# Patient Record
Sex: Male | Born: 1965
Health system: Southern US, Community
[De-identification: ages and names within clinical notes are randomized; demographics above are authoritative.]

## PROBLEM LIST (undated history)

## (undated) DIAGNOSIS — G8929 Other chronic pain: Secondary | ICD-10-CM

## (undated) DIAGNOSIS — F32A Depression, unspecified: Secondary | ICD-10-CM

## (undated) DIAGNOSIS — G709 Myoneural disorder, unspecified: Secondary | ICD-10-CM

## (undated) DIAGNOSIS — F419 Anxiety disorder, unspecified: Secondary | ICD-10-CM

## (undated) DIAGNOSIS — T7840XA Allergy, unspecified, initial encounter: Secondary | ICD-10-CM

## (undated) DIAGNOSIS — F200 Paranoid schizophrenia: Secondary | ICD-10-CM

## (undated) DIAGNOSIS — H409 Unspecified glaucoma: Secondary | ICD-10-CM

## (undated) DIAGNOSIS — F329 Major depressive disorder, single episode, unspecified: Secondary | ICD-10-CM

## (undated) HISTORY — DX: Anxiety disorder, unspecified: F41.9

## (undated) HISTORY — DX: Allergy, unspecified, initial encounter: T78.40XA

## (undated) HISTORY — PX: EPIDURAL BLOCK INJECTION: SHX1516

## (undated) HISTORY — DX: Myoneural disorder, unspecified: G70.9

## (undated) HISTORY — DX: Unspecified glaucoma: H40.9

## (undated) HISTORY — DX: Paranoid schizophrenia: F20.0

## (undated) HISTORY — PX: ANKLE ARTHROSCOPY: SUR85

---

## 2010-08-29 HISTORY — PX: OTHER SURGICAL HISTORY: SHX169

## 2010-09-02 ENCOUNTER — Ambulatory Visit
Admission: RE | Admit: 2010-09-02 | Discharge: 2010-09-02 | Payer: Self-pay | Source: Home / Self Care | Attending: Orthopedic Surgery | Admitting: Orthopedic Surgery

## 2010-09-02 LAB — GLUCOSE, CAPILLARY
Glucose-Capillary: 95 mg/dL (ref 70–99)
Glucose-Capillary: 83 mg/dL (ref 70–99)

## 2010-09-02 LAB — POCT HEMOGLOBIN-HEMACUE: Hemoglobin: 14.3 g/dL (ref 13.0–17.0)

## 2010-11-08 LAB — BASIC METABOLIC PANEL
BUN: 10 mg/dL (ref 6–23)
CO2: 25 mEq/L (ref 19–32)
Calcium: 9.4 mg/dL (ref 8.4–10.5)
Chloride: 109 mEq/L (ref 96–112)
Creatinine, Ser: 1.06 mg/dL (ref 0.4–1.5)
GFR calc Af Amer: >60 mL/min (ref >60)
GFR calc non Af Amer: >60 mL/min (ref >60)
Glucose, Bld: 62 mg/dL — ABNORMAL LOW (ref 70–99)
Potassium: 4 mEq/L (ref 3.5–5.1)
Sodium: 141 mEq/L (ref 135–145)

## 2011-01-27 ENCOUNTER — Ambulatory Visit: Payer: Medicare Other | Attending: Diagnostic Neuroimaging

## 2011-01-27 DIAGNOSIS — IMO0001 Reserved for inherently not codable concepts without codable children: Secondary | ICD-10-CM | POA: Insufficient documentation

## 2011-01-27 DIAGNOSIS — M545 Low back pain, unspecified: Secondary | ICD-10-CM | POA: Insufficient documentation

## 2011-01-27 DIAGNOSIS — M2569 Stiffness of other specified joint, not elsewhere classified: Secondary | ICD-10-CM | POA: Insufficient documentation

## 2011-02-01 ENCOUNTER — Ambulatory Visit: Payer: Medicare Other | Attending: Diagnostic Neuroimaging | Admitting: Physical Therapy

## 2011-02-01 DIAGNOSIS — M2569 Stiffness of other specified joint, not elsewhere classified: Secondary | ICD-10-CM | POA: Insufficient documentation

## 2011-02-01 DIAGNOSIS — M545 Low back pain, unspecified: Secondary | ICD-10-CM | POA: Insufficient documentation

## 2011-02-01 DIAGNOSIS — IMO0001 Reserved for inherently not codable concepts without codable children: Secondary | ICD-10-CM | POA: Insufficient documentation

## 2011-02-03 ENCOUNTER — Ambulatory Visit: Payer: Medicare Other

## 2011-02-07 ENCOUNTER — Ambulatory Visit: Payer: Medicare Other

## 2011-02-09 ENCOUNTER — Ambulatory Visit: Payer: Medicare Other | Admitting: Physical Therapy

## 2011-02-14 ENCOUNTER — Ambulatory Visit: Payer: Medicare Other | Admitting: Physical Therapy

## 2011-02-16 ENCOUNTER — Ambulatory Visit: Payer: Medicare Other | Admitting: Physical Therapy

## 2011-03-01 ENCOUNTER — Ambulatory Visit: Payer: Medicare Other | Attending: Diagnostic Neuroimaging

## 2011-03-01 DIAGNOSIS — M545 Low back pain, unspecified: Secondary | ICD-10-CM | POA: Insufficient documentation

## 2011-03-01 DIAGNOSIS — IMO0001 Reserved for inherently not codable concepts without codable children: Secondary | ICD-10-CM | POA: Insufficient documentation

## 2011-03-04 ENCOUNTER — Ambulatory Visit: Payer: Medicare Other

## 2011-03-08 ENCOUNTER — Ambulatory Visit: Payer: Medicare Other

## 2011-03-11 ENCOUNTER — Ambulatory Visit: Payer: Medicare Other

## 2011-03-21 ENCOUNTER — Encounter: Payer: Medicare Other | Admitting: Physical Therapy

## 2011-03-21 ENCOUNTER — Ambulatory Visit: Payer: Medicare Other

## 2011-03-22 ENCOUNTER — Encounter: Payer: Medicare Other | Admitting: Physical Therapy

## 2011-06-29 ENCOUNTER — Ambulatory Visit: Payer: Medicare Other | Attending: Orthopedic Surgery | Admitting: Physical Therapy

## 2011-06-29 DIAGNOSIS — M25669 Stiffness of unspecified knee, not elsewhere classified: Secondary | ICD-10-CM | POA: Insufficient documentation

## 2011-06-29 DIAGNOSIS — IMO0001 Reserved for inherently not codable concepts without codable children: Secondary | ICD-10-CM | POA: Insufficient documentation

## 2011-06-29 DIAGNOSIS — M25569 Pain in unspecified knee: Secondary | ICD-10-CM | POA: Insufficient documentation

## 2011-07-01 ENCOUNTER — Ambulatory Visit: Payer: Medicare Other | Attending: Orthopedic Surgery | Admitting: Physical Therapy

## 2011-07-01 DIAGNOSIS — M25669 Stiffness of unspecified knee, not elsewhere classified: Secondary | ICD-10-CM | POA: Insufficient documentation

## 2011-07-01 DIAGNOSIS — IMO0001 Reserved for inherently not codable concepts without codable children: Secondary | ICD-10-CM | POA: Insufficient documentation

## 2011-07-01 DIAGNOSIS — M25569 Pain in unspecified knee: Secondary | ICD-10-CM | POA: Insufficient documentation

## 2011-07-05 ENCOUNTER — Ambulatory Visit: Payer: Medicare Other | Admitting: Physical Therapy

## 2011-07-07 ENCOUNTER — Ambulatory Visit: Payer: Medicare Other | Admitting: Physical Therapy

## 2011-07-11 ENCOUNTER — Ambulatory Visit: Payer: Medicare Other | Admitting: Physical Therapy

## 2011-07-15 ENCOUNTER — Ambulatory Visit: Payer: Medicare Other | Admitting: Physical Therapy

## 2011-08-19 ENCOUNTER — Ambulatory Visit: Payer: Medicare Other | Admitting: Physical Medicine & Rehabilitation

## 2011-08-19 ENCOUNTER — Encounter: Payer: Medicare Other | Attending: Physical Medicine & Rehabilitation

## 2011-08-19 DIAGNOSIS — E119 Type 2 diabetes mellitus without complications: Secondary | ICD-10-CM | POA: Insufficient documentation

## 2011-08-19 DIAGNOSIS — R209 Unspecified disturbances of skin sensation: Secondary | ICD-10-CM | POA: Insufficient documentation

## 2011-08-19 NOTE — Consult Note (Signed)
REASON FOR VISIT:  Bilateral arm and hand numbness.  Consult requested by Dr. Teressa Senter.  HISTORY:  A 45 year old male who had problems initially with right upper extremity numbness in the hand and the forearm area as well as elbow starting the last week in February 2011.  He had EMG and CV showing ulnar nerve compression at the cubital tunnel.  Median nerves were reported as normal.  There is no evidence of polyneuropathy at that time.  This was done in 2011.  He eventually underwent a decompression of the right ulnar nerve without transposition on September 02, 2010.  He had no postoperative complications.  He had normal strength and sensation, started developing left-sided ulnar type symptoms, small finger numbness and now the patient states this has progressed over time to involve the whole hand.  The patient is a diabetic, but states his hemoglobin A1c are in the normal range now.  At 1 point, they were around 8 in 2010, but gradually regained a good control.  He has undergone neurological evaluation.  I found him B12 level slightly low at 177, start on B12 injections.  He states his pain is about 4/10 described as sharp, stabbing and tingling pain.  This is mainly episodic and has to do with position of his arms, certainly hyperflexion of his elbows provokes this.  He also states that using a laptop will do this, but if he has his ergonomic set up on his desktop, he can be on the computer a bit longer.  The patient is a self-admitted game addict, but no longer does video gaming.  He used to spend many hours a day on the computer.  MEDICATIONS:  Nabumetone, glipizide, metformin, Geodon, pravastatin, Lyrica, Wellbutrin, clonazepam and Singulair.  ALLERGIES:  AMOXICILLIN.  PSYCHIATRIC HISTORY:  Positive for depression, anxiety and paranoia.  He has a Therapist, sports.  In addition, he is on disability for this.  He has been on psychiatric disability since 2003.  He has not worked jobs  since June 18, 2011.  REVIEW OF SYSTEMS:  Also, positive for some low blood sugars.  He states that he has gotten "control of his diabetes."  SOCIAL HISTORY:  He is single, living with his mother.  OBJECTIVE:  Blood pressure 113/69, pulse 82, respiratory rate 16 and O2 sat 94% on room air.  Weight 210 pounds.  Height 5 feet 11 inches.  He states at 1 point, he was about 230 or maybe even 240 pounds. Examination, in general, no acute distress.  Mood and affect appropriate, but does appear anxious.  His neck range of motion is full without pain.  His upper extremity strength is normal.  His upper extremity range of motion normal in the upper extremity.  Deep tendon reflexes are normal.  He has no tenderness over the ulnar groove.  No tenderness over the wrist except over the distal radial aspect.  He has a mildly positive Finkelstein maneuver bilaterally.  He has hypertrophy of his thumb extensor muscles in the distal forearm of the right upper extremity.  His lower extremities have normal sensation, normal strength, normal deep tendon reflexes.  His gait is normal.  His back has no tenderness to palpation.  He has full lumbar spine range of motion.  IMPRESSION:  Upper extremity paresthesias.  Certainly, it sounds suspicious for ulnar neuropathy at the elbow.  This appears to be mainly positional in nature, but very distressing to the patient.  Given his history of diabetes, he is also at risk for  median nerve compression at the wrist and certainly he has not had a EMG and CV since July 19, 2011.  In addition, he has some lower extremity symptomatology such as numbness in the toes mainly in the sural distribution, but no objective findings.  Certainly, we could check nerve conductions in the lower extremity as well.  RECOMMENDATION: 1. He has been on Lyrica for a couple years.  We will wean him off of     it starting at 100 b.i.d. for a week and then 100 daily for a week.      In the meantime, we will start him on gabapentin 300 mg at bedtime     for a week, b.i.d. for a week, t.i.d. for a week, then q.i.d.  I     went over the side effects. 2. Overall, I do not think he is a good candidate for narcotic     analgesic medications.  First of all, his pain seems to be more     mechanical and positional.  Second of all, he is on multiple     psychiatric medications, which may interact. 3. Discussed my recommendations with the patient, he agrees with the     plan. 4. If EMG is not revealing, he may consider neuromuscular ultrasound     to see if he has got any subluxation of these ulnar nerve out of     the ulnar groove or for her to have some impingement of the triceps     tendon into the ulnar groove.     Erick Colace, M.D. Electronically Signed    [facility]K/Med[facility] D:08/19/2011 14:11:47  T:08/19/2011 21:36:36  Job #:  [id number]  cc:   Katy Fitch. Sypher, M.D. Fax: [phone]

## 2011-09-19 ENCOUNTER — Encounter: Payer: Medicare Other | Attending: Physical Medicine & Rehabilitation

## 2011-09-19 ENCOUNTER — Ambulatory Visit: Payer: Medicare Other | Admitting: Physical Medicine & Rehabilitation

## 2011-09-19 DIAGNOSIS — R209 Unspecified disturbances of skin sensation: Secondary | ICD-10-CM | POA: Insufficient documentation

## 2011-09-19 DIAGNOSIS — E119 Type 2 diabetes mellitus without complications: Secondary | ICD-10-CM | POA: Insufficient documentation

## 2011-09-28 ENCOUNTER — Other Ambulatory Visit: Payer: Self-pay | Admitting: Physical Medicine & Rehabilitation

## 2011-09-28 DIAGNOSIS — M5410 Radiculopathy, site unspecified: Secondary | ICD-10-CM

## 2011-10-04 ENCOUNTER — Ambulatory Visit (HOSPITAL_COMMUNITY)
Admission: RE | Admit: 2011-10-04 | Discharge: 2011-10-04 | Disposition: A | Discharge status: Home / Self Care | Payer: Medicare Other | Source: Ambulatory Visit | Attending: Physical Medicine & Rehabilitation | Admitting: Physical Medicine & Rehabilitation

## 2011-10-04 DIAGNOSIS — R209 Unspecified disturbances of skin sensation: Secondary | ICD-10-CM | POA: Insufficient documentation

## 2011-10-04 DIAGNOSIS — M5410 Radiculopathy, site unspecified: Secondary | ICD-10-CM

## 2011-10-18 ENCOUNTER — Telehealth: Payer: Self-pay | Admitting: *Deleted

## 2011-10-18 NOTE — Telephone Encounter (Signed)
Patient called 10/17/11 asking should he have surgery on his left elbow? MD answered yes since it helped his right elbow. Asked why is his left foot numb. MD answered not sure, would require some additional testing to determine. Left patient msg to [facility]. No specific info left on msg due to "generic" VM

## 2011-10-19 ENCOUNTER — Telehealth: Payer: Self-pay | Admitting: *Deleted

## 2011-10-19 ENCOUNTER — Telehealth: Payer: Self-pay | Admitting: Physical Medicine & Rehabilitation

## 2011-10-19 NOTE — Telephone Encounter (Signed)
Pt aware that he needs to call the doctor that did his previous surgery on his R elbow.

## 2011-10-19 NOTE — Telephone Encounter (Signed)
Also need to arrange surgery on left elbow.

## 2011-10-19 NOTE — Telephone Encounter (Signed)
Should he have surgery on L elbow? What causes numbness in L foot?  Dr. Wynn Banker was consulted on this and he says that surgery was be benifical because it helped his R elbow. He is not sure what would cause his numbness in his foot, additional testing would need to find out what's going on. Pt aware of this information.

## 2011-11-14 ENCOUNTER — Ambulatory Visit (HOSPITAL_BASED_OUTPATIENT_CLINIC_OR_DEPARTMENT_OTHER): Payer: Medicare Other | Admitting: Physical Medicine & Rehabilitation

## 2011-11-14 ENCOUNTER — Encounter: Payer: Medicare Other | Attending: Physical Medicine & Rehabilitation

## 2011-11-14 ENCOUNTER — Encounter: Payer: Self-pay | Admitting: Physical Medicine & Rehabilitation

## 2011-11-14 VITALS — BP 113/68 | HR 83 | Resp 16 | Ht 72.0 in | Wt 203.0 lb

## 2011-11-14 DIAGNOSIS — R209 Unspecified disturbances of skin sensation: Secondary | ICD-10-CM | POA: Insufficient documentation

## 2011-11-14 DIAGNOSIS — G5620 Lesion of ulnar nerve, unspecified upper limb: Secondary | ICD-10-CM

## 2011-11-14 DIAGNOSIS — E119 Type 2 diabetes mellitus without complications: Secondary | ICD-10-CM | POA: Insufficient documentation

## 2011-11-14 MED ORDER — GABAPENTIN 400 MG PO CAPS: 400.0000 mg | ORAL_CAPSULE | Freq: Four times a day (QID) | ORAL | Status: DC

## 2011-11-14 NOTE — Patient Instructions (Signed)
Gabapentin capsules or tablets What is this medicine? GABAPENTIN (GA ba pen tin) is used to control partial seizures in adults with epilepsy. It is also used to treat certain types of nerve pain. This medicine may be used for other purposes; ask your health care provider or pharmacist if you have questions. What should I tell my health care provider before I take this medicine? They need to know if you have any of these conditions: -kidney disease -suicidal thoughts, plans, or attempt; a previous suicide attempt by you or a family member -an unusual or allergic reaction to gabapentin, other medicines, foods, dyes, or preservatives -pregnant or trying to get pregnant -breast-feeding How should I use this medicine? Take this medicine by mouth. Swallow it with a drink of water. Follow the directions on the prescription label. If this medicine upsets your stomach, take it with food or milk. Take your medicine at regular intervals. Do not take it more often than directed. If you are directed to break the 600 or 800 mg tablets in half as part of your dose, the extra half tablet should be used for the next dose. If you have not used the extra half tablet within 3 days, it should be thrown away. A special MedGuide will be given to you by the pharmacist with each prescription and refill. Be sure to read this information carefully each time. Talk to your pediatrician regarding the use of this medicine in children. Special care may be needed. Overdosage: If you think you have taken too much of this medicine contact a poison control center or emergency room at once. NOTE: This medicine is only for you. Do not share this medicine with others. What if I miss a dose? If you miss a dose, take it as soon as you can. If it is almost time for your next dose, take only that dose. Do not take double or extra doses. What may interact with this medicine? -antacids -hydrocodone -morphine -naproxen -sevelamer This  list may not describe all possible interactions. Give your health care provider a list of all the medicines, herbs, non-prescription drugs, or dietary supplements you use. Also tell them if you smoke, drink alcohol, or use illegal drugs. Some items may interact with your medicine. What should I watch for while using this medicine? Visit your doctor or health care professional for regular checks on your progress. You may want to keep a record at home of how you feel your condition is responding to treatment. You may want to share this information with your doctor or health care professional at each visit. You should contact your doctor or health care professional if your seizures get worse or if you have any new types of seizures. Do not stop taking this medicine or any of your seizure medicines unless instructed by your doctor or health care professional. Stopping your medicine suddenly can increase your seizures or their severity. Wear a medical identification bracelet or chain if you are taking this medicine for seizures, and carry a card that lists all your medications. You may get drowsy, dizzy, or have blurred vision. Do not drive, use machinery, or do anything that needs mental alertness until you know how this medicine affects you. To reduce dizzy or fainting spells, do not sit or stand up quickly, especially if you are an older patient. Alcohol can increase drowsiness and dizziness. Avoid alcoholic drinks. Your mouth may get dry. Chewing sugarless gum or sucking hard candy, and drinking plenty of water will help.   The use of this medicine may increase the chance of suicidal thoughts or actions. Pay special attention to how you are responding while on this medicine. Any worsening of mood, or thoughts of suicide or dying should be reported to your health care professional right away. Women who become pregnant while using this medicine may enroll in the [place] American Antiepileptic Drug Pregnancy Registry  by calling 1-888-233-2334. This registry collects information about the safety of antiepileptic drug use during pregnancy. What side effects may I notice from receiving this medicine? Side effects that you should report to your doctor or health care professional as soon as possible: -allergic reactions like skin rash, itching or hives, swelling of the face, lips, or tongue -worsening of mood, thoughts or actions of suicide or dying Side effects that usually do not require medical attention (report to your doctor or health care professional if they continue or are bothersome): -constipation -difficulty walking or controlling muscle movements -nausea -slurred speech -tremors -weight gain This list may not describe all possible side effects. Call your doctor for medical advice about side effects. You may report side effects to FDA at 1-800-FDA-1088. Where should I keep my medicine? Keep out of reach of children. Store at room temperature between 15 and 30 degrees C (59 and 86 degrees F). Throw away any unused medicine after the expiration date. NOTE: This sheet is a summary. It may not cover all possible information. If you have questions about this medicine, talk to your doctor, pharmacist, or health care provider.  2012, Elsevier/Gold Standard. (04/13/2010 6:06:26 PM) 

## 2011-11-14 NOTE — Progress Notes (Signed)
  Subjective:    Patient ID: Glenn Robertson, male    DOB: 08/30/1965, 46 y.o.   MRN: [id number]  HPI Patient still has bilateral hand numbness and left lateral foot numbness. EMG/MCV was normal in the lower extremity but showed some residual ulnar neuropathy in the right upper extremity and moderate left ulnar neuropathy. The cervical MRI showed no evidence of stenosis. Patient reports he had a lumbar MRI last summer but I do not have these results. He states he is on disability from his psychiatric illness but still takes classes at [facility] [employer] [facility]. does not feel he is getting much relief from his Neurontin at 300 mg.  Pain Inventory Average Pain 6 Pain Right Now 2 My pain is intermittent, constant, sharp, dull, tingling and aching  In the last 24 hours, has pain interfered with the following? General activity 7 Relation with others 3 Enjoyment of life 6 What TIME of day is your pain at its worst? All Day Sleep (in general) Fair  Pain is worse with: bending and some activites Pain improves with: rest and pacing activities Relief from Meds: 0  Mobility ability to climb steps?  yes do you drive?  yes  Function disabled: date disabled 2003  Neuro/Psych numbness tingling depression anxiety  Prior Studies Any changes since last visit?  no  Physicians involved in your care Any changes since last visit?  no       Review of Systems  Constitutional: Negative.   HENT: Negative.   Eyes: Negative.   Respiratory: Negative.   Cardiovascular: Negative.   Gastrointestinal: Negative.   Genitourinary: Negative.   Musculoskeletal: Negative.   Skin: Negative.   Neurological: Negative.   Hematological: Negative.   Psychiatric/Behavioral: Negative.        Objective:   Physical Exam  Constitutional: He is oriented to person, place, and time. He appears well-developed and well-nourished.  Musculoskeletal:       Left ankle: Normal.  Neurological: He is alert and oriented to person,  place, and time. He has normal strength. No sensory deficit.  Psychiatric: He has a normal mood and affect. His speech is normal and behavior is normal. Judgment and thought content normal. He exhibits abnormal remote memory.          Assessment & Plan:  1. Ulnar neuropathy left side greater than right side. He has undergone release of the right ulnar nerve in January 2012. Recent EMG showed that there is just a borderline slowing on the right side but moderate slowing on the left side.  2. Left foot numbness mainly along the lateral border. Will ask patient to get the MRI ordered by neurology last summer so I can review this. We'll schedule an appointment to go over this together.  3. Diabetes mellitus maintain strict control last EMG showed no evidence of neuropathy. 4. B12 deficiency continue supplementation. No signs of neuropathy on EMG 5. Chronic pain syndrome appears to be neuropathic, will increase his Neurontin to 400 mg 4 times per day.

## 2011-11-16 ENCOUNTER — Telehealth: Payer: Self-pay | Admitting: *Deleted

## 2011-11-16 NOTE — Telephone Encounter (Signed)
MRI lumbar spine report of 01/14/11 reviewed by Dr Wynn Banker. May benefit from left sacral-1 trans LESI. Questions answered. Will mail pamphlet to Glenn Robertson and await his decision to schedule LESI.

## 2011-11-18 ENCOUNTER — Telehealth: Payer: Self-pay | Admitting: Physical Medicine & Rehabilitation

## 2011-11-18 NOTE — Telephone Encounter (Signed)
Would like to go ahead with the shot Dr proposed.  Should he move it up or what?

## 2011-12-13 ENCOUNTER — Telehealth: Payer: Self-pay | Admitting: Physical Medicine & Rehabilitation

## 2011-12-13 NOTE — Telephone Encounter (Signed)
Pt aware that it depends on each individual case if there are complications or not.

## 2011-12-13 NOTE — Telephone Encounter (Signed)
What are the potential complications of this shot?

## 2011-12-15 ENCOUNTER — Encounter: Payer: Medicare Other | Attending: Physical Medicine & Rehabilitation

## 2011-12-15 ENCOUNTER — Encounter: Payer: Self-pay | Admitting: Physical Medicine & Rehabilitation

## 2011-12-15 ENCOUNTER — Ambulatory Visit (HOSPITAL_BASED_OUTPATIENT_CLINIC_OR_DEPARTMENT_OTHER): Payer: Medicare Other | Admitting: Physical Medicine & Rehabilitation

## 2011-12-15 DIAGNOSIS — IMO0002 Reserved for concepts with insufficient information to code with codable children: Secondary | ICD-10-CM | POA: Insufficient documentation

## 2011-12-15 DIAGNOSIS — E119 Type 2 diabetes mellitus without complications: Secondary | ICD-10-CM | POA: Insufficient documentation

## 2011-12-15 DIAGNOSIS — R209 Unspecified disturbances of skin sensation: Secondary | ICD-10-CM | POA: Insufficient documentation

## 2011-12-15 NOTE — Patient Instructions (Signed)
Epidural Steroid Injection Care After  Refer to this sheet in the next few weeks. These instructions provide you with information on caring for yourself after your procedure. Your caregiver may also give you more specific instructions. Your treatment has been planned according to current medical practices, but problems sometimes occur. Call your caregiver if you have any problems or questions after your procedure. HOME CARE INSTRUCTIONS   Avoid the use of heat on the injection site for a day.   Do not have a tub bath or soak in water for the rest of the day.   Remove the bandage on the next day.   Resume your normal activities on the next day.   Use ice packs or mild pain relievers to reduce the soreness around the injection site.   Follow up with your caregiver 7 to 10 days after the procedure.  SEEK MEDICAL CARE IF:   You develop a fever of more than 100.5 F (38.1 C).   You continue to have pain and soreness over the injection site even after taking medicines.   You develop significant nausea or vomiting.  SEEK IMMEDIATE MEDICAL CARE IF:   You have severe back pain, which is not relieved by medicines.   You develop severe headache, stiff neck, or sensitivity to light.   You develop any new numbness or weakness of your legs.   You lose control over your bladder or bowel movements.   You develop a fever of more than 102 F (38.9 C).   You develop difficulty breathing.  Document Released: 11/30/2010 Document Revised: 08/04/2011 Document Reviewed: 11/30/2010 ExitCare Patient Information 2012 [employer]. 

## 2011-12-15 NOTE — Progress Notes (Signed)
Lumbar transforaminal epidural steroid injection under fluoroscopic guidance the left S1  Indication: Lumbosacral radiculitis is not relieved by medication management or other conservative care and interfering with self-care and mobility.   Informed consent was obtained after describing risk and benefits of the procedure with the patient, this includes bleeding, bruising, infection, paralysis and medication side effects.  The patient wishes to proceed and has given written consent.  Patient was placed in prone position.  The lumbar area was marked and prepped with Betadine.  It was entered with a 25-gauge 1-1/2 inch needle and one mL of 1% lidocaine was injected into the skin and subcutaneous tissue.  Then a 22-gauge 3.5 inch spinal needle was inserted into the left S1  sacral foramen under AP, lateral, and oblique view.  Then a solution containing one mL of 10 mg per mL dexamethasone and 2 mL of 1% lidocaine was injected.  The patient tolerated procedure well.  Post procedure instructions were given.  Please see post procedure form.

## 2011-12-15 NOTE — Progress Notes (Signed)
  PROCEDURE RECORD [facility]   Name: Glenn Robertson DOB:31-Dec-1965 MRN: [id number]  Date:12/15/2011  Physician: Claudette Laws, MD    Nurse/CMA: Kelli Churn, CMA (AAMA)  Allergies:  Allergies  Allergen Reactions  . Amoxicillin     Consent Signed: yes  Is patient diabetic? yes   Pregnant: no LMP: No LMP for male patient. (age 45-55)  Anticoagulants: no Anti-inflammatory: no Antibiotics: no  Procedure: LESI   Position: Prone Start Time: 2:21 pm  End Time: 2:25 pm  Fluoro Time: 9  RN/CMA Derward Marple Carroll,CMA    Time 1:42 pm 2:28 pm    BP 117/76 118/61    Pulse 96 92    Respirations 14 16    O2 Sat 96 99    S/S 6 6    Pain Level 8 8     D/C home with Glenn Robertson (mother), patient A & O X 3, D/C instructions reviewed, and sits independently.

## 2011-12-19 ENCOUNTER — Telehealth: Payer: Self-pay | Admitting: *Deleted

## 2011-12-19 NOTE — Telephone Encounter (Signed)
Pt was not in ER on the 21st. He states that he didn't have to run after his 22 month old nephew and he thinks this numbness may be coming from that.

## 2011-12-19 NOTE — Telephone Encounter (Signed)
Had an ESI on 12/15/11. Having numbness in L foot, more than usual. Is this normal?

## 2011-12-19 NOTE — Telephone Encounter (Signed)
I see where the patient was in the emergency department on 12/18/2011 with a seizure. This certainly could have aggravated his lumbar radiculopathy. The epidural injection does not typically cause increased numbness other than temporarily while the lidocaine is working. That would be in the first few hours after the injection. If the patient has any increased weakness I conservatively reevaluate him this week.

## 2011-12-26 ENCOUNTER — Telehealth: Payer: Self-pay

## 2011-12-26 ENCOUNTER — Telehealth: Payer: Self-pay | Admitting: Physical Medicine & Rehabilitation

## 2011-12-26 NOTE — Telephone Encounter (Signed)
Spoke with pt advised him to not over do his PT and to maybe wait a little longer before he gets back on his bike.  He will call if the pain does not get better to be re-evaluated.

## 2011-12-26 NOTE — Telephone Encounter (Signed)
Patient sates he had set back on Friday, he was moving something he should not have moved.  Is he able to do PT prior to injection?  Can he ride his bicycle?

## 2012-01-03 ENCOUNTER — Telehealth: Payer: Self-pay | Admitting: Physical Medicine & Rehabilitation

## 2012-01-03 NOTE — Telephone Encounter (Signed)
I would recommend Physical therapy.  Please let me know if he wants to pursue

## 2012-01-03 NOTE — Telephone Encounter (Signed)
Having problem with back and numbness in foot.  It won't go away.  Is there any hope for him?  Please call.

## 2012-01-03 NOTE — Telephone Encounter (Signed)
Please advise 

## 2012-01-04 NOTE — Telephone Encounter (Signed)
Pt aware of Dr. Wynn Banker recommendations

## 2012-01-10 ENCOUNTER — Encounter: Payer: Medicare Other | Attending: Physical Medicine & Rehabilitation

## 2012-01-10 ENCOUNTER — Ambulatory Visit: Payer: Medicare Other | Admitting: Physical Medicine & Rehabilitation

## 2012-01-10 ENCOUNTER — Encounter: Payer: Self-pay | Admitting: Physical Medicine & Rehabilitation

## 2012-01-10 ENCOUNTER — Ambulatory Visit (HOSPITAL_BASED_OUTPATIENT_CLINIC_OR_DEPARTMENT_OTHER): Payer: Medicare Other | Admitting: Physical Medicine & Rehabilitation

## 2012-01-10 VITALS — BP 114/52 | HR 104 | Resp 16 | Ht 69.25 in | Wt 206.0 lb

## 2012-01-10 DIAGNOSIS — R209 Unspecified disturbances of skin sensation: Secondary | ICD-10-CM | POA: Insufficient documentation

## 2012-01-10 DIAGNOSIS — E119 Type 2 diabetes mellitus without complications: Secondary | ICD-10-CM | POA: Insufficient documentation

## 2012-01-10 DIAGNOSIS — M5430 Sciatica, unspecified side: Secondary | ICD-10-CM

## 2012-01-10 NOTE — Progress Notes (Signed)
  Subjective:    Patient ID: Glenn Robertson, male    DOB: 1965/12/14, 46 y.o.   MRN: [id number]  HPI Left lateral foot pain improved for about one week after S1 injection. He feels like he has a similar problem in the right foot. He's had no new injuries. He is scheduled for an ulnar nerve release with Dr. Cleone Slim Cypher on 01/19/2012. He like to exercise more. We discussed exercise plans including bicycling which is more low impact as well as a jog walk alternating regimen. He prefers running to biking. Pain Inventory Average Pain "hard to say-depends on what I am doing" Pain Right Now 2 My pain is intermittent, sharp, dull and tingling  In the last 24 hours, has pain interfered with the following? General activity 5 Relation with others 5 Enjoyment of life 8 What TIME of day is your pain at its worst? can be at any time Sleep (in general) Fair  Pain is worse with: some activites Pain improves with: rest and pacing activities Relief from Meds: some  Mobility walk without assistance  Function disabled: date disabled 2003  Neuro/Psych depression anxiety  Prior Studies Any changes since last visit?  no  Physicians involved in your care Any changes since last visit?  no      Review of Systems  Constitutional:       High blood sugar  All other systems reviewed and are negative.       Objective:   Physical Exam  Musculoskeletal:       Lumbar back: Normal. He exhibits no tenderness and no deformity.  Neurological: He has normal strength. He displays no atrophy. No sensory deficit. He exhibits normal muscle tone. Gait normal.  Psychiatric: His affect is blunt and inappropriate. He is slowed.      Assessment & Plan:  1. Chronic low back pain he has some symptoms suggestive of S1 nerve root compression. He was seen by neurology last year and an MRI showed a bulging disc although I do not have that actual report. 2. Ulnar nerve paresthesias has hadright ulnar nerve release  one year ago. He is scheduled for left ulnar nerve release in 1 week. He will followup with his surgeon postoperatively. I'll see the patient back in 2 months. We discussed exercise programs If his lower extremity discomfort progresses over the next couple months we will order an MRI. I would like him to recover from his ulnar nerve surgery he has had no bowel or bladder complaints no weakness

## 2012-01-10 NOTE — Patient Instructions (Addendum)
I will see you back in 2 months to assess your back and leg pain. If it has gotten worse we will order an MRI at that time. Good luck with your ulnar nerve surgery I think you can resume a jog walk regimen. You can start out with walking 5 minutes jogging for 1 minute and then adding 1 minutes 2 year jogging times each week. I would recommend not exceeding 5 days per week. Every other day may be better in your situation

## 2012-01-16 ENCOUNTER — Encounter (HOSPITAL_BASED_OUTPATIENT_CLINIC_OR_DEPARTMENT_OTHER)
Admission: RE | Admit: 2012-01-16 | Discharge: 2012-01-16 | Disposition: A | Discharge status: Home / Self Care | Payer: Medicare Other | Source: Ambulatory Visit | Attending: Orthopedic Surgery | Admitting: Orthopedic Surgery

## 2012-01-16 ENCOUNTER — Other Ambulatory Visit: Payer: Self-pay | Admitting: Orthopedic Surgery

## 2012-01-16 ENCOUNTER — Encounter (HOSPITAL_BASED_OUTPATIENT_CLINIC_OR_DEPARTMENT_OTHER): Payer: Self-pay | Admitting: *Deleted

## 2012-01-16 LAB — BASIC METABOLIC PANEL
BUN: 12 mg/dL (ref 6–23)
CO2: 25 mEq/L (ref 19–32)
Calcium: 9.2 mg/dL (ref 8.4–10.5)
Chloride: 104 mEq/L (ref 96–112)
Creatinine, Ser: 0.95 mg/dL (ref 0.50–1.35)
GFR calc Af Amer: >90 mL/min (ref >90)
GFR calc non Af Amer: >90 mL/min (ref >90)
Glucose, Bld: 156 mg/dL — ABNORMAL HIGH (ref 70–99)
Potassium: 3.6 mEq/L (ref 3.5–5.1)
Sodium: 139 mEq/L (ref 135–145)

## 2012-01-16 NOTE — Progress Notes (Signed)
To come in for [id number] Had rt ulnar nerve last yr

## 2012-01-18 NOTE — H&P (Signed)
  Glenn Robertson is an 46 y.o. male.   Chief Complaint: c/o chronic and progressive numbness and tingling left hand ulnar distribution HPI: .  Glenn Robertson is a 46 year-old disabled gentleman who has had type II diabetes for eleven years.  He reports that his last hemoglobin A1C was 6.1. He has neuropathy in his feet.  He has had numbness in both hands aggravated by use of his cell phone.  He has recently moved from [place], [place] to [place].  He has had some neck soreness and stiffness, but no radicular type symptoms.  He has full motion of his cervical spine. He underwent surgical decompression of his right ulnar nerve about one year ago and did well post-op. On initial evaluation he was found to have significant ulnar nerve compression at is elbow bilaterally. He now requests surgery to the left elbow due to continued symptoms.  Past Medical History  Diagnosis Date  . Diabetes mellitus   . Neuromuscular disorder   . Allergy   . Asthma   . Chronic pain   . Depression     Past Surgical History  Procedure Date  . Arm surgery 1/12    rt ulnar nerve decompression  . Epidural block injection     several    Family History  Problem Relation Age of Onset  . Diabetes Father   . Diabetes Paternal Uncle    Social History:  reports that he has never smoked. He has never used smokeless tobacco. He reports that he does not drink alcohol or use illicit drugs.  Allergies:  Allergies  Allergen Reactions  . Amoxicillin     No prescriptions prior to admission    No results found for this or any previous visit (from the past 48 hour(s)).  No results found.   Pertinent items are noted in HPI.  There were no vitals taken for this visit.  General appearance: alert Head: Normocephalic, without obvious abnormality Neck: supple, symmetrical, trachea midline Resp: clear to auscultation bilaterally Cardio: regular rate and rhythm GI: normal findings: bowel sounds normal Extremities:  Examination of the left elbow reveals a positive Tinel's and positive hyperflexion test. He has mildly weak intrinsic strength when compared with the right side. There is no atrophy noted. Review of his nerve conduction studies reveals delayed onset latencies and slowed conduction velocities across the cubital tunnel. Pulses: 2+ and symmetric Skin: normal Neurologic: Grossly normal    Assessment/Plan Impression: Left elbow ulnar nerve compression  Plan: To the OR for ulnar nerve decompression vs.transposition left elbow. The procedure, risks and post-op course were discussed with the patient at length and he was in agreement with the plan.  DASNOIT,Corinn Stoltzfus J 01/18/2012, 5:02 PM    H&P documentation: 01/19/2012  -History and Physical Reviewed  -Patient has been re-examined  -No change in the plan of care  Wyn Forster, MD

## 2012-01-19 ENCOUNTER — Encounter (HOSPITAL_BASED_OUTPATIENT_CLINIC_OR_DEPARTMENT_OTHER): Payer: Self-pay | Admitting: Orthopedic Surgery

## 2012-01-19 ENCOUNTER — Encounter (HOSPITAL_BASED_OUTPATIENT_CLINIC_OR_DEPARTMENT_OTHER)
Admission: RE | Disposition: A | Discharge status: Home / Self Care | Payer: Self-pay | Source: Ambulatory Visit | Attending: Orthopedic Surgery

## 2012-01-19 ENCOUNTER — Encounter (HOSPITAL_BASED_OUTPATIENT_CLINIC_OR_DEPARTMENT_OTHER): Payer: Self-pay | Admitting: Certified Registered"

## 2012-01-19 ENCOUNTER — Ambulatory Visit (HOSPITAL_BASED_OUTPATIENT_CLINIC_OR_DEPARTMENT_OTHER)
Admission: RE | Admit: 2012-01-19 | Discharge: 2012-01-19 | Disposition: A | Discharge status: Home / Self Care | Payer: Medicare Other | Source: Ambulatory Visit | Attending: Orthopedic Surgery | Admitting: Orthopedic Surgery

## 2012-01-19 ENCOUNTER — Encounter (HOSPITAL_BASED_OUTPATIENT_CLINIC_OR_DEPARTMENT_OTHER): Payer: Self-pay | Admitting: *Deleted

## 2012-01-19 ENCOUNTER — Ambulatory Visit (HOSPITAL_BASED_OUTPATIENT_CLINIC_OR_DEPARTMENT_OTHER): Payer: Medicare Other | Admitting: Certified Registered"

## 2012-01-19 DIAGNOSIS — Z01810 Encounter for preprocedural cardiovascular examination: Secondary | ICD-10-CM | POA: Insufficient documentation

## 2012-01-19 DIAGNOSIS — G5620 Lesion of ulnar nerve, unspecified upper limb: Secondary | ICD-10-CM | POA: Insufficient documentation

## 2012-01-19 DIAGNOSIS — Z01812 Encounter for preprocedural laboratory examination: Secondary | ICD-10-CM | POA: Insufficient documentation

## 2012-01-19 DIAGNOSIS — E119 Type 2 diabetes mellitus without complications: Secondary | ICD-10-CM | POA: Insufficient documentation

## 2012-01-19 DIAGNOSIS — J45909 Unspecified asthma, uncomplicated: Secondary | ICD-10-CM | POA: Insufficient documentation

## 2012-01-19 HISTORY — DX: Depression, unspecified: F32.A

## 2012-01-19 HISTORY — DX: Other chronic pain: G89.29

## 2012-01-19 HISTORY — DX: Major depressive disorder, single episode, unspecified: F32.9

## 2012-01-19 HISTORY — PX: ULNAR NERVE TRANSPOSITION: SHX2595

## 2012-01-19 LAB — GLUCOSE, CAPILLARY: Glucose-Capillary: 150 mg/dL — ABNORMAL HIGH (ref 70–99)

## 2012-01-19 LAB — POCT HEMOGLOBIN-HEMACUE: Hemoglobin: 13.6 g/dL (ref 13.0–17.0)

## 2012-01-19 SURGERY — ULNAR NERVE DECOMPRESSION/TRANSPOSITION
Anesthesia: General | Site: Elbow | Laterality: Left | Wound class: Clean

## 2012-01-19 MED ORDER — OXYCODONE-ACETAMINOPHEN 5-325 MG PO TABS: 1.0000 | ORAL_TABLET | ORAL | Status: AC | PRN

## 2012-01-19 MED ORDER — LIDOCAINE HCL 2 % IJ SOLN
INTRAMUSCULAR | Status: DC | PRN
  Administered 2012-01-19: 4 mL

## 2012-01-19 MED ORDER — CHLORHEXIDINE GLUCONATE 4 % EX LIQD: 60.0000 mL | Freq: Once | CUTANEOUS | Status: DC

## 2012-01-19 MED ORDER — PROPOFOL 10 MG/ML IV EMUL
INTRAVENOUS | Status: DC | PRN
  Administered 2012-01-19: 275 mg via INTRAVENOUS

## 2012-01-19 MED ORDER — MIDAZOLAM HCL 5 MG/5ML IJ SOLN
INTRAMUSCULAR | Status: DC | PRN
  Administered 2012-01-19: 2 mg via INTRAVENOUS

## 2012-01-19 MED ORDER — ACETAMINOPHEN 10 MG/ML IV SOLN
1000.0000 mg | Freq: Once | INTRAVENOUS | Status: AC
  Administered 2012-01-19: 1000 mg via INTRAVENOUS

## 2012-01-19 MED ORDER — OXYCODONE-ACETAMINOPHEN 5-325 MG PO TABS
1.0000 | ORAL_TABLET | Freq: Once | ORAL | Status: AC
  Administered 2012-01-19: 1 via ORAL

## 2012-01-19 MED ORDER — HYDROMORPHONE HCL PF 1 MG/ML IJ SOLN: 0.2500 mg | INTRAMUSCULAR | Status: DC | PRN

## 2012-01-19 MED ORDER — METOCLOPRAMIDE HCL 5 MG/ML IJ SOLN: 10.0000 mg | Freq: Once | INTRAMUSCULAR | Status: DC | PRN

## 2012-01-19 MED ORDER — FENTANYL CITRATE 0.05 MG/ML IJ SOLN
INTRAMUSCULAR | Status: DC | PRN
  Administered 2012-01-19: 100 ug via INTRAVENOUS

## 2012-01-19 MED ORDER — ONDANSETRON HCL 4 MG/2ML IJ SOLN
INTRAMUSCULAR | Status: DC | PRN
  Administered 2012-01-19: 4 mg via INTRAVENOUS

## 2012-01-19 MED ORDER — DEXAMETHASONE SODIUM PHOSPHATE 10 MG/ML IJ SOLN
INTRAMUSCULAR | Status: DC | PRN
  Administered 2012-01-19: 4 mg via INTRAVENOUS

## 2012-01-19 MED ORDER — LIDOCAINE HCL (CARDIAC) 20 MG/ML IV SOLN
INTRAVENOUS | Status: DC | PRN
  Administered 2012-01-19: 80 mg via INTRAVENOUS

## 2012-01-19 MED ORDER — METOCLOPRAMIDE HCL 5 MG/ML IJ SOLN
INTRAMUSCULAR | Status: DC | PRN
  Administered 2012-01-19: 10 mg via INTRAVENOUS

## 2012-01-19 MED ORDER — LACTATED RINGERS IV SOLN
INTRAVENOUS | Status: DC
Start: 2012-01-19 — End: 2012-01-19
  Administered 2012-01-19 (×2): via INTRAVENOUS

## 2012-01-19 MED ORDER — CEFAZOLIN SODIUM 1-5 GM-% IV SOLN
INTRAVENOUS | Status: DC | PRN
  Administered 2012-01-19: 2 g via INTRAVENOUS

## 2012-01-19 SURGICAL SUPPLY — 44 items
BANDAGE ADHESIVE 1X3 (GAUZE/BANDAGES/DRESSINGS) IMPLANT
BANDAGE ELASTIC 4 VELCRO ST LF (GAUZE/BANDAGES/DRESSINGS) ×2 IMPLANT
BLADE MINI RND TIP GREEN BEAV (BLADE) IMPLANT
BLADE SURG 15 STRL LF DISP TIS (BLADE) ×1 IMPLANT
BLADE SURG 15 STRL SS (BLADE) ×1
BNDG ESMARK 4X9 LF (GAUZE/BANDAGES/DRESSINGS) ×2 IMPLANT
BRUSH SCRUB EZ PLAIN DRY (MISCELLANEOUS) ×2 IMPLANT
CLOTH BEACON ORANGE TIMEOUT ST (SAFETY) ×2 IMPLANT
CORDS BIPOLAR (ELECTRODE) ×2 IMPLANT
COVER MAYO STAND STRL (DRAPES) ×2 IMPLANT
COVER TABLE BACK 60X90 (DRAPES) ×2 IMPLANT
CUFF TOURNIQUET SINGLE 18IN (TOURNIQUET CUFF) ×2 IMPLANT
DECANTER SPIKE VIAL GLASS SM (MISCELLANEOUS) IMPLANT
DRAPE EXTREMITY T 121X128X90 (DRAPE) ×2 IMPLANT
DRAPE SURG 17X23 STRL (DRAPES) ×2 IMPLANT
DRSG TEGADERM 4X4.75 (GAUZE/BANDAGES/DRESSINGS) ×2 IMPLANT
GAUZE SPONGE 4X4 12PLY STRL LF (GAUZE/BANDAGES/DRESSINGS) ×2 IMPLANT
GLOVE BIO SURGEON STRL SZ7 (GLOVE) ×2 IMPLANT
GLOVE BIO SURGEON STRL SZ7.5 (GLOVE) ×2 IMPLANT
GLOVE BIOGEL M STRL SZ7.5 (GLOVE) ×2 IMPLANT
GLOVE BIOGEL PI IND STRL 7.5 (GLOVE) ×2 IMPLANT
GLOVE BIOGEL PI INDICATOR 7.5 (GLOVE) ×2
GLOVE ORTHO TXT STRL SZ7.5 (GLOVE) ×2 IMPLANT
GOWN PREVENTION PLUS XLARGE (GOWN DISPOSABLE) ×2 IMPLANT
GOWN PREVENTION PLUS XXLARGE (GOWN DISPOSABLE) IMPLANT
LOOP VESSEL MAXI BLUE (MISCELLANEOUS) ×2 IMPLANT
NEEDLE 27GAX1X1/2 (NEEDLE) ×2 IMPLANT
PACK BASIN DAY SURGERY FS (CUSTOM PROCEDURE TRAY) ×2 IMPLANT
PADDING CAST ABS 4INX4YD NS (CAST SUPPLIES) ×1
PADDING CAST ABS COTTON 4X4 ST (CAST SUPPLIES) ×1 IMPLANT
SLEEVE SCD COMPRESS KNEE MED (MISCELLANEOUS) IMPLANT
SPONGE GAUZE 4X4 12PLY (GAUZE/BANDAGES/DRESSINGS) ×2 IMPLANT
STOCKINETTE 4X48 STRL (DRAPES) ×2 IMPLANT
STRIP CLOSURE SKIN 1/2X4 (GAUZE/BANDAGES/DRESSINGS) ×2 IMPLANT
SUT PROLENE 3 0 PS 2 (SUTURE) ×2 IMPLANT
SUT VIC AB 3-0 X1 27 (SUTURE) IMPLANT
SUT VIC AB 4-0 P-3 18XBRD (SUTURE) IMPLANT
SUT VIC AB 4-0 P3 18 (SUTURE)
SYR 3ML 23GX1 SAFETY (SYRINGE) IMPLANT
SYR BULB 3OZ (MISCELLANEOUS) IMPLANT
SYR CONTROL 10ML LL (SYRINGE) IMPLANT
TOWEL OR 17X24 6PK STRL BLUE (TOWEL DISPOSABLE) ×4 IMPLANT
UNDERPAD 30X30 INCONTINENT (UNDERPADS AND DIAPERS) ×2 IMPLANT
WATER STERILE IRR 1000ML POUR (IV SOLUTION) IMPLANT

## 2012-01-19 NOTE — Anesthesia Procedure Notes (Signed)
Procedure Name: LMA Insertion Date/Time: 01/19/2012 9:18 AM Performed by: Verlan Friends Pre-anesthesia Checklist: Patient identified, Emergency Drugs available, Suction available, Patient being monitored and Timeout performed Patient Re-evaluated:Patient Re-evaluated prior to inductionOxygen Delivery Method: Circle System Utilized Preoxygenation: Pre-oxygenation with 100% oxygen Intubation Type: IV induction Ventilation: Mask ventilation without difficulty LMA: LMA with gastric port inserted LMA Size: 5.0 Number of attempts: 1 Placement Confirmation: positive ETCO2 Tube secured with: Tape Dental Injury: Teeth and Oropharynx as per pre-operative assessment

## 2012-01-19 NOTE — Op Note (Signed)
DICTATED [id number]

## 2012-01-19 NOTE — Discharge Instructions (Signed)

## 2012-01-19 NOTE — Anesthesia Preprocedure Evaluation (Signed)
Anesthesia Evaluation  Patient identified by MRN, date of birth, ID band Patient awake    Reviewed: Allergy & Precautions, H&P , NPO status , Patient's Chart, lab work & pertinent test results, reviewed documented beta blocker date and time   Airway Mallampati: II TM Distance: >3 FB Neck ROM: full    Dental   Pulmonary asthma ,          Cardiovascular negative cardio ROS      Neuro/Psych PSYCHIATRIC DISORDERS  Neuromuscular disease    GI/Hepatic negative GI ROS, Neg liver ROS,   Endo/Other  Diabetes mellitus-, Oral Hypoglycemic Agents  Renal/GU negative Renal ROS  negative genitourinary   Musculoskeletal   Abdominal   Peds  Hematology negative hematology ROS (+)   Anesthesia Other Findings See surgeon's H&P   Reproductive/Obstetrics negative OB ROS                           Anesthesia Physical Anesthesia Plan  ASA: II  Anesthesia Plan: General   Post-op Pain Management:    Induction: Intravenous  Airway Management Planned: LMA  Additional Equipment:   Intra-op Plan:   Post-operative Plan: Extubation in OR  Informed Consent: I have reviewed the patients History and Physical, chart, labs and discussed the procedure including the risks, benefits and alternatives for the proposed anesthesia with the patient or authorized representative who has indicated his/her understanding and acceptance.   Dental Advisory Given  Plan Discussed with: CRNA and Surgeon  Anesthesia Plan Comments:         Anesthesia Quick Evaluation

## 2012-01-19 NOTE — Transfer of Care (Signed)
Immediate Anesthesia Transfer of Care Note  Patient: Glenn Robertson  Procedure(s) Performed: Procedure(s) (LRB): ULNAR NERVE DECOMPRESSION/TRANSPOSITION (Left)  Patient Location: PACU  Anesthesia Type: General  Level of Consciousness: sedated and unresponsive  Airway & Oxygen Therapy: Patient Spontanous Breathing and Patient connected to face mask oxygen  Post-op Assessment: Report given to PACU RN and Post -op Vital signs reviewed and stable  Post vital signs: Reviewed and stable  Complications: No apparent anesthesia complications

## 2012-01-19 NOTE — Brief Op Note (Signed)
01/19/2012  10:18 AM  PATIENT:  Glenn Robertson  46 y.o. male  PRE-OPERATIVE DIAGNOSIS:  left ulna nerve compression, instability and background diabetes  POST-OPERATIVE DIAGNOSIS:  left ulna nerve compression, instability and background diabetes  PROCEDURE:   ULNAR NERVE DECOMPRESSION/TRANSPOSITION LEFT ELBOW  SURGEON: Wyn Forster., MD   PHYSICIAN ASSISTANT:   ASSISTANTS: Mallory Shirk.A-C   ANESTHESIA:   general  EBL:  Total I/O In: 200 [I.V.:200] Out: -   BLOOD ADMINISTERED:none  DRAINS: none   LOCAL MEDICATIONS USED:  XYLOCAINE   SPECIMEN:  No Specimen  DISPOSITION OF SPECIMEN:  N/A  COUNTS:  YES  TOURNIQUET:   Total Tourniquet Time Documented: Upper Arm (Left) - 58 minutes  DICTATION: .Other Dictation: Dictation Number [id number]  PLAN OF CARE: Discharge to home after PACU  PATIENT DISPOSITION:  PACU - hemodynamically stable.
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
## 2012-01-24 ENCOUNTER — Encounter (HOSPITAL_BASED_OUTPATIENT_CLINIC_OR_DEPARTMENT_OTHER): Payer: Self-pay | Admitting: Orthopedic Surgery

## 2012-02-10 ENCOUNTER — Encounter (HOSPITAL_BASED_OUTPATIENT_CLINIC_OR_DEPARTMENT_OTHER): Payer: Self-pay

## 2012-02-10 ENCOUNTER — Other Ambulatory Visit: Payer: Self-pay | Admitting: Physical Medicine & Rehabilitation

## 2012-03-05 ENCOUNTER — Encounter: Payer: Self-pay | Admitting: Physical Medicine & Rehabilitation

## 2012-03-05 ENCOUNTER — Encounter: Payer: Medicare Other | Attending: Physical Medicine & Rehabilitation

## 2012-03-05 ENCOUNTER — Ambulatory Visit (HOSPITAL_BASED_OUTPATIENT_CLINIC_OR_DEPARTMENT_OTHER): Payer: Medicare Other | Admitting: Physical Medicine & Rehabilitation

## 2012-03-05 VITALS — BP 103/64 | HR 90 | Resp 14 | Ht 69.0 in | Wt 209.0 lb

## 2012-03-05 DIAGNOSIS — IMO0002 Reserved for concepts with insufficient information to code with codable children: Secondary | ICD-10-CM

## 2012-03-05 DIAGNOSIS — M722 Plantar fascial fibromatosis: Secondary | ICD-10-CM

## 2012-03-05 DIAGNOSIS — G5620 Lesion of ulnar nerve, unspecified upper limb: Secondary | ICD-10-CM

## 2012-03-05 DIAGNOSIS — R209 Unspecified disturbances of skin sensation: Secondary | ICD-10-CM | POA: Insufficient documentation

## 2012-03-05 DIAGNOSIS — E119 Type 2 diabetes mellitus without complications: Secondary | ICD-10-CM | POA: Insufficient documentation

## 2012-03-05 NOTE — Progress Notes (Signed)
Subjective:    Patient ID: Glenn Robertson, male    DOB: 10-06-1965, 46 y.o.   MRN: [id number]  HPI Left lateral foot pain improved for about one week after S1 injection. He feels like he has a similar problem in the right foot. He's had no new injuries. He is scheduled for an ulnar nerve release with Dr. Cleone Slim Cypher on 01/19/2012. He like to exercise more. We discussed exercise plans including bicycling which is more low impact as well as a jog walk alternating regimen. He prefers running to biking.  Pain Inventory Average Pain 6 Pain Right Now 2 My pain is tingling and aching  In the last 24 hours, has pain interfered with the following? General activity 8 Relation with others 4 Enjoyment of life 10 What TIME of day is your pain at its worst? n/a Sleep (in general) Poor  Pain is worse with: some activites Pain improves with: rest, pacing activities and medication Relief from Meds: 5  Mobility ability to climb steps?  yes do you drive?  yes Do you have any goals in this area?  no  Function disabled: date disabled 2003 Do you have any goals in this area?  yes  Neuro/Psych depression anxiety  Prior Studies Any changes since last visit?  no  Physicians involved in your care Any changes since last visit?  no   Family History  Problem Relation Age of Onset  . Diabetes Father   . Diabetes Paternal Uncle    History   Social History  . Marital Status: Single    Spouse Name: N/A    Number of Children: N/A  . Years of Education: N/A   Social History Main Topics  . Smoking status: Never Smoker   . Smokeless tobacco: Never Used  . Alcohol Use: No  . Drug Use: No  . Sexually Active: None   Other Topics Concern  . None   Social History Narrative  . None   Past Surgical History  Procedure Date  . Arm surgery 1/12    rt ulnar nerve decompression  . Epidural block injection     several  . Ulnar nerve transposition 01/19/2012    Procedure: ULNAR NERVE  DECOMPRESSION/TRANSPOSITION;  Surgeon: Wyn Forster., MD;  Location: [facility];  Service: Orthopedics;  Laterality: Left;  ulnar nerve decompression vs transposition left elbow   Past Medical History  Diagnosis Date  . Diabetes mellitus   . Neuromuscular disorder   . Allergy   . Asthma   . Chronic pain   . Depression    BP 103/64  Pulse 90  Resp 14  Ht 5\' 9"  (1.753 m)  Wt 209 lb (94.802 kg)  BMI 30.86 kg/m2  SpO2 94%     Review of Systems  Musculoskeletal: Positive for myalgias and arthralgias.  Psychiatric/Behavioral: Positive for dysphoric mood. The patient is nervous/anxious.   All other systems reviewed and are negative.       Objective:   Physical Exam  Constitutional: He is oriented to person, place, and time. He appears well-developed and well-nourished.  Musculoskeletal:       Right foot: He exhibits tenderness.       Left foot: He exhibits tenderness.       Tenderness over bilateral plantar surface of the heel. No pain with stretching of the plantar fascia. No tenderness over the Achilles tendon. Range of motion is normal Numbness on the lateral border of the left foot only Straight leg raising test  is negative  Neurological: He is alert and oriented to person, place, and time.  Psychiatric: He has a normal mood and affect.    Left elbow scar well-healed      Assessment & Plan:  1. Chronic left S1 radiculitis mild. I have encouraged him to exercise either doing exercise bike or walking. 2. Bilateral plantar fasciitis he like to resume running but given this diagnosis I don't think now is the time to do this. I think you do fine with walking once he gets on a stretching program we'll have instruction on that today. 3. Bilateral ulnar neuropathy status post recent left ulnar nerve surgery will instruct ulnar glide exercises. Over half the 25 minute visit was spent counseling coronation care instructing in exercise. Patient had  questions answered.

## 2012-03-05 NOTE — Patient Instructions (Signed)
Plantar Fasciitis (Heel Spur Syndrome) with Rehab The plantar fascia is a fibrous, ligament-like, soft-tissue structure that spans the bottom of the foot. Plantar fasciitis is a condition that causes pain in the foot due to inflammation of the tissue. SYMPTOMS   Pain and tenderness on the underneath side of the foot.   Pain that worsens with standing or walking.  CAUSES  Plantar fasciitis is caused by irritation and injury to the plantar fascia on the underneath side of the foot. Common mechanisms of injury include:  Direct trauma to bottom of the foot.   Damage to a small nerve that runs under the foot where the main fascia attaches to the heel bone.   Stress placed on the plantar fascia due to bone spurs.  RISK INCREASES WITH:   Activities that place stress on the plantar fascia (running, jumping, pivoting, or cutting).   Poor strength and flexibility.   Improperly fitted shoes.   Tight calf muscles.   Flat feet.   Failure to warm-up properly before activity.   Obesity.  PREVENTION  Warm up and stretch properly before activity.   Allow for adequate recovery between workouts.   Maintain physical fitness:   Strength, flexibility, and endurance.   Cardiovascular fitness.   Maintain a health body weight.   Avoid stress on the plantar fascia.   Wear properly fitted shoes, including arch supports for individuals who have flat feet.  PROGNOSIS  If treated properly, then the symptoms of plantar fasciitis usually resolve without surgery. However, occasionally surgery is necessary. RELATED COMPLICATIONS   Recurrent symptoms that may result in a chronic condition.   Problems of the lower back that are caused by compensating for the injury, such as limping.   Pain or weakness of the foot during push-off following surgery.   Chronic inflammation, scarring, and partial or complete fascia tear, occurring more often from repeated injections.  TREATMENT  Treatment  initially involves the use of ice and medication to help reduce pain and inflammation. The use of strengthening and stretching exercises may help reduce pain with activity, especially stretches of the Achilles tendon. These exercises may be performed at home or with a therapist. Your caregiver may recommend that you use heel cups of arch supports to help reduce stress on the plantar fascia. Occasionally, corticosteroid injections are given to reduce inflammation. If symptoms persist for greater than 6 months despite non-surgical (conservative), then surgery may be recommended.  MEDICATION   If pain medication is necessary, then nonsteroidal anti-inflammatory medications, such as aspirin and ibuprofen, or other minor pain relievers, such as acetaminophen, are often recommended.   Do not take pain medication within 7 days before surgery.   Prescription pain relievers may be given if deemed necessary by your caregiver. Use only as directed and only as much as you need.   Corticosteroid injections may be given by your caregiver. These injections should be reserved for the most serious cases, because they may only be given a certain number of times.  HEAT AND COLD  Cold treatment (icing) relieves pain and reduces inflammation. Cold treatment should be applied for 10 to 15 minutes every 2 to 3 hours for inflammation and pain and immediately after any activity that aggravates your symptoms. Use ice packs or massage the area with a piece of ice (ice massage).   Heat treatment may be used prior to performing the stretching and strengthening activities prescribed by your caregiver, physical therapist, or athletic trainer. Use a heat pack or soak the   injury in warm water.  SEEK IMMEDIATE MEDICAL CARE IF:  Treatment seems to offer no benefit, or the condition worsens.   Any medications produce adverse side effects.  EXERCISES RANGE OF MOTION (ROM) AND STRETCHING EXERCISES - Plantar Fasciitis (Heel Spur  Syndrome) These exercises may help you when beginning to rehabilitate your injury. Your symptoms may resolve with or without further involvement from your physician, physical therapist or athletic trainer. While completing these exercises, remember:   Restoring tissue flexibility helps normal motion to return to the joints. This allows healthier, less painful movement and activity.   An effective stretch should be held for at least 30 seconds.   A stretch should never be painful. You should only feel a gentle lengthening or release in the stretched tissue.  RANGE OF MOTION - Toe Extension, Flexion  Sit with your right / left leg crossed over your opposite knee.   Grasp your toes and gently pull them back toward the top of your foot. You should feel a stretch on the bottom of your toes and/or foot.   Hold this stretch for __________ seconds.   Now, gently pull your toes toward the bottom of your foot. You should feel a stretch on the top of your toes and or foot.   Hold this stretch for __________ seconds.  Repeat __________ times. Complete this stretch __________ times per day.  RANGE OF MOTION - Ankle Dorsiflexion, Active Assisted  Remove shoes and sit on a chair that is preferably not on a carpeted surface.   Place right / left foot under knee. Extend your opposite leg for support.   Keeping your heel down, slide your right / left foot back toward the chair until you feel a stretch at your ankle or calf. If you do not feel a stretch, slide your bottom forward to the edge of the chair, while still keeping your heel down.   Hold this stretch for __________ seconds.  Repeat __________ times. Complete this stretch __________ times per day.  STRETCH - Gastroc, Standing  Place hands on wall.   Extend right / left leg, keeping the front knee somewhat bent.   Slightly point your toes inward on your back foot.   Keeping your right / left heel on the floor and your knee straight, shift  your weight toward the wall, not allowing your back to arch.   You should feel a gentle stretch in the right / left calf. Hold this position for __________ seconds.  Repeat __________ times. Complete this stretch __________ times per day. STRETCH - Soleus, Standing  Place hands on wall.   Extend right / left leg, keeping the other knee somewhat bent.   Slightly point your toes inward on your back foot.   Keep your right / left heel on the floor, bend your back knee, and slightly shift your weight over the back leg so that you feel a gentle stretch deep in your back calf.   Hold this position for __________ seconds.  Repeat __________ times. Complete this stretch __________ times per day. STRETCH - Gastrocsoleus, Standing  Note: This exercise can place a lot of stress on your foot and ankle. Please complete this exercise only if specifically instructed by your caregiver.   Place the ball of your right / left foot on a step, keeping your other foot firmly on the same step.   Hold on to the wall or a rail for balance.   Slowly lift your other foot, allowing your   body weight to press your heel down over the edge of the step.   You should feel a stretch in your right / left calf.   Hold this position for __________ seconds.   Repeat this exercise with a slight bend in your right / left knee.  Repeat __________ times. Complete this stretch __________ times per day.  STRENGTHENING EXERCISES - Plantar Fasciitis (Heel Spur Syndrome)  These exercises may help you when beginning to rehabilitate your injury. They may resolve your symptoms with or without further involvement from your physician, physical therapist or athletic trainer. While completing these exercises, remember:   Muscles can gain both the endurance and the strength needed for everyday activities through controlled exercises.   Complete these exercises as instructed by your physician, physical therapist or athletic trainer.  Progress the resistance and repetitions only as guided.  STRENGTH - Towel Curls  Sit in a chair positioned on a non-carpeted surface.   Place your foot on a towel, keeping your heel on the floor.   Pull the towel toward your heel by only curling your toes. Keep your heel on the floor.   If instructed by your physician, physical therapist or athletic trainer, add ____________________ at the end of the towel.  Repeat __________ times. Complete this exercise __________ times per day. STRENGTH - Ankle Inversion  Secure one end of a rubber exercise band/tubing to a fixed object (table, pole). Loop the other end around your foot just before your toes.   Place your fists between your knees. This will focus your strengthening at your ankle.   Slowly, pull your big toe up and in, making sure the band/tubing is positioned to resist the entire motion.   Hold this position for __________ seconds.   Have your muscles resist the band/tubing as it slowly pulls your foot back to the starting position.  Repeat __________ times. Complete this exercises __________ times per day.  Document Released: 08/15/2005 Document Revised: 08/04/2011 Document Reviewed: 11/27/2008 [employer] Patient Information 2012 [employer]. 

## 2012-03-30 ENCOUNTER — Encounter: Payer: Medicare Other | Admitting: Physical Medicine and Rehabilitation

## 2012-04-10 ENCOUNTER — Encounter: Payer: Self-pay | Admitting: Physical Medicine and Rehabilitation

## 2012-04-10 ENCOUNTER — Encounter
Payer: Medicare Other | Attending: Physical Medicine and Rehabilitation | Admitting: Physical Medicine and Rehabilitation

## 2012-04-10 VITALS — BP 108/67 | HR 85 | Resp 14 | Ht 69.0 in | Wt 211.2 lb

## 2012-04-10 DIAGNOSIS — IMO0002 Reserved for concepts with insufficient information to code with codable children: Secondary | ICD-10-CM | POA: Insufficient documentation

## 2012-04-10 DIAGNOSIS — G8929 Other chronic pain: Secondary | ICD-10-CM | POA: Insufficient documentation

## 2012-04-10 DIAGNOSIS — G5620 Lesion of ulnar nerve, unspecified upper limb: Secondary | ICD-10-CM | POA: Insufficient documentation

## 2012-04-10 DIAGNOSIS — E119 Type 2 diabetes mellitus without complications: Secondary | ICD-10-CM | POA: Insufficient documentation

## 2012-04-10 DIAGNOSIS — M79671 Pain in right foot: Secondary | ICD-10-CM

## 2012-04-10 DIAGNOSIS — M79641 Pain in right hand: Secondary | ICD-10-CM

## 2012-04-10 DIAGNOSIS — M722 Plantar fascial fibromatosis: Secondary | ICD-10-CM | POA: Insufficient documentation

## 2012-04-10 DIAGNOSIS — M79609 Pain in unspecified limb: Secondary | ICD-10-CM | POA: Insufficient documentation

## 2012-04-10 MED ORDER — DICLOFENAC SODIUM 1 % TD GEL: 1.0000 "application " | Freq: Four times a day (QID) | TRANSDERMAL | Status: DC

## 2012-04-10 NOTE — Patient Instructions (Signed)
Continue with riding the bike, continue with walking/running.

## 2012-04-10 NOTE — Progress Notes (Signed)
Subjective:    Patient ID: Glenn Robertson, male    DOB: 08/07/1966, 46 y.o.   MRN: [id number]  HPI The patient complains about chronic feet pain, worse on the left. The patient denies any radiation. The patient also complains about some mild pain in the 4th and fifth finger on the right.   The problem has been stable.  Pain Inventory Average Pain 3 Pain Right Now 3 My pain is constant, sharp, dull and tingling  In the last 24 hours, has pain interfered with the following? General activity 3 Relation with others 0 Enjoyment of life 8 What TIME of day is your pain at its worst? daytime evening and night Sleep (in general) Good  Pain is worse with: some activites Pain improves with: rest and pacing activities Relief from Meds: 3  Mobility walk without assistance  Function disabled: date disabled 2003  Neuro/Psych No problems in this area  Prior Studies Any changes since last visit?  no  Physicians involved in your care Any changes since last visit?  no   Family History  Problem Relation Age of Onset  . Diabetes Father   . Diabetes Paternal Uncle    History   Social History  . Marital Status: Single    Spouse Name: N/A    Number of Children: N/A  . Years of Education: N/A   Social History Main Topics  . Smoking status: Never Smoker   . Smokeless tobacco: Never Used  . Alcohol Use: No  . Drug Use: No  . Sexually Active: None   Other Topics Concern  . None   Social History Narrative  . None   Past Surgical History  Procedure Date  . Arm surgery 1/12    rt ulnar nerve decompression  . Epidural block injection     several  . Ulnar nerve transposition 01/19/2012    Procedure: ULNAR NERVE DECOMPRESSION/TRANSPOSITION;  Surgeon: Wyn Forster., MD;  Location: [facility];  Service: Orthopedics;  Laterality: Left;  ulnar nerve decompression vs transposition left elbow   Past Medical History  Diagnosis Date  . Diabetes mellitus   .  Neuromuscular disorder   . Allergy   . Asthma   . Chronic pain   . Depression    BP 108/67  Pulse 85  Resp 14  Ht 5\' 9"  (1.753 m)  Wt 211 lb 3.2 oz (95.8 kg)  BMI 31.19 kg/m2  SpO2 94%    Review of Systems  Musculoskeletal: Positive for back pain.       Pinky and ring finger on right hand  Neurological: Positive for numbness.       Tingling  Psychiatric/Behavioral: Positive for dysphoric mood. The patient is nervous/anxious.   All other systems reviewed and are negative.       Objective:   Physical Exam  Constitutional: He is oriented to person, place, and time. He appears well-developed and well-nourished.  HENT:  Head: Normocephalic.  Neck: Neck supple.  Musculoskeletal: He exhibits tenderness.  Neurological: He is alert and oriented to person, place, and time.  Skin: Skin is warm and dry.  Psychiatric: He has a normal mood and affect.    Symmetric normal motor tone is noted throughout. Normal muscle bulk. Muscle testing reveals 5/5 muscle strength of the upper extremity, and 5/5 of the lower extremity. Full range of motion in upper and lower extremities. ROM of spine is not restricted. Fine motor movements are normal in both hands. Sensory is intact and symmetric  to light touch, pinprick and proprioception. DTR in the upper and lower extremity are present and symmetric 1+. No clonus is noted.  Patient arises from chair without difficulty. Narrow based gait with normal arm swing bilateral , able to walk on heels and toes . Tandem walk is stable. No pronator drift. Rhomberg negative. Tenderness at right latissimus dorsi, and increased tension       Assessment & Plan:  1. Chronic left S1 radiculitis mild. I have encouraged him to exercise either doing exercise bike or walking.  2. Bilateral plantar fasciitis he like to resume running but given this diagnosis I don't think now is the time to do this. I think you do fine with walking once he gets on a stretching  program we'll have instruction on that today. Advised patient to continue ice massage with a frozen water bottle.  3. Bilateral ulnar neuropathy status post recent left ulnar nerve surgery will instruct ulnar glide exercises. Prescribed Voltaren gel for his finger pain on the right,( 4th and 5th finger), and for his elbow pain. Also showed him stretching exercises for his back and exercises to strengthen his core. Follow up in 2 month

## 2012-04-11 ENCOUNTER — Telehealth: Payer: Self-pay | Admitting: Physical Medicine & Rehabilitation

## 2012-04-11 NOTE — Telephone Encounter (Signed)
Glenn Robertson, Voltaren gel for arthritis in fingers.

## 2012-04-11 NOTE — Telephone Encounter (Signed)
Glenn Robertson had a question about whether or not he has arthritis in his fingers. I told him that she was prescribing it for the pain in his fingers and there is that possibility, but she would have to discuss this at the next visit. I told him voltaren gel is an anti inflammatory that would help with the pain that he is having as it is absorbed through the skin rather than taking an oral NSAID.

## 2012-05-14 ENCOUNTER — Other Ambulatory Visit: Payer: Self-pay | Admitting: Physical Medicine & Rehabilitation

## 2012-06-05 ENCOUNTER — Encounter: Payer: Self-pay | Admitting: Physical Medicine and Rehabilitation

## 2012-06-05 ENCOUNTER — Encounter
Payer: Medicare Other | Attending: Physical Medicine and Rehabilitation | Admitting: Physical Medicine and Rehabilitation

## 2012-06-05 VITALS — BP 102/62 | HR 78 | Resp 16 | Ht 70.0 in | Wt 211.0 lb

## 2012-06-05 DIAGNOSIS — M79671 Pain in right foot: Secondary | ICD-10-CM

## 2012-06-05 DIAGNOSIS — M545 Low back pain, unspecified: Secondary | ICD-10-CM | POA: Insufficient documentation

## 2012-06-05 DIAGNOSIS — IMO0002 Reserved for concepts with insufficient information to code with codable children: Secondary | ICD-10-CM | POA: Insufficient documentation

## 2012-06-05 DIAGNOSIS — G8929 Other chronic pain: Secondary | ICD-10-CM | POA: Insufficient documentation

## 2012-06-05 DIAGNOSIS — G5620 Lesion of ulnar nerve, unspecified upper limb: Secondary | ICD-10-CM | POA: Insufficient documentation

## 2012-06-05 DIAGNOSIS — M79609 Pain in unspecified limb: Secondary | ICD-10-CM

## 2012-06-05 DIAGNOSIS — M722 Plantar fascial fibromatosis: Secondary | ICD-10-CM | POA: Insufficient documentation

## 2012-06-05 MED ORDER — DICLOFENAC SODIUM 1 % TD GEL: 1.0000 "application " | Freq: Four times a day (QID) | TRANSDERMAL | Status: DC

## 2012-06-05 NOTE — Patient Instructions (Signed)
Continue with your exercises, continue with massaging your feet with an ice bottle.

## 2012-06-05 NOTE — Progress Notes (Signed)
Subjective:    Patient ID: Glenn Robertson, male    DOB: [birth year], 46 y.o.   MRN: [id number]  HPI The patient complains about chronic feet pain, worse on the left. The patient denies any radiation. The patient also complains about some mild pain in the 4th and fifth finger on the right. The patient also complains about mild LBP, after sitting at his computer for too long. The problem has been stable.   Pain Inventory Average Pain 3 Pain Right Now 2 My pain is constant, sharp, tingling and aching  In the last 24 hours, has pain interfered with the following? General activity 4 Relation with others 1 Enjoyment of life 5 What TIME of day is your pain at its worst? All Day Sleep (in general) Fair  Pain is worse with: inactivity and standing Pain improves with: rest Relief from Meds: 4  Mobility walk without assistance  Function Do you have any goals in this area?  no  Neuro/Psych numbness tingling  Prior Studies Any changes since last visit?  no  Physicians involved in your care Any changes since last visit?  no   Family History  Problem Relation Age of Onset  . Diabetes Father   . Diabetes Paternal Uncle    History   Social History  . Marital Status: Single    Spouse Name: N/A    Number of Children: N/A  . Years of Education: N/A   Social History Main Topics  . Smoking status: Never Smoker   . Smokeless tobacco: Never Used  . Alcohol Use: No  . Drug Use: No  . Sexually Active: None   Other Topics Concern  . None   Social History Narrative  . None   Past Surgical History  Procedure Date  . Arm surgery 1/12    rt ulnar nerve decompression  . Epidural block injection     several  . Ulnar nerve transposition 01/19/2012    Procedure: ULNAR NERVE DECOMPRESSION/TRANSPOSITION;  Surgeon: Wyn Forster., MD;  Location: [facility];  Service: Orthopedics;  Laterality: Left;  ulnar nerve decompression vs transposition left elbow   Past  Medical History  Diagnosis Date  . Diabetes mellitus   . Neuromuscular disorder   . Allergy   . Asthma   . Chronic pain   . Depression    BP 102/62  Pulse 78  Resp 16  Ht 5\' 10"  (1.778 m)  Wt 211 lb (95.709 kg)  BMI 30.28 kg/m2      Review of Systems  Constitutional: Negative.   HENT: Negative.   Eyes: Negative.   Respiratory: Negative.   Cardiovascular: Negative.   Gastrointestinal: Negative.   Genitourinary: Negative.   Musculoskeletal: Positive for myalgias and arthralgias.  Skin: Negative.   Neurological: Negative.   Hematological: Negative.   Psychiatric/Behavioral: Negative.        Objective:   Physical Exam Constitutional: He is oriented to person, place, and time. He appears well-developed and well-nourished.  HENT:  Head: Normocephalic.  Neck: Neck supple.  Musculoskeletal: He exhibits tenderness.  Neurological: He is alert and oriented to person, place, and time.  Skin: Skin is warm and dry.  Psychiatric: He has a normal mood and affect.   Symmetric normal motor tone is noted throughout. Normal muscle bulk. Muscle testing reveals 5/5 muscle strength of the upper extremity, and 5/5 of the lower extremity. Full range of motion in upper and lower extremities. ROM of spine is not restricted. Fine motor movements are  normal in both hands.  Sensory is intact and symmetric to light touch, pinprick and proprioception.  DTR in the upper and lower extremity are present and symmetric 1+. No clonus is noted.  Patient arises from chair without difficulty. Narrow based gait with normal arm swing bilateral , able to walk on heels and toes . Tandem walk is stable. No pronator drift. Rhomberg negative.  Tenderness at right latissimus dorsi, and increased tension        Assessment & Plan:  1. Chronic left S1 radiculitis mild. I have encouraged him to exercise either doing exercise bike or walking.  2. Bilateral plantar fasciitis, continue walking/running program.  Advised patient to continue ice massage with a frozen water bottle.  3. Bilateral ulnar neuropathy status post recent left ulnar nerve surgery, doing much better.  Refilled Voltaren gel for his finger pain on the right,( 4th and 5th finger), and for his elbow pain, which has given him good relief. Also showed him stretching exercises for his back and exercises to strengthen his core.  Follow up in 2 month

## 2012-06-11 ENCOUNTER — Ambulatory Visit: Payer: Medicare Other

## 2012-06-11 ENCOUNTER — Ambulatory Visit: Payer: Medicare Other | Admitting: Radiation Oncology

## 2012-08-03 ENCOUNTER — Ambulatory Visit: Payer: Medicare Other | Admitting: Physical Medicine and Rehabilitation

## 2012-09-04 ENCOUNTER — Encounter: Payer: Medicare Other | Admitting: Physical Medicine and Rehabilitation

## 2012-09-06 ENCOUNTER — Encounter: Payer: Self-pay | Admitting: Physical Medicine and Rehabilitation

## 2012-09-06 ENCOUNTER — Encounter
Payer: Medicare Other | Attending: Physical Medicine and Rehabilitation | Admitting: Physical Medicine and Rehabilitation

## 2012-09-06 VITALS — BP 131/79 | HR 107 | Resp 14 | Wt 217.8 lb

## 2012-09-06 DIAGNOSIS — R200 Anesthesia of skin: Secondary | ICD-10-CM

## 2012-09-06 DIAGNOSIS — E119 Type 2 diabetes mellitus without complications: Secondary | ICD-10-CM | POA: Insufficient documentation

## 2012-09-06 DIAGNOSIS — IMO0002 Reserved for concepts with insufficient information to code with codable children: Secondary | ICD-10-CM | POA: Insufficient documentation

## 2012-09-06 DIAGNOSIS — M545 Low back pain, unspecified: Secondary | ICD-10-CM | POA: Insufficient documentation

## 2012-09-06 DIAGNOSIS — M5430 Sciatica, unspecified side: Secondary | ICD-10-CM

## 2012-09-06 DIAGNOSIS — R209 Unspecified disturbances of skin sensation: Secondary | ICD-10-CM

## 2012-09-06 DIAGNOSIS — G5620 Lesion of ulnar nerve, unspecified upper limb: Secondary | ICD-10-CM | POA: Insufficient documentation

## 2012-09-06 DIAGNOSIS — M79609 Pain in unspecified limb: Secondary | ICD-10-CM | POA: Insufficient documentation

## 2012-09-06 DIAGNOSIS — M722 Plantar fascial fibromatosis: Secondary | ICD-10-CM | POA: Insufficient documentation

## 2012-09-06 NOTE — Patient Instructions (Signed)
Continue with your exercise program 

## 2012-09-06 NOTE — Progress Notes (Signed)
Subjective:    Patient ID: Glenn Robertson, male    DOB: 12-04-1965, 47 y.o.   MRN: [id number]  HPI The patient complains about chronic numbness and tingling of his feet, worse on the left. The patient denies any radiation. The patient also complains about some mild pain in the 4th and fifth finger on the right. The patient also complains about very mild LBP, after sitting at his computer for too long.  The problem has been stable.    Pain Inventory Average Pain 1 Pain Right Now 0 My pain is constant, dull and tingling  In the last 24 hours, has pain interfered with the following? General activity 3 Relation with others 0 Enjoyment of life 8 What TIME of day is your pain at its worst? all Sleep (in general) Fair  Pain is worse with: some activites Pain improves with: nothing Relief from Meds: no pain med  Mobility walk without assistance ability to climb steps?  yes do you drive?  yes  Function disabled: date disabled   Neuro/Psych bowel control problems numbness  Prior Studies Any changes since last visit?  no  Physicians involved in your care Any changes since last visit?  no   Family History  Problem Relation Age of Onset  . Diabetes Father   . Diabetes Paternal Uncle    History   Social History  . Marital Status: Single    Spouse Name: N/A    Number of Children: N/A  . Years of Education: N/A   Social History Main Topics  . Smoking status: Never Smoker   . Smokeless tobacco: Never Used  . Alcohol Use: No  . Drug Use: No  . Sexually Active: None   Other Topics Concern  . None   Social History Narrative  . None   Past Surgical History  Procedure Date  . Arm surgery 1/12    rt ulnar nerve decompression  . Epidural block injection     several  . Ulnar nerve transposition 01/19/2012    Procedure: ULNAR NERVE DECOMPRESSION/TRANSPOSITION;  Surgeon: Wyn Forster., MD;  Location: [facility];  Service: Orthopedics;  Laterality:  Left;  ulnar nerve decompression vs transposition left elbow   Past Medical History  Diagnosis Date  . Diabetes mellitus   . Neuromuscular disorder   . Allergy   . Asthma   . Chronic pain   . Depression    BP 131/79  Pulse 107  Resp 14  Wt 217 lb 12.8 oz (98.793 kg)  SpO2 94%   Review of Systems  Genitourinary:       Bladder control  Neurological: Positive for numbness.  All other systems reviewed and are negative.       Objective:   Physical Exam Constitutional: He is oriented to person, place, and time. He appears well-developed and well-nourished.  HENT:  Head: Normocephalic.  Neck: Neck supple.  Musculoskeletal: He exhibits tenderness.  Neurological: He is alert and oriented to person, place, and time.  Skin: Skin is warm and dry.  Psychiatric: He has a normal mood and affect.  Symmetric normal motor tone is noted throughout. Normal muscle bulk. Muscle testing reveals 5/5 muscle strength of the upper extremity, and 5/5 of the lower extremity. Full range of motion in upper and lower extremities. ROM of spine is not restricted. Fine motor movements are normal in both hands.  Sensory is intact and symmetric to light touch, pinprick and proprioception.  DTR in the upper and lower extremity  are present and symmetric 1+. No clonus is noted.  Patient arises from chair without difficulty. Narrow based gait with normal arm swing bilateral , able to walk on heels and toes . Tandem walk is stable. No pronator drift. Rhomberg negative.         Assessment & Plan:  1. Chronic left S1 radiculitis mild. I have encouraged him to exercise, showed him some strengthening exercises, right sitting posture and right lifting technic.  2. Bilateral plantar fasciitis, continue walking/running program. Advised patient to continue ice massage with a frozen water bottle.  3. Bilateral ulnar neuropathy status post recent left ulnar nerve surgery, doing much better.  Refilled Voltaren gel for  his finger pain on the right,( 4th and 5th finger), and for his elbow pain, which has given him good relief. Also showed him stretching exercises for his back and exercises to strengthen his core.  Follow up in 2 month

## 2012-09-16 ENCOUNTER — Other Ambulatory Visit: Payer: Self-pay | Admitting: Physical Medicine & Rehabilitation

## 2012-10-03 ENCOUNTER — Telehealth: Payer: Self-pay

## 2012-10-03 NOTE — Telephone Encounter (Signed)
As a general rule no machine where he moves his back in any way, but I can not tell him which machines/movements he can do over the phone, we can talk in detail when comes back

## 2012-10-03 NOTE — Telephone Encounter (Signed)
Patient has questions regarding what machines he should use at the gym.  He is not doing free weights at this time.  Please advise.

## 2012-10-04 ENCOUNTER — Telehealth: Payer: Self-pay

## 2012-10-04 NOTE — Telephone Encounter (Signed)
Patient informed that he should not use any machine that is related to or uses the back.  He will schedule appointment to discuss further with Clydie Braun.

## 2012-10-09 ENCOUNTER — Encounter: Payer: Self-pay | Admitting: Physical Medicine and Rehabilitation

## 2012-10-09 ENCOUNTER — Encounter
Payer: Medicare Other | Attending: Physical Medicine and Rehabilitation | Admitting: Physical Medicine and Rehabilitation

## 2012-10-09 VITALS — BP 121/64 | HR 104 | Resp 14 | Ht 70.0 in | Wt 218.0 lb

## 2012-10-09 DIAGNOSIS — M545 Low back pain, unspecified: Secondary | ICD-10-CM

## 2012-10-09 DIAGNOSIS — G5620 Lesion of ulnar nerve, unspecified upper limb: Secondary | ICD-10-CM | POA: Insufficient documentation

## 2012-10-09 DIAGNOSIS — M722 Plantar fascial fibromatosis: Secondary | ICD-10-CM | POA: Insufficient documentation

## 2012-10-09 DIAGNOSIS — IMO0002 Reserved for concepts with insufficient information to code with codable children: Secondary | ICD-10-CM | POA: Insufficient documentation

## 2012-10-09 DIAGNOSIS — R209 Unspecified disturbances of skin sensation: Secondary | ICD-10-CM | POA: Insufficient documentation

## 2012-10-09 NOTE — Progress Notes (Signed)
Subjective:    Patient ID: Glenn Robertson, male    DOB: October 18, 1965, 47 y.o.   MRN: [id number]  HPI The patient complains about chronic numbness and tingling of his feet, worse on the left. The patient denies any radiation. The patient also complains about some mild pain in the 4th and fifth finger on the right. The patient also complains about very mild LBP, after sitting at his computer for too long.  The problem has been stable. The patient is mainly here today to go through a list of exercises he would like to do in the gym. He wants me to tell him which ones are safe for him to do.  Pain Inventory Average Pain 1 Pain Right Now 3 My pain is sharp and dull  In the last 24 hours, has pain interfered with the following? General activity 5 Relation with others 0 Enjoyment of life 4 What TIME of day is your pain at its worst? morning, day and evening Sleep (in general) Fair  Pain is worse with: standing and some activites Pain improves with: medication Relief from Meds: n/a  Mobility Do you have any goals in this area?  no  Function Do you have any goals in this area?  no  Neuro/Psych bladder control problems  Prior Studies Any changes since last visit?  no  Physicians involved in your care Any changes since last visit?  no   Family History  Problem Relation Age of Onset  . Diabetes Father   . Diabetes Paternal Uncle    History   Social History  . Marital Status: Single    Spouse Name: N/A    Number of Children: N/A  . Years of Education: N/A   Social History Main Topics  . Smoking status: Never Smoker   . Smokeless tobacco: Never Used  . Alcohol Use: No  . Drug Use: No  . Sexually Active: None   Other Topics Concern  . None   Social History Narrative  . None   Past Surgical History  Procedure Laterality Date  . Arm surgery  1/12    rt ulnar nerve decompression  . Epidural block injection      several  . Ulnar nerve transposition  01/19/2012   Procedure: ULNAR NERVE DECOMPRESSION/TRANSPOSITION;  Surgeon: Wyn Forster., MD;  Location: [facility];  Service: Orthopedics;  Laterality: Left;  ulnar nerve decompression vs transposition left elbow   Past Medical History  Diagnosis Date  . Diabetes mellitus   . Neuromuscular disorder   . Allergy   . Asthma   . Chronic pain   . Depression    BP 121/64  Pulse 104  Resp 14  Ht 5\' 10"  (1.778 m)  Wt 218 lb (98.884 kg)  BMI 31.28 kg/m2  SpO2 99%     Review of Systems  All other systems reviewed and are negative.       Objective:   Physical Exam Constitutional: He is oriented to person, place, and time. He appears well-developed and well-nourished.  HENT:  Head: Normocephalic.  Neck: Neck supple.  Musculoskeletal: He exhibits tenderness.  Neurological: He is alert and oriented to person, place, and time.  Skin: Skin is warm and dry.  Psychiatric: He has a normal mood and affect.  Symmetric normal motor tone is noted throughout. Normal muscle bulk. Muscle testing reveals 5/5 muscle strength of the upper extremity, and 5/5 of the lower extremity. Full range of motion in upper and lower extremities. ROM  of spine is not restricted. Fine motor movements are normal in both hands.  Sensory is intact and symmetric to light touch, pinprick and proprioception.  DTR in the upper and lower extremity are present and symmetric 1+. No clonus is noted.  Patient arises from chair without difficulty. Narrow based gait with normal arm swing bilateral , able to walk on heels and toes . Tandem walk is stable. No pronator drift. Rhomberg negative.         Assessment & Plan:  1. Chronic left S1 radiculitis mild. I have encouraged him to exercise, showed him some strengthening exercises, right sitting posture and right lifting technic.  2. Bilateral plantar fasciitis, continue walking/running program. Advised patient to continue ice massage with a frozen water bottle.  3.  Bilateral ulnar neuropathy status post recent left ulnar nerve surgery, doing much better.  Refilled Voltaren gel for his finger pain on the right,( 4th and 5th finger), and for his elbow pain, which has given him good relief.  Went through a list of exercises he would like to do in the gym. I explained to him which ones are safe for him to do, I also demonstrated some exercises in the way he should do them to not hurt his lower back.  Also showed him stretching exercises for his back and exercises to strengthen his core.  Follow up in 2 month

## 2012-10-09 NOTE — Patient Instructions (Signed)
Try to do the exercises I recommended , not the once which might hurt your back as I explained to you.

## 2012-11-05 ENCOUNTER — Ambulatory Visit: Payer: Medicare Other | Admitting: Physical Medicine and Rehabilitation

## 2012-11-20 ENCOUNTER — Telehealth: Payer: Self-pay

## 2012-11-20 NOTE — Telephone Encounter (Signed)
He should take it easy for 3-5 days, just doing exercises which do not hurt him, he should not lift over 15 lbs, and make sure his back is in a safe position, by contracting his core muscles. He can also apply ice or heat, whatever gives him more relief. If it does not get better or he is in too much pain, he should make an appointment for evaluation and further treatment.

## 2012-11-20 NOTE — Telephone Encounter (Signed)
Patient informed to take it easy.  Also advised him to try heat and ice.  He will try these and will follow up if any other problems.

## 2012-11-20 NOTE — Telephone Encounter (Signed)
Patient called to say he hurt his back doing some exercises.  He wants to know if he should stop all exercising?  Also should he wear a belt when lifing?  Please advise.

## 2012-12-06 ENCOUNTER — Encounter: Payer: Self-pay | Admitting: Physical Medicine and Rehabilitation

## 2012-12-06 ENCOUNTER — Encounter
Payer: Medicare Other | Attending: Physical Medicine and Rehabilitation | Admitting: Physical Medicine and Rehabilitation

## 2012-12-06 VITALS — BP 109/71 | HR 87 | Resp 14 | Ht 70.0 in | Wt 218.0 lb

## 2012-12-06 DIAGNOSIS — E119 Type 2 diabetes mellitus without complications: Secondary | ICD-10-CM | POA: Insufficient documentation

## 2012-12-06 DIAGNOSIS — R209 Unspecified disturbances of skin sensation: Secondary | ICD-10-CM | POA: Insufficient documentation

## 2012-12-06 DIAGNOSIS — M722 Plantar fascial fibromatosis: Secondary | ICD-10-CM | POA: Insufficient documentation

## 2012-12-06 DIAGNOSIS — IMO0002 Reserved for concepts with insufficient information to code with codable children: Secondary | ICD-10-CM | POA: Insufficient documentation

## 2012-12-06 DIAGNOSIS — G8929 Other chronic pain: Secondary | ICD-10-CM

## 2012-12-06 DIAGNOSIS — G5620 Lesion of ulnar nerve, unspecified upper limb: Secondary | ICD-10-CM | POA: Insufficient documentation

## 2012-12-06 DIAGNOSIS — M545 Low back pain, unspecified: Secondary | ICD-10-CM | POA: Insufficient documentation

## 2012-12-06 DIAGNOSIS — M79609 Pain in unspecified limb: Secondary | ICD-10-CM | POA: Insufficient documentation

## 2012-12-06 NOTE — Patient Instructions (Addendum)
Continue with your walking/running program as pain permits.

## 2012-12-06 NOTE — Progress Notes (Signed)
Subjective:    Patient ID: Glenn Robertson, male    DOB: 1965-10-12, 47 y.o.   MRN: [id number]  HPI The patient complains about chronic numbness and tingling of his feet, worse on the left. The patient denies any radiation. The patient also complains about some mild pain in the 4th and fifth finger on the right. The patient also complains about very mild LBP, after sitting at his computer for too long.  The problem has been stable.   Pain Inventory Average Pain 3 Pain Right Now 3 My pain is sharp, dull and tingling  In the last 24 hours, has pain interfered with the following? General activity 4 Relation with others 1 Enjoyment of life 6 What TIME of day is your pain at its worst? all the time Sleep (in general) Good  Pain is worse with: standing and some activites Pain improves with: rest Relief from Meds: 5  Mobility Do you have any goals in this area?  no  Function Do you have any goals in this area?  no  Neuro/Psych bladder control problems numbness tingling  Prior Studies Any changes since last visit?  no  Physicians involved in your care Any changes since last visit?  no   Family History  Problem Relation Age of Onset  . Diabetes Father   . Diabetes Paternal Uncle    History   Social History  . Marital Status: Single    Spouse Name: N/A    Number of Children: N/A  . Years of Education: N/A   Social History Main Topics  . Smoking status: Never Smoker   . Smokeless tobacco: Never Used  . Alcohol Use: No  . Drug Use: No  . Sexually Active: None   Other Topics Concern  . None   Social History Narrative  . None   Past Surgical History  Procedure Laterality Date  . Arm surgery  1/12    rt ulnar nerve decompression  . Epidural block injection      several  . Ulnar nerve transposition  01/19/2012    Procedure: ULNAR NERVE DECOMPRESSION/TRANSPOSITION;  Surgeon: Wyn Forster., MD;  Location: [facility];  Service: Orthopedics;   Laterality: Left;  ulnar nerve decompression vs transposition left elbow   Past Medical History  Diagnosis Date  . Diabetes mellitus   . Neuromuscular disorder   . Allergy   . Asthma   . Chronic pain   . Depression    BP 109/71  Pulse 87  Resp 14  Ht 5\' 10"  (1.778 m)  Wt 218 lb (98.884 kg)  BMI 31.28 kg/m2  SpO2 99%     Review of Systems  Musculoskeletal: Positive for back pain.  Neurological: Positive for numbness.  All other systems reviewed and are negative.       Objective:   Physical Exam Constitutional: He is oriented to person, place, and time. He appears well-developed and well-nourished.  HENT:  Head: Normocephalic.  Neck: Neck supple.  Musculoskeletal: He exhibits tenderness.  Neurological: He is alert and oriented to person, place, and time.  Skin: Skin is warm and dry.  Psychiatric: He has a normal mood and affect.  Symmetric normal motor tone is noted throughout. Normal muscle bulk. Muscle testing reveals 5/5 muscle strength of the upper extremity, and 5/5 of the lower extremity. Full range of motion in upper and lower extremities. ROM of spine is not restricted. Fine motor movements are normal in both hands.  Sensory is intact and symmetric  to light touch, pinprick and proprioception.  DTR in the upper and lower extremity are present and symmetric 1+. No clonus is noted.  Patient arises from chair without difficulty. Narrow based gait with normal arm swing bilateral , able to walk on heels and toes . Tandem walk is stable. No pronator drift. Rhomberg negative.         Assessment & Plan:  1. Chronic left S1 radiculitis mild. I have encouraged him to exercise, showed him some strengthening exercises, right sitting posture and right lifting technic.  2. Bilateral plantar fasciitis, continue walking/running program. Advised patient to continue ice massage with a frozen water bottle.  3. Bilateral ulnar neuropathy status post recent left ulnar nerve  surgery, doing much better.  Continue Voltaren gel for his finger pain on the right,( 4th and 5th finger), and for his elbow pain, which has given him good relief.  4. Per patient, Hx of schizophrenia, paranoia, depression and anxiety, sees psychiatrist monthly, Dr. Lodema Hong in [place]. Went through a list of exercises he would like to do in the gym. I explained to him which ones are safe for him to do, I also demonstrated some exercises in the way he should do them to not hurt his lower back, again.  Also showed him stretching exercises for his back and exercises to strengthen his core, again.  25 min spent with patient face to face. Follow up in 3 month

## 2013-01-18 ENCOUNTER — Telehealth: Payer: Self-pay

## 2013-01-18 MED ORDER — GABAPENTIN 400 MG PO CAPS: ORAL_CAPSULE | ORAL | Status: DC

## 2013-01-18 NOTE — Telephone Encounter (Signed)
Gabapentin refilled

## 2013-01-18 NOTE — Telephone Encounter (Signed)
Gabapentin refill request.  Please advise 

## 2013-01-18 NOTE — Telephone Encounter (Signed)
Ok to refill 

## 2013-03-07 ENCOUNTER — Encounter
Payer: Medicare Other | Attending: Physical Medicine and Rehabilitation | Admitting: Physical Medicine and Rehabilitation

## 2013-03-07 ENCOUNTER — Encounter: Payer: Self-pay | Admitting: Physical Medicine and Rehabilitation

## 2013-03-07 VITALS — BP 119/64 | HR 101 | Resp 14 | Ht 70.0 in | Wt 218.0 lb

## 2013-03-07 DIAGNOSIS — F209 Schizophrenia, unspecified: Secondary | ICD-10-CM | POA: Insufficient documentation

## 2013-03-07 DIAGNOSIS — IMO0002 Reserved for concepts with insufficient information to code with codable children: Secondary | ICD-10-CM | POA: Insufficient documentation

## 2013-03-07 DIAGNOSIS — M722 Plantar fascial fibromatosis: Secondary | ICD-10-CM | POA: Insufficient documentation

## 2013-03-07 DIAGNOSIS — M47817 Spondylosis without myelopathy or radiculopathy, lumbosacral region: Secondary | ICD-10-CM

## 2013-03-07 DIAGNOSIS — G5620 Lesion of ulnar nerve, unspecified upper limb: Secondary | ICD-10-CM | POA: Insufficient documentation

## 2013-03-07 MED ORDER — DICLOFENAC SODIUM 1 % TD GEL: 2.0000 g | Freq: Four times a day (QID) | TRANSDERMAL | Status: DC

## 2013-03-07 NOTE — Progress Notes (Signed)
Subjective:    Patient ID: Glenn Robertson, male    DOB: October 23, 1965, 47 y.o.   MRN: [id number]  HPI The patient complains about chronic numbness and tingling of his feet, worse on the left. The patient denies any radiation. The patient also reports that the mild pain in the 4th and fifth finger on the right,  has resolved almost completely. The patient also complains about very mild LBP, after sitting at his computer for too long.  The problem has been stable.   Pain Inventory Average Pain 3 Pain Right Now 3 My pain is dull  In the last 24 hours, has pain interfered with the following? General activity 3 Relation with others 0 Enjoyment of life 6 What TIME of day is your pain at its worst? constant Sleep (in general) Fair  Pain is worse with: standing and some activites Pain improves with: rest Relief from Meds: 0  Mobility Do you have any goals in this area?  no  Function Do you have any goals in this area?  no  Neuro/Psych depression anxiety  Prior Studies Any changes since last visit?  no  Physicians involved in your care podiatrist, Dr. Stacie Acres   Family History  Problem Relation Age of Onset  . Diabetes Father   . Diabetes Paternal Uncle    History   Social History  . Marital Status: Single    Spouse Name: N/A    Number of Children: N/A  . Years of Education: N/A   Social History Main Topics  . Smoking status: Never Smoker   . Smokeless tobacco: Never Used  . Alcohol Use: No  . Drug Use: No  . Sexually Active: None   Other Topics Concern  . None   Social History Narrative  . None   Past Surgical History  Procedure Laterality Date  . Arm surgery  1/12    rt ulnar nerve decompression  . Epidural block injection      several  . Ulnar nerve transposition  01/19/2012    Procedure: ULNAR NERVE DECOMPRESSION/TRANSPOSITION;  Surgeon: Wyn Forster., MD;  Location: [facility];  Service: Orthopedics;  Laterality: Left;  ulnar nerve  decompression vs transposition left elbow   Past Medical History  Diagnosis Date  . Diabetes mellitus   . Neuromuscular disorder   . Allergy   . Asthma   . Chronic pain   . Depression    BP 119/64  Pulse 101  Resp 14  Ht 5\' 10"  (1.778 m)  Wt 218 lb (98.884 kg)  BMI 31.28 kg/m2  SpO2 96%     Review of Systems  Musculoskeletal: Positive for back pain.  Psychiatric/Behavioral: Positive for dysphoric mood. The patient is nervous/anxious.   All other systems reviewed and are negative.       Objective:   Physical Exam Constitutional: He is oriented to person, place, and time. He appears well-developed and well-nourished.  HENT:  Head: Normocephalic.  Neck: Neck supple.  Musculoskeletal: He exhibits tenderness.  Neurological: He is alert and oriented to person, place, and time.  Skin: Skin is warm and dry.  Psychiatric: He has a normal mood and affect.  Symmetric normal motor tone is noted throughout. Normal muscle bulk. Muscle testing reveals 5/5 muscle strength of the upper extremity, and 5/5 of the lower extremity. Full range of motion in upper and lower extremities. ROM of spine is not restricted. Fine motor movements are normal in both hands.  Sensory is intact and symmetric to  light touch, pinprick and proprioception.  DTR in the upper and lower extremity are present and symmetric 1+. No clonus is noted.  Patient arises from chair without difficulty. Narrow based gait with normal arm swing bilateral , able to walk on heels and toes . Tandem walk is stable. No pronator drift. Rhomberg negative.         Assessment & Plan:  1. Chronic left S1 radiculitis mild. I have encouraged him to exercise, showed him some strengthening exercises, right sitting posture and right lifting technic.  2. Bilateral plantar fasciitis, continue walking/running program. Advised patient to continue ice massage with a frozen water bottle.  3. Bilateral ulnar neuropathy status post left ulnar  nerve surgery, doing much better.  Continue Voltaren gel for his finger pain on the right,( 4th and 5th finger), and for his elbow pain, which has given him good relief.  4. Per patient, Hx of schizophrenia, paranoia, depression and anxiety, sees psychiatrist monthly, Dr. Lodema Hong in [place].   I also demonstrated some exercises in the way he should do them to not hurt his lower back, again.  Also showed him stretching exercises for his back and exercises to strengthen his core, again.   Spent 25 min face to face with patient Follow up in 3 month

## 2013-03-07 NOTE — Patient Instructions (Signed)
Try to exercise as tolerated

## 2013-04-05 ENCOUNTER — Telehealth: Payer: Self-pay

## 2013-04-05 NOTE — Telephone Encounter (Signed)
It is called :  Ibutop-cream

## 2013-04-05 NOTE — Telephone Encounter (Signed)
Patient wants to know if you can call him back and let him know how to say Ibuprofen in [place]?

## 2013-04-08 NOTE — Telephone Encounter (Signed)
Patient informed how to say ibuprofen in [place].

## 2013-06-06 ENCOUNTER — Encounter
Payer: Medicare Other | Attending: Physical Medicine and Rehabilitation | Admitting: Physical Medicine and Rehabilitation

## 2013-06-07 ENCOUNTER — Encounter
Payer: Medicare Other | Attending: Physical Medicine and Rehabilitation | Admitting: Physical Medicine and Rehabilitation

## 2013-06-07 ENCOUNTER — Encounter: Payer: Self-pay | Admitting: Physical Medicine and Rehabilitation

## 2013-06-07 VITALS — BP 132/67 | HR 97 | Resp 14 | Ht 70.0 in | Wt 212.6 lb

## 2013-06-07 DIAGNOSIS — R209 Unspecified disturbances of skin sensation: Secondary | ICD-10-CM | POA: Insufficient documentation

## 2013-06-07 DIAGNOSIS — IMO0002 Reserved for concepts with insufficient information to code with codable children: Secondary | ICD-10-CM | POA: Insufficient documentation

## 2013-06-07 DIAGNOSIS — E119 Type 2 diabetes mellitus without complications: Secondary | ICD-10-CM | POA: Insufficient documentation

## 2013-06-07 DIAGNOSIS — F22 Delusional disorders: Secondary | ICD-10-CM | POA: Insufficient documentation

## 2013-06-07 DIAGNOSIS — M545 Low back pain, unspecified: Secondary | ICD-10-CM | POA: Insufficient documentation

## 2013-06-07 DIAGNOSIS — G5622 Lesion of ulnar nerve, left upper limb: Secondary | ICD-10-CM

## 2013-06-07 DIAGNOSIS — M25579 Pain in unspecified ankle and joints of unspecified foot: Secondary | ICD-10-CM

## 2013-06-07 DIAGNOSIS — F411 Generalized anxiety disorder: Secondary | ICD-10-CM | POA: Insufficient documentation

## 2013-06-07 DIAGNOSIS — G5620 Lesion of ulnar nerve, unspecified upper limb: Secondary | ICD-10-CM | POA: Insufficient documentation

## 2013-06-07 DIAGNOSIS — F3289 Other specified depressive episodes: Secondary | ICD-10-CM | POA: Insufficient documentation

## 2013-06-07 DIAGNOSIS — F329 Major depressive disorder, single episode, unspecified: Secondary | ICD-10-CM | POA: Insufficient documentation

## 2013-06-07 DIAGNOSIS — F209 Schizophrenia, unspecified: Secondary | ICD-10-CM | POA: Insufficient documentation

## 2013-06-07 MED ORDER — GABAPENTIN 400 MG PO CAPS: ORAL_CAPSULE | ORAL | Status: DC

## 2013-06-07 NOTE — Progress Notes (Signed)
Subjective:    Patient ID: Glenn Robertson, male    DOB: July 17, 1966, 47 y.o.   MRN: [id number]  HPI The patient complains about chronic numbness and tingling of his feet, worse on the left. The patient denies any radiation. The patient also reports that the mild pain in the 4th and fifth finger on the right, has resolved almost completely. The patient also complains about very mild LBP, after sitting at his computer for too long.  The problem has been stable.    Pain Inventory Average Pain 3 Pain Right Now 3 My pain is sharp and tingling  In the last 24 hours, has pain interfered with the following? General activity 6 Relation with others 0 Enjoyment of life 4 What TIME of day is your pain at its worst? all Sleep (in general) Good  Pain is worse with: standing and some activites Pain improves with: medication Relief from Meds: 3  Mobility Do you have any goals in this area?  no  Function Do you have any goals in this area?  no  Neuro/Psych numbness depression anxiety  Prior Studies Any changes since last visit?  no  Physicians involved in your care Any changes since last visit?  no   Family History  Problem Relation Age of Onset  . Diabetes Father   . Diabetes Paternal Uncle    History   Social History  . Marital Status: Single    Spouse Name: N/A    Number of Children: N/A  . Years of Education: N/A   Social History Main Topics  . Smoking status: Never Smoker   . Smokeless tobacco: Never Used  . Alcohol Use: No  . Drug Use: No  . Sexual Activity: None   Other Topics Concern  . None   Social History Narrative  . None   Past Surgical History  Procedure Laterality Date  . Arm surgery  1/12    rt ulnar nerve decompression  . Epidural block injection      several  . Ulnar nerve transposition  01/19/2012    Procedure: ULNAR NERVE DECOMPRESSION/TRANSPOSITION;  Surgeon: Wyn Forster., MD;  Location: [facility];  Service:  Orthopedics;  Laterality: Left;  ulnar nerve decompression vs transposition left elbow   Past Medical History  Diagnosis Date  . Diabetes mellitus   . Neuromuscular disorder   . Allergy   . Asthma   . Chronic pain   . Depression    BP 132/67  Pulse 97  Resp 14  Ht 5\' 10"  (1.778 m)  Wt 212 lb 9.6 oz (96.435 kg)  BMI 30.51 kg/m2  SpO2 97%   Review of Systems  Endocrine:       Low blood sugar  Neurological: Positive for numbness.  Psychiatric/Behavioral: Positive for dysphoric mood. The patient is nervous/anxious.   All other systems reviewed and are negative.       Objective:   Physical Exam Constitutional: He is oriented to person, place, and time. He appears well-developed and well-nourished.  HENT:  Head: Normocephalic.  Neck: Neck supple.  Musculoskeletal: He exhibits tenderness.  Neurological: He is alert and oriented to person, place, and time.  Skin: Skin is warm and dry.  Psychiatric: He has a normal mood and affect.  Symmetric normal motor tone is noted throughout. Normal muscle bulk. Muscle testing reveals 5/5 muscle strength of the upper extremity, and 5/5 of the lower extremity. Full range of motion in upper and lower extremities. ROM of spine is  not restricted. Fine motor movements are normal in both hands.  Sensory is intact and symmetric to light touch, pinprick and proprioception.  DTR in the upper and lower extremity are present and symmetric 1+. No clonus is noted.  Patient arises from chair without difficulty. Narrow based gait with normal arm swing bilateral , able to walk on heels and toes . Tandem walk is stable. No pronator drift. Rhomberg negative.         Assessment & Plan:  1. Chronic left S1 radiculitis mild. I have encouraged him to exercise, showed him some strengthening exercises, right sitting posture and right lifting technic.  2. Bilateral plantar fasciitis,has resolved, continue walking/running program. Advised patient to continue ice  massage with a frozen water bottle. He can also use the ibuprofen cream for his foot. 3. Bilateral ulnar neuropathy status post left ulnar nerve surgery, doing much better.  Continue Voltaren gel for his finger pain on the right,( 4th and 5th finger), and for his elbow pain, which has given him good relief.  4. Per patient, Hx of schizophrenia, paranoia, depression and anxiety, sees psychiatrist monthly, Dr. Lodema Hong in [place].  I also demonstrated some exercises in the way he should do them to not hurt his lower back, again.  Also showed him stretching exercises for his back and exercises to strengthen his core, again.   Follow up in 3 month

## 2013-06-07 NOTE — Patient Instructions (Signed)
Continue with your exercise program 

## 2013-06-10 ENCOUNTER — Telehealth: Payer: Self-pay

## 2013-06-10 NOTE — Telephone Encounter (Signed)
Patient called to find out what the best ibuprofen in [place] is and also, what directions should he follow when using his ibuprofen cream.

## 2013-06-10 NOTE — Telephone Encounter (Signed)
Ibutop is the only ibuprofen cream in [place], it is very effective, instructions are in the package, usually they are also in english if not I can translate

## 2013-06-11 NOTE — Telephone Encounter (Signed)
Called to inform patient of the name of Ibuprofen cream in [place].

## 2013-07-31 ENCOUNTER — Telehealth: Payer: Self-pay

## 2013-07-31 NOTE — Telephone Encounter (Signed)
Left patient a voicemail informing him the name of the cream.

## 2013-07-31 NOTE — Telephone Encounter (Signed)
Patient called to say his cousin cannot find the ibuprofen in [place] and wondered if there was another name for it.  Please advise.

## 2013-07-31 NOTE — Telephone Encounter (Signed)
It is Ibutop- cream

## 2013-09-02 ENCOUNTER — Ambulatory Visit: Payer: Medicare Other | Admitting: Physical Medicine & Rehabilitation

## 2013-09-05 ENCOUNTER — Ambulatory Visit
Admission: RE | Admit: 2013-09-05 | Discharge: 2013-09-05 | Disposition: A | Discharge status: Home / Self Care | Payer: Medicare Other | Source: Ambulatory Visit | Attending: Family Medicine | Admitting: Family Medicine

## 2013-09-05 ENCOUNTER — Other Ambulatory Visit: Payer: Self-pay | Admitting: Family Medicine

## 2013-09-05 ENCOUNTER — Ambulatory Visit: Payer: Medicare Other | Admitting: Physical Medicine and Rehabilitation

## 2013-09-05 DIAGNOSIS — R609 Edema, unspecified: Secondary | ICD-10-CM

## 2013-09-27 ENCOUNTER — Ambulatory Visit (HOSPITAL_BASED_OUTPATIENT_CLINIC_OR_DEPARTMENT_OTHER): Payer: Medicare Other | Admitting: Physical Medicine & Rehabilitation

## 2013-09-27 ENCOUNTER — Encounter: Payer: Self-pay | Admitting: Physical Medicine & Rehabilitation

## 2013-09-27 ENCOUNTER — Encounter: Payer: Medicare Other | Attending: Physical Medicine & Rehabilitation

## 2013-09-27 VITALS — BP 116/66 | HR 87 | Resp 14 | Ht 70.0 in | Wt 205.2 lb

## 2013-09-27 DIAGNOSIS — M545 Low back pain, unspecified: Secondary | ICD-10-CM

## 2013-09-27 DIAGNOSIS — X58XXXA Exposure to other specified factors, initial encounter: Secondary | ICD-10-CM | POA: Insufficient documentation

## 2013-09-27 DIAGNOSIS — G8929 Other chronic pain: Secondary | ICD-10-CM

## 2013-09-27 DIAGNOSIS — E119 Type 2 diabetes mellitus without complications: Secondary | ICD-10-CM | POA: Insufficient documentation

## 2013-09-27 DIAGNOSIS — Y9302 Activity, running: Secondary | ICD-10-CM | POA: Insufficient documentation

## 2013-09-27 DIAGNOSIS — IMO0002 Reserved for concepts with insufficient information to code with codable children: Secondary | ICD-10-CM | POA: Insufficient documentation

## 2013-09-27 MED ORDER — GABAPENTIN 300 MG PO CAPS: ORAL_CAPSULE | ORAL | Status: DC

## 2013-09-27 NOTE — Patient Instructions (Signed)
Back Exercises These exercises may help you when beginning to rehabilitate your injury. Your symptoms may resolve with or without further involvement from your physician, physical therapist or athletic trainer. While completing these exercises, remember:   Restoring tissue flexibility helps normal motion to return to the joints. This allows healthier, less painful movement and activity.  An effective stretch should be held for at least 30 seconds.  A stretch should never be painful. You should only feel a gentle lengthening or release in the stretched tissue. STRETCH  Extension, Prone on Elbows   Lie on your stomach on the floor, a bed will be too soft. Place your palms about shoulder width apart and at the height of your head.  Place your elbows under your shoulders. If this is too painful, stack pillows under your chest.  Allow your body to relax so that your hips drop lower and make contact more completely with the floor.  Hold this position for _____30_____ seconds.  Slowly return to lying flat on the floor. 1_____ times per day.  RANGE OF MOTION  Extension, Prone Press Ups   Lie on your stomach on the floor, a bed will be too soft. Place your palms about shoulder width apart and at the height of your head.  Keeping your back as relaxed as possible, slowly straighten your elbows while keeping your hips on the floor. You may adjust the placement of your hands to maximize your comfort. As you gain motion, your hands will come more underneath your shoulders.  Hold this position ____30______ seconds.  Slowly return to lying flat on the floor. Repeat ____2______ times. Complete this exercise _____1_____ times per day.  RANGE OF MOTION- Quadruped, Neutral Spine   Assume a hands and knees position on a firm surface. Keep your hands under your shoulders and your knees under your hips. You may place padding under your knees for comfort.  Drop your head and point your tail bone toward the  ground below you. This will round out your low back like an angry cat. Hold this position for ____10______ seconds.  Slowly lift your head and release your tail bone so that your back sags into a large arch, like an old horse.  Hold this position for ___10_______ seconds.  Repeat this until you feel limber in your low back.  Now, find your "sweet spot." This will be the most comfortable position somewhere between the two previous positions. This is your neutral spine. Once you have found this position, tense your stomach muscles to support your low back.  Hold this position for __10________ seconds. Repeat ____10______ times. Complete this exercise _____1_____ times per day.  STRETCH  Flexion, Single Knee to Chest   Lie on a firm bed or floor with both legs extended in front of you.  Keeping one leg in contact with the floor, bring your opposite knee to your chest. Hold your leg in place by either grabbing behind your thigh or at your knee.  Pull until you feel a gentle stretch in your low back. Hold ____30______ seconds.  Slowly release your grasp and repeat the exercise with the opposite side. Repeat _____2_____ times. Complete this exercise __________ times per day.  STRETCH - Hamstrings, Standing  Stand or sit and extend your right / left leg, placing your foot on a chair or foot stool  Keeping a slight arch in your low back and your hips straight forward.  Lead with your chest and lean forward at the waist until  you feel a gentle stretch in the back of your right / left knee or thigh. (When done correctly, this exercise requires leaning only a small distance.)  Hold this position for ____30______ seconds. Repeat ____2______ times. Complete this stretch __________ times per day. STRENGTHENING  Deep Abdominals, Pelvic Tilt   Lie on a firm bed or floor. Keeping your legs in front of you, bend your knees so they are both pointed toward the ceiling and your feet are flat on the  floor.  Tense your lower abdominal muscles to press your low back into the floor. This motion will rotate your pelvis so that your tail bone is scooping upwards rather than pointing at your feet or into the floor.  With a gentle tension and even breathing, hold this position for __________ seconds. Repeat __________ times. Complete this exercise __________ times per day.  STRENGTHENING  Abdominals, Crunches   Lie on a firm bed or floor. Keeping your legs in front of you, bend your knees so they are both pointed toward the ceiling and your feet are flat on the floor. Cross your arms over your chest.  Slightly tip your chin down without bending your neck.  Tense your abdominals and slowly lift your trunk high enough to just clear your shoulder blades. Lifting higher can put excessive stress on the low back and does not further strengthen your abdominal muscles.  Control your return to the starting position. Repeat __________ times. Complete this exercise __________ times per day.  STRENGTHENING  Quadruped, Opposite UE/LE Lift   Assume a hands and knees position on a firm surface. Keep your hands under your shoulders and your knees under your hips. You may place padding under your knees for comfort.  Find your neutral spine and gently tense your abdominal muscles so that you can maintain this position. Your shoulders and hips should form a rectangle that is parallel with the floor and is not twisted.  Keeping your trunk steady, lift your right hand no higher than your shoulder and then your left leg no higher than your hip. Make sure you are not holding your breath. Hold this position ____10______ seconds.  Continuing to keep your abdominal muscles tense and your back steady, slowly return to your starting position. Repeat with the opposite arm and leg. Repeat _____10_____ times. Complete this exercise ___1_______ times per day. Document Released: 09/02/2005 Document Revised: 11/07/2011  Document Reviewed: 11/27/2008 [facility] Patient Information 2014 [place], [place].

## 2013-09-27 NOTE — Progress Notes (Signed)
Subjective:    Patient ID: Glenn Robertson, male    DOB: [birth year], 48 y.o.   MRN: [id number]  HPI Left > R S1 numbness. Low back pain  Abdominal strain Ulnar numbness overall better since release, using computers without pain Sees psychiatrist on regular basis paranoid schizophrenia, depression anxiety Pain Inventory Average Pain 4 Pain Right Now 1 My pain is sharp, dull, stabbing and aching  In the last 24 hours, has pain interfered with the following? General activity 0 Relation with others 0 Enjoyment of life 0 What TIME of day is your pain at its worst? varies Sleep (in general) NA  Pain is worse with: standing Pain improves with: rest and nsaid cream Relief from Meds: no pain med  Mobility walk without assistance  Function Do you have any goals in this area?  no  Neuro/Psych numbness  Prior Studies Any changes since last visit?  no  Physicians involved in your care Any changes since last visit?  no   Family History  Problem Relation Age of Onset  . Diabetes Father   . Diabetes Paternal Uncle    History   Social History  . Marital Status: Single    Spouse Name: N/A    Number of Children: N/A  . Years of Education: N/A   Social History Main Topics  . Smoking status: Never Smoker   . Smokeless tobacco: Never Used  . Alcohol Use: No  . Drug Use: No  . Sexual Activity: None   Other Topics Concern  . None   Social History Narrative  . None   Past Surgical History  Procedure Laterality Date  . Arm surgery  1/12    rt ulnar nerve decompression  . Epidural block injection      several  . Ulnar nerve transposition  01/19/2012    Procedure: ULNAR NERVE DECOMPRESSION/TRANSPOSITION;  Surgeon: Cammie Sickle., MD;  Location: [facility];  Service: Orthopedics;  Laterality: Left;  ulnar nerve decompression vs transposition left elbow   Past Medical History  Diagnosis Date  . Diabetes mellitus   . Neuromuscular disorder   .  Allergy   . Asthma   . Chronic pain   . Depression    BP 116/66  Pulse 87  Resp 14  Ht 5\' 10"  (1.778 m)  Wt 205 lb 3.2 oz (93.078 kg)  BMI 29.44 kg/m2  SpO2 94%  Opioid Risk Score:   Fall Risk Score: Low Fall Risk (0-5 points) (fall prevention education given )  Review of Systems  Neurological: Positive for numbness.  All other systems reviewed and are negative.       Objective:   Physical Exam  Nursing note and vitals reviewed. Constitutional: He is oriented to person, place, and time. He appears well-developed and well-nourished.  HENT:  Head: Normocephalic and atraumatic.  Eyes: Conjunctivae and EOM are normal. Pupils are equal, round, and reactive to light.  Neck: Normal range of motion.  Musculoskeletal:  No tenderness to palpation and lumbar paraspinal muscles  Normal lumbar flexion but reduced lumbar extension  Neurological: He is alert and oriented to person, place, and time. He has normal strength. No sensory deficit.  Reflex Scores:      Patellar reflexes are 2+ on the right side and 2+ on the left side.      Achilles reflexes are 2+ on the right side and 2+ on the left side. negative negative straight leg raising  Psychiatric: His affect is blunt. His speech  is rapid and/or pressured.          Assessment & Plan:  1. Low back pain with chronic S1 radicular symptoms overall improved. Patient is more active now in running however developing increasing back pain as well as some abdominal strain. We discussed his exercise program which includes fairly intensive rectus abdominis exercises. We discussed stopping the Rollator as well as crunches have given him some other lumbar stabilization exercises and will send into physical therapy to review  Will reduce gabapentin 300 mg 3 times per day , will increase again if radicular symptoms increase. He also has diabetes but no neuropathy hemoglobin A1c is nearly  normal

## 2013-10-03 ENCOUNTER — Telehealth: Payer: Self-pay

## 2013-10-03 NOTE — Telephone Encounter (Signed)
Patient called and wanted to know if he could start using his ab roller again?

## 2013-10-03 NOTE — Telephone Encounter (Signed)
Patient advised to not use his ab roller for 4 weeks.

## 2013-10-03 NOTE — Telephone Encounter (Signed)
Wait 4 wks

## 2013-10-10 ENCOUNTER — Ambulatory Visit: Payer: Medicare Other | Attending: Physical Medicine & Rehabilitation | Admitting: Physical Therapy

## 2013-10-10 DIAGNOSIS — E119 Type 2 diabetes mellitus without complications: Secondary | ICD-10-CM | POA: Insufficient documentation

## 2013-10-10 DIAGNOSIS — IMO0001 Reserved for inherently not codable concepts without codable children: Secondary | ICD-10-CM | POA: Insufficient documentation

## 2013-10-10 DIAGNOSIS — I10 Essential (primary) hypertension: Secondary | ICD-10-CM | POA: Insufficient documentation

## 2013-10-10 DIAGNOSIS — M545 Low back pain, unspecified: Secondary | ICD-10-CM | POA: Insufficient documentation

## 2013-10-15 ENCOUNTER — Ambulatory Visit: Payer: Medicare Other | Admitting: Physical Therapy

## 2013-10-17 ENCOUNTER — Ambulatory Visit: Payer: Medicare Other | Admitting: Physical Therapy

## 2013-10-21 ENCOUNTER — Ambulatory Visit: Payer: Medicare Other | Admitting: Physical Therapy

## 2013-10-23 ENCOUNTER — Ambulatory Visit: Payer: Medicare Other | Admitting: Physical Therapy

## 2013-10-24 ENCOUNTER — Ambulatory Visit: Payer: Medicare Other | Admitting: Physical Therapy

## 2013-10-28 ENCOUNTER — Ambulatory Visit: Payer: Medicare Other | Attending: Physical Medicine & Rehabilitation | Admitting: Physical Therapy

## 2013-10-28 DIAGNOSIS — I10 Essential (primary) hypertension: Secondary | ICD-10-CM | POA: Insufficient documentation

## 2013-10-28 DIAGNOSIS — M545 Low back pain, unspecified: Secondary | ICD-10-CM | POA: Insufficient documentation

## 2013-10-28 DIAGNOSIS — E119 Type 2 diabetes mellitus without complications: Secondary | ICD-10-CM | POA: Insufficient documentation

## 2013-10-28 DIAGNOSIS — IMO0001 Reserved for inherently not codable concepts without codable children: Secondary | ICD-10-CM | POA: Insufficient documentation

## 2013-10-31 ENCOUNTER — Ambulatory Visit: Payer: Medicare Other | Admitting: Physical Therapy

## 2013-11-04 ENCOUNTER — Ambulatory Visit: Payer: Medicare Other | Admitting: Physical Therapy

## 2013-11-07 ENCOUNTER — Ambulatory Visit: Payer: Medicare Other | Admitting: Physical Therapy

## 2013-11-11 ENCOUNTER — Ambulatory Visit: Payer: Medicare Other | Admitting: Physical Therapy

## 2013-11-13 ENCOUNTER — Ambulatory Visit: Payer: Medicare Other | Admitting: Physical Therapy

## 2013-12-20 ENCOUNTER — Ambulatory Visit: Payer: Medicare Other | Admitting: Physical Medicine & Rehabilitation

## 2013-12-20 ENCOUNTER — Encounter: Payer: Medicare Other | Attending: Physical Medicine & Rehabilitation

## 2013-12-20 DIAGNOSIS — Y9302 Activity, running: Secondary | ICD-10-CM | POA: Insufficient documentation

## 2013-12-20 DIAGNOSIS — IMO0002 Reserved for concepts with insufficient information to code with codable children: Secondary | ICD-10-CM | POA: Insufficient documentation

## 2013-12-20 DIAGNOSIS — M545 Low back pain, unspecified: Secondary | ICD-10-CM | POA: Insufficient documentation

## 2013-12-20 DIAGNOSIS — E119 Type 2 diabetes mellitus without complications: Secondary | ICD-10-CM | POA: Insufficient documentation

## 2013-12-20 DIAGNOSIS — X58XXXA Exposure to other specified factors, initial encounter: Secondary | ICD-10-CM | POA: Insufficient documentation

## 2013-12-23 ENCOUNTER — Encounter: Payer: Self-pay | Admitting: Physical Medicine & Rehabilitation

## 2013-12-23 ENCOUNTER — Ambulatory Visit (HOSPITAL_BASED_OUTPATIENT_CLINIC_OR_DEPARTMENT_OTHER): Payer: Medicare Other | Admitting: Physical Medicine & Rehabilitation

## 2013-12-23 VITALS — BP 113/71 | HR 97 | Resp 14 | Ht 70.0 in | Wt 200.0 lb

## 2013-12-23 DIAGNOSIS — IMO0002 Reserved for concepts with insufficient information to code with codable children: Secondary | ICD-10-CM

## 2013-12-23 DIAGNOSIS — M5430 Sciatica, unspecified side: Secondary | ICD-10-CM

## 2013-12-23 MED ORDER — GABAPENTIN 300 MG PO CAPS
300.0000 mg | ORAL_CAPSULE | Freq: Every day | ORAL | Status: DC
Start: 2013-12-23 — End: 2014-03-03

## 2013-12-23 NOTE — Patient Instructions (Signed)
Start toe raises  Gabapentin at night

## 2013-12-23 NOTE — Progress Notes (Signed)
Subjective:    Patient ID: Glenn Robertson, male    DOB: December 04, 1965, 48 y.o.   MRN: [id number]  HPI Burning pain in the little toes. He was supposed to be taking gabapentin but did not get this filled.  No new medical issues. Continues to follow up with psychiatry  Patient has been walking rather than running recently because of ankle injury on the right side this has resolved but patient has not gotten back into the habit of running again Patient is doing some weight lifting Pain Inventory Average Pain 3 Pain Right Now 0 My pain is intermittent, sharp and tingling  In the last 24 hours, has pain interfered with the following? General activity 0 Relation with others 0 Enjoyment of life 0 What TIME of day is your pain at its worst? constant Sleep (in general) Fair  Pain is worse with: sitting and standing Pain improves with: heat/ice Relief from Meds: 0  Mobility walk without assistance ability to climb steps?  yes do you drive?  yes Do you have any goals in this area?  no  Function disabled: date disabled 2003 Do you have any goals in this area?  no  Neuro/Psych numbness depression anxiety  Prior Studies Any changes since last visit?  no  Physicians involved in your care Primary care . Neurologist . Psychiatrist . Psychologist .   Family History  Problem Relation Age of Onset  . Diabetes Father   . Diabetes Paternal Uncle    History   Social History  . Marital Status: Single    Spouse Name: N/A    Number of Children: N/A  . Years of Education: N/A   Social History Main Topics  . Smoking status: Never Smoker   . Smokeless tobacco: Never Used  . Alcohol Use: No  . Drug Use: No  . Sexual Activity: None   Other Topics Concern  . None   Social History Narrative  . None   Past Surgical History  Procedure Laterality Date  . Arm surgery  1/12    rt ulnar nerve decompression  . Epidural block injection      several  . Ulnar nerve transposition   01/19/2012    Procedure: ULNAR NERVE DECOMPRESSION/TRANSPOSITION;  Surgeon: Cammie Sickle., MD;  Location: [facility];  Service: Orthopedics;  Laterality: Left;  ulnar nerve decompression vs transposition left elbow   Past Medical History  Diagnosis Date  . Diabetes mellitus   . Neuromuscular disorder   . Allergy   . Asthma   . Chronic pain   . Depression    BP 113/71  Pulse 97  Resp 14  Ht 5\' 10"  (1.778 m)  Wt 200 lb (90.719 kg)  BMI 28.70 kg/m2  SpO2 98%  Opioid Risk Score:   Fall Risk Score: Low Fall Risk (0-5 points) (patient educated handout declined)   Review of Systems  Constitutional: Positive for unexpected weight change.  Musculoskeletal: Positive for back pain.  Neurological: Positive for numbness.  Psychiatric/Behavioral: Positive for dysphoric mood. The patient is nervous/anxious.   All other systems reviewed and are negative.      Objective:   Physical Exam  Nursing note and vitals reviewed. Constitutional: He is oriented to person, place, and time. He appears well-developed and well-nourished.  HENT:  Head: Normocephalic and atraumatic.  Eyes: Conjunctivae and EOM are normal. Pupils are equal, round, and reactive to light.  Neck: Normal range of motion.  Neurological: He is alert and oriented to  person, place, and time. He has normal strength. Gait normal.  Reflex Scores:      Patellar reflexes are 1+ on the right side and 1+ on the left side.      Achilles reflexes are 2+ on the right side and 1+ on the left side. Psychiatric: He has a normal mood and affect.   Right ankle no pain with range of motion      Assessment & Plan:  1.  Low Back pain chronic left S1 radiculopathy, gabapentin start 300mg  qhs 2.  R ulnar neuropathy improved after release 3.  Diabetes without neuropathy  RTC 8mo

## 2014-01-27 ENCOUNTER — Ambulatory Visit
Admission: RE | Admit: 2014-01-27 | Discharge: 2014-01-27 | Disposition: A | Discharge status: Home / Self Care | Payer: Medicare Other | Source: Ambulatory Visit | Attending: Sports Medicine | Admitting: Sports Medicine

## 2014-01-27 ENCOUNTER — Encounter: Payer: Self-pay | Admitting: Sports Medicine

## 2014-01-27 ENCOUNTER — Ambulatory Visit (INDEPENDENT_AMBULATORY_CARE_PROVIDER_SITE_OTHER): Payer: Medicare Other | Admitting: Sports Medicine

## 2014-01-27 VITALS — BP 111/74 | Ht 70.0 in | Wt 198.0 lb

## 2014-01-27 DIAGNOSIS — M25571 Pain in right ankle and joints of right foot: Secondary | ICD-10-CM | POA: Insufficient documentation

## 2014-01-27 DIAGNOSIS — M25579 Pain in unspecified ankle and joints of unspecified foot: Secondary | ICD-10-CM | POA: Insufficient documentation

## 2014-01-27 DIAGNOSIS — S93401A Sprain of unspecified ligament of right ankle, initial encounter: Secondary | ICD-10-CM

## 2014-01-27 DIAGNOSIS — M79676 Pain in unspecified toe(s): Secondary | ICD-10-CM

## 2014-01-27 DIAGNOSIS — M79609 Pain in unspecified limb: Secondary | ICD-10-CM

## 2014-01-27 DIAGNOSIS — S93409A Sprain of unspecified ligament of unspecified ankle, initial encounter: Secondary | ICD-10-CM

## 2014-01-27 NOTE — Assessment & Plan Note (Signed)
Likely right ankle pain s/p right ankle sprain. Possible CF ligament tear from injury ~4 months ago with current instability on exam and occasional discomfort.  Will obtain ankle films and call patient with result. Prescribed ankle strengthening exercises and demonstrated with patient. Also recommended soft ankle sleeve when running and patient's other brace when doing yard work. Return 3-4 weeks for follow-up; at that time, if still symptomatic, would consider further imaging.

## 2014-01-27 NOTE — Patient Instructions (Signed)
I think you have a ligament tear. We are getting an xray today to evaluate if any fractures. Do exercises discussed today. Follow up in 3-4 weeks. Wear soft ankle sleeve when running, and your other sleeve/brace when doing yard work.

## 2014-01-27 NOTE — Progress Notes (Addendum)
Subjective:   CC: New patient visit for right ankle injury and right great toe pain  HPI:   Right ankle injury 48 y.o. male with h/o sciatica sent from Leonard Downing, MD for right ankle pain. He reports early February inverting the ankle while walking. At that time, there was lots of swelling and lateral ankle pain. He showed to PCP the following week who thought he had an ankle sprain and recommended ice and rest. Pain and swelling resolved, but 2-3 weeks later while doing yard work he re-twisted it (unsure which way). At that time, he injured both ankles. He tries not to aggravate it now and was wearing an ankle brace which he stopped wearing a few weeks ago. By mid-march, pain was improved and he has since been running on it with no further swelling or pain, though he endorses the ankle "does not feel right" and he does not trust it. He takes naproxen for toe pain which does not help the ankle.  Great toe pain Patient reports great toe pain x 3 weeks with discoloration and pain when he touches medial DIP. If barefoot, toe hurts there and at base of toe. He has had similar pain at base of toe in the past. No h/o trauma. No h/o swelling or redness.    Review of Systems - Per HPI.   PMH: Diabetes Chronic back pain Depression  FH: Unknown, not that he knows  SH: Run 1-2 times weekly, limited by frequent urination. Never smoker. Never alcohol. Never drug use. Disability for depression and related issues. Lives in [place] with mom, previously lived in [place], [name].    Objective:  Physical Exam BP 111/74  Ht 5\' 10"  (1.778 m)  Wt 198 lb (89.812 kg)  BMI 28.41 kg/m2 GEN: NAD HEENT: Atraumatic, normocephalic, neck supple, EOMI, sclera clear  PULM: normal effort SKIN: No rash or cyanosis; warm and well-perfused EXTR:  Ankles: No deformity on inspection of either ankle. No effusion, erythema, or swelling. No tenderness to palpation of any portion of ankle or foot. Full ROM.  Mildly decreased stability at ATF and very unstable CF ligament on right ankle (mildly positive anterior drawer and 2+ talar tilt) Normal gait. Knee: Nontender and no effusion bilaterally. Right great toe: Mildly tender at medial aspect of DIP and at base of toe, reproduced pain with great toe flexion, hyperpigmentation medial DIP. No lower extremity edema or calf tenderness PSYCH: Mood and affect euthymic, normal rate and volume of speech NEURO: Awake, alert, no focal deficits grossly, normal speech    Assessment:     Glenn Robertson is a 48 y.o. male here to discuss ankle injury and great toe pain.    Plan:   Right ankle pain Likely right ankle pain s/p right ankle sprain. Possible CF ligament tear from injury ~4 months ago with current instability on exam and occasional discomfort.  Will obtain ankle films and call patient with result. Prescribed ankle strengthening exercises and demonstrated with patient. Also recommended soft ankle sleeve when running and patient's other brace when doing yard work. Return 3-4 weeks for follow-up; at that time, if still symptomatic, would consider further imaging.  Great toe pain By history and exam, most likely toe arthritis with discoloration related to inflammatory changes from possible prior injury. No signs of infection or gout. May take NSAID as needed for severe pain and follow up with PCP. If signs of infection, seek immediate care.   Glenn Sinclair, MD [place]  Addendum: X-rays of his ankle were reviewed. There is a small avulsion fracture off of the distal fibula. No other abnormality seen. No OCD. Patient is interested in trying some formal physical therapy. He has had good success with [facility] at [facility] previously. Therefore, I will refer him back to their office for rehabilitation of his ankle and he will keep his followup appointment with me in 3-4 weeks. I've also recommended that  he try some Aspercreme for his great toe pain.

## 2014-01-27 NOTE — Addendum Note (Signed)
Addended by: Cyd Silence on: 01/27/2014 01:35 PM   Modules accepted: Orders

## 2014-01-27 NOTE — Assessment & Plan Note (Signed)
By history and exam, most likely toe arthritis with discoloration related to inflammatory changes from possible prior injury. No signs of infection or gout. May take NSAID as needed for severe pain and follow up with PCP. If signs of infection, seek immediate care.

## 2014-02-06 ENCOUNTER — Ambulatory Visit: Payer: Medicare Other | Admitting: *Deleted

## 2014-02-07 ENCOUNTER — Encounter: Payer: Self-pay | Admitting: *Deleted

## 2014-02-07 ENCOUNTER — Encounter: Payer: Medicare Other | Attending: Family Medicine | Admitting: *Deleted

## 2014-02-07 VITALS — Ht 70.0 in | Wt 201.1 lb

## 2014-02-07 DIAGNOSIS — Z713 Dietary counseling and surveillance: Secondary | ICD-10-CM | POA: Diagnosis not present

## 2014-02-07 DIAGNOSIS — E119 Type 2 diabetes mellitus without complications: Secondary | ICD-10-CM | POA: Diagnosis present

## 2014-02-07 NOTE — Progress Notes (Signed)
Appt start time: 0800 end time:  0900.  Assessment:  Patient was seen on  02/07/14 for individual diabetes education. Patient missed his appointment yesterday but was able to come back this AM. He states he lives with his Mom for financial reasons, he shops for the food and prepares the meals, usually frozen dinners. He runs about 3 days a week, which has been limited by increased urination. SMBG 0-3 times a day more when exercising. Complains of hypoglycemia several times a week.  Current HbA1c: 6.3%  Preferred Learning Style:   No preference indicated   Learning Readiness:   Ready  Change in progress  MEDICATIONS: see list, diabetes medications are Glipizide and Metformin  DIETARY INTAKE:  24-hr recall:  B ( AM): ham and [place] sandwich and fresh fruit or applesauce, water  Snk ( AM): occasionally [employer] PNB crackers  L ( PM): Healthy Choice etc OR baked beans, vegetable, meat sandwich, water Snk ( PM): occasionally PNB crackers or fruit to prevent low BG D ( PM): smaller than lunch, but similar foods. Usually runs after dinner Snk ( PM): rarely, unless BG is low Beverages: water and infrequently regular Coke to stay awake if driving at night  Usual physical activity: runs 3 days a week as able 1-3 miles depending on how his ankle feels  Estimated energy needs: 1600 calories 180 g carbohydrates 130 g protein 50 g fat  Progress Towards Goal(s):  In progress.   Nutritional Diagnosis:  NB-1.1 Food and nutrition-related knowledge deficit As related to obesity.  As evidenced by BMI of 28.9    Intervention:  Nutrition counseling provided.  Discussed diabetes disease process and treatment options.  Discussed physiology of diabetes and role of obesity on insulin resistance.  Encouraged moderate weight reduction to improve glucose levels.  Discussed role of medications and diet in glucose control  Reviewed patient medications.  Discussed role of medication on blood glucose and  possible side effects  At next visit  Plan to discuss at next visit   Education on macronutrients on glucose levels.  Plan education on carb counting, importance of regularly scheduled meals/snacks, and meal planning  Effects of physical activity on glucose levels and long-term glucose control.    Blood glucose monitoring and interpretation.  Discussed recommended target ranges and individual ranges.    Short-term complications: hyper- and hypo-glycemia.  Discussed causes,symptoms, and treatment options.  Prevention, detection, and treatment of long-term complications.  Discussed the role of prolonged elevated glucose levels on body systems.  Discussed role of stress on blood glucose levels and discussed strategies to manage psychosocial issues.  Recommendations for long-term diabetes self-care.  Established checklist for medical, dental, and emotional self-care.  Teaching Method Utilized: Visual and Auditory  Handouts given during visit include: Living Well with Diabetes Diabetes medication handout  Barriers to learning/adherence to lifestyle change: none  Diabetes self-care support plan:   [facility] support group available  Demonstrated degree of understanding via:  Teach Back   Monitoring/Evaluation:  Dietary intake, exercise, SMBG, and body weight in 1 week(s).

## 2014-02-07 NOTE — Patient Instructions (Signed)
Plan:   Consider asking MD about effect of Glipizide on your BG and if they would consider another medication that doesn't cause hypoglycemia?

## 2014-02-10 ENCOUNTER — Ambulatory Visit: Payer: Medicare Other | Attending: Physical Medicine & Rehabilitation | Admitting: Physical Therapy

## 2014-02-10 DIAGNOSIS — M545 Low back pain, unspecified: Secondary | ICD-10-CM | POA: Insufficient documentation

## 2014-02-10 DIAGNOSIS — E119 Type 2 diabetes mellitus without complications: Secondary | ICD-10-CM | POA: Insufficient documentation

## 2014-02-10 DIAGNOSIS — I10 Essential (primary) hypertension: Secondary | ICD-10-CM | POA: Insufficient documentation

## 2014-02-10 DIAGNOSIS — IMO0001 Reserved for inherently not codable concepts without codable children: Secondary | ICD-10-CM | POA: Insufficient documentation

## 2014-02-11 ENCOUNTER — Ambulatory Visit: Payer: Medicare Other

## 2014-02-12 ENCOUNTER — Ambulatory Visit: Payer: Medicare Other | Admitting: Physical Therapy

## 2014-02-17 ENCOUNTER — Ambulatory Visit: Payer: Medicare Other | Admitting: Physical Therapy

## 2014-02-18 ENCOUNTER — Encounter: Payer: Medicare Other | Admitting: *Deleted

## 2014-02-18 ENCOUNTER — Ambulatory Visit: Payer: Medicare Other

## 2014-02-18 DIAGNOSIS — E119 Type 2 diabetes mellitus without complications: Secondary | ICD-10-CM

## 2014-02-18 NOTE — Progress Notes (Signed)
Appt start time: 1400 end time:  1430.  Assessment:  Patient was seen on  02/18/14 for individual diabetes education follow up visit. We did not complete the last visit due to extreme sleepiness. He states he stopped taking Zyrtec which he states was causing the sleepiness. He is not running due to ankle injury. He stated at his last visit that he was having frequent hypoglycemia and states today that he has decreased his dose of Glipizide on his own from 3 to 2 pills a day. He now states he no longer has hypoglycemia and his post meal BGs are below 130 mg/dl.  Current HbA1c: 6.3%  Preferred Learning Style:   No preference indicated   Learning Readiness:   Ready  Change in progress  MEDICATIONS: see list, diabetes medications are Glipizide and Metformin  DIETARY INTAKE:  24-hr recall:  B ( AM): ham and [place] sandwich and fresh fruit or applesauce, water  Snk ( AM): occasionally [place] PNB crackers  L ( PM): Healthy Choice etc OR baked beans, vegetable, meat sandwich, water Snk ( PM): occasionally PNB crackers or fruit to prevent low BG D ( PM): smaller than lunch, but similar foods. Usually runs after dinner Snk ( PM): rarely, unless BG is low Beverages: water and infrequently regular Coke to stay awake if driving at night  Usual physical activity: runs 3 days a week as able 1-3 miles depending on how his ankle feels  Estimated energy needs: 1600 calories 180 g carbohydrates 130 g protein 50 g fat  Progress Towards Goal(s):  In progress.   Nutritional Diagnosis:  NB-1.1 Food and nutrition-related knowledge deficit As related to obesity.  As evidenced by BMI of 28.9    Intervention:  Nutrition counseling provided.  Reveiwed role of medication on blood glucose and possible side effects  Education on macronutrients on glucose levels.  Discussed carb counting, importance of regularly scheduled meals/snacks, and meal planning  Effects of physical activity on glucose levels  and long-term glucose control.    Reviewed blood glucose monitoring and interpretation.  Discussed recommended target ranges and individual ranges.    Short-term complications: hyper- and hypo-glycemia.  Discussed causes,symptoms, and treatment options.  Prevention, detection, and treatment of long-term complications.  Discussed the role of prolonged elevated glucose levels on body systems.  Discussed role of stress on blood glucose levels and discussed strategies to manage psychosocial issues.  Recommendations for long-term diabetes self-care.  Provided checklist for medical, dental, and emotional self-care.  Plan:  Aim for 3 Carb Choices per meal (45 grams) +/- 1 either way  Aim for 0-2 Carbs per snack if hungry  Include protein in moderation with your meals and snacks Consider reading food labels for Total Carbohydrate of foods Continue with your activity level as tolerated Continue checking BG at alternate times per day as directed by MD   Teaching Method Utilized: Visual and Auditory  Handouts given during visit include: Carb Counting and Food Label handouts Meal Plan Card  Barriers to learning/adherence to lifestyle change: none  Diabetes self-care support plan:   [facility] support group available  Demonstrated degree of understanding via:  Teach Back   Monitoring/Evaluation:  Dietary intake, exercise, SMBG, and body weight PRN.

## 2014-02-18 NOTE — Patient Instructions (Signed)
Plan:  Aim for 3 Carb Choices per meal (45 grams) +/- 1 either way  Aim for 0-2 Carbs per snack if hungry  Include protein in moderation with your meals and snacks Consider reading food labels for Total Carbohydrate of foods Continue with your activity level as tolerated Continue checking BG at alternate times per day as directed by MD

## 2014-02-19 ENCOUNTER — Ambulatory Visit: Payer: Medicare Other | Admitting: Physical Therapy

## 2014-02-20 ENCOUNTER — Ambulatory Visit: Payer: Medicare Other | Admitting: Physical Therapy

## 2014-02-21 ENCOUNTER — Ambulatory Visit: Payer: Medicare Other | Admitting: Physical Therapy

## 2014-02-24 ENCOUNTER — Encounter: Payer: Self-pay | Admitting: Sports Medicine

## 2014-02-24 ENCOUNTER — Ambulatory Visit (INDEPENDENT_AMBULATORY_CARE_PROVIDER_SITE_OTHER): Payer: Medicare Other | Admitting: Sports Medicine

## 2014-02-24 VITALS — BP 120/84 | Ht 70.0 in | Wt 200.0 lb

## 2014-02-24 DIAGNOSIS — M25579 Pain in unspecified ankle and joints of unspecified foot: Secondary | ICD-10-CM

## 2014-02-24 DIAGNOSIS — M25571 Pain in right ankle and joints of right foot: Secondary | ICD-10-CM

## 2014-02-24 NOTE — Patient Instructions (Signed)
Do not run. Avoid prolonged walking Continue physical therapy. Get MRI to evaluate cartilage and then we will call to discuss next step.

## 2014-02-24 NOTE — Progress Notes (Signed)
   Subjective:   CC: Follow up right ankle pain/injury  HPI:   Right ankle injury  Prior history: Injury February and repeat injury 3 weeks later. Treated for ankle sprain by PCP after first injury. During repeat injury in yard hurt left ankle as well. Both likely inversion injuries. Trialed ankle brace. Started running in March after improvement of pain. Pain worsened and patient continued of feelings of instability. When he was seen on initial patient visit 01/27/14 patient was asked to hold off on running, referred to physical therapy, and sent for x-ray of what was thought to be ankle sprain of CFL and ATFL. Ankle films showed an old healed avulsion injury at distal fibula. Patient was also given a soft ankle sleeve.   Interval history: Patient compliant with exercises and not running. He has had 5 PT sessions at this point. Despite this, he continues to complain of intermittent lateral ankle pain as well as feelings of instability.   ROS- no swelling of ankle, no paresthesias. He does get intermittent mechanical symptoms.  PMH: Diabetes, Chronic back pain, Depression, Never smoker. Social history:Disability for depression and related issues., Lives in [place] with mom, previously lived in [place], [name].    Objective:  Physical Exam BP 120/84  Ht 5\' 10"  (1.778 m)  Wt 200 lb (90.719 kg)  BMI 28.70 kg/m2 GEN: NAD, resting comfortably  EXTR:  Ankles: No deformity on inspection of either ankle. No effusion, erythema, or swelling. Mild tenderness just inferior to lateral malleolus along the joint line. Full ROM. 2+ talar tilt on Right though left does display mild laxity. Negative anterior drawer.     Assessment:     Glenn Robertson is a 48 y.o. male here to discuss right ankle injury.     Plan:   Right ankle pain Concern is for an occult OCD. Injury was traumatic in nature and with persistent pain and mechanical symptoms despite conservative treatment I would like to get an MRI scan to  further rule out an osteochondral lesion which may need surgical intervention. In the meantime, he will continue with his physical therapy. No running. I will followup with him via telephone with MRI results once available at which point we will delineate further treatment.

## 2014-02-25 ENCOUNTER — Ambulatory Visit: Payer: Medicare Other | Admitting: Physical Therapy

## 2014-03-02 ENCOUNTER — Ambulatory Visit
Admission: RE | Admit: 2014-03-02 | Discharge: 2014-03-02 | Disposition: A | Discharge status: Home / Self Care | Payer: Medicare Other | Source: Ambulatory Visit | Attending: Sports Medicine | Admitting: Sports Medicine

## 2014-03-02 DIAGNOSIS — M25571 Pain in right ankle and joints of right foot: Secondary | ICD-10-CM

## 2014-03-03 ENCOUNTER — Other Ambulatory Visit: Payer: Self-pay | Admitting: Physical Medicine & Rehabilitation

## 2014-03-03 ENCOUNTER — Ambulatory Visit: Payer: Medicare Other | Admitting: Physical Therapy

## 2014-03-03 ENCOUNTER — Telehealth: Payer: Self-pay | Admitting: Sports Medicine

## 2014-03-03 NOTE — Telephone Encounter (Signed)
I spoke with the patient on the phone today after reviewing the MRI of his right ankle. The MRI does show what appears to be an OCD lesion the lateral corner of the talar dome. 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 This injury is several months old and he has not been able to return to his previous level of activity. Therefore, I've recommended referral to Dr. Sharol Given to discuss further treatment options. He may ultimately need an ankle arthroscopy to address his OCD lesion and possibly ankle reconstruction for his instability. I'll defer further workup and treatment to the discretion of Dr Sharol Given and the patient will followup with me when necessary.

## 2014-03-04 ENCOUNTER — Encounter: Payer: Self-pay | Admitting: *Deleted

## 2014-03-04 NOTE — Patient Instructions (Signed)
DR DUDA Lenard Simmer ORTHO MON 03/24/14 @ 745AM [address]  [phone]

## 2014-03-06 ENCOUNTER — Ambulatory Visit: Payer: Medicare Other | Admitting: Physical Therapy

## 2014-03-11 ENCOUNTER — Ambulatory Visit
Admission: RE | Admit: 2014-03-11 | Discharge: 2014-03-11 | Disposition: A | Discharge status: Home / Self Care | Payer: Medicare Other | Source: Ambulatory Visit | Attending: Family Medicine | Admitting: Family Medicine

## 2014-03-11 ENCOUNTER — Other Ambulatory Visit: Payer: Self-pay | Admitting: Family Medicine

## 2014-03-11 DIAGNOSIS — M2550 Pain in unspecified joint: Secondary | ICD-10-CM

## 2014-03-24 ENCOUNTER — Encounter: Payer: Medicare Other | Attending: Registered Nurse | Admitting: Registered Nurse

## 2014-03-24 ENCOUNTER — Encounter: Payer: Self-pay | Admitting: Registered Nurse

## 2014-03-24 VITALS — BP 121/69 | HR 108 | Resp 16 | Ht 70.0 in | Wt 203.0 lb

## 2014-03-24 DIAGNOSIS — M25539 Pain in unspecified wrist: Secondary | ICD-10-CM | POA: Diagnosis not present

## 2014-03-24 DIAGNOSIS — J45909 Unspecified asthma, uncomplicated: Secondary | ICD-10-CM | POA: Diagnosis not present

## 2014-03-24 DIAGNOSIS — G5620 Lesion of ulnar nerve, unspecified upper limb: Secondary | ICD-10-CM | POA: Diagnosis not present

## 2014-03-24 DIAGNOSIS — IMO0002 Reserved for concepts with insufficient information to code with codable children: Secondary | ICD-10-CM | POA: Diagnosis not present

## 2014-03-24 DIAGNOSIS — M545 Low back pain, unspecified: Secondary | ICD-10-CM | POA: Insufficient documentation

## 2014-03-24 DIAGNOSIS — F329 Major depressive disorder, single episode, unspecified: Secondary | ICD-10-CM | POA: Insufficient documentation

## 2014-03-24 DIAGNOSIS — G8929 Other chronic pain: Secondary | ICD-10-CM | POA: Diagnosis not present

## 2014-03-24 DIAGNOSIS — G709 Myoneural disorder, unspecified: Secondary | ICD-10-CM | POA: Insufficient documentation

## 2014-03-24 DIAGNOSIS — E119 Type 2 diabetes mellitus without complications: Secondary | ICD-10-CM | POA: Insufficient documentation

## 2014-03-24 DIAGNOSIS — Z79899 Other long term (current) drug therapy: Secondary | ICD-10-CM | POA: Diagnosis not present

## 2014-03-24 DIAGNOSIS — F3289 Other specified depressive episodes: Secondary | ICD-10-CM | POA: Diagnosis not present

## 2014-03-24 NOTE — Progress Notes (Signed)
Subjective:    Patient ID: Glenn Robertson, male    DOB: Oct 11, 1965, 48 y.o.   MRN: [id number]  HPI: Mr. Dereon Corkery is a 48 year old male who returns for follow up for chronic pain. He says his pain is located in his lower back and right ankle. He rates his pain 0. His current exercise regime is bicycling every other day he rides 7 miles, planks he will start lifting light weights today.  He says he works on [employer] and his back hurts at times. Spoke to him about performing core exercises he verbalizes understanding. Pain Inventory Average Pain 0 Pain Right Now 0 My pain is constant  In the last 24 hours, has pain interfered with the following? General activity 4 Relation with others 0 Enjoyment of life 0 What TIME of day is your pain at its worst? constant all day Sleep (in general) Good  Pain is worse with: sitting, some activites and other Pain improves with: rest and heat/ice Relief from Meds: no pain meds  Mobility walk without assistance transfers alone Do you have any goals in this area?  no  Function disabled: date disabled na Do you have any goals in this area?  no  Neuro/Psych No problems in this area  Prior Studies Any changes since last visit?  yes CT/MRI  Physicians involved in your care Any changes since last visit?  no   Family History  Problem Relation Age of Onset  . Diabetes Father   . Diabetes Paternal Uncle    History   Social History  . Marital Status: Single    Spouse Name: N/A    Number of Children: N/A  . Years of Education: N/A   Social History Main Topics  . Smoking status: Never Smoker   . Smokeless tobacco: Never Used  . Alcohol Use: No  . Drug Use: No  . Sexual Activity: None   Other Topics Concern  . None   Social History Narrative  . None   Past Surgical History  Procedure Laterality Date  . Arm surgery  1/12    rt ulnar nerve decompression  . Epidural block injection      several  . Ulnar nerve  transposition  01/19/2012    Procedure: ULNAR NERVE DECOMPRESSION/TRANSPOSITION;  Surgeon: Cammie Sickle., MD;  Location: [facility];  Service: Orthopedics;  Laterality: Left;  ulnar nerve decompression vs transposition left elbow   Past Medical History  Diagnosis Date  . Diabetes mellitus   . Neuromuscular disorder   . Allergy   . Asthma   . Chronic pain   . Depression    BP 121/69  Pulse 108  Resp 16  Ht 5\' 10"  (1.778 m)  Wt 203 lb (92.08 kg)  BMI 29.13 kg/m2  SpO2 98%  Opioid Risk Score:   Fall Risk Score: Low Fall Risk (0-5 points) (pt educated on fall risk, brochure given to pt previously)    Review of Systems  All other systems reviewed and are negative.      Objective:   Physical Exam  Nursing note and vitals reviewed. Constitutional: He is oriented to person, place, and time. He appears well-developed and well-nourished.  HENT:  Head: Normocephalic and atraumatic.  Neck: Normal range of motion. Neck supple.  Cardiovascular: Normal rate and normal heart sounds.   Pulmonary/Chest: Effort normal and breath sounds normal.  Musculoskeletal:  Normal Muscle Bulk and Muscle testing Reveals: Upper Extremities: Full ROM and Muscle Strength on  the Right 5/5 and Left 4/5 Spinal Forward Flexion 90 Degrees and Extension 30 Degrees Back without Spinal or Paraspinal Tenderness Lower Extremities: Full ROM and Muscle strength 5/5 Arises from chair with ease Narrow Based Gait   Neurological: He is alert and oriented to person, place, and time.  Skin: Skin is warm and dry.  Psychiatric: He has a normal mood and affect.          Assessment & Plan:  1 Low Back pain chronic left S1 radiculopathy: Continue gabapentin, Continue exercise regime. 2. R ulnar neuropathy: Continue to monitor. No Complaints today. 3. Diabetes without neuropathy : Continue current medication regime and exercise.   20 minutes of face to face patient care time was spent during  this visit. All question were encouraged and answered.  F/U in 3 Months

## 2014-06-24 ENCOUNTER — Encounter: Payer: Medicare Other | Attending: Physical Medicine & Rehabilitation

## 2014-06-24 ENCOUNTER — Ambulatory Visit (HOSPITAL_BASED_OUTPATIENT_CLINIC_OR_DEPARTMENT_OTHER): Payer: Medicare Other | Admitting: Physical Medicine & Rehabilitation

## 2014-06-24 ENCOUNTER — Encounter: Payer: Self-pay | Admitting: Physical Medicine & Rehabilitation

## 2014-06-24 VITALS — BP 124/69 | HR 80 | Resp 14 | Wt 207.0 lb

## 2014-06-24 DIAGNOSIS — M545 Low back pain, unspecified: Secondary | ICD-10-CM

## 2014-06-24 NOTE — Progress Notes (Signed)
Subjective:    Patient ID: Glenn Robertson, male    DOB: Aug 04, 1966, 48 y.o.   MRN: [id number]  HPI Chief complaint is poor standing tolerance Since 1987 patient complains that standing tolerances about 15 minutes. Also has chronic left lateral foot numbness. Previously diagnosed as S1 radiculopathy.  More recently has undergone MRI of 4 right ankle pain. Remote anterior talofibular ligament tear was seen on MRI July 2015.  Lumbar pain increases with prolonged standing. Symptoms are nonprogressive. Pain Inventory Average Pain 0 Pain Right Now 0 My pain is no pain  In the last 24 hours, has pain interfered with the following? General activity 0 Relation with others 0 Enjoyment of life 9 What TIME of day is your pain at its worst? n/a Sleep (in general) n/a  Pain is worse with: standing Pain improves with: rest Relief from Meds: n/a  Mobility walk without assistance  Function Do you have any goals in this area?  no  Neuro/Psych depression anxiety  Prior Studies Any changes since last visit?  no  Physicians involved in your care Any changes since last visit?  no   Family History  Problem Relation Age of Onset  . Diabetes Father   . Diabetes Paternal Uncle    History   Social History  . Marital Status: Single    Spouse Name: N/A    Number of Children: N/A  . Years of Education: N/A   Social History Main Topics  . Smoking status: Never Smoker   . Smokeless tobacco: Never Used  . Alcohol Use: No  . Drug Use: No  . Sexual Activity: None   Other Topics Concern  . None   Social History Narrative  . None   Past Surgical History  Procedure Laterality Date  . Arm surgery  1/12    rt ulnar nerve decompression  . Epidural block injection      several  . Ulnar nerve transposition  01/19/2012    Procedure: ULNAR NERVE DECOMPRESSION/TRANSPOSITION;  Surgeon: Cammie Sickle., MD;  Location: [facility];  Service: Orthopedics;  Laterality:  Left;  ulnar nerve decompression vs transposition left elbow   Past Medical History  Diagnosis Date  . Diabetes mellitus   . Neuromuscular disorder   . Allergy   . Asthma   . Chronic pain   . Depression    BP 124/69  Pulse 80  Resp 14  Wt 207 lb (93.895 kg)  SpO2 96%  Opioid Risk Score:   Fall Risk Score: Low Fall Risk (0-5 points) (previoulsy educated and given handout)  Review of Systems  Psychiatric/Behavioral: Positive for dysphoric mood. The patient is nervous/anxious.   All other systems reviewed and are negative.      Objective:   Physical Exam  Nursing note and vitals reviewed. Constitutional: He is oriented to person, place, and time.  Musculoskeletal:       Lumbar back: Normal.  FROM Lumbar spine  Neurological: He is alert and oriented to person, place, and time. He has normal strength. No sensory deficit. Gait normal.  Reflex Scores:      Patellar reflexes are 2+ on the right side and 2+ on the left side.      Achilles reflexes are 1+ on the right side and 1+ on the left side.         Assessment & Plan:  1.  Lumbar axial pain.Chronic No evidence of radiculopathy. With reduced standing tolerance. Examination is normal. We'll check x-rays.If negative  may need to ask PT to address posture. May have excessive lordosis. Follow-up in a month.

## 2014-06-30 ENCOUNTER — Other Ambulatory Visit: Payer: Self-pay | Admitting: Physical Medicine & Rehabilitation

## 2014-07-01 ENCOUNTER — Ambulatory Visit (HOSPITAL_COMMUNITY)
Admission: RE | Admit: 2014-07-01 | Discharge: 2014-07-01 | Disposition: A | Discharge status: Home / Self Care | Payer: Medicare Other | Source: Ambulatory Visit | Attending: Diagnostic Radiology | Admitting: Diagnostic Radiology

## 2014-07-01 DIAGNOSIS — M47816 Spondylosis without myelopathy or radiculopathy, lumbar region: Secondary | ICD-10-CM | POA: Diagnosis not present

## 2014-07-01 DIAGNOSIS — M545 Low back pain, unspecified: Secondary | ICD-10-CM

## 2014-07-18 ENCOUNTER — Encounter: Payer: Medicare Other | Attending: Physical Medicine & Rehabilitation

## 2014-07-18 ENCOUNTER — Encounter: Payer: Self-pay | Admitting: Physical Medicine & Rehabilitation

## 2014-07-18 ENCOUNTER — Ambulatory Visit (HOSPITAL_BASED_OUTPATIENT_CLINIC_OR_DEPARTMENT_OTHER): Payer: Medicare Other | Admitting: Physical Medicine & Rehabilitation

## 2014-07-18 VITALS — BP 127/68 | HR 75 | Resp 14 | Wt 208.6 lb

## 2014-07-18 DIAGNOSIS — M545 Low back pain, unspecified: Secondary | ICD-10-CM | POA: Insufficient documentation

## 2014-07-18 DIAGNOSIS — M25561 Pain in right knee: Secondary | ICD-10-CM | POA: Diagnosis not present

## 2014-07-18 DIAGNOSIS — G8929 Other chronic pain: Secondary | ICD-10-CM | POA: Diagnosis not present

## 2014-07-18 NOTE — Progress Notes (Signed)
Subjective:    Patient ID: Glenn Robertson, male    DOB: Jan 23, 1966, 48 y.o.   MRN: [id number] Chief complaint pain with prolonged standing and low back HPI Patient states that if he stands for more than an hour or so his back does hurt. He does not have any pain shooting down his legs. No discrete recent injury. This is been going on for years Reviewed x-ray which showed some disc space narrowing at L5-S1 and otherwise unremarkable no facet arthropathy, no SI joint arthropathy noted  Also complaining of right great toe pain no history of trauma. He has had an x-ray in July when this started. Also complains of right knee pain and this is around the kneecap mainly with walking up Stairs, Walking downstairs not as bad  Pain Inventory Average Pain 0 Pain Right Now 0 My pain is na  In the last 24 hours, has pain interfered with the following? General activity 4 Relation with others 4 Enjoyment of life 4 What TIME of day is your pain at its worst? varies Sleep (in general) Good  Pain is worse with: walking Pain improves with: rest and heat/ice Relief from Meds: 0  Mobility Do you have any goals in this area?  no  Function Do you have any goals in this area?  no  Neuro/Psych No problems in this area  Prior Studies Any changes since last visit?  no  Physicians involved in your care Any changes since last visit?  no   Family History  Problem Relation Age of Onset  . Diabetes Father   . Diabetes Paternal Uncle    History   Social History  . Marital Status: Single    Spouse Name: N/A    Number of Children: N/A  . Years of Education: N/A   Social History Main Topics  . Smoking status: Never Smoker   . Smokeless tobacco: Never Used  . Alcohol Use: No  . Drug Use: No  . Sexual Activity: None   Other Topics Concern  . None   Social History Narrative   Past Surgical History  Procedure Laterality Date  . Arm surgery  1/12    rt ulnar nerve decompression  .  Epidural block injection      several  . Ulnar nerve transposition  01/19/2012    Procedure: ULNAR NERVE DECOMPRESSION/TRANSPOSITION;  Surgeon: Cammie Sickle., MD;  Location: [facility];  Service: Orthopedics;  Laterality: Left;  ulnar nerve decompression vs transposition left elbow   Past Medical History  Diagnosis Date  . Diabetes mellitus   . Neuromuscular disorder   . Allergy   . Asthma   . Chronic pain   . Depression    BP 127/68 mmHg  Pulse 75  Resp 14  Wt 208 lb 9.6 oz (94.62 kg)  SpO2 96%  Opioid Risk Score:   Fall Risk Score: Low Fall Risk (0-5 points) (previoulsy educated and given handout) Review of Systems  All other systems reviewed and are negative.      Objective:   Physical Exam  Constitutional: He is oriented to person, place, and time. He appears well-developed and well-nourished.  HENT:  Head: Normocephalic and atraumatic.  Eyes: Conjunctivae and EOM are normal. Pupils are equal, round, and reactive to light.  Musculoskeletal:       Right knee: He exhibits MCL laxity. He exhibits normal range of motion, no swelling, no effusion, no deformity, no LCL laxity and normal patellar mobility. Tenderness found. Patellar  tendon tenderness noted.       Right foot: There is tenderness. There is normal range of motion.  Mild tenderness at the base of the right great toe plantar surface. No evidence of deformity no evidence of swelling no ecchymosis, no skin lesions no callus formation noted tenderness over the metatarsal heads  Neurological: He is alert and oriented to person, place, and time. He has normal strength.  Psychiatric: He has a normal mood and affect.  Nursing note and vitals reviewed.         Assessment & Plan:  1. Low back pain, chronic likely L5-S1 degenerative disc, reassuranceof the treatment needed at this point. No evidence of radiculopathy. 2. Right knee pain and patellar tendinitis plus minus chondromalacia. Have given him  exercise send, consider Voltaren gel if doesn't get much better 3. Right great toe pain plantar surface. No evidence of joint deformity. Likely soft tissue related, TURF  Toe, Discussed insole modification metatarsal felt pad recommended  RTC 3 months

## 2014-07-18 NOTE — Progress Notes (Deleted)
   Subjective:    Patient ID: Glenn Robertson, male    DOB: 1966/06/19, 48 y.o.   MRN: [id number]  HPI    Review of Systems     Objective:   Physical Exam        Assessment & Plan:

## 2014-07-18 NOTE — Patient Instructions (Signed)
Patellar Tendinitis, Jumper's Knee with Chondromalacia Patella with Rehab Tendinitis is inflammation of a tendon. Tendonitis of the tendon below the kneecap (patella) is known as patellar tendonitis. Patellar tendonitis is a common cause of pain below the kneecap (infrapatellar). Patellar tendonitis may involve a tear (strain) in the ligament. Strains are classified into three categories. Grade 1 strains cause pain, but the tendon is not lengthened. Grade 2 strains include a lengthened ligament, due to the ligament being stretched or partially ruptured. With grade 2 strains there is still function, although function may be decreased. Grade 3 strains involve a complete tear of the tendon or muscle, and function is usually impaired. Patellar tendon strains are usually grade 1 or 2.  SYMPTOMS   Pain, tenderness, swelling, warmth, or redness over the patellar tendon (just below the kneecap).  Pain and loss of strength (sometimes), with forcefully straightening the knee (especially when jumping or rising from a seated or squatting position), or bending the knee completely (squatting or kneeling).  Crackling sound (crepitation) when the tendon is moved or touched. CAUSES  Patellar tendonitis is caused by injury to the patellar tendon. The inflammation is the body's healing response. Common causes of injury include:  Stress from a sudden increase in intensity, frequency, or duration of training.  Overuse of the thigh muscles (quadriceps) and patellar tendon.  Direct hit (trauma) to the knee or patellar tendon. RISK INCREASES WITH:  Sports that require sudden, explosive quadriceps contraction, such as jumping, quick starts, or kicking.  Running sports, especially running down hills.  Poor strength and flexibility of the thigh and knee.  Flat feet. PREVENTION  Warm up and stretch properly before activity.  Allow for adequate recovery between workouts.  Maintain physical fitness:  Strength,  flexibility, and endurance.  Cardiovascular fitness.  Protect the knee joint with taping, protective strapping, bracing, or elastic compression bandage.  Wear arch supports (orthotics). PROGNOSIS  If treated properly, patellar tendonitis usually heals within 6 weeks.  RELATED COMPLICATIONS   Longer healing time if not properly treated or if not given enough time to heal.  Recurring symptoms if activity is resumed too soon, with overuse, with a direct blow, or when using poor technique.  If untreated, tendon rupture requiring surgery. TREATMENT Treatment first involves the use of ice and medicine to reduce pain and inflammation. The use of strengthening and stretching exercises may help reduce pain with activity. These exercises may be performed at home or with a therapist. Serious cases of tendonitis may require restraining the knee for 10 to 14 days to prevent stress on the tendon and to promote healing. Crutches may be used (uncommon) until you can walk without a limp. For cases in which nonsurgical treatment is unsuccessful, surgery may be advised to remove the inflamed tendon lining (sheath). Surgery is rare, and is only advised after at least 6 months of nonsurgical treatment. MEDICATION   If pain medicine is needed, nonsteroidal anti-inflammatory medicines (aspirin and ibuprofen), or other minor pain relievers (acetaminophen), are often advised.  Do not take pain medicine for 7 days before surgery.  Prescription pain relievers may be given if your caregiver thinks they are needed. Use only as directed and only as much as you need. HEAT AND COLD  Cold treatment (icing) should be applied for 10 to 15 minutes every 2 to 3 hours for inflammation and pain, and immediately after activity that aggravates your symptoms. Use ice packs or an ice massage.  Heat treatment may be used before performing stretching  and strengthening activities prescribed by your caregiver, physical therapist, or  athletic trainer. Use a heat pack or a warm water soak. SEEK MEDICAL CARE IF:  Symptoms get worse or do not improve in 2 weeks, despite treatment.  New, unexplained symptoms develop. (Drugs used in treatment may produce side effects.) EXERCISES RANGE OF MOTION (ROM) AND STRETCHING EXERCISES - Patellar Tendinitis (Jumper's Knee) These are some of the initial exercises with which you may start your rehabilitation program, until you see your caregiver again or until your symptoms are resolved. Remember:   Flexible tissue is more tolerant of the stresses placed on it during activities.  Each stretch should be held for 20 to 30 seconds.  A gentle stretching sensation should be felt. STRETCH - Hamstrings, Supine  Lie on your back. Loop a belt or towel over the ball of your right / left foot.  Straighten your right / left knee and slowly pull on the belt to raise your leg. Do not allow the right / left knee to bend. Keep your opposite leg flat on the floor.  Raise the leg until you feel a gentle stretch behind your right / left knee or thigh. Hold this position for __________ seconds. Repeat __________ times. Complete this stretch __________ times per day.  STRETCH - Hamstrings, Doorway  Lie on your back with your right / left leg extended and resting on the wall, and the opposite leg flat on the ground through the door. At first, position your bottom farther away from the wall.  Keep your right / left knee straight. If you feel a stretch behind your knee or thigh, hold this position for __________ seconds.  If you do not feel a stretch, scoot your bottom closer to the door, and hold __________ seconds. Repeat __________ times. Complete this stretch __________ times per day.  STRETCH - Hamstrings, Standing  Stand or sit and extend your right / left leg, placing your foot on a chair or foot stool.  Keep a slight arch in your low back and your hips straight forward.  Lead with your chest  and lean forward at the waist until you feel a gentle stretch in the back of your right / left knee or thigh. (When done correctly, this exercise requires leaning only a small distance.)  Hold this position for __________ seconds. Repeat __________ times. Complete this stretch __________ times per day. STRETCH - Adductors, Lunge  While standing, spread your legs, with your right / left leg behind you.  Lean away from your right / left leg by bending your opposite knee. You may rest your hands on your thigh for balance.  You should feel a stretch in your right / left inner thigh. Hold for __________ seconds. Repeat __________ times. Complete this exercise __________ times per day.  STRENGTHENING EXERCISES - Patellar Tendinitis (Jumper's Knee) These exercises may help you when beginning to rehabilitate your injury. They may resolve your symptoms with or without further involvement from your physician, physical therapist or athletic trainer. While completing these exercises, remember:   Muscles can gain both the endurance and the strength needed for everyday activities through controlled exercises.  Complete these exercises as instructed by your physician, physical therapist or athletic trainer. Increase the resistance and repetitions only as guided by your caregiver. STRENGTH - Quadriceps, Isometrics  Lie on your back with your right / left leg extended and your opposite knee bent.  Gradually tense the muscles in the front of your right / left thigh.  You should see either your kneecap slide up toward your hip or increased dimpling just above the knee. This motion will push the back of the knee down toward the floor, mat, or bed on which you are lying.  Hold the muscle as tight as you can, without increasing your pain, for __________ seconds.  Relax the muscles slowly and completely in between each repetition. Repeat __________ times. Complete this exercise __________ times per day.    STRENGTH - Quadriceps, Short Arcs  Lie on your back. Place a __________ inch towel roll under your right / left knee, so that the knee bends slightly.  Raise only your lower leg by tightening the muscles in the front of your thigh. Do not allow your thigh to rise.  Hold this position for __________ seconds. Repeat __________ times. Complete this exercise __________ times per day.  OPTIONAL ANKLE WEIGHTS: Begin with ____________________, but DO NOT exceed ____________________. Increase in 1 pound/ 0.5 kilogram increments. STRENGTH - Quadriceps, Straight Leg Raises  Quality counts! Watch for signs that the quadriceps muscle is working, to be sure you are strengthening the correct muscles and not "cheating" by substituting with healthier muscles.  Lay on your back with your right / left leg extended and your opposite knee bent.  Tense the muscles in the front of your right / left thigh. You should see either your kneecap slide up or increased dimpling just above the knee. Your thigh may even shake a bit.  Tighten these muscles even more and raise your leg 4 to 6 inches off the floor. Hold for __________ seconds.  Keeping these muscles tense, lower your leg.  Relax the muscles slowly and completely between each repetition. Repeat __________ times. Complete this exercise __________ times per day.  STRENGTH - Quadriceps, Squats  Stand in a door frame so that your feet and knees are in line with the frame.  Use your hands for balance, not support, on the frame.  Slowly lower your weight, bending at the hips and knees. Keep your lower legs upright so that they are parallel with the door frame. Squat only within the range that does not increase your knee pain. Never let your hips drop below your knees.  Slowly return upright, pushing with your legs, not pulling with your hands. Repeat __________ times. Complete this exercise __________ times per day.  STRENGTH - Quadriceps,  Step-Downs  Stand on the edge of a step stool or stair. Be prepared to use a countertop or wall for balance, if needed.  Keeping your right / left knee directly over the middle of your foot, slowly touch your opposite heel to the floor or lower step. Do not go all the way to the floor if your knee pain increases; just go as far as you can without increased discomfort. Use your right / left leg muscles, not gravity to lower your body weight.  Slowly push your body weight back up to the starting position. Repeat __________ times. Complete this exercise __________ times per day.  Document Released: 08/15/2005 Document Revised: 12/30/2013 Document Reviewed: 11/27/2008 [facility] Patient Information 2015 [place], [place]. This information is not intended to replace advice given to you by your health care provider. Make sure you discuss any questions you have with your health care provider.

## 2014-07-30 ENCOUNTER — Telehealth: Payer: Self-pay | Admitting: *Deleted

## 2014-07-30 NOTE — Telephone Encounter (Signed)
Glenn Robertson has some questions for Dr Letta Pate.  He says that 1) his knee has stopped aching when he goes up stairs. 2) Does he still want him to run? His ankles hurt when he runs on pavement but he is ok when running on dirt or soft surface.  Please advise

## 2014-07-31 NOTE — Telephone Encounter (Signed)
Called patient and advised run on soft track 1/2 mile 3 times per week and gradually work up.

## 2014-07-31 NOTE — Telephone Encounter (Signed)
Recommend running on soft track starting with 1/2 mile 3 times per week and gradually building up to 1 mile 3 times per week over the next month

## 2014-08-18 ENCOUNTER — Telehealth: Payer: Self-pay | Admitting: *Deleted

## 2014-08-18 NOTE — Telephone Encounter (Signed)
Knee is no longer hurting going up and down stairs, occasional pain in ankles when jogging on pavement, not so much on dirt pathways

## 2014-08-18 NOTE — Telephone Encounter (Signed)
May make PT referral for right knee patellar tendinitis and chondromalacia as well as metatarsalgia, to include instruction on community exercise program and running

## 2014-08-18 NOTE — Telephone Encounter (Signed)
Pt called asking for the instructions Dr. Letta Pate gave him for running(?), exercises (?), message was brief, not much detail

## 2014-08-25 ENCOUNTER — Ambulatory Visit: Payer: Medicare Other | Attending: Physical Medicine & Rehabilitation | Admitting: Physical Therapy

## 2014-08-25 DIAGNOSIS — M25561 Pain in right knee: Secondary | ICD-10-CM | POA: Diagnosis present

## 2014-08-27 ENCOUNTER — Ambulatory Visit: Payer: Medicare Other | Admitting: Physical Therapy

## 2014-08-27 DIAGNOSIS — M25561 Pain in right knee: Secondary | ICD-10-CM | POA: Diagnosis not present

## 2014-09-03 ENCOUNTER — Ambulatory Visit: Payer: Medicare Other | Attending: Physical Medicine & Rehabilitation | Admitting: Physical Therapy

## 2014-09-03 DIAGNOSIS — M25561 Pain in right knee: Secondary | ICD-10-CM | POA: Diagnosis present

## 2014-09-04 ENCOUNTER — Ambulatory Visit: Payer: Medicare Other | Admitting: Physical Therapy

## 2014-09-04 DIAGNOSIS — M25561 Pain in right knee: Secondary | ICD-10-CM | POA: Diagnosis not present

## 2014-09-05 ENCOUNTER — Ambulatory Visit: Payer: Medicare Other | Admitting: Physical Therapy

## 2014-09-09 ENCOUNTER — Ambulatory Visit: Payer: Medicare Other | Admitting: Physical Therapy

## 2014-09-09 DIAGNOSIS — M25561 Pain in right knee: Secondary | ICD-10-CM | POA: Diagnosis not present

## 2014-09-11 ENCOUNTER — Ambulatory Visit: Payer: Medicare Other | Admitting: Physical Therapy

## 2014-09-11 DIAGNOSIS — M25561 Pain in right knee: Secondary | ICD-10-CM | POA: Diagnosis not present

## 2014-09-15 ENCOUNTER — Ambulatory Visit: Payer: Medicare Other | Admitting: Physical Therapy

## 2014-09-15 DIAGNOSIS — M25561 Pain in right knee: Secondary | ICD-10-CM | POA: Diagnosis not present

## 2014-09-17 ENCOUNTER — Ambulatory Visit: Payer: Medicare Other | Admitting: Physical Therapy

## 2014-09-17 DIAGNOSIS — M25561 Pain in right knee: Secondary | ICD-10-CM | POA: Diagnosis not present

## 2014-09-19 ENCOUNTER — Ambulatory Visit: Payer: Medicare Other | Admitting: Physical Medicine & Rehabilitation

## 2014-09-22 ENCOUNTER — Encounter: Payer: Medicare Other | Attending: Physical Medicine & Rehabilitation

## 2014-09-22 ENCOUNTER — Ambulatory Visit (HOSPITAL_BASED_OUTPATIENT_CLINIC_OR_DEPARTMENT_OTHER): Payer: Medicare Other | Admitting: Physical Medicine & Rehabilitation

## 2014-09-22 ENCOUNTER — Encounter: Payer: Self-pay | Admitting: Physical Medicine & Rehabilitation

## 2014-09-22 VITALS — BP 130/91 | HR 84 | Resp 14

## 2014-09-22 DIAGNOSIS — M545 Low back pain: Secondary | ICD-10-CM | POA: Diagnosis present

## 2014-09-22 DIAGNOSIS — M25561 Pain in right knee: Secondary | ICD-10-CM | POA: Insufficient documentation

## 2014-09-22 DIAGNOSIS — G8929 Other chronic pain: Secondary | ICD-10-CM | POA: Insufficient documentation

## 2014-09-22 DIAGNOSIS — M654 Radial styloid tenosynovitis [de Quervain]: Secondary | ICD-10-CM

## 2014-09-22 MED ORDER — MELOXICAM 7.5 MG PO TABS: 7.5000 mg | ORAL_TABLET | Freq: Every day | ORAL | Status: DC

## 2014-09-22 NOTE — Progress Notes (Signed)
Subjective:    Patient ID: Glenn Robertson, male    DOB: 1965-09-03, 49 y.o.   MRN: [id number]  HPI CC:  Left elbow pain 49 year old male with prior history of left ulnar transposition in 2012 who has a one-week history of left elbow pain. No recent trauma to that area. Pain is mainly when he is holding his cell phone. Patient also notes problems when he holds his elbow at 89. No numbness or tingling in the fingers. Pain in the left dorsal webspace of the hand No significant neck pain.   Pain Inventory Average Pain 2 Pain Right Now 2 My pain is constant and dull  In the last 24 hours, has pain interfered with the following? General activity 7 Relation with others 1 Enjoyment of life 7 What TIME of day is your pain at its worst? all Sleep (in general) NA  Pain is worse with: some activites Pain improves with: na Relief from Meds: na  Mobility walk without assistance ability to climb steps?  yes  Function Do you have any goals in this area?  no  Neuro/Psych numbness depression anxiety  Prior Studies Any changes since last visit?  no  Physicians involved in your care Any changes since last visit?  no   Family History  Problem Relation Age of Onset  . Diabetes Father   . Diabetes Paternal Uncle    History   Social History  . Marital Status: Single    Spouse Name: N/A    Number of Children: N/A  . Years of Education: N/A   Social History Main Topics  . Smoking status: Never Smoker   . Smokeless tobacco: Never Used  . Alcohol Use: No  . Drug Use: No  . Sexual Activity: None   Other Topics Concern  . None   Social History Narrative   Past Surgical History  Procedure Laterality Date  . Arm surgery  1/12    rt ulnar nerve decompression  . Epidural block injection      several  . Ulnar nerve transposition  01/19/2012    Procedure: ULNAR NERVE DECOMPRESSION/TRANSPOSITION;  Surgeon: Cammie Sickle., MD;  Location: [facility];   Service: Orthopedics;  Laterality: Left;  ulnar nerve decompression vs transposition left elbow   Past Medical History  Diagnosis Date  . Diabetes mellitus   . Neuromuscular disorder   . Allergy   . Asthma   . Chronic pain   . Depression    BP 130/91 mmHg  Pulse 84  Resp 14  SpO2 99%  Opioid Risk Score:   Fall Risk Score: Low Fall Risk (0-5 points)  Review of Systems  Neurological: Positive for numbness.  Psychiatric/Behavioral: Positive for dysphoric mood. The patient is nervous/anxious.   All other systems reviewed and are negative.      Objective:   Physical Exam  Nursing note and vitals reviewed.  Positive Finkelstein's test Negative Tinel's over wrist and elbow of the left hand  No tenderness over the ulnar groove, no tenderness at the lateral or medial epicondyles of the elbow  Normal sensation in the left hand No atrophy in the hand intrinsic muscles Full range of motion at the wrist elbow fingers. No pain with cervical range of motion, full cervical range Gen. No acute distress       Assessment & Plan:  1. Left wrist and dorsal webspace pain, de Quervain's syndrome. We'll start meloxicam 7.5 mg per day and immobilized in a splint. If not  much better in 1 month would refer to PT and consider ultrasound-guided injection  Discussed with patient agrees with plan  2. Left elbow pain exam is unremarkable. Do not think any further imaging studies is necessary at this time. Advised not leaning on the left elbow as is his habit

## 2014-09-22 NOTE — Patient Instructions (Signed)
De Quervain's Disease Harriet Pho disease is a condition often seen in racquet sports where there is a soreness (inflammation) in the cord like structures (tendons) which attach muscle to bone on the thumb side of the wrist. There may be a tightening of the tissuesaround the tendons. This condition is often helped by giving up or modifying the activity which caused it. When conservative treatment does not help, surgery may be required. Conservative treatment could include changes in the activity which brought about the problem or made it worse. Anti-inflammatory medications and injections may be used to help decrease the inflammation and help with pain control. Your caregiver will help you determine which is best for you. DIAGNOSIS  Often the diagnosis (learning what is wrong) can be made by examination. Sometimes x-rays are required. HOME CARE INSTRUCTIONS   Apply ice to the sore area for 15-20 minutes, 03-04 times per day while awake. Put the ice in a plastic bag and place a towel between the bag of ice and your skin. This is especially helpful if it can be done after all activities involving the sore wrist.  Temporary splinting may help.  Only take over-the-counter or prescription medicines for pain, discomfort or fever as directed by your caregiver. SEEK MEDICAL CARE IF:   Pain relief is not obtained with medications, or if you have increasing pain and seem to be getting worse rather than better. MAKE SURE YOU:   Understand these instructions.  Will watch your condition.  Will get help right away if you are not doing well or get worse. Document Released: 05/10/2001 Document Revised: 11/07/2011 Document Reviewed: 12/18/2013 [facility] Patient Information 2015 [place], [place]. This information is not intended to replace advice given to you by your health care provider. Make sure you discuss any questions you have with your health care provider.  Thumb spica splint May obtain at pharmacy,  would wear 2-4 weeks

## 2014-09-27 ENCOUNTER — Other Ambulatory Visit: Payer: Self-pay | Admitting: Physical Medicine & Rehabilitation

## 2014-10-17 ENCOUNTER — Ambulatory Visit: Payer: Medicare Other | Admitting: Physical Medicine & Rehabilitation

## 2014-10-20 ENCOUNTER — Ambulatory Visit: Payer: Medicare Other | Admitting: Physical Medicine & Rehabilitation

## 2014-10-28 ENCOUNTER — Encounter: Payer: Medicare Other | Attending: Physical Medicine & Rehabilitation

## 2014-10-28 ENCOUNTER — Encounter: Payer: Self-pay | Admitting: Physical Medicine & Rehabilitation

## 2014-10-28 ENCOUNTER — Ambulatory Visit (HOSPITAL_BASED_OUTPATIENT_CLINIC_OR_DEPARTMENT_OTHER): Payer: Medicare Other | Admitting: Physical Medicine & Rehabilitation

## 2014-10-28 VITALS — BP 111/70 | HR 85 | Resp 14

## 2014-10-28 DIAGNOSIS — M545 Low back pain: Secondary | ICD-10-CM | POA: Insufficient documentation

## 2014-10-28 DIAGNOSIS — M659 Synovitis and tenosynovitis, unspecified: Secondary | ICD-10-CM

## 2014-10-28 DIAGNOSIS — G8929 Other chronic pain: Secondary | ICD-10-CM | POA: Insufficient documentation

## 2014-10-28 DIAGNOSIS — M7651 Patellar tendinitis, right knee: Secondary | ICD-10-CM

## 2014-10-28 DIAGNOSIS — M25561 Pain in right knee: Secondary | ICD-10-CM | POA: Diagnosis not present

## 2014-10-28 DIAGNOSIS — M6588 Other synovitis and tenosynovitis, other site: Secondary | ICD-10-CM

## 2014-10-28 NOTE — Progress Notes (Signed)
Subjective:    Patient ID: Glenn Robertson, male    DOB: 28-Apr-1966, 49 y.o.   MRN: [id number]  HPI  49 year old male with history of multiple musculoskeletal pain complaints. He is had right iliotibial band syndrome, sciatica,As well as ulnar neuropathy. He is trying to remain physically active but feels like he gets injured very easily. Most recently he has been battling knee pain over the last several months. No falls or injuries. In the past he was seen by physical therapy for iliotibial band syndrome however he states that this pain is more right over the kneecap and inf to the kneecap. He is alternating running with walking 3-5 minutes each for 3-4 cycles. After doing this however he had pain with going up and down steps the following day. Pain with running   In addition he is complained of left thumb pain. He has been in a thumb spica splint which has been helpful. Using his phone has been aggravating this particularly using a touch screen. He is trying to use his computer more then his phone  No numbness or tingling in the left hand, no history of trauma in the left hand  Pain Inventory Average Pain 0 Pain Right Now 0 My pain is intermittent  In the last 24 hours, has pain interfered with the following? General activity 6 Relation with others 0 Enjoyment of life 5 What TIME of day is your pain at its worst? varies Sleep (in general) Good  Pain is worse with: some activites Pain improves with: rest Relief from Meds: 0  Mobility walk without assistance Do you have any goals in this area?  no  Function Do you have any goals in this area?  no  Neuro/Psych No problems in this area  Prior Studies Any changes since last visit?  no  Physicians involved in your care Any changes since last visit?  no   Family History  Problem Relation Age of Onset  . Diabetes Father   . Diabetes Paternal Uncle    History   Social History  . Marital Status: Single    Spouse  Name: N/A  . Number of Children: N/A  . Years of Education: N/A   Social History Main Topics  . Smoking status: Never Smoker   . Smokeless tobacco: Never Used  . Alcohol Use: No  . Drug Use: No  . Sexual Activity: Not on file   Other Topics Concern  . None   Social History Narrative   Past Surgical History  Procedure Laterality Date  . Arm surgery  1/12    rt ulnar nerve decompression  . Epidural block injection      several  . Ulnar nerve transposition  01/19/2012    Procedure: ULNAR NERVE DECOMPRESSION/TRANSPOSITION;  Surgeon: Cammie Sickle., MD;  Location: [facility];  Service: Orthopedics;  Laterality: Left;  ulnar nerve decompression vs transposition left elbow   Past Medical History  Diagnosis Date  . Diabetes mellitus   . Neuromuscular disorder   . Allergy   . Asthma   . Chronic pain   . Depression    There were no vitals taken for this visit.  Opioid Risk Score:   Fall Risk Score: Low Fall Risk (0-5 points)  Review of Systems     Objective:   Physical Exam  Constitutional: He is oriented to person, place, and time.  Musculoskeletal:       Left wrist: He exhibits decreased range of motion.  Right knee: He exhibits normal range of motion, no swelling, no effusion, no ecchymosis, no deformity, no erythema and normal alignment. No medial joint line, no lateral joint line and no patellar tendon tenderness noted.       Left hand: He exhibits tenderness. Normal strength noted.  Negative Apley grind test No apprehension sign  No pain with carpometacarpal stress Negative Finkelstein's left wrist and hand  Neurological: He is alert and oriented to person, place, and time.  Psychiatric: He has a normal mood and affect.          Assessment & Plan:  1. Right patellar tendinitis, likely related to overtraining, patient tries to get back into running too quickly, went over very gradual increases in running distance or duration or  speed. Before he starts running again I think he needs to go through physical therapy for stretching and strengthening of the muscle groups above and below the knee. He may benefit from from core strengthening as well In addition I recommended that he walks or rides an exercise bike but with the seat high up so there is minimal knee bending  2. Tenosynovitis left wrist improved, Gradually wean spica splint

## 2014-10-28 NOTE — Patient Instructions (Signed)
Patellar Tendinitis, Jumper's Knee with Rehab Tendinitis is inflammation of a tendon. Tendonitis of the tendon below the kneecap (patella) is known as patellar tendonitis. Patellar tendonitis is a common cause of pain below the kneecap (infrapatellar). Patellar tendonitis may involve a tear (strain) in the ligament. Strains are classified into three categories. Grade 1 strains cause pain, but the tendon is not lengthened. Grade 2 strains include a lengthened ligament, due to the ligament being stretched or partially ruptured. With grade 2 strains there is still function, although function may be decreased. Grade 3 strains involve a complete tear of the tendon or muscle, and function is usually impaired. Patellar tendon strains are usually grade 1 or 2.  SYMPTOMS   Pain, tenderness, swelling, warmth, or redness over the patellar tendon (just below the kneecap).  Pain and loss of strength (sometimes), with forcefully straightening the knee (especially when jumping or rising from a seated or squatting position), or bending the knee completely (squatting or kneeling).  Crackling sound (crepitation) when the tendon is moved or touched. CAUSES  Patellar tendonitis is caused by injury to the patellar tendon. The inflammation is the body's healing response. Common causes of injury include:  Stress from a sudden increase in intensity, frequency, or duration of training.  Overuse of the thigh muscles (quadriceps) and patellar tendon.  Direct hit (trauma) to the knee or patellar tendon. RISK INCREASES WITH:  Sports that require sudden, explosive quadriceps contraction, such as jumping, quick starts, or kicking.  Running sports, especially running down hills.  Poor strength and flexibility of the thigh and knee.  Flat feet. PREVENTION  Warm up and stretch properly before activity.  Allow for adequate recovery between workouts.  Maintain physical fitness:  Strength, flexibility, and  endurance.  Cardiovascular fitness.  Protect the knee joint with taping, protective strapping, bracing, or elastic compression bandage.  Wear arch supports (orthotics). PROGNOSIS  If treated properly, patellar tendonitis usually heals within 6 weeks.  RELATED COMPLICATIONS   Longer healing time if not properly treated or if not given enough time to heal.  Recurring symptoms if activity is resumed too soon, with overuse, with a direct blow, or when using poor technique.  If untreated, tendon rupture requiring surgery. TREATMENT Treatment first involves the use of ice and medicine to reduce pain and inflammation. The use of strengthening and stretching exercises may help reduce pain with activity. These exercises may be performed at home or with a therapist. Serious cases of tendonitis may require restraining the knee for 10 to 14 days to prevent stress on the tendon and to promote healing. Crutches may be used (uncommon) until you can walk without a limp. For cases in which nonsurgical treatment is unsuccessful, surgery may be advised to remove the inflamed tendon lining (sheath). Surgery is rare, and is only advised after at least 6 months of nonsurgical treatment. MEDICATION   If pain medicine is needed, nonsteroidal anti-inflammatory medicines (aspirin and ibuprofen), or other minor pain relievers (acetaminophen), are often advised.  Do not take pain medicine for 7 days before surgery.  Prescription pain relievers may be given if your caregiver thinks they are needed. Use only as directed and only as much as you need. HEAT AND COLD  Cold treatment (icing) should be applied for 10 to 15 minutes every 2 to 3 hours for inflammation and pain, and immediately after activity that aggravates your symptoms. Use ice packs or an ice massage.  Heat treatment may be used before performing stretching and strengthening activities  prescribed by your caregiver, physical therapist, or athletic trainer.  Use a heat pack or a warm water soak. SEEK MEDICAL CARE IF:  Symptoms get worse or do not improve in 2 weeks, despite treatment.  New, unexplained symptoms develop. (Drugs used in treatment may produce side effects.) EXERCISES RANGE OF MOTION (ROM) AND STRETCHING EXERCISES - Patellar Tendinitis (Jumper's Knee) These are some of the initial exercises with which you may start your rehabilitation program, until you see your caregiver again or until your symptoms are resolved. Remember:   Flexible tissue is more tolerant of the stresses placed on it during activities.  Each stretch should be held for 20 to 30 seconds.  A gentle stretching sensation should be felt. STRETCH - Hamstrings, Supine  Lie on your back. Loop a belt or towel over the ball of your right / left foot.  Straighten your right / left knee and slowly pull on the belt to raise your leg. Do not allow the right / left knee to bend. Keep your opposite leg flat on the floor.  Raise the leg until you feel a gentle stretch behind your right / left knee or thigh. Hold this position for __________ seconds. Repeat __________ times. Complete this stretch __________ times per day.  STRETCH - Hamstrings, Doorway  Lie on your back with your right / left leg extended and resting on the wall, and the opposite leg flat on the ground through the door. At first, position your bottom farther away from the wall.  Keep your right / left knee straight. If you feel a stretch behind your knee or thigh, hold this position for __________ seconds.  If you do not feel a stretch, scoot your bottom closer to the door, and hold __________ seconds. Repeat __________ times. Complete this stretch __________ times per day.  STRETCH - Hamstrings, Standing  Stand or sit and extend your right / left leg, placing your foot on a chair or foot stool.  Keep a slight arch in your low back and your hips straight forward.  Lead with your chest and lean forward  at the waist until you feel a gentle stretch in the back of your right / left knee or thigh. (When done correctly, this exercise requires leaning only a small distance.)  Hold this position for __________ seconds. Repeat __________ times. Complete this stretch __________ times per day. STRETCH - Adductors, Lunge  While standing, spread your legs, with your right / left leg behind you.  Lean away from your right / left leg by bending your opposite knee. You may rest your hands on your thigh for balance.  You should feel a stretch in your right / left inner thigh. Hold for __________ seconds. Repeat __________ times. Complete this exercise __________ times per day.  STRENGTHENING EXERCISES - Patellar Tendinitis (Jumper's Knee) These exercises may help you when beginning to rehabilitate your injury. They may resolve your symptoms with or without further involvement from your physician, physical therapist or athletic trainer. While completing these exercises, remember:   Muscles can gain both the endurance and the strength needed for everyday activities through controlled exercises.  Complete these exercises as instructed by your physician, physical therapist or athletic trainer. Increase the resistance and repetitions only as guided by your caregiver. STRENGTH - Quadriceps, Isometrics  Lie on your back with your right / left leg extended and your opposite knee bent.  Gradually tense the muscles in the front of your right / left thigh. You should see  either your kneecap slide up toward your hip or increased dimpling just above the knee. This motion will push the back of the knee down toward the floor, mat, or bed on which you are lying.  Hold the muscle as tight as you can, without increasing your pain, for __________ seconds.  Relax the muscles slowly and completely in between each repetition. Repeat __________ times. Complete this exercise __________ times per day.  STRENGTH - Quadriceps,  Short Arcs  Lie on your back. Place a __________ inch towel roll under your right / left knee, so that the knee bends slightly.  Raise only your lower leg by tightening the muscles in the front of your thigh. Do not allow your thigh to rise.  Hold this position for __________ seconds. Repeat __________ times. Complete this exercise __________ times per day.  OPTIONAL ANKLE WEIGHTS: Begin with ____________________, but DO NOT exceed ____________________. Increase in 1 pound/ 0.5 kilogram increments. STRENGTH - Quadriceps, Straight Leg Raises  Quality counts! Watch for signs that the quadriceps muscle is working, to be sure you are strengthening the correct muscles and not "cheating" by substituting with healthier muscles.  Lay on your back with your right / left leg extended and your opposite knee bent.  Tense the muscles in the front of your right / left thigh. You should see either your kneecap slide up or increased dimpling just above the knee. Your thigh may even shake a bit.  Tighten these muscles even more and raise your leg 4 to 6 inches off the floor. Hold for __________ seconds.  Keeping these muscles tense, lower your leg.  Relax the muscles slowly and completely between each repetition. Repeat __________ times. Complete this exercise __________ times per day.  STRENGTH - Quadriceps, Squats  Stand in a door frame so that your feet and knees are in line with the frame.  Use your hands for balance, not support, on the frame.  Slowly lower your weight, bending at the hips and knees. Keep your lower legs upright so that they are parallel with the door frame. Squat only within the range that does not increase your knee pain. Never let your hips drop below your knees.  Slowly return upright, pushing with your legs, not pulling with your hands. Repeat __________ times. Complete this exercise __________ times per day.  STRENGTH - Quadriceps, Step-Downs  Stand on the edge of a step  stool or stair. Be prepared to use a countertop or wall for balance, if needed.  Keeping your right / left knee directly over the middle of your foot, slowly touch your opposite heel to the floor or lower step. Do not go all the way to the floor if your knee pain increases; just go as far as you can without increased discomfort. Use your right / left leg muscles, not gravity to lower your body weight.  Slowly push your body weight back up to the starting position. Repeat __________ times. Complete this exercise __________ times per day.  Document Released: 08/15/2005 Document Revised: 12/30/2013 Document Reviewed: 11/27/2008 [facility] Patient Information 2015 [place], [place]. This information is not intended to replace advice given to you by your health care provider. Make sure you discuss any questions you have with your health care provider.

## 2014-10-30 ENCOUNTER — Telehealth: Payer: Self-pay | Admitting: *Deleted

## 2014-10-30 NOTE — Telephone Encounter (Signed)
Left message on Glenn Robertson's vm

## 2014-10-30 NOTE — Telephone Encounter (Signed)
Glenn Robertson has some questions.  [name]  What about his knee? [name], is it ok?

## 2014-10-30 NOTE — Telephone Encounter (Signed)
PT if for RIght Patellar tendinitis  May bike  Would hold off running until he completes PT

## 2014-10-31 ENCOUNTER — Telehealth: Payer: Self-pay | Admitting: *Deleted

## 2014-10-31 DIAGNOSIS — M7651 Patellar tendinitis, right knee: Secondary | ICD-10-CM

## 2014-10-31 NOTE — Telephone Encounter (Signed)
Glenn Robertson called back and said that his PT referral ([facility]) needs to be changed to for his knee because it is for his finger.  There is one in place 10/28/14 for tenosynovitis of his fingers.

## 2014-11-06 ENCOUNTER — Telehealth: Payer: Self-pay | Admitting: *Deleted

## 2014-11-06 ENCOUNTER — Ambulatory Visit: Payer: Medicare Other | Attending: Physical Medicine & Rehabilitation | Admitting: Physical Therapy

## 2014-11-06 ENCOUNTER — Encounter: Payer: Self-pay | Admitting: Physical Therapy

## 2014-11-06 DIAGNOSIS — M25561 Pain in right knee: Secondary | ICD-10-CM | POA: Diagnosis present

## 2014-11-06 DIAGNOSIS — M659 Synovitis and tenosynovitis, unspecified: Secondary | ICD-10-CM

## 2014-11-06 DIAGNOSIS — M7651 Patellar tendinitis, right knee: Secondary | ICD-10-CM

## 2014-11-06 NOTE — Therapy (Signed)
[address] [place], [name], [address] Phone: [phone]   Fax:  [phone]  Physical Therapy Evaluation  Patient Details  Name: Glenn Robertson MRN: [id number] Date of Birth: Jun 16, 1966 Referring Provider:  Charlett Blake, MD  Encounter Date: 11/06/2014      PT End of Session - 11/06/14 0925    Visit Number 1   Number of Visits 16   Date for PT Re-Evaluation 01/06/15   PT Start Time 0909   PT Stop Time 1000   PT Time Calculation (min) 51 min   Behavior During Therapy [facility] for tasks assessed/performed      Past Medical History  Diagnosis Date  . Diabetes mellitus   . Neuromuscular disorder   . Allergy   . Asthma   . Chronic pain   . Depression     Past Surgical History  Procedure Laterality Date  . Arm surgery  1/12    rt ulnar nerve decompression  . Epidural block injection      several  . Ulnar nerve transposition  01/19/2012    Procedure: ULNAR NERVE DECOMPRESSION/TRANSPOSITION;  Surgeon: Cammie Sickle., MD;  Location: [place];  Service: Orthopedics;  Laterality: Left;  ulnar nerve decompression vs transposition left elbow    There were no vitals taken for this visit.  Visit Diagnosis:  Patellar tendonitis of right knee - Plan: PT plan of care cert/re-cert  Tenosynovitis, wrist - Plan: PT plan of care cert/re-cert      Subjective Assessment - 11/06/14 0908    Symptoms Patient has been seen here a few times previously and usually has his symptoms resolve.  He reports that he has had some left thumb tenosynovitis for a long period of time and it is worse recently.  He reports after PT earlier in the year he got back to running and wa s building up and started haiving pain in the right knee   Limitations Walking;House hold activities;Lifting   How long can you sit comfortably? unlimited   How long can you stand comfortably? 30 minutes   How long can you walk  comfortably? 30 minutes, reports biggest issue is going up and down stairs   Diagnostic tests none   Patient Stated Goals go up and down stairs, run, grip and lift as well as swipe on the iphone   Currently in Pain? Yes   Pain Score 0-No pain   Pain Location Knee   Pain Orientation Right   Pain Descriptors / Indicators Sharp   Pain Type Chronic pain   Pain Onset More than a month ago   Pain Frequency Intermittent   Aggravating Factors  reports that if he goes up and down the stairs his pain can go up to 8/10   Pain Relieving Factors rest and not running or negotiating stairs   Effect of Pain on Daily Activities limits stairs and gripping with left hand   Multiple Pain Sites Yes   Pain Score 1   Pain Location Hand   Pain Descriptors / Indicators Sore   Pain Frequency Intermittent   Pain Onset On-going          [facility] PT Assessment - 11/06/14 0001    Assessment   Medical Diagnosis right knee patellar tendonitis, and left thum tenosynovitis   Onset Date 10/28/14   Prior Therapy yes   Precautions   Precautions None   Restrictions   Weight Bearing Restrictions No  Balance Screen   Has the patient fallen in the past 6 months No   Has the patient had a decrease in activity level because of a fear of falling?  No   Is the patient reluctant to leave their home because of a fear of falling?  No   Prior Function   Level of Independence Independent with basic ADLs;Independent with homemaking with ambulation   Leisure likes to run, some weight lifting   AROM   Right/Left Thumb --  left thumb ROM is WNL's pain with extension and opposition   Right Knee Extension 0   Right Knee Flexion 105   Strength   Overall Strength Comments right knee 4-/5, left grip 60#, right 85#   Flexibility   Soft Tissue Assessment /Muscle Length --  tight hamstrings   Palpation   Palpation some tenderness over the right patellar tendon, also tender at the MCP of the thumb   Ambulation/Gait   Gait  Comments mild antalgic pain on the right                          PT Education - 29-Nov-2014 0925    Education provided Yes   Education Details RICE   Person(s) Educated Patient   Methods Explanation;Demonstration   Comprehension Verbalized understanding          PT Short Term Goals - Nov 29, 2014 0939    PT SHORT TERM GOAL #1   Title independent with RICE   Time 2   Period Weeks   Status New           PT Long Term Goals - 2014-11-29 0940    PT LONG TERM GOAL #1   Title independent with HEP   Time 8   Period Weeks   Status New   PT LONG TERM GOAL #2   Title increase left grip strength to 75#   Time 8   Period Weeks   Status New   PT LONG TERM GOAL #3   Title increase right knee ROM to 0-125 degrees flexion   Time 8   Period Weeks   PT LONG TERM GOAL #4   Title ascend and descend stairs without pain    Time 8   Period Weeks   Status New   PT LONG TERM GOAL #5   Title grip book and or lift with left hand without pain   Time 8   Period Weeks   Status New               Plan - November 29, 2014 1610    Clinical Impression Statement Patient reports a left thumb pain from swiping on iphone,  Also right patellar pain from trying to do a couch to 5K training routine.  He has pain with stairs and with gripping items   Pt will benefit from skilled therapeutic intervention in order to improve on the following deficits Decreased mobility;Decreased range of motion;Difficulty walking;Impaired flexibility;Improper body mechanics;Pain;Impaired UE functional use;Decreased strength   Rehab Potential Good   PT Frequency 2x / week   PT Duration 8 weeks   PT Treatment/Interventions Electrical Stimulation;Ultrasound;Moist Heat;Therapeutic exercise;Therapeutic activities;Patient/family education;Passive range of motion;Manual techniques   PT Next Visit Plan add exercises   Consulted and Agree with Plan of Care Patient          G-Codes - 2014/11/29 0946    Functional  Assessment Tool Used FOTO   Functional Limitation Other PT primary   Other PT  Primary Current Status [phone]) At least 40 percent but less than 60 percent impaired, limited or restricted   Other PT Primary Goal Status ([id number]) At least 40 percent but less than 60 percent impaired, limited or restricted       Problem List Patient Active Problem List   Diagnosis Date Noted  . Chronic low back pain 07/18/2014  . Knee pain, acute 07/18/2014  . Right ankle pain 01/27/2014  . Great toe pain 01/27/2014  . Sciatica 01/10/2012  . Thoracic or lumbosacral neuritis or radiculitis, unspecified 12/15/2011  . Ulnar neuropathy at elbow 11/14/2011  . Disturbance of skin sensation 11/14/2011    Sumner Boast, PT 11/06/2014, 9:48 AM  [address] [place], [name], [address] Phone: [phone]   Fax:  [phone]

## 2014-11-06 NOTE — Telephone Encounter (Signed)
Glenn Robertson is now calling about whether a patellar strap would be beneficial for him to wear so he can go back to running. I spoke with him and told him Kirsteins was out of office until Monday but would forward message to him.  In the meantime I told him to speak with his PT about this and maybe they could advise him.

## 2014-11-06 NOTE — Telephone Encounter (Signed)
Needs to first complete PT and then we can consider the strap

## 2014-11-06 NOTE — Telephone Encounter (Signed)
Notified [name].

## 2014-11-10 ENCOUNTER — Ambulatory Visit: Payer: Medicare Other | Admitting: Physical Therapy

## 2014-11-10 ENCOUNTER — Encounter: Payer: Self-pay | Admitting: Physical Therapy

## 2014-11-10 DIAGNOSIS — M659 Synovitis and tenosynovitis, unspecified: Secondary | ICD-10-CM

## 2014-11-10 DIAGNOSIS — M7651 Patellar tendinitis, right knee: Secondary | ICD-10-CM

## 2014-11-10 DIAGNOSIS — M25561 Pain in right knee: Secondary | ICD-10-CM | POA: Diagnosis not present

## 2014-11-10 NOTE — Therapy (Signed)
[address] [place] [place], [name], [address] Phone: [phone]   Fax:  [phone]  Physical Therapy Treatment  Patient Details  Name: Glenn Robertson MRN: [id number] Date of Birth: 02-27-1966 Referring Provider:  Charlett Blake, MD  Encounter Date: 11/10/2014      PT End of Session - 11/10/14 1021    Visit Number 2   Number of Visits 16   Date for PT Re-Evaluation 01/06/15   PT Start Time 0903   PT Stop Time 1017   PT Time Calculation (min) 74 min   Behavior During Therapy [facility] for tasks assessed/performed      Past Medical History  Diagnosis Date  . Diabetes mellitus   . Neuromuscular disorder   . Allergy   . Asthma   . Chronic pain   . Depression     Past Surgical History  Procedure Laterality Date  . Arm surgery  1/12    rt ulnar nerve decompression  . Epidural block injection      several  . Ulnar nerve transposition  01/19/2012    Procedure: ULNAR NERVE DECOMPRESSION/TRANSPOSITION;  Surgeon: Cammie Sickle., MD;  Location: [place];  Service: Orthopedics;  Laterality: Left;  ulnar nerve decompression vs transposition left elbow    There were no vitals filed for this visit.  Visit Diagnosis:  Patellar tendonitis of right knee  Tenosynovitis, wrist      Subjective Assessment - 11/10/14 0927    Symptoms Reports he rode his bike over the weekend.  Not bad.  Stairs are the worst.  He reports chasing after nephew did cause some pain over the weekend.   Limitations Walking;House hold activities;Lifting   How long can you sit comfortably? unlimited   How long can you stand comfortably? 30 minutes   Diagnostic tests none   Patient Stated Goals go up and down stairs, run, grip and lift as well as swipe on the iphone   Currently in Pain? Yes   Pain Score 1    Pain Location Knee   Pain Orientation Right   Pain Descriptors / Indicators Dull   Pain Onset More than a  month ago   Pain Frequency Intermittent   Aggravating Factors  stairs for knee and swiping on phone for thumb   Pain Relieving Factors rest                       [facility] Adult PT Treatment/Exercise - 11/10/14 0001    Knee/Hip Exercises: Aerobic   Elliptical R= 7, I = 13 x 6 minutes   Isokinetic NuStep L6 x 6 minutes   Knee/Hip Exercises: Standing   Walking with Sports Cord 100 feet all directions   Hand Exercises   MCPJ Flexion AROM;PROM   MCPJ Extension AROM;PROM   Thumb Opposition 15 reps   Modalities   Modalities Paraffin;Iontophoresis   Iontophoresis   Type of Iontophoresis Dexamethasone   Location right patellar tendon   Dose 80 mA   Time 4 hour patch   LUE Paraffin   Number Minutes Paraffin 15 Minutes   LUE Paraffin Location Hand   Knee/Hip Exercises: Machines for Strengthening   Cybex Knee Extension 35#   Cybex Knee Flexion 10#   Cybex Leg Press 60#                  PT Short Term Goals - 11/06/14 0939    PT SHORT TERM  GOAL #1   Title independent with RICE   Time 2   Period Weeks   Status New           PT Long Term Goals - 11/06/14 0940    PT LONG TERM GOAL #1   Title independent with HEP   Time 8   Period Weeks   Status New   PT LONG TERM GOAL #2   Title increase left grip strength to 75#   Time 8   Period Weeks   Status New   PT LONG TERM GOAL #3   Title increase right knee ROM to 0-125 degrees flexion   Time 8   Period Weeks   PT LONG TERM GOAL #4   Title ascend and descend stairs without pain    Time 8   Period Weeks   Status New   PT LONG TERM GOAL #5   Title grip book and or lift with left hand without pain   Time 8   Period Weeks   Status New               Plan - 11/10/14 1022    Rehab Potential Good   PT Frequency 2x / week   PT Duration 8 weeks   PT Treatment/Interventions Electrical Stimulation;Ultrasound;Moist Heat;Therapeutic exercise;Therapeutic activities;Patient/family education;Passive  range of motion;Manual techniques   PT Next Visit Plan add exercises        Problem List Patient Active Problem List   Diagnosis Date Noted  . Chronic low back pain 07/18/2014  . Knee pain, acute 07/18/2014  . Right ankle pain 01/27/2014  . Great toe pain 01/27/2014  . Sciatica 01/10/2012  . Thoracic or lumbosacral neuritis or radiculitis, unspecified 12/15/2011  . Ulnar neuropathy at elbow 11/14/2011  . Disturbance of skin sensation 11/14/2011    Sumner Boast, PT 11/10/2014, 10:24 AM  [address] [place], [name], [address] Phone: [phone]   Fax:  [phone]

## 2014-11-12 ENCOUNTER — Encounter: Payer: Self-pay | Admitting: Physical Therapy

## 2014-11-12 ENCOUNTER — Ambulatory Visit: Payer: Medicare Other | Admitting: Physical Therapy

## 2014-11-12 DIAGNOSIS — M659 Synovitis and tenosynovitis, unspecified: Secondary | ICD-10-CM

## 2014-11-12 DIAGNOSIS — M25561 Pain in right knee: Secondary | ICD-10-CM | POA: Diagnosis not present

## 2014-11-12 DIAGNOSIS — M7651 Patellar tendinitis, right knee: Secondary | ICD-10-CM

## 2014-11-12 NOTE — Therapy (Signed)
[place], [name], [address] Phone: [phone]   Fax:  [phone]  Physical Therapy Treatment  Patient Details  Name: Glenn Robertson MRN: [id number] Date of Birth: July 25, 1966 Referring Provider:  Charlett Blake, MD  Encounter Date: 11/12/2014    Past Medical History  Diagnosis Date  . Diabetes mellitus   . Neuromuscular disorder   . Allergy   . Asthma   . Chronic pain   . Depression     Past Surgical History  Procedure Laterality Date  . Arm surgery  1/12    rt ulnar nerve decompression  . Epidural block injection      several  . Ulnar nerve transposition  01/19/2012    Procedure: ULNAR NERVE DECOMPRESSION/TRANSPOSITION;  Surgeon: Cammie Sickle., MD;  Location: [facility];  Service: Orthopedics;  Laterality: Left;  ulnar nerve decompression vs transposition left elbow    There were no vitals filed for this visit.  Visit Diagnosis:  Patellar tendonitis of right knee  Tenosynovitis, wrist      Subjective Assessment - 11/12/14 0802    Currently in Pain? Yes   Pain Score 3    Pain Location Hand   Multiple Pain Sites No                       [facility] Adult PT Treatment/Exercise - 11/12/14 0001    Exercises   Exercises Knee/Hip   Knee/Hip Exercises: Aerobic   Elliptical R= 7, I = 13 x 6 minutes   Isokinetic NuStep L6 x 6 minutes   Knee/Hip Exercises: Machines for Strengthening   Cybex Leg Press 70# VMO  2x15   Hand Exercises   Other Hand Exercises velcro board   Other Hand Exercises farmers carry 55# x 2 minutes   Modalities   Modalities Paraffin;Iontophoresis   Iontophoresis   Type of Iontophoresis Dexamethasone   Location right patellar tendon   Dose 80 mA   Time 4 hour patch   LUE Paraffin   Number Minutes Paraffin 10 Minutes   LUE Paraffin Location Hand                  PT Short Term Goals - 11/06/14 0939    PT  SHORT TERM GOAL #1   Title independent with RICE   Time 2   Period Weeks   Status New           PT Long Term Goals - 11/06/14 0940    PT LONG TERM GOAL #1   Title independent with HEP   Time 8   Period Weeks   Status New   PT LONG TERM GOAL #2   Title increase left grip strength to 75#   Time 8   Period Weeks   Status New   PT LONG TERM GOAL #3   Title increase right knee ROM to 0-125 degrees flexion   Time 8   Period Weeks   PT LONG TERM GOAL #4   Title ascend and descend stairs without pain    Time 8   Period Weeks   Status New   PT LONG TERM GOAL #5   Title grip book and or lift with left hand without pain   Time 8   Period Weeks   Status New               Problem List Patient Active Problem List  Diagnosis Date Noted  . Chronic low back pain 07/18/2014  . Knee pain, acute 07/18/2014  . Right ankle pain 01/27/2014  . Great toe pain 01/27/2014  . Sciatica 01/10/2012  . Thoracic or lumbosacral neuritis or radiculitis, unspecified 12/15/2011  . Ulnar neuropathy at elbow 11/14/2011  . Disturbance of skin sensation 11/14/2011    Samamtha Tiegs PTA 11/12/2014, 8:45 AM  [address] [place], [name], [address] Phone: [phone]   Fax:  [phone]

## 2014-11-13 ENCOUNTER — Telehealth: Payer: Self-pay | Admitting: *Deleted

## 2014-11-13 NOTE — Telephone Encounter (Signed)
Mr Edword is asking if he can have an x ray of his right knee

## 2014-11-17 ENCOUNTER — Encounter: Payer: Self-pay | Admitting: Physical Therapy

## 2014-11-17 ENCOUNTER — Ambulatory Visit: Payer: Medicare Other | Admitting: Physical Therapy

## 2014-11-17 DIAGNOSIS — M7651 Patellar tendinitis, right knee: Secondary | ICD-10-CM

## 2014-11-17 DIAGNOSIS — M25561 Pain in right knee: Secondary | ICD-10-CM | POA: Diagnosis not present

## 2014-11-17 DIAGNOSIS — M659 Synovitis and tenosynovitis, unspecified: Secondary | ICD-10-CM

## 2014-11-17 NOTE — Telephone Encounter (Signed)
You can offer him a referral to [facility] for evaluation

## 2014-11-17 NOTE — Therapy (Signed)
[place], [name], [address] Phone: [phone]   Fax:  [phone]  Physical Therapy Treatment  Patient Details  Name: Glenn Robertson MRN: [id number] Date of Birth: 09/12/1965 Referring Provider:  Charlett Blake, MD  Encounter Date: 11/17/2014      PT End of Session - 11/17/14 1059    Visit Number 4   Number of Visits 16   Date for PT Re-Evaluation 01/06/15   PT Start Time 1017   PT Stop Time 1100   PT Time Calculation (min) 43 min   Behavior During Therapy [facility] for tasks assessed/performed      Past Medical History  Diagnosis Date  . Diabetes mellitus   . Neuromuscular disorder   . Allergy   . Asthma   . Chronic pain   . Depression     Past Surgical History  Procedure Laterality Date  . Arm surgery  1/12    rt ulnar nerve decompression  . Epidural block injection      several  . Ulnar nerve transposition  01/19/2012    Procedure: ULNAR NERVE DECOMPRESSION/TRANSPOSITION;  Surgeon: Cammie Sickle., MD;  Location: [facility];  Service: Orthopedics;  Laterality: Left;  ulnar nerve decompression vs transposition left elbow    There were no vitals filed for this visit.  Visit Diagnosis:  Patellar tendonitis of right knee  Tenosynovitis, wrist      Subjective Assessment - 11/17/14 1019    Symptoms Doing alright.   Currently in Pain? Yes   Pain Score 1    Pain Location Knee   Multiple Pain Sites No                       [facility] Adult PT Treatment/Exercise - 11/17/14 0001    Ambulation/Gait   Ambulation/Gait Yes   Stairs Yes   Stair Management Technique One rail Right;One rail Left   Number of Stairs 60   Exercises   Exercises Knee/Hip   Knee/Hip Exercises: Aerobic   Elliptical 6 minutes  incline 10;resistance 6   Isokinetic NuStep L6 x 6 minutes   Knee/Hip Exercises: Machines for Strengthening   Cybex Knee Extension 20# 2x15   Cybex Knee Flexion 35# 2x15   Cybex Leg Press 80# VMO 2x15   Knee/Hip Exercises: Supine   Straight Leg Raises 2 sets;10 reps  5#                  PT Short Term Goals - 11/17/14 1146    PT SHORT TERM GOAL #1   Title independent with RICE   Time 2   Period Weeks   Status Achieved           PT Long Term Goals - 11/17/14 1146    PT LONG TERM GOAL #1   Title independent with HEP   Time 8   Period Weeks   Status On-going   PT LONG TERM GOAL #2   Title increase left grip strength to 75#   Time 8   Period Weeks   Status On-going   PT LONG TERM GOAL #3   Title increase right knee ROM to 0-125 degrees flexion   Time 8   Period Weeks   Status On-going   PT LONG TERM GOAL #4   Title ascend and descend stairs without pain    Time 8   Period Weeks   Status On-going   PT  LONG TERM GOAL #5   Title grip book and or lift with left hand without pain   Period Weeks   Status On-going               Problem List Patient Active Problem List   Diagnosis Date Noted  . Chronic low back pain 07/18/2014  . Knee pain, acute 07/18/2014  . Right ankle pain 01/27/2014  . Great toe pain 01/27/2014  . Sciatica 01/10/2012  . Thoracic or lumbosacral neuritis or radiculitis, unspecified 12/15/2011  . Ulnar neuropathy at elbow 11/14/2011  . Disturbance of skin sensation 11/14/2011    Jeston Junkins PTA 11/17/2014, 11:47 AM  [address] [place], [name], [address] Phone: [phone]   Fax:  [phone]

## 2014-11-17 NOTE — Telephone Encounter (Signed)
Glenn Robertson is not requesting an MRI of his right knee to "get to the bottom of his problem"

## 2014-11-18 NOTE — Telephone Encounter (Signed)
He will talk to PT tomorrow and see where they stand and let us know if he wants to pursue the ortho referral.

## 2014-11-19 ENCOUNTER — Encounter: Payer: Self-pay | Admitting: Physical Therapy

## 2014-11-19 ENCOUNTER — Ambulatory Visit: Payer: Medicare Other | Admitting: Physical Therapy

## 2014-11-19 DIAGNOSIS — M25561 Pain in right knee: Secondary | ICD-10-CM | POA: Diagnosis not present

## 2014-11-19 DIAGNOSIS — M659 Synovitis and tenosynovitis, unspecified: Secondary | ICD-10-CM

## 2014-11-19 DIAGNOSIS — M7651 Patellar tendinitis, right knee: Secondary | ICD-10-CM

## 2014-11-19 NOTE — Therapy (Signed)
[place], [name], [address] Phone: [phone]   Fax:  [phone]  Physical Therapy Treatment  Patient Details  Name: Glenn Robertson MRN: [id number] Date of Birth: 29-Jan-1966 Referring Provider:  Charlett Blake, MD  Encounter Date: 11/19/2014      PT End of Session - 11/19/14 1038    Visit Number 5   Number of Visits 16   Date for PT Re-Evaluation 01/06/15   PT Start Time 0938   PT Stop Time 1034   PT Time Calculation (min) 56 min   Activity Tolerance Patient tolerated treatment well      Past Medical History  Diagnosis Date  . Diabetes mellitus   . Neuromuscular disorder   . Allergy   . Asthma   . Chronic pain   . Depression     Past Surgical History  Procedure Laterality Date  . Arm surgery  1/12    rt ulnar nerve decompression  . Epidural block injection      several  . Ulnar nerve transposition  01/19/2012    Procedure: ULNAR NERVE DECOMPRESSION/TRANSPOSITION;  Surgeon: Cammie Sickle., MD;  Location: [facility];  Service: Orthopedics;  Laterality: Left;  ulnar nerve decompression vs transposition left elbow    There were no vitals filed for this visit.  Visit Diagnosis:  Patellar tendonitis of right knee  Tenosynovitis, wrist      Subjective Assessment - 11/19/14 0941    Symptoms Doing alright.   Currently in Pain? Yes   Pain Score 1    Pain Location Hand  thumb   Multiple Pain Sites No            [facility] PT Assessment - 11/19/14 0001    ROM / Strength   AROM / PROM / Strength Strength   Strength   Strength Assessment Site Hand   Right/Left hand Left   Grip (lbs) 82                   [facility] Adult PT Treatment/Exercise - 11/19/14 0001    Exercises   Exercises Knee/Hip;Hand;Shoulder   Knee/Hip Exercises: Plyometrics   Box Circuit 5 reps   Other Plyometric Exercises jump to BOSU   Shoulder Exercises: ROM/Strengthening    UBE (Upper Arm Bike) 6 minutes  86fwd/3bk   Hand Exercises   Theraputty Flatten   Theraputty - Flatten orange   Modalities   Modalities Iontophoresis;Electrical Stimulation;Paraffin   LUE Paraffin   Number Minutes Paraffin 10 Minutes   LUE Paraffin Location Hand                  PT Short Term Goals - 11/17/14 1146    PT SHORT TERM GOAL #1   Title independent with RICE   Time 2   Period Weeks   Status Achieved           PT Long Term Goals - 11/19/14 1042    PT LONG TERM GOAL #1   Title independent with HEP   Time 8   Period Weeks   Status On-going   PT LONG TERM GOAL #2   Title increase left grip strength to 75#   Time 8   Period Weeks   Status Achieved   PT LONG TERM GOAL #3   Title increase right knee ROM to 0-125 degrees flexion   Time 8   Period Weeks   Status On-going   PT LONG  TERM GOAL #4   Title ascend and descend stairs without pain    Time 8   Period Weeks   Status Achieved   PT LONG TERM GOAL #5   Title grip book and or lift with left hand without pain   Time 8   Period Weeks   Status On-going               Problem List Patient Active Problem List   Diagnosis Date Noted  . Chronic low back pain 07/18/2014  . Knee pain, acute 07/18/2014  . Right ankle pain 01/27/2014  . Great toe pain 01/27/2014  . Sciatica 01/10/2012  . Thoracic or lumbosacral neuritis or radiculitis, unspecified 12/15/2011  . Ulnar neuropathy at elbow 11/14/2011  . Disturbance of skin sensation 11/14/2011    Tamya Denardo PTA 11/19/2014, 10:48 AM  [place] [address] [place], [name], [address] Phone: [phone]   Fax:  [phone]

## 2014-11-24 ENCOUNTER — Telehealth: Payer: Self-pay | Admitting: *Deleted

## 2014-11-24 DIAGNOSIS — M7651 Patellar tendinitis, right knee: Secondary | ICD-10-CM

## 2014-11-24 DIAGNOSIS — M25561 Pain in right knee: Secondary | ICD-10-CM

## 2014-11-24 NOTE — Telephone Encounter (Signed)
Glenn Robertson has called back and asked that we go ahead with the ortho referral.  Order placed. Glenn Robertson notified

## 2014-11-25 ENCOUNTER — Encounter: Payer: Self-pay | Admitting: Physical Therapy

## 2014-11-25 ENCOUNTER — Ambulatory Visit: Payer: Medicare Other | Admitting: Physical Therapy

## 2014-11-25 DIAGNOSIS — M659 Synovitis and tenosynovitis, unspecified: Secondary | ICD-10-CM

## 2014-11-25 DIAGNOSIS — M25561 Pain in right knee: Secondary | ICD-10-CM | POA: Diagnosis not present

## 2014-11-25 DIAGNOSIS — M7651 Patellar tendinitis, right knee: Secondary | ICD-10-CM

## 2014-11-25 NOTE — Therapy (Signed)
[place], [name], [address] Phone: [phone]   Fax:  [phone]  Physical Therapy Treatment  Patient Details  Name: Glenn Robertson MRN: [id number] Date of Birth: 1966/01/29 Referring Provider:  Charlett Blake, MD  Encounter Date: 11/25/2014      PT End of Session - 11/25/14 0903    Visit Number 6   Number of Visits 16   Date for PT Re-Evaluation 01/06/15   PT Start Time 0803   PT Stop Time 0900   PT Time Calculation (min) 57 min   Activity Tolerance Patient tolerated treatment well   Behavior During Therapy [facility] for tasks assessed/performed      Past Medical History  Diagnosis Date  . Diabetes mellitus   . Neuromuscular disorder   . Allergy   . Asthma   . Chronic pain   . Depression     Past Surgical History  Procedure Laterality Date  . Arm surgery  1/12    rt ulnar nerve decompression  . Epidural block injection      several  . Ulnar nerve transposition  01/19/2012    Procedure: ULNAR NERVE DECOMPRESSION/TRANSPOSITION;  Surgeon: Cammie Sickle., MD;  Location: [facility];  Service: Orthopedics;  Laterality: Left;  ulnar nerve decompression vs transposition left elbow    There were no vitals filed for this visit.  Visit Diagnosis:  Patellar tendonitis of right knee  Tenosynovitis, wrist      Subjective Assessment - 11/25/14 0805    Symptoms Sore.   Pain Score 2    Pain Location Hand   Pain Orientation Right            [facility] PT Assessment - 11/25/14 0001    ROM / Strength   AROM / PROM / Strength Strength   Strength   Strength Assessment Site Hand   Right/Left hand Left   Grip (lbs) 70                   [facility] Adult PT Treatment/Exercise - 11/25/14 0001    Exercises   Exercises Knee/Hip;Hand;Shoulder   Knee/Hip Exercises: Aerobic   Elliptical 6 minutes   Isokinetic UBE 6 minutes  55fwd/3bk   Shoulder Exercises: Seated    Row 15 reps  2 sets   Row Weight (lbs) 25   Other Seated Exercises lat pull 25# 2x15   Modalities   Modalities Paraffin;Electrical Stimulation   Electrical Stimulation   Electrical Stimulation Location left thumb   Electrical Stimulation Parameters premod   Electrical Stimulation Goals Pain   LUE Paraffin   Number Minutes Paraffin 10 Minutes   LUE Paraffin Location Hand                  PT Short Term Goals - 11/17/14 1146    PT SHORT TERM GOAL #1   Title independent with RICE   Time 2   Period Weeks   Status Achieved           PT Long Term Goals - 11/19/14 1042    PT LONG TERM GOAL #1   Title independent with HEP   Time 8   Period Weeks   Status On-going   PT LONG TERM GOAL #2   Title increase left grip strength to 75#   Time 8   Period Weeks   Status Achieved   PT LONG TERM GOAL #3   Title increase right knee ROM  to 0-125 degrees flexion   Time 8   Period Weeks   Status On-going   PT LONG TERM GOAL #4   Title ascend and descend stairs without pain    Time 8   Period Weeks   Status Achieved   PT LONG TERM GOAL #5   Title grip book and or lift with left hand without pain   Time 8   Period Weeks   Status On-going               Plan - 11/25/14 3570    Clinical Impression Statement No pain reported with knee.  Reports 12 mile bike ride without increased symptoms.  Decreased grip strength today.   Pt will benefit from skilled therapeutic intervention in order to improve on the following deficits Decreased mobility;Decreased range of motion;Difficulty walking;Impaired flexibility;Improper body mechanics;Pain;Impaired UE functional use;Decreased strength   Rehab Potential Good   PT Frequency 2x / week   PT Duration 8 weeks   PT Treatment/Interventions Electrical Stimulation;Ultrasound;Moist Heat;Therapeutic exercise;Therapeutic activities;Patient/family education;Passive range of motion;Manual techniques;Parrafin   PT Next Visit Plan  Continue to work on pain management.   Consulted and Agree with Plan of Care Patient        Problem List Patient Active Problem List   Diagnosis Date Noted  . Chronic low back pain 07/18/2014  . Knee pain, acute 07/18/2014  . Right ankle pain 01/27/2014  . Great toe pain 01/27/2014  . Sciatica 01/10/2012  . Thoracic or lumbosacral neuritis or radiculitis, unspecified 12/15/2011  . Ulnar neuropathy at elbow 11/14/2011  . Disturbance of skin sensation 11/14/2011    Joncarlo Friberg PTA 11/25/2014, 9:04 AM  [address] [place], [name], [address] Phone: [phone]   Fax:  [phone]

## 2014-11-27 ENCOUNTER — Ambulatory Visit: Payer: Medicare Other | Admitting: Physical Therapy

## 2014-12-03 ENCOUNTER — Encounter: Payer: Self-pay | Admitting: Physical Therapy

## 2014-12-03 ENCOUNTER — Ambulatory Visit: Payer: Medicare Other | Attending: Physical Medicine & Rehabilitation | Admitting: Physical Therapy

## 2014-12-03 DIAGNOSIS — M25561 Pain in right knee: Secondary | ICD-10-CM | POA: Diagnosis present

## 2014-12-03 DIAGNOSIS — M7651 Patellar tendinitis, right knee: Secondary | ICD-10-CM

## 2014-12-03 DIAGNOSIS — M659 Synovitis and tenosynovitis, unspecified: Secondary | ICD-10-CM

## 2014-12-03 NOTE — Therapy (Signed)
[place], [name], [address] Phone: [phone]   Fax:  [phone]  Physical Therapy Treatment  Patient Details  Name: Glenn Robertson MRN: [id number] Date of Birth: 1966/05/08 Referring Provider:  Charlett Blake, MD  Encounter Date: 12/03/2014    Past Medical History  Diagnosis Date  . Diabetes mellitus   . Neuromuscular disorder   . Allergy   . Asthma   . Chronic pain   . Depression     Past Surgical History  Procedure Laterality Date  . Arm surgery  1/12    rt ulnar nerve decompression  . Epidural block injection      several  . Ulnar nerve transposition  01/19/2012    Procedure: ULNAR NERVE DECOMPRESSION/TRANSPOSITION;  Surgeon: Cammie Sickle., MD;  Location: [facility];  Service: Orthopedics;  Laterality: Left;  ulnar nerve decompression vs transposition left elbow    There were no vitals filed for this visit.  Visit Diagnosis:  No diagnosis found.      Subjective Assessment - 12/03/14 1529    Subjective I used my Iphone today and didn't feel anything.   Currently in Pain? No/denies            [facility] PT Assessment - 12/03/14 0001    ROM / Strength   AROM / PROM / Strength AROM   AROM   AROM Assessment Site Knee   Right/Left Knee Right                   [facility] Adult PT Treatment/Exercise - 12/03/14 0001    Ambulation/Gait   Ambulation Surface Level;Indoor   Stairs Yes   Gait Comments running in hallway   Exercises   Exercises Knee/Hip;Hand;Shoulder   Knee/Hip Exercises: Aerobic   Stationary Bike 6 minutes   Elliptical 6 minutes   Knee/Hip Exercises: Plyometrics   Other Plyometric Exercises various height box jumps   Hand Exercises   Other Hand Exercises 7, 8# farmers carry                  PT Short Term Goals - 11/17/14 1146    PT SHORT TERM GOAL #1   Title independent with RICE   Time 2   Period Weeks   Status  Achieved           PT Long Term Goals - 11/19/14 1042    PT LONG TERM GOAL #1   Title independent with HEP   Time 8   Period Weeks   Status On-going   PT LONG TERM GOAL #2   Title increase left grip strength to 75#   Time 8   Period Weeks   Status Achieved   PT LONG TERM GOAL #3   Title increase right knee ROM to 0-125 degrees flexion   Time 8   Period Weeks   Status On-going   PT LONG TERM GOAL #4   Title ascend and descend stairs without pain    Time 8   Period Weeks   Status Achieved   PT LONG TERM GOAL #5   Title grip book and or lift with left hand without pain   Time 8   Period Weeks   Status On-going               Problem List Patient Active Problem List   Diagnosis Date Noted  . Chronic low back pain 07/18/2014  . Knee pain, acute  07/18/2014  . Right ankle pain 01/27/2014  . Great toe pain 01/27/2014  . Sciatica 01/10/2012  . Thoracic or lumbosacral neuritis or radiculitis, unspecified 12/15/2011  . Ulnar neuropathy at elbow 11/14/2011  . Disturbance of skin sensation 11/14/2011    Manases Etchison PTA 12/03/2014, 5:11 PM  [place] [address] [place], [name], [address] Phone: [phone]   Fax:  [phone]

## 2014-12-05 ENCOUNTER — Encounter: Payer: Self-pay | Admitting: Physical Medicine & Rehabilitation

## 2014-12-05 ENCOUNTER — Encounter: Payer: Medicare Other | Attending: Physical Medicine & Rehabilitation

## 2014-12-05 ENCOUNTER — Ambulatory Visit (HOSPITAL_BASED_OUTPATIENT_CLINIC_OR_DEPARTMENT_OTHER): Payer: Medicare Other | Admitting: Physical Medicine & Rehabilitation

## 2014-12-05 VITALS — BP 120/72 | HR 88 | Resp 14

## 2014-12-05 DIAGNOSIS — G8929 Other chronic pain: Secondary | ICD-10-CM | POA: Insufficient documentation

## 2014-12-05 DIAGNOSIS — M25561 Pain in right knee: Secondary | ICD-10-CM | POA: Diagnosis not present

## 2014-12-05 DIAGNOSIS — M7651 Patellar tendinitis, right knee: Secondary | ICD-10-CM | POA: Diagnosis not present

## 2014-12-05 DIAGNOSIS — M545 Low back pain: Secondary | ICD-10-CM | POA: Insufficient documentation

## 2014-12-05 NOTE — Progress Notes (Signed)
Subjective:    Patient ID: Glenn Robertson, male    DOB: 1965/10/11, 49 y.o.   MRN: [id number]  HPI Quad strengthening, running techniques, ITB stretch with PT, D/Ced yesterday  RIght knee feels better Pain Inventory Average Pain 4 Pain Right Now 0 My pain is intermittent, dull, aching and hurts to the touch  In the last 24 hours, has pain interfered with the following? General activity 4 Relation with others 0 Enjoyment of life 4 What TIME of day is your pain at its worst? daytime Sleep (in general) Fair  Pain is worse with: walking, bending, standing and some activites Pain improves with: heat/ice Relief from Meds: 0  Mobility walk without assistance how many minutes can you walk? 20 ability to climb steps?  yes do you drive?  yes  Function disabled: date disabled .  Neuro/Psych No problems in this area  Prior Studies Any changes since last visit?  no  Physicians involved in your care Any changes since last visit?  no   Family History  Problem Relation Age of Onset  . Diabetes Father   . Diabetes Paternal Uncle    History   Social History  . Marital Status: Single    Spouse Name: N/A  . Number of Children: N/A  . Years of Education: N/A   Social History Main Topics  . Smoking status: Never Smoker   . Smokeless tobacco: Never Used  . Alcohol Use: No  . Drug Use: No  . Sexual Activity: Not on file   Other Topics Concern  . None   Social History Narrative   Past Surgical History  Procedure Laterality Date  . Arm surgery  1/12    rt ulnar nerve decompression  . Epidural block injection      several  . Ulnar nerve transposition  01/19/2012    Procedure: ULNAR NERVE DECOMPRESSION/TRANSPOSITION;  Surgeon: Cammie Sickle., MD;  Location: [facility];  Service: Orthopedics;  Laterality: Left;  ulnar nerve decompression vs transposition left elbow   Past Medical History  Diagnosis Date  . Diabetes mellitus   . Neuromuscular  disorder   . Allergy   . Asthma   . Chronic pain   . Depression    BP 120/72 mmHg  Pulse 88  Resp 14  SpO2 97%  Opioid Risk Score:   Fall Risk Score: Low Fall Risk (0-5 points)`1  Depression screen PHQ 2/9  Depression screen PHQ 2/9 12/05/2014  Decreased Interest 2  Down, Depressed, Hopeless 3  PHQ - 2 Score 5  Altered sleeping 0  Tired, decreased energy 0  Change in appetite 0  Feeling bad or failure about yourself  3  Trouble concentrating 3  Moving slowly or fidgety/restless 1  Suicidal thoughts 1  PHQ-9 Score 13     Review of Systems  Constitutional: Negative.   HENT: Negative.   Eyes: Negative.   Respiratory: Negative.   Cardiovascular: Negative.   Gastrointestinal: Negative.   Endocrine: Negative.   Genitourinary: Negative.   Musculoskeletal: Positive for myalgias and arthralgias.  Skin: Negative.   Allergic/Immunologic: Negative.   Neurological: Negative.   Hematological: Negative.   Psychiatric/Behavioral: Negative.        Objective:   Physical Exam  Musculoskeletal:       Right knee: He exhibits normal range of motion, no swelling, no ecchymosis, no deformity, no LCL laxity, normal patellar mobility and no MCL laxity. No medial joint line, no lateral joint line, no MCL, no LCL  and no patellar tendon tenderness noted.       Left knee: He exhibits normal range of motion, no swelling, no effusion, no deformity, no LCL laxity, normal patellar mobility and no MCL laxity. No medial joint line, no lateral joint line, no MCL, no LCL and no patellar tendon tenderness noted.  Negative grind test bilateral  No evidence of bilateral knee effusion No evidence of knee erythema   Motor strength is 5/5 bilateral hip flexor knee extension and ankle dorsi flexion plantar flexion       Assessment & Plan:   1. Right patellar tendinitis improved after physical therapy program which consisted of stretching strengthening. At this point he can return to a alternating  walk and jog schedule. He has an app which outlines a gradual return to running with a goal of 3 miles in 9 weeks time. I think this is an appropriate goal although the patient would be satisfied even if he got back to running about a mile and a half  Return to clinic 3 months

## 2014-12-05 NOTE — Patient Instructions (Signed)
Cont Home exercise program as outlined by PT,    Gradual return to running

## 2014-12-09 ENCOUNTER — Telehealth: Payer: Self-pay | Admitting: *Deleted

## 2014-12-09 NOTE — Telephone Encounter (Signed)
Glenn Robertson is calling again about his knee pain-- it is back after he ran in a ?5k.  He says that he only ran 9/10 mil and walked but he did this about 8 times.  The pain is back.  What does he need to do?  Does he need a patellar band/MRI or what?  (He did not mention if he saw the ortho after a referral was placed 11/24/14)

## 2014-12-09 NOTE — Telephone Encounter (Signed)
Notified Tracker.  He says it wasn't exactly a race.  He has not heard from the ortho and so I will send message to Corene Cornea to follow up on this referral.

## 2014-12-09 NOTE — Telephone Encounter (Signed)
Jamon needs to follow my instructions, I did not intend for him to start at 5K1 week after I saw him.  I think he will need to stop running for 2 weeks Then he'll need to slowly increase his running  Over the course of a month or so. He showed me an applicationhe had but he obviously did not follow

## 2014-12-19 ENCOUNTER — Other Ambulatory Visit: Payer: Self-pay | Admitting: Physical Medicine & Rehabilitation

## 2015-01-16 ENCOUNTER — Telehealth: Payer: Self-pay | Admitting: *Deleted

## 2015-01-16 NOTE — Telephone Encounter (Signed)
Glenn Robertson is calling to ask if it will be ok for him to ride his bike outside.  He cannot run and he needs some form of exercise.  This is in relation to his back.

## 2015-01-19 NOTE — Telephone Encounter (Signed)
I called Glenn Robertson and let him know it would be okay to ride his bike.

## 2015-01-19 NOTE — Telephone Encounter (Signed)
Should be ok

## 2015-02-24 ENCOUNTER — Encounter: Payer: Medicare Other | Attending: Physical Medicine & Rehabilitation

## 2015-02-24 ENCOUNTER — Encounter: Payer: Self-pay | Admitting: Physical Medicine & Rehabilitation

## 2015-02-24 ENCOUNTER — Ambulatory Visit (HOSPITAL_BASED_OUTPATIENT_CLINIC_OR_DEPARTMENT_OTHER): Payer: Medicare Other | Admitting: Physical Medicine & Rehabilitation

## 2015-02-24 VITALS — BP 105/66 | HR 81 | Resp 14

## 2015-02-24 DIAGNOSIS — M5417 Radiculopathy, lumbosacral region: Secondary | ICD-10-CM | POA: Diagnosis not present

## 2015-02-24 DIAGNOSIS — F200 Paranoid schizophrenia: Secondary | ICD-10-CM | POA: Diagnosis not present

## 2015-02-24 DIAGNOSIS — M2241 Chondromalacia patellae, right knee: Secondary | ICD-10-CM | POA: Diagnosis not present

## 2015-02-24 DIAGNOSIS — G8929 Other chronic pain: Secondary | ICD-10-CM | POA: Diagnosis not present

## 2015-02-24 DIAGNOSIS — M545 Low back pain: Secondary | ICD-10-CM | POA: Diagnosis present

## 2015-02-24 DIAGNOSIS — M25561 Pain in right knee: Secondary | ICD-10-CM | POA: Insufficient documentation

## 2015-02-24 DIAGNOSIS — M2242 Chondromalacia patellae, left knee: Secondary | ICD-10-CM | POA: Diagnosis not present

## 2015-02-24 MED ORDER — GABAPENTIN 300 MG PO CAPS: 300.0000 mg | ORAL_CAPSULE | Freq: Two times a day (BID) | ORAL | Status: DC

## 2015-02-24 NOTE — Progress Notes (Signed)
Subjective:    Patient ID: Glenn Robertson, male    DOB: Aug 24, 1966, 49 y.o.   MRN: [id number]  HPI Left foot numbness, lateral border  Sitting too long aggravates pain Driving too long  49 year old male with history of schizophrenia. History of complaint today is left foot numbness lateral border. he has some problems on the right side as well Patient has been running but this has aggravated some knee pain , diagnosed with chondromalacia patella. He states that his been beneficial for his mental illness where is walking seems to aggravate this. He also has tried bicycling which once again helps with his mental illness however he is afraid that at traveling at high speed if he loses focus he may end up injuring himself He is on the [place] rather than on [place].  Also discussed exercise program including weight lifting. He is doing some squats. I advised concentrating on leg press is instead Also discussed that Pilates or yoga would be helpful for his back as well   Other past medical history includes depression and anxiety. Pain Inventory Average Pain 0 Pain Right Now 0 My pain is intermittent, constant, sharp, dull and tingling  In the last 24 hours, has pain interfered with the following? General activity 0 Relation with others 0 Enjoyment of life 4 What TIME of day is your pain at its worst? varies Sleep (in general) Fair  Pain is worse with: sitting and standing Pain improves with: rest and heat/ice Relief from Meds: 0  Mobility Do you have any goals in this area?  no  Function Do you have any goals in this area?  no  Neuro/Psych numbness tingling  Prior Studies Any changes since last visit?  no  Physicians involved in your care Any changes since last visit?  no   Family History  Problem Relation Age of Onset  . Diabetes Father   . Diabetes Paternal Uncle    History   Social History  . Marital Status: Single    Spouse Name: N/A  . Number of  Children: N/A  . Years of Education: N/A   Social History Main Topics  . Smoking status: Never Smoker   . Smokeless tobacco: Never Used  . Alcohol Use: No  . Drug Use: No  . Sexual Activity: Not on file   Other Topics Concern  . None   Social History Narrative   Past Surgical History  Procedure Laterality Date  . Arm surgery  1/12    rt ulnar nerve decompression  . Epidural block injection      several  . Ulnar nerve transposition  01/19/2012    Procedure: ULNAR NERVE DECOMPRESSION/TRANSPOSITION;  Surgeon: Cammie Sickle., MD;  Location: [place];  Service: Orthopedics;  Laterality: Left;  ulnar nerve decompression vs transposition left elbow   Past Medical History  Diagnosis Date  . Diabetes mellitus   . Neuromuscular disorder   . Allergy   . Asthma   . Chronic pain   . Depression    BP 105/66 mmHg  Pulse 81  Resp 14  SpO2 98%  Opioid Risk Score:   Fall Risk Score: Moderate Fall Risk (6-13 points)`1  Depression screen PHQ 2/9  Depression screen PHQ 2/9 12/05/2014  Decreased Interest 2  Down, Depressed, Hopeless 3  PHQ - 2 Score 5  Altered sleeping 0  Tired, decreased energy 0  Change in appetite 0  Feeling bad or failure about yourself  3  Trouble concentrating 3  Moving slowly or fidgety/restless 1  Suicidal thoughts 1  PHQ-9 Score 13     Review of Systems  Neurological: Positive for numbness.       Tingling  All other systems reviewed and are negative.      Objective:   Physical Exam  Constitutional: He is oriented to person, place, and time. He appears well-developed and well-nourished.  Neurological: He is alert and oriented to person, place, and time.  Reflex Scores:      Patellar reflexes are 2+ on the right side and 2+ on the left side.      Achilles reflexes are 1+ on the right side and 1+ on the left side. Decreased sensation in the left S1 dermatomal distribution to pinprick    Psychiatric: He has a normal mood  and affect.  Nursing note and vitals reviewed.   He has 5/5 strength in bilateral hip flexors knee extensors ankle dorsiflexor and plantar flexor He has good musculature in the quadriceps as well as the gastrocsoleus complex. Sensation is intact to light touch and pinprick in both lower extremitiesException of left S1 distribution Deep tendon reflexes are normal in both lower extremities Lumbar spine range of motion is normal     Assessment & Plan:  1. Left S1 radiculopathy. He's had some intermittent symptoms in the past he is now having increasing symptoms. Given his neurologic findings with think MRI should yield some additional diagnostic information. If there is some evidence of S1 nerve root compression or irritation from L5-S1 disc would consider left S1 transforaminal epidural steroid injection. Return to clinic in one month to review imaging studies and further treatment options Recommended patient not perform squats he can perform leg presses instead. I do think he can do walking or bicycling.   2. Chondromalacia patella. I think bicycling on a stationary bike would be helpful for him he states that from a psychological standpoint he does much better if he is out on a path. He may continue Meloxicam  3. Schizophrenia paranoid type with depression and anxiety. Follow-up with psychology and psychiatry.

## 2015-03-05 ENCOUNTER — Ambulatory Visit: Payer: Medicare Other | Admitting: Physical Medicine & Rehabilitation

## 2015-03-09 ENCOUNTER — Telehealth: Payer: Self-pay | Admitting: *Deleted

## 2015-03-09 NOTE — Telephone Encounter (Signed)
Glenn Robertson is calling about when is he going to have his MRI.  It looks like it was ordered on 02/24/15.

## 2015-03-16 ENCOUNTER — Ambulatory Visit
Admission: RE | Admit: 2015-03-16 | Discharge: 2015-03-16 | Disposition: A | Discharge status: Home / Self Care | Payer: Medicare Other | Source: Ambulatory Visit | Attending: Physical Medicine & Rehabilitation | Admitting: Physical Medicine & Rehabilitation

## 2015-03-16 DIAGNOSIS — M5417 Radiculopathy, lumbosacral region: Secondary | ICD-10-CM

## 2015-03-24 ENCOUNTER — Telehealth: Payer: Self-pay | Admitting: *Deleted

## 2015-03-24 ENCOUNTER — Encounter: Payer: Medicare Other | Attending: Physical Medicine & Rehabilitation

## 2015-03-24 ENCOUNTER — Ambulatory Visit: Payer: Medicare Other | Admitting: Physical Medicine & Rehabilitation

## 2015-03-24 DIAGNOSIS — G8929 Other chronic pain: Secondary | ICD-10-CM | POA: Insufficient documentation

## 2015-03-24 DIAGNOSIS — M545 Low back pain: Secondary | ICD-10-CM | POA: Insufficient documentation

## 2015-03-24 DIAGNOSIS — M25561 Pain in right knee: Secondary | ICD-10-CM | POA: Insufficient documentation

## 2015-03-24 NOTE — Telephone Encounter (Signed)
Mr Dill called back.  He has been unable to get his messages.  He did not realize he missed his appt and is going to be out of the country until 04[place]06/15.  He will call and reschedule but he is asking about h s MRI results

## 2015-03-24 NOTE — Telephone Encounter (Signed)
Glenn Robertson called @10 :08 to ask what his MRI report said.  He apparently did not realize he had an appt today at 9:30 am. I have called and left him a message to please reschedule.

## 2015-03-26 NOTE — Telephone Encounter (Signed)
error 

## 2015-04-21 ENCOUNTER — Encounter: Payer: Self-pay | Admitting: Physical Medicine & Rehabilitation

## 2015-04-21 ENCOUNTER — Encounter: Payer: Medicare Other | Attending: Physical Medicine & Rehabilitation

## 2015-04-21 ENCOUNTER — Ambulatory Visit (HOSPITAL_BASED_OUTPATIENT_CLINIC_OR_DEPARTMENT_OTHER): Payer: Medicare Other | Admitting: Physical Medicine & Rehabilitation

## 2015-04-21 VITALS — BP 138/64 | HR 77

## 2015-04-21 DIAGNOSIS — M25561 Pain in right knee: Secondary | ICD-10-CM | POA: Insufficient documentation

## 2015-04-21 DIAGNOSIS — M5126 Other intervertebral disc displacement, lumbar region: Secondary | ICD-10-CM | POA: Diagnosis not present

## 2015-04-21 DIAGNOSIS — M545 Low back pain: Secondary | ICD-10-CM | POA: Diagnosis present

## 2015-04-21 DIAGNOSIS — G8929 Other chronic pain: Secondary | ICD-10-CM | POA: Insufficient documentation

## 2015-04-21 DIAGNOSIS — M5416 Radiculopathy, lumbar region: Secondary | ICD-10-CM | POA: Diagnosis not present

## 2015-04-21 MED ORDER — GABAPENTIN 400 MG PO CAPS: 400.0000 mg | ORAL_CAPSULE | Freq: Two times a day (BID) | ORAL | Status: DC

## 2015-04-21 NOTE — Progress Notes (Signed)
Subjective:    Patient ID: Glenn Robertson, male    DOB: 1966/04/17, 49 y.o.   MRN: [id number]  HPI Increased pain in the back as well as pain in the left buttock and thigh. Patient went on a long bus trip to [place] and was sitting a lot. He states that his left foot was going numb with prolonged standing while at museums. The patient has not noted any weakness in his left leg. He also notes some abnormal shoe wear on the left side at the toe. He has not fallen or tripped. He also notes some numbness at the right little toe. He's had this numbness for a number of years however.  We reviewed MRI report as well as the actual films. Explained the sagittal as well as the axial views. Patient continues to do some exercises, biking, weight lifting but nothing as strenuous as squats, military presses or dead lifts Pain Inventory Average Pain 2 Pain Right Now 2 My pain is sharp  In the last 24 hours, has pain interfered with the following? General activity 2 Relation with others 2 Enjoyment of life 7 What TIME of day is your pain at its worst? all Sleep (in general) NA  Pain is worse with: sitting and standing Pain improves with: other Relief from Meds: 0  Mobility walk without assistance  Function Do you have any goals in this area?  no  Neuro/Psych tremor  Prior Studies Any changes since last visit?  yes CT/MRI  Physicians involved in your care Any changes since last visit?  no   Family History  Problem Relation Age of Onset  . Diabetes Father   . Diabetes Paternal Uncle    Social History   Social History  . Marital Status: Single    Spouse Name: N/A  . Number of Children: N/A  . Years of Education: N/A   Social History Main Topics  . Smoking status: Never Smoker   . Smokeless tobacco: Never Used  . Alcohol Use: No  . Drug Use: No  . Sexual Activity: Not Asked   Other Topics Concern  . None   Social History Narrative   Past Surgical History  Procedure  Laterality Date  . Arm surgery  1/12    rt ulnar nerve decompression  . Epidural block injection      several  . Ulnar nerve transposition  01/19/2012    Procedure: ULNAR NERVE DECOMPRESSION/TRANSPOSITION;  Surgeon: Cammie Sickle., MD;  Location: [place];  Service: Orthopedics;  Laterality: Left;  ulnar nerve decompression vs transposition left elbow   Past Medical History  Diagnosis Date  . Diabetes mellitus   . Neuromuscular disorder   . Allergy   . Asthma   . Chronic pain   . Depression    BP 138/64 mmHg  Pulse 77  SpO2 97%  Opioid Risk Score:   Fall Risk Score:  `1  Depression screen PHQ 2/9  Depression screen [facility] 2/9 04/21/2015 12/05/2014  Decreased Interest 2 2  Down, Depressed, Hopeless 3 3  PHQ - 2 Score 5 5  Altered sleeping - 0  Tired, decreased energy - 0  Change in appetite - 0  Feeling bad or failure about yourself  - 3  Trouble concentrating - 3  Moving slowly or fidgety/restless - 1  Suicidal thoughts - 1  PHQ-9 Score - 13     Review of Systems  Neurological: Positive for tremors.  All other systems reviewed and are negative.  Objective:   Physical Exam  Constitutional: He is oriented to person, place, and time. He appears well-developed and well-nourished.  HENT:  Head: Normocephalic and atraumatic.  Eyes: Conjunctivae and EOM are normal. Pupils are equal, round, and reactive to light.  Neck: Normal range of motion.  Musculoskeletal:       Right hip: Normal.       Left hip: Normal.       Right knee: Tenderness found. Patellar tendon tenderness noted.       Left knee: Tenderness found. Patellar tendon tenderness noted.  Neurological: He is alert and oriented to person, place, and time.  Nursing note and vitals reviewed.  Patient is able to do 50 calf raises or more on each side. Sensation intact bilateral L3-L4 L5-S1 dermatomal distribution Motor strength is 5/5 bilateral hip flexors knee extensors ankle  dorsiflexors and plantar flexors No evidence of atrophy in bilateral quadriceps. There is mild atrophy of the right gastrocsoleus but not the left. Deep tendon reflexes are 1+ in bilateral patellar and Achilles Negative straight leg raising Lumbar spine range of motion is normal     Assessment & Plan:  1. L5-S1 disc with left S1 radiculitis. No motor deficits mainly pain, no sensory deficits at the current time to pinprick. Recommend left S1 epidural steroid injection. If this is not very helpful Would do L5 injection,Followed this with physical therapy if neither are helpful consider surgical referral Will also increase gabapentin to 400 mg twice a day Discussed with patient agrees with plan Over half of the 25 min visit was spent counseling and coordinating care. In regards to the right S1 symptoms there is no evidence of nerve root compression on the MRI after reviewing the films. He may have some nerve root irritation from inflammatory chemicals released by the disc however the patient states that he's had this numbness on the right little toe for years.

## 2015-04-21 NOTE — Patient Instructions (Signed)
You will need a driver for the next injection

## 2015-05-22 ENCOUNTER — Encounter: Payer: Medicare Other | Attending: Physical Medicine & Rehabilitation

## 2015-05-22 ENCOUNTER — Encounter: Payer: Self-pay | Admitting: Physical Medicine & Rehabilitation

## 2015-05-22 ENCOUNTER — Ambulatory Visit (HOSPITAL_BASED_OUTPATIENT_CLINIC_OR_DEPARTMENT_OTHER): Payer: Medicare Other | Admitting: Physical Medicine & Rehabilitation

## 2015-05-22 VITALS — BP 113/63 | HR 75 | Resp 16

## 2015-05-22 DIAGNOSIS — M25561 Pain in right knee: Secondary | ICD-10-CM | POA: Diagnosis not present

## 2015-05-22 DIAGNOSIS — M545 Low back pain: Secondary | ICD-10-CM | POA: Diagnosis not present

## 2015-05-22 DIAGNOSIS — M5416 Radiculopathy, lumbar region: Secondary | ICD-10-CM

## 2015-05-22 DIAGNOSIS — G8929 Other chronic pain: Secondary | ICD-10-CM | POA: Diagnosis not present

## 2015-05-22 MED ORDER — GABAPENTIN 400 MG PO CAPS: 400.0000 mg | ORAL_CAPSULE | Freq: Two times a day (BID) | ORAL | Status: DC

## 2015-05-22 NOTE — Progress Notes (Signed)
Left S1 transforaminal epidural steroid injection under fluoroscopic guidance  Indication: Lumbosacral radiculitis is not relieved by medication management or other conservative care and interfering with self-care and mobility.   Informed consent was obtained after describing risk and benefits of the procedure with the patient, this includes bleeding, bruising, infection, paralysis and medication side effects.  The patient wishes to proceed and has given written consent.  Patient was placed in prone position.  The lumbar area was marked and prepped with Betadine.  It was entered with a 25-gauge 1-1/2 inch needle and one mL of 1% lidocaine was injected into the skin and subcutaneous tissue.  Then a 22-gauge 3.5in spinal needle was inserted into the Left S1  sacral foramen under AP, lateral, and oblique view.  Then a solution containing one mL of 10 mg per mL dexamethasone and 2 mL of 1% lidocaine was injected.  The patient tolerated procedure well.  Post procedure instructions were given.  Please see post procedure form.

## 2015-05-22 NOTE — Progress Notes (Signed)
  PROCEDURE RECORD [facility]   Name: Rollie Hynek DOB:1966/04/04 MRN: [id number]  Date:05/22/2015  Physician: Alysia Penna, MD    Nurse/CMA: Mancel Parsons Allergies:  Allergies  Allergen Reactions  . Amoxicillin   . Food     Oranges or anything with citric acid    Consent Signed: Yes.    Is patient diabetic? Yes.    CBG today? 7.7  Pregnant: No. LMP: No LMP for male patient. (age 48-55)  Anticoagulants: no Anti-inflammatory: no Antibiotics: no  Procedure:s1 transforaminal ESI Position: Prone Start Time: 2:45 PM End Time: 2:50 pm  Fluoro Time: 14  RN/CMA Rolan Bucco [place]    Time 2:25 pm 2:55pm    BP 113/63 113/62    Pulse 75 73    Respirations 16 16    O2 Sat 99 99    S/S 6 6    Pain Level 7/10 7/10     D/C home with sister in law, patient A & O X 3, D/C instructions reviewed, and sits independently.

## 2015-05-25 ENCOUNTER — Telehealth: Payer: Self-pay | Admitting: Physical Medicine & Rehabilitation

## 2015-05-25 NOTE — Telephone Encounter (Signed)
Patient had low back pain after injection, but heat and ice helped it..  Also having some pain in his deltoid area did not know if this was normal after injection and the numbness in his legs and feet has subsided.  Would also like to know when he can start back exercising.  Please call patient at [phone].

## 2015-05-26 NOTE — Telephone Encounter (Signed)
May start walking for exercise this week. He can start with 15 minutes 3 times a day and work up to 20 minutes 3 times per day. Call us next week after he has done this for a week to let us know how he is doing and we can potentially advance from there

## 2015-05-26 NOTE — Telephone Encounter (Signed)
Please advise 

## 2015-05-27 NOTE — Telephone Encounter (Signed)
Left voicemail to return call ot office

## 2015-05-27 NOTE — Telephone Encounter (Signed)
Notified Glenn Robertson.  He was asking about bicycling but I told him to limit it to the walking for now and let us know next week.  He says that the numbness has come back in his foot. I will pass this along to  Dr Letta Pate,

## 2015-06-01 ENCOUNTER — Telehealth: Payer: Self-pay | Admitting: Physical Medicine & Rehabilitation

## 2015-06-01 NOTE — Telephone Encounter (Signed)
Patient called to let Dr. Letta Pate know that if he wants him to walk 15 min a day 3 times a day that is difficult.  He can do 2 times a day for 15 min.

## 2015-06-01 NOTE — Telephone Encounter (Signed)
Ok that is fine

## 2015-06-04 ENCOUNTER — Telehealth: Payer: Self-pay | Admitting: *Deleted

## 2015-06-05 ENCOUNTER — Ambulatory Visit: Payer: Medicare Other | Attending: Physical Medicine & Rehabilitation

## 2015-06-05 DIAGNOSIS — M5416 Radiculopathy, lumbar region: Secondary | ICD-10-CM | POA: Insufficient documentation

## 2015-06-05 DIAGNOSIS — M6588 Other synovitis and tenosynovitis, other site: Secondary | ICD-10-CM | POA: Insufficient documentation

## 2015-06-05 DIAGNOSIS — M6281 Muscle weakness (generalized): Secondary | ICD-10-CM | POA: Insufficient documentation

## 2015-06-05 DIAGNOSIS — M2560 Stiffness of unspecified joint, not elsewhere classified: Secondary | ICD-10-CM | POA: Insufficient documentation

## 2015-06-05 DIAGNOSIS — M7651 Patellar tendinitis, right knee: Secondary | ICD-10-CM | POA: Insufficient documentation

## 2015-06-10 NOTE — Telephone Encounter (Signed)
error 

## 2015-06-17 ENCOUNTER — Telehealth: Payer: Self-pay | Admitting: Physical Medicine & Rehabilitation

## 2015-06-17 NOTE — Telephone Encounter (Signed)
Glenn Robertson would like to know if it is okay now for him to ride his bicycle or not.  Please advise.

## 2015-06-22 ENCOUNTER — Ambulatory Visit: Payer: Medicare Other | Admitting: Physical Therapy

## 2015-06-22 NOTE — Telephone Encounter (Signed)
We will discuss that clinic visit tomorrow

## 2015-06-23 ENCOUNTER — Encounter: Payer: Medicare Other | Attending: Physical Medicine & Rehabilitation

## 2015-06-23 ENCOUNTER — Ambulatory Visit (HOSPITAL_BASED_OUTPATIENT_CLINIC_OR_DEPARTMENT_OTHER): Payer: Medicare Other | Admitting: Physical Medicine & Rehabilitation

## 2015-06-23 ENCOUNTER — Encounter: Payer: Self-pay | Admitting: Physical Medicine & Rehabilitation

## 2015-06-23 VITALS — BP 118/73 | HR 80 | Resp 16

## 2015-06-23 DIAGNOSIS — M545 Low back pain: Secondary | ICD-10-CM | POA: Diagnosis not present

## 2015-06-23 DIAGNOSIS — G8929 Other chronic pain: Secondary | ICD-10-CM | POA: Diagnosis not present

## 2015-06-23 DIAGNOSIS — M5416 Radiculopathy, lumbar region: Secondary | ICD-10-CM

## 2015-06-23 DIAGNOSIS — M25561 Pain in right knee: Secondary | ICD-10-CM | POA: Insufficient documentation

## 2015-06-23 MED ORDER — GABAPENTIN 300 MG PO CAPS: 600.0000 mg | ORAL_CAPSULE | Freq: Two times a day (BID) | ORAL | Status: DC

## 2015-06-23 NOTE — Progress Notes (Signed)
Subjective:    Patient ID: Glenn Robertson, male    DOB: 1965/12/05, 49 y.o.   MRN: [id number]  HPI 3 week relief with Left S1 injection, pain has returned Left buttocks, hamstring and foot Prior hx of Left S1 radic which required several Left S1 ESI Pain Inventory Average Pain 10 Pain Right Now 7 My pain is sharp and burning  In the last 24 hours, has pain interfered with the following? General activity 8 Relation with others 6 Enjoyment of life 10 What TIME of day is your pain at its worst? morning Sleep (in general) Good  Pain is worse with: na Pain improves with: na Relief from Meds: no relief  Mobility walk without assistance Do you have any goals in this area?  no  Function Do you have any goals in this area?  no  Neuro/Psych No problems in this area  Prior Studies Any changes since last visit?  no  Physicians involved in your care Any changes since last visit?  no   Family History  Problem Relation Age of Onset  . Diabetes Father   . Diabetes Paternal Uncle    Social History   Social History  . Marital Status: Single    Spouse Name: N/A  . Number of Children: N/A  . Years of Education: N/A   Social History Main Topics  . Smoking status: Never Smoker   . Smokeless tobacco: Never Used  . Alcohol Use: No  . Drug Use: No  . Sexual Activity: Not Asked   Other Topics Concern  . None   Social History Narrative   Past Surgical History  Procedure Laterality Date  . Arm surgery  1/12    rt ulnar nerve decompression  . Epidural block injection      several  . Ulnar nerve transposition  01/19/2012    Procedure: ULNAR NERVE DECOMPRESSION/TRANSPOSITION;  Surgeon: Cammie Sickle., MD;  Location: [facility] [facility];  Service: Orthopedics;  Laterality: Left;  ulnar nerve decompression vs transposition left elbow   Past Medical History  Diagnosis Date  . Diabetes mellitus   . Neuromuscular disorder ([place])   . Allergy   . Asthma   . Chronic  pain   . Depression    BP 118/73 mmHg  Pulse 80  Resp 16  SpO2 98%  Opioid Risk Score:   Fall Risk Score:  `1  Depression screen PHQ 2/9  Depression screen [facility] 2/9 04/21/2015 12/05/2014  Decreased Interest 2 2  Down, Depressed, Hopeless 3 3  PHQ - 2 Score 5 5  Altered sleeping - 0  Tired, decreased energy - 0  Change in appetite - 0  Feeling bad or failure about yourself  - 3  Trouble concentrating - 3  Moving slowly or fidgety/restless - 1  Suicidal thoughts - 1  PHQ-9 Score - 13     Review of Systems  All other systems reviewed and are negative.      Objective:   Physical Exam  Constitutional: He is oriented to person, place, and time. He appears well-developed and well-nourished.  HENT:  Head: Normocephalic and atraumatic.  Eyes: Conjunctivae and EOM are normal. Pupils are equal, round, and reactive to light.  Neurological: He is alert and oriented to person, place, and time. A sensory deficit is present.  Reflex Scores:      Patellar reflexes are 2+ on the right side and 2+ on the left side.      Achilles reflexes are 2+  on the right side and 1+ on the left side. Intact Bilateral L3,4,5 dermatomes  Psychiatric: He has a normal mood and affect.  Nursing note and vitals reviewed. mildly reduced Left S1 injection No plantar flexor weakness       Assessment & Plan:  1.  Left S1 radiculopathy has had three-week relief with left S1 transforaminal epidural steroid injection under fluoroscopic guidance. Recommend repeat injection. Was scheduled for today however did not have transportation. We'll reschedule as soon as possible. Will increase gabapentin to 600 mg twice a day  Okay to ride bike, Continue core stabilization exercises, extensor bias

## 2015-06-23 NOTE — Patient Instructions (Signed)

## 2015-06-25 ENCOUNTER — Encounter: Payer: Self-pay | Admitting: Physical Medicine & Rehabilitation

## 2015-06-25 ENCOUNTER — Ambulatory Visit (HOSPITAL_BASED_OUTPATIENT_CLINIC_OR_DEPARTMENT_OTHER): Payer: Medicare Other | Admitting: Physical Medicine & Rehabilitation

## 2015-06-25 VITALS — BP 119/64 | HR 83 | Resp 16

## 2015-06-25 DIAGNOSIS — M5416 Radiculopathy, lumbar region: Secondary | ICD-10-CM | POA: Diagnosis not present

## 2015-06-25 DIAGNOSIS — M25561 Pain in right knee: Secondary | ICD-10-CM | POA: Diagnosis not present

## 2015-06-25 DIAGNOSIS — M545 Low back pain: Secondary | ICD-10-CM | POA: Diagnosis present

## 2015-06-25 DIAGNOSIS — G8929 Other chronic pain: Secondary | ICD-10-CM | POA: Diagnosis not present

## 2015-06-25 NOTE — Progress Notes (Signed)
  PROCEDURE RECORD [facility]   Name: Glenn Robertson DOB:12-16-1965 MRN: [id number]  Date:06/25/2015  Physician: Alysia Penna, MD    Nurse/CMA: Mancel Parsons  Allergies:  Allergies  Allergen Reactions  . Amoxicillin   . Food     Oranges or anything with citric acid    Consent Signed: Yes.    Is patient diabetic? Yes.    CBG today? 110  Pregnant: No. LMP: No LMP for male patient. (age 48-55)  Anticoagulants: no Anti-inflammatory: no Antibiotics: no  Procedure: left S1 transforaminal epidural steroid injection  Position: Prone Start Time: 12:05 pm     End Time: 12:08pm Fluoro Time: 13  RN/CMA Rolan Bucco Bayan Kushnir    Time 11:48 am 12:10pm    BP 119/64 113/67    Pulse 82 87    Respirations 16 16    O2 Sat 96 99    S/S 6 6    Pain Level 7/10 5/10     D/C home with [name], patient A & O X 3, D/C instructions reviewed, and sits independently.

## 2015-06-25 NOTE — Patient Instructions (Signed)

## 2015-06-25 NOTE — Progress Notes (Signed)
Left S1 transforaminal epidural steroid injection under fluoroscopic guidance  Indication: Lumbosacral radiculitis is not relieved by medication management or other conservative care and interfering with self-care and mobility.   Informed consent was obtained after describing risk and benefits of the procedure with the patient, this includes bleeding, bruising, infection, paralysis and medication side effects.  The patient wishes to proceed and has given written consent.  Patient was placed in prone position.  The lumbar area was marked and prepped with Betadine.  It was entered with a 25-gauge 1-1/2 inch needle and one mL of 1% lidocaine was injected into the skin and subcutaneous tissue.  Then a 22-gauge 3.5in spinal needle was inserted into the Left S1  sacral foramen under AP, lateral, and oblique view.  Then a solution containing one mL of 10 mg per mL dexamethasone and 2 mL of 1% lidocaine was injected.  The patient tolerated procedure well.  Post procedure instructions were given.  Please see post procedure form.

## 2015-06-26 ENCOUNTER — Ambulatory Visit: Payer: Medicare Other | Admitting: Physical Medicine & Rehabilitation

## 2015-06-26 ENCOUNTER — Ambulatory Visit: Payer: Medicare Other | Admitting: Physical Therapy

## 2015-06-26 DIAGNOSIS — M6281 Muscle weakness (generalized): Secondary | ICD-10-CM

## 2015-06-26 DIAGNOSIS — M2560 Stiffness of unspecified joint, not elsewhere classified: Secondary | ICD-10-CM

## 2015-06-26 DIAGNOSIS — M7651 Patellar tendinitis, right knee: Secondary | ICD-10-CM | POA: Diagnosis present

## 2015-06-26 DIAGNOSIS — M5416 Radiculopathy, lumbar region: Secondary | ICD-10-CM

## 2015-06-26 DIAGNOSIS — M6588 Other synovitis and tenosynovitis, other site: Secondary | ICD-10-CM | POA: Diagnosis present

## 2015-06-26 NOTE — Therapy (Signed)
[place], [name], [address] Phone: [phone]   Fax:  [phone]  Physical Therapy Evaluation  Patient Details  Name: Glenn Robertson MRN: [id number] Date of Birth: Jan 12, 1966 Referring Provider: Dr. Alysia Penna  Encounter Date: 06/26/2015      PT End of Session - 06/26/15 1157    Visit Number 1   Number of Visits 16   Date for PT Re-Evaluation 08/21/15   Authorization Type Medicare G codes; 10th visit prog note; Kx visit 15   PT Start Time 0930   PT Stop Time 1015   PT Time Calculation (min) 45 min   Activity Tolerance Patient tolerated treatment well      Past Medical History  Diagnosis Date  . Diabetes mellitus   . Neuromuscular disorder ([place])   . Allergy   . Asthma   . Chronic pain   . Depression     Past Surgical History  Procedure Laterality Date  . Arm surgery  1/12    rt ulnar nerve decompression  . Epidural block injection      several  . Ulnar nerve transposition  01/19/2012    Procedure: ULNAR NERVE DECOMPRESSION/TRANSPOSITION;  Surgeon: Cammie Sickle., MD;  Location: [facility];  Service: Orthopedics;  Laterality: Left;  ulnar nerve decompression vs transposition left elbow    There were no vitals filed for this visit.  Visit Diagnosis:  Lumbar radiculitis - Plan: PT plan of care cert/re-cert  Joint stiffness of spine - Plan: PT plan of care cert/re-cert  Muscle weakness - Plan: PT plan of care cert/re-cert      Subjective Assessment - 06/26/15 0937    Subjective 2012 May woke up with pain in posterior thigh, calf to foot no apparent reason.  Saw the doctor and was referred to PT,  the pain dissipated.  Lateral left foot numbness constant and worse over the last 4 years.  No buckling or giveway.  Has been lifting weights, training for a 5 K and cycling.  Took a 10 day bus tour of [place] this which aggravated  symptoms.  Had ESI 4 weeks ago but then  1 week ago pain returned.  Had ESI yesterday, had previous one and cortisone last year.     Pertinent History right > left  knee OA, left ankle sensitivity; right toe numbness;  2 elbow surgeries and ankle surgery   Limitations Sitting;Walking;House hold activities   How long can you sit comfortably? 1 hour   How long can you walk comfortably? 20 min   Diagnostic tests MRI ; NCV   Patient Stated Goals "therapy is a weight station to surgery"   Currently in Pain? Yes   Pain Score 6    Pain Location Buttocks   Pain Orientation Left   Pain Type Chronic pain   Pain Frequency Constant   Aggravating Factors  AM;  driving too far, standing in one place   Pain Relieving Factors nothing helps;              [facility] PT Assessment - 06/26/15 0951    Assessment   Medical Diagnosis left lumbar radiculitis   Referring Provider Dr. Alysia Penna   Onset Date/Surgical Date 05/22/15  original 2013   Hand Dominance Right   Next MD Visit 07/27/15   Prior Therapy PT for back 4 years ago, knee PT last year   Precautions   Precautions None   Restrictions   Weight Bearing Restrictions  No   Balance Screen   Has the patient fallen in the past 6 months Yes   How many times? 1  slipped in some [place] residence   Prior Function   Level of Independence Independent   Vocation On disability   Leisure travel, read, run, bike, photography   Observation/Other Assessments   Focus on Therapeutic Outcomes (FOTO)  not captured by staff   Other Surveys  --  do Oswestry next visit   Posture/Postural Control   Posture Comments wears B orthotics   AROM   AROM Assessment Site Hip;Lumbar   Right/Left Hip Left   Left Hip ABduction -4   Right/Left Knee Left   Right Knee Extension 4   Right Knee Flexion 5   Left Knee Extension 4   Left Knee Flexion 5   Lumbar Flexion 60   Lumbar Extension 20   Lumbar - Right Side Bend 35   Lumbar - Left Side Bend 35    Strength   Strength Assessment Site Lumbar   Lumbar Flexion 4/5   Lumbar Extension 4/5   FABER test   findings Not tested   Slump test   Findings Positive   Side Left   Prone Knee Bend Test   Findings Negative   Side Left   Straight Leg Raise   Findings Positive   Side  Left   Ambulation/Gait   Gait Comments lateral pelvic drop on left                            PT Education - 06/26/15 1152    Education provided Yes   Education Details supine neural glide on/off 10x   Person(s) Educated Patient   Methods Explanation;Demonstration   Comprehension Verbalized understanding          PT Short Term Goals - 06/26/15 1218    PT SHORT TERM GOAL #1   Title Patient will be able to perform /resume low to moderate level neural mobliity and core strengthening without exacerbation of pain.     Time 4   Period Weeks   Status New   PT SHORT TERM GOAL #2   Title Patient will have a 25% improvement in pain and function.   Time 4   Period Weeks   Status New           PT Long Term Goals - 06/26/15 1221    PT LONG TERM GOAL #1   Title Independent with safe self progression of HEP for further ROM and strengthening   Time 8   Period Weeks   Status New   PT LONG TERM GOAL #2   Title Left hip abduction strength  improved to 4/5 needed for prolonged standing and walking   Time 8   Period Weeks   Status New   PT LONG TERM GOAL #3   Title Core muscle strength improved to 4+/5 needed for prolonged walking and standing tolerance   Time 8   Period Weeks   Status New   PT LONG TERM GOAL #4   Title Oswestry Index improved to minimal self perceived disabilty 20% or less   Time 8   Period Weeks   Status New   PT LONG TERM GOAL #5   Title Lumbar AROM flexion and extension improved to 70 degrees and 30 degrees needed for greater mobility with transitional movements   Time 8  Period Weeks   Status New               Plan - 06/29/15 1158    Clinical  Impression Statement The patient is a 49 year old who woke up with left posterior thigh pain, calf pain and foot numbness for no apparent reason in 2012.  He has had progressively worsening of foot numbness over time.  He only has LBP with sitting too long but LE symptoms are constant.  He has worsening pain with sitting too long and prolonged standing.  He has tried to remain active by [employer] and doing a few [employer] but nothing in the last few weeks.  He is hightly motivated to exercise stating, "work me out."  He did just have his second ESI yesterday so no repetitive or vigorous movement performed today.  First ESI only gave a few weeks relief.  Left LE strength grossly 5/5 except hip abd 4-/5.  Decreased core strenth 4/5.  Decreased Lumbar AROM:  flex 60, ext 20, right and left sidebending 33, 35 degrees.  Pelvic hip drop on left with fast pace ambulation.  Tenderness gluteals, mildly in HS and gastroc.  Postive slump test and SLR.  Patient would benefit from PT to address these deficits.   Pt will benefit from skilled therapeutic intervention in order to improve on the following deficits Pain;Impaired flexibility;Decreased strength;Difficulty walking;Decreased activity tolerance;Decreased range of motion   Rehab Potential Good   Clinical Impairments Affecting Rehab Potential B knee OA   PT Frequency 2x / week   PT Duration 8 weeks   PT Treatment/Interventions ADLs/Self Care Home Management;Electrical Stimulation;Cryotherapy;Moist Heat;Therapeutic exercise;Patient/family education;Manual techniques;Taping;Dry needling   PT Next Visit Plan Do Oswestry;  check supine neural glide HEP; manual small components of neural gliding;  hip/pelvic mobs;  ab brace series; lumbar multifidi series; left gluteal strengthening          G-Codes - 06/29/2015 1226    Functional Assessment Tool Used clinical judgement   Functional Limitation Mobility: Walking and moving around   Mobility:  Walking and Moving Around Current Status [phone]) At least 40 percent but less than 60 percent impaired, limited or restricted   Mobility: Walking and Moving Around Goal Status [phone]) At least 20 percent but less than 40 percent impaired, limited or restricted       Problem List Patient Active Problem List   Diagnosis Date Noted  . Left lumbar radiculitis 04/21/2015  . HNP (herniated nucleus pulposus), lumbar 04/21/2015  . Chondromalacia of both patellae 02/24/2015  . Paranoid schizophrenia, chronic condition ([place]) 02/24/2015  . Chronic low back pain 07/18/2014  . Knee pain, acute 07/18/2014  . Right ankle pain 01/27/2014  . Great toe pain 01/27/2014  . Sciatica 01/10/2012  . Thoracic or lumbosacral neuritis or radiculitis, unspecified 12/15/2011  . Ulnar neuropathy at elbow 11/14/2011  . Disturbance of skin sensation 11/14/2011    Alvera Singh 06/29/15, 12:28 PM  [facility] [address] [place], [name], [address] Phone: [phone]   Fax:  [phone]  Name: Maylon Sailors MRN: [id number] Date of Birth: 11/11/1965   Ruben Im, PT 29-Jun-2015 12:29 PM Phone: [phone] Fax: [phone]

## 2015-06-26 NOTE — Patient Instructions (Signed)
Supine left leg neural glide:  Knee flex/extension on/off 10x 1x/day

## 2015-06-29 ENCOUNTER — Ambulatory Visit: Payer: Medicare Other | Admitting: Physical Therapy

## 2015-06-29 DIAGNOSIS — M2560 Stiffness of unspecified joint, not elsewhere classified: Secondary | ICD-10-CM

## 2015-06-29 DIAGNOSIS — M5416 Radiculopathy, lumbar region: Secondary | ICD-10-CM | POA: Diagnosis not present

## 2015-06-29 DIAGNOSIS — M7651 Patellar tendinitis, right knee: Secondary | ICD-10-CM

## 2015-06-29 DIAGNOSIS — M6281 Muscle weakness (generalized): Secondary | ICD-10-CM

## 2015-06-29 DIAGNOSIS — M659 Synovitis and tenosynovitis, unspecified: Secondary | ICD-10-CM

## 2015-06-29 NOTE — Patient Instructions (Addendum)
Hip Abductor With Hip Flexed 90 Degrees    Wrap strap around left leg, inside calf, outside thigh, hold in opposite-side hand. Pull leg up until stretch felt in hamstring and then, pull leg over to other side. . Hold _30__ seconds. Relax leg. Repeat 3 x each side Repeat ___ times.  Copyright  VHI. All rights reserved.  Hamstring Stretch   CAN USE SHEET TO PULL LEG UP  With other leg bent, foot flat, grasp right leg and slowly try to straighten knee. Hold _30___ seconds. FLEX AND STRAIGHTEN FOOT X 10  Repeat _3___ times each leg. Do ___2_ sessions per day.  [email or web address].[email or web address]   Isometric Hold (Quadruped)   On hands and knees, slowly inhale, and then exhale. Pull navel toward spine and Hold for 5___ seconds. Continue to breathe in and out during hold.    Copyright  VHI. All rights reserved.  Bracing With Arm Raise (Quadruped)   On hands and knees find neutral spine. Tighten pelvic floor and abdominals and hold. Alternately lift arm to shoulder level. Repeat __10_ times. Do _2__ times a day.   Copyright  VHI. All rights reserved.  Bracing With Leg Raise (Quadruped)   On hands and knees find neutral spine. Tighten pelvic floor and abdominals and hold. Alternating legs, straighten and lift to hip level. Repeat _10__ times. Do _2__ times a day.   Copyright  VHI. All rights reserved.  Bracing With Arm / Leg Raise (Quadruped)   On hands and knees find neutral spine. Tighten pelvic floor and abdominals and hold. Alternating, lift arm to shoulder level and opposite leg to hip level. Repeat _10__ times. Do _2__ times a day.   Copyright  VHI. All rights reserved.  Prone Multifidus Exercises x 10 each

## 2015-06-29 NOTE — Therapy (Signed)
[place], [name], [address] Phone: [phone]   Fax:  [phone]  Physical Therapy Treatment  Patient Details  Name: Glenn Robertson MRN: [id number] Date of Birth: 1966/05/07 Referring Provider: Dr. Alysia Penna  Encounter Date: 06/29/2015      PT End of Session - 06/29/15 1527    Visit Number 2   Number of Visits 16   Date for PT Re-Evaluation 08/21/15   Authorization Type Medicare G codes; 10th visit prog note; Kx visit 15   PT Start Time 0128   PT Stop Time 0215   PT Time Calculation (min) 47 min      Past Medical History  Diagnosis Date  . Diabetes mellitus   . Neuromuscular disorder ([place])   . Allergy   . Asthma   . Chronic pain   . Depression     Past Surgical History  Procedure Laterality Date  . Arm surgery  1/12    rt ulnar nerve decompression  . Epidural block injection      several  . Ulnar nerve transposition  01/19/2012    Procedure: ULNAR NERVE DECOMPRESSION/TRANSPOSITION;  Surgeon: Cammie Sickle., MD;  Location: [facility];  Service: Orthopedics;  Laterality: Left;  ulnar nerve decompression vs transposition left elbow    There were no vitals filed for this visit.  Visit Diagnosis:  Lumbar radiculitis  Joint stiffness of spine  Muscle weakness  Patellar tendonitis of right knee  Tenosynovitis, wrist      Subjective Assessment - 06/29/15 1333    Subjective Overall the same, but a little better today.    Currently in Pain? Yes   Pain Score 3    Pain Location Back   Pain Orientation Left   Pain Radiating Towards left hip, thigh, calf                         [facility] Adult PT Treatment/Exercise - 06/29/15 1335    Lumbar Exercises: Stretches   Active Hamstring Stretch 3 reps;30 seconds   Active Hamstring Stretch Limitations bilateral with neural glide x10 x 3 each leg   Lumbar Exercises: Aerobic   Stationary Bike Nustep L4  x 5 min   Lumbar Exercises: Prone   Other Prone Lumbar Exercises plank on elbows x 30 sec - decreased motor control   Other Prone Lumbar Exercises Multifidus activation x 10 -good contraction, then with hamstring curls, extensions and donkey kicks x 10 each bilateral with tactile cues throughout.    Lumbar Exercises: Quadruped   Madcat/Old Horse 10 reps   Single Arm Raise 10 reps   Single Arm Raises Limitations with neutral ab brace and tactile cues for neutral spine    Straight Leg Raise 10 reps   Straight Leg Raises Limitations with neutral ab set   Opposite Arm/Leg Raise 10 reps                PT Education - 06/29/15 1421    Education provided Yes   Education Details Prone multifidus press series, Quadruped series, Hamstring and ITB stretch   Person(s) Educated Patient   Methods Explanation;Handout   Comprehension Verbalized understanding          PT Short Term Goals - 06/26/15 1218    PT SHORT TERM GOAL #1   Title Patient will be able to perform /resume low to moderate level neural mobliity and core strengthening without exacerbation of pain.  Time 4   Period Weeks   Status New   PT SHORT TERM GOAL #2   Title Patient will have a 25% improvement in pain and function.   Time 4   Period Weeks   Status New           PT Long Term Goals - 06/26/15 1221    PT LONG TERM GOAL #1   Title Independent with safe self progression of HEP for further ROM and strengthening   Time 8   Period Weeks   Status New   PT LONG TERM GOAL #2   Title Left hip abduction strength  improved to 4/5 needed for prolonged standing and walking   Time 8   Period Weeks   Status New   PT LONG TERM GOAL #3   Title Core muscle strength improved to 4+/5 needed for prolonged walking and standing tolerance   Time 8   Period Weeks   Status New   PT LONG TERM GOAL #4   Title Oswestry Index improved to minimal self perceived disabilty 20% or less   Time 8   Period Weeks   Status New    PT LONG TERM GOAL #5   Title Lumbar AROM flexion and extension improved to 70 degrees and 30 degrees needed for greater mobility with transitional movements   Time 8   Period Weeks   Status New               Plan - 06/29/15 1509    Clinical Impression Statement Instructed pt in prone multifidus press series with good contraction of multifidus. Pt very receptive to exercises and would like comprehensive HEP. Included Quadruped and prone series as well as hamstring and ITB stretching. No increased pain at end of session   PT Next Visit Plan Do oswestry, check HEP, piriformis stretch        Problem List Patient Active Problem List   Diagnosis Date Noted  . Left lumbar radiculitis 04/21/2015  . HNP (herniated nucleus pulposus), lumbar 04/21/2015  . Chondromalacia of both patellae 02/24/2015  . Paranoid schizophrenia, chronic condition ([place]) 02/24/2015  . Chronic low back pain 07/18/2014  . Knee pain, acute 07/18/2014  . Right ankle pain 01/27/2014  . Great toe pain 01/27/2014  . Sciatica 01/10/2012  . Thoracic or lumbosacral neuritis or radiculitis, unspecified 12/15/2011  . Ulnar neuropathy at elbow 11/14/2011  . Disturbance of skin sensation 11/14/2011    Dorene Ar, PTA 06/29/2015, 3:28 PM  [facility] [address] [place], [name], [address] Phone: [phone]   Fax:  [phone]  Name: Glenn Robertson MRN: [id number] Date of Birth: 07/16/1966

## 2015-07-02 ENCOUNTER — Telehealth: Payer: Self-pay | Admitting: Physical Medicine & Rehabilitation

## 2015-07-02 NOTE — Telephone Encounter (Signed)
I recommend one more Left S1 epidural injection  In 1 month and this may help pain  Go away for a longer period of time Please schedule this procedure and notify patient

## 2015-07-02 NOTE — Telephone Encounter (Signed)
Please advise 

## 2015-07-02 NOTE — Telephone Encounter (Signed)
Patient was in office last week for injection, still having pain, but not as bad would like to know when is the pain going to go away.  It is effecting his everyday activities.  Please call him at [phone].

## 2015-07-02 NOTE — Telephone Encounter (Signed)
Please schedule patient for injection.

## 2015-07-03 NOTE — Telephone Encounter (Signed)
SPOKE TO PATIENT - INFORMED HIM THAT THE DOCTOR FEELS THAT HAVING A SECONDARY PROCEDURE AT TEH END OF THE MONTH WILL HELP WITH HIS PAIN - PT WANTS TO KNOW IF HE WILL HAVE TO COME MONTHLY FOR THESE PROCEDURES ALONG WITH OTHER QUESTIONS - PLEASE CALL PT AND ADVISE

## 2015-07-03 NOTE — Telephone Encounter (Signed)
PATIENT ALREADY HAS AN UPCOMING APPT FOR THE END OF November FOR ANOTHER PROCEDURE - AM I SUPPOSED TO CALL THE PT OR LEAVE IT BE - THERE ARE NO SOONER APPTS CURRENTLY

## 2015-07-03 NOTE — Telephone Encounter (Signed)
Call the patient and let her know that Kirsteins feels her procedure scheduled for November will help the pin go away longer.

## 2015-07-08 ENCOUNTER — Ambulatory Visit: Payer: Medicare Other | Attending: Physical Medicine & Rehabilitation | Admitting: Physical Therapy

## 2015-07-08 DIAGNOSIS — M6281 Muscle weakness (generalized): Secondary | ICD-10-CM | POA: Diagnosis present

## 2015-07-08 DIAGNOSIS — M6588 Other synovitis and tenosynovitis, other site: Secondary | ICD-10-CM | POA: Insufficient documentation

## 2015-07-08 DIAGNOSIS — M5416 Radiculopathy, lumbar region: Secondary | ICD-10-CM | POA: Diagnosis present

## 2015-07-08 DIAGNOSIS — M7651 Patellar tendinitis, right knee: Secondary | ICD-10-CM

## 2015-07-08 DIAGNOSIS — M2560 Stiffness of unspecified joint, not elsewhere classified: Secondary | ICD-10-CM | POA: Diagnosis present

## 2015-07-08 DIAGNOSIS — M659 Synovitis and tenosynovitis, unspecified: Secondary | ICD-10-CM

## 2015-07-08 NOTE — Therapy (Signed)
[place], [name], [address] Phone: [phone]   Fax:  [phone]  Physical Therapy Treatment  Patient Details  Name: Glenn Robertson MRN: [id number] Date of Birth: November 22, 1965 Referring Provider: Dr. Alysia Penna  Encounter Date: 07/08/2015      PT End of Session - 07/08/15 1158    Visit Number 3   Number of Visits 16   Date for PT Re-Evaluation 08/21/15   Authorization Type Medicare G codes; 10th visit prog note; Kx visit 15   PT Start Time 1100   PT Stop Time 1200   PT Time Calculation (min) 60 min      Past Medical History  Diagnosis Date  . Diabetes mellitus   . Neuromuscular disorder ([place])   . Allergy   . Asthma   . Chronic pain   . Depression     Past Surgical History  Procedure Laterality Date  . Arm surgery  1/12    rt ulnar nerve decompression  . Epidural block injection      several  . Ulnar nerve transposition  01/19/2012    Procedure: ULNAR NERVE DECOMPRESSION/TRANSPOSITION;  Surgeon: Cammie Sickle., MD;  Location: [facility];  Service: Orthopedics;  Laterality: Left;  ulnar nerve decompression vs transposition left elbow    There were no vitals filed for this visit.  Visit Diagnosis:  Lumbar radiculitis  Joint stiffness of spine  Muscle weakness  Patellar tendonitis of right knee  Tenosynovitis, wrist      Subjective Assessment - 07/08/15 1107    Subjective I am sore in my low back.  I think the shot helped. I no longer have left leg pain.    Pain Score 3    Pain Location Back   Pain Orientation Mid   Pain Type Chronic pain            [facility] PT Assessment - 07/08/15 1212    Observation/Other Assessments   Other Surveys  Other Surveys   Oswestry Disability Index  6%   due to injection alleviating leg pain                     [facility] Adult PT Treatment/Exercise - 07/08/15 1141    Lumbar Exercises: Stretches   Active  Hamstring Stretch 3 reps;30 seconds   ITB Stretch 3 reps;30 seconds   ITB Stretch Limitations with sheet, cross over, then adductor stretch with strap 3 x 30   Lumbar Exercises: Aerobic   Stationary Bike Nustep L5 x 5 min LE only   Lumbar Exercises: Standing   Other Standing Lumbar Exercises Half kneeling with trunk rotations bilateral, then advanced to tandem half kneeling with trunk rotations. unable to perform with Left knee down due to poor motor control and LOB. He was able to do head turns and eyes closed while holding wooden dowel to stabliize.    Lumbar Exercises: Supine   Bridge 5 reps   Bridge Limitations poor motor control-shaky, tried knees over ball x 10, c/o right adductor pain.    Straight Leg Raise 10 reps   Straight Leg Raises Limitations with ab set   Other Supine Lumbar Exercises mini crunches with poor motor control    Lumbar Exercises: Quadruped   Madcat/Old Horse 10 reps   Opposite Arm/Leg Raise 10 reps   Opposite Arm/Leg Raise Limitations more cues needed for pelvic alignment during left LE weight bearing. poor motor control   Modalities   Modalities  Cryotherapy   Cryotherapy   Number Minutes Cryotherapy 15 Minutes   Cryotherapy Location Lumbar Spine   Type of Cryotherapy Ice pack                PT Education - 07/08/15 1200    Education provided Yes   Education Details half kneeling with trunk rotations and abdominal crunches   Person(s) Educated Patient   Methods Explanation   Comprehension Verbalized understanding;Returned demonstration          PT Short Term Goals - 07/08/15 1128    PT SHORT TERM GOAL #1   Title Patient will be able to perform /resume low to moderate level neural mobliity and core strengthening without exacerbation of pain.     Time 4   Period Weeks   Status On-going   PT SHORT TERM GOAL #2   Title Patient will have a 25% improvement in pain and function.   Baseline 90% decrease in hip and leg pain   Time 4   Period  Weeks   Status Achieved           PT Long Term Goals - 07/08/15 1221    PT LONG TERM GOAL #1   Title Independent with safe self progression of HEP for further ROM and strengthening   Time 8   Period Weeks   Status On-going   PT LONG TERM GOAL #2   Title Left hip abduction strength  improved to 4/5 needed for prolonged standing and walking   Time 8   Period Weeks   Status Unable to assess   PT LONG TERM GOAL #3   Title Core muscle strength improved to 4+/5 needed for prolonged walking and standing tolerance   Time 8   Period Weeks   Status On-going   PT LONG TERM GOAL #4   Title Oswestry Index improved to minimal self perceived disabilty 20% or less   Time 8   Period Weeks   Status Achieved   PT LONG TERM GOAL #5   Title Lumbar AROM flexion and extension improved to 70 degrees and 30 degrees needed for greater mobility with transitional movements   Time 8   Period Weeks   Status Unable to assess               Plan - 07/08/15 1137    Clinical Impression Statement Poor motor control with half kneeling exercises L>R. This also carried over into poor motor control during supine exercises and stretches. Pt reports 90% resolved leg pain since injection. He notes a mild increase in pain along sacrum since beginning PT exercises. He rates at 3/10. Reviewd pt's HEP and corrected as needed to ensure neutral spine. He domstrates decreased  left> right hip stability    PT Next Visit Plan Check half kneeling and abdominal crunches, add piridformis stretch        Problem List Patient Active Problem List   Diagnosis Date Noted  . Left lumbar radiculitis 04/21/2015  . HNP (herniated nucleus pulposus), lumbar 04/21/2015  . Chondromalacia of both patellae 02/24/2015  . Paranoid schizophrenia, chronic condition ([place]) 02/24/2015  . Chronic low back pain 07/18/2014  . Knee pain, acute 07/18/2014  . Right ankle pain 01/27/2014  . Great toe pain 01/27/2014  . Sciatica  01/10/2012  . Thoracic or lumbosacral neuritis or radiculitis, unspecified 12/15/2011  . Ulnar neuropathy at elbow 11/14/2011  . Disturbance of skin sensation 11/14/2011    Dorene Ar, PTA 07/08/2015, 12:22 PM  [facility] [address] [place], [name], [address] Phone: [phone]   Fax:  [phone]  Name: Glenn Robertson MRN: [id number] Date of Birth: [birth year]

## 2015-07-09 ENCOUNTER — Telehealth: Payer: Self-pay | Admitting: Physical Therapy

## 2015-07-09 NOTE — Telephone Encounter (Signed)
Returned [name] Call regarding pain this morning. He reports waking up with 6-7/10 left leg pain this morning and would like to know if he should discontinue his HEP. I advised him to discontinue his new HEP and to continue stretches and ice until next visit.

## 2015-07-13 ENCOUNTER — Ambulatory Visit (HOSPITAL_BASED_OUTPATIENT_CLINIC_OR_DEPARTMENT_OTHER): Payer: Medicare Other | Admitting: Physical Medicine & Rehabilitation

## 2015-07-13 ENCOUNTER — Encounter: Payer: Self-pay | Admitting: Physical Medicine & Rehabilitation

## 2015-07-13 ENCOUNTER — Encounter: Payer: Medicare Other | Attending: Physical Medicine & Rehabilitation

## 2015-07-13 VITALS — BP 108/64 | HR 85

## 2015-07-13 DIAGNOSIS — M5416 Radiculopathy, lumbar region: Secondary | ICD-10-CM

## 2015-07-13 DIAGNOSIS — M25561 Pain in right knee: Secondary | ICD-10-CM | POA: Insufficient documentation

## 2015-07-13 DIAGNOSIS — M5126 Other intervertebral disc displacement, lumbar region: Secondary | ICD-10-CM | POA: Diagnosis not present

## 2015-07-13 DIAGNOSIS — M545 Low back pain: Secondary | ICD-10-CM | POA: Insufficient documentation

## 2015-07-13 DIAGNOSIS — G8929 Other chronic pain: Secondary | ICD-10-CM | POA: Insufficient documentation

## 2015-07-13 NOTE — Patient Instructions (Signed)
May do home exercises rather than PT

## 2015-07-13 NOTE — Progress Notes (Signed)
Subjective:    Patient ID: Glenn Robertson, male    DOB: 1965-12-20, 49 y.o.   MRN: [id number]  HPI  Chief complaint is left lower extremity pain discussed from the buttocks to the left little toe S1 epidural injections performed 05/22/2015 which lasted about 2 weeks S1 epidural injection repeated 06/25/2015 started working approximately one week ago.  Physical therapy seems to be aggravating the pain. Twisting activity seemed to aggravate the pain the most.   Pain Inventory Average Pain 0 Pain Right Now 0 My pain is no pain  In the last 24 hours, has pain interfered with the following? General activity 0 Relation with others 0 Enjoyment of life 1 What TIME of day is your pain at its worst? morning Sleep (in general) Good  Pain is worse with: sitting and standing Pain improves with: rest, medication and injections Relief from Meds: 0  Mobility Do you have any goals in this area?  no  Function Do you have any goals in this area?  no  Neuro/Psych No problems in this area  Prior Studies Any changes since last visit?  no  Physicians involved in your care Any changes since last visit?  no   Family History  Problem Relation Age of Onset  . Diabetes Father   . Diabetes Paternal Uncle    Social History   Social History  . Marital Status: Single    Spouse Name: N/A  . Number of Children: N/A  . Years of Education: N/A   Social History Main Topics  . Smoking status: Never Smoker   . Smokeless tobacco: Never Used  . Alcohol Use: No  . Drug Use: No  . Sexual Activity: Not Asked   Other Topics Concern  . None   Social History Narrative   Past Surgical History  Procedure Laterality Date  . Arm surgery  1/12    rt ulnar nerve decompression  . Epidural block injection      several  . Ulnar nerve transposition  01/19/2012    Procedure: ULNAR NERVE DECOMPRESSION/TRANSPOSITION;  Surgeon: Cammie Sickle., MD;  Location: [place];  Service:  Orthopedics;  Laterality: Left;  ulnar nerve decompression vs transposition left elbow   Past Medical History  Diagnosis Date  . Diabetes mellitus   . Neuromuscular disorder ([place])   . Allergy   . Asthma   . Chronic pain   . Depression    BP 108/64 mmHg  Pulse 85  SpO2 97%  Opioid Risk Score:   Fall Risk Score:  `1  Depression screen PHQ 2/9  Depression screen [facility] 2/9 04/21/2015 12/05/2014  Decreased Interest 2 2  Down, Depressed, Hopeless 3 3  PHQ - 2 Score 5 5  Altered sleeping - 0  Tired, decreased energy - 0  Change in appetite - 0  Feeling bad or failure about yourself  - 3  Trouble concentrating - 3  Moving slowly or fidgety/restless - 1  Suicidal thoughts - 1  PHQ-9 Score - 13     Review of Systems  All other systems reviewed and are negative.      Objective:   Physical Exam  Constitutional: He is oriented to person, place, and time. He appears well-developed and well-nourished.  HENT:  Head: Normocephalic and atraumatic.  Eyes: Conjunctivae and EOM are normal. Pupils are equal, round, and reactive to light.  Neck: Normal range of motion.  Musculoskeletal:       Lumbar back: He exhibits decreased range  of motion.  Neurological: He is alert and oriented to person, place, and time.  Psychiatric: He has a normal mood and affect.  Nursing note and vitals reviewed.  Motor strength is 5/5 bilateral hip flexor and knee extensor and ankle dorsiflexor and plantar flexor Lumbar range of motion is most painful with twisting. He has good range with flexion and extension as well as with lateral bending. Remedies without edema        Assessment & Plan:  1.  Left S1 Radiculopathy- discussed natural hx of radiculopathy, There may be some spontaneous improvement over time. We discussed that surgery may hasten his improvement in regards to his left lower extremity discomfort however may not do a whole lot for his low back pain given his disc degeneration at that  level. Would like to check EMG/NCV of the left lower extremity to look if there is any objective evidence of nerve root damage. Patient is trying to avoid surgery if possible. We'll continue gabapentin in the meantime  Patient had a list of questions that we discussed including natural time course of recovery, benefits of surgery, however he will be 10 years down the road versus one year down the road. Over half of the 25 min visit was spent counseling and coordinating care.  May quit physical therapy given that it is aggravating his pain. Have given him some home exercises that he can do instead these are basically extension exercises both in the standing position as well as in the prone lying position.

## 2015-07-15 ENCOUNTER — Ambulatory Visit: Payer: Medicare Other | Admitting: Physical Therapy

## 2015-07-16 ENCOUNTER — Other Ambulatory Visit: Payer: Self-pay | Admitting: Orthopedic Surgery

## 2015-07-16 DIAGNOSIS — M25561 Pain in right knee: Secondary | ICD-10-CM

## 2015-07-17 ENCOUNTER — Ambulatory Visit: Payer: Medicare Other | Admitting: Physical Therapy

## 2015-07-17 DIAGNOSIS — M5416 Radiculopathy, lumbar region: Secondary | ICD-10-CM

## 2015-07-17 DIAGNOSIS — M2560 Stiffness of unspecified joint, not elsewhere classified: Secondary | ICD-10-CM

## 2015-07-17 DIAGNOSIS — M6281 Muscle weakness (generalized): Secondary | ICD-10-CM

## 2015-07-17 NOTE — Therapy (Signed)
[name], [name], [address] Phone: [phone]   Fax:  [phone]  Physical Therapy Treatment  Patient Details  Name: Glenn Robertson MRN: [id number] Date of Birth: 1966-06-29 Referring Provider: Dr. Alysia Penna  Encounter Date: 07/17/2015      PT End of Session - 07/17/15 0823    Visit Number 4   Number of Visits 16   Date for PT Re-Evaluation 08/21/15   Authorization Type Medicare G codes; 10th visit prog note; Kx visit 15   PT Start Time 0800   PT Stop Time 0845   PT Time Calculation (min) 45 min   Activity Tolerance Patient tolerated treatment well      Past Medical History  Diagnosis Date  . Diabetes mellitus   . Neuromuscular disorder ([place])   . Allergy   . Asthma   . Chronic pain   . Depression     Past Surgical History  Procedure Laterality Date  . Arm surgery  1/12    rt ulnar nerve decompression  . Epidural block injection      several  . Ulnar nerve transposition  01/19/2012    Procedure: ULNAR NERVE DECOMPRESSION/TRANSPOSITION;  Surgeon: Cammie Sickle., MD;  Location: [place];  Service: Orthopedics;  Laterality: Left;  ulnar nerve decompression vs transposition left elbow    There were no vitals filed for this visit.  Visit Diagnosis:  Lumbar radiculitis  Joint stiffness of spine  Muscle weakness      Subjective Assessment - 07/17/15 0802    Subjective Reports 1/2 kneel and rotating torso aggravated his pain.  Back pain with getting in/out of the car.  No back, hip or leg pain.  That shot is helping.  The doctor said I need to work on my abdominals.  Sees the surgeon next month.     Currently in Pain? No/denies   Pain Score 0-No pain   Pain Location Back   Aggravating Factors  getting in/out of the car                         [facility] Adult PT Treatment/Exercise - 07/17/15 0806    Lumbar Exercises: Aerobic   Stationary Bike  NU-Step L4 UE/LE 6 min   Lumbar Exercises: Supine   Ab Set 10 reps   Bent Knee Raise 10 reps   Isometric Hip Flexion 10 reps   Lumbar Exercises: Prone   Other Prone Lumbar Exercises plank on elbows and knees 5x 10 sec - decreased motor control   Other Prone Lumbar Exercises Multifidus activation x 10 -good contraction, then with hamstring curls, extensions and donkey kicks x 10 each bilateral with tactile cues throughout.   head lift with Is and Ts 5x each   Lumbar Exercises: Quadruped   Opposite Arm/Leg Raise 10 reps                PT Education - 07/17/15 0841    Education provided Yes   Education Details ab series, multifidi series, quadruped UE/LE    Person(s) Educated Patient   Methods Explanation;Demonstration;Handout   Comprehension Verbalized understanding;Returned demonstration          PT Short Term Goals - 07/17/15 0901    PT SHORT TERM GOAL #1   Title Patient will be able to perform /resume low to moderate level neural mobliity and core strengthening without exacerbation of pain.     Time 4  Period Weeks   Status On-going   PT SHORT TERM GOAL #2   Title Patient will have a 25% improvement in pain and function.   Time 4   Period Weeks   Status Achieved           PT Long Term Goals - 07/17/15 0901    PT LONG TERM GOAL #1   Title Independent with safe self progression of HEP for further ROM and strengthening   Time 8   Period Weeks   Status On-going   PT LONG TERM GOAL #2   Title Left hip abduction strength  improved to 4/5 needed for prolonged standing and walking   Time 8   Period Weeks   Status On-going   PT LONG TERM GOAL #3   Title Core muscle strength improved to 4+/5 needed for prolonged walking and standing tolerance   Time 8   Period Weeks   Status On-going   PT LONG TERM GOAL #4   Title Oswestry Index improved to minimal self perceived disabilty 20% or less   Status Achieved   PT LONG TERM GOAL #5   Title Lumbar AROM flexion and  extension improved to 70 degrees and 30 degrees needed for greater mobility with transitional movements   Time 8   Period Weeks               Plan - 07/17/15 [id number]    Clinical Impression Statement The patient states he wants to ride his bike on the [place] tomorrow.  Advised against this since his pain has been so easily aggravated with even low to moderate level exercise.  He lacks lower abdominal and lumbar multifidi activation to safely stabilize his lumbar spine to perform dynamic physical activity at this time.  Significant muscle trembling will all ther ex today.  Needs mod verbal cuing and tactile cues for proper muscle activation and to avoid compensatory substitution patterns.  Therapist closely monitoring pain throughout.  Patient denies pain at present stating he won't know until tomorrow.     PT Next Visit Plan Check carryover neutral spine stabilization in supine, quadruped and prone.  Progress slowly with ther ex.  ?Add standing band rows and extensions with ab brace; no rotation        Problem List Patient Active Problem List   Diagnosis Date Noted  . Left lumbar radiculitis 04/21/2015  . HNP (herniated nucleus pulposus), lumbar 04/21/2015  . Chondromalacia of both patellae 02/24/2015  . Paranoid schizophrenia, chronic condition ([place]) 02/24/2015  . Chronic low back pain 07/18/2014  . Knee pain, acute 07/18/2014  . Right ankle pain 01/27/2014  . Great toe pain 01/27/2014  . Sciatica 01/10/2012  . Thoracic or lumbosacral neuritis or radiculitis, unspecified 12/15/2011  . Ulnar neuropathy at elbow 11/14/2011  . Disturbance of skin sensation 11/14/2011    Alvera Singh 07/17/2015, 9:02 AM  [facility] [address] [place], [name], [address] Phone: [phone]   Fax:  [phone]  Name: Glenn Robertson MRN: [id number] Date of Birth: 1966/06/01   Ruben Im, PT 07/17/2015 9:03 AM Phone:  [phone] Fax: [phone]

## 2015-07-17 NOTE — Patient Instructions (Signed)
Isometric Hold (Quadruped)   On hands and knees, slowly inhale, and then exhale. Pull navel toward spine and Hold for ___ seconds. Continue to breathe in and out during hold. Rest for ___ seconds. Repeat __5_ times. Do _1__ times a day.   Copyright  VHI. All rights reserved.  Bracing With Arm Raise (Quadruped)   On hands and knees find neutral spine. Tighten pelvic floor and abdominals and hold. Alternately lift arm to shoulder level. Repeat __5_ times. Do ___ times a day.   Copyright  VHI. All rights reserved.  Bracing With Leg Raise (Quadruped)   On hands and knees find neutral spine. Tighten pelvic floor and abdominals and hold. Alternating legs, straighten and lift to hip level. Repeat _5__ times. Do _1__ times a day.   Copyright  VHI. All rights reserved.  Bracing With Arm / Leg Raise (Quadruped)   On hands and knees find neutral spine. Tighten pelvic floor and abdominals and hold. Alternating, lift arm to shoulder level and opposite leg to hip level. Repeat _5__ times. Do __1_ times a day.   Copyright  VHI. All rights reserved.

## 2015-07-20 ENCOUNTER — Ambulatory Visit: Payer: Medicare Other | Admitting: Physical Therapy

## 2015-07-20 DIAGNOSIS — M5416 Radiculopathy, lumbar region: Secondary | ICD-10-CM

## 2015-07-20 DIAGNOSIS — M659 Synovitis and tenosynovitis, unspecified: Secondary | ICD-10-CM

## 2015-07-20 DIAGNOSIS — M6281 Muscle weakness (generalized): Secondary | ICD-10-CM

## 2015-07-20 DIAGNOSIS — M2560 Stiffness of unspecified joint, not elsewhere classified: Secondary | ICD-10-CM

## 2015-07-20 DIAGNOSIS — M7651 Patellar tendinitis, right knee: Secondary | ICD-10-CM

## 2015-07-20 NOTE — Therapy (Signed)
[place], [name], [address] Phone: [phone]   Fax:  [phone]  Physical Therapy Treatment  Patient Details  Name: Glenn Robertson MRN: [id number] Date of Birth: [birth year] Referring Provider: Dr. Alysia Penna  Encounter Date: 07/20/2015      PT End of Session - 07/20/15 1152    Visit Number 5   Number of Visits 16   Date for PT Re-Evaluation 08/21/15   Authorization Type Medicare G codes; 10th visit prog note; Kx visit 15   PT Start Time 1145   PT Stop Time 1230   PT Time Calculation (min) 45 min      Past Medical History  Diagnosis Date  . Diabetes mellitus   . Neuromuscular disorder ([place])   . Allergy   . Asthma   . Chronic pain   . Depression     Past Surgical History  Procedure Laterality Date  . Arm surgery  1/12    rt ulnar nerve decompression  . Epidural block injection      several  . Ulnar nerve transposition  01/19/2012    Procedure: ULNAR NERVE DECOMPRESSION/TRANSPOSITION;  Surgeon: Cammie Sickle., MD;  Location: [place];  Service: Orthopedics;  Laterality: Left;  ulnar nerve decompression vs transposition left elbow    There were no vitals filed for this visit.  Visit Diagnosis:  Lumbar radiculitis  Joint stiffness of spine  Muscle weakness  Tenosynovitis, wrist  Patellar tendonitis of right knee                       [facility] Adult PT Treatment/Exercise - 07/20/15 1155    Lumbar Exercises: Aerobic   Stationary Bike NU-Step L4 UE/LE 5 min   Lumbar Exercises: Supine   Ab Set 10 reps   Bent Knee Raise 10 reps   Bent Knee Raise Limitations level 1, level 2    Isometric Hip Flexion 10 reps   Isometric Hip Flexion Limitations obliques x10   Lumbar Exercises: Sidelying   Other Sidelying Lumbar Exercises side plank from knees 10 sec 20 sec  bilateral   Lumbar Exercises: Prone   Other Prone Lumbar Exercises plank on elbows  and knees 1 minute- poor motor control   Other Prone Lumbar Exercises Multifidus activation x 10 -good contraction, then with  extensions and donkey kicks x 10 each bilateral with tactile cues throughout.   head lift with Is and Ts 5x each   Lumbar Exercises: Quadruped   Opposite Arm/Leg Raise 10 reps                  PT Short Term Goals - 07/17/15 0901    PT SHORT TERM GOAL #1   Title Patient will be able to perform /resume low to moderate level neural mobliity and core strengthening without exacerbation of pain.     Time 4   Period Weeks   Status On-going   PT SHORT TERM GOAL #2   Title Patient will have a 25% improvement in pain and function.   Time 4   Period Weeks   Status Achieved           PT Long Term Goals - 07/17/15 0901    PT LONG TERM GOAL #1   Title Independent with safe self progression of HEP for further ROM and strengthening   Time 8   Period Weeks   Status On-going   PT LONG TERM GOAL #2  Title Left hip abduction strength  improved to 4/5 needed for prolonged standing and walking   Time 8   Period Weeks   Status On-going   PT LONG TERM GOAL #3   Title Core muscle strength improved to 4+/5 needed for prolonged walking and standing tolerance   Time 8   Period Weeks   Status On-going   PT LONG TERM GOAL #4   Title Oswestry Index improved to minimal self perceived disabilty 20% or less   Status Achieved   PT LONG TERM GOAL #5   Title Lumbar AROM flexion and extension improved to 70 degrees and 30 degrees needed for greater mobility with transitional movements   Time 8   Period Weeks               Plan - 07/20/15 1504    Clinical Impression Statement Pt continues to deny lumbar or radicular symptoms and reported no pain after therex today. Continued to recommend consistent HEP at this level to prepare for more advanced exercises and activities. Pt still with questions about cycling which were previously  answered by PT. Pt continues to  demonstrate poor motor control with core exercises.   PT Next Visit Plan Check carryover neutral spine stabilization in supine, quadruped and prone.  Progress slowly with ther ex.  ?Add standing band rows and extensions with ab brace; no rotation        Problem List Patient Active Problem List   Diagnosis Date Noted  . Left lumbar radiculitis 04/21/2015  . HNP (herniated nucleus pulposus), lumbar 04/21/2015  . Chondromalacia of both patellae 02/24/2015  . Paranoid schizophrenia, chronic condition ([place]) 02/24/2015  . Chronic low back pain 07/18/2014  . Knee pain, acute 07/18/2014  . Right ankle pain 01/27/2014  . Great toe pain 01/27/2014  . Sciatica 01/10/2012  . Thoracic or lumbosacral neuritis or radiculitis, unspecified 12/15/2011  . Ulnar neuropathy at elbow 11/14/2011  . Disturbance of skin sensation 11/14/2011    Dorene Ar, PTA 07/20/2015, 3:10 PM  [facility] [address] [place], [name], [address] Phone: [phone]   Fax:  [phone]  Name: Glenn Robertson MRN: [id number] Date of Birth: 1965-11-15

## 2015-07-21 ENCOUNTER — Ambulatory Visit: Payer: Medicare Other | Admitting: Physical Therapy

## 2015-07-21 DIAGNOSIS — M2560 Stiffness of unspecified joint, not elsewhere classified: Secondary | ICD-10-CM

## 2015-07-21 DIAGNOSIS — M5416 Radiculopathy, lumbar region: Secondary | ICD-10-CM | POA: Diagnosis not present

## 2015-07-21 DIAGNOSIS — M6281 Muscle weakness (generalized): Secondary | ICD-10-CM

## 2015-07-21 NOTE — Therapy (Signed)
[place], [name], [address] Phone: [phone]   Fax:  [phone]  Physical Therapy Treatment  Patient Details  Name: Glenn Robertson MRN: [id number] Date of Birth: 1966-03-08 Referring Provider: Dr. Alysia Penna  Encounter Date: 07/21/2015      PT End of Session - 07/21/15 0919    Visit Number 6   Number of Visits 16   Date for PT Re-Evaluation 08/21/15   Authorization Type Medicare G codes; 10th visit prog note; Kx visit 15   PT Start Time 0845   PT Stop Time 0930   PT Time Calculation (min) 45 min   Activity Tolerance Patient tolerated treatment well      Past Medical History  Diagnosis Date  . Diabetes mellitus   . Neuromuscular disorder ([place])   . Allergy   . Asthma   . Chronic pain   . Depression     Past Surgical History  Procedure Laterality Date  . Arm surgery  1/12    rt ulnar nerve decompression  . Epidural block injection      several  . Ulnar nerve transposition  01/19/2012    Procedure: ULNAR NERVE DECOMPRESSION/TRANSPOSITION;  Surgeon: Cammie Sickle., MD;  Location: [place];  Service: Orthopedics;  Laterality: Left;  ulnar nerve decompression vs transposition left elbow    There were no vitals filed for this visit.  Visit Diagnosis:  Lumbar radiculitis  Joint stiffness of spine  Muscle weakness      Subjective Assessment - 07/21/15 0849    Subjective Patient states he usually feels pain the next day and today he is "in the clear."  I've been playing on the computer video games and doing [place] homework.  Driving to [facility] today.  "My back hurts after I've been sitting."  No leg pain.     Currently in Pain? Yes   Pain Score 1    Pain Location Back                         [facility] Adult PT Treatment/Exercise - 07/21/15 0852    Lumbar Exercises: Aerobic   Stationary Bike NU-Step L4 UE/LE 7 min   Lumbar Exercises: Supine   Bent Knee Raise 10 reps   Lumbar Exercises: Prone   Other Prone Lumbar Exercises prone press ups 10x   Lumbar Exercises: Quadruped   Opposite Arm/Leg Raise 10 reps   Knee/Hip Exercises: Machines for Strengthening   Cybex Leg Press 40# B; R/L single leg 20# 20x each   Hip Cybex 30# B hip ext and hip abd 20x each   Shoulder Exercises: Standing   Extension Strengthening;Both;20 reps;Theraband   Row Strengthening;Both;20 reps;Theraband   Theraband Level (Shoulder Row) --  red band                  PT Short Term Goals - 07/21/15 1259    PT SHORT TERM GOAL #1   Title Patient will be able to perform /resume low to moderate level neural mobliity and core strengthening without exacerbation of pain.     Time 4   Period Weeks   Status Achieved   PT SHORT TERM GOAL #2   Title Patient will have a 25% improvement in pain and function.   Status Achieved           PT Long Term Goals - 07/21/15 1259    PT LONG TERM GOAL #1  Title Independent with safe self progression of HEP for further ROM and strengthening  08/21/15   Time 8   Period Weeks   Status On-going   PT LONG TERM GOAL #2   Title Left hip abduction strength  improved to 4/5 needed for prolonged standing and walking   Time 8   Period Weeks   Status On-going   PT LONG TERM GOAL #3   Title Core muscle strength improved to 4+/5 needed for prolonged walking and standing tolerance   Time 8   Period Weeks   Status On-going   PT LONG TERM GOAL #4   Title Oswestry Index improved to minimal self perceived disabilty 20% or less   Time 8   Period Weeks   Status Achieved   PT LONG TERM GOAL #5   Title Lumbar AROM flexion and extension improved to 70 degrees and 30 degrees needed for greater mobility with transitional movements   Time 8   Period Weeks   Status On-going               Plan - 07/21/15 1254    Clinical Impression Statement The patient states he has not had an adverse response the day after  therapy in his last few sessions.  He feels he is making progress with PT.  He states this is the first time he has done the leg press since before his back flared up .  No leg pain in several days.  He is able to participate in a moderate intensity neutral spine stabilization without an increase of symptoms.  Left hip weakness and core weakness evident during ther ex.  Fewer verbal and tactile cues needed for proper technique.     PT Next Visit Plan Continue neutral spine stabilization in supine, quadruped, prone and standing;  Hip strengthening;  Progress slowly with ther ex to avoid pain exacerbation;    no rotation        Problem List Patient Active Problem List   Diagnosis Date Noted  . Left lumbar radiculitis 04/21/2015  . HNP (herniated nucleus pulposus), lumbar 04/21/2015  . Chondromalacia of both patellae 02/24/2015  . Paranoid schizophrenia, chronic condition ([place]) 02/24/2015  . Chronic low back pain 07/18/2014  . Knee pain, acute 07/18/2014  . Right ankle pain 01/27/2014  . Great toe pain 01/27/2014  . Sciatica 01/10/2012  . Thoracic or lumbosacral neuritis or radiculitis, unspecified 12/15/2011  . Ulnar neuropathy at elbow 11/14/2011  . Disturbance of skin sensation 11/14/2011    Alvera Singh 07/21/2015, 1:01 PM  [facility] [address] [place], [name], [address] Phone: [phone]   Fax:  [phone]  Name: Glenn Robertson MRN: [id number] Date of Birth: 09-17-1965  Ruben Im, PT 07/21/2015 1:01 PM Phone: [phone] Fax: [phone]

## 2015-07-26 ENCOUNTER — Ambulatory Visit
Admission: RE | Admit: 2015-07-26 | Discharge: 2015-07-26 | Disposition: A | Discharge status: Home / Self Care | Payer: Medicare Other | Source: Ambulatory Visit | Attending: Orthopedic Surgery | Admitting: Orthopedic Surgery

## 2015-07-26 ENCOUNTER — Other Ambulatory Visit: Payer: Medicare Other

## 2015-07-26 DIAGNOSIS — M25561 Pain in right knee: Secondary | ICD-10-CM

## 2015-07-27 ENCOUNTER — Ambulatory Visit (HOSPITAL_BASED_OUTPATIENT_CLINIC_OR_DEPARTMENT_OTHER): Payer: Medicare Other | Admitting: Physical Medicine & Rehabilitation

## 2015-07-27 ENCOUNTER — Encounter: Payer: Self-pay | Admitting: Physical Medicine & Rehabilitation

## 2015-07-27 VITALS — BP 112/70 | HR 88 | Resp 16

## 2015-07-27 DIAGNOSIS — M5417 Radiculopathy, lumbosacral region: Secondary | ICD-10-CM

## 2015-07-27 DIAGNOSIS — M545 Low back pain: Secondary | ICD-10-CM | POA: Diagnosis not present

## 2015-07-27 MED ORDER — GABAPENTIN 300 MG PO CAPS: 600.0000 mg | ORAL_CAPSULE | Freq: Two times a day (BID) | ORAL | Status: DC

## 2015-07-27 NOTE — Patient Instructions (Signed)
You received an epidural steroid injection under fluoroscopic guidance. This is the most accurate way to perform an epidural injection. This injection was performed to relieve thigh or leg or foot pain that may be related to a pinched nerve in the lumbar spine. The local anesthetic injected today may cause numbness in your leg for a couple hours. If it is severe we may need to observe you for 30-60 minutes after the injection. The cortisone medicine injected today may take several days to take full effect. This medicine can also cause facial flushing or feeling of being warm.  This injection may last for days weeks or months. It can be repeated if needed. If it is not effective, another spinal level may need to be injected. Other treatments include medication management as well as physical therapy. In some cases surgery may be an option.  Left S1

## 2015-07-27 NOTE — Progress Notes (Signed)
  PROCEDURE RECORD [facility]   Name: Alister Schwass DOB:1965/09/24 MRN: [id number]  Date:07/27/2015  Physician: Alysia Penna, MD    Nurse/CMA: Mancel Parsons  Allergies:  Allergies  Allergen Reactions  . Amoxicillin   . Food     Oranges or anything with citric acid    Consent Signed: Yes.    Is patient diabetic? Yes.    CBG today? ?  Pregnant: No. LMP: No LMP for male patient. (age 48-55)  Anticoagulants: no Anti-inflammatory: no Antibiotics: no  Procedure: left S1 transforaminal epidural steroid injection  Position: Prone Start Time:1:20 pm      End Time: 1:25pm  Fluoro Time: 25  RN/CMA Rolan Bucco Shaylon Aden    Time 1:00pm 1:28 pm    BP 112/70 125/68    Pulse 88 89    Respirations 16 16    O2 Sat 98 98    S/S 6 6    Pain Level 3/10 6/10     D/C home with Griffin Basil, patient A & O X 3, D/C instructions reviewed, and sits independently.

## 2015-07-27 NOTE — Progress Notes (Signed)
Left S1 transforaminal epidural steroid injection under fluoroscopic guidance  Indication: Lumbosacral radiculitis is not relieved by medication management or other conservative care and interfering with self-care and mobility.   Informed consent was obtained after describing risk and benefits of the procedure with the patient, this includes bleeding, bruising, infection, paralysis and medication side effects.  The patient wishes to proceed and has given written consent.  Patient was placed in prone position.  The lumbar area was marked and prepped with Betadine.  It was entered with a 25-gauge 1-1/2 inch needle and one mL of 1% lidocaine was injected into the skin and subcutaneous tissue.  Then a 22-gauge 3.5in spinal needle was inserted into the Left S1  sacral foramen under AP, lateral, and oblique view.  Then a solution containing one mL of 10 mg per mL dexamethasone and 2 mL of 1% lidocaine was injected.  The patient tolerated procedure well.  Post procedure instructions were given.  Please see post procedure form. 

## 2015-07-29 ENCOUNTER — Ambulatory Visit: Payer: Medicare Other | Admitting: Physical Therapy

## 2015-07-29 DIAGNOSIS — M659 Synovitis and tenosynovitis, unspecified: Secondary | ICD-10-CM

## 2015-07-29 DIAGNOSIS — M5416 Radiculopathy, lumbar region: Secondary | ICD-10-CM

## 2015-07-29 DIAGNOSIS — M7651 Patellar tendinitis, right knee: Secondary | ICD-10-CM

## 2015-07-29 DIAGNOSIS — M2560 Stiffness of unspecified joint, not elsewhere classified: Secondary | ICD-10-CM

## 2015-07-29 DIAGNOSIS — M6281 Muscle weakness (generalized): Secondary | ICD-10-CM

## 2015-07-29 NOTE — Therapy (Signed)
[place], [name], [address] Phone: [phone]   Fax:  [phone]  Physical Therapy Treatment  Patient Details  Name: Glenn Robertson MRN: [id number] Date of Birth: 11-Feb-1966 Referring Provider: Dr. Alysia Penna  Encounter Date: 07/29/2015      PT End of Session - 07/29/15 1025    Visit Number 7   Number of Visits 16   Date for PT Re-Evaluation 08/21/15   PT Start Time [id number]   PT Stop Time 1100   PT Time Calculation (min) 45 min      Past Medical History  Diagnosis Date  . Diabetes mellitus   . Neuromuscular disorder ([place])   . Allergy   . Asthma   . Chronic pain   . Depression     Past Surgical History  Procedure Laterality Date  . Arm surgery  1/12    rt ulnar nerve decompression  . Epidural block injection      several  . Ulnar nerve transposition  01/19/2012    Procedure: ULNAR NERVE DECOMPRESSION/TRANSPOSITION;  Surgeon: Cammie Sickle., MD;  Location: [facility];  Service: Orthopedics;  Laterality: Left;  ulnar nerve decompression vs transposition left elbow    There were no vitals filed for this visit.  Visit Diagnosis:  Lumbar radiculitis  Joint stiffness of spine  Muscle weakness  Tenosynovitis, wrist  Patellar tendonitis of right knee      Subjective Assessment - 07/29/15 1020    Subjective Patient is feeling good today, he said he wasn't sore after last treatment.  Had a cortisone shot it is the last one for awhile.   Currently in Pain? No/denies   Pain Score 0-No pain   Pain Location Back   Pain Orientation Mid   Pain Type Chronic pain                         [facility] Adult PT Treatment/Exercise - 07/29/15 1029    Lumbar Exercises: Aerobic   Stationary Bike NU-Step L4 UE/LE 7 min   Lumbar Exercises: Supine   Bent Knee Raise Heels slides with physioball 10 reps  10 reps with 10 second holds   Lumbar Exercises: Quadruped   Opposite Arm/Leg Raise Planks on knees Alt Arm raise Alt Leg raise 10 reps 2 reps x 1 minute holds 10 x 5 sec holds 10 x 5 sec holds   Knee/Hip Exercises: Machines for Strengthening   Cybex Leg Press 60# B; R/L single leg 20# 2 sets 20x each   Hip Cybex 30# B hip ext and hip abd 2 sets 20x each   Shoulder Exercises: Standing   Extension Strengthening;Both;20 reps;Theraband   Row Strengthening;Both;20 reps;Theraband   Theraband Level (Shoulder Row) --  red band                  PT Short Term Goals - 07/21/15 1259    PT SHORT TERM GOAL #1   Title Patient will be able to perform /resume low to moderate level neural mobliity and core strengthening without exacerbation of pain.     Time 4   Period Weeks   Status Achieved   PT SHORT TERM GOAL #2   Title Patient will have a 25% improvement in pain and function.   Status Achieved           PT Long Term Goals - 07/21/15 1259    PT LONG TERM GOAL #1  Title Independent with safe self progression of HEP for further ROM and strengthening  08/21/15   Time 8   Period Weeks   Status On-going   PT LONG TERM GOAL #2   Title Left hip abduction strength  improved to 4/5 needed for prolonged standing and walking   Time 8   Period Weeks   Status On-going   PT LONG TERM GOAL #3   Title Core muscle strength improved to 4+/5 needed for prolonged walking and standing tolerance   Time 8   Period Weeks   Status On-going   PT LONG TERM GOAL #4   Title Oswestry Index improved to minimal self perceived disabilty 20% or less   Time 8   Period Weeks   Status Achieved   PT LONG TERM GOAL #5   Title Lumbar AROM flexion and extension improved to 70 degrees and 30 degrees needed for greater mobility with transitional movements   Time 8   Period Weeks   Status On-going               Plan - 07/29/15 1033    Clinical Impression Statement Pt. has no increase in pain from the Tx. and is just starting to return to the gym again  and do "light lifting" Continued to work on core stability and to advance strengthening with more weight and reps on machines (see flowchart)   PT Next Visit Plan Continue neutral spine stabilization in supine, quadruped, prone and standing;  Hip strengthening;  Progress slowly with ther ex to avoid pain exacerbation; although he handled last session well.    no rotation        Problem List Patient Active Problem List   Diagnosis Date Noted  . Left lumbar radiculitis 04/21/2015  . HNP (herniated nucleus pulposus), lumbar 04/21/2015  . Chondromalacia of both patellae 02/24/2015  . Paranoid schizophrenia, chronic condition ([place]) 02/24/2015  . Chronic low back pain 07/18/2014  . Knee pain, acute 07/18/2014  . Right ankle pain 01/27/2014  . Great toe pain 01/27/2014  . Sciatica 01/10/2012  . Thoracic or lumbosacral neuritis or radiculitis, unspecified 12/15/2011  . Ulnar neuropathy at elbow 11/14/2011  . Disturbance of skin sensation 11/14/2011    Laury Axon, SPTA 07/29/2015 11:00 AM PHONE:[phone] [place], [name], [address] Phone: [phone]   Fax:  [phone]  Name: Glenn Robertson MRN: [id number] Date of Birth: 06/24/1966

## 2015-07-31 ENCOUNTER — Encounter: Payer: Self-pay | Admitting: Physical Medicine & Rehabilitation

## 2015-07-31 ENCOUNTER — Ambulatory Visit (HOSPITAL_BASED_OUTPATIENT_CLINIC_OR_DEPARTMENT_OTHER): Payer: Medicare Other | Admitting: Physical Medicine & Rehabilitation

## 2015-07-31 ENCOUNTER — Ambulatory Visit: Payer: Medicare Other | Attending: Physical Medicine & Rehabilitation | Admitting: Physical Therapy

## 2015-07-31 ENCOUNTER — Encounter: Payer: Medicare Other | Attending: Physical Medicine & Rehabilitation

## 2015-07-31 VITALS — BP 119/80 | HR 86 | Resp 14

## 2015-07-31 DIAGNOSIS — M2560 Stiffness of unspecified joint, not elsewhere classified: Secondary | ICD-10-CM | POA: Diagnosis present

## 2015-07-31 DIAGNOSIS — M6281 Muscle weakness (generalized): Secondary | ICD-10-CM | POA: Diagnosis present

## 2015-07-31 DIAGNOSIS — M25561 Pain in right knee: Secondary | ICD-10-CM | POA: Diagnosis not present

## 2015-07-31 DIAGNOSIS — G8929 Other chronic pain: Secondary | ICD-10-CM | POA: Insufficient documentation

## 2015-07-31 DIAGNOSIS — M545 Low back pain: Secondary | ICD-10-CM | POA: Insufficient documentation

## 2015-07-31 DIAGNOSIS — M7651 Patellar tendinitis, right knee: Secondary | ICD-10-CM | POA: Diagnosis present

## 2015-07-31 DIAGNOSIS — R209 Unspecified disturbances of skin sensation: Secondary | ICD-10-CM

## 2015-07-31 DIAGNOSIS — M6588 Other synovitis and tenosynovitis, other site: Secondary | ICD-10-CM | POA: Insufficient documentation

## 2015-07-31 DIAGNOSIS — M5416 Radiculopathy, lumbar region: Secondary | ICD-10-CM | POA: Insufficient documentation

## 2015-07-31 NOTE — Therapy (Signed)
[place], [name], [address] Phone: [phone]   Fax:  [phone]  Physical Therapy Treatment  Patient Details  Name: Glenn Robertson MRN: [id number] Date of Birth: 1966/01/05 Referring Provider: Dr. Alysia Penna  Encounter Date: 07/31/2015      PT End of Session - 07/31/15 0835    Visit Number 8   Number of Visits 16   Date for PT Re-Evaluation 08/21/15   Authorization Type Medicare G codes; 10th visit prog note; Kx visit 15   PT Start Time 0755   PT Stop Time 0840   PT Time Calculation (min) 45 min   Activity Tolerance Patient tolerated treatment well      Past Medical History  Diagnosis Date  . Diabetes mellitus   . Neuromuscular disorder ([place])   . Allergy   . Asthma   . Chronic pain   . Depression     Past Surgical History  Procedure Laterality Date  . Arm surgery  1/12    rt ulnar nerve decompression  . Epidural block injection      several  . Ulnar nerve transposition  01/19/2012    Procedure: ULNAR NERVE DECOMPRESSION/TRANSPOSITION;  Surgeon: Cammie Sickle., MD;  Location: [facility];  Service: Orthopedics;  Laterality: Left;  ulnar nerve decompression vs transposition left elbow    There were no vitals filed for this visit.  Visit Diagnosis:  Lumbar radiculitis  Joint stiffness of spine  Muscle weakness      Subjective Assessment - 07/31/15 0758    Subjective Tried to lift weights at the gym yesterday and "that didn't go well.  I forgot to hold in my abdominals."  Patient states he left his HEP papers in [place] and so he forgot some of the exercises.     Currently in Pain? Yes   Pain Score 3    Pain Location Back   Pain Orientation Right   Pain Type Chronic pain   Pain Onset More than a month ago   Pain Frequency Constant                         [facility] Adult PT Treatment/Exercise - 07/31/15 0802    Lumbar Exercises: Aerobic   Stationary Bike NU-Step L4 UE/LE 7 min   Lumbar Exercises: Standing   Other Standing Lumbar Exercises red band around thigh side step, cowboys and tight rope walk    Other Standing Lumbar Exercises ab brace with green band rows and shoulder extensions 20x each   Lumbar Exercises: Supine   Ab Set 5 reps   Bent Knee Raise 10 reps   Lumbar Exercises: Prone   Other Prone Lumbar Exercises Multifidus activation x 10 -good contraction, then with  extensions and donkey kicks x 10 each bilateral with tactile cues throughout.   head lift with Is and Ts 5x each   Lumbar Exercises: Quadruped   Opposite Arm/Leg Raise 10 reps   Knee/Hip Exercises: Machines for Strengthening   Hip Cybex 30# B hip ext and hip abd 20x each   Knee/Hip Exercises: Standing   Walking with Sports Cord BW and FW 5x Plate C                PT Education - 07/31/15 0833    Education provided Yes   Education Details reissued HEP ab series and multifidi series   Person(s) Educated Patient   Methods Handout   Comprehension Verbalized  understanding;Returned demonstration          PT Short Term Goals - 07/31/15 0844    PT SHORT TERM GOAL #1   Title Patient will be able to perform /resume low to moderate level neural mobliity and core strengthening without exacerbation of pain.     Status Achieved   PT SHORT TERM GOAL #2   Title Patient will have a 25% improvement in pain and function.   Status Achieved           PT Long Term Goals - 07/31/15 [id number]    PT LONG TERM GOAL #1   Title Independent with safe self progression of HEP for further ROM and strengthening  08/21/15   Time 8   Period Weeks   Status On-going   PT LONG TERM GOAL #2   Title Left hip abduction strength  improved to 4/5 needed for prolonged standing and walking   Time 8   Period Weeks   Status On-going   PT LONG TERM GOAL #3   Title Core muscle strength improved to 4+/5 needed for prolonged walking and standing tolerance   Time 8   Period  Weeks   Status On-going   PT LONG TERM GOAL #4   Title Oswestry Index improved to minimal self perceived disabilty 20% or less   Status Achieved   PT LONG TERM GOAL #5   Title Lumbar AROM flexion and extension improved to 70 degrees and 30 degrees needed for greater mobility with transitional movements   Time 8   Period Weeks   Status On-going               Plan - 07/31/15 0835    Clinical Impression Statement The patient reports straining his back at the gym pushing on the leg press.  Discussed lightening the intensity and working on increased reps, incorporating ab brace with all ex at the gym and exhaling on exertion as strategies to use.  Patient left his HEP in [place] so new handout provided and review.  He continues to need verbal and tactile cues to avoid lumbar rotation and hyperextension.  Patient denies an increase in pain after session.   PT Next Visit Plan Continue neutral spine stabilization in supine, quadruped, prone and standing;  Hip strengthening;    no rotation        Problem List Patient Active Problem List   Diagnosis Date Noted  . Left lumbar radiculitis 04/21/2015  . HNP (herniated nucleus pulposus), lumbar 04/21/2015  . Chondromalacia of both patellae 02/24/2015  . Paranoid schizophrenia, chronic condition ([place]) 02/24/2015  . Chronic low back pain 07/18/2014  . Knee pain, acute 07/18/2014  . Right ankle pain 01/27/2014  . Great toe pain 01/27/2014  . Sciatica 01/10/2012  . Thoracic or lumbosacral neuritis or radiculitis, unspecified 12/15/2011  . Ulnar neuropathy at elbow 11/14/2011  . Disturbance of skin sensation 11/14/2011    Alvera Singh 07/31/2015, 9:18 AM  [facility] [address] [place], [name], [address] Phone: [phone]   Fax:  [phone]  Name: Glenn Robertson MRN: [id number] Date of Birth: 02/09/1966    Ruben Im, PT 07/31/2015 9:19 AM Phone: [phone] Fax:  [phone]

## 2015-07-31 NOTE — Progress Notes (Signed)
EMG/NCV of the lower extremities performed, To evaluate left lower extremity pain. History of diabetes as well as L5-S1 disc protrusion. Please see media tab for full report   Normal EMG

## 2015-07-31 NOTE — Patient Instructions (Signed)
Reissued prone multifidi series handout and ab brace series 1-3

## 2015-07-31 NOTE — Patient Instructions (Signed)
Your EMG was normal Nerve damage from a pinched nerve and no sign of diabetic neuropathy

## 2015-08-03 ENCOUNTER — Ambulatory Visit: Payer: Medicare Other | Admitting: Physical Therapy

## 2015-08-03 DIAGNOSIS — M2560 Stiffness of unspecified joint, not elsewhere classified: Secondary | ICD-10-CM

## 2015-08-03 DIAGNOSIS — M659 Synovitis and tenosynovitis, unspecified: Secondary | ICD-10-CM

## 2015-08-03 DIAGNOSIS — M5416 Radiculopathy, lumbar region: Secondary | ICD-10-CM

## 2015-08-03 DIAGNOSIS — M7651 Patellar tendinitis, right knee: Secondary | ICD-10-CM

## 2015-08-03 DIAGNOSIS — M6281 Muscle weakness (generalized): Secondary | ICD-10-CM

## 2015-08-03 NOTE — Therapy (Signed)
[place], [name], [address] Phone: [phone]   Fax:  [phone]  Physical Therapy Treatment  Patient Details  Name: Glenn Robertson MRN: [id number] Date of Birth: May 05, 1966 Referring Provider: Dr. Alysia Penna  Encounter Date: 08/03/2015      PT End of Session - 08/03/15 1133    Visit Number 9   Number of Visits 16   Date for PT Re-Evaluation 08/21/15   PT Start Time 1100   PT Stop Time 1145   PT Time Calculation (min) 45 min      Past Medical History  Diagnosis Date  . Diabetes mellitus   . Neuromuscular disorder ([place])   . Allergy   . Asthma   . Chronic pain   . Depression     Past Surgical History  Procedure Laterality Date  . Arm surgery  1/12    rt ulnar nerve decompression  . Epidural block injection      several  . Ulnar nerve transposition  01/19/2012    Procedure: ULNAR NERVE DECOMPRESSION/TRANSPOSITION;  Surgeon: Cammie Sickle., MD;  Location: [facility];  Service: Orthopedics;  Laterality: Left;  ulnar nerve decompression vs transposition left elbow    There were no vitals filed for this visit.  Visit Diagnosis:  Lumbar radiculitis  Joint stiffness of spine  Muscle weakness  Tenosynovitis, wrist  Patellar tendonitis of right knee      Subjective Assessment - 08/03/15 1105    Subjective I hadn't gone back to the gym since it didn't go well last time. I'm going to go back on Monday and take it easy maybe start riding a bike.   Currently in Pain? No/denies                         [facility] Adult PT Treatment/Exercise - 08/03/15 1312    Lumbar Exercises: Aerobic   Stationary Bike NU-Step L4 UE/LE 7 min   Lumbar Exercises: Standing   Other Standing Lumbar Exercises red band around thigh side step, cowboys and tight rope walk    Other Standing Lumbar Exercises ab brace with green band rows and shoulder extensions 20x each   Lumbar  Exercises: Prone   Other Prone Lumbar Exercises plank on knees x 47minutes, full plank 1 minute   Knee/Hip Exercises: Machines for Strengthening   Cybex Knee Flexion 3 plates 2 sets of 20   Cybex Leg Press 60# B then 80# 2 sets of 20 reps; R/L single leg 20# 20x each    Hip Cybex 30# B hip ext and hip abd 20x each                  PT Short Term Goals - 07/31/15 0844    PT SHORT TERM GOAL #1   Title Patient will be able to perform /resume low to moderate level neural mobliity and core strengthening without exacerbation of pain.     Status Achieved   PT SHORT TERM GOAL #2   Title Patient will have a 25% improvement in pain and function.   Status Achieved           PT Long Term Goals - 08/03/15 1137    PT LONG TERM GOAL #1   Title Independent with safe self progression of HEP for further ROM and strengthening  08/21/15   Time 8   Period Weeks   Status On-going   PT LONG TERM GOAL #2  Title Left hip abduction strength  improved to 4/5 needed for prolonged standing and walking   Time 8   Period Weeks   Status Achieved   PT LONG TERM GOAL #3   Title Core muscle strength improved to 4+/5 needed for prolonged walking and standing tolerance   Time 8   Period Weeks   Status On-going   PT LONG TERM GOAL #4   Title Oswestry Index improved to minimal self perceived disabilty 20% or less   Time 8   Period Weeks   Status Achieved   PT LONG TERM GOAL #5   Title Lumbar AROM flexion and extension improved to 70 degrees and 30 degrees needed for greater mobility with transitional movements   Time 8   Period Weeks   Status On-going               Plan - 08/03/15 1122    Clinical Impression Statement Next visit is patients 10th visit. Printed new HEP with original exercises as only partial HEP was printed last time per patient report. Patient was progressed with weight and reps on machines and core stabilization continued to be emphasized. Pt would benefit from  continued PT to improve core strength deficits.   PT Next Visit Plan 10th visit;Continue neutral spine stabilization in supine, quadruped, prone and standing and machines;  Hip strengthening;    no rotation        Problem List Patient Active Problem List   Diagnosis Date Noted  . Left lumbar radiculitis 04/21/2015  . HNP (herniated nucleus pulposus), lumbar 04/21/2015  . Chondromalacia of both patellae 02/24/2015  . Paranoid schizophrenia, chronic condition ([place]) 02/24/2015  . Chronic low back pain 07/18/2014  . Knee pain, acute 07/18/2014  . Right ankle pain 01/27/2014  . Great toe pain 01/27/2014  . Sciatica 01/10/2012  . Thoracic or lumbosacral neuritis or radiculitis, unspecified 12/15/2011  . Ulnar neuropathy at elbow 11/14/2011  . Disturbance of skin sensation 11/14/2011   Laury Axon, [place] 08/03/2015 1:17 PM [phone] [facility] [place], [name], [address] Phone: [phone]   Fax:  [phone]  Name: Glenn Robertson MRN: [id number] Date of Birth: 1966-03-20

## 2015-08-05 ENCOUNTER — Ambulatory Visit: Payer: Medicare Other | Admitting: Physical Therapy

## 2015-08-05 DIAGNOSIS — M65939 Unspecified synovitis and tenosynovitis, unspecified forearm: Secondary | ICD-10-CM

## 2015-08-05 DIAGNOSIS — M7651 Patellar tendinitis, right knee: Secondary | ICD-10-CM

## 2015-08-05 DIAGNOSIS — M5416 Radiculopathy, lumbar region: Secondary | ICD-10-CM

## 2015-08-05 DIAGNOSIS — M2560 Stiffness of unspecified joint, not elsewhere classified: Secondary | ICD-10-CM

## 2015-08-05 DIAGNOSIS — M6281 Muscle weakness (generalized): Secondary | ICD-10-CM

## 2015-08-05 DIAGNOSIS — M659 Synovitis and tenosynovitis, unspecified: Secondary | ICD-10-CM

## 2015-08-05 NOTE — Therapy (Addendum)
[place], [name], [address] Phone: [phone]   Fax:  [phone]  Physical Therapy Treatment  Patient Details  Name: Glenn Robertson MRN: [id number] Date of Birth: 04-Jan-1966 Referring Provider: Dr. Alysia Penna  Encounter Date: 08/05/2015      PT End of Session - 08/05/15 0853    Visit Number 10   Number of Visits 16   Date for PT Re-Evaluation 08/21/15   PT Start Time 0830   PT Stop Time 0915   PT Time Calculation (min) 45 min      Past Medical History  Diagnosis Date  . Diabetes mellitus   . Neuromuscular disorder ([place])   . Allergy   . Asthma   . Chronic pain   . Depression     Past Surgical History  Procedure Laterality Date  . Arm surgery  1/12    rt ulnar nerve decompression  . Epidural block injection      several  . Ulnar nerve transposition  01/19/2012    Procedure: ULNAR NERVE DECOMPRESSION/TRANSPOSITION;  Surgeon: Cammie Sickle., MD;  Location: [facility];  Service: Orthopedics;  Laterality: Left;  ulnar nerve decompression vs transposition left elbow    There were no vitals filed for this visit.  Visit Diagnosis:  Lumbar radiculitis  Joint stiffness of spine  Muscle weakness  Tenosynovitis, wrist  Patellar tendonitis of right knee      Subjective Assessment - 08/05/15 0847    Subjective My foots numb but its always been numb.    Currently in Pain? No/denies            [facility] PT Assessment - 08/05/15 0934    Observation/Other Assessments   Oswestry Disability Index  12%                     [facility] Adult PT Treatment/Exercise - 08/05/15 0934    Lumbar Exercises: Aerobic   Stationary Bike NU-Step L4 UE/LE 7 min   Lumbar Exercises: Quadruped   Opposite Arm/Leg Raise 10 reps   Opposite Arm/Leg Raise Limitations 2 sets   Plank 2 reps 1 minute holds   Knee/Hip Exercises: Machines for Strengthening   Cybex Knee Flexion 3  plates 2 sets of 20   Cybex Leg Press 60# B then 80# 2 sets of 20 reps; R/L single leg 20# 20x each    Hip Cybex 30# B hip ext and hip abd 20x each                  PT Short Term Goals - 07/31/15 0844    PT SHORT TERM GOAL #1   Title Patient will be able to perform /resume low to moderate level neural mobliity and core strengthening without exacerbation of pain.     Status Achieved   PT SHORT TERM GOAL #2   Title Patient will have a 25% improvement in pain and function.   Status Achieved           PT Long Term Goals - 08/03/15 1137    PT LONG TERM GOAL #1   Title Independent with safe self progression of HEP for further ROM and strengthening  08/21/15   Time 8   Period Weeks   Status On-going   PT LONG TERM GOAL #2   Title Left hip abduction strength  improved to 4/5 needed for prolonged standing and walking   Time 8   Period Weeks   Status  Achieved   PT LONG TERM GOAL #3   Title Core muscle strength improved to 4+/5 needed for prolonged walking and standing tolerance   Time 8   Period Weeks   Status On-going   PT LONG TERM GOAL #4   Title Oswestry Index improved to minimal self perceived disabilty 20% or less   Time 8   Period Weeks   Status Achieved   PT LONG TERM GOAL #5   Title Lumbar AROM flexion and extension improved to 70 degrees and 30 degrees needed for greater mobility with transitional movements   Time 8   Period Weeks   Status On-going               Plan - 08/05/15 0855    Clinical Impression Statement It was patient's 10th visit so Oswestry survey was performed. Continued with machine strengthening and advanced core excercises. Pt. would benefit from continued PT to progress towards Core strength and lumbar flexibility goals.   PT Next Visit Plan ;Continue neutral spine stabilization in supine, quadruped, prone and standing and machines;  Hip strengthening;    no rotation       G code mobility walking/moving around; Current  Ck Goal CJ During this treatment session, the therapist was present, participating in and directing the treatment.  Physical Therapy Progress Note  Dates of Reporting Period: 06/26/15 to 08/05/15  Objective Reports of Subjective Statement: See above  Objective Measurements:  See above;  See Oswestry  Goal Update: Achieved STGs, progressing with LTGs  Plan: Continue with treatment plan  Reason Skilled Services are Required: Instruction in safe self progression of HEP   Problem List Patient Active Problem List   Diagnosis Date Noted  . Left lumbar radiculitis 04/21/2015  . HNP (herniated nucleus pulposus), lumbar 04/21/2015  . Chondromalacia of both patellae 02/24/2015  . Paranoid schizophrenia, chronic condition ([place]) 02/24/2015  . Chronic low back pain 07/18/2014  . Knee pain, acute 07/18/2014  . Right ankle pain 01/27/2014  . Great toe pain 01/27/2014  . Sciatica 01/10/2012  . Thoracic or lumbosacral neuritis or radiculitis, unspecified 12/15/2011  . Ulnar neuropathy at elbow 11/14/2011  . Disturbance of skin sensation 11/14/2011  Ruben Im, PT 08/11/2015 1:42 PM Phone: [phone] Fax: [place] 08/05/2015 9:43 AM [phone]  Hessie Diener, PTA 08/05/2015 1:39 PM Phone: [phone] Fax: [facility] [address] [place], [name], [address] Phone: [phone]   Fax:  [phone]  Name: Glenn Robertson MRN: [id number] Date of Birth: 1965-10-31

## 2015-08-12 ENCOUNTER — Ambulatory Visit: Payer: Medicare Other | Admitting: Physical Therapy

## 2015-08-12 DIAGNOSIS — M2560 Stiffness of unspecified joint, not elsewhere classified: Secondary | ICD-10-CM

## 2015-08-12 DIAGNOSIS — M7651 Patellar tendinitis, right knee: Secondary | ICD-10-CM

## 2015-08-12 DIAGNOSIS — M5416 Radiculopathy, lumbar region: Secondary | ICD-10-CM | POA: Diagnosis not present

## 2015-08-12 DIAGNOSIS — M6281 Muscle weakness (generalized): Secondary | ICD-10-CM

## 2015-08-12 NOTE — Therapy (Signed)
[place], [name], [address] Phone: [phone]   Fax:  [phone]  Physical Therapy Treatment  Patient Details  Name: Glenn Robertson MRN: [id number] Date of Birth: [birth year] Referring Provider: Dr. Alysia Penna  Encounter Date: 08/12/2015      PT End of Session - 08/12/15 0844    Visit Number 11   Number of Visits 16   Date for PT Re-Evaluation 08/21/15   Authorization Type Medicare G codes; 10th visit prog note; Kx visit 15   PT Start Time 0845   PT Stop Time 0925   PT Time Calculation (min) 40 min      Past Medical History  Diagnosis Date  . Diabetes mellitus   . Neuromuscular disorder ([place])   . Allergy   . Asthma   . Chronic pain   . Depression     Past Surgical History  Procedure Laterality Date  . Arm surgery  1/12    rt ulnar nerve decompression  . Epidural block injection      several  . Ulnar nerve transposition  01/19/2012    Procedure: ULNAR NERVE DECOMPRESSION/TRANSPOSITION;  Surgeon: Cammie Sickle., MD;  Location: [facility];  Service: Orthopedics;  Laterality: Left;  ulnar nerve decompression vs transposition left elbow    There were no vitals filed for this visit.  Visit Diagnosis:  Lumbar radiculitis  Joint stiffness of spine  Muscle weakness  Patellar tendonitis of right knee      Subjective Assessment - 08/12/15 0846    Subjective Im going to see a back surgeon next month. I am tired of this numbness and these flare ups. I had some sharp pains in lumbar while sitting in computer chair this morning.    Currently in Pain? No/denies            [facility] PT Assessment - 08/12/15 0859    AROM   Lumbar Flexion 70  can touch toes   Lumbar Extension 22   Strength   Lumbar Flexion 4/5   Lumbar Extension 4+/5                     [facility] Adult PT Treatment/Exercise - 08/12/15 0906    Lumbar Exercises: Aerobic   Stationary Bike  NU-Step L6 UE/LE 5 min   Lumbar Exercises: Supine   Bent Knee Raise 10 reps   Bent Knee Raise Limitations scissors level 2 and level 3 with cues for neutral spine   Bridge 20 reps   Bridge Limitations with feet on ball, h/s curls and bridge with feet on ball   Straight Leg Raise 10 reps  left leg raise caused discomfort in left low back   Straight Leg Raises Limitations with ab set   Lumbar Exercises: Prone   Straight Leg Raise 20 reps   Straight Leg Raises Limitations 3 # x 20 each, then donkey kicks x 20 AROM   Other Prone Lumbar Exercises plank on elbows 1 minute   Other Prone Lumbar Exercises prone lumbar extension I, Bent T x 10 each   Lumbar Exercises: Quadruped   Opposite Arm/Leg Raise 10 reps   Opposite Arm/Leg Raise Limitations 2 sets  over physioball                  PT Short Term Goals - 07/31/15 0844    PT SHORT TERM GOAL #1   Title Patient will be able to perform /resume low to moderate  level neural mobliity and core strengthening without exacerbation of pain.     Status Achieved   PT SHORT TERM GOAL #2   Title Patient will have a 25% improvement in pain and function.   Status Achieved           PT Long Term Goals - 08/12/15 1003    PT LONG TERM GOAL #1   Title Independent with safe self progression of HEP for further ROM and strengthening  08/21/15   Time 8   Period Weeks   Status On-going   PT LONG TERM GOAL #2   Title Left hip abduction strength  improved to 4/5 needed for prolonged standing and walking   Time 8   Period Weeks   Status Achieved   PT LONG TERM GOAL #3   Title Core muscle strength improved to 4+/5 needed for prolonged walking and standing tolerance   Time 8   Period Weeks   Status Partially Met   PT LONG TERM GOAL #4   Title Oswestry Index improved to minimal self perceived disabilty 20% or less   Time 8   Period Weeks   Status Achieved   PT LONG TERM GOAL #5   Title Lumbar AROM flexion and extension improved to 70  degrees and 30 degrees needed for greater mobility with transitional movements   Time 8   Period Weeks   Status Partially Met               Plan - 08/12/15 1009    Clinical Impression Statement Pt Lumbar flexion has improved to 70 degrees, extension 22. His Lumbar flexion strength remains at a 4/5 and lumbar extension strength has improved to 4+/5. Partially MET LTG#  3, #5. Trial of upper abdominal strength closely monitoring for pain as well as progression of lower abdominal strength to scissors level 2 and 3.    PT Next Visit Plan ;Continue neutral spine stabilization in supine, quadruped, prone and standing and machines;  Hip strengthening;    no rotation- pt with questions about abdominal strengthening - discuss with PT next visit.         Problem List Patient Active Problem List   Diagnosis Date Noted  . Left lumbar radiculitis 04/21/2015  . HNP (herniated nucleus pulposus), lumbar 04/21/2015  . Chondromalacia of both patellae 02/24/2015  . Paranoid schizophrenia, chronic condition ([place]) 02/24/2015  . Chronic low back pain 07/18/2014  . Knee pain, acute 07/18/2014  . Right ankle pain 01/27/2014  . Great toe pain 01/27/2014  . Sciatica 01/10/2012  . Thoracic or lumbosacral neuritis or radiculitis, unspecified 12/15/2011  . Ulnar neuropathy at elbow 11/14/2011  . Disturbance of skin sensation 11/14/2011    Dorene Ar, PTA 08/12/2015, 10:28 AM  [place], [name], [address] Phone: [phone]   Fax:  [phone]  Name: Keefe Zawistowski MRN: [id number] Date of Birth: 1965/11/02

## 2015-08-14 ENCOUNTER — Encounter: Payer: Medicare Other | Admitting: Physical Therapy

## 2015-08-18 ENCOUNTER — Encounter: Payer: Medicare Other | Admitting: Physical Therapy

## 2015-08-21 ENCOUNTER — Ambulatory Visit: Payer: Medicare Other | Admitting: Physical Therapy

## 2015-08-21 DIAGNOSIS — M6281 Muscle weakness (generalized): Secondary | ICD-10-CM

## 2015-08-21 DIAGNOSIS — M5416 Radiculopathy, lumbar region: Secondary | ICD-10-CM

## 2015-08-21 DIAGNOSIS — M2560 Stiffness of unspecified joint, not elsewhere classified: Secondary | ICD-10-CM

## 2015-08-21 NOTE — Therapy (Signed)
[place], [name], [address] Phone: [phone]   Fax:  [phone]  Physical Therapy Treatment/Recertification  Patient Details  Name: Glenn Robertson MRN: [id number] Date of Birth: 1966/05/09 Referring Provider: Dr. Alysia Penna  Encounter Date: 08/21/2015      PT End of Session - 08/21/15 0750    Visit Number 12   Number of Visits 16   Date for PT Re-Evaluation 09/11/15   Authorization Type Medicare G codes; 10th visit prog note; Kx visit 15   PT Start Time 0655   PT Stop Time 0740   PT Time Calculation (min) 45 min   Activity Tolerance Patient tolerated treatment well      Past Medical History  Diagnosis Date  . Diabetes mellitus   . Neuromuscular disorder ([place])   . Allergy   . Asthma   . Chronic pain   . Depression     Past Surgical History  Procedure Laterality Date  . Arm surgery  1/12    rt ulnar nerve decompression  . Epidural block injection      several  . Ulnar nerve transposition  01/19/2012    Procedure: ULNAR NERVE DECOMPRESSION/TRANSPOSITION;  Surgeon: Cammie Sickle., MD;  Location: [facility];  Service: Orthopedics;  Laterality: Left;  ulnar nerve decompression vs transposition left elbow    There were no vitals filed for this visit.  Visit Diagnosis:  Lumbar radiculitis - Plan: PT plan of care cert/re-cert  Joint stiffness of spine - Plan: PT plan of care cert/re-cert  Muscle weakness - Plan: PT plan of care cert/re-cert      Subjective Assessment - 08/21/15 0700    Subjective I've been on the road all week ([place]).  Didn't have time to exercise.  My back did surprisingly well.  Denies extra discomfort after last visit.  I don't know if I will need surgery or not.  Sees the surgeon in early January.  I don't remember the exercises.     Currently in Pain? No/denies   Pain Location Back   Pain Onset More than a month ago   Aggravating Factors  sometimes getting in/out of the car; lifting stuff to clean out my garage; lifting   Pain Relieving Factors avoiding lifting and aggravating factors            [facility] PT Assessment - 08/21/15 0712    AROM   Lumbar Flexion 70  can touch toes   Lumbar Extension 22   Strength   Right/Left Hip Right;Left   Right Hip ABduction 5/5   Left Hip ABduction 5/5   Lumbar Flexion 4/5   Lumbar Extension 4+/5                     [facility] Adult PT Treatment/Exercise - 08/21/15 0711    Lumbar Exercises: Supine   Bridge 20 reps   Bridge Limitations with feet on ball, h/s curls and bridge with feet on ball   Straight Leg Raise 10 reps   Straight Leg Raises Limitations with ab set small arc so no pain   Other Supine Lumbar Exercises small arc crunches minimal 20x   Lumbar Exercises: Sidelying   Other Sidelying Lumbar Exercises side planks each side 45 sec each   Lumbar Exercises: Prone   Other Prone Lumbar Exercises plank with leg stack 3x each 10 sec holds   Other Prone Lumbar Exercises prone over ball:  leg raise, hip abd,  UE/LE raise 10x each;  walk outs 10x   Knee/Hip Exercises: Aerobic   Stationary Bike Nu-Step L5 9 min   Knee/Hip Exercises: Machines for Strengthening   Cybex Leg Press 100# B 20x;R/L 60# 20x each                  PT Short Term Goals - 08/21/15 0755    PT SHORT TERM GOAL #1   Title Patient will be able to perform /resume low to moderate level neural mobliity and core strengthening without exacerbation of pain.     Status Achieved   PT SHORT TERM GOAL #2   Title Patient will have a 25% improvement in pain and function.   Status Achieved           PT Long Term Goals - 08/21/15 0706    PT LONG TERM GOAL #1   Title Independent with safe self progression of HEP for further ROM and strengthening  08/21/15   Time 8   Period Weeks   Status On-going   PT LONG TERM GOAL #2   Title Left hip abduction strength  improved to 4/5  needed for prolonged standing and walking   Status Achieved   PT LONG TERM GOAL #3   Title Core muscle strength improved to 4+/5 needed for prolonged walking and standing tolerance   Time 8   Period Weeks   Status Partially Met   PT LONG TERM GOAL #4   Title Oswestry Index improved to minimal self perceived disabilty 20% or less   Status Achieved   PT LONG TERM GOAL #5   Title Lumbar AROM flexion and extension improved to 70 degrees and 30 degrees needed for greater mobility with transitional movements   Time 8   Period Weeks   Status Partially Met   Additional Long Term Goals   Additional Long Term Goals Yes   PT LONG TERM GOAL #6   Title Patient will have core and LE strength for lifting light to medium weight object 10# for household chores   Time 3   Period Weeks   Status New               Plan - 08/21/15 0750    Clinical Impression Statement The patient has progressed well with PT with improved lumbar ROM, core strength and LE strength.  He is able to participate in moderate intensity ex without exacerbating or producing LBP.   He still needs verbal cues for abdominal brace for lumbar stabilization and to avoid holding his breath which increases disc pressure.  He would benefit from 2-3 more weeks of PT to promote full and safe independence with HEP and gym program as his symptoms have been easily exacerbated in the past.   He will see the surgeon on 09/02/15 for follow up.     Pt will benefit from skilled therapeutic intervention in order to improve on the following deficits Pain;Impaired flexibility;Decreased strength;Difficulty walking;Decreased activity tolerance;Decreased range of motion   PT Frequency 2x / week   PT Duration 3 weeks   PT Treatment/Interventions ADLs/Self Care Home Management;Electrical Stimulation;Cryotherapy;Moist Heat;Therapeutic exercise;Patient/family education;Manual techniques;Taping;Dry needling   PT Next Visit Plan ;Continue neutral spine  stabilization in supine, quadruped, prone and standing and machines;  Low level lifting 5-10# from various heights with body mechanics training for new rehab goal added today        Problem List Patient Active Problem List   Diagnosis Date Noted  . Left lumbar radiculitis 04/21/2015  .  HNP (herniated nucleus pulposus), lumbar 04/21/2015  . Chondromalacia of both patellae 02/24/2015  . Paranoid schizophrenia, chronic condition ([place]) 02/24/2015  . Chronic low back pain 07/18/2014  . Knee pain, acute 07/18/2014  . Right ankle pain 01/27/2014  . Great toe pain 01/27/2014  . Sciatica 01/10/2012  . Thoracic or lumbosacral neuritis or radiculitis, unspecified 12/15/2011  . Ulnar neuropathy at elbow 11/14/2011  . Disturbance of skin sensation 11/14/2011    Alvera Singh 08/21/2015, 7:57 AM  [facility] [address] [place], [name], [address] Phone: [phone]   Fax:  [phone]  Name: Kendrell Lottman MRN: [id number] Date of Birth: 1965/12/14   Ruben Im, PT 08/21/2015 7:58 AM Phone: [phone] Fax: [phone]

## 2015-08-25 ENCOUNTER — Ambulatory Visit: Payer: Medicare Other | Admitting: Physical Therapy

## 2015-08-25 DIAGNOSIS — M6281 Muscle weakness (generalized): Secondary | ICD-10-CM

## 2015-08-25 DIAGNOSIS — M5416 Radiculopathy, lumbar region: Secondary | ICD-10-CM | POA: Diagnosis not present

## 2015-08-25 DIAGNOSIS — M7651 Patellar tendinitis, right knee: Secondary | ICD-10-CM

## 2015-08-25 DIAGNOSIS — M659 Synovitis and tenosynovitis, unspecified: Secondary | ICD-10-CM

## 2015-08-25 DIAGNOSIS — M2560 Stiffness of unspecified joint, not elsewhere classified: Secondary | ICD-10-CM

## 2015-08-25 NOTE — Therapy (Signed)
[facility] [address] [place], [place], [address] Phone: [phone]   Fax:  [phone]  Physical Therapy Treatment  Patient Details  Name: Glenn Robertson MRN: [id number] Date of Birth: 1965/11/26 Referring Provider: Dr. Claudette Laws  Encounter Date: 08/25/2015      PT End of Session - 08/25/15 0908    Visit Number 13   Number of Visits 16   Date for PT Re-Evaluation 09/11/15   Authorization Type Medicare G codes; 10th visit prog note; Kx visit 15   PT Start Time 0845   PT Stop Time 0930   PT Time Calculation (min) 45 min      Past Medical History  Diagnosis Date  . Diabetes mellitus   . Neuromuscular disorder ([facility])   . Allergy   . Asthma   . Chronic pain   . Depression     Past Surgical History  Procedure Laterality Date  . Arm surgery  1/12    rt ulnar nerve decompression  . Epidural block injection      several  . Ulnar nerve transposition  01/19/2012    Procedure: ULNAR NERVE DECOMPRESSION/TRANSPOSITION;  Surgeon: Wyn Forster., MD;  Location: [facility];  Service: Orthopedics;  Laterality: Left;  ulnar nerve decompression vs transposition left elbow    There were no vitals filed for this visit.  Visit Diagnosis:  Lumbar radiculitis  Muscle weakness  Patellar tendonitis of right knee  Tenosynovitis, wrist  Joint stiffness of spine      Subjective Assessment - 08/25/15 0907    Subjective  No pain after last visit. I forgot the exercises   Currently in Pain? No/denies                         [facility] Adult PT Treatment/Exercise - 08/25/15 0910    Lumbar Exercises: Aerobic   Stationary Bike Nustep L5 x 6 min UE/LE    Lumbar Exercises: Standing   Functional Squats 20 reps   Functional Squats Limitations against physioball on wall   Row 20 reps;Theraband   Theraband Level (Row) Level 3 (Green)   Shoulder Extension Strengthening;20 reps;Theraband   Theraband Level (Shoulder Extension) Level 3 (Green)   Lumbar Exercises: Supine   Other Supine Lumbar Exercises small arc crunches minimal 20x   Lumbar Exercises: Sidelying   Other Sidelying Lumbar Exercises side planks each side 45 sec each   Lumbar Exercises: Prone   Other Prone Lumbar Exercises plank with leg stack 3x each 10 sec holds   Other Prone Lumbar Exercises prone over ball:  leg raise, hip abd, UE/LE raise 10x each;  walk outs 10x   Knee/Hip Exercises: Machines for Strengthening   Cybex Leg Press 40# x 10 60# x 20 80# x 20 100# x 20 , single leg 60# each 2 x 100                  PT Short Term Goals - 08/21/15 0755    PT SHORT TERM GOAL #1   Title Patient will be able to perform /resume low to moderate level neural mobliity and core strengthening without exacerbation of pain.     Status Achieved   PT SHORT TERM GOAL #2   Title Patient will have a 25% improvement in pain and function.   Status Achieved           PT Long Term Goals - 08/21/15 0706    PT LONG  TERM GOAL #1   Title Independent with safe self progression of HEP for further ROM and strengthening  08/21/15   Time 8   Period Weeks   Status On-going   PT LONG TERM GOAL #2   Title Left hip abduction strength  improved to 4/5 needed for prolonged standing and walking   Status Achieved   PT LONG TERM GOAL #3   Title Core muscle strength improved to 4+/5 needed for prolonged walking and standing tolerance   Time 8   Period Weeks   Status Partially Met   PT LONG TERM GOAL #4   Title Oswestry Index improved to minimal self perceived disabilty 20% or less   Status Achieved   PT LONG TERM GOAL #5   Title Lumbar AROM flexion and extension improved to 70 degrees and 30 degrees needed for greater mobility with transitional movements   Time 8   Period Weeks   Status Partially Met   Additional Long Term Goals   Additional Long Term Goals Yes   PT LONG TERM GOAL #6   Title Patient will have core and LE  strength for lifting light to medium weight object 10# for household chores   Time 3   Period Weeks   Status New               Plan - 08/25/15 0941    Clinical Impression Statement Continued Moderate to advanced core strengthening as performed last vist with no increased pain. Pt has dificulty remembering exercises from previous visits and admits to minimal HEP due to the holidays and he reports he forgets to do the hip machines at the gym. Improving motor control with exercises noted and no increased pain per patient.    PT Next Visit Plan  ;Continue neutral spine stabilization in supine, quadruped, prone and standing and machines;  Low level lifting 5-10# from various heights with body mechanics training for new rehab goal added today        Problem List Patient Active Problem List   Diagnosis Date Noted  . Left lumbar radiculitis 04/21/2015  . HNP (herniated nucleus pulposus), lumbar 04/21/2015  . Chondromalacia of both patellae 02/24/2015  . Paranoid schizophrenia, chronic condition ([place]) 02/24/2015  . Chronic low back pain 07/18/2014  . Knee pain, acute 07/18/2014  . Right ankle pain 01/27/2014  . Great toe pain 01/27/2014  . Sciatica 01/10/2012  . Thoracic or lumbosacral neuritis or radiculitis, unspecified 12/15/2011  . Ulnar neuropathy at elbow 11/14/2011  . Disturbance of skin sensation 11/14/2011    Dorene Ar, PTA 08/25/2015, 9:44 AM  [facility] [address] [place], [name], [address] Phone: [phone]   Fax:  [phone]  Name: Kenneth Cuaresma MRN: [id number] Date of Birth: 1966-08-02

## 2015-08-27 ENCOUNTER — Ambulatory Visit: Payer: Medicare Other | Admitting: Physical Therapy

## 2015-08-27 DIAGNOSIS — M5416 Radiculopathy, lumbar region: Secondary | ICD-10-CM

## 2015-08-27 DIAGNOSIS — M6281 Muscle weakness (generalized): Secondary | ICD-10-CM

## 2015-08-27 NOTE — Therapy (Signed)
[place], [name], [address] Phone: [phone]   Fax:  [phone]  Physical Therapy Treatment  Patient Details  Name: Glenn Robertson MRN: [id number] Date of Birth: 10-Aug-1966 Referring Provider: Dr. Alysia Penna  Encounter Date: 08/27/2015      PT End of Session - 08/27/15 0804    Visit Number 14   Number of Visits 16   Date for PT Re-Evaluation 09/11/15   Authorization Type Medicare G codes; 10th visit prog note; Kx visit 15   PT Start Time 0753   PT Stop Time 0838   PT Time Calculation (min) 45 min   Activity Tolerance Patient tolerated treatment well      Past Medical History  Diagnosis Date  . Diabetes mellitus   . Neuromuscular disorder ([place])   . Allergy   . Asthma   . Chronic pain   . Depression     Past Surgical History  Procedure Laterality Date  . Arm surgery  1/12    rt ulnar nerve decompression  . Epidural block injection      several  . Ulnar nerve transposition  01/19/2012    Procedure: ULNAR NERVE DECOMPRESSION/TRANSPOSITION;  Surgeon: Cammie Sickle., MD;  Location: [facility];  Service: Orthopedics;  Laterality: Left;  ulnar nerve decompression vs transposition left elbow    There were no vitals filed for this visit.  Visit Diagnosis:  Lumbar radiculitis  Muscle weakness      Subjective Assessment - 08/27/15 0754    Subjective I still have numbness in my left foot but otherwise doing "fine."  Sees surgeon 1/4.  Denies soreness or increased pain after last visit.  "I want to go for a bike ride this afternoon."   Currently in Pain? No/denies   Pain Location Back                         [facility] Adult PT Treatment/Exercise - 08/27/15 0757    Lumbar Exercises: Aerobic   Stationary Bike Nu-Step L5 7 min   Lumbar Exercises: Standing   Row Strengthening;Power tower;Both;20 reps   Theraband Level (Row) --  20# B 20x   Other  Standing Lumbar Exercises 7# box lift from waist to chest 10x; waist to knee level 10x   Other Standing Lumbar Exercises ab brace with blue band 15x pull downs straight and diagonals    Lumbar Exercises: Prone   Other Prone Lumbar Exercises plank with leg stack 3x each 10 sec holds   Other Prone Lumbar Exercises prone over ball:  leg raise, hip abd, UE/LE raise 10x each;  walk outs 10x   Knee/Hip Exercises: Machines for Strengthening   Hip Cybex 30# B hip ext and hip abd 25x each                  PT Short Term Goals - 08/27/15 5102    PT SHORT TERM GOAL #1   Title Patient will be able to perform /resume low to moderate level neural mobliity and core strengthening without exacerbation of pain.     Status Achieved   PT SHORT TERM GOAL #2   Title Patient will have a 25% improvement in pain and function.   Status Achieved           PT Long Term Goals - 08/27/15 0836    PT LONG TERM GOAL #1   Title Independent with safe self progression of HEP  for further ROM and strengthening  08/21/15   Time 8   Period Weeks   Status On-going   PT LONG TERM GOAL #2   Title Left hip abduction strength  improved to 4/5 needed for prolonged standing and walking   Status Achieved   PT LONG TERM GOAL #3   Title Core muscle strength improved to 4+/5 needed for prolonged walking and standing tolerance   Time 8   Period Weeks   Status Partially Met   PT LONG TERM GOAL #4   Title Oswestry Index improved to minimal self perceived disabilty 20% or less   Status Achieved   PT LONG TERM GOAL #5   Title Lumbar AROM flexion and extension improved to 70 degrees and 30 degrees needed for greater mobility with transitional movements   Time 8   Period Weeks   Status Partially Met   PT LONG TERM GOAL #6   Title Patient will have core and LE strength for lifting light to medium weight object 10# for household chores   Time 3   Period Weeks   Status On-going               Plan - 08/27/15  0806    Clinical Impression Statement Good technique with 7# lifting from waist to chest and knees to waist without pain.   He is able to do moderate intensity core and lower extremity stabilization/strengthening without pain exacerbation.  Therapist closely monitoring all.     PT Next Visit Plan Kx add next visit;  see how appt went with surgeon on 1/4.  continue LE and core strengthening;  10# box lifting; should meet remaining goals in 3-4 visits        Problem List Patient Active Problem List   Diagnosis Date Noted  . Left lumbar radiculitis 04/21/2015  . HNP (herniated nucleus pulposus), lumbar 04/21/2015  . Chondromalacia of both patellae 02/24/2015  . Paranoid schizophrenia, chronic condition ([place]) 02/24/2015  . Chronic low back pain 07/18/2014  . Knee pain, acute 07/18/2014  . Right ankle pain 01/27/2014  . Great toe pain 01/27/2014  . Sciatica 01/10/2012  . Thoracic or lumbosacral neuritis or radiculitis, unspecified 12/15/2011  . Ulnar neuropathy at elbow 11/14/2011  . Disturbance of skin sensation 11/14/2011    Alvera Singh 08/27/2015, 8:38 AM  [facility] [address] [place], [name], [address] Phone: [phone]   Fax:  [phone]  Name: Glenn Robertson MRN: [id number] Date of Birth: 08-09-1966    Ruben Im, PT 08/27/2015 8:38 AM Phone: [phone] Fax: [phone]

## 2015-09-02 ENCOUNTER — Ambulatory Visit: Payer: Medicare Other | Attending: Physical Medicine & Rehabilitation | Admitting: Physical Therapy

## 2015-09-02 DIAGNOSIS — M6281 Muscle weakness (generalized): Secondary | ICD-10-CM | POA: Diagnosis present

## 2015-09-02 DIAGNOSIS — M7651 Patellar tendinitis, right knee: Secondary | ICD-10-CM

## 2015-09-02 DIAGNOSIS — M5416 Radiculopathy, lumbar region: Secondary | ICD-10-CM | POA: Diagnosis present

## 2015-09-02 DIAGNOSIS — M6588 Other synovitis and tenosynovitis, other site: Secondary | ICD-10-CM | POA: Insufficient documentation

## 2015-09-02 DIAGNOSIS — M2560 Stiffness of unspecified joint, not elsewhere classified: Secondary | ICD-10-CM | POA: Diagnosis present

## 2015-09-02 DIAGNOSIS — M659 Synovitis and tenosynovitis, unspecified: Secondary | ICD-10-CM

## 2015-09-02 NOTE — Therapy (Signed)
[place], [name], [address] Phone: [phone]   Fax:  [phone]  Physical Therapy Treatment  Patient Details  Name: Glenn Robertson MRN: [id number] Date of Birth: 1966-07-30 Referring Provider: Dr. Alysia Penna  Encounter Date: 09/02/2015      PT End of Session - 09/02/15 1417    Visit Number 15   Number of Visits 16   Date for PT Re-Evaluation 09/11/15   Authorization Type Medicare G codes; 10th visit prog note; Kx visit 15   PT Start Time 0215   PT Stop Time 0300   PT Time Calculation (min) 45 min      Past Medical History  Diagnosis Date  . Diabetes mellitus   . Neuromuscular disorder ([place])   . Allergy   . Asthma   . Chronic pain   . Depression     Past Surgical History  Procedure Laterality Date  . Arm surgery  1/12    rt ulnar nerve decompression  . Epidural block injection      several  . Ulnar nerve transposition  01/19/2012    Procedure: ULNAR NERVE DECOMPRESSION/TRANSPOSITION;  Surgeon: Cammie Sickle., MD;  Location: [facility];  Service: Orthopedics;  Laterality: Left;  ulnar nerve decompression vs transposition left elbow    There were no vitals filed for this visit.  Visit Diagnosis:  Lumbar radiculitis  Muscle weakness  Patellar tendonitis of right knee  Tenosynovitis, wrist  Joint stiffness of spine      Subjective Assessment - 09/02/15 1501    Subjective Surgeon did not recommend surgery. I still have more questions for him. He said I did not have any leg weakness so surgery would not be covered. I did have a little pain after ging to the gym last time but the only thing new was heel raises.    Currently in Pain? No/denies            [facility] PT Assessment - 09/02/15 0001    AROM   Lumbar Flexion 70  can touch toes   Lumbar Extension 30   Strength   Lumbar Flexion 4/5   Lumbar Extension 4+/5                     [facility]  Adult PT Treatment/Exercise - 09/02/15 1422    Lumbar Exercises: Standing   Functional Squats 20 reps   Functional Squats Limitations lifting 10# wt from mat.    Lumbar Exercises: Supine   Bent Knee Raise Limitations dead bug 20 sec x 2   Other Supine Lumbar Exercises small arc crunches minimal 20x, push ups x 5 with good form   Other Supine Lumbar Exercises small arc crunches in table top 10 x 2   Lumbar Exercises: Prone   Other Prone Lumbar Exercises plank 90 seconds   Knee/Hip Exercises: Aerobic   Stationary Bike Rec Bike L3 x 7 min   Knee/Hip Exercises: Machines for Strengthening   Hip Cybex 30# B hip ext and hip abd 25x each, also flexion and adduction                  PT Short Term Goals - 08/27/15 0836    PT SHORT TERM GOAL #1   Title Patient will be able to perform /resume low to moderate level neural mobliity and core strengthening without exacerbation of pain.     Status Achieved   PT SHORT TERM GOAL #2  Title Patient will have a 25% improvement in pain and function.   Status Achieved           PT Long Term Goals - 09/02/15 1443    PT LONG TERM GOAL #1   Title Independent with safe self progression of HEP for further ROM and strengthening  08/21/15   Time 8   Period Weeks   Status On-going   PT LONG TERM GOAL #2   Title Left hip abduction strength  improved to 4/5 needed for prolonged standing and walking   Time 8   Period Weeks   Status Achieved   PT LONG TERM GOAL #3   Title Core muscle strength improved to 4+/5 needed for prolonged walking and standing tolerance   Time 8   Period Weeks   Status Partially Met   PT LONG TERM GOAL #4   Title Oswestry Index improved to minimal self perceived disabilty 20% or less   Time 8   Period Weeks   Status Achieved   PT LONG TERM GOAL #5   Title Lumbar AROM flexion and extension improved to 70 degrees and 30 degrees needed for greater mobility with transitional movements   Time 8   Period Weeks   Status  Achieved   PT LONG TERM GOAL #6   Title Patient will have core and LE strength for lifting light to medium weight object 10# for household chores   Time 3   Period Weeks   Status Achieved               Plan - 09/02/15 1459    Clinical Impression Statement Good technique with lifting 10# from knees to chest during functinal squats without pain. Pt able to plank for 90 seconds with good form and decreased motor control. His lumbar AROM has improved to 70 flexion and 30 extension. LTG# 5,#6 MET.    PT Next Visit Plan continue kx modifier; recheck lumbar flexion strength for completion of goals; possible DC next visit    continue LE and core strengthening;  10# box lifting; should meet remaining goals in 3-4 visits        Problem List Patient Active Problem List   Diagnosis Date Noted  . Left lumbar radiculitis 04/21/2015  . HNP (herniated nucleus pulposus), lumbar 04/21/2015  . Chondromalacia of both patellae 02/24/2015  . Paranoid schizophrenia, chronic condition ([place]) 02/24/2015  . Chronic low back pain 07/18/2014  . Knee pain, acute 07/18/2014  . Right ankle pain 01/27/2014  . Great toe pain 01/27/2014  . Sciatica 01/10/2012  . Thoracic or lumbosacral neuritis or radiculitis, unspecified 12/15/2011  . Ulnar neuropathy at elbow 11/14/2011  . Disturbance of skin sensation 11/14/2011    Dorene Ar, PTA 09/02/2015, 3:04 PM  [facility] [address] [place], [name], [address] Phone: [phone]   Fax:  [phone]  Name: Glenn Robertson MRN: [id number] Date of Birth: April 20, 1966

## 2015-09-04 ENCOUNTER — Ambulatory Visit: Payer: Medicare Other | Admitting: Physical Therapy

## 2015-09-04 DIAGNOSIS — M5416 Radiculopathy, lumbar region: Secondary | ICD-10-CM | POA: Diagnosis not present

## 2015-09-04 DIAGNOSIS — M6281 Muscle weakness (generalized): Secondary | ICD-10-CM

## 2015-09-04 NOTE — Therapy (Signed)
[place], [name], [address] Phone: [phone]   Fax:  [phone]  Physical Therapy Treatment/Discharge Summary  Patient Details  Name: Glenn Robertson MRN: [id number] Date of Birth: 1965-11-22 Referring Provider: Dr. Alysia Penna  Encounter Date: 09/04/2015      PT End of Session - 09/04/15 0912    Visit Number 16   Number of Visits 16   Date for PT Re-Evaluation 09/11/15   Authorization Type Medicare G codes; 10th visit prog note; Kx visit 15   PT Start Time 0846   PT Stop Time 0925   PT Time Calculation (min) 39 min   Activity Tolerance Patient tolerated treatment well      Past Medical History  Diagnosis Date  . Diabetes mellitus   . Neuromuscular disorder ([place])   . Allergy   . Asthma   . Chronic pain   . Depression     Past Surgical History  Procedure Laterality Date  . Arm surgery  1/12    rt ulnar nerve decompression  . Epidural block injection      several  . Ulnar nerve transposition  01/19/2012    Procedure: ULNAR NERVE DECOMPRESSION/TRANSPOSITION;  Surgeon: Cammie Sickle., MD;  Location: [facility];  Service: Orthopedics;  Laterality: Left;  ulnar nerve decompression vs transposition left elbow    There were no vitals filed for this visit.  Visit Diagnosis:  Lumbar radiculitis  Muscle weakness      Subjective Assessment - 09/04/15 0853    Subjective The patient states his doctor's appt "didn't go well."  "He said I don't need surgery because my leg is not weak.  I don't know what I'm going to do about this numbness."  "I tried to walk but it flares up my arthritis in my knee.  I don't know what the doctor thinks I'm supposed to do."  "I'm going to call him about riding my bike but I'm going to do it regardless of what he says."     Currently in Pain? Yes   Pain Score 2    Pain Location Thoracic   Pain Orientation Upper            [facility] PT  Assessment - 09/04/15 0001    AROM   Lumbar Flexion 70  can touch toes   Lumbar Extension 30   Strength   Right Hip ABduction 5/5   Left Hip ABduction 5/5   Lumbar Flexion 4+/5   Lumbar Extension 4+/5                     [facility] Adult PT Treatment/Exercise - 09/04/15 0858    Lumbar Exercises: Supine   Other Supine Lumbar Exercises ab brace with single leg lower 10x r/L   Lumbar Exercises: Prone   Other Prone Lumbar Exercises prone lumbar multifidi press with HS curls, hip ext with bent and straight knee 10x each   Other Prone Lumbar Exercises push ups (patient request)    Lumbar Exercises: Quadruped   Opposite Arm/Leg Raise 10 reps   Knee/Hip Exercises: Aerobic   Stationary Bike Rec Bike L3 x 7 min   Knee/Hip Exercises: Machines for Strengthening   Cybex Leg Press 120# 20x B                  PT Short Term Goals - 09/04/15 0901    PT SHORT TERM GOAL #1   Title Patient will be able  to perform /resume low to moderate level neural mobliity and core strengthening without exacerbation of pain.     Status Achieved   PT SHORT TERM GOAL #2   Title Patient will have a 25% improvement in pain and function.   Status Achieved           PT Long Term Goals - Sep 05, 2015 0901    PT LONG TERM GOAL #1   Title Independent with safe self progression of HEP for further ROM and strengthening  08/21/15   Status Achieved   PT LONG TERM GOAL #2   Title Left hip abduction strength  improved to 4/5 needed for prolonged standing and walking   Status Achieved   PT LONG TERM GOAL #3   Title Core muscle strength improved to 4+/5 needed for prolonged walking and standing tolerance   Status Achieved   PT LONG TERM GOAL #4   Title Oswestry Index improved to minimal self perceived disabilty 20% or less   Status Achieved   PT LONG TERM GOAL #5   Title Lumbar AROM flexion and extension improved to 70 degrees and 30 degrees needed for greater mobility with transitional movements    Status Achieved   PT LONG TERM GOAL #6   Title Patient will have core and LE strength for lifting light to medium weight object 10# for household chores   Status Achieved               Plan - September 05, 2015 1009    Clinical Impression Statement The patient is able to participate in intermediate to advanced exercises for core and LE strengthening without exacerbation of pain.  His pain level has been no pain to minimal pain for > 1 month.  The patient has met all rehab goals.  His lumbar AROM, strength is WNLs.  He is indepedent in a safe self progression of HEP.  Recommend discharge from PT at this time.            G-Codes - 09-05-2015 1148    Functional Assessment Tool Used clinical judgement   Functional Limitation Mobility: Walking and moving around   Mobility: Walking and Moving Around Goal Status [id number]) At least 20 percent but less than 40 percent impaired, limited or restricted   Mobility: Walking and Moving Around Discharge Status [id number]) At least 20 percent but less than 40 percent impaired, limited or restricted     PHYSICAL THERAPY DISCHARGE SUMMARY  Visits from Start of Care: 16  Current functional level related to goals / functional outcomes: See clinical impressions above.  All STGS, LTGS met.     Remaining deficits: As above   Education / Equipment: Comprehensive HEP Plan: Patient agrees to discharge.  Patient goals were met. Patient is being discharged due to meeting the stated rehab goals.  ?????       Problem List Patient Active Problem List   Diagnosis Date Noted  . Left lumbar radiculitis 04/21/2015  . HNP (herniated nucleus pulposus), lumbar 04/21/2015  . Chondromalacia of both patellae 02/24/2015  . Paranoid schizophrenia, chronic condition ([place]) 02/24/2015  . Chronic low back pain 07/18/2014  . Knee pain, acute 07/18/2014  . Right ankle pain 01/27/2014  . Great toe pain 01/27/2014  . Sciatica 01/10/2012  . Thoracic or lumbosacral neuritis or  radiculitis, unspecified 12/15/2011  . Ulnar neuropathy at elbow 11/14/2011  . Disturbance of skin sensation 11/14/2011   Ruben Im, PT 2015/09/05 11:52 AM Phone: [phone] Fax: [phone] Alvera Singh September 05, 2015, 11:49 AM  [place], [name], [address] Phone: [phone]   Fax:  [phone]  Name: Glenn Robertson MRN: [id number] Date of Birth: [birth year]

## 2015-09-07 ENCOUNTER — Encounter: Payer: Medicare Other | Admitting: Physical Therapy

## 2015-09-09 ENCOUNTER — Encounter: Payer: Medicare Other | Admitting: Physical Therapy

## 2015-10-01 ENCOUNTER — Other Ambulatory Visit: Payer: Self-pay | Admitting: Physical Medicine & Rehabilitation

## 2015-10-26 ENCOUNTER — Ambulatory Visit (HOSPITAL_BASED_OUTPATIENT_CLINIC_OR_DEPARTMENT_OTHER): Payer: Medicare Other | Admitting: Physical Medicine & Rehabilitation

## 2015-10-26 ENCOUNTER — Encounter: Payer: Self-pay | Admitting: Physical Medicine & Rehabilitation

## 2015-10-26 ENCOUNTER — Encounter: Payer: Medicare Other | Attending: Physical Medicine & Rehabilitation

## 2015-10-26 VITALS — BP 121/70 | HR 69 | Resp 14

## 2015-10-26 DIAGNOSIS — G8929 Other chronic pain: Secondary | ICD-10-CM | POA: Insufficient documentation

## 2015-10-26 DIAGNOSIS — M545 Low back pain: Secondary | ICD-10-CM | POA: Diagnosis present

## 2015-10-26 DIAGNOSIS — M7542 Impingement syndrome of left shoulder: Secondary | ICD-10-CM

## 2015-10-26 DIAGNOSIS — M25561 Pain in right knee: Secondary | ICD-10-CM | POA: Diagnosis not present

## 2015-10-26 DIAGNOSIS — Z8669 Personal history of other diseases of the nervous system and sense organs: Secondary | ICD-10-CM | POA: Diagnosis not present

## 2015-10-26 MED ORDER — MELOXICAM 7.5 MG PO TABS: 7.5000 mg | ORAL_TABLET | Freq: Every day | ORAL | Status: DC

## 2015-10-26 NOTE — Progress Notes (Signed)
Subjective:    Patient ID: Glenn Robertson, male    DOB: 1966-02-15, 50 y.o.   MRN: [id number]  HPI No sciatic pain Has Low back pain with prolonged bike rides needs a recovery day after rides Havin pain in left deltoid area problem with certain machines ,, Overhead and abduction exercises  cause left greater than right shoulder pain, Pain is relieved by avoiding these activities No significant neck pain. No numbness or tingling in the hand or arm area. No falls or other recent injuries.  No prior history of shoulder surgery.  Pain Inventory Average Pain 0 Pain Right Now 0 My pain is sharp  In the last 24 hours, has pain interfered with the following? General activity 3 Relation with others 1 Enjoyment of life 6 What TIME of day is your pain at its worst? all Sleep (in general) Good  Pain is worse with: some activites Pain improves with: rest Relief from Meds: no selection  Mobility Do you have any goals in this area?  no  Function Do you have any goals in this area?  no  Neuro/Psych No problems in this area  Prior Studies Any changes since last visit?  no  Physicians involved in your care Any changes since last visit?  no   Family History  Problem Relation Age of Onset  . Diabetes Father   . Diabetes Paternal Uncle    Social History   Social History  . Marital Status: Single    Spouse Name: N/A  . Number of Children: N/A  . Years of Education: N/A   Social History Main Topics  . Smoking status: Never Smoker   . Smokeless tobacco: Never Used  . Alcohol Use: No  . Drug Use: No  . Sexual Activity: Not Asked   Other Topics Concern  . None   Social History Narrative   Past Surgical History  Procedure Laterality Date  . Arm surgery  1/12    rt ulnar nerve decompression  . Epidural block injection      several  . Ulnar nerve transposition  01/19/2012    Procedure: ULNAR NERVE DECOMPRESSION/TRANSPOSITION;  Surgeon: Cammie Sickle., MD;   Location: [facility];  Service: Orthopedics;  Laterality: Left;  ulnar nerve decompression vs transposition left elbow   Past Medical History  Diagnosis Date  . Diabetes mellitus   . Neuromuscular disorder ([place])   . Allergy   . Asthma   . Chronic pain   . Depression    BP 121/70 mmHg  Pulse 69  Resp 14  SpO2 99%  Opioid Risk Score:   Fall Risk Score:  `1  Depression screen PHQ 2/9  Depression screen [facility] 2/9 04/21/2015 12/05/2014  Decreased Interest 2 2  Down, Depressed, Hopeless 3 3  PHQ - 2 Score 5 5  Altered sleeping - 0  Tired, decreased energy - 0  Change in appetite - 0  Feeling bad or failure about yourself  - 3  Trouble concentrating - 3  Moving slowly or fidgety/restless - 1  Suicidal thoughts - 1  PHQ-9 Score - 13      Review of Systems     Objective:   Physical Exam  Constitutional: He appears well-developed and well-nourished.  HENT:  Head: Normocephalic and atraumatic.  Eyes: Conjunctivae and EOM are normal. Pupils are equal, round, and reactive to light.  Neck: Normal range of motion.  Musculoskeletal:       Right shoulder: He exhibits normal range  of motion, no tenderness and no deformity.       Left shoulder: He exhibits pain. He exhibits normal range of motion, no tenderness and no deformity.       Cervical back: Normal.  Positive impingement sign at 80-85 at the left shoulder with the arm internally rotated. No pain over the acromioclavicular joint. No tenderness to palpation over the trapezius. No pain over the Bicipital groove    Nursing note and vitals reviewed.  Gait is normal Mood and affect are appropriate       Assessment & Plan:  1. Left subacromial impingement syndrome, he may have some underlying cuff issues however it seems like this is a newer problem and could be just a subacromial bursitis due to lack of strength in the scapular stabilizer muscles and attempting to do machines that work the deltoids in an  impingement range Have printed out and instructed patient in several scapular stabilizing exercises as well as stretching exercises. We discussed that she should not do any of his deltoid or pectoral machines in total his stabilizers are stronger, should wait at least 1 month .Will reorder Mobic If not much better in 1 month consider injection followed by physical therapy  Over half of the 25 min visit was spent counseling and coordinating care.We spent time going over a diagram as well as the mechanics of impingement. We discussed exercises in terms of reps as well as technique.

## 2015-10-26 NOTE — Patient Instructions (Signed)

## 2015-11-24 ENCOUNTER — Encounter: Payer: Self-pay | Admitting: Sports Medicine

## 2015-11-24 ENCOUNTER — Ambulatory Visit (INDEPENDENT_AMBULATORY_CARE_PROVIDER_SITE_OTHER): Payer: Medicare Other | Admitting: Sports Medicine

## 2015-11-24 VITALS — BP 110/76 | Ht 70.0 in | Wt 199.0 lb

## 2015-11-24 DIAGNOSIS — M2242 Chondromalacia patellae, left knee: Secondary | ICD-10-CM | POA: Diagnosis not present

## 2015-11-24 DIAGNOSIS — M2241 Chondromalacia patellae, right knee: Secondary | ICD-10-CM | POA: Diagnosis not present

## 2015-11-24 NOTE — Assessment & Plan Note (Signed)
He has done gym work with lots of quad strength Quad and HS strength looks good  Pain on RT may related to his lateral hip weakness  Gait is neutral  Start a HEP to emphasize hips and see if this resolves pain over next 2 mos

## 2015-11-24 NOTE — Progress Notes (Signed)
Patient ID: Glenn Robertson, male   DOB: 1966/03/03, 50 y.o.   MRN: [id number]  CC: RT knee pain  Patient wants to run 5 K Each time he has done a program he gets nearly to 5 K and usually has to stop More recnetly this has been anterior RT knee He saw Dr Sharol Given MRI showed mild chondromalacia on RT  Does exercises in gym including leg presses Better than before Still periodic sharp pain with running  Past HX Paranoid Schizophrenia in control (Referred by Windle Guard, MSW) HTN Significant Low Back pain and hx of herniated disk  ROS No locking No givning way No swelling slt pain in left knee  EXAM NAD/ sometimes repeats questions BP 110/76 mmHg  Ht 5\' 10"  (1.778 m)  Wt 199 lb (90.266 kg)  BMI 28.55 kg/m2  Knee/ RT: Normal to inspection with no erythema or effusion or obvious bony abnormalities. Palpation normal with no warmth or joint line tenderness or patellar tenderness or condyle tenderness. ROM normal in flexion and extension and lower leg rotation. Ligaments with solid consistent endpoints including ACL, PCL, LCL, MCL. Negative Mcmurray's and provocative meniscal tests. Non painful patellar compression but he does get some crepitation. Patellar and quadriceps tendons unremarkable. Hamstring and quadriceps strength is normal.  RT hip - glut medius weakness/ notes weakness with lateral step up

## 2015-11-26 ENCOUNTER — Ambulatory Visit: Payer: Medicare Other | Admitting: Physical Medicine & Rehabilitation

## 2015-12-03 ENCOUNTER — Other Ambulatory Visit: Payer: Self-pay | Admitting: Orthopaedic Surgery

## 2015-12-03 DIAGNOSIS — M545 Low back pain: Secondary | ICD-10-CM

## 2015-12-06 ENCOUNTER — Other Ambulatory Visit: Payer: Self-pay | Admitting: Physical Medicine & Rehabilitation

## 2015-12-06 ENCOUNTER — Ambulatory Visit
Admission: RE | Admit: 2015-12-06 | Discharge: 2015-12-06 | Disposition: A | Discharge status: Home / Self Care | Payer: Medicare Other | Source: Ambulatory Visit | Attending: Orthopaedic Surgery | Admitting: Orthopaedic Surgery

## 2015-12-06 DIAGNOSIS — M545 Low back pain: Secondary | ICD-10-CM

## 2015-12-06 NOTE — Telephone Encounter (Signed)
Please advise on refill? I do not see recent documentation on this.

## 2015-12-09 ENCOUNTER — Other Ambulatory Visit: Payer: Self-pay | Admitting: Physical Medicine & Rehabilitation

## 2015-12-18 ENCOUNTER — Encounter: Payer: Medicare Other | Attending: Physical Medicine & Rehabilitation

## 2015-12-18 ENCOUNTER — Encounter: Payer: Self-pay | Admitting: Physical Medicine & Rehabilitation

## 2015-12-18 ENCOUNTER — Ambulatory Visit (HOSPITAL_BASED_OUTPATIENT_CLINIC_OR_DEPARTMENT_OTHER): Payer: Medicare Other | Admitting: Physical Medicine & Rehabilitation

## 2015-12-18 VITALS — BP 100/73 | HR 82 | Resp 14

## 2015-12-18 DIAGNOSIS — G8929 Other chronic pain: Secondary | ICD-10-CM | POA: Diagnosis not present

## 2015-12-18 DIAGNOSIS — M25561 Pain in right knee: Secondary | ICD-10-CM | POA: Insufficient documentation

## 2015-12-18 DIAGNOSIS — M5136 Other intervertebral disc degeneration, lumbar region: Secondary | ICD-10-CM

## 2015-12-18 DIAGNOSIS — M7542 Impingement syndrome of left shoulder: Secondary | ICD-10-CM

## 2015-12-18 DIAGNOSIS — M5416 Radiculopathy, lumbar region: Secondary | ICD-10-CM | POA: Diagnosis not present

## 2015-12-18 DIAGNOSIS — M545 Low back pain: Secondary | ICD-10-CM | POA: Insufficient documentation

## 2015-12-18 NOTE — Patient Instructions (Signed)
Try jumping jacks laying on a mat on your stop 3 sets up to 25 reps

## 2015-12-18 NOTE — Progress Notes (Addendum)
Subjective:    Patient ID: Glenn Robertson, male    DOB: 07-04-1966, 50 y.o.   MRN: [id number]  HPI Patient is now seeing sports medicine. This is for his knee pain. . Patient continues to get left shoulder painWhen he lifts weights. Patient could not figure out how to do the theraband exercises. He denies doing any overhead lifting.  Patient also has back pain associated with riding his bike. Patient states that walking or running aggravates his knee pain. Patient is not a swimmer He is spoken to his orthopedic surgeon who recommended repeat spine injection. He is currently not having much radicular pain in the left lower extremity. He's had good response in the past to left S1 transforaminal injections, Last procedure in December 2016  Pain Inventory Average Pain 1 Pain Right Now 1 My pain is no selection  In the last 24 hours, has pain interfered with the following? General activity 3 Relation with others 0 Enjoyment of life 6 What TIME of day is your pain at its worst? all Sleep (in general) Good  Pain is worse with: sitting Pain improves with: rest Relief from Meds: 0  Mobility Do you have any goals in this area?  no  Function Do you have any goals in this area?  no  Neuro/Psych No problems in this area  Prior Studies Any changes since last visit?  no  Physicians involved in your care Any changes since last visit?  no   Family History  Problem Relation Age of Onset  . Diabetes Father   . Diabetes Paternal Uncle    Social History   Social History  . Marital Status: Single    Spouse Name: N/A  . Number of Children: N/A  . Years of Education: N/A   Social History Main Topics  . Smoking status: Never Smoker   . Smokeless tobacco: Never Used  . Alcohol Use: No  . Drug Use: No  . Sexual Activity: Not Asked   Other Topics Concern  . None   Social History Narrative   Past Surgical History  Procedure Laterality Date  . Arm surgery  1/12    rt ulnar  nerve decompression  . Epidural block injection      several  . Ulnar nerve transposition  01/19/2012    Procedure: ULNAR NERVE DECOMPRESSION/TRANSPOSITION;  Surgeon: Cammie Sickle., MD;  Location: [facility];  Service: Orthopedics;  Laterality: Left;  ulnar nerve decompression vs transposition left elbow   Past Medical History  Diagnosis Date  . Diabetes mellitus   . Neuromuscular disorder ([place])   . Allergy   . Asthma   . Chronic pain   . Depression    BP 100/73 mmHg  Pulse 82  Resp 14  SpO2 98%  Opioid Risk Score:   Fall Risk Score:  `1  Depression screen PHQ 2/9  Depression screen [facility] 2/9 11/24/2015 11/24/2015 04/21/2015 12/05/2014  Decreased Interest 0 0 2 2  Down, Depressed, Hopeless 0 0 3 3  PHQ - 2 Score 0 0 5 5  Altered sleeping - - - 0  Tired, decreased energy - - - 0  Change in appetite - - - 0  Feeling bad or failure about yourself  - - - 3  Trouble concentrating - - - 3  Moving slowly or fidgety/restless - - - 1  Suicidal thoughts - - - 1  PHQ-9 Score - - - 13     Review of Systems  All  other systems reviewed and are negative.      Objective:   Physical Exam  Constitutional: He is oriented to person, place, and time. He appears well-developed and well-nourished.  HENT:  Head: Normocephalic and atraumatic.  Eyes: Conjunctivae are normal. Pupils are equal, round, and reactive to light.  Neck: Normal range of motion.  Musculoskeletal:       Left shoulder: He exhibits normal range of motion.       Right hip: He exhibits normal range of motion.       Right knee: He exhibits normal range of motion.       Left knee: He exhibits normal range of motion.       Lumbar back: He exhibits pain. He exhibits normal range of motion, no tenderness and no spasm.  Negative impingement sign at the left shoulder There is pain with forward flexion of the lumbar spine he is able to touch his fingertips to the top of his patella. He has full lumbar  extension without pain  Neurological: He is alert and oriented to person, place, and time.  Negative straight leg raising test  No evidence of calf atrophy  Psychiatric: He has a normal mood and affect.  Nursing note and vitals reviewed.         Assessment & Plan:  1. L5-S1 disc protrusion. As expected he has more pain with sitting and bending forward. He does not experience much in terms of radicular discomfort at the current time. He's had good results with left S1 epidural injection and it has been greater than 4 months since last injection. We discussed that an S1 epidural may be partially effective for his complaint. He would like to schedule for this. He will need a driver. We have discussed different exercises that may be helpful for his disc protrusion, we discussed and demonstrated prone jumping jacks.  2. History of left subacromial impingement syndrome, currently not experiencing any symptoms since he has quit weight lifting. We have discussed that he needs to build up his scapular stabilizer muscles however he is now focusing on his right gluteus weakness. He expects to be going to therapy for this.

## 2015-12-21 ENCOUNTER — Telehealth: Payer: Self-pay | Admitting: *Deleted

## 2015-12-21 DIAGNOSIS — M25561 Pain in right knee: Secondary | ICD-10-CM

## 2015-12-21 DIAGNOSIS — M25559 Pain in unspecified hip: Secondary | ICD-10-CM

## 2015-12-21 NOTE — Telephone Encounter (Signed)
Order placed

## 2015-12-25 ENCOUNTER — Ambulatory Visit (HOSPITAL_BASED_OUTPATIENT_CLINIC_OR_DEPARTMENT_OTHER): Payer: Medicare Other | Admitting: Physical Medicine & Rehabilitation

## 2015-12-25 ENCOUNTER — Encounter: Payer: Self-pay | Admitting: Physical Medicine & Rehabilitation

## 2015-12-25 VITALS — BP 109/65 | HR 81 | Resp 14

## 2015-12-25 DIAGNOSIS — M5417 Radiculopathy, lumbosacral region: Secondary | ICD-10-CM

## 2015-12-25 NOTE — Progress Notes (Signed)
  PROCEDURE RECORD [facility]   Name: Philippe Poston DOB:11/03/1965 MRN: [id number]  Date:12/25/2015  Physician: Alysia Penna, MD    Nurse/CMA: Shumaker RN  Allergies:  Allergies  Allergen Reactions  . Amoxicillin   . Food     Oranges or anything with citric acid    Consent Signed: Yes.    Is patient diabetic? Yes.    CBG today? 86 Yyesterday) knows blood sugar will be higher for about 3 days after inj  Pregnant: No. LMP: No LMP for male patient. (age 48-55)  Anticoagulants: no Anti-inflammatory: no Antibiotics: no  Procedure: Left S1 epidural steroid injection  Position: Prone Start Time: 11:43 End Time: 11:47 Fluoro Time: 22 sec  RN/CMA Health and safety inspector RN    Time 11:11 11:50    BP 109/65 107/55    Pulse 81 74    Respirations 14 14    O2 Sat 99 98    SS 6 6    Pain Level 2/10 2/10     D/C home with sister in law , patient A & O X 3, D/C instructions reviewed, and sits independently.

## 2015-12-25 NOTE — Patient Instructions (Addendum)
You received an epidural steroid injection under fluoroscopic guidance. This is the most accurate way to perform an epidural injection. This injection was performed to relieve thigh or leg or foot pain that may be related to a pinched nerve in the lumbar spine. The local anesthetic injected today may cause numbness in your leg for a couple hours. If it is severe we may need to observe you for 30-60 minutes after the injection. The cortisone medicine injected today may take several days to take full effect. This medicine can also cause facial flushing or feeling of being warm.  This injection may last for days weeks or months. It can be repeated if needed. If it is not effective, another spinal level may need to be injected. Other treatments include medication management as well as physical therapy. In some cases surgery may be an option.   Please call if you'd like to schedule a shoulder injection

## 2015-12-25 NOTE — Progress Notes (Signed)
Left S1 transforaminal epidural steroid injection under fluoroscopic guidance  Indication: Lumbosacral radiculitis is not relieved by medication management or other conservative care and interfering with self-care and mobility.   Informed consent was obtained after describing risk and benefits of the procedure with the patient, this includes bleeding, bruising, infection, paralysis and medication side effects.  The patient wishes to proceed and has given written consent.  Patient was placed in prone position.  The lumbar area was marked and prepped with Betadine.  It was entered with a 25-gauge 1-1/2 inch needle and one mL of 1% lidocaine was injected into the skin and subcutaneous tissue.  Then a 22-gauge 3.5in spinal needle was inserted into the Left S1  sacral foramen under AP, lateral, and oblique view.  Then a solution containing one mL of 10 mg per mL dexamethasone and 2 mL of 1% lidocaine was injected.  The patient tolerated procedure well.  Post procedure instructions were given.  Please see post procedure form. 

## 2015-12-29 ENCOUNTER — Ambulatory Visit: Payer: Medicare Other | Attending: Sports Medicine | Admitting: Physical Therapy

## 2015-12-29 DIAGNOSIS — M5416 Radiculopathy, lumbar region: Secondary | ICD-10-CM | POA: Diagnosis present

## 2015-12-29 DIAGNOSIS — M25551 Pain in right hip: Secondary | ICD-10-CM | POA: Diagnosis present

## 2015-12-29 DIAGNOSIS — M6281 Muscle weakness (generalized): Secondary | ICD-10-CM | POA: Diagnosis present

## 2015-12-29 DIAGNOSIS — M25561 Pain in right knee: Secondary | ICD-10-CM | POA: Diagnosis present

## 2015-12-29 DIAGNOSIS — M545 Low back pain, unspecified: Secondary | ICD-10-CM

## 2015-12-30 ENCOUNTER — Ambulatory Visit: Payer: Medicare Other

## 2015-12-30 NOTE — Therapy (Signed)
[place], [name], [address] Phone: [phone]   Fax:  [phone]  Physical Therapy Evaluation  Patient Details  Name: Glenn Robertson MRN: [id number] Date of Birth: 11/23/1965 Referring Provider: Dr Leighton Ruff   Encounter Date: 12/29/2015      PT End of Session - 12/29/15 0752    Visit Number 1   Number of Visits 16   Date for PT Re-Evaluation 02/24/16   Authorization Type medicare/medicaid    Authorization Time Period KX modiefier at 15 visits    PT Start Time 1015   PT Stop Time 1100   PT Time Calculation (min) 45 min   Activity Tolerance Patient tolerated treatment well   Behavior During Therapy [facility] for tasks assessed/performed      Past Medical History  Diagnosis Date  . Diabetes mellitus   . Neuromuscular disorder ([place])   . Allergy   . Asthma   . Chronic pain   . Depression     Past Surgical History  Procedure Laterality Date  . Arm surgery  1/12    rt ulnar nerve decompression  . Epidural block injection      several  . Ulnar nerve transposition  01/19/2012    Procedure: ULNAR NERVE DECOMPRESSION/TRANSPOSITION;  Surgeon: Cammie Sickle., MD;  Location[facility];  Service: Orthopedics;  Laterality: Left;  ulnar nerve decompression vs transposition left elbow    There were no vitals filed for this visit.       Subjective Assessment - 12/29/15 1027    Subjective Pteint report no pain today. his pain is intermittent. He has pain when he trieds to do workout's 2-3x a week.    Pertinent History The patient has a history of lumbar spine, hip and knee pain. He had PT in the winter for lower back pain and radiating pain. The pain is no longer radiating down his left leg but he has not had radiating pain since last year.    Limitations Standing  biking    How long can you sit comfortably? variable but not recently    How long can you stand comfortably? 30 min of  standing    How long can you walk comfortably? no difficulty    Diagnostic tests Lumbar MRI: L4-L5 disc bulge L5 S1 mild disc bulge with some compression on L5 S1    Patient Stated Goals to return to walking and working out    Currently in Pain? No/denies            [facility] PT Assessment - 12/30/15 0001    Assessment   Medical Diagnosis Low back pain, right hip weakness/ right hip pain    Onset Date/Surgical Date --  > 3 months prior    Hand Dominance Right   Next MD Visit --  > 1 month    Prior Therapy Yes for similar issue with improved pain in his lower back    Precautions   Precautions None   Restrictions   Weight Bearing Restrictions No   Home Environment   Additional Comments nothing that effects treatment    Prior Function   Level of Independence Independent   Cognition   Overall Cognitive Status Within Functional Limits for tasks assessed   Observation/Other Assessments   Observations right toe out in standing    Sensation   Additional Comments Pt reports intermittent numbness in his foot.    Coordination   Gross Motor Movements are Fluid and  Coordinated Yes   ROM / Strength   AROM / PROM / Strength AROM;PROM;Strength   AROM   AROM Assessment Site Shoulder;Lumbar   Lumbar Flexion 50% limited    Lumbar Extension 25% limited    Lumbar - Right Rotation 25% limited    Lumbar - Left Rotation 25% limited    Strength   Strength Assessment Site Hip   Right/Left Hip Right;Left   Right Hip Flexion 4/5   Right Hip Extension 3+/5   Right Hip ABduction 3+/5   Right Hip ADduction 4/5   Left Hip Flexion 4+/5   Left Hip Extension 5/5   Left Hip ABduction 5/5   Left Hip ADduction 5/5   Flexibility   Soft Tissue Assessment /Muscle Length --  90/90 hamstring 35 degrees billateral    Palpation   Palpation comment No significant spasming noted   Ambulation/Gait   Gait Comments right toe out                            PT Education - 12/30/15 0751     Education provided Yes   Person(s) Educated Patient   Methods Explanation   Comprehension Verbalized understanding;Returned demonstration;Verbal cues required          PT Short Term Goals - 12/30/15 0809    PT SHORT TERM GOAL #1   Title Pt will increase right gross lower extremity strength by 1 grade    Time 4   Period Weeks   Status Achieved   PT SHORT TERM GOAL #2   Title Pt will report 2/10 pain at worst    Baseline pain with all activity    Period Weeks   Status New   PT SHORT TERM GOAL #3   Title Pt will be I w/ initial HEP    Time 4   Period Weeks   Status New           PT Long Term Goals - 12/30/15 [id number]    PT LONG TERM GOAL #1   Title Pt will return to the gym with a program for eccentric stregthening and general hip stregthening    Time 8   Period Weeks   Status New   PT LONG TERM GOAL #2   Title Pt will return to bike riding without anterior knee pain    Time 8   Period Weeks   Status New   PT LONG TERM GOAL #3   Title Pt will increase right hip strength to 4+/5 in order to improve right lower extremity stability.    Time 8   Period Weeks   Status New               Plan - 12/30/15 0755    Clinical Impression Statement Pt is a 50 year old male with right hip weakness and pain. He also has anterior knee pain when he does strenuos physical activity. He goes to the gym 3-4 times a week. He had been trying to stregthen his hip abductirs for several months now but has not seen much gain. He has decreased  right lower extremity stability in standing.    Rehab Potential Good   PT Frequency 2x / week   PT Duration 8 weeks   PT Treatment/Interventions ADLs/Self Care Home Management;Cryotherapy;Electrical Stimulation;Iontophoresis 4mg /ml Dexamethasone;Moist Heat;Ultrasound;Gait training;Stair training;Functional mobility training;Therapeutic activities;Therapeutic exercise;Patient/family education;Passive range of motion   PT Next Visit Plan begin  single leg stability training; consider rebounder, cone  drill, step onto air-ex; eccentric squats, mini squsts, add band to bridging;    PT Home Exercise Plan eccentric leg press, eccentric straight leg raise; lateral band walk; bridging, eccentric step down    Consulted and Agree with Plan of Care Patient      Patient will benefit from skilled therapeutic intervention in order to improve the following deficits and impairments:  Abnormal gait, Decreased activity tolerance, Decreased endurance, Hypermobility, Decreased strength, Postural dysfunction, Pain  Visit Diagnosis: Bilateral low back pain without sciatica - Plan: PT plan of care cert/re-cert  Muscle weakness (generalized) - Plan: PT plan of care cert/re-cert  Pain in right hip - Plan: PT plan of care cert/re-cert  Pain in right knee - Plan: PT plan of care cert/re-cert      G-Codes - [id number]    Functional Assessment Tool Used clinical decision making, objective measures    Functional Limitation Mobility: Walking and moving around   Mobility: Walking and Moving Around Current Status [id number]) At least 20 percent but less than 40 percent impaired, limited or restricted   Mobility: Walking and Moving Around Goal Status [phone]) At least 1 percent but less than 20 percent impaired, limited or restricted       Problem List Patient Active Problem List   Diagnosis Date Noted  . Lumbar degenerative disc disease 12/18/2015  . Subacromial impingement of left shoulder 10/26/2015  . Left lumbar radiculitis 04/21/2015  . HNP (herniated nucleus pulposus), lumbar 04/21/2015  . Chondromalacia of both patellae 02/24/2015  . Paranoid schizophrenia, chronic condition ([place]) 02/24/2015  . Chronic low back pain 07/18/2014  . Knee pain, acute 07/18/2014  . Right ankle pain 01/27/2014  . Great toe pain 01/27/2014  . Sciatica 01/10/2012  . Thoracic or lumbosacral neuritis or radiculitis, unspecified 12/15/2011  . Ulnar neuropathy at elbow  11/14/2011  . Disturbance of skin sensation 11/14/2011    Carney Living  PT DPT   12/30/2015, 8:29 AM  [facility] [address] [place], [name], [address] Phone: [phone]   Fax:  [phone]  Name: Rudi Volner MRN: [id number] Date of Birth: 1966-07-11

## 2016-01-04 ENCOUNTER — Ambulatory Visit: Payer: Medicare Other

## 2016-01-04 DIAGNOSIS — M6281 Muscle weakness (generalized): Secondary | ICD-10-CM

## 2016-01-04 DIAGNOSIS — M25551 Pain in right hip: Secondary | ICD-10-CM

## 2016-01-04 DIAGNOSIS — M545 Low back pain: Secondary | ICD-10-CM | POA: Diagnosis not present

## 2016-01-04 DIAGNOSIS — M25561 Pain in right knee: Secondary | ICD-10-CM

## 2016-01-04 NOTE — Patient Instructions (Signed)
Issued Pilates for clam and side lye straight leg raise with pelvic stabilization.  1-2 sets 5x/week  10-12 reps

## 2016-01-04 NOTE — Therapy (Signed)
[place], [name], [address] Phone: [phone]   Fax:  [phone]  Physical Therapy Treatment  Patient Details  Name: Sanjaya Philemon MRN: [id number] Date of Birth: 05-16-1966 Referring Provider: Dr Leighton Ruff   Encounter Date: 01/04/2016      PT End of Session - 01/04/16 0714    Visit Number 2   Number of Visits 16   Date for PT Re-Evaluation 02/24/16   PT Start Time 0708   PT Stop Time 0748   PT Time Calculation (min) 40 min   Activity Tolerance Patient tolerated treatment well   Behavior During Therapy [facility] for tasks assessed/performed      Past Medical History  Diagnosis Date  . Diabetes mellitus   . Neuromuscular disorder ([place])   . Allergy   . Asthma   . Chronic pain   . Depression     Past Surgical History  Procedure Laterality Date  . Arm surgery  1/12    rt ulnar nerve decompression  . Epidural block injection      several  . Ulnar nerve transposition  01/19/2012    Procedure: ULNAR NERVE DECOMPRESSION/TRANSPOSITION;  Surgeon: Cammie Sickle., MD;  Location: [facility];  Service: Orthopedics;  Laterality: Left;  ulnar nerve decompression vs transposition left elbow    There were no vitals filed for this visit.      Subjective Assessment - 01/04/16 0711    Subjective He reports LT shoulder pain today. Sore from exercise with band.     Currently in Pain? No/denies  in knee yes in LT shoulder chronic   Multiple Pain Sites --  He has multipole areas of chronic intermittant pain.                          [facility] Adult PT Treatment/Exercise - 01/04/16 0724    Knee/Hip Exercises: Standing   Other Standing Knee Exercises lateral band walks with red  band 20 feet x 2 RT and LT,  then 4 inch eccentric step downs and ups R and L leg   Knee/Hip Exercises: Supine   Bridges with Clamshell --  12 reps green band   Single Leg Bridge Right;Left;10 reps   then 5 reps RT and Lt    Straight Leg Raises Limitations poor control of keeping knee straight so slowed down and cued to keep knee straight throughout range.    Knee/Hip Exercises: Sidelying   Hip ABduction Right;Left;10 reps   Hip ABduction Limitations cues to stabilize pelvis   Clams x12 RT and LT with cues to stabillize pelvis RT and LT   Knee/Hip Exercises: Prone   Straight Leg Raises Right;Left;2 sets;10 reps                PT Education - 01/04/16 0756    Education provided Yes   Education Details sidelye clama nd SLR Pilates based and asked to add band for bridging   Person(s) Educated Patient   Methods Explanation;Demonstration;Tactile cues;Verbal cues;Handout   Comprehension Returned demonstration;Verbalized understanding          PT Short Term Goals - 01/04/16 0756    PT SHORT TERM GOAL #1   Title Pt will increase right gross lower extremity strength by 1 grade    PT SHORT TERM GOAL #3   Title Pt will be I w/ initial HEP    Status Achieved  PT Long Term Goals - 12/30/15 [id number]    PT LONG TERM GOAL #1   Title Pt will return to the gym with a program for eccentric stregthening and general hip stregthening    Time 8   Period Weeks   Status New   PT LONG TERM GOAL #2   Title Pt will return to bike riding without anterior knee pain    Time 8   Period Weeks   Status New   PT LONG TERM GOAL #3   Title Pt will increase right hip strength to 4+/5 in order to improve right lower extremity stability.    Time 8   Period Weeks   Status New               Plan - 01/04/16 0715    Clinical Impression Statement Sore from HEP but he was able to do the exercise today. Only second visit so no changes.    PT Next Visit Plan begin single leg stability training; consider rebounder, cone drill, step onto air-ex; eccentric squats, mini squsts, review sidelye exercises    PT Home Exercise Plan sidelye exercises   Consulted and Agree with Plan of Care  Patient      Patient will benefit from skilled therapeutic intervention in order to improve the following deficits and impairments:  Abnormal gait, Decreased activity tolerance, Decreased endurance, Hypermobility, Decreased strength, Postural dysfunction, Pain  Visit Diagnosis: Muscle weakness (generalized)  Pain in right hip  Pain in right knee     Problem List Patient Active Problem List   Diagnosis Date Noted  . Lumbar degenerative disc disease 12/18/2015  . Subacromial impingement of left shoulder 10/26/2015  . Left lumbar radiculitis 04/21/2015  . HNP (herniated nucleus pulposus), lumbar 04/21/2015  . Chondromalacia of both patellae 02/24/2015  . Paranoid schizophrenia, chronic condition ([place]) 02/24/2015  . Chronic low back pain 07/18/2014  . Knee pain, acute 07/18/2014  . Right ankle pain 01/27/2014  . Great toe pain 01/27/2014  . Sciatica 01/10/2012  . Thoracic or lumbosacral neuritis or radiculitis, unspecified 12/15/2011  . Ulnar neuropathy at elbow 11/14/2011  . Disturbance of skin sensation 11/14/2011    [name]  PT 01/04/2016, 7:58 AM  [facility] [address] [place], [name], [address] Phone: [phone]   Fax:  [phone]  Name: Breland Casillo MRN: [id number] Date of Birth: November 13, 1965

## 2016-01-06 ENCOUNTER — Ambulatory Visit: Payer: Medicare Other

## 2016-01-06 DIAGNOSIS — M545 Low back pain: Secondary | ICD-10-CM | POA: Diagnosis not present

## 2016-01-06 DIAGNOSIS — M6281 Muscle weakness (generalized): Secondary | ICD-10-CM

## 2016-01-06 NOTE — Therapy (Signed)
[place], [name], [address] Phone: [phone]   Fax:  [phone]  Physical Therapy Treatment  Patient Details  Name: Glenn Robertson MRN: [id number] Date of Birth: February 22, 1966 Referring Provider: Dr Leighton Ruff   Encounter Date: 01/06/2016      PT End of Session - 01/06/16 0735    Visit Number 3   Number of Visits 16   Date for PT Re-Evaluation 02/24/16   PT Start Time 0655   PT Stop Time 0733   PT Time Calculation (min) 38 min   Activity Tolerance Patient tolerated treatment well   Behavior During Therapy [facility] for tasks assessed/performed      Past Medical History  Diagnosis Date  . Diabetes mellitus   . Neuromuscular disorder ([place])   . Allergy   . Asthma   . Chronic pain   . Depression     Past Surgical History  Procedure Laterality Date  . Arm surgery  1/12    rt ulnar nerve decompression  . Epidural block injection      several  . Ulnar nerve transposition  01/19/2012    Procedure: ULNAR NERVE DECOMPRESSION/TRANSPOSITION;  Surgeon: Cammie Sickle., MD;  Location: [facility];  Service: Orthopedics;  Laterality: Left;  ulnar nerve decompression vs transposition left elbow    There were no vitals filed for this visit.      Subjective Assessment - 01/06/16 0703    Subjective No pain today . Haven't run in [place] . Plan on starting to run in August 2017 trying to build strength until them   Currently in Pain? No/denies                         [facility] Adult PT Treatment/Exercise - 01/06/16 0708    Neuro Re-ed    Neuro Re-ed Details   Stood single leg RT nd LT with back leg on toes ball toss 2 sets 10 reps then balanc ewith lowering each side on tilt board x 20 RT and LT  with cue for erect body position    Knee/Hip Exercises: Aerobic   Nustep L6 6 min LE only   Knee/Hip Exercises: Standing   Other Standing Knee Exercises opposite knee/hip flexion x 10  RT and Lt on 8 inch step then stand side to wall with wall sid leg flexed with abduction push 5 sec x 10 RT and Lt  then     Knee/Hip Exercises: Supine   Single Leg Bridge Right;Left  12 reps   Knee/Hip Exercises: Sidelying   Hip ABduction Right;Left  12 reps   Hip ABduction Limitations cues to stabilize pelvis   Clams x15  RT and LT with cues to stabillize pelvis RT and LT, needed less cuing today   Knee/Hip Exercises: Prone   Straight Leg Raises Right;Left;20 reps                  PT Short Term Goals - 01/06/16 0736    PT SHORT TERM GOAL #1   Title Pt will increase right gross lower extremity strength by 1 grade    Status Unable to assess   PT SHORT TERM GOAL #2   Title Pt will report 2/10 pain at worst    Baseline so far he reports no pain in legs or back    Status Achieved   PT SHORT TERM GOAL #3   Title Pt will be I w/ initial  HEP    Status On-going           PT Long Term Goals - 12/30/15 [id number]    PT LONG TERM GOAL #1   Title Pt will return to the gym with a program for eccentric stregthening and general hip stregthening    Time 8   Period Weeks   Status New   PT LONG TERM GOAL #2   Title Pt will return to bike riding without anterior knee pain    Time 8   Period Weeks   Status New   PT LONG TERM GOAL #3   Title Pt will increase right hip strength to 4+/5 in order to improve right lower extremity stability.    Time 8   Period Weeks   Status New               Plan - 01/06/16 0705    Clinical Impression Statement  Mcauley was able to do all exercise without pain and minor fatigue. He reported legs felt fine but hips slightly sore.    PT Next Visit Plan continue hip strengthening   Consulted and Agree with Plan of Care Patient      Patient will benefit from skilled therapeutic intervention in order to improve the following deficits and impairments:  Abnormal gait, Decreased activity tolerance, Decreased endurance, Hypermobility, Decreased  strength, Postural dysfunction, Pain  Visit Diagnosis: Muscle weakness (generalized)     Problem List Patient Active Problem List   Diagnosis Date Noted  . Lumbar degenerative disc disease 12/18/2015  . Subacromial impingement of left shoulder 10/26/2015  . Left lumbar radiculitis 04/21/2015  . HNP (herniated nucleus pulposus), lumbar 04/21/2015  . Chondromalacia of both patellae 02/24/2015  . Paranoid schizophrenia, chronic condition ([place]) 02/24/2015  . Chronic low back pain 07/18/2014  . Knee pain, acute 07/18/2014  . Right ankle pain 01/27/2014  . Great toe pain 01/27/2014  . Sciatica 01/10/2012  . Thoracic or lumbosacral neuritis or radiculitis, unspecified 12/15/2011  . Ulnar neuropathy at elbow 11/14/2011  . Disturbance of skin sensation 11/14/2011    [name]   PT 01/06/2016, 7:37 AM  [facility] [address] [place], [name], [address] Phone: [phone]   Fax:  [phone]  Name: Aikam Elbaz MRN: [id number] Date of Birth: 25-Oct-1965

## 2016-01-06 NOTE — Patient Instructions (Signed)
Instructed  Glenn Robertson to do the side lying exer tomorrow and not do them Friday then do them Sat and Sun.

## 2016-01-11 ENCOUNTER — Ambulatory Visit: Payer: Medicare Other | Admitting: Physical Therapy

## 2016-01-11 DIAGNOSIS — M5416 Radiculopathy, lumbar region: Secondary | ICD-10-CM

## 2016-01-11 DIAGNOSIS — M545 Low back pain, unspecified: Secondary | ICD-10-CM

## 2016-01-11 DIAGNOSIS — M25551 Pain in right hip: Secondary | ICD-10-CM

## 2016-01-11 DIAGNOSIS — M25561 Pain in right knee: Secondary | ICD-10-CM

## 2016-01-11 DIAGNOSIS — M6281 Muscle weakness (generalized): Secondary | ICD-10-CM

## 2016-01-11 NOTE — Therapy (Signed)
[place], [name], [address] Phone: [phone]   Fax:  [phone]  Physical Therapy Treatment  Patient Details  Name: Glenn Robertson MRN: [id number] Date of Birth: 01-12-1966 Referring Provider: Dr Leighton Ruff   Encounter Date: 01/11/2016      PT End of Session - 01/11/16 0816    Visit Number 4   Number of Visits 16   Date for PT Re-Evaluation 02/24/16   Authorization Type medicare/medicaid    Authorization Time Period KX [place] at 15 visits    PT Start Time 0801   PT Stop Time 0845   PT Time Calculation (min) 44 min      Past Medical History  Diagnosis Date  . Diabetes mellitus   . Neuromuscular disorder ([place])   . Allergy   . Asthma   . Chronic pain   . Depression     Past Surgical History  Procedure Laterality Date  . Arm surgery  1/12    rt ulnar nerve decompression  . Epidural block injection      several  . Ulnar nerve transposition  01/19/2012    Procedure: ULNAR NERVE DECOMPRESSION/TRANSPOSITION;  Surgeon: Cammie Sickle., MD;  Location: [place];  Service: Orthopedics;  Laterality: Left;  ulnar nerve decompression vs transposition left elbow    There were no vitals filed for this visit.      Subjective Assessment - 01/11/16 0846    Subjective No pain                         [facility] Adult PT Treatment/Exercise - 01/11/16 0001    Knee/Hip Exercises: Aerobic   Nustep L6 6 min LE only   Knee/Hip Exercises: Standing   Other Standing Knee Exercises lateral band walks green band walks at knees and at ankles 20 ft x 2 each way, standing SLS while tapping cone with hands 10 x rt/lt   x 2 each side   Knee/Hip Exercises: Supine   Bridges with Clamshell 10 reps;3 sets  green band   Single Leg Bridge Right;Left;5 sets;15 reps   Knee/Hip Exercises: Sidelying   Hip ABduction Right;Left;5 sets;15 reps  2 sets each side   Hip ABduction  Limitations cues to stabilize pelvis   Clams x15  RT and LT with cues to stabillize pelvis RT and LT, needed less cuing today   Knee/Hip Exercises: Prone   Straight Leg Raises 2 sets;15 reps;Right;Left                  PT Short Term Goals - 01/06/16 0736    PT SHORT TERM GOAL #1   Title Pt will increase right gross lower extremity strength by 1 grade    Status Unable to assess   PT SHORT TERM GOAL #2   Title Pt will report 2/10 pain at worst    Baseline so far he reports no pain in legs or back    Status Achieved   PT SHORT TERM GOAL #3   Title Pt will be I w/ initial HEP    Status On-going           PT Long Term Goals - 12/30/15 [id number]    PT LONG TERM GOAL #1   Title Pt will return to the gym with a program for eccentric stregthening and general hip stregthening    Time 8   Period Weeks   Status New   PT  LONG TERM GOAL #2   Title Pt will return to bike riding without anterior knee pain    Time 8   Period Weeks   Status New   PT LONG TERM GOAL #3   Title Pt will increase right hip strength to 4+/5 in order to improve right lower extremity stability.    Time 8   Period Weeks   Status New               Plan - 01/11/16 0845    Clinical Impression Statement Progressed pt's sets and reps without increased pain. Proressing toward strength goals    PT Next Visit Plan continue hip strengthening      Patient will benefit from skilled therapeutic intervention in order to improve the following deficits and impairments:  Abnormal gait, Decreased activity tolerance, Decreased endurance, Hypermobility, Decreased strength, Postural dysfunction, Pain  Visit Diagnosis: Muscle weakness (generalized)  Pain in right hip  Bilateral low back pain without sciatica  Lumbar radiculitis  Pain in right knee  Muscle weakness     Problem List Patient Active Problem List   Diagnosis Date Noted  . Lumbar degenerative disc disease 12/18/2015  . Subacromial  impingement of left shoulder 10/26/2015  . Left lumbar radiculitis 04/21/2015  . HNP (herniated nucleus pulposus), lumbar 04/21/2015  . Chondromalacia of both patellae 02/24/2015  . Paranoid schizophrenia, chronic condition ([place]) 02/24/2015  . Chronic low back pain 07/18/2014  . Knee pain, acute 07/18/2014  . Right ankle pain 01/27/2014  . Great toe pain 01/27/2014  . Sciatica 01/10/2012  . Thoracic or lumbosacral neuritis or radiculitis, unspecified 12/15/2011  . Ulnar neuropathy at elbow 11/14/2011  . Disturbance of skin sensation 11/14/2011    Dorene Ar, PTA 01/11/2016, 8:46 AM  [facility] [address] [place], [name], [address] Phone: [phone]   Fax:  [phone]  Name: Trennon Saucerman MRN: [id number] Date of Birth: 23-May-1966

## 2016-01-14 ENCOUNTER — Ambulatory Visit: Payer: Medicare Other | Admitting: Physical Therapy

## 2016-01-14 DIAGNOSIS — M5416 Radiculopathy, lumbar region: Secondary | ICD-10-CM

## 2016-01-14 DIAGNOSIS — M545 Low back pain, unspecified: Secondary | ICD-10-CM

## 2016-01-14 DIAGNOSIS — M25561 Pain in right knee: Secondary | ICD-10-CM

## 2016-01-14 DIAGNOSIS — M25551 Pain in right hip: Secondary | ICD-10-CM

## 2016-01-14 DIAGNOSIS — M6281 Muscle weakness (generalized): Secondary | ICD-10-CM

## 2016-01-14 NOTE — Therapy (Signed)
[place], [name], [address] Phone: [phone]   Fax:  [phone]  Physical Therapy Treatment  Patient Details  Name: Glenn Robertson MRN: [id number] Date of Birth: 01-17-1966 Referring Provider: Dr Leighton Ruff   Encounter Date: 01/14/2016      PT End of Session - 01/14/16 1111    Visit Number 5   Number of Visits 16   Date for PT Re-Evaluation 02/24/16   PT Start Time 1100   PT Stop Time 1155   PT Time Calculation (min) 55 min      Past Medical History  Diagnosis Date  . Diabetes mellitus   . Neuromuscular disorder ([place])   . Allergy   . Asthma   . Chronic pain   . Depression     Past Surgical History  Procedure Laterality Date  . Arm surgery  1/12    rt ulnar nerve decompression  . Epidural block injection      several  . Ulnar nerve transposition  01/19/2012    Procedure: ULNAR NERVE DECOMPRESSION/TRANSPOSITION;  Surgeon: Cammie Sickle., MD;  Location: [facility];  Service: Orthopedics;  Laterality: Left;  ulnar nerve decompression vs transposition left elbow    There were no vitals filed for this visit.      Subjective Assessment - 01/14/16 1105    Subjective Back is hurting from the 7 mile bike ride I did yesterday. I have not been on the bike in 1 month.    Currently in Pain? Yes   Pain Score 4    Pain Location Back   Pain Orientation Right;Left;Mid   Pain Descriptors / Indicators --  just hurts   Aggravating Factors  riding bike   Pain Relieving Factors not riding bike                         [facility] Adult PT Treatment/Exercise - 01/14/16 0001    Knee/Hip Exercises: Aerobic   Nustep L6 5 min LE only   Knee/Hip Exercises: Standing   Other Standing Knee Exercises standing SLS while tapping cone with hands 10 x rt/lt   x 2 each side   Knee/Hip Exercises: Supine   Bridges with Clamshell 3 sets;15 reps  green band   Single Leg Bridge 3  sets;15 reps   Straight Leg Raises Right;Left;2 sets;20 reps   Straight Leg Raises Limitations 2#   Knee/Hip Exercises: Sidelying   Other Sidelying Knee/Hip Exercises all 4 exercises green band clams and reverse clams, 2 sets of 15 all 4 exerciess   Knee/Hip Exercises: Prone   Straight Leg Raises Right;Left;2 sets;20 reps   Straight Leg Raises Limitations 2#   Modalities   Modalities Moist Heat   Moist Heat Therapy   Number Minutes Moist Heat 15 Minutes   Moist Heat Location Lumbar Spine                  PT Short Term Goals - 01/14/16 1132    PT SHORT TERM GOAL #1   Title Pt will increase right gross lower extremity strength by 1 grade    Time 4   Period Weeks   Status Unable to assess   PT SHORT TERM GOAL #2   Title Pt will report 2/10 pain at worst    Baseline unless he rides his bike   Time 4   Period Weeks   Status Partially Met   PT SHORT TERM GOAL #  3   Title Pt will be I w/ initial HEP    Time 4   Period Weeks   Status Achieved           PT Long Term Goals - 01/14/16 1133    PT LONG TERM GOAL #1   Title Pt will return to the gym with a program for eccentric stregthening and general hip stregthening    Time 8   Period Weeks   Status On-going   PT LONG TERM GOAL #2   Title Pt will return to bike riding without anterior knee pain    Time 8   Period Weeks   Status Achieved   PT LONG TERM GOAL #3   Title Pt will increase right hip strength to 4+/5 in order to improve right lower extremity stability.    Time 8   Period Weeks   Status Unable to assess   PT LONG TERM GOAL #4   Time --               Plan - 01/14/16 1135    Clinical Impression Statement Instucted pt in [place] clamshell exercises without increased pain, only fatigue. Continued to increase resistance and reps for hip strengthening on mat. Progressing  otward strength goals. Pt rode 7 mile bike ride without knee pain but he did have increased back pain. Worse this morning.  LTG# 2 Met. He is independent with his HEP. STG# 3 Met.    PT Next Visit Plan continue hip strengthening, recheck MMT, more closed chain as tolerated      Patient will benefit from skilled therapeutic intervention in order to improve the following deficits and impairments:  Abnormal gait, Decreased activity tolerance, Decreased endurance, Hypermobility, Decreased strength, Postural dysfunction, Pain  Visit Diagnosis: Muscle weakness (generalized)  Pain in right hip  Bilateral low back pain without sciatica  Lumbar radiculitis  Pain in right knee     Problem List Patient Active Problem List   Diagnosis Date Noted  . Lumbar degenerative disc disease 12/18/2015  . Subacromial impingement of left shoulder 10/26/2015  . Left lumbar radiculitis 04/21/2015  . HNP (herniated nucleus pulposus), lumbar 04/21/2015  . Chondromalacia of both patellae 02/24/2015  . Paranoid schizophrenia, chronic condition ([place]) 02/24/2015  . Chronic low back pain 07/18/2014  . Knee pain, acute 07/18/2014  . Right ankle pain 01/27/2014  . Great toe pain 01/27/2014  . Sciatica 01/10/2012  . Thoracic or lumbosacral neuritis or radiculitis, unspecified 12/15/2011  . Ulnar neuropathy at elbow 11/14/2011  . Disturbance of skin sensation 11/14/2011    Dorene Ar, PTA 01/14/2016, 11:54 AM  [facility] [address] [place], [name], [address] Phone: [phone]   Fax:  [phone]  Name: Glenn Robertson MRN: [id number] Date of Birth: September 16, 1965

## 2016-01-21 ENCOUNTER — Encounter: Payer: Medicare Other | Admitting: Physical Therapy

## 2016-01-22 ENCOUNTER — Ambulatory Visit: Payer: Medicare Other

## 2016-01-22 DIAGNOSIS — M6281 Muscle weakness (generalized): Secondary | ICD-10-CM

## 2016-01-22 DIAGNOSIS — M545 Low back pain, unspecified: Secondary | ICD-10-CM

## 2016-01-22 DIAGNOSIS — M25551 Pain in right hip: Secondary | ICD-10-CM

## 2016-01-22 NOTE — Therapy (Signed)
[place], [name], [address] Phone: [phone]   Fax:  [phone]  Physical Therapy Treatment  Patient Details  Name: Glenn Robertson MRN: [id number] Date of Birth: 01-16-1966 Referring Provider: Dr Leighton Ruff   Encounter Date: 01/22/2016      PT End of Session - 01/22/16 0906    Visit Number 6   Number of Visits 16   Date for PT Re-Evaluation 02/24/16   PT Start Time 0840   PT Stop Time 0930   PT Time Calculation (min) 50 min   Activity Tolerance Patient tolerated treatment well   Behavior During Therapy [facility] for tasks assessed/performed      Past Medical History  Diagnosis Date  . Diabetes mellitus   . Neuromuscular disorder ([place])   . Allergy   . Asthma   . Chronic pain   . Depression     Past Surgical History  Procedure Laterality Date  . Arm surgery  1/12    rt ulnar nerve decompression  . Epidural block injection      several  . Ulnar nerve transposition  01/19/2012    Procedure: ULNAR NERVE DECOMPRESSION/TRANSPOSITION;  Surgeon: Cammie Sickle., MD;  Location: [facility];  Service: Orthopedics;  Laterality: Left;  ulnar nerve decompression vs transposition left elbow    There were no vitals filed for this visit.      Subjective Assessment - 01/22/16 0847    Subjective Hurt my back from hamstring curls on different machine than normal. ( Encouraged him to decr weigh when trying new machines)   Currently in Pain? Yes   Pain Score 4    Pain Location Back   Pain Orientation Right;Left   Pain Type Chronic pain   Pain Onset More than a month ago   Aggravating Factors  Various activity that he appears to overdo.                          [facility] Adult PT Treatment/Exercise - 01/22/16 0001    Knee/Hip Exercises: Aerobic   Nustep L6     8 min LE only   Knee/Hip Exercises: Standing   Other Standing Knee Exercises side steps with green band on knees   12 feet RT and LT x5 and around ankles 3 reps RT and LT  12 feet.    Knee/Hip Exercises: Supine   Bridges with Clamshell Both;1 set;15 reps   Single Leg Bridge Right;Left;2 sets;15 reps   Other Supine Knee/Hip Exercises abdominal crunches with ball (large red ball ) reach x 15   Knee/Hip Exercises: Sidelying   Clams x15  RT and LT with minor cues to stabillize pelvis RT and LT, needed less cuing today   Knee/Hip Exercises: Prone   Hamstring Curl 20 reps  RT and LT   4 pounds   Moist Heat Therapy   Number Minutes Moist Heat 15 Minutes   Moist Heat Location Lumbar Spine   Manual Therapy   Manual Therapy Muscle Energy Technique                  PT Short Term Goals - 01/14/16 1132    PT SHORT TERM GOAL #1   Title Pt will increase right gross lower extremity strength by 1 grade    Time 4   Period Weeks   Status Unable to assess   PT SHORT TERM GOAL #2   Title Pt will report  2/10 pain at worst    Baseline unless he rides his bike   Time 4   Period Weeks   Status Partially Met   PT SHORT TERM GOAL #3   Title Pt will be I w/ initial HEP    Time 4   Period Weeks   Status Achieved           PT Long Term Goals - 01/14/16 1133    PT LONG TERM GOAL #1   Title Pt will return to the gym with a program for eccentric stregthening and general hip stregthening    Time 8   Period Weeks   Status On-going   PT LONG TERM GOAL #2   Title Pt will return to bike riding without anterior knee pain    Time 8   Period Weeks   Status Achieved   PT LONG TERM GOAL #3   Title Pt will increase right hip strength to 4+/5 in order to improve right lower extremity stability.    Time 8   Period Weeks   Status Unable to assess   PT LONG TERM GOAL #4   Time --               Plan - 01/22/16 0908    Clinical Impression Statement [name] declined heat today even though he reported back pain. We discussed need with his problems to modify activity and to be aware of when he is  able to do the activity withless pain( he rpeorted biking in AM did not exacerbate his back pain as biking later in day does) . He is certainly fit enough to do the more challenging exercises her. He may need to be more aware and manage his exercise and pain . I suggested he may not be painfree due to his chronic conditions.     PT Next Visit Plan continue hip strengthening, recheck MMT, more closed chain as tolerated   Consulted and Agree with Plan of Care Patient      Patient will benefit from skilled therapeutic intervention in order to improve the following deficits and impairments:  Abnormal gait, Decreased activity tolerance, Decreased endurance, Hypermobility, Decreased strength, Postural dysfunction, Pain  Visit Diagnosis: Muscle weakness (generalized)  Pain in right hip  Bilateral low back pain without sciatica     Problem List Patient Active Problem List   Diagnosis Date Noted  . Lumbar degenerative disc disease 12/18/2015  . Subacromial impingement of left shoulder 10/26/2015  . Left lumbar radiculitis 04/21/2015  . HNP (herniated nucleus pulposus), lumbar 04/21/2015  . Chondromalacia of both patellae 02/24/2015  . Paranoid schizophrenia, chronic condition ([place]) 02/24/2015  . Chronic low back pain 07/18/2014  . Knee pain, acute 07/18/2014  . Right ankle pain 01/27/2014  . Great toe pain 01/27/2014  . Sciatica 01/10/2012  . Thoracic or lumbosacral neuritis or radiculitis, unspecified 12/15/2011  . Ulnar neuropathy at elbow 11/14/2011  . Disturbance of skin sensation 11/14/2011    [name]  Pt 01/22/2016, 9:29 AM  [facility] [address] [place], [name], [address] Phone: [phone]   Fax:  [phone]  Name: Glenn Robertson MRN: [id number] Date of Birth: [birth year]

## 2016-01-26 ENCOUNTER — Ambulatory Visit: Payer: Medicare Other | Admitting: Physical Therapy

## 2016-01-26 DIAGNOSIS — M25551 Pain in right hip: Secondary | ICD-10-CM

## 2016-01-26 DIAGNOSIS — M545 Low back pain: Secondary | ICD-10-CM | POA: Diagnosis not present

## 2016-01-26 DIAGNOSIS — M25561 Pain in right knee: Secondary | ICD-10-CM

## 2016-01-26 DIAGNOSIS — M6281 Muscle weakness (generalized): Secondary | ICD-10-CM

## 2016-01-26 NOTE — Patient Instructions (Signed)
JOSPT hip strengthening from Exercise drawer.  5 to 15 X each.  3-4 X a week

## 2016-01-26 NOTE — Therapy (Signed)
[place], [name], [address] Phone: [phone]   Fax:  [phone]  Physical Therapy Treatment  Patient Details  Name: Glenn Robertson MRN: [id number] Date of Birth: [birth year] Referring Provider: Dr Leighton Ruff   Encounter Date: 01/26/2016      PT End of Session - 01/26/16 0812    Visit Number 7   Number of Visits 16   Date for PT Re-Evaluation 02/24/16   PT Start Time 0735   PT Stop Time 0801   PT Time Calculation (min) 26 min   Activity Tolerance Patient tolerated treatment well;No increased pain   Behavior During Therapy [facility] for tasks assessed/performed      Past Medical History  Diagnosis Date  . Diabetes mellitus   . Neuromuscular disorder ([place])   . Allergy   . Asthma   . Chronic pain   . Depression     Past Surgical History  Procedure Laterality Date  . Arm surgery  1/12    rt ulnar nerve decompression  . Epidural block injection      several  . Ulnar nerve transposition  01/19/2012    Procedure: ULNAR NERVE DECOMPRESSION/TRANSPOSITION;  Surgeon: Cammie Sickle., MD;  Location: [facility];  Service: Orthopedics;  Laterality: Left;  ulnar nerve decompression vs transposition left elbow    There were no vitals filed for this visit.      Subjective Assessment - 01/26/16 0738    Subjective No knee pain.  Has shoulder pain.  2-3/10     Currently in Pain? No/denies   Pain Location Knee   Pain Orientation Right;Left   Aggravating Factors  running   Pain Relieving Factors not running            [facility] PT Assessment - 01/26/16 0001    Strength   Right Hip ABduction 4/5   Left Hip ABduction 5/5                     [facility] Adult PT Treatment/Exercise - 01/26/16 0001    Lumbar Exercises: Quadruped   Straight Leg Raise 15 reps   Other Quadruped Lumbar Exercises BENT KNEE lift 15 X each, cued initially for neutral back   Knee/Hip Exercises: Stretches    Passive Hamstring Stretch 3 reps;30 seconds   Gastroc Stretch 1 rep;30 seconds   Knee/Hip Exercises: Standing   Other Standing Knee Exercises minisquat with side steps, green band 5 X 2-5 steps, HEP   Knee/Hip Exercises: Supine   Single Leg Bridge Right;Left;2 sets;15 reps   Knee/Hip Exercises: Sidelying   Clams 15  green bands                PT Education - 01/26/16 0812    Education provided Yes   Education Details Hip strengthening   Person(s) Educated Patient   Methods Explanation;Tactile cues;Verbal cues;Handout   Comprehension Verbalized understanding;Returned demonstration          PT Short Term Goals - 01/26/16 0816    PT SHORT TERM GOAL #1   Title Pt will increase right gross lower extremity strength by 1 grade    Baseline Abduction met, others not tested   Period Weeks   Status Partially Met   PT SHORT TERM GOAL #2   Title Pt will report 2/10 pain at worst    Baseline not running, so not tested.    Time 4   Period Weeks   Status Unable to assess  PT SHORT TERM GOAL #3   Title Pt will be I w/ initial HEP    Status Achieved           PT Long Term Goals - 01/26/16 0819    PT LONG TERM GOAL #1   Title Pt will return to the gym with a program for eccentric stregthening and general hip stregthening    Baseline He goes to the gym to work out sometimes   Time 8   Status On-going   PT LONG TERM GOAL #2   Title Pt will return to bike riding without anterior knee pain    Time 8   Period Weeks   Status Achieved   PT LONG TERM GOAL #3   Title Pt will increase right hip strength to 4+/5 in order to improve right lower extremity stability.    Baseline 4/5   Time 8   Period Weeks   Status On-going               Plan - 01/26/16 0813    Clinical Impression Statement No pain today.  Hip strengthening focus.  He said he was really here for his arms, but will get to them at anoither time.  Progress toward home exercises and strengthening hips.  gpoals.     PT Next Visit Plan review hip strengthening.  Closed chain, work toward goals.    PT Home Exercise Plan JOSPT hips   Consulted and Agree with Plan of Care Patient      Patient will benefit from skilled therapeutic intervention in order to improve the following deficits and impairments:  Abnormal gait, Decreased activity tolerance, Decreased endurance, Hypermobility, Decreased strength, Postural dysfunction, Pain  Visit Diagnosis: Muscle weakness (generalized)  Pain in right hip  Pain in right knee     Problem List Patient Active Problem List   Diagnosis Date Noted  . Lumbar degenerative disc disease 12/18/2015  . Subacromial impingement of left shoulder 10/26/2015  . Left lumbar radiculitis 04/21/2015  . HNP (herniated nucleus pulposus), lumbar 04/21/2015  . Chondromalacia of both patellae 02/24/2015  . Paranoid schizophrenia, chronic condition ([place]) 02/24/2015  . Chronic low back pain 07/18/2014  . Knee pain, acute 07/18/2014  . Right ankle pain 01/27/2014  . Great toe pain 01/27/2014  . Sciatica 01/10/2012  . Thoracic or lumbosacral neuritis or radiculitis, unspecified 12/15/2011  . Ulnar neuropathy at elbow 11/14/2011  . Disturbance of skin sensation 11/14/2011    Kaylah Chiasson 01/26/2016, 8:22 AM  [facility] [address] [place], [name], [address] Phone: [phone]   Fax:  [phone]  Name: Glenn Robertson MRN: [id number] Date of Birth: [birth year]    Melvenia Needles, PTA 01/26/2016 8:22 AM Phone: [phone] Fax: [phone]

## 2016-02-01 ENCOUNTER — Ambulatory Visit: Payer: Medicare Other | Attending: Family Medicine

## 2016-02-01 DIAGNOSIS — M25561 Pain in right knee: Secondary | ICD-10-CM | POA: Diagnosis present

## 2016-02-01 DIAGNOSIS — M6281 Muscle weakness (generalized): Secondary | ICD-10-CM | POA: Diagnosis present

## 2016-02-01 DIAGNOSIS — M545 Low back pain, unspecified: Secondary | ICD-10-CM

## 2016-02-01 DIAGNOSIS — M25551 Pain in right hip: Secondary | ICD-10-CM | POA: Diagnosis present

## 2016-02-01 NOTE — Therapy (Signed)
[place], [name], [address] Phone: [phone]   Fax:  [phone]  Physical Therapy Treatment  Patient Details  Name: Glenn Robertson MRN: [id number] Date of Birth: [birth year] Referring Provider: Dr Leighton Ruff   Encounter Date: 02/01/2016      PT End of Session - 02/01/16 0712    Visit Number 8   Number of Visits 16   Date for PT Re-Evaluation 02/24/16   Authorization Type medicare/medicaid    PT Start Time 0700   PT Stop Time 0745   PT Time Calculation (min) 45 min   Activity Tolerance Patient tolerated treatment well   Behavior During Therapy [facility] for tasks assessed/performed      Past Medical History  Diagnosis Date  . Diabetes mellitus   . Neuromuscular disorder ([place])   . Allergy   . Asthma   . Chronic pain   . Depression     Past Surgical History  Procedure Laterality Date  . Arm surgery  1/12    rt ulnar nerve decompression  . Epidural block injection      several  . Ulnar nerve transposition  01/19/2012    Procedure: ULNAR NERVE DECOMPRESSION/TRANSPOSITION;  Surgeon: Cammie Sickle., MD;  Location: [facility];  Service: Orthopedics;  Laterality: Left;  ulnar nerve decompression vs transposition left elbow    There were no vitals filed for this visit.      Subjective Assessment - 02/01/16 0709    Subjective No pain today. Rode 17 miles last week. Blood glucose  low so had to stop but was able to finish. Rode 30 miles last week   Currently in Pain? No/denies                         [facility] Adult PT Treatment/Exercise - 02/01/16 0719    Neuro Re-ed    Neuro Re-ed Details  single le stand alternating touch to orange plyo ball x 8 RT and LT 2 sets.    Lumbar Exercises: Quadruped   Opposite Arm/Leg Raise Right arm/Left leg;Left arm/Right leg;10 reps;3 seconds   Knee/Hip Exercises: Stretches   Active Hamstring Stretch Right;Left;2 reps;60 seconds   Piriformis Stretch Right;Left;60 seconds;1 rep   Knee/Hip Exercises: Aerobic   Nustep L7     8 min LE only   Knee/Hip Exercises: Standing   Other Standing Knee Exercises stand on 2 inch step with stance knee straight and pelvis raised on stance leg with swing other leg from heel to outer edge of boox. x 12 then pelvic tilts with cues to keep legs straight and touch leg to floor and raise. x 15 RT and LT each.    Knee/Hip Exercises: Supine   Single Leg Bridge Right;Left;15 reps   Knee/Hip Exercises: Sidelying   Clams green band at knees x 20 RT and LT. and reverse clams green band x 15                  PT Short Term Goals - 01/26/16 0816    PT SHORT TERM GOAL #1   Title Pt will increase right gross lower extremity strength by 1 grade    Baseline Abduction met, others not tested   Period Weeks   Status Partially Met   PT SHORT TERM GOAL #2   Title Pt will report 2/10 pain at worst    Baseline not running, so not tested.    Time 4  Period Weeks   Status Unable to assess   PT SHORT TERM GOAL #3   Title Pt will be I w/ initial HEP    Status Achieved           PT Long Term Goals - 01/26/16 0819    PT LONG TERM GOAL #1   Title Pt will return to the gym with a program for eccentric stregthening and general hip stregthening    Baseline He goes to the gym to work out sometimes   Time 8   Status On-going   PT LONG TERM GOAL #2   Title Pt will return to bike riding without anterior knee pain    Time 8   Period Weeks   Status Achieved   PT LONG TERM GOAL #3   Title Pt will increase right hip strength to 4+/5 in order to improve right lower extremity stability.    Baseline 4/5   Time 8   Period Weeks   Status On-going               Plan - 02/01/16 0714    Clinical Impression Statement No pain today nad he continues to work out with excellant tolearance with his chronic pain issues. It is unlikely he will be pain free and he has  a good HEP from his episodes  of care in PT and his general overall fitness and exercise tolerance is very good.    PT Next Visit Plan   Closed chain, work toward goals.    Consulted and Agree with Plan of Care Patient      Patient will benefit from skilled therapeutic intervention in order to improve the following deficits and impairments:  Abnormal gait, Decreased activity tolerance, Decreased endurance, Hypermobility, Decreased strength, Postural dysfunction, Pain  Visit Diagnosis: Muscle weakness (generalized)  Pain in right hip  Pain in right knee  Bilateral low back pain without sciatica     Problem List Patient Active Problem List   Diagnosis Date Noted  . Lumbar degenerative disc disease 12/18/2015  . Subacromial impingement of left shoulder 10/26/2015  . Left lumbar radiculitis 04/21/2015  . HNP (herniated nucleus pulposus), lumbar 04/21/2015  . Chondromalacia of both patellae 02/24/2015  . Paranoid schizophrenia, chronic condition ([place]) 02/24/2015  . Chronic low back pain 07/18/2014  . Knee pain, acute 07/18/2014  . Right ankle pain 01/27/2014  . Great toe pain 01/27/2014  . Sciatica 01/10/2012  . Thoracic or lumbosacral neuritis or radiculitis, unspecified 12/15/2011  . Ulnar neuropathy at elbow 11/14/2011  . Disturbance of skin sensation 11/14/2011    [name] PT 02/01/2016, 7:49 AM  [facility] [address] [place], [name], [address] Phone: [phone]   Fax:  [phone]  Name: Glenn Robertson MRN: [id number] Date of Birth: 03/13/1966

## 2016-02-08 ENCOUNTER — Ambulatory Visit: Payer: Medicare Other

## 2016-02-08 DIAGNOSIS — M545 Low back pain, unspecified: Secondary | ICD-10-CM

## 2016-02-08 DIAGNOSIS — M6281 Muscle weakness (generalized): Secondary | ICD-10-CM | POA: Diagnosis not present

## 2016-02-08 DIAGNOSIS — M25551 Pain in right hip: Secondary | ICD-10-CM

## 2016-02-08 DIAGNOSIS — M25561 Pain in right knee: Secondary | ICD-10-CM

## 2016-02-08 NOTE — Therapy (Signed)
[place], [name], [address] Phone: [phone]   Fax:  [phone]  Physical Therapy Treatment  Patient Details  Name: Glenn Robertson MRN: [id number] Date of Birth: May 22, 1966 Referring Provider: Dr Leighton Ruff   Encounter Date: 02/08/2016      PT End of Session - 02/08/16 0741    Visit Number 9   Number of Visits 16   Date for PT Re-Evaluation 02/24/16   PT Start Time 0702   PT Stop Time 0745   PT Time Calculation (min) 43 min   Activity Tolerance Patient tolerated treatment well   Behavior During Therapy [facility] for tasks assessed/performed      Past Medical History  Diagnosis Date  . Diabetes mellitus   . Neuromuscular disorder ([place])   . Allergy   . Asthma   . Chronic pain   . Depression     Past Surgical History  Procedure Laterality Date  . Arm surgery  1/12    rt ulnar nerve decompression  . Epidural block injection      several  . Ulnar nerve transposition  01/19/2012    Procedure: ULNAR NERVE DECOMPRESSION/TRANSPOSITION;  Surgeon: Cammie Sickle., MD;  Location: [place];  Service: Orthopedics;  Laterality: Left;  ulnar nerve decompression vs transposition left elbow    There were no vitals filed for this visit.      Subjective Assessment - 02/08/16 0752    Subjective LT shoulder a little sore but otherwise no pain. He was wondering when MD would let him return to running   Currently in Pain? No/denies            [facility] PT Assessment - 02/08/16 0736    Strength   Right Hip Flexion 5/5   Right Hip Extension 4/5   Right Hip External Rotation  5/5   Right Hip Internal Rotation 5/5   Right Hip ABduction --  5-/5   Right Hip ADduction 5/5   Left Hip Flexion 5/5   Left Hip Extension 4/5   Left Hip External Rotation 5/5   Left Hip Internal Rotation 5/5   Left Hip ABduction 5/5   Left Hip ADduction 5/5                     [facility] Adult PT  Treatment/Exercise - 02/08/16 0709    Knee/Hip Exercises: Aerobic   Recumbent Bike L5  6 min   Knee/Hip Exercises: Standing   Other Standing Knee Exercises Initiated jogging out side 75 feet x10 without knee pain.    Knee/Hip Exercises: Supine   Bridges with Diona Foley Squeeze Strengthening;Both;20 reps  ball squeeze   Bridges with Clamshell Strengthening;Both;20 reps  green band   Single Leg Bridge Right;Left;15 reps     Side steps  With blue band 30 feet x 4 RT and LT           PT Education - 02/08/16 0753    Education provided Yes   Education Details Jog [place]el [place] program and advancement.   I spent much time educating pt on that running may not be appropriate exercise and how toease into this with pain being guide as ti how far and if appropriate.    Person(s) Educated Patient   Methods Explanation;Verbal cues;Handout   Comprehension Returned demonstration;Verbalized understanding          PT Short Term Goals - 02/08/16 0707    PT SHORT TERM GOAL #1   Title  Pt will increase right gross lower extremity strength by 1 grade    Baseline Hip extension 4/5     Status Achieved   PT SHORT TERM GOAL #2   Title Pt will report 2/10 pain at worst    Baseline initiated jogging today short distance without knee or back pain   Status Partially Met   PT SHORT TERM GOAL #3   Title Pt will be I w/ initial HEP    Status Achieved           PT Long Term Goals - 02/08/16 0708    PT LONG TERM GOAL #1   Title Pt will return to the gym with a program for eccentric stregthening and general hip stregthening    Status On-going   PT LONG TERM GOAL #2   Title Pt will return to bike riding without anterior knee pain    Status Achieved   PT LONG TERM GOAL #3   Title Pt will increase right hip strength to 4+/5 in order to improve right lower extremity stability.    PT LONG TERM GOAL #4   Title Oswestry Index improved to minimal self perceived disabilty 20% or less   Status Achieved   PT LONG  TERM GOAL #5   Title Lumbar AROM flexion and extension improved to 70 degrees and 30 degrees needed for greater mobility with transitional movements   Status Achieved               Plan - 02/08/16 0745    Clinical Impression Statement Stength goal met though both hip extension 4/5 he tolerated short jogging bouts without pain. Will try to advance in next few sessions then discharge he met almost all goals. We talked about which exercises  may not be appropriate for him with his specific complaints and need to find what can be done without pain or decide what he wants to do an manage pain with these activities.   Clinical Impairments Affecting Rehab Potential pain complaints   PT Next Visit Plan Contniue to progress jogging if appropriate. and stab exercises and strength,       Patient will benefit from skilled therapeutic intervention in order to improve the following deficits and impairments:  Abnormal gait, Decreased activity tolerance, Decreased endurance, Hypermobility, Decreased strength, Postural dysfunction, Pain  Visit Diagnosis: Muscle weakness (generalized)  Pain in right hip  Pain in right knee  Bilateral low back pain without sciatica     Problem List Patient Active Problem List   Diagnosis Date Noted  . Lumbar degenerative disc disease 12/18/2015  . Subacromial impingement of left shoulder 10/26/2015  . Left lumbar radiculitis 04/21/2015  . HNP (herniated nucleus pulposus), lumbar 04/21/2015  . Chondromalacia of both patellae 02/24/2015  . Paranoid schizophrenia, chronic condition ([place]) 02/24/2015  . Chronic low back pain 07/18/2014  . Knee pain, acute 07/18/2014  . Right ankle pain 01/27/2014  . Great toe pain 01/27/2014  . Sciatica 01/10/2012  . Thoracic or lumbosacral neuritis or radiculitis, unspecified 12/15/2011  . Ulnar neuropathy at elbow 11/14/2011  . Disturbance of skin sensation 11/14/2011    [name] PT 02/08/2016, 8:01 AM  [facility] [address] [place], [name], [address] Phone: [phone]   Fax:  [phone]  Name: Glenn Robertson MRN: [id number] Date of Birth: 1966-05-16

## 2016-02-10 ENCOUNTER — Ambulatory Visit: Payer: Medicare Other

## 2016-02-10 DIAGNOSIS — M25561 Pain in right knee: Secondary | ICD-10-CM

## 2016-02-10 DIAGNOSIS — M6281 Muscle weakness (generalized): Secondary | ICD-10-CM

## 2016-02-10 NOTE — Therapy (Signed)
[place], [name], [address] Phone: [phone]   Fax:  [phone]  Physical Therapy Treatment  Patient Details  Name: Glenn Robertson MRN: [id number] Date of Birth: [birth year] Referring Provider: Dr Leighton Ruff   Encounter Date: 02/10/2016      PT End of Session - 02/10/16 0738    Visit Number 10   Number of Visits 16   Date for PT Re-Evaluation 02/24/16   PT Start Time 0700   PT Stop Time 0740   PT Time Calculation (min) 40 min   Activity Tolerance Patient tolerated treatment well   Behavior During Therapy [facility] for tasks assessed/performed      Past Medical History  Diagnosis Date  . Diabetes mellitus   . Neuromuscular disorder ([place])   . Allergy   . Asthma   . Chronic pain   . Depression     Past Surgical History  Procedure Laterality Date  . Arm surgery  1/12    rt ulnar nerve decompression  . Epidural block injection      several  . Ulnar nerve transposition  01/19/2012    Procedure: ULNAR NERVE DECOMPRESSION/TRANSPOSITION;  Surgeon: Cammie Sickle., MD;  Location: [place];  Service: Orthopedics;  Laterality: Left;  ulnar nerve decompression vs transposition left elbow    There were no vitals filed for this visit.      Subjective Assessment - 02/10/16 0737    Subjective No knee or back pain . Worked out at [employer] today doing [employer]  for LE and UE but avoided deltoid as LT still sore.  Will ride 15 miles today   Currently in Pain? No/denies                         [facility] Adult PT Treatment/Exercise - 02/10/16 0001    Knee/Hip Exercises: Stretches   Passive Hamstring Stretch Right;Left;1 rep;60 seconds   Passive Hamstring Stretch Limitations with strap   Quad Stretch Right;Left;60 seconds;1 rep   Sports administrator Limitations with strap   Piriformis Stretch Right;Left;60 seconds;1 rep   Knee/Hip Exercises: Aerobic   Recumbent Bike L5  6 min    Knee/Hip Exercises: Standing   Other Standing Knee Exercises 75 foot walk at intervals  after jog 75 feet x 2 and jog 150 feet x 10 without knee pain   Knee/Hip Exercises: Supine   Bridges with Ball Squeeze Strengthening;Both;20 reps   Bridges with Clamshell Strengthening;Both;20 reps  holding hips up all reps continuous   Single Leg Bridge Right;Left;20 reps                  PT Short Term Goals - 02/08/16 0707    PT SHORT TERM GOAL #1   Title Pt will increase right gross lower extremity strength by 1 grade    Baseline Hip extension 4/5     Status Achieved   PT SHORT TERM GOAL #2   Title Pt will report 2/10 pain at worst    Baseline initiated jogging today short distance without knee or back pain   Status Partially Met   PT SHORT TERM GOAL #3   Title Pt will be I w/ initial HEP    Status Achieved           PT Long Term Goals - 02/08/16 0708    PT LONG TERM GOAL #1   Title Pt will return to the gym with a program  for eccentric stregthening and general hip stregthening    Status On-going   PT LONG TERM GOAL #2   Title Pt will return to bike riding without anterior knee pain    Status Achieved   PT LONG TERM GOAL #3   Title Pt will increase right hip strength to 4+/5 in order to improve right lower extremity stability.    PT LONG TERM GOAL #4   Title Oswestry Index improved to minimal self perceived disabilty 20% or less   Status Achieved   PT LONG TERM GOAL #5   Title Lumbar AROM flexion and extension improved to 70 degrees and 30 degrees needed for greater mobility with transitional movements   Status Achieved               Plan - Feb 19, 2016 0730    Clinical Impression Statement Mr Cuartas took the [place] today and was rated 40% limited which was worse than initially  at 34% though he is able to jog with nopain short distances, lift weight for LE  and reportedly will do 15 mile bike ride later today. He appears to be very fit and tolerates much exercise  during the day.  He is close to discharge for knee issue and is ready to start walk jog program soon at [facility]   PT Frequency 2x / week   PT Duration 3 weeks   PT Next Visit Plan Contniue to progress jogging.  and strength,at hips    Consulted and Agree with Plan of Care Patient      Patient will benefit from skilled therapeutic intervention in order to improve the following deficits and impairments:  Abnormal gait, Decreased activity tolerance, Decreased endurance, Hypermobility, Decreased strength, Postural dysfunction, Pain  Visit Diagnosis: Muscle weakness (generalized)  Pain in right knee       G-Codes - 02/19/16 0717    Functional Assessment Tool Used FOTO 40%   Functional Limitation Mobility: Walking and moving around   Mobility: Walking and Moving Around Current Status [phone]) At least 40 percent but less than 60 percent impaired, limited or restricted   Mobility: Walking and Moving Around Goal Status [phone]) 0 percent impaired, limited or restricted      Problem List Patient Active Problem List   Diagnosis Date Noted  . Lumbar degenerative disc disease 12/18/2015  . Subacromial impingement of left shoulder 10/26/2015  . Left lumbar radiculitis 04/21/2015  . HNP (herniated nucleus pulposus), lumbar 04/21/2015  . Chondromalacia of both patellae 02/24/2015  . Paranoid schizophrenia, chronic condition ([place]) 02/24/2015  . Chronic low back pain 07/18/2014  . Knee pain, acute 07/18/2014  . Right ankle pain 01/27/2014  . Great toe pain 01/27/2014  . Sciatica 01/10/2012  . Thoracic or lumbosacral neuritis or radiculitis, unspecified 12/15/2011  . Ulnar neuropathy at elbow 11/14/2011  . Disturbance of skin sensation 11/14/2011    [name]  PT February 19, 2016, 7:49 AM  [facility] [address] [place], [name], [address] Phone: [phone]   Fax:  [phone]  Name: Fishel Wamble MRN: [id number] Date of Birth:  Dec 04, 1965

## 2016-02-12 ENCOUNTER — Ambulatory Visit: Payer: Medicare Other

## 2016-02-15 ENCOUNTER — Ambulatory Visit: Payer: Medicare Other

## 2016-02-15 DIAGNOSIS — M25561 Pain in right knee: Secondary | ICD-10-CM

## 2016-02-15 DIAGNOSIS — M6281 Muscle weakness (generalized): Secondary | ICD-10-CM | POA: Diagnosis not present

## 2016-02-15 DIAGNOSIS — M545 Low back pain, unspecified: Secondary | ICD-10-CM

## 2016-02-15 NOTE — Therapy (Signed)
[place], [name], [address] Phone: [phone]   Fax:  [phone]  Physical Therapy Treatment  Patient Details  Name: Glenn Robertson MRN: [id number] Date of Birth: 04-21-1966 Referring Provider: Dr Leighton Ruff   Encounter Date: 02/15/2016      PT End of Session - 02/15/16 0710    Visit Number 11   Number of Visits 16   Date for PT Re-Evaluation 02/24/16   PT Start Time 0710  He was late 10 min   PT Stop Time 0742   PT Time Calculation (min) 32 min   Activity Tolerance Patient tolerated treatment well   Behavior During Therapy [facility] for tasks assessed/performed      Past Medical History  Diagnosis Date  . Diabetes mellitus   . Neuromuscular disorder ([place])   . Allergy   . Asthma   . Chronic pain   . Depression     Past Surgical History  Procedure Laterality Date  . Arm surgery  1/12    rt ulnar nerve decompression  . Epidural block injection      several  . Ulnar nerve transposition  01/19/2012    Procedure: ULNAR NERVE DECOMPRESSION/TRANSPOSITION;  Surgeon: Cammie Sickle., MD;  Location: [place];  Service: Orthopedics;  Laterality: Left;  ulnar nerve decompression vs transposition left elbow    There were no vitals filed for this visit.      Subjective Assessment - 02/15/16 0711    Subjective Reports RT proximal tibia  pain . Not sure why. May have hit it on something. Jogged without pain  this week end. 50 yards max distance. . [facility]  Will try try to ride bike later today.   Currently in Pain? No/denies  except for shin pain                         [facility] Adult PT Treatment/Exercise - 02/15/16 0717    Knee/Hip Exercises: Stretches   Passive Hamstring Stretch Right;Left;1 rep;60 seconds   Quad Stretch Right;Left;60 seconds;1 rep   Other Knee/Hip Stretches Bilat knee to chest x 30 sec post single leg  bridge    Knee/Hip Exercises: Aerobic   Recumbent Bike L5  6 min   Knee/Hip Exercises: Standing   Other Standing Knee Exercises Today he was able to jog 200 yards x 3 with 75 yard walk between without pain.    Knee/Hip Exercises: Supine   Single Leg Bridge Right;Left;20 reps   Other Supine Knee/Hip Exercises Abdonminal crunches with reach to ankle x 20 5 sec hold   Knee/Hip Exercises: Sidelying   Hip ABduction Right;Left;15 reps   Hip ABduction Limitations ith IR/ER  to otuch  table front and behind down leg                  PT Short Term Goals - 02/15/16 0715    PT SHORT TERM GOAL #2   Title Pt will report 2/10 pain at worst    Status Achieved           PT Long Term Goals - 02/15/16 0716    PT LONG TERM GOAL #1   Title Pt will return to the gym with a program for eccentric stregthening and general hip stregthening    Status Achieved   PT LONG TERM GOAL #2   Title Pt will return to bike riding without anterior knee pain  Status Achieved   PT LONG TERM GOAL #4   Title Oswestry Index improved to minimal self perceived disabilty 20% or less   Status Achieved   PT LONG TERM GOAL #5   Title Lumbar AROM flexion and extension improved to 70 degrees and 30 degrees needed for greater mobility with transitional movements   Status Achieved               Plan - 02/15/16 0713    Clinical Impression Statement Mr Spiller was able to successfullly jog without pain so he has progressed toward goal of jogging for exercise but unclear if he will be able to tolerate longer distances without knee or back pain. He tolerated 3 bouts of 200 yards today with good gait pattern. Marland Kitchen He is ready to progres his running program at home I think but will hold that until he returns in 3 days and I will advance if he is able. . . Discharge by end of next week   PT Next Visit Plan Contniue to progress jogging.  and strength,at hips , core    Consulted and Agree with Plan of Care Patient      Patient  will benefit from skilled therapeutic intervention in order to improve the following deficits and impairments:  Abnormal gait, Decreased activity tolerance, Decreased endurance, Hypermobility, Decreased strength, Postural dysfunction, Pain  Visit Diagnosis: Muscle weakness (generalized)  Bilateral low back pain without sciatica  Pain in right knee     Problem List Patient Active Problem List   Diagnosis Date Noted  . Lumbar degenerative disc disease 12/18/2015  . Subacromial impingement of left shoulder 10/26/2015  . Left lumbar radiculitis 04/21/2015  . HNP (herniated nucleus pulposus), lumbar 04/21/2015  . Chondromalacia of both patellae 02/24/2015  . Paranoid schizophrenia, chronic condition ([place]) 02/24/2015  . Chronic low back pain 07/18/2014  . Knee pain, acute 07/18/2014  . Right ankle pain 01/27/2014  . Great toe pain 01/27/2014  . Sciatica 01/10/2012  . Thoracic or lumbosacral neuritis or radiculitis, unspecified 12/15/2011  . Ulnar neuropathy at elbow 11/14/2011  . Disturbance of skin sensation 11/14/2011    [name]  PT 02/15/2016, 7:47 AM  [facility] [address] [place], [name], [address] Phone: [phone]   Fax:  [phone]  Name: Rakin Corkran MRN: [id number] Date of Birth: 17-Apr-1966

## 2016-02-18 ENCOUNTER — Ambulatory Visit: Payer: Medicare Other

## 2016-02-18 DIAGNOSIS — M545 Low back pain, unspecified: Secondary | ICD-10-CM

## 2016-02-18 DIAGNOSIS — M6281 Muscle weakness (generalized): Secondary | ICD-10-CM

## 2016-02-18 DIAGNOSIS — M25561 Pain in right knee: Secondary | ICD-10-CM

## 2016-02-18 NOTE — Patient Instructions (Signed)
Issued from cabinet transvers abdominus , pelvic floor and abdominal prep form pilates book 1-2 xper day 10-15 reps hold 5-10 sec.

## 2016-02-18 NOTE — Therapy (Signed)
[place], [name], [address] Phone: [phone]   Fax:  [phone]  Physical Therapy Treatment  Patient Details  Name: Glenn Robertson MRN: [id number] Date of Birth: 1966-08-22 Referring Provider: Dr Glenn Robertson   Encounter Date: 02/18/2016      PT End of Session - 02/18/16 0712    Visit Number 12   Number of Visits 16   Date for PT Re-Evaluation 02/24/16   PT Start Time 0705   PT Stop Time 0750   PT Time Calculation (min) 45 min   Activity Tolerance Patient tolerated treatment well   Behavior During Therapy [name] for tasks assessed/performed      Past Medical History  Diagnosis Date  . Diabetes mellitus   . Neuromuscular disorder ([place])   . Allergy   . Asthma   . Chronic pain   . Depression     Past Surgical History  Procedure Laterality Date  . Arm surgery  1/12    rt ulnar nerve decompression  . Epidural block injection      several  . Ulnar nerve transposition  01/19/2012    Procedure: ULNAR NERVE DECOMPRESSION/TRANSPOSITION;  Surgeon: Glenn Robertson., MD;  Location: [place];  Service: Orthopedics;  Laterality: Left;  ulnar nerve decompression vs transposition left elbow    There were no vitals filed for this visit.                       [facility] Adult PT Treatment/Exercise - 02/18/16 0001    Knee/Hip Exercises: Aerobic   Nustep L7     8 min LE only   Knee/Hip Exercises: Standing   Other Standing Knee Exercises Today he was able to jog 250 yards x 2 with 75 yard walk between without pain.  Skipping attempt but no able to do correctly and high knees 50 ft x 4, then hopping 4 square x 5 RT and LT then RT to LT hopping single leg x 15 each 2 feet to RT and LT all with good stability   Knee/Hip Exercises: Supine   Other Supine Knee/Hip Exercises abdominal sets with cues to pull lower ribs toward hips hold 10 sec x 12. And pelvic floor lower abdominals.    Knee/Hip Exercises: Sidelying   Other Sidelying Knee/Hip Exercises prone on elbows pland with rotation to songle arm support x10 RT and LT. No complaint of LT shoulder pain.    Knee/Hip Exercises: Prone   Straight Leg Raises Right;Left;20 reps   Straight Leg Raises Limitations swimmwer opposit leg arm lift over 2 pillows cued  to keep leg straight                PT Education - 02/18/16 0754    Education provided Yes   Education Details Abdominal sett in lower core   Person(s) Educated Patient   Methods Explanation;Demonstration;Verbal cues;Handout   Comprehension Returned demonstration;Verbalized understanding          PT Short Term Goals - 02/15/16 0715    PT SHORT TERM GOAL #2   Title Pt will report 2/10 pain at worst    Status Achieved           PT Long Term Goals - 02/18/16 0713    Additional Long Term Goals   Additional Long Term Goals Yes               Plan - 02/18/16 [id number]    Clinical Impression  Statement [name] is doing well with jogging and tolerated hopping without pain. He does continue to have soem abdominal weakness though no pain with core exercises. He may benetfit fome reenforcement for abdominal exercise but is ready to jog at home smartly to not cause knee pain   PT Next Visit Plan  strength,at hips , core  discharge end of next week   Consulted and Agree with Plan of Care Patient      Patient will benefit from skilled therapeutic intervention in order to improve the following deficits and impairments:  Abnormal gait, Decreased activity tolerance, Decreased endurance, Hypermobility, Decreased strength, Postural dysfunction, Pain  Visit Diagnosis: Muscle weakness (generalized)  Bilateral low back pain without sciatica  Pain in right knee     Problem List Patient Active Problem List   Diagnosis Date Noted  . Lumbar degenerative disc disease 12/18/2015  . Subacromial impingement of left shoulder 10/26/2015  . Left lumbar  radiculitis 04/21/2015  . HNP (herniated nucleus pulposus), lumbar 04/21/2015  . Chondromalacia of both patellae 02/24/2015  . Paranoid schizophrenia, chronic condition ([place]) 02/24/2015  . Chronic low back pain 07/18/2014  . Knee pain, acute 07/18/2014  . Right ankle pain 01/27/2014  . Great toe pain 01/27/2014  . Sciatica 01/10/2012  . Thoracic or lumbosacral neuritis or radiculitis, unspecified 12/15/2011  . Ulnar neuropathy at elbow 11/14/2011  . Disturbance of skin sensation 11/14/2011    [name]  PT 02/18/2016, 7:57 AM  [facility] [address] [place], [name], [address] Phone: [phone]   Fax:  [phone]  Name: Glenn Robertson MRN: [id number] Date of Birth: [birth year]

## 2016-02-22 ENCOUNTER — Ambulatory Visit: Payer: Medicare Other

## 2016-02-22 DIAGNOSIS — M545 Low back pain, unspecified: Secondary | ICD-10-CM

## 2016-02-22 DIAGNOSIS — M6281 Muscle weakness (generalized): Secondary | ICD-10-CM | POA: Diagnosis not present

## 2016-02-22 DIAGNOSIS — M25561 Pain in right knee: Secondary | ICD-10-CM

## 2016-02-22 NOTE — Therapy (Signed)
[place], [name], [address] Phone: [phone]   Fax:  [phone]  Physical Therapy Treatment  Patient Details  Name: Glenn Robertson MRN: [id number] Date of Birth: 03/09/1966 Referring Provider: Dr Leighton Ruff   Encounter Date: 02/22/2016      PT End of Session - 02/22/16 0718    Visit Number 13   Number of Visits 16   Date for PT Re-Evaluation 02/24/16   PT Start Time 0703   PT Stop Time 0743   PT Time Calculation (min) 40 min   Activity Tolerance Patient tolerated treatment well   Behavior During Therapy [facility] for tasks assessed/performed      Past Medical History  Diagnosis Date  . Diabetes mellitus   . Neuromuscular disorder ([place])   . Allergy   . Asthma   . Chronic pain   . Depression     Past Surgical History  Procedure Laterality Date  . Arm surgery  1/12    rt ulnar nerve decompression  . Epidural block injection      several  . Ulnar nerve transposition  01/19/2012    Procedure: ULNAR NERVE DECOMPRESSION/TRANSPOSITION;  Surgeon: Cammie Sickle., MD;  Location: [facility];  Service: Orthopedics;  Laterality: Left;  ulnar nerve decompression vs transposition left elbow    There were no vitals filed for this visit.      Subjective Assessment - 02/22/16 0712    Subjective LT hip is sore . PT is not sure why but probably with 15 mile bike ride. May need some stretching   Currently in Pain? Yes   Pain Score 3    Pain Location Hip   Pain Orientation Left   Pain Descriptors / Indicators Sore   Pain Onset In the past 7 days   Pain Frequency Intermittent   Aggravating Factors  from riding bike   Pain Relieving Factors stretch ,rest   Multiple Pain Sites No                         [facility] Adult PT Treatment/Exercise - 02/22/16 0001    Knee/Hip Exercises: Stretches   Passive Hamstring Stretch Right;Left;1 rep;60 seconds   Passive Hamstring Stretch  Limitations with strap   Quad Stretch Right;Left;60 seconds;1 rep   Sports administrator Limitations with strap   Piriformis Stretch Left;30 seconds;2 reps   Knee/Hip Exercises: Aerobic   Recumbent Bike L5  6 min   Stepper L5 6 min   Knee/Hip Exercises: Supine   Single Leg Bridge Right;Left;20 reps   Other Supine Knee/Hip Exercises abdominal sets with cues for transverse abdominus and  to pull lower ribs toward hips hold 10 sec x 12. And pelvic floor lower abdominals.    Knee/Hip Exercises: Sidelying   Hip ABduction Right;Left;15 reps   Hip ABduction Limitations ith IR/ER  to touch  table front and behind down leg   Other Sidelying Knee/Hip Exercises prone on elbows plank with rotation to single arm support x10 RT and LT. No complaint of LT shoulder pain.    Knee/Hip Exercises: Prone   Straight Leg Raises Limitations on elbow and knees SLR x 12 RT and LT  followed by quad stretch 60 sec RT and LT.                   PT Short Term Goals - 02/15/16 0715    PT SHORT TERM GOAL #2   Title Pt  will report 2/10 pain at worst    Status Achieved           PT Long Term Goals - 02/18/16 0713    Additional Long Term Goals   Additional Long Term Goals Yes               Plan - 02/22/16 0721    Clinical Impression Statement Mr Hedding did well with all exercise today. continues with weak abdominals butr otherwise has nortmal strength and does high levels of strength and aerobic activity at home. Last visit next with HEP revies and discharge.    PT Next Visit Plan Review HEP and discharge   Consulted and Agree with Plan of Care Patient      Patient will benefit from skilled therapeutic intervention in order to improve the following deficits and impairments:  Abnormal gait, Decreased activity tolerance, Decreased endurance, Hypermobility, Decreased strength, Postural dysfunction, Pain  Visit Diagnosis: Muscle weakness (generalized)  Bilateral low back pain without sciatica  Pain  in right knee     Problem List Patient Active Problem List   Diagnosis Date Noted  . Lumbar degenerative disc disease 12/18/2015  . Subacromial impingement of left shoulder 10/26/2015  . Left lumbar radiculitis 04/21/2015  . HNP (herniated nucleus pulposus), lumbar 04/21/2015  . Chondromalacia of both patellae 02/24/2015  . Paranoid schizophrenia, chronic condition ([place]) 02/24/2015  . Chronic low back pain 07/18/2014  . Knee pain, acute 07/18/2014  . Right ankle pain 01/27/2014  . Great toe pain 01/27/2014  . Sciatica 01/10/2012  . Thoracic or lumbosacral neuritis or radiculitis, unspecified 12/15/2011  . Ulnar neuropathy at elbow 11/14/2011  . Disturbance of skin sensation 11/14/2011    [name]  PT 02/22/2016, 7:45 AM  [facility] [address] [place], [name], [address] Phone: [phone]   Fax:  [phone]  Name: Wheeler Santangelo MRN: [id number] Date of Birth: 12-Sep-1965

## 2016-02-24 ENCOUNTER — Ambulatory Visit: Payer: Medicare Other

## 2016-02-24 DIAGNOSIS — M545 Low back pain, unspecified: Secondary | ICD-10-CM

## 2016-02-24 DIAGNOSIS — M6281 Muscle weakness (generalized): Secondary | ICD-10-CM

## 2016-02-24 DIAGNOSIS — M25551 Pain in right hip: Secondary | ICD-10-CM

## 2016-02-24 DIAGNOSIS — M25561 Pain in right knee: Secondary | ICD-10-CM

## 2016-02-24 NOTE — Therapy (Addendum)
[place], [name], [address] Phone: [phone]   Fax:  [phone]  Physical Therapy Treatment/Discharge  Patient Details  Name: Glenn Robertson MRN: [id number] Date of Birth: 1965/12/07 Referring Provider: Dr Leighton Ruff   Encounter Date: 02/24/2016      PT End of Session - 02/24/16 0709    Visit Number 14   Date for PT Re-Evaluation 02/24/16   Authorization Type medicare/medicaid    PT Start Time 0703   PT Stop Time 0758   PT Time Calculation (min) 55 min   Activity Tolerance Patient tolerated treatment well   Behavior During Therapy [facility] for tasks assessed/performed      Past Medical History  Diagnosis Date  . Diabetes mellitus   . Neuromuscular disorder ([place])   . Allergy   . Asthma   . Chronic pain   . Depression     Past Surgical History  Procedure Laterality Date  . Arm surgery  1/12    rt ulnar nerve decompression  . Epidural block injection      several  . Ulnar nerve transposition  01/19/2012    Procedure: ULNAR NERVE DECOMPRESSION/TRANSPOSITION;  Surgeon: Cammie Sickle., MD;  Location: [facility];  Service: Orthopedics;  Laterality: Left;  ulnar nerve decompression vs transposition left elbow    There were no vitals filed for this visit.          [facility] PT Assessment - 02/24/16 0001    Palpation   SI assessment  No significant asymetry in sacrum or pelvis.                      [facility] Adult PT Treatment/Exercise - 02/24/16 0001    Knee/Hip Exercises: Standing   Other Standing Knee Exercises jogged 400 feet without increased symptoms   Knee/Hip Exercises: Supine   Bridges Limitations 10 reps   Other Supine Knee/Hip Exercises abdominal sets with cues for transverse abdominus and  to pull lower ribs toward hips hold 10 sec x 12. And pelvic floor lower abdominals.    Knee/Hip Exercises: Sidelying   Hip ABduction Right;Left;15 reps   Clams green  band at knees x 20 RT and LT.   Knee/Hip Exercises: Prone   Straight Leg Raises Limitations on elbow and knees hip ext with knee bent and straight x 12 each RT and LT Side steps  x 12 RT and LT    Manual Therapy   Manual therapy comments Did a MET for post LT ilia and rotation releases to Lt gluteals without change in pain though range increased  with Ir and knee flexion and ASIS level                    PT Short Term Goals - 02/15/16 0715    PT SHORT TERM GOAL #2   Title Pt will report 2/10 pain at worst    Status Achieved           PT Long Term Goals - 02/24/16 0758    PT LONG TERM GOAL #1   Title Pt will return to the gym with a program for eccentric stregthening and general hip stregthening    Status Achieved   PT LONG TERM GOAL #2   Title Pt will return to bike riding without anterior knee pain    Status Achieved   PT LONG TERM GOAL #4   Title Oswestry Index improved to minimal self perceived disabilty  20% or less   Status Achieved   PT LONG TERM GOAL #5   Title Lumbar AROM flexion and extension improved to 70 degrees and 30 degrees needed for greater mobility with transitional movements   Status Achieved   PT LONG TERM GOAL #6   Title Patient will have core and LE strength for lifting light to medium weight object 10# for household chores   Status Achieved               Plan - 02/24/16 0709    Clinical Impression Statement Glenn Robertson reports onset of scatica. Otherwise he is able to do HEP correctly including core exercise. He does exetensive exercise at gym and bike riding /and was able to jog without pain prior to onset of this pain. He has stopped exercise  but after doing program today it appears he should comntinue exercise as long as he does not have increased leg pain. I don't think he understands the need to limit activity when he feels symptoms coming on as he relates doing exercise until pain gets bad then stops. He  expects to be able to have a  high level of exercise without pain. With his ongoing / long term pain complaints I don't think this is realistic.  His is discharged today with HEP/  I asked him to contact MD about if injection appropriate in back and to ask what he feels is most appropriate exercise and what to avoid.       PT Next Visit Plan Discharge with HEP   Consulted and Agree with Plan of Care Patient      Patient will benefit from skilled therapeutic intervention in order to improve the following deficits and impairments:  Abnormal gait, Decreased activity tolerance, Decreased endurance, Hypermobility, Decreased strength, Postural dysfunction, Pain  Visit Diagnosis: Muscle weakness (generalized)  Bilateral low back pain without sciatica  Pain in right knee  Pain in right hip     Problem List Patient Active Problem List   Diagnosis Date Noted  . Lumbar degenerative disc disease 12/18/2015  . Subacromial impingement of left shoulder 10/26/2015  . Left lumbar radiculitis 04/21/2015  . HNP (herniated nucleus pulposus), lumbar 04/21/2015  . Chondromalacia of both patellae 02/24/2015  . Paranoid schizophrenia, chronic condition ([place]) 02/24/2015  . Chronic low back pain 07/18/2014  . Knee pain, acute 07/18/2014  . Right ankle pain 01/27/2014  . Great toe pain 01/27/2014  . Sciatica 01/10/2012  . Thoracic or lumbosacral neuritis or radiculitis, unspecified 12/15/2011  . Ulnar neuropathy at elbow 11/14/2011  . Disturbance of skin sensation 11/14/2011    [name]  PT 02/24/2016, 7:59 AM  [facility] [address] [place], [name], [address] Phone: [phone]   Fax:  [phone]  Name: Glenn Robertson MRN: [id number] Date of Birth: March 06, 1966  PHYSICAL THERAPY DISCHARGE SUMMARY  Visits from Start of Care: 14  Current functional level related to goals / functional outcomes: See above   Remaining deficits: See above. He reports LT hip and  post leg pain he relates to previous episodes of sciatica. Otherwise except with some core /abdominal weakness addressed with HEP he is very fit exercising on a regular basis with aerobic exercise lifting weight at gym and HEP   Education / Equipment: HEP Plan: Patient agrees to discharge.  Patient goals were met. Patient is being discharged due to meeting the stated rehab goals.  ?????

## 2016-02-24 NOTE — Patient Instructions (Signed)

## 2016-03-02 ENCOUNTER — Encounter: Payer: Self-pay | Admitting: Sports Medicine

## 2016-03-02 ENCOUNTER — Ambulatory Visit (INDEPENDENT_AMBULATORY_CARE_PROVIDER_SITE_OTHER): Payer: Medicare Other | Admitting: Sports Medicine

## 2016-03-02 VITALS — BP 110/70 | Ht 70.0 in | Wt 200.0 lb

## 2016-03-02 DIAGNOSIS — M2242 Chondromalacia patellae, left knee: Secondary | ICD-10-CM

## 2016-03-02 DIAGNOSIS — M5432 Sciatica, left side: Secondary | ICD-10-CM

## 2016-03-02 DIAGNOSIS — M2241 Chondromalacia patellae, right knee: Secondary | ICD-10-CM | POA: Diagnosis not present

## 2016-03-02 NOTE — Progress Notes (Signed)
Patient ID: Glenn Robertson, male   DOB: 06-14-1966, 50 y.o.   MRN: [id number]  Patient returns in followup of bilateral knee pain He has a history of paranoid schizophrenia and some anxiety as well as obsessions This has been relatively well controlled He likes to exercise as it is a stress reliever He would like to run a 5K but has had knee problems every time he tries to start a program  When evaluated at sports medicine we felt more of his problems were related to patellofemoral symptoms  He has completed a course of physical therapy With that he has done a fairly high level of exercise with no significant pain  Problem #2 is his left-sided sciatica This is occasionally bothered him over the last week Not severe at this time No feeling of weakness  Review of systems Denies nighttime pain Denies any swelling in his knees  Physical exam Muscular black male in no acute distress/ some persistent questioning BP 110/70 mmHg  Ht 5\' 10"  (1.778 m)  Wt 200 lb (90.719 kg)  BMI 28.70 kg/m2   Bilat Knee: Normal to inspection with no erythema or effusion or obvious bony abnormalities. Palpation normal with no warmth or joint line tenderness or patellar tenderness or condyle tenderness. ROM normal in flexion and extension and lower leg rotation. Ligaments with solid consistent endpoints including ACL, PCL, LCL, MCL. Negative Mcmurray's and provocative meniscal tests. Non painful patellar compression. He does get some crepitation have his kneecaps more on the left side Patellar and quadriceps tendons unremarkable. Hamstring and quadriceps strength is normal.  Knee to chest flexion Back bridges Back extension      are all non-painful  Straight leg raise is negative to 80 on the left with no sciatica

## 2016-03-02 NOTE — Assessment & Plan Note (Signed)
While he has bilateral patellofemoral symptoms I think he has very functional knees  He is very anxious because someone has told him that he has arthritis in the past  I reassured him that he can gradually increase his training program and get back into running

## 2016-03-02 NOTE — Patient Instructions (Addendum)
Let's start the walking to 5K schedule  Week 1 and 2:  Walk 5 minutes then run 30 secs and walk 1 min for a total of 20 mins./ 5 mins cool down  Weeks 3 and 4 : 5 min walk and then 1 min run/ 1 mins walk 20 mins/ 5 min cool down  Weeks 5 and 6:  5 min walk then 2 min run and 2 min walk for total of 24 mins/ 5 min cool down  Then gradually progress your running as long as no severe pain  Keep up knee exercises as suggested by PT  For the Sciatica Knee to chest Knee to shoulder Pelvic tilts Add some planks and bridges  Check with me in 3 months

## 2016-03-02 NOTE — Assessment & Plan Note (Signed)
He is having only mild symptoms  I discouraged him from going for an injection at this time  He has not been doing any of his back exercises and I would like to restart him on those  Recheck with me in 3 months

## 2016-03-15 ENCOUNTER — Encounter: Payer: Medicare Other | Attending: Physical Medicine & Rehabilitation

## 2016-03-15 ENCOUNTER — Encounter: Payer: Self-pay | Admitting: Physical Medicine & Rehabilitation

## 2016-03-15 ENCOUNTER — Ambulatory Visit (HOSPITAL_BASED_OUTPATIENT_CLINIC_OR_DEPARTMENT_OTHER): Payer: Medicare Other | Admitting: Physical Medicine & Rehabilitation

## 2016-03-15 VITALS — BP 112/77 | HR 78 | Resp 18

## 2016-03-15 DIAGNOSIS — M5417 Radiculopathy, lumbosacral region: Secondary | ICD-10-CM | POA: Diagnosis not present

## 2016-03-15 DIAGNOSIS — M25561 Pain in right knee: Secondary | ICD-10-CM | POA: Diagnosis not present

## 2016-03-15 DIAGNOSIS — M545 Low back pain: Secondary | ICD-10-CM | POA: Diagnosis not present

## 2016-03-15 DIAGNOSIS — G8929 Other chronic pain: Secondary | ICD-10-CM | POA: Insufficient documentation

## 2016-03-15 NOTE — Patient Instructions (Addendum)

## 2016-03-15 NOTE — Progress Notes (Signed)
Left S1 transforaminal epidural steroid injection under fluoroscopic guidance  Indication: Lumbosacral radiculitis is not relieved by medication management or other conservative care and interfering with self-care and mobility.   Informed consent was obtained after describing risk and benefits of the procedure with the patient, this includes bleeding, bruising, infection, paralysis and medication side effects.  The patient wishes to proceed and has given written consent.  Patient was placed in prone position.  The lumbar area was marked and prepped with Betadine.  It was entered with a 25-gauge 1-1/2 inch needle and one mL of 1% lidocaine was injected into the skin and subcutaneous tissue.  Then a 22-gauge 3.5in spinal needle was inserted into the Left S1  sacral foramen under AP, lateral, and oblique view.  Then a solution containing one mL of 10 mg per mL dexamethasone and 2 mL of 1% lidocaine was injected.  The patient tolerated procedure well.  Post procedure instructions were given.  Please see post procedure form.

## 2016-03-15 NOTE — Progress Notes (Signed)
  PROCEDURE RECORD [facility]   Name: Glenn Robertson DOB:[birth year] MRN: [id number]  Date:03/15/2016  Physician: Alysia Penna, MD    Nurse/CMA: Gara Kroner, CMA  Allergies:  Allergies  Allergen Reactions  . Food     Oranges or anything with citric acid    Consent Signed: Yes.    Is patient diabetic? Yes.    CBG today? 104  Pregnant: No. LMP: No LMP for male patient. (age 49-55)  Anticoagulants: no Anti-inflammatory: no Antibiotics: no  Procedure: S1 Epidural Steroid Injection Position: Prone Start Time: 1052  End Time: 1056 Fluoro Time: 16  RN/CMA [employer], CMA    Time 1036     BP 112/77 120/80    Pulse 79 81    Respirations 18 18    O2 Sat 96 96    S/S 6 6    Pain Level 1/10 1/10     D/C home with sister-in-law, patient A & O X 3, D/C instructions reviewed, and sits independently.

## 2016-04-04 ENCOUNTER — Telehealth: Payer: Self-pay | Admitting: Physical Medicine & Rehabilitation

## 2016-04-04 NOTE — Telephone Encounter (Signed)
Patient wanted exercises for his plantar fascitis.  I called patient and printed off a copy of his previous instructions for this from 03/05/12 visit.  He will be by office this afternoon to pick it up.

## 2016-04-18 ENCOUNTER — Encounter: Payer: Self-pay | Admitting: Sports Medicine

## 2016-04-18 ENCOUNTER — Ambulatory Visit (INDEPENDENT_AMBULATORY_CARE_PROVIDER_SITE_OTHER): Payer: Medicare Other | Admitting: Sports Medicine

## 2016-04-18 DIAGNOSIS — M2242 Chondromalacia patellae, left knee: Secondary | ICD-10-CM | POA: Diagnosis not present

## 2016-04-18 DIAGNOSIS — M25571 Pain in right ankle and joints of right foot: Secondary | ICD-10-CM | POA: Diagnosis present

## 2016-04-18 DIAGNOSIS — M2241 Chondromalacia patellae, right knee: Secondary | ICD-10-CM | POA: Diagnosis not present

## 2016-04-18 NOTE — Progress Notes (Signed)
CC: Rt lat foot pain/ Left sciatica  HPI Pateint trying to get back into walking RT lat foot pain He has OTC orthotics that are comfortable and motion control shoes  Would like to run Still has left sciatica Left HS and calf get tight with running or walking Biking OK  Past HX Schizophrenia - now under control DM2 on oral meds  ROS No numbness into either foot No injury to RT foot No swelling in either foot  PE NAD BP 117/73   Ht 5\' 10"  (1.778 m)   Wt 200 lb (90.7 kg)   BMI 28.70 kg/m   Feet -pes cavus with pronation R > L No TTP RT foot No swelling Gait - he pronates after striking in supination Rapid midfoot rotation  HS flexib is good MM strength is exc No sciatica on SLR

## 2016-04-26 ENCOUNTER — Ambulatory Visit: Payer: Medicare Other | Admitting: Sports Medicine

## 2016-05-03 ENCOUNTER — Encounter: Payer: Self-pay | Admitting: Sports Medicine

## 2016-05-03 ENCOUNTER — Ambulatory Visit (INDEPENDENT_AMBULATORY_CARE_PROVIDER_SITE_OTHER): Payer: Medicare Other | Admitting: Sports Medicine

## 2016-05-03 DIAGNOSIS — M25571 Pain in right ankle and joints of right foot: Secondary | ICD-10-CM | POA: Diagnosis present

## 2016-05-03 NOTE — Assessment & Plan Note (Signed)
This still seems to relate to his loss of arches both longitudinal and transverse I suspect his gait cycle place is too much pressure on his fifth MTP It may be that his over-the-counter orthotics that are now 49 years old have inadequate cushioned We placed him in sports insoles Right lateral column post Bilateral scaphoid pad  He felt more comfortable walking in these and had a neutral gait  We will try these for one month and continue if they're helping

## 2016-05-03 NOTE — Progress Notes (Signed)
CC: RT Foot pain  Right plantar fasciitis by history a few years ago Since then he has had more right foot pain He was put into a good over-the-counter orthotic this helped until the past 6 months  Now has right lateral forefoot pain No swelling No history of gout Positive history for lumbosacral radiculopathy  Past medical history Paranoid schizophrenia Numerous musculoskeletal complaints as documented  Review of systems No numbness in his feet No warmth No sciatic symptoms today  Physical examination Somewhat anxious black male in no acute distress BP 120/76   Ht 5\' 10"  (1.778 m)   Wt 200 lb (90.7 kg)   BMI 28.70 kg/m   Feet show loss of the longitudinal arch He localizes pain over the right fifth MTP joint No MTP joints are swollen Transverse arch is also flat No abnormal callusing Manipulation of right lateral foot causes no pain

## 2016-05-06 ENCOUNTER — Other Ambulatory Visit: Payer: Self-pay | Admitting: *Deleted

## 2016-05-06 MED ORDER — GABAPENTIN 300 MG PO CAPS: 600.0000 mg | ORAL_CAPSULE | Freq: Two times a day (BID) | ORAL | 1 refills | Status: DC

## 2016-05-26 ENCOUNTER — Ambulatory Visit: Payer: Medicare Other | Admitting: Sports Medicine

## 2016-06-16 ENCOUNTER — Encounter: Payer: Medicare Other | Attending: Physical Medicine & Rehabilitation

## 2016-06-16 ENCOUNTER — Ambulatory Visit (HOSPITAL_BASED_OUTPATIENT_CLINIC_OR_DEPARTMENT_OTHER): Payer: Medicare Other | Admitting: Physical Medicine & Rehabilitation

## 2016-06-16 ENCOUNTER — Encounter: Payer: Self-pay | Admitting: Physical Medicine & Rehabilitation

## 2016-06-16 VITALS — BP 123/79 | HR 107 | Resp 14

## 2016-06-16 DIAGNOSIS — M5417 Radiculopathy, lumbosacral region: Secondary | ICD-10-CM

## 2016-06-16 DIAGNOSIS — M545 Low back pain: Secondary | ICD-10-CM | POA: Diagnosis not present

## 2016-06-16 DIAGNOSIS — G8929 Other chronic pain: Secondary | ICD-10-CM | POA: Diagnosis not present

## 2016-06-16 DIAGNOSIS — M25561 Pain in right knee: Secondary | ICD-10-CM | POA: Insufficient documentation

## 2016-06-16 NOTE — Progress Notes (Signed)
  PROCEDURE RECORD [facility]   Name: Glenn Robertson DOB:December 18, 1965 MRN: [id number]  Date:06/16/2016  Physician: Alysia Penna, MD    Nurse/CMA: Kendi Defalco, CMA  Allergies:  Allergies  Allergen Reactions  . Food     Oranges or anything with citric acid    Consent Signed: Yes.    Is patient diabetic? Yes.    CBG today? ?  Pregnant: No. LMP: No LMP for male patient. (age 49-55)  Anticoagulants: no Anti-inflammatory: no Antibiotics: no  Procedure: left S1 transforaminal epidural steroid injection  Position: Prone Start Time:11:48am   End Time: 11:53am  Fluoro Time: 18  RN/CMA Renne Cornick, CMA Samaria Anes, CMA    Time 11:15 am 11:57am    BP 123/79 118/77    Pulse 107 107    Respirations 14 14    O2 Sat 97 96    S/S 6 6    Pain Level 4/10 4/10     D/C home with Claudia (sister-in-law), patient A & O X 3, D/C instructions reviewed, and sits independently.

## 2016-06-16 NOTE — Progress Notes (Signed)
Left S1 transforaminal epidural steroid injection under fluoroscopic guidance  Indication: Lumbosacral radiculitis is not relieved by medication management or other conservative care and interfering with self-care and mobility.   Informed consent was obtained after describing risk and benefits of the procedure with the patient, this includes bleeding, bruising, infection, paralysis and medication side effects.  The patient wishes to proceed and has given written consent.  Patient was placed in prone position.  The lumbar area was marked and prepped with Betadine.  It was entered with a 25-gauge 1-1/2 inch needle and one mL of 1% lidocaine was injected into the skin and subcutaneous tissue.  Then a 22-gauge 3.5in spinal needle was inserted into the Left S1  sacral foramen under AP, lateral, and oblique view.  Then a solution containing one mL of 10 mg per mL dexamethasone and 2 mL of 1% lidocaine was injected.  The patient tolerated procedure well.  Post procedure instructions were given.  Please see post procedure form.

## 2016-06-16 NOTE — Patient Instructions (Signed)

## 2016-07-11 ENCOUNTER — Other Ambulatory Visit: Payer: Self-pay | Admitting: Physical Medicine & Rehabilitation

## 2016-07-12 ENCOUNTER — Encounter: Payer: Medicare Other | Attending: Physical Medicine & Rehabilitation

## 2016-07-12 ENCOUNTER — Encounter: Payer: Self-pay | Admitting: Physical Medicine & Rehabilitation

## 2016-07-12 ENCOUNTER — Ambulatory Visit (HOSPITAL_BASED_OUTPATIENT_CLINIC_OR_DEPARTMENT_OTHER): Payer: Medicare Other | Admitting: Physical Medicine & Rehabilitation

## 2016-07-12 DIAGNOSIS — G8929 Other chronic pain: Secondary | ICD-10-CM | POA: Insufficient documentation

## 2016-07-12 DIAGNOSIS — M25561 Pain in right knee: Secondary | ICD-10-CM | POA: Diagnosis not present

## 2016-07-12 DIAGNOSIS — M25532 Pain in left wrist: Secondary | ICD-10-CM | POA: Diagnosis not present

## 2016-07-12 DIAGNOSIS — M545 Low back pain: Secondary | ICD-10-CM | POA: Diagnosis present

## 2016-07-12 NOTE — Patient Instructions (Signed)
You have a hypertrophied abductor, pollicis longus muscle  Left wrist pain after fall suspect either ulnar styloid process contusion or fracture versus scapholunate dissociation
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
## 2016-07-19 ENCOUNTER — Telehealth: Payer: Self-pay | Admitting: *Deleted

## 2016-07-19 ENCOUNTER — Ambulatory Visit
Admission: RE | Admit: 2016-07-19 | Discharge: 2016-07-19 | Disposition: A | Discharge status: Home / Self Care | Payer: Medicare Other | Source: Ambulatory Visit | Attending: Physical Medicine & Rehabilitation | Admitting: Physical Medicine & Rehabilitation

## 2016-07-19 DIAGNOSIS — M25532 Pain in left wrist: Secondary | ICD-10-CM

## 2016-07-19 NOTE — Telephone Encounter (Signed)
Glenn Robertson called to ask when they were going to call him about scheduling his wrist xray.  I left him a voicemail that they will not call, he just goes to [facility] [address] and the order is in the system.

## 2016-07-26 ENCOUNTER — Ambulatory Visit (HOSPITAL_BASED_OUTPATIENT_CLINIC_OR_DEPARTMENT_OTHER): Payer: Medicare Other | Admitting: Physical Medicine & Rehabilitation

## 2016-07-26 ENCOUNTER — Encounter: Payer: Self-pay | Admitting: Physical Medicine & Rehabilitation

## 2016-07-26 VITALS — BP 119/81 | HR 106 | Resp 14

## 2016-07-26 DIAGNOSIS — M25532 Pain in left wrist: Secondary | ICD-10-CM | POA: Diagnosis not present

## 2016-07-26 DIAGNOSIS — M545 Low back pain: Secondary | ICD-10-CM | POA: Diagnosis not present

## 2016-07-26 NOTE — Patient Instructions (Signed)
May Have cartilage tear in Left wrist, check MRI
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
## 2016-07-29 ENCOUNTER — Telehealth: Payer: Self-pay

## 2016-07-29 NOTE — Telephone Encounter (Signed)
Patient has been notified and understood

## 2016-07-29 NOTE — Telephone Encounter (Signed)
Notify pt that one is ordered and will need to be approved by insurance, [facility] imaging will be scheduling

## 2016-07-29 NOTE — Telephone Encounter (Signed)
Patient called Stating he "would like an  MRI of his hand/wrist if possible. He has been having increasing pain". However, he does have a recent order for an MRI.  Please advise.

## 2016-08-08 ENCOUNTER — Telehealth: Payer: Self-pay

## 2016-08-08 NOTE — Telephone Encounter (Signed)
Patient called this monring, states has still not been contacted by MRI scheduling yet.

## 2016-08-15 ENCOUNTER — Ambulatory Visit: Payer: Medicare Other | Admitting: Physical Medicine & Rehabilitation

## 2016-08-19 ENCOUNTER — Ambulatory Visit
Admission: RE | Admit: 2016-08-19 | Discharge: 2016-08-19 | Disposition: A | Discharge status: Home / Self Care | Payer: Medicare Other | Source: Ambulatory Visit | Attending: Physical Medicine & Rehabilitation | Admitting: Physical Medicine & Rehabilitation

## 2016-08-19 DIAGNOSIS — M25532 Pain in left wrist: Secondary | ICD-10-CM

## 2016-09-02 ENCOUNTER — Encounter: Payer: Self-pay | Admitting: Physical Medicine & Rehabilitation

## 2016-09-02 ENCOUNTER — Encounter: Payer: Medicare Other | Attending: Physical Medicine & Rehabilitation

## 2016-09-02 ENCOUNTER — Ambulatory Visit (HOSPITAL_BASED_OUTPATIENT_CLINIC_OR_DEPARTMENT_OTHER): Payer: Medicare Other | Admitting: Physical Medicine & Rehabilitation

## 2016-09-02 VITALS — BP 113/77 | HR 90 | Resp 14

## 2016-09-02 DIAGNOSIS — G8929 Other chronic pain: Secondary | ICD-10-CM | POA: Diagnosis not present

## 2016-09-02 DIAGNOSIS — M5417 Radiculopathy, lumbosacral region: Secondary | ICD-10-CM | POA: Diagnosis not present

## 2016-09-02 DIAGNOSIS — M25561 Pain in right knee: Secondary | ICD-10-CM | POA: Diagnosis not present

## 2016-09-02 DIAGNOSIS — M25532 Pain in left wrist: Secondary | ICD-10-CM | POA: Diagnosis not present

## 2016-09-02 DIAGNOSIS — M545 Low back pain: Secondary | ICD-10-CM | POA: Insufficient documentation

## 2016-09-02 MED ORDER — GABAPENTIN 300 MG PO CAPS: 600.0000 mg | ORAL_CAPSULE | Freq: Two times a day (BID) | ORAL | 2 refills | Status: DC

## 2016-09-02 NOTE — Patient Instructions (Signed)
Please call if your sciatic symptoms act up and you need to schedule epidural injection prior to March
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
## 2016-09-06 DIAGNOSIS — R682 Dry mouth, unspecified: Secondary | ICD-10-CM | POA: Diagnosis not present

## 2016-09-06 DIAGNOSIS — E119 Type 2 diabetes mellitus without complications: Secondary | ICD-10-CM | POA: Diagnosis not present

## 2016-09-06 DIAGNOSIS — E538 Deficiency of other specified B group vitamins: Secondary | ICD-10-CM | POA: Diagnosis not present

## 2016-09-06 DIAGNOSIS — M79643 Pain in unspecified hand: Secondary | ICD-10-CM | POA: Diagnosis not present

## 2016-09-07 DIAGNOSIS — M792 Neuralgia and neuritis, unspecified: Secondary | ICD-10-CM | POA: Diagnosis not present

## 2016-09-15 ENCOUNTER — Ambulatory Visit: Payer: Medicare Other | Admitting: Physical Medicine & Rehabilitation

## 2016-09-27 DIAGNOSIS — R202 Paresthesia of skin: Secondary | ICD-10-CM | POA: Diagnosis not present

## 2016-09-27 DIAGNOSIS — R201 Hypoesthesia of skin: Secondary | ICD-10-CM | POA: Diagnosis not present

## 2016-09-27 DIAGNOSIS — M79671 Pain in right foot: Secondary | ICD-10-CM | POA: Diagnosis not present

## 2016-09-27 DIAGNOSIS — M5417 Radiculopathy, lumbosacral region: Secondary | ICD-10-CM | POA: Diagnosis not present

## 2016-09-27 DIAGNOSIS — E538 Deficiency of other specified B group vitamins: Secondary | ICD-10-CM | POA: Diagnosis not present

## 2016-09-27 DIAGNOSIS — G3184 Mild cognitive impairment, so stated: Secondary | ICD-10-CM | POA: Diagnosis not present

## 2016-09-27 DIAGNOSIS — R413 Other amnesia: Secondary | ICD-10-CM | POA: Diagnosis not present

## 2016-09-27 DIAGNOSIS — R634 Abnormal weight loss: Secondary | ICD-10-CM | POA: Diagnosis not present

## 2016-09-27 DIAGNOSIS — G5601 Carpal tunnel syndrome, right upper limb: Secondary | ICD-10-CM | POA: Diagnosis not present

## 2016-09-27 DIAGNOSIS — E531 Pyridoxine deficiency: Secondary | ICD-10-CM | POA: Diagnosis not present

## 2016-09-27 DIAGNOSIS — G609 Hereditary and idiopathic neuropathy, unspecified: Secondary | ICD-10-CM | POA: Diagnosis not present

## 2016-09-27 DIAGNOSIS — G603 Idiopathic progressive neuropathy: Secondary | ICD-10-CM | POA: Diagnosis not present

## 2016-09-28 DIAGNOSIS — F200 Paranoid schizophrenia: Secondary | ICD-10-CM | POA: Diagnosis not present

## 2016-10-05 DIAGNOSIS — F329 Major depressive disorder, single episode, unspecified: Secondary | ICD-10-CM | POA: Diagnosis not present

## 2016-10-19 DIAGNOSIS — G3184 Mild cognitive impairment, so stated: Secondary | ICD-10-CM | POA: Diagnosis not present

## 2016-10-19 DIAGNOSIS — M5442 Lumbago with sciatica, left side: Secondary | ICD-10-CM | POA: Diagnosis not present

## 2016-10-19 DIAGNOSIS — G5601 Carpal tunnel syndrome, right upper limb: Secondary | ICD-10-CM | POA: Diagnosis not present

## 2016-10-19 DIAGNOSIS — M5441 Lumbago with sciatica, right side: Secondary | ICD-10-CM | POA: Diagnosis not present

## 2016-10-19 DIAGNOSIS — G603 Idiopathic progressive neuropathy: Secondary | ICD-10-CM | POA: Diagnosis not present

## 2016-10-28 ENCOUNTER — Encounter: Payer: Self-pay | Admitting: Family Medicine

## 2016-10-28 ENCOUNTER — Encounter (INDEPENDENT_AMBULATORY_CARE_PROVIDER_SITE_OTHER): Payer: Self-pay

## 2016-10-28 ENCOUNTER — Ambulatory Visit (INDEPENDENT_AMBULATORY_CARE_PROVIDER_SITE_OTHER): Payer: Medicare Other | Admitting: Family Medicine

## 2016-10-28 DIAGNOSIS — M79669 Pain in unspecified lower leg: Secondary | ICD-10-CM

## 2016-10-28 DIAGNOSIS — M79605 Pain in left leg: Secondary | ICD-10-CM | POA: Insufficient documentation

## 2016-10-28 NOTE — Progress Notes (Signed)
  Glenn Robertson - 51 y.o. male MRN [id number]  Date of birth: Aug 26, 1966  SUBJECTIVE:  Including CC & ROS.   Glenn Robertson is a 51 year old male that is presenting with bilateral anterior leg pain in his lower extremities. This is been acutely changed for past 3 weeks. He feels the pain when he is shortly into his run. He has the pain while he is running. The pain resolves shortly after he is done running. He denies any problems like this in the past. He has to wear ankle braces due to overpronation of his feet he reports. He denies any prior injury or inciting event. This started gradual nature. He has not tried any medications.  ROS: No unexpected weight loss, fever, chills, swelling, instability, numbness/tingling, redness, otherwise see HPI    HISTORY: Past Medical, Surgical, Social, and Family History Reviewed & Updated per EMR.   Pertinent Historical Findings include: PMSHx -  bilateral ulnar nerve transposition, paranoid schizophrenia PSHx -  Never smoker  FHx -  Dm2  DATA REVIEWED: None to review  PHYSICAL EXAM:  VS: BP:114/70  HR: bpm  TEMP: ( )  RESP:   HT:5\' 10"  (177.8 cm)   WT:200 lb (90.7 kg)  BMI:28.8 PHYSICAL EXAM: Gen: NAD, alert, cooperative with exam,  HEENT: clear conjunctiva, EOMI CV:  no edema, capillary refill brisk,  Resp: non-labored, normal speech Skin: no rashes, normal turgor  Neuro: no gross deficits.  Psych:  alert and oriented Lower extremities: No area of specific tenderness over the palpation of the anterior tibia. Normal knee flexion and extension. Normal ankle range of motion. Normal strength to resistance. Significant pes planus Neurovascularly intact distally Gait: Walking with braces allows him to not have pronation. Unable to reproduce symptoms with walking in the hallway today.   ASSESSMENT & PLAN:   Pain in shin Possible that he has shin splints occurring. Unclear as to why this is developing. Possible he has chronic exertional  compartment syndrome. Could be associated with his flat feet. He does have some orthotics but they're not cushioned. - Provided scaphoid pads in each of his orthotics - Provided home exercises with Theragran. - Advised to follow-up in 4 weeks. If no improvement may need to consider formal physical therapy versus compartment testing.

## 2016-10-28 NOTE — Progress Notes (Signed)
[facility]: Attending Note: I have reviewed the chart, discussed wit the Sports Medicine Fellow. I agree with assessment and treatment plan as detailed in the Fellow's note.  

## 2016-10-28 NOTE — Assessment & Plan Note (Signed)
Possible that he has shin splints occurring. Unclear as to why this is developing. Possible he has chronic exertional compartment syndrome. Could be associated with his flat feet. He does have some orthotics but they're not cushioned. - Provided scaphoid pads in each of his orthotics - Provided home exercises with Theragran. - Advised to follow-up in 4 weeks. If no improvement may need to consider formal physical therapy versus compartment testing.

## 2016-11-03 ENCOUNTER — Encounter: Payer: Self-pay | Admitting: Physical Medicine & Rehabilitation

## 2016-11-03 ENCOUNTER — Ambulatory Visit (HOSPITAL_BASED_OUTPATIENT_CLINIC_OR_DEPARTMENT_OTHER): Payer: Medicare Other | Admitting: Physical Medicine & Rehabilitation

## 2016-11-03 ENCOUNTER — Encounter: Payer: Medicare Other | Attending: Physical Medicine & Rehabilitation

## 2016-11-03 VITALS — BP 115/79 | HR 101

## 2016-11-03 DIAGNOSIS — M545 Low back pain: Secondary | ICD-10-CM | POA: Diagnosis not present

## 2016-11-03 DIAGNOSIS — M25561 Pain in right knee: Secondary | ICD-10-CM | POA: Diagnosis not present

## 2016-11-03 DIAGNOSIS — G8929 Other chronic pain: Secondary | ICD-10-CM | POA: Insufficient documentation

## 2016-11-03 DIAGNOSIS — S86891A Other injury of other muscle(s) and tendon(s) at lower leg level, right leg, initial encounter: Secondary | ICD-10-CM | POA: Diagnosis not present

## 2016-11-03 NOTE — Patient Instructions (Signed)
Shin Splints Rehab Ask your health care provider which exercises are safe for you. Do exercises exactly as told by your health care provider and adjust them as directed. It is normal to feel mild stretching, pulling, tightness, or discomfort as you do these exercises, but you should stop right away if you feel sudden pain or your pain gets worse.Do not begin these exercises until told by your health care provider. Stretching and range of motion exercise This exercise warms up your muscles and joints and improves the movement and flexibility of your lower leg. This exercise also helps to relieve pain, numbness, and tingling. Exercise A: Calf stretch, standing   1. Stand with the ball of your left / right foot on a step. The ball of your foot is on the walking surface, right under your toes. 2. Keep your other foot firmly on the same step. 3. Hold onto the wall, a railing, or a chair for balance. 4. Slowly lift your other foot, allowing your body weight to press your left / right heel down over the edge of the step. You should feel a stretch in your left / right calf. 5. Hold this position for __________ seconds. 6. Repeat this exercise with a slight bend in your left / right knee. Repeat __________ times with your left / right knee straight and __________ times with your left / right knee bent. Complete this exercise __________ times a day. Strengthening exercises These exercises build strength and endurance in your lower leg. Endurance is the ability to use your muscles for a long time, even after they get tired. Exercise B: Dorsiflexion   1. Secure a rubber exercise band or tubing to a fixed object, such as a table leg or a pole. 2. Secure the other end of the band around your left / right foot. 3. Sit on the floor, facing the fixed object. The band should be slightly tense when your foot is relaxed. 4. Slowly use your ankle muscles to pull your foot toward you. 5. Hold this position for  __________ seconds. 6. Slowly release the tension in the band and return your foot to the starting position. Repeat __________ times. Complete this exercise __________ times a day. Exercise C: Ankle eversion with band  1. Secure one end of a rubber exercise band or tubing to a fixed object, such as a table leg or a pole, that will stay in place when the band is pulled. 2. Loop the other end of the band around the middle of your left / right foot. 3. Sit on the floor, facing the fixed object. The band should be slightly tense when your foot is relaxed. 4. Make fists with your hands and put them between your knees. This will focus your strengthening at your ankle. 5. Leading with your little toe, slowly push your banded foot outward, away from your other leg. Make sure the band is positioned to resist the entire motion. 6. Hold this position for __________ seconds. 7. Control the tension in the band as you slowly return your foot to the starting position. Repeat __________ times. Complete this exercise __________ times a day. Exercise D: Ankle inversion with band  1. Secure one end of a rubber exercise band or tubing to a fixed object, such as a table leg or a pole, that will stay still when the band is pulled. 2. Loop the other end of the band around your left / right foot, just below your toes. 3. Sit on the  floor, facing the fixed object. The band should be slightly tense when your foot is relaxed. 4. Make fists with your hands and put them between your knees. This will focus your strengthening at your ankle. 5. Leading with your big toe, slowly pull your banded foot inward, toward your other leg. Make sure the band is positioned to resist the entire motion. 6. Hold this position for __________ seconds. 7. Control the tension in the band as you slowly return your foot to the starting position. Repeat __________ times. Complete this exercise __________ times a day. Exercise E: Lateral walking  with band  1. Stand in a long hallway. 2. Wrap a loop of exercise band around your legs, just above your knees. 3. Bend your knees gently and drop your hips down and back so your weight is over your heels. 4. Step to the side to move down the length of the hallway, keeping your toes pointed forward and keeping tension in the band. 5. Repeat, leading with your other leg. Repeat __________ times. Complete this exercise __________ times a day. Balance exercise This exercise will help improve your control of your foot and ankle when you are standing or walking. Exercise F: Single leg stand  1. Without wearing shoes, stand near a railing or in a doorway. You may hold onto the railing or door frame as needed. 2. Stand on your left / right foot. Keep your big toe down on the floor and try to keep your arch lifted. 3. If this exercise is too easy, you can try doing it with your eyes closed or while standing on a pillow. 4. Hold this position for __________ seconds. Repeat __________ times. Complete this exercise __________ times a day. This information is not intended to replace advice given to you by your health care provider. Make sure you discuss any questions you have with your health care provider. Document Released: 08/15/2005 Document Revised: 04/19/2016 Document Reviewed: 05/14/2015 Elsevier Interactive Patient Education  2017 [employer].

## 2016-11-03 NOTE — Progress Notes (Signed)
Subjective:    Patient ID: Glenn Robertson, male    DOB: 1966-04-02, 51 y.o.   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  Has bilateral SMOs to help with bilateral foot/ankle pronation  Pain Inventory Average Pain 0 Pain Right Now 0 My pain is burning  In the last 24 hours, has pain interfered with the following? General activity 9 Relation with others 0 Enjoyment of life 9 What TIME of day is your pain at its worst? . Sleep (in general) Good  Pain is worse with: . Pain improves with: rest Relief from Meds: .  Mobility walk without assistance  Function Do you have any goals in this area?  no  Neuro/Psych No problems in this area  Prior Studies Any changes since last visit?  no  Physicians involved in your care Any changes since last visit?  no   Family History  Problem Relation Age of Onset  . Diabetes Father   . Diabetes Paternal Uncle    Social History   Social History  . Marital status: Single    Spouse name: N/A  . Number of children: N/A  . Years of education: N/A   Social History Main Topics  . Smoking status: Never Smoker  . Smokeless tobacco: Never Used  . Alcohol use No  . Drug use: No  . Sexual activity: Not on file   Other Topics Concern  . Not on file   Social History Narrative  . No narrative on file   Past Surgical History:  Procedure Laterality Date  . Arm Surgery  1/12   rt ulnar nerve decompression  . EPIDURAL BLOCK INJECTION     several  . ULNAR NERVE TRANSPOSITION  01/19/2012   Procedure: ULNAR NERVE DECOMPRESSION/TRANSPOSITION;  Surgeon: Cammie Sickle., MD;  Location: [place];  Service: Orthopedics;  Laterality: Left;  ulnar nerve decompression vs transposition left elbow   Past Medical History:  Diagnosis Date  . Allergy   . Asthma   . Chronic pain   . Depression    . Diabetes mellitus   . Neuromuscular disorder ([place])    There were no vitals taken for this visit.  Opioid Risk Score:   Fall Risk Score:  `1  Depression screen PHQ 2/9  Depression screen [facility] 2/9 10/28/2016 07/26/2016 04/18/2016 03/02/2016 11/24/2015 11/24/2015 04/21/2015  Decreased Interest 0 0 0 0 0 0 2  Down, Depressed, Hopeless 0 0 0 0 0 0 3  PHQ - 2 Score 0 0 0 0 0 0 5  Altered sleeping - - - - - - -  Tired, decreased energy - - - - - - -  Change in appetite - - - - - - -  Feeling bad or failure about yourself  - - - - - - -  Trouble concentrating - - - - - - -  Moving slowly or fidgety/restless - - - - - - -  Suicidal thoughts - - - - - - -  PHQ-9 Score - - - - - - -    Review of Systems  Constitutional: Negative.   HENT: Negative.   Eyes: Negative.   Respiratory: Negative.   Cardiovascular: Negative.   Gastrointestinal: Negative.   Endocrine: Negative.   Genitourinary: Negative.   Musculoskeletal: Negative.   Skin: Negative.   Allergic/Immunologic: Negative.   Neurological: Negative.  Hematological: Negative.   Psychiatric/Behavioral: Negative.   All other systems reviewed and are negative.      Objective:   Physical Exam  Constitutional: He is oriented to person, place, and time. He appears well-developed and well-nourished.  HENT:  Head: Normocephalic and atraumatic.  Eyes: Conjunctivae are normal. Pupils are equal, round, and reactive to light.  Musculoskeletal:       Right knee: Normal.       Left knee: Normal.       Right ankle: Normal. He exhibits normal range of motion and no deformity. No tenderness.       Left ankle: He exhibits normal range of motion and no ecchymosis. No tenderness.       Right lower leg: He exhibits no tenderness, no swelling and no edema.       Left lower leg: He exhibits no tenderness, no swelling and no edema.       Right foot: There is deformity. There is normal range of motion.       Left foot: There is deformity. There is  normal range of motion.  Mild ant tib pain with passive ankle plantar flexion and inversion  Neurological: He is alert and oriented to person, place, and time.  Psychiatric: He has a normal mood and affect.  Nursing note and vitals reviewed.         Assessment & Plan:  1.  Anterior lateralpainPretibial, no evidence of swelling or DVT. This occurs mainly after walking at least 1 half mile. Most likely shinsplints. Does not have any numbness or tingling with this to indicate a compartment syndrome that could affect peripheral nerves. His training, he has tendency to increase his mileage too rapidly. We discussed that he should go back to walking 1 mile per day and only go up by 10% mileage per week. 2. His goal of 5 K before he starts jogging or running. Went over his shin splint exercises. Charlett Blake M.D. [facility] FAAPM&R (Sports Med, Neuromuscular Med) Diplomate Am Board of Electrodiagnostic Med

## 2016-11-16 DIAGNOSIS — E119 Type 2 diabetes mellitus without complications: Secondary | ICD-10-CM | POA: Diagnosis not present

## 2016-11-17 DIAGNOSIS — M5442 Lumbago with sciatica, left side: Secondary | ICD-10-CM | POA: Diagnosis not present

## 2016-11-17 DIAGNOSIS — G3184 Mild cognitive impairment, so stated: Secondary | ICD-10-CM | POA: Diagnosis not present

## 2016-11-17 DIAGNOSIS — G603 Idiopathic progressive neuropathy: Secondary | ICD-10-CM | POA: Diagnosis not present

## 2016-11-17 DIAGNOSIS — M5441 Lumbago with sciatica, right side: Secondary | ICD-10-CM | POA: Diagnosis not present

## 2016-11-17 DIAGNOSIS — G5601 Carpal tunnel syndrome, right upper limb: Secondary | ICD-10-CM | POA: Diagnosis not present

## 2016-12-12 ENCOUNTER — Ambulatory Visit (INDEPENDENT_AMBULATORY_CARE_PROVIDER_SITE_OTHER): Payer: Medicare Other | Admitting: Sports Medicine

## 2016-12-12 VITALS — BP 120/77 | Ht 70.0 in | Wt 200.0 lb

## 2016-12-12 DIAGNOSIS — M25512 Pain in left shoulder: Secondary | ICD-10-CM

## 2016-12-12 DIAGNOSIS — M533 Sacrococcygeal disorders, not elsewhere classified: Secondary | ICD-10-CM

## 2016-12-12 NOTE — Progress Notes (Signed)
   Subjective:    Patient ID: Glenn Robertson, male    DOB: 03-Jan-1966, 51 y.o.   MRN: [id number]  HPI chief complaint: Left shoulder and right hip pain  Patient comes in today complaining of 2 months of lateral left shoulder pain. Pain is present primarily with doing a lateral deltoid raise in the gym. He has minimal symptoms otherwise. He's had similar problems in the past. Physical therapy was recommended but he did not do it. He denies numbness and tingling. No trauma. He is also complaining of right-sided low back/hip pain that he localizes to the SI joint. Worse with running. He has a history of radiculopathy but his current pain is different in nature than what he has experienced in the past. No radiating pain down the leg. No numbness or tingling. He has custom orthotics in his workouts shoes already.    Review of Systems As above    Objective:   Physical Exam  Well-developed, well-nourished. No acute distress  Left shoulder: Full range of motion. No tenderness to palpation. Mildly positive empty can, mildly positive Hawkins. Rotator cuff strength is 5/5 bilaterally but reproducible of pain with resisted supraspinatus. No pain with resisted internal rotation or external rotation. Negative speed's, negative Yeargonsons. Neurovascular intact distally   right hip: Smooth painless hip range of motion with a negative logroll. He is tender to palpation directly over the right SI joint with a positive FABER. No focal neurological deficit of the right lower extremity.      Assessment & Plan:Left shoulder pain secondary to rotator cuff tendinitis/subacromial bursitis Right hip pain secondary to SI joint dysfunction   For both his left shoulder and his right hip I will refer him for formal physical therapy. He will avoid exercises in the gym the cause him discomfort. I discussed the possibility of a subacromial cortisone injection for the left shoulder if his symptoms persist. Follow-up for  ongoing or recalcitrant issues.

## 2016-12-14 ENCOUNTER — Encounter: Payer: Self-pay | Admitting: Physical Therapy

## 2016-12-14 ENCOUNTER — Ambulatory Visit: Payer: Medicare Other | Attending: Sports Medicine | Admitting: Physical Therapy

## 2016-12-14 DIAGNOSIS — G8929 Other chronic pain: Secondary | ICD-10-CM

## 2016-12-14 DIAGNOSIS — M545 Low back pain, unspecified: Secondary | ICD-10-CM

## 2016-12-14 DIAGNOSIS — M25512 Pain in left shoulder: Secondary | ICD-10-CM | POA: Insufficient documentation

## 2016-12-14 DIAGNOSIS — M6281 Muscle weakness (generalized): Secondary | ICD-10-CM

## 2016-12-14 NOTE — Therapy (Signed)
[place], [name], [address] Phone: [phone]   Fax:  [phone]  Physical Therapy Evaluation  Patient Details  Name: Chevez Sambrano MRN: [id number] Date of Birth: 1966/02/07 Referring Provider: Dr Lilia Argue   Encounter Date: 12/14/2016      PT End of Session - 12/14/16 1503    Visit Number 1   Number of Visits 16   Date for PT Re-Evaluation 02/08/17   Authorization Type Medicare    PT Start Time 0930   PT Stop Time 1013   PT Time Calculation (min) 43 min   Activity Tolerance Patient tolerated treatment well   Behavior During Therapy [facility] for tasks assessed/performed      Past Medical History:  Diagnosis Date  . Allergy   . Asthma   . Chronic pain   . Depression   . Diabetes mellitus   . Neuromuscular disorder [facility])     Past Surgical History:  Procedure Laterality Date  . Arm Surgery  1/12   rt ulnar nerve decompression  . EPIDURAL BLOCK INJECTION     several  . ULNAR NERVE TRANSPOSITION  01/19/2012   Procedure: ULNAR NERVE DECOMPRESSION/TRANSPOSITION;  Surgeon: Cammie Sickle., MD;  Location: [place];  Service: Orthopedics;  Laterality: Left;  ulnar nerve decompression vs transposition left elbow    There were no vitals filed for this visit.       Subjective Assessment - 12/14/16 1450    Subjective Patient reports that about a month ago he had an onet of right sided lower back pain. He hd sciatica last year but that had improved. The pain is worst when he is running. He also has left  anterior and lateral shoulder pain. The pain is worst when he uses certain machines in the gym. The machines he is using appears to be machines for his pecs.    Limitations Standing;Walking   How long can you sit comfortably? No limit   How long can you stand comfortably? He has pain at times but it is inconsitent    How long can you walk comfortably? No limit walking, pain with  running    Currently in Pain? Yes   Pain Score 6    Pain Location Back   Pain Orientation Right   Pain Descriptors / Indicators Aching   Pain Type Chronic pain   Pain Radiating Towards right buttock    Pain Onset More than a month ago   Pain Frequency Intermittent   Aggravating Factors  running    Pain Relieving Factors rest    Effect of Pain on Daily Activities difficulty perfroming ADL's    Multiple Pain Sites No            [facility] PT Assessment - 12/14/16 0001      Assessment   Medical Diagnosis Left shoulder pain/ Right SI pain    Referring Provider Dr Lilia Argue    Onset Date/Surgical Date --  shoulder chronic; SI over the last month    Next MD Visit Nothing schedueled    Prior Therapy Yes for Sciatica and right knee pain.      Precautions   Precautions None     Restrictions   Weight Bearing Restrictions No     Balance Screen   Has the patient fallen in the past 6 months Yes   How many times? --  fell of bike   Has the patient had a decrease in activity level  because of a fear of falling?  No   Is the patient reluctant to leave their home because of a fear of falling?  No     Home [employer] residence   Additional Comments No steps at his house      Prior Function   Level of Independence Independent   Vocation Unemployed   Leisure Goes to the gym, bikes, runs      Cognition   Overall Cognitive Status Within Functional Limits for tasks assessed   Attention Focused   Focused Attention Appears intact   Memory Appears intact   Awareness Appears intact   Problem Solving Appears intact     Observation/Other Assessments   Observations Patient has orthotics and 2 hinged ankle braces he wears; Bilateral pronation of his feet L > R.    Focus on Therapeutic Outcomes (FOTO)  40% limitation      Sensation   Additional Comments Denies numbness and parathesis into the leg      Coordination   Gross Motor Movements are Fluid and  Coordinated No   Fine Motor Movements are Fluid and Coordinated No     ROM / Strength   AROM / PROM / Strength PROM;AROM;Strength     AROM   Overall AROM Comments No pain with active motion of the hip ; Pain with end range active left shoulder flexion and abduction      PROM   Overall PROM Comments Pain with end range passive flexion and abduction      Strength   Strength Assessment Site Shoulder;Hip   Right/Left Shoulder Left   Left Shoulder Flexion 4+/5   Left Shoulder ABduction 4+/5   Left Shoulder Internal Rotation 5/5   Left Shoulder External Rotation 4+/5   Right/Left Hip Right;Left   Right Hip Flexion 4/5   Right Hip Extension 4/5   Right Hip ABduction 4+/5   Left Hip Flexion 4/5   Left Hip Extension 4+/5   Left Hip ABduction 5/5     Flexibility   Soft Tissue Assessment /Muscle Length yes   Hamstrings pain with active hamstring stretch; 90/90 stretch 30 degrees from straight on right 25 degrees on the left      Palpation   SI assessment  Decreased sacral movement bilateral    Palpation comment spasming arond right side L5 into the sacral area     Ambulation/Gait   Gait Comments bilateral toe out,                    [facility] Adult PT Treatment/Exercise - 12/14/16 0001      Exercises   Exercises --     Shoulder Exercises: Seated   Other Seated Exercises shoulder row 25# 2x10; lat pull down 2x10 25#     Shoulder Exercises: Standing   Other Standing Exercises pec stretch 2x20sec hold;      Manual Therapy   Manual therapy comments PA mobilization to the scarum; Gentle right leg distraction; soft tissue mobilization to right lumbar paraspinals.                 PT Education - 12/14/16 1501    Education provided Yes   Education Details significant education perfromed on the improtance of symptom mangement and not doing activity that exacerbate his problems   Person(s) Educated Patient   Methods Explanation;Demonstration;Verbal cues    Comprehension Verbalized understanding;Returned demonstration;Verbal cues required;Tactile cues required;Need further instruction  PT Short Term Goals - 12/14/16 1508      PT SHORT TERM GOAL #1   Title Patient will demsotrate full pain free left shoulder ROM    Time 4   Period Weeks   Status New     PT SHORT TERM GOAL #2   Title Patient will demsotrate 5/5 left shoulder and scapular strength    Time 4   Period Weeks   Status New     PT SHORT TERM GOAL #3   Title Patient will demsotrate 5/5 bilateral hip flexion    Time 4   Period Weeks   Status New     PT SHORT TERM GOAL #4   Title Patient will be independent with inital HEP for strengthening    Time 4   Period Weeks   Status New           PT Long Term Goals - 12/14/16 1510      PT LONG TERM GOAL #1   Title Patient will demsotrate a 31% deficit on FOTO for his left shoulder    Time 8   Period Weeks   Status On-going     PT LONG TERM GOAL #2   Title Patient will perform shoulder gym activity without increased pain    Time 8   Period Weeks   Status New     PT LONG TERM GOAL #3   Title Patient will run for 20 minutes without self reported increased SI and back pain    Time 8   Period Weeks   Status On-going               Plan - 12/14/16 1456    Clinical Impression Statement Patient is a 51 year old male with left shoulder pain and right sided back and SI pain. He presents with right hip flexor weakness, decreased SI mobility, and tight hamstrings. His signs and symptoms in his shoulder are consistent with impingement. He has a positive Hawkins test,, He had minor pain with empty can test. All other tests were negative for his shoulder. He was given gym activity to work on and activity's to avoid. He would benefit from futher therapy to adress the above deficits. He was seen for a low complexity evalaution.    Rehab Potential Good   PT Frequency 2x / week   PT Duration 8 weeks   PT  Treatment/Interventions ADLs/Self Care Home Management;Cryotherapy;Electrical Stimulation;Iontophoresis 4mg /ml Dexamethasone;Gait training;Moist Heat;Ultrasound;Patient/family education;Therapeutic exercise;Therapeutic activities;Manual techniques;Passive range of motion;Taping;Neuromuscular re-education   PT Next Visit Plan continue to work on scapualr stability for his shoulder, add in band exercises for the rotator cuff, consdier wall flexion with abduction. Continue with right hip strengthening; consider SI, pelvic manipulation; add in pirformis stretch,     PT Home Exercise Plan hamstring stretch, bride with abduction, spine extension; pec stretch, lat pull down; row machine    Consulted and Agree with Plan of Care Patient      Patient will benefit from skilled therapeutic intervention in order to improve the following deficits and impairments:  Decreased activity tolerance, Decreased strength, Difficulty walking, Increased muscle spasms, Postural dysfunction, Impaired UE functional use  Visit Diagnosis: Chronic left shoulder pain - Plan: PT plan of care cert/re-cert  Acute right-sided low back pain without sciatica - Plan: PT plan of care cert/re-cert  Muscle weakness - Plan: PT plan of care cert/re-cert      G-Codes - 04/12/47 1518    Functional Assessment Tool Used (Outpatient Only) FOTO  clinical decision making    Functional Limitation Mobility: Walking and moving around   Mobility: Walking and Moving Around Current Status [phone]) At least 20 percent but less than 40 percent impaired, limited or restricted   Mobility: Walking and Moving Around Goal Status [phone]) At least 1 percent but less than 20 percent impaired, limited or restricted       Problem List Patient Active Problem List   Diagnosis Date Noted  . Pain in shin 10/28/2016  . Left wrist pain 07/12/2016  . Lumbosacral radiculopathy at S1 03/15/2016  . Lumbar degenerative disc disease 12/18/2015  . Subacromial  impingement of left shoulder 10/26/2015  . Left lumbar radiculitis 04/21/2015  . HNP (herniated nucleus pulposus), lumbar 04/21/2015  . Chondromalacia of both patellae 02/24/2015  . Paranoid schizophrenia, chronic condition ([place]) 02/24/2015  . Chronic low back pain 07/18/2014  . Knee pain, acute 07/18/2014  . Right ankle pain 01/27/2014  . Pain in joint, ankle and foot 01/27/2014  . Sciatica 01/10/2012  . Thoracic or lumbosacral neuritis or radiculitis, unspecified 12/15/2011  . Ulnar neuropathy at elbow 11/14/2011  . Disturbance of skin sensation 11/14/2011    Carney Living PT DPT  12/14/2016, 3:51 PM  [facility] [address] [place], [name], [address] Phone: [phone]   Fax:  [phone]  Name: Anil Havard MRN: [id number] Date of Birth: [birth year]

## 2016-12-14 NOTE — Therapy (Deleted)
[place], [name], [address] Phone: [phone]   Fax:  [phone]  Physical Therapy Treatment  Patient Details  Name: Glenn Robertson MRN: [id number] Date of Birth: 05-Oct-1965 Referring Provider: Dr Lilia Argue   Encounter Date: 12/14/2016      PT End of Session - 12/14/16 1503    Visit Number 1   Number of Visits 16   Date for PT Re-Evaluation 02/08/17   Authorization Type Medicare    PT Start Time 0930   PT Stop Time 1013   PT Time Calculation (min) 43 min   Activity Tolerance Patient tolerated treatment well   Behavior During Therapy [facility] for tasks assessed/performed      Past Medical History:  Diagnosis Date  . Allergy   . Asthma   . Chronic pain   . Depression   . Diabetes mellitus   . Neuromuscular disorder [facility])     Past Surgical History:  Procedure Laterality Date  . Arm Surgery  1/12   rt ulnar nerve decompression  . EPIDURAL BLOCK INJECTION     several  . ULNAR NERVE TRANSPOSITION  01/19/2012   Procedure: ULNAR NERVE DECOMPRESSION/TRANSPOSITION;  Surgeon: Cammie Sickle., MD;  Location: [place];  Service: Orthopedics;  Laterality: Left;  ulnar nerve decompression vs transposition left elbow    There were no vitals filed for this visit.      Subjective Assessment - 12/14/16 1450    Subjective Patient reports that about a month ago he had an onet of right sided lower back pain. He hd sciatica last year but that had improved. The pain is worst when he is running. He also has left  anterior and lateral shoulder pain. The pain is worst when he uses certain machines in the gym. The machines he is using appears to be machines for his pecs.    Limitations Standing;Walking   How long can you sit comfortably? No limit   How long can you stand comfortably? He has pain at times but it is inconsitent    How long can you walk comfortably? No limit walking, pain with  running    Currently in Pain? Yes   Pain Score 6    Pain Location Back   Pain Orientation Right   Pain Descriptors / Indicators Aching   Pain Type Chronic pain   Pain Radiating Towards right buttock    Pain Onset More than a month ago   Pain Frequency Intermittent   Aggravating Factors  running    Pain Relieving Factors rest    Effect of Pain on Daily Activities difficulty perfroming ADL's    Multiple Pain Sites No            [facility] PT Assessment - 12/14/16 0001      Assessment   Referring Provider Dr Lilia Argue    Next MD Visit Nothing schedueled    Prior Therapy Yes for Sciatica     Precautions   Precautions None     Restrictions   Weight Bearing Restrictions No     Balance Screen   Has the patient fallen in the past 6 months Yes   How many times? --  fell of bike   Has the patient had a decrease in activity level because of a fear of falling?  No   Is the patient reluctant to leave their home because of a fear of falling?  No     Home Environment  Living Environment Private residence   Additional Comments No steps at his house      Prior Function   Level of Independence Independent   Vocation Unemployed   Leisure Goes to [employer], bikes, runs      Cognition   Overall Cognitive Status Within Functional Limits for tasks assessed   Attention Focused   Focused Attention Appears intact   Memory Appears intact   Awareness Appears intact   Problem Solving Appears intact     Sensation   Additional Comments Denies numbness and parathesis into the leg      Coordination   Gross Motor Movements are Fluid and Coordinated No   Fine Motor Movements are Fluid and Coordinated No     ROM / Strength   AROM / PROM / Strength PROM;AROM;Strength                             PT Education - 12/14/16 1501    Education provided Yes   Education Details significant education perfromed on the improtance of symptom mangement and not doing activity  that exacerbate his problems   Person(s) Educated Patient   Methods Explanation;Demonstration;Verbal cues   Comprehension Verbalized understanding;Returned demonstration;Verbal cues required;Tactile cues required;Need further instruction          PT Short Term Goals - 12/14/16 1508      PT SHORT TERM GOAL #1   Title Patient will demsotrate full pain free left shoulder ROM    Time 4   Period Weeks   Status New     PT SHORT TERM GOAL #2   Title Patient will demsotrate 5/5 left shoulder and scapular strength    Time 4   Period Weeks   Status New     PT SHORT TERM GOAL #3   Title Patient will demsotrate 5/5 bilateral hip flexion    Time 4   Period Weeks   Status New     PT SHORT TERM GOAL #4   Title Patient will be independent with inital HEP for strengthening    Time 4   Period Weeks   Status New           PT Long Term Goals - 12/14/16 1510      PT LONG TERM GOAL #1   Title Patient will demsotrate a 31% deficit on FOTO for his left shoulder    Time 8   Period Weeks   Status On-going     PT LONG TERM GOAL #2   Title Patient will perform shoulder gym activity without increased pain    Time 8   Period Weeks   Status New     PT LONG TERM GOAL #3   Title Patient will run for 20 minutes without self reported increased SI and back pain    Time 8   Period Weeks   Status On-going               Plan - 12/14/16 1456    Clinical Impression Statement Patient is a 51 year old male with left shoulder pain and right sided back and SI pain. He presents with right hip flexor weakness, decreased SI mobility, and tight hamstrings. His signs and symptoms in his shoulder are consistent with impingement. He has a positive Hawkins test,, He had minor pain with empty can test. All other tests were negative for his shoulder. He was given gym activity to work on and activity's to  avoid. He would benefit from futher therapy to adress the above deficits. He was seen for a low  complexity evalaution.    Rehab Potential Good   PT Frequency 2x / week   PT Duration 8 weeks   PT Treatment/Interventions ADLs/Self Care Home Management;Cryotherapy;Electrical Stimulation;Iontophoresis 4mg /ml Dexamethasone;Gait training;Moist Heat;Ultrasound;Patient/family education;Therapeutic exercise;Therapeutic activities;Manual techniques;Passive range of motion;Taping;Neuromuscular re-education   Consulted and Agree with Plan of Care Patient      Patient will benefit from skilled therapeutic intervention in order to improve the following deficits and impairments:  Decreased activity tolerance  Visit Diagnosis: Chronic left shoulder pain  Acute right-sided low back pain without sciatica  Muscle weakness       G-Codes - 12/25/16 1518    Functional Assessment Tool Used (Outpatient Only) FOTO clinical decision making    Functional Limitation Mobility: Walking and moving around   Mobility: Walking and Moving Around Current Status ([id number]) At least 20 percent but less than 40 percent impaired, limited or restricted   Mobility: Walking and Moving Around Goal Status [phone]) At least 1 percent but less than 20 percent impaired, limited or restricted      Problem List Patient Active Problem List   Diagnosis Date Noted  . Pain in shin 10/28/2016  . Left wrist pain 07/12/2016  . Lumbosacral radiculopathy at S1 03/15/2016  . Lumbar degenerative disc disease 12/18/2015  . Subacromial impingement of left shoulder 10/26/2015  . Left lumbar radiculitis 04/21/2015  . HNP (herniated nucleus pulposus), lumbar 04/21/2015  . Chondromalacia of both patellae 02/24/2015  . Paranoid schizophrenia, chronic condition ([place]) 02/24/2015  . Chronic low back pain 07/18/2014  . Knee pain, acute 07/18/2014  . Right ankle pain 01/27/2014  . Pain in joint, ankle and foot 01/27/2014  . Sciatica 01/10/2012  . Thoracic or lumbosacral neuritis or radiculitis, unspecified 12-26-11  . Ulnar neuropathy at  elbow 11/14/2011  . Disturbance of skin sensation 11/14/2011    Carney Living PT DPT  12-25-2016, 3:22 PM  [facility] [address] [place], [name], [address] Phone: [phone]   Fax:  [phone]  Name: Glenn Robertson MRN: [id number] Date of Birth: [birth year]

## 2016-12-14 NOTE — Addendum Note (Signed)
Addended by: Carney Living on: 12/14/2016 03:52 PM   Modules accepted: Orders

## 2016-12-16 ENCOUNTER — Ambulatory Visit: Payer: Medicare Other | Admitting: Physical Therapy

## 2016-12-16 ENCOUNTER — Encounter: Payer: Self-pay | Admitting: Physical Therapy

## 2016-12-16 DIAGNOSIS — M25512 Pain in left shoulder: Principal | ICD-10-CM

## 2016-12-16 DIAGNOSIS — G8929 Other chronic pain: Secondary | ICD-10-CM | POA: Diagnosis not present

## 2016-12-16 DIAGNOSIS — M6281 Muscle weakness (generalized): Secondary | ICD-10-CM

## 2016-12-16 DIAGNOSIS — M545 Low back pain, unspecified: Secondary | ICD-10-CM

## 2016-12-16 NOTE — Therapy (Signed)
[place], [name], [address] Phone: [phone]   Fax:  [phone]  Physical Therapy Treatment  Patient Details  Name: Glenn Robertson MRN: [id number] Date of Birth: 1966-05-26 Referring Provider: Dr Lilia Argue   Encounter Date: 12/16/2016      PT End of Session - 12/16/16 0934    Visit Number 2   Number of Visits 16   Date for PT Re-Evaluation 02/08/17   Authorization Type Medicare    PT Start Time 0931   PT Stop Time 1023   PT Time Calculation (min) 52 min   Activity Tolerance Patient tolerated treatment well   Behavior During Therapy [facility] for tasks assessed/performed      Past Medical History:  Diagnosis Date  . Allergy   . Asthma   . Chronic pain   . Depression   . Diabetes mellitus   . Neuromuscular disorder [facility])     Past Surgical History:  Procedure Laterality Date  . Arm Surgery  1/12   rt ulnar nerve decompression  . EPIDURAL BLOCK INJECTION     several  . ULNAR NERVE TRANSPOSITION  01/19/2012   Procedure: ULNAR NERVE DECOMPRESSION/TRANSPOSITION;  Surgeon: Cammie Sickle., MD;  Location: [place];  Service: Orthopedics;  Laterality: Left;  ulnar nerve decompression vs transposition left elbow    There were no vitals filed for this visit.      Subjective Assessment - 12/16/16 0933    Subjective Patient reports the pain in his hip as at about a 3/10 today. He had increased pain when he walked yesterday. He has noticed no change in his shoulder.    Limitations Standing;Walking   How long can you sit comfortably? No limit   How long can you stand comfortably? He has pain at times but it is inconsitent    How long can you walk comfortably? No limit walking, pain with running    Currently in Pain? Yes   Pain Score 3    Pain Location Back   Pain Orientation Right   Pain Descriptors / Indicators Aching   Pain Type Chronic pain   Pain Radiating Towards right  buttock    Pain Onset More than a month ago   Pain Frequency Intermittent   Aggravating Factors  running    Pain Relieving Factors rest    Effect of Pain on Daily Activities difficulty perfroming ADL's                          [facility] Adult PT Treatment/Exercise - 12/16/16 0001      Knee/Hip Exercises: Standing   Other Standing Knee Exercises cone drill 2x10; eccentric step down 2x10; lateral band walk 2x10 with green;      Shoulder Exercises: Standing   Other Standing Exercises standing flexion and scaption in mirror x104 lbs. Cuing for technique      Manual Therapy   Manual therapy comments PA mobilization to the scarum; Gentle right leg distraction; soft tissue mobilization to right lumbar paraspinals. Grade 5 Long axis thrust manuever 3x without cavitation; ER/IR MET without caviation 3x; LAD x 3 minutes; No increase in pain noted. PA and AP mobilization of the hshoulder to decrease pain Grade 2 and 3 mobilizations                  PT Education - 12/16/16 1228    Education provided Yes   Education Details education peorfmed  on the importance of stability in standing    Person(s) Educated Patient   Methods Explanation;Demonstration;Verbal cues   Comprehension Returned demonstration;Verbalized understanding          PT Short Term Goals - 12/14/16 1508      PT SHORT TERM GOAL #1   Title Patient will demsotrate full pain free left shoulder ROM    Time 4   Period Weeks   Status New     PT SHORT TERM GOAL #2   Title Patient will demsotrate 5/5 left shoulder and scapular strength    Time 4   Period Weeks   Status New     PT SHORT TERM GOAL #3   Title Patient will demsotrate 5/5 bilateral hip flexion    Time 4   Period Weeks   Status New     PT SHORT TERM GOAL #4   Title Patient will be independent with inital HEP for strengthening    Time 4   Period Weeks   Status New           PT Long Term Goals - 12/14/16 1510      PT LONG TERM  GOAL #1   Title Patient will demsotrate a 31% deficit on FOTO for his left shoulder    Time 8   Period Weeks   Status On-going     PT LONG TERM GOAL #2   Title Patient will perform shoulder gym activity without increased pain    Time 8   Period Weeks   Status New     PT LONG TERM GOAL #3   Title Patient will run for 20 minutes without self reported increased SI and back pain    Time 8   Period Weeks   Status On-going               Plan - 12/16/16 1229    Clinical Impression Statement Patient was given single leg stability exercises to improve his ability to walk distances and run. Therapy aslo reviewed light dumbbell exercises to work on at [employer] instead of machines.    Rehab Potential Good   PT Frequency 2x / week   PT Duration 8 weeks   PT Treatment/Interventions ADLs/Self Care Home Management;Cryotherapy;Electrical Stimulation;Iontophoresis 49m/ml Dexamethasone;Gait training;Moist Heat;Ultrasound;Patient/family education;Therapeutic exercise;Therapeutic activities;Manual techniques;Passive range of motion;Taping;Neuromuscular re-education   PT Next Visit Plan continue to work on scapualr stability for his shoulder, add in band exercises for the rotator cuff, consdier wall flexion with abduction. Continue with right hip strengthening; consider SI, pelvic manipulation; add in pirformis stretch,     PT Home Exercise Plan hamstring stretch, bride with abduction, spine extension; pec stretch, lat pull down; row machine    Consulted and Agree with Plan of Care Patient      Patient will benefit from skilled therapeutic intervention in order to improve the following deficits and impairments:  Decreased activity tolerance, Decreased strength, Difficulty walking, Increased muscle spasms, Postural dysfunction, Impaired UE functional use  Visit Diagnosis: Chronic left shoulder pain  Acute right-sided low back pain without sciatica  Muscle weakness     Problem List Patient  Active Problem List   Diagnosis Date Noted  . Pain in shin 10/28/2016  . Left wrist pain 07/12/2016  . Lumbosacral radiculopathy at S1 03/15/2016  . Lumbar degenerative disc disease 12/18/2015  . Subacromial impingement of left shoulder 10/26/2015  . Left lumbar radiculitis 04/21/2015  . HNP (herniated nucleus pulposus), lumbar 04/21/2015  . Chondromalacia of both patellae 02/24/2015  .  Paranoid schizophrenia, chronic condition ([place]) 02/24/2015  . Chronic low back pain 07/18/2014  . Knee pain, acute 07/18/2014  . Right ankle pain 01/27/2014  . Pain in joint, ankle and foot 01/27/2014  . Sciatica 01/10/2012  . Thoracic or lumbosacral neuritis or radiculitis, unspecified 12/15/2011  . Ulnar neuropathy at elbow 11/14/2011  . Disturbance of skin sensation 11/14/2011    Carney Living PT DPT  12/16/2016, 12:38 PM  [facility] [address] [place], [name], [address] Phone: [phone]   Fax:  [phone]  Name: Glenn Robertson MRN: [id number] Date of Birth: 1966/03/13

## 2016-12-20 ENCOUNTER — Ambulatory Visit: Payer: Medicare Other | Admitting: Physical Therapy

## 2017-01-02 ENCOUNTER — Ambulatory Visit: Payer: Medicare Other | Attending: Sports Medicine | Admitting: Physical Therapy

## 2017-01-02 DIAGNOSIS — G8929 Other chronic pain: Secondary | ICD-10-CM

## 2017-01-02 DIAGNOSIS — M545 Low back pain, unspecified: Secondary | ICD-10-CM

## 2017-01-02 DIAGNOSIS — M6281 Muscle weakness (generalized): Secondary | ICD-10-CM | POA: Insufficient documentation

## 2017-01-02 DIAGNOSIS — M25512 Pain in left shoulder: Secondary | ICD-10-CM | POA: Insufficient documentation

## 2017-01-02 NOTE — Therapy (Signed)
[place], [name], [address] Phone: [phone]   Fax:  [phone]  Physical Therapy Treatment  Patient Details  Name: Glenn Robertson MRN: [id number] Date of Birth: [birth year] Referring Provider: Dr Glenn Robertson   Encounter Date: 01/02/2017      PT End of Session - 01/02/17 0936    Visit Number 3   Number of Visits 16   Date for PT Re-Evaluation 02/08/17   Authorization Type Medicare    PT Start Time 0932   PT Stop Time 1016   PT Time Calculation (min) 44 min   Activity Tolerance Patient tolerated treatment well   Behavior During Therapy [facility] for tasks assessed/performed      Past Medical History:  Diagnosis Date  . Allergy   . Asthma   . Chronic pain   . Depression   . Diabetes mellitus   . Neuromuscular disorder [facility])     Past Surgical History:  Procedure Laterality Date  . Arm Surgery  1/12   rt ulnar nerve decompression  . EPIDURAL BLOCK INJECTION     several  . ULNAR NERVE TRANSPOSITION  01/19/2012   Procedure: ULNAR NERVE DECOMPRESSION/TRANSPOSITION;  Surgeon: Cammie Sickle., MD;  Location: [place];  Service: Orthopedics;  Laterality: Left;  ulnar nerve decompression vs transposition left elbow    There were no vitals filed for this visit.      Subjective Assessment - 01/02/17 0934    Subjective [name] reports his legs are sore from all the walking on his trip. He ran through the parking lot and felt it in his back. He has not had time to do his exercises. He was on a 10 day bus tour of [place]    Limitations Standing;Walking   How long can you stand comfortably? He has pain at times but it is inconsitent    How long can you walk comfortably? No limit walking, pain with running    Currently in Pain? No/denies                         [facility] Adult PT Treatment/Exercise - 01/02/17 0001      Lumbar Exercises: Supine   Other Supine Lumbar  Exercises bridge x10; bridge on ball 2x10; bridge with abduction 21x10; double knee to chest 2x10;      Lumbar Exercises: Prone   Other Prone Lumbar Exercises quadruped birdog x10 minor pain noted in his wrsit    Other Prone Lumbar Exercises plamk 1 min;      Knee/Hip Exercises: Standing   Other Standing Knee Exercises cone drill 2x10; eccentric step down 2x10; lateral band walk 2x10 with green; Standing d2 flexion 2x10      Shoulder Exercises: Seated   Other Seated Exercises shoulder row 25# 2x10; lat pull down 2x10 25#     Shoulder Exercises: Standing   Other Standing Exercises standing flexion and scaption in mirror x10 4 lbs. Cuing for technique                 PT Education - 01/02/17 0936    Education provided Yes   Education Details continue with strengthening exercises.    Person(s) Educated Patient   Methods Explanation;Demonstration;Verbal cues   Comprehension Verbalized understanding;Returned demonstration          PT Short Term Goals - 01/02/17 1053      PT SHORT TERM GOAL #1   Title Patient will demsotrate  full pain free left shoulder ROM    Baseline continues to have pain at end range shoulder motion    Time 4   Period Weeks   Status On-going     PT SHORT TERM GOAL #2   Title Patient will demsotrate 5/5 left shoulder and scapular strength    Baseline continue to work on strengthening    Time 4   Period Weeks   Status On-going     PT SHORT TERM GOAL #3   Title Patient will demsotrate 5/5 bilateral hip flexion    Time 4   Period Weeks   Status On-going     PT SHORT TERM GOAL #4   Title Patient will be independent with inital HEP for strengthening    Time 4   Period Weeks   Status On-going           PT Long Term Goals - 12/14/16 1510      PT LONG TERM GOAL #1   Title Patient will demsotrate a 31% deficit on FOTO for his left shoulder    Time 8   Period Weeks   Status On-going     PT LONG TERM GOAL #2   Title Patient will perform  shoulder gym activity without increased pain    Time 8   Period Weeks   Status New     PT LONG TERM GOAL #3   Title Patient will run for 20 minutes without self reported increased SI and back pain    Time 8   Period Weeks   Status On-going               Plan - 01/02/17 1050    Clinical Impression Statement Therapy reviewed running exercises. The patient reports he plans on walking 4-5 miles today. He was advised this might not be the best idea 2nd to working weak stabilizaer muscles. Therapy did not perfrom SI maniapulation this visit but he may benefit from it next visit.    Rehab Potential Good   PT Frequency 2x / week   PT Duration 8 weeks   PT Treatment/Interventions ADLs/Self Care Home Management;Cryotherapy;Electrical Stimulation;Iontophoresis 4mg /ml Dexamethasone;Gait training;Moist Heat;Ultrasound;Patient/family education;Therapeutic exercise;Therapeutic activities;Manual techniques;Passive range of motion;Taping;Neuromuscular re-education   PT Next Visit Plan continue to work on scapualr stability for his shoulder, add in band exercises for the rotator cuff, consdier wall flexion with abduction. Continue with right hip strengthening; consider SI, pelvic manipulation; add in pirformis stretch, review eccentric leg press.      PT Home Exercise Plan hamstring stretch, bride with abduction, spine extension; pec stretch, lat pull down; row machine; standing flexion and scaption    Consulted and Agree with Plan of Care Patient      Patient will benefit from skilled therapeutic intervention in order to improve the following deficits and impairments:  Decreased activity tolerance, Decreased strength, Difficulty walking, Increased muscle spasms, Postural dysfunction, Impaired UE functional use  Visit Diagnosis: Chronic left shoulder pain  Acute right-sided low back pain without sciatica  Muscle weakness (generalized)     Problem List Patient Active Problem List    Diagnosis Date Noted  . Pain in shin 10/28/2016  . Left wrist pain 07/12/2016  . Lumbosacral radiculopathy at S1 03/15/2016  . Lumbar degenerative disc disease 12/18/2015  . Subacromial impingement of left shoulder 10/26/2015  . Left lumbar radiculitis 04/21/2015  . HNP (herniated nucleus pulposus), lumbar 04/21/2015  . Chondromalacia of both patellae 02/24/2015  . Paranoid schizophrenia, chronic condition ([place]) 02/24/2015  .  Chronic low back pain 07/18/2014  . Knee pain, acute 07/18/2014  . Right ankle pain 01/27/2014  . Pain in joint, ankle and foot 01/27/2014  . Sciatica 01/10/2012  . Thoracic or lumbosacral neuritis or radiculitis, unspecified 12/15/2011  . Ulnar neuropathy at elbow 11/14/2011  . Disturbance of skin sensation 11/14/2011    Carney Living  Pt dpt 01/02/2017, 12:59 PM  [facility] [address] [place], [name], [address] Phone: [phone]   Fax:  [phone]  Name: Glenn Robertson MRN: [id number] Date of Birth: Jan 27, 1966

## 2017-01-04 ENCOUNTER — Ambulatory Visit: Payer: Medicare Other | Admitting: Physical Therapy

## 2017-01-04 ENCOUNTER — Encounter: Payer: Self-pay | Admitting: Physical Therapy

## 2017-01-04 DIAGNOSIS — M25512 Pain in left shoulder: Secondary | ICD-10-CM | POA: Diagnosis not present

## 2017-01-04 DIAGNOSIS — M545 Low back pain, unspecified: Secondary | ICD-10-CM

## 2017-01-04 DIAGNOSIS — G8929 Other chronic pain: Secondary | ICD-10-CM | POA: Diagnosis not present

## 2017-01-04 DIAGNOSIS — M6281 Muscle weakness (generalized): Secondary | ICD-10-CM | POA: Diagnosis not present

## 2017-01-04 NOTE — Therapy (Signed)
[place], [name], [address] Phone: [phone]   Fax:  [phone]  Physical Therapy Treatment  Patient Details  Name: Glenn Robertson MRN: [id number] Date of Birth: 1966/08/09 Referring Provider: Dr Lilia Argue   Encounter Date: 01/04/2017      PT End of Session - 01/04/17 1608    Visit Number 4   Number of Visits 16   Date for PT Re-Evaluation 02/08/17   Authorization Type Medicare    PT Start Time 0930   PT Stop Time 0160   PT Time Calculation (min) 45 min   Activity Tolerance Patient tolerated treatment well   Behavior During Therapy [facility] for tasks assessed/performed      Past Medical History:  Diagnosis Date  . Allergy   . Asthma   . Chronic pain   . Depression   . Diabetes mellitus   . Neuromuscular disorder [facility])     Past Surgical History:  Procedure Laterality Date  . Arm Surgery  1/12   rt ulnar nerve decompression  . EPIDURAL BLOCK INJECTION     several  . ULNAR NERVE TRANSPOSITION  01/19/2012   Procedure: ULNAR NERVE DECOMPRESSION/TRANSPOSITION;  Surgeon: Cammie Sickle., MD;  Location: [place];  Service: Orthopedics;  Laterality: Left;  ulnar nerve decompression vs transposition left elbow    There were no vitals filed for this visit.      Subjective Assessment - 01/04/17 1604    Subjective Patient reports he was not that sore after the last visit. He has been to the gym this moring. He is working on the [employer] activity that therapy reccomended.    Limitations Standing;Walking   How long can you sit comfortably? No limit   How long can you stand comfortably? He has pain at times but it is inconsitent    How long can you walk comfortably? No limit walking, pain with running    Currently in Pain? No/denies                         [facility] Adult PT Treatment/Exercise - 01/04/17 0001      Ambulation/Gait   Gait Comments reviewed running  in slow motion. Pateint has lateral strike on the tright with running and fast walking.      Knee/Hip Exercises: Supine   Other Supine Knee/Hip Exercises step onto air-ex with emphasis on heel strike 2x10; low range lunge with cuing to keep knee straight    Other Supine Knee/Hip Exercises supine ankle green ev/iv 2x10;      Shoulder Exercises: Seated   Other Seated Exercises shoulder press 2x10 6 lb; triceps extension 2x10 3 plates; bicpes curl 2x10 5lb      Shoulder Exercises: Standing   Other Standing Exercises standing flexion and scaption in mirror x10 4 lbs. Cuing for technique                 PT Education - 01/04/17 1607    Education provided Yes   Education Details reviewed runjing technique. Therapy talked to the patient about proper walking and running technique.    Person(s) Educated Patient   Methods Explanation;Demonstration;Verbal cues;Tactile cues   Comprehension Verbalized understanding;Returned demonstration          PT Short Term Goals - 01/04/17 1644      PT SHORT TERM GOAL #1   Title Patient will demsotrate full pain free left shoulder ROM    Baseline  continues to have pain at end range shoulder motion    Time 4   Period Weeks   Status On-going     PT SHORT TERM GOAL #2   Title Patient will demsotrate 5/5 left shoulder and scapular strength    Baseline continue to work on strengthening    Time 4   Period Weeks   Status On-going     PT SHORT TERM GOAL #3   Title Patient will demonstrate 5/5 bilateral hip flexion    Time 4   Period Weeks   Status On-going     PT SHORT TERM GOAL #4   Title Patient will be independent with inital HEP for strengthening    Time 4   Period Weeks   Status On-going           PT Long Term Goals - 12/14/16 1510      PT LONG TERM GOAL #1   Title Patient will demsotrate a 31% deficit on FOTO for his left shoulder    Time 8   Period Weeks   Status On-going     PT LONG TERM GOAL #2   Title Patient will  perform shoulder gym activity without increased pain    Time 8   Period Weeks   Status New     PT LONG TERM GOAL #3   Title Patient will run for 20 minutes without self reported increased SI and back pain    Time 8   Period Weeks   Status On-going               Plan - 01/04/17 1625    Clinical Impression Statement Patient tolerated treatment well today. Therapy did a slow motion running assessment on the patient. He tends to strike with his lateral foot with gait. Therapy looked at fast walking and running. He had a lateral strike with both. He continues to have decreased single leg stance on the left. He was given focused ankle strengthening on the right . Therapy also reviewed more shoulder exercises.  He was advised to keep his weights low nand focus n form.     Rehab Potential Good   PT Frequency 2x / week   PT Duration 8 weeks   PT Treatment/Interventions ADLs/Self Care Home Management;Cryotherapy;Electrical Stimulation;Iontophoresis 4mg /ml Dexamethasone;Gait training;Moist Heat;Ultrasound;Patient/family education;Therapeutic exercise;Therapeutic activities;Manual techniques;Passive range of motion;Taping;Neuromuscular re-education   PT Next Visit Plan continue to work on scapualr stability for his shoulder, add in band exercises for the rotator cuff, consdier wall flexion with abduction. Continue with right hip strengthening; consider SI, pelvic manipulation; add in pirformis stretch, review eccentric leg press.  Continue    PT Home Exercise Plan hamstring stretch, bride with abduction, spine extension; pec stretch, lat pull down; row machine; standing flexion and scaption    Consulted and Agree with Plan of Care Patient      Patient will benefit from skilled therapeutic intervention in order to improve the following deficits and impairments:  Decreased activity tolerance, Decreased strength, Difficulty walking, Increased muscle spasms, Postural dysfunction, Impaired UE  functional use  Visit Diagnosis: Chronic left shoulder pain  Acute right-sided low back pain without sciatica  Muscle weakness (generalized)     Problem List Patient Active Problem List   Diagnosis Date Noted  . Pain in shin 10/28/2016  . Left wrist pain 07/12/2016  . Lumbosacral radiculopathy at S1 03/15/2016  . Lumbar degenerative disc disease 12/18/2015  . Subacromial impingement of left shoulder 10/26/2015  . Left lumbar radiculitis 04/21/2015  .  HNP (herniated nucleus pulposus), lumbar 04/21/2015  . Chondromalacia of both patellae 02/24/2015  . Paranoid schizophrenia, chronic condition ([place]) 02/24/2015  . Chronic low back pain 07/18/2014  . Knee pain, acute 07/18/2014  . Right ankle pain 01/27/2014  . Pain in joint, ankle and foot 01/27/2014  . Sciatica 01/10/2012  . Thoracic or lumbosacral neuritis or radiculitis, unspecified 12/15/2011  . Ulnar neuropathy at elbow 11/14/2011  . Disturbance of skin sensation 11/14/2011    Carney Living PT DPT  01/04/2017, 4:51 PM  [facility] [address] [place], [name], [address] Phone: [phone]   Fax:  [phone]  Name: Gal Feldhaus MRN: [id number] Date of Birth: [birth year]

## 2017-01-10 DIAGNOSIS — M5442 Lumbago with sciatica, left side: Secondary | ICD-10-CM | POA: Diagnosis not present

## 2017-01-10 DIAGNOSIS — G603 Idiopathic progressive neuropathy: Secondary | ICD-10-CM | POA: Diagnosis not present

## 2017-01-10 DIAGNOSIS — M5441 Lumbago with sciatica, right side: Secondary | ICD-10-CM | POA: Diagnosis not present

## 2017-01-10 DIAGNOSIS — G3184 Mild cognitive impairment, so stated: Secondary | ICD-10-CM | POA: Diagnosis not present

## 2017-01-10 DIAGNOSIS — M2550 Pain in unspecified joint: Secondary | ICD-10-CM | POA: Diagnosis not present

## 2017-01-11 ENCOUNTER — Encounter: Payer: Self-pay | Admitting: Physical Therapy

## 2017-01-11 ENCOUNTER — Ambulatory Visit: Payer: Medicare Other | Admitting: Physical Therapy

## 2017-01-11 DIAGNOSIS — M25512 Pain in left shoulder: Secondary | ICD-10-CM | POA: Diagnosis not present

## 2017-01-11 DIAGNOSIS — G8929 Other chronic pain: Secondary | ICD-10-CM

## 2017-01-11 DIAGNOSIS — M6281 Muscle weakness (generalized): Secondary | ICD-10-CM | POA: Diagnosis not present

## 2017-01-11 DIAGNOSIS — M545 Low back pain, unspecified: Secondary | ICD-10-CM

## 2017-01-11 NOTE — Therapy (Signed)
[place], [name], [address] Phone: [phone]   Fax:  [phone]  Physical Therapy Treatment  Patient Details  Name: Glenn Robertson MRN: [id number] Date of Birth: 1966/03/25 Referring Provider: Dr Lilia Argue   Encounter Date: 01/11/2017      PT End of Session - 01/11/17 1317    Visit Number 5   Number of Visits 16   Date for PT Re-Evaluation 02/08/17   Authorization Type Medicare    PT Start Time 0848   PT Stop Time 0930   PT Time Calculation (min) 42 min   Activity Tolerance Patient tolerated treatment well   Behavior During Therapy [facility] for tasks assessed/performed      Past Medical History:  Diagnosis Date  . Allergy   . Asthma   . Chronic pain   . Depression   . Diabetes mellitus   . Neuromuscular disorder [facility])     Past Surgical History:  Procedure Laterality Date  . Arm Surgery  1/12   rt ulnar nerve decompression  . EPIDURAL BLOCK INJECTION     several  . ULNAR NERVE TRANSPOSITION  01/19/2012   Procedure: ULNAR NERVE DECOMPRESSION/TRANSPOSITION;  Surgeon: Cammie Sickle., MD;  Location: [facility];  Service: Orthopedics;  Laterality: Left;  ulnar nerve decompression vs transposition left elbow    There were no vitals filed for this visit.      Subjective Assessment - 01/11/17 0915    Subjective Patient reports increased pain in his back and increased pain in his left shoulder, He has been doing pullups and some type of weighted shoulder abduction which he said was on a sheet that was given to hi, He was advised not to do those . He has not been able to do his ankle exercises. He is doing at least 2/6 exercises given to him for his return to walking and running program.  He has been to his neurologist    Limitations Standing;Walking   How long can you sit comfortably? No limit   How long can you stand comfortably? He has pain at times but it is inconsitent     Currently in Pain? Yes   Pain Score 4    Pain Location Back   Pain Orientation Right   Pain Descriptors / Indicators Aching                         [facility] Adult PT Treatment/Exercise - 01/11/17 0001      Lumbar Exercises: Supine   Other Supine Lumbar Exercises ankle 3 way green 2x10. Mod cuing for technique      Knee/Hip Exercises: Standing   Other Standing Knee Exercises heel raise 2x10      Shoulder Exercises: Seated   Other Seated Exercises shoulder press 2x10 6 lb; Shoulder row. Initially put 65 lbs on. The patient hasd poor technique and poor ability to perfrom exercise and reported left shoulder pain. He was advised he should do a manageable weight for himself.    Other Seated Exercises Shoulder pull down 2x10 30 lbs;      Manual Therapy   Manual therapy comments PA mobilization to the scarum; Gentle right leg distraction; soft tissue mobilization to right lumbar paraspinals. Grade 5 Long axis thrust manuever 3x without cavitation; ER/IR MET without caviation 4x; LAD x 3 minutes; No increase in pain noted. PA and AP mobilization of the hshoulder to decrease pain Grade 2 and  3 mobilizations                  PT Education - 01/11/17 1316    Education provided Yes   Education Details extensive education again on the improtance of bulding his right ankle, knee , and hip stabllity   Person(s) Educated Patient   Methods Explanation;Demonstration;Tactile cues;Verbal cues   Comprehension Verbalized understanding;Returned demonstration          PT Short Term Goals - 01/04/17 1644      PT SHORT TERM GOAL #1   Title Patient will demsotrate full pain free left shoulder ROM    Baseline continues to have pain at end range shoulder motion    Time 4   Period Weeks   Status On-going     PT SHORT TERM GOAL #2   Title Patient will demsotrate 5/5 left shoulder and scapular strength    Baseline continue to work on strengthening    Time 4   Period Weeks    Status On-going     PT SHORT TERM GOAL #3   Title Patient will demonstrate 5/5 bilateral hip flexion    Time 4   Period Weeks   Status On-going     PT SHORT TERM GOAL #4   Title Patient will be independent with inital HEP for strengthening    Time 4   Period Weeks   Status On-going           PT Long Term Goals - 01/11/17 1322      PT LONG TERM GOAL #1   Title Patient will demsotrate a 31% deficit on FOTO for his left shoulder    Time 8   Status On-going     PT LONG TERM GOAL #2   Title Patient will perform shoulder gym activity without increased pain    Baseline perfroming the wrong activity    Time 8   Period Weeks   Status On-going     PT LONG TERM GOAL #3   Title Patient will run for 20 minutes without self reported increased SI and back pain    Baseline 4/5   Time 8   Period Weeks   Status On-going               Plan - 01/11/17 1317    Clinical Impression Statement Therapy again advised the patient to not do things with his shoulders that cuases pain. He does too much weight and does activity's that will cause damage to his shoulders. Therapy reviewed technique with the exercises the patient has been going over for a few weeks at this time. He does not have much carryover as far as tehcnique goes between visits. Therapy added thomas stretch today but the patient will need dseveral more visits of practice before he does it at home.    Rehab Potential Good   PT Frequency 2x / week   PT Duration 8 weeks   PT Treatment/Interventions ADLs/Self Care Home Management;Cryotherapy;Electrical Stimulation;Iontophoresis '4mg'$ /ml Dexamethasone;Gait training;Moist Heat;Ultrasound;Patient/family education;Therapeutic exercise;Therapeutic activities;Manual techniques;Passive range of motion;Taping;Neuromuscular re-education   PT Next Visit Plan continue to review exercises for technique. He has pictures and eductation on technique but he does not demsotrate much carryover.  continue with single leg stability exercises as tolerated. Review ankle exercises again next visit. Consdioer revieweing eccentric step ddown and lateral band walk.  Continue manual therapy.    PT Home Exercise Plan hamstring stretch, bride with abduction, spine extension; pec stretch, lat pull down; row machine; standing  flexion and scaption    Consulted and Agree with Plan of Care Patient      Patient will benefit from skilled therapeutic intervention in order to improve the following deficits and impairments:  Decreased activity tolerance, Decreased strength, Difficulty walking, Increased muscle spasms, Postural dysfunction, Impaired UE functional use  Visit Diagnosis: Chronic left shoulder pain  Acute right-sided low back pain without sciatica  Muscle weakness (generalized)  Muscle weakness     Problem List Patient Active Problem List   Diagnosis Date Noted  . Pain in shin 10/28/2016  . Left wrist pain 07/12/2016  . Lumbosacral radiculopathy at S1 03/15/2016  . Lumbar degenerative disc disease 12/18/2015  . Subacromial impingement of left shoulder 10/26/2015  . Left lumbar radiculitis 04/21/2015  . HNP (herniated nucleus pulposus), lumbar 04/21/2015  . Chondromalacia of both patellae 02/24/2015  . Paranoid schizophrenia, chronic condition ([place]) 02/24/2015  . Chronic low back pain 07/18/2014  . Knee pain, acute 07/18/2014  . Right ankle pain 01/27/2014  . Pain in joint, ankle and foot 01/27/2014  . Sciatica 01/10/2012  . Thoracic or lumbosacral neuritis or radiculitis, unspecified 12/15/2011  . Ulnar neuropathy at elbow 11/14/2011  . Disturbance of skin sensation 11/14/2011    Carney Living PT DPT  01/11/2017, 1:24 PM  [facility] [address] [place], [name], [address] Phone: [phone]   Fax:  [phone]  Name: Glenn Robertson MRN: [id number] Date of Birth: July 06, 1966

## 2017-01-12 DIAGNOSIS — Z79899 Other long term (current) drug therapy: Secondary | ICD-10-CM | POA: Diagnosis not present

## 2017-01-12 DIAGNOSIS — F200 Paranoid schizophrenia: Secondary | ICD-10-CM | POA: Diagnosis not present

## 2017-01-13 ENCOUNTER — Ambulatory Visit: Payer: Medicare Other | Admitting: Physical Therapy

## 2017-01-13 ENCOUNTER — Encounter: Payer: Self-pay | Admitting: Physical Therapy

## 2017-01-13 DIAGNOSIS — G8929 Other chronic pain: Secondary | ICD-10-CM | POA: Diagnosis not present

## 2017-01-13 DIAGNOSIS — M6281 Muscle weakness (generalized): Secondary | ICD-10-CM | POA: Diagnosis not present

## 2017-01-13 DIAGNOSIS — M545 Low back pain, unspecified: Secondary | ICD-10-CM

## 2017-01-13 DIAGNOSIS — M25512 Pain in left shoulder: Secondary | ICD-10-CM | POA: Diagnosis not present

## 2017-01-13 NOTE — Therapy (Signed)
[place], [name], [address] Phone: [phone]   Fax:  [phone]  Physical Therapy Treatment  Patient Details  Name: Glenn Robertson MRN: [id number] Date of Birth: 09/11/1965 Referring Provider: Dr Lilia Argue   Encounter Date: 01/13/2017      PT End of Session - 01/13/17 1148    Visit Number 6   Number of Visits 16   Date for PT Re-Evaluation 02/08/17   Authorization Type Medicare    PT Start Time 0845   PT Stop Time 0930   PT Time Calculation (min) 45 min   Activity Tolerance Patient tolerated treatment well   Behavior During Therapy [facility] for tasks assessed/performed      Past Medical History:  Diagnosis Date  . Allergy   . Asthma   . Chronic pain   . Depression   . Diabetes mellitus   . Neuromuscular disorder [facility])     Past Surgical History:  Procedure Laterality Date  . Arm Surgery  1/12   rt ulnar nerve decompression  . EPIDURAL BLOCK INJECTION     several  . ULNAR NERVE TRANSPOSITION  01/19/2012   Procedure: ULNAR NERVE DECOMPRESSION/TRANSPOSITION;  Surgeon: Cammie Sickle., MD;  Location: [facility];  Service: Orthopedics;  Laterality: Left;  ulnar nerve decompression vs transposition left elbow    There were no vitals filed for this visit.      Subjective Assessment - 01/13/17 0853    Subjective Patient feels like his back is about the same. His shoulder has not hurt as much as long as he avoids the machines that make his shoulder hurt.    Limitations Standing;Walking   How long can you sit comfortably? No limit   How long can you stand comfortably? He has pain at times but it is inconsitent    How long can you walk comfortably? No limit walking, pain with running    Currently in Pain? Yes   Pain Score 4    Pain Location Back   Pain Orientation Right   Pain Descriptors / Indicators Aching   Pain Type Chronic pain   Pain Onset More than a month ago   Pain Frequency Intermittent   Aggravating Factors  running    Pain Relieving Factors rest   Effect of Pain on Daily Activities difficulty perfroming ADL's                                  PT Education - 01/13/17 0855    Education provided Yes   Education Details continue education on symptom mangement and the importance of activity pogression    Person(s) Educated Patient   Methods Explanation;Demonstration;Tactile cues;Verbal cues   Comprehension Verbalized understanding;Returned demonstration          PT Short Term Goals - 01/04/17 1644      PT SHORT TERM GOAL #1   Title Patient will demsotrate full pain free left shoulder ROM    Baseline continues to have pain at end range shoulder motion    Time 4   Period Weeks   Status On-going     PT SHORT TERM GOAL #2   Title Patient will demsotrate 5/5 left shoulder and scapular strength    Baseline continue to work on strengthening    Time 4   Period Weeks   Status On-going     PT SHORT TERM GOAL #3  Title Patient will demonstrate 5/5 bilateral hip flexion    Time 4   Period Weeks   Status On-going     PT SHORT TERM GOAL #4   Title Patient will be independent with inital HEP for strengthening    Time 4   Period Weeks   Status On-going           PT Long Term Goals - 01/11/17 1322      PT LONG TERM GOAL #1   Title Patient will demsotrate a 31% deficit on FOTO for his left shoulder    Time 8   Status On-going     PT LONG TERM GOAL #2   Title Patient will perform shoulder gym activity without increased pain    Baseline perfroming the wrong activity    Time 8   Period Weeks   Status On-going     PT LONG TERM GOAL #3   Title Patient will run for 20 minutes without self reported increased SI and back pain    Baseline 4/5   Time 8   Period Weeks   Status On-going               Plan - 01/13/17 0944    Clinical Impression Statement Therapy reviewed his running exercises. He  tolerated them well. He was given his stretches for home because he is not sure were they are. Therapy continues to focus on manual therapy. He has a large tirgger point around the sacral area.     Rehab Potential Good   PT Frequency 2x / week   PT Duration 8 weeks   PT Treatment/Interventions ADLs/Self Care Home Management;Cryotherapy;Electrical Stimulation;Iontophoresis 4mg /ml Dexamethasone;Gait training;Moist Heat;Ultrasound;Patient/family education;Therapeutic exercise;Therapeutic activities;Manual techniques;Passive range of motion;Taping;Neuromuscular re-education   PT Next Visit Plan Consider review of Gym exercises for the shoulder. Continue manual therapy    PT Home Exercise Plan hamstring stretch, bride with abduction, spine extension; pec stretch, lat pull down; row machine; standing flexion and scaption    Consulted and Agree with Plan of Care Patient      Patient will benefit from skilled therapeutic intervention in order to improve the following deficits and impairments:  Decreased activity tolerance, Decreased strength, Difficulty walking, Increased muscle spasms, Postural dysfunction, Impaired UE functional use  Visit Diagnosis: Chronic left shoulder pain  Acute right-sided low back pain without sciatica  Muscle weakness (generalized)  Muscle weakness     Problem List Patient Active Problem List   Diagnosis Date Noted  . Pain in shin 10/28/2016  . Left wrist pain 07/12/2016  . Lumbosacral radiculopathy at S1 03/15/2016  . Lumbar degenerative disc disease 12/18/2015  . Subacromial impingement of left shoulder 10/26/2015  . Left lumbar radiculitis 04/21/2015  . HNP (herniated nucleus pulposus), lumbar 04/21/2015  . Chondromalacia of both patellae 02/24/2015  . Paranoid schizophrenia, chronic condition ([place]) 02/24/2015  . Chronic low back pain 07/18/2014  . Knee pain, acute 07/18/2014  . Right ankle pain 01/27/2014  . Pain in joint, ankle and foot 01/27/2014  .  Sciatica 01/10/2012  . Thoracic or lumbosacral neuritis or radiculitis, unspecified 12/15/2011  . Ulnar neuropathy at elbow 11/14/2011  . Disturbance of skin sensation 11/14/2011    Carney Living PT DPT  01/13/2017, 11:52 AM  [facility] [address] [place], [name], [address] Phone: [phone]   Fax:  [phone]  Name: Aaiden Depoy MRN: [id number] Date of Birth: December 24, 1965

## 2017-01-16 ENCOUNTER — Ambulatory Visit: Payer: Medicare Other | Admitting: Physical Therapy

## 2017-01-16 DIAGNOSIS — M545 Low back pain, unspecified: Secondary | ICD-10-CM

## 2017-01-16 DIAGNOSIS — G8929 Other chronic pain: Secondary | ICD-10-CM

## 2017-01-16 DIAGNOSIS — M25512 Pain in left shoulder: Principal | ICD-10-CM

## 2017-01-16 DIAGNOSIS — M6281 Muscle weakness (generalized): Secondary | ICD-10-CM | POA: Diagnosis not present

## 2017-01-16 NOTE — Therapy (Signed)
[place], [name], [address] Phone: [phone]   Fax:  [phone]  Physical Therapy Treatment  Patient Details  Name: Glenn Robertson MRN: [id number] Date of Birth: 1966/02/05 Referring Provider: Dr Lilia Argue   Encounter Date: 01/16/2017      PT End of Session - 01/16/17 0825    Visit Number 7   Number of Visits 16   Date for PT Re-Evaluation 02/08/17   Authorization Type Medicare    PT Start Time 0800   PT Stop Time 0845   PT Time Calculation (min) 45 min      Past Medical History:  Diagnosis Date  . Allergy   . Asthma   . Chronic pain   . Depression   . Diabetes mellitus   . Neuromuscular disorder [facility])     Past Surgical History:  Procedure Laterality Date  . Arm Surgery  1/12   rt ulnar nerve decompression  . EPIDURAL BLOCK INJECTION     several  . ULNAR NERVE TRANSPOSITION  01/19/2012   Procedure: ULNAR NERVE DECOMPRESSION/TRANSPOSITION;  Surgeon: Cammie Sickle., MD;  Location: [facility];  Service: Orthopedics;  Laterality: Left;  ulnar nerve decompression vs transposition left elbow    There were no vitals filed for this visit.      Subjective Assessment - 01/16/17 0805    Currently in Pain? Yes   Pain Score 4    Pain Location Back   Pain Orientation Right   Pain Descriptors / Indicators Aching   Aggravating Factors  jogging    Pain Relieving Factors rest                          [facility] Adult PT Treatment/Exercise - 01/16/17 0001      Lumbar Exercises: Supine   Ab Set 15 reps   AB Set Limitations with ball squeeze    Clam 20 reps   Clam Limitations green with ab set    Bent Knee Raise Limitations scissors Level 2    Bridge Limitations with with green band clams,    Other Supine Lumbar Exercises ankle 3 way green 2x10. Mod cuing for technique   seated option      Lumbar Exercises: Prone   Other Prone Lumbar Exercises plamk 1  min;      Modalities   Modalities Iontophoresis;Ultrasound     Ultrasound   Ultrasound Location Right SI joint   Ultrasound Parameters 50% 1.0 w/cm2 x 8 minutes    Ultrasound Goals Pain     Iontophoresis   Type of Iontophoresis Dexamethasone   Location Right SI   Dose 1.0 ml   Time 6 hours      Manual Therapy   Manual therapy comments MET to correct left posteriorly rotated inominate, 2 bouts , sidelying , long axis distraction                  PT Short Term Goals - 01/04/17 1644      PT SHORT TERM GOAL #1   Title Patient will demsotrate full pain free left shoulder ROM    Baseline continues to have pain at end range shoulder motion    Time 4   Period Weeks   Status On-going     PT SHORT TERM GOAL #2   Title Patient will demsotrate 5/5 left shoulder and scapular strength    Baseline continue to work on Hotel manager  Time 4   Period Weeks   Status On-going     PT SHORT TERM GOAL #3   Title Patient will demonstrate 5/5 bilateral hip flexion    Time 4   Period Weeks   Status On-going     PT SHORT TERM GOAL #4   Title Patient will be independent with inital HEP for strengthening    Time 4   Period Weeks   Status On-going           PT Long Term Goals - 01/11/17 1322      PT LONG TERM GOAL #1   Title Patient will demsotrate a 31% deficit on FOTO for his left shoulder    Time 8   Status On-going     PT LONG TERM GOAL #2   Title Patient will perform shoulder gym activity without increased pain    Baseline perfroming the wrong activity    Time 8   Period Weeks   Status On-going     PT LONG TERM GOAL #3   Title Patient will run for 20 minutes without self reported increased SI and back pain    Baseline 4/5   Time 8   Period Weeks   Status On-going               Plan - 01/16/17 1052    Clinical Impression Statement Pt reports he cannot grasp ankle exercises at home. Instructed pt in seated while attaching band to table leg. Pt  able to perform 2 sets of each exercises with min cues. Trial of US/ [place] to Right SI.    PT Next Visit Plan CHECK goals; Assess response to US/ IONTO  Consider review of Gym exercises for the shoulder. Continue manual therapy    PT Home Exercise Plan hamstring stretch, bride with abduction, spine extension; pec stretch, lat pull down; row machine; standing flexion and scaption    Consulted and Agree with Plan of Care Patient      Patient will benefit from skilled therapeutic intervention in order to improve the following deficits and impairments:  Decreased activity tolerance, Decreased strength, Difficulty walking, Increased muscle spasms, Postural dysfunction, Impaired UE functional use  Visit Diagnosis: Chronic left shoulder pain  Acute right-sided low back pain without sciatica  Muscle weakness (generalized)     Problem List Patient Active Problem List   Diagnosis Date Noted  . Pain in shin 10/28/2016  . Left wrist pain 07/12/2016  . Lumbosacral radiculopathy at S1 03/15/2016  . Lumbar degenerative disc disease 12/18/2015  . Subacromial impingement of left shoulder 10/26/2015  . Left lumbar radiculitis 04/21/2015  . HNP (herniated nucleus pulposus), lumbar 04/21/2015  . Chondromalacia of both patellae 02/24/2015  . Paranoid schizophrenia, chronic condition ([place]) 02/24/2015  . Chronic low back pain 07/18/2014  . Knee pain, acute 07/18/2014  . Right ankle pain 01/27/2014  . Pain in joint, ankle and foot 01/27/2014  . Sciatica 01/10/2012  . Thoracic or lumbosacral neuritis or radiculitis, unspecified 12/15/2011  . Ulnar neuropathy at elbow 11/14/2011  . Disturbance of skin sensation 11/14/2011    Dorene Ar, PTA 01/16/2017, 10:59 AM  [facility] [address] [place], [name], [address] Phone: [phone]   Fax:  [phone]  Name: Glenn Robertson MRN: [id number] Date of Birth: 1966-06-20

## 2017-01-18 ENCOUNTER — Ambulatory Visit: Payer: Medicare Other | Admitting: Physical Therapy

## 2017-01-18 DIAGNOSIS — G8929 Other chronic pain: Secondary | ICD-10-CM | POA: Diagnosis not present

## 2017-01-18 DIAGNOSIS — M25512 Pain in left shoulder: Secondary | ICD-10-CM | POA: Diagnosis not present

## 2017-01-18 DIAGNOSIS — M545 Low back pain, unspecified: Secondary | ICD-10-CM

## 2017-01-18 DIAGNOSIS — M6281 Muscle weakness (generalized): Secondary | ICD-10-CM | POA: Diagnosis not present

## 2017-01-18 NOTE — Therapy (Signed)
[place], [name], [address] Phone: [phone]   Fax:  [phone]  Physical Therapy Treatment  Patient Details  Name: Glenn Robertson MRN: [id number] Date of Birth: 02-18-1966 Referring Provider: Dr Lilia Argue   Encounter Date: 01/18/2017      PT End of Session - 01/18/17 0805    Visit Number 8   Number of Visits 16   Date for PT Re-Evaluation 02/08/17   Authorization Type Medicare    PT Start Time 0800   PT Stop Time 0846   PT Time Calculation (min) 46 min      Past Medical History:  Diagnosis Date  . Allergy   . Asthma   . Chronic pain   . Depression   . Diabetes mellitus   . Neuromuscular disorder [facility])     Past Surgical History:  Procedure Laterality Date  . Arm Surgery  1/12   rt ulnar nerve decompression  . EPIDURAL BLOCK INJECTION     several  . ULNAR NERVE TRANSPOSITION  01/19/2012   Procedure: ULNAR NERVE DECOMPRESSION/TRANSPOSITION;  Surgeon: Cammie Sickle., MD;  Location: [facility];  Service: Orthopedics;  Laterality: Left;  ulnar nerve decompression vs transposition left elbow    There were no vitals filed for this visit.      Subjective Assessment - 01/18/17 0804    Subjective I still felt the same pain with jogging 2 days ago. I went walking yesterday for 2 miles.    Currently in Pain? No/denies            [facility] PT Assessment - 01/18/17 0001      Observation/Other Assessments   Focus on Therapeutic Outcomes (FOTO)  31% limitation     AROM   Overall AROM Comments no pain with active ROm of left shoulder, full     Strength   Left Shoulder Flexion 5/5  pain   Left Shoulder ABduction 5/5  pain   Left Shoulder External Rotation 5/5  pain   Right Hip Flexion 4+/5   Left Hip Flexion 4+/5                     [facility] Adult PT Treatment/Exercise - 01/18/17 0001      Lumbar Exercises: Stretches   Single Knee to Chest Stretch  30 seconds   Double Knee to Chest Stretch 2 reps;30 seconds   Prone on Elbows Stretch 1 rep   Press Ups 5 reps;10 seconds   Piriformis Stretch 2 reps;30 seconds   Piriformis Stretch Limitations figure 4 stretch   supine and seated      Lumbar Exercises: Supine   Ab Set 15 reps   AB Set Limitations with ball squeeze    Clam 20 reps   Clam Limitations green with ab set    Bent Knee Raise Limitations scissors Level 2    Bridge 10 reps;20 reps   Bridge Limitations with with green band clams, with ball squeeze      Lumbar Exercises: Prone   Other Prone Lumbar Exercises plamk 1 min;      Knee/Hip Exercises: Aerobic   Elliptical Ramp 10 , resistance 3 x 5 minutes, c/o shoulder pain using arms      Iontophoresis   Type of Iontophoresis Dexamethasone   Location Right SI   Dose 1.0 Ml    Time  6 hours      Manual Therapy   Manual therapy comments Soft tissue worl  tight piriformis with PROM  hip IR/ER prone                   PT Short Term Goals - 01/18/17 0814      PT SHORT TERM GOAL #1   Title Patient will demsotrate full pain free left shoulder ROM    Time 4   Period Weeks   Status Achieved     PT SHORT TERM GOAL #2   Title Patient will demsotrate 5/5 left shoulder and scapular strength    Baseline 5/5 flex, abdct, ER    Time 4   Period Weeks   Status Partially Met     PT SHORT TERM GOAL #3   Title Patient will demonstrate 5/5 bilateral hip flexion    Baseline 4+/5 bilateral hip flexion    Time 4   Period Weeks   Status On-going     PT SHORT TERM GOAL #4   Title Patient will be independent with inital HEP for strengthening    Baseline tries to remember to do them    Time 4   Period Weeks   Status On-going           PT Long Term Goals - 01/11/17 1322      PT LONG TERM GOAL #1   Title Patient will demsotrate a 31% deficit on FOTO for his left shoulder    Time 8   Status On-going     PT LONG TERM GOAL #2   Title Patient will perform shoulder  gym activity without increased pain    Baseline perfroming the wrong activity    Time 8   Period Weeks   Status On-going     PT LONG TERM GOAL #3   Title Patient will run for 20 minutes without self reported increased SI and back pain    Baseline 4/5   Time 8   Period Weeks   Status On-going               Plan - 01/18/17 9470    Clinical Impression Statement Shoulder strength improved however painful with MMT. Shoulder ROM is full and pain free. Hip flexion strength has improved to 4+/5. FOTO score improved to 31% limited for shoulder. STG#1 met, #2 partially met.  Pt did not try theraband ankle exercises, he could not find anywhere to tie the band. We focused lumbar and hip stabilization. Also performed soft tissue work to right piriformis. Added figure 4 stretch for flexibility. Repeated Ionto patch. No increased pain.     PT Next Visit Plan  Assess response to US/ IONTO  Consider review of Gym exercises for the shoulder. Continue manual therapy right piriformis ?tennis ball    PT Home Exercise Plan hamstring stretch, bride with abduction, spine extension; pec stretch, lat pull down; row machine; standing flexion and scaption    Consulted and Agree with Plan of Care Patient      Patient will benefit from skilled therapeutic intervention in order to improve the following deficits and impairments:  Decreased activity tolerance, Decreased strength, Difficulty walking, Increased muscle spasms, Postural dysfunction, Impaired UE functional use  Visit Diagnosis: Chronic left shoulder pain  Acute right-sided low back pain without sciatica  Muscle weakness (generalized)     Problem List Patient Active Problem List   Diagnosis Date Noted  . Pain in shin 10/28/2016  . Left wrist pain 07/12/2016  . Lumbosacral radiculopathy at S1 03/15/2016  . Lumbar degenerative disc disease 12/18/2015  . Subacromial  impingement of left shoulder 10/26/2015  . Left lumbar radiculitis  04/21/2015  . HNP (herniated nucleus pulposus), lumbar 04/21/2015  . Chondromalacia of both patellae 02/24/2015  . Paranoid schizophrenia, chronic condition ([place]) 02/24/2015  . Chronic low back pain 07/18/2014  . Knee pain, acute 07/18/2014  . Right ankle pain 01/27/2014  . Pain in joint, ankle and foot 01/27/2014  . Sciatica 01/10/2012  . Thoracic or lumbosacral neuritis or radiculitis, unspecified 12/15/2011  . Ulnar neuropathy at elbow 11/14/2011  . Disturbance of skin sensation 11/14/2011    Dorene Ar, PTA 01/18/2017, 9:06 AM  [facility] [address] [place], [name], [address] Phone: [phone]   Fax:  [phone]  Name: Glenn Robertson MRN: [id number] Date of Birth: 05/17/1966

## 2017-01-19 ENCOUNTER — Ambulatory Visit (INDEPENDENT_AMBULATORY_CARE_PROVIDER_SITE_OTHER): Payer: Medicare Other | Admitting: Sports Medicine

## 2017-01-19 ENCOUNTER — Encounter: Payer: Self-pay | Admitting: Sports Medicine

## 2017-01-19 VITALS — BP 120/70 | Ht 70.0 in | Wt 202.0 lb

## 2017-01-19 DIAGNOSIS — M25551 Pain in right hip: Secondary | ICD-10-CM

## 2017-01-19 NOTE — Progress Notes (Signed)
   Subjective:    Patient ID: Glenn Robertson, male    DOB: 1966-05-28, 51 y.o.   MRN: [id number]  HPI   Patient comes in today with persistent posterior right hip pain. Pain persists despite physical therapy. Worse with activity such as walking. He denies any groin pain. He does get some radiating pain in the posterior aspect of his right leg but no associated numbness or tingling. He does have a history of sciatica and is currently awaiting MRI scans of his lumbar spine which have been ordered by neurology.    Review of Systems    as above Objective:   Physical Exam  Well-developed, well-nourished. No acute distress  Right hip: Smooth painless hip range of motion with a negative log roll. He is tender to palpation diffusely along the right side of the lumbosacral spine. Some tenderness over the SI joint with a negative FABER. Negative straight leg raise. Neurovascularly intact distally.      Assessment & Plan:   Persistent right hip pain likely radiating from the lumbar spine  Although he doesn't have true sciatica I think his posterior right hip pain is from a lumbar disc pathology. He is currently awaiting an MRI scan of his lumbar spine which has been ordered by his neurologist. I've asked him to call me with those results when available. Of note, his left shoulder pain is slowly improving. He is avoiding certain exercises that cause pain. I recommended that he try some capsaicin cream as needed.

## 2017-01-25 ENCOUNTER — Encounter: Payer: Self-pay | Admitting: Physical Therapy

## 2017-01-25 ENCOUNTER — Ambulatory Visit: Payer: Medicare Other | Admitting: Physical Therapy

## 2017-01-25 DIAGNOSIS — M545 Low back pain, unspecified: Secondary | ICD-10-CM

## 2017-01-25 DIAGNOSIS — G8929 Other chronic pain: Secondary | ICD-10-CM

## 2017-01-25 DIAGNOSIS — M6281 Muscle weakness (generalized): Secondary | ICD-10-CM | POA: Diagnosis not present

## 2017-01-25 DIAGNOSIS — M25512 Pain in left shoulder: Principal | ICD-10-CM

## 2017-01-25 NOTE — Therapy (Signed)
[place], [name], [address] Phone: [phone]   Fax:  [phone]  Physical Therapy Treatment  Patient Details  Name: Glenn Robertson MRN: [id number] Date of Birth: December 11, 1965 Referring Provider: Dr Lilia Argue   Encounter Date: 01/25/2017      PT End of Session - 01/25/17 0952    Visit Number 9   Number of Visits 16   Date for PT Re-Evaluation 02/08/17   Authorization Type Medicare    PT Start Time 0933   PT Stop Time 1015   PT Time Calculation (min) 42 min   Activity Tolerance Patient tolerated treatment well   Behavior During Therapy [facility] for tasks assessed/performed      Past Medical History:  Diagnosis Date  . Allergy   . Asthma   . Chronic pain   . Depression   . Diabetes mellitus   . Neuromuscular disorder [facility])     Past Surgical History:  Procedure Laterality Date  . Arm Surgery  1/12   rt ulnar nerve decompression  . EPIDURAL BLOCK INJECTION     several  . ULNAR NERVE TRANSPOSITION  01/19/2012   Procedure: ULNAR NERVE DECOMPRESSION/TRANSPOSITION;  Surgeon: Cammie Sickle., MD;  Location: [facility];  Service: Orthopedics;  Laterality: Left;  ulnar nerve decompression vs transposition left elbow    There were no vitals filed for this visit.      Subjective Assessment - 01/25/17 0943    Subjective Patient Glenn Robertson today. He reports he can not tel if it is any better. He went to the MD. He will be having MRI's soon    Limitations Standing;Walking   How long can you sit comfortably? No limit   How long can you stand comfortably? He has pain at times but it is inconsitent    How long can you walk comfortably? No limit walking, pain with running    Currently in Pain? No/denies                         [facility] Adult PT Treatment/Exercise - 01/25/17 0001      Lumbar Exercises: Stretches   Passive Hamstring Stretch Limitations 2x30sec each    Piriformis Stretch 2 reps;30 seconds     Lumbar Exercises: Supine   Bridge Limitations with with green band clams sustained bridge 2x10; Bridge with march 2x10;    Other Supine Lumbar Exercises thomas stretch 2x30 second      Shoulder Exercises: Standing   Other Standing Exercises standing flexion and scaption 2x10 3lb; Shoulder pess slightly anteriro with internal rotation at 90 degrees.      Manual Therapy   Manual therapy comments PA mobilization to the scarum; Gentle right leg distraction; soft tissue mobilization to right lumbar paraspinals. Grade 5 Long axis thrust manuever 3x without cavitation; ER/IR MET without caviation 4x; LAD x 3 minutes; No increase in pain noted. PA and AP mobilization of the hshoulder to decrease pain Grade 2 and 3 mobilizations                  PT Education - 01/25/17 0945    Education provided Yes   Education Details improtance of stability when running    Person(s) Educated Patient   Methods Explanation;Demonstration;Tactile cues   Comprehension Verbalized understanding;Returned demonstration          PT Short Term Goals - 01/25/17 1004      PT SHORT TERM GOAL #  1   Title Patient will demsotrate full pain free left shoulder ROM    Baseline no pain at end range today    Time 4   Period Weeks   Status Achieved     PT SHORT TERM GOAL #2   Title Patient will demsotrate 5/5 left shoulder and scapular strength    Baseline 5/5 flex, abdct, ER    Time 4   Period Weeks   Status Partially Met     PT SHORT TERM GOAL #3   Title Patient will demonstrate 5/5 bilateral hip flexion    Baseline 4+/5 bilateral hip flexion    Time 4   Period Weeks   Status On-going     PT SHORT TERM GOAL #4   Title Patient will be independent with inital HEP for strengthening    Baseline tries to remember to do them    Time 4   Period Weeks   Status On-going           PT Long Term Goals - 01/11/17 1322      PT LONG TERM GOAL #1   Title Patient will  demsotrate a 31% deficit on FOTO for his left shoulder    Time 8   Status On-going     PT LONG TERM GOAL #2   Title Patient will perform shoulder gym activity without increased pain    Baseline perfroming the wrong activity    Time 8   Period Weeks   Status On-going     PT LONG TERM GOAL #3   Title Patient will run for 20 minutes without self reported increased SI and back pain    Baseline 4/5   Time 8   Period Weeks   Status On-going               Plan - 01/25/17 1001    Clinical Impression Statement Patients area of tenderness and spasming has decreaseind in the right side of his lumbar spine. He hads improved movement in his SI joint. He tolerated exercises.    Clinical Presentation Stable   Clinical Decision Making Low   Rehab Potential Good   PT Frequency 2x / week   PT Duration 8 weeks   PT Treatment/Interventions ADLs/Self Care Home Management;Cryotherapy;Electrical Stimulation;Iontophoresis 26m/ml Dexamethasone;Gait training;Moist Heat;Ultrasound;Patient/family education;Therapeutic exercise;Therapeutic activities;Manual techniques;Passive range of motion;Taping;Neuromuscular re-education   PT Next Visit Plan  Assess response to US/ IONTO  continue to progress gym exercsies; continue manual therapy.    PT Home Exercise Plan hamstring stretch, bride with abduction, spine extension; pec stretch, lat pull down; row machine; standing flexion and scaption    Consulted and Agree with Plan of Care Patient      Patient will benefit from skilled therapeutic intervention in order to improve the following deficits and impairments:  Decreased activity tolerance, Decreased strength, Difficulty walking, Increased muscle spasms, Postural dysfunction, Impaired UE functional use  Visit Diagnosis: Chronic left shoulder pain  Acute right-sided low back pain without sciatica  Muscle weakness (generalized)  Muscle weakness     Problem List Patient Active Problem List    Diagnosis Date Noted  . Pain in shin 10/28/2016  . Left wrist pain 07/12/2016  . Lumbosacral radiculopathy at S1 03/15/2016  . Lumbar degenerative disc disease 12/18/2015  . Subacromial impingement of left shoulder 10/26/2015  . Left lumbar radiculitis 04/21/2015  . HNP (herniated nucleus pulposus), lumbar 04/21/2015  . Chondromalacia of both patellae 02/24/2015  . Paranoid schizophrenia, chronic condition ([place] 02/24/2015  .  Chronic low back pain 07/18/2014  . Knee pain, acute 07/18/2014  . Right ankle pain 01/27/2014  . Pain in joint, ankle and foot 01/27/2014  . Sciatica 01/10/2012  . Thoracic or lumbosacral neuritis or radiculitis, unspecified 12/15/2011  . Ulnar neuropathy at elbow 11/14/2011  . Disturbance of skin sensation 11/14/2011    Carney Living PT DPT  01/25/2017, 12:31 PM  [facility] [address] [place], [name], [address] Phone: [phone]   Fax:  [phone]  Name: Glenn Robertson MRN: [id number] Date of Birth: [birth year]

## 2017-01-26 ENCOUNTER — Ambulatory Visit: Payer: Medicare Other | Admitting: Physical Therapy

## 2017-01-26 DIAGNOSIS — M545 Low back pain, unspecified: Secondary | ICD-10-CM

## 2017-01-26 DIAGNOSIS — M25512 Pain in left shoulder: Secondary | ICD-10-CM | POA: Diagnosis not present

## 2017-01-26 DIAGNOSIS — G8929 Other chronic pain: Secondary | ICD-10-CM | POA: Diagnosis not present

## 2017-01-26 DIAGNOSIS — M6281 Muscle weakness (generalized): Secondary | ICD-10-CM

## 2017-01-26 NOTE — Therapy (Addendum)
[place], [name], [address] Phone: [phone]   Fax:  [phone]  Physical Therapy Treatment  Patient Details  Name: Glenn Robertson MRN: [id number] Date of Birth: 1966/08/23 Referring Provider: Dr Lilia Argue   Encounter Date: 01/26/2017      PT End of Session - 01/26/17 0807    Visit Number 10   Number of Visits 16   Date for PT Re-Evaluation 02/08/17   Authorization Type Medicare    PT Start Time 0800   PT Stop Time 0845   PT Time Calculation (min) 45 min      Past Medical History:  Diagnosis Date  . Allergy   . Asthma   . Chronic pain   . Depression   . Diabetes mellitus   . Neuromuscular disorder [facility])     Past Surgical History:  Procedure Laterality Date  . Arm Surgery  1/12   rt ulnar nerve decompression  . EPIDURAL BLOCK INJECTION     several  . ULNAR NERVE TRANSPOSITION  01/19/2012   Procedure: ULNAR NERVE DECOMPRESSION/TRANSPOSITION;  Surgeon: Cammie Sickle., MD;  Location: [facility];  Service: Orthopedics;  Laterality: Left;  ulnar nerve decompression vs transposition left elbow    There were no vitals filed for this visit.      Subjective Assessment - 01/26/17 0806    Subjective I ran 1/3 of a mile right before I got here. I had 3/10 back pain while running.    Currently in Pain? No/denies   Aggravating Factors  jogging   Pain Relieving Factors rest                          [facility] Adult PT Treatment/Exercise - 01/26/17 0001      Lumbar Exercises: Stretches   Single Knee to Chest Stretch 2 reps;30 seconds   Double Knee to Chest Stretch 2 reps;30 seconds   Press Ups 5 reps;10 seconds   Piriformis Stretch 2 reps;30 seconds   Piriformis Stretch Limitations figure 4 stretch   supine and seated     Lumbar Exercises: Supine   Bent Knee Raise Limitations scissors Level 2   2 bouts    Straight Leg Raise 10 reps   Straight Leg  Raises Limitations with abdominal brace    Other Supine Lumbar Exercises thomas stretch 2x30 second      Lumbar Exercises: Prone   Opposite Arm/Leg Raise 10 reps   Other Prone Lumbar Exercises plank 90 sec     Knee/Hip Exercises: Stretches   Other Knee/Hip Stretches Side lying QL stretch over pillow with strumming to right QL      Knee/Hip Exercises: Aerobic   Elliptical Ramp 10 resistance 5 x 5 minutes   Concurrent with HEP education     Shoulder Exercises: Prone   Other Prone Exercises Prone I T, bent T, Y x 10 each, fatigues quickly, no shoulder pain.      Manual Therapy   Manual therapy comments soft tissue work right lumbar paraspinals, right QL                 PT Education - 01/25/17 0945    Education provided Yes   Education Details improtance of stability when running    Person(s) Educated Patient   Methods Explanation;Demonstration;Tactile cues   Comprehension Verbalized understanding;Returned demonstration          PT Short Term Goals - 01/25/17 1004  PT SHORT TERM GOAL #1   Title Patient will demsotrate full pain free left shoulder ROM    Baseline no pain at end range today    Time 4   Period Weeks   Status Achieved     PT SHORT TERM GOAL #2   Title Patient will demsotrate 5/5 left shoulder and scapular strength    Baseline 5/5 flex, abdct, ER    Time 4   Period Weeks   Status Partially Met     PT SHORT TERM GOAL #3   Title Patient will demonstrate 5/5 bilateral hip flexion    Baseline 4+/5 bilateral hip flexion    Time 4   Period Weeks   Status On-going     PT SHORT TERM GOAL #4   Title Patient will be independent with inital HEP for strengthening    Baseline tries to remember to do them    Time 4   Period Weeks   Status On-going           PT Long Term Goals - 01/11/17 1322      PT LONG TERM GOAL #1   Title Patient will demsotrate a 31% deficit on FOTO for his left shoulder    Time 8   Status On-going     PT LONG TERM  GOAL #2   Title Patient will perform shoulder gym activity without increased pain    Baseline perfroming the wrong activity    Time 8   Period Weeks   Status On-going     PT LONG TERM GOAL #3   Title Patient will run for 20 minutes without self reported increased SI and back pain    Baseline 4/5   Time 8   Period Weeks   Status On-going      G Code: Mobility walking around  Current CJ 20-40%  Goal CL 1-20 %          Plan - 01/26/17 0855    Clinical Impression Statement Challenged upper back muscles with pt demonstrating decreased strength in prone. Challenged core with decreased motor control noted. Able to hold 90 second plank.    PT Next Visit Plan continue manual therapy , continue gym exercise   PT Home Exercise Plan hamstring stretch, bride with abduction, spine extension; pec stretch, lat pull down; row machine; standing flexion and scaption    Consulted and Agree with Plan of Care Patient      Patient will benefit from skilled therapeutic intervention in order to improve the following deficits and impairments:  Decreased activity tolerance, Decreased strength, Difficulty walking, Increased muscle spasms, Postural dysfunction, Impaired UE functional use  Visit Diagnosis: Chronic left shoulder pain  Acute right-sided low back pain without sciatica  Muscle weakness (generalized)     Problem List Patient Active Problem List   Diagnosis Date Noted  . Pain in shin 10/28/2016  . Left wrist pain 07/12/2016  . Lumbosacral radiculopathy at S1 03/15/2016  . Lumbar degenerative disc disease 12/18/2015  . Subacromial impingement of left shoulder 10/26/2015  . Left lumbar radiculitis 04/21/2015  . HNP (herniated nucleus pulposus), lumbar 04/21/2015  . Chondromalacia of both patellae 02/24/2015  . Paranoid schizophrenia, chronic condition ([place]) 02/24/2015  . Chronic low back pain 07/18/2014  . Knee pain, acute 07/18/2014  . Right ankle pain 01/27/2014  . Pain in  joint, ankle and foot 01/27/2014  . Sciatica 01/10/2012  . Thoracic or lumbosacral neuritis or radiculitis, unspecified 12/15/2011  . Ulnar neuropathy at elbow 11/14/2011  .  Disturbance of skin sensation 11/14/2011    Dorene Ar, PTA 01/26/2017, 9:05 AM  [facility] [address] [place], [name], [address] Phone: [phone]   Fax:  [phone]  Name: Glenn Robertson MRN: [id number] Date of Birth: [birth year]

## 2017-02-01 ENCOUNTER — Telehealth: Payer: Self-pay | Admitting: *Deleted

## 2017-02-01 NOTE — Telephone Encounter (Signed)
Left message telling patient he's been seeing neurology and he informed us last visit that they were ordering MRIs of his spine (That includes his S1 Vertebra). He doesn't need an MRI of his SI joint. Also stated that he had an MRI of his spine done last year so his insurance may not pay for another one.

## 2017-02-03 ENCOUNTER — Other Ambulatory Visit: Payer: Self-pay | Admitting: Specialist

## 2017-02-03 DIAGNOSIS — M5417 Radiculopathy, lumbosacral region: Secondary | ICD-10-CM

## 2017-02-06 ENCOUNTER — Ambulatory Visit: Payer: Medicare Other | Attending: Sports Medicine | Admitting: Physical Therapy

## 2017-02-06 DIAGNOSIS — M25512 Pain in left shoulder: Secondary | ICD-10-CM | POA: Diagnosis not present

## 2017-02-06 DIAGNOSIS — M545 Low back pain, unspecified: Secondary | ICD-10-CM

## 2017-02-06 DIAGNOSIS — G8929 Other chronic pain: Secondary | ICD-10-CM | POA: Insufficient documentation

## 2017-02-06 DIAGNOSIS — M6281 Muscle weakness (generalized): Secondary | ICD-10-CM | POA: Diagnosis not present

## 2017-02-07 NOTE — Therapy (Signed)
[place], [name], [address] Phone: [phone]   Fax:  [phone]  Physical Therapy Treatment  Patient Details  Name: Glenn Robertson MRN: [id number] Date of Birth: 05-Oct-1965 Referring Provider: Dr Lilia Argue   Encounter Date: 02/06/2017      PT End of Session - 02/07/17 0811    Visit Number 11   Number of Visits 16   Date for PT Re-Evaluation 02/08/17   Authorization Type Medicare    PT Start Time 0345   PT Stop Time 0428   PT Time Calculation (min) 43 min   Activity Tolerance Patient tolerated treatment well   Behavior During Therapy [facility] for tasks assessed/performed      Past Medical History:  Diagnosis Date  . Allergy   . Asthma   . Chronic pain   . Depression   . Diabetes mellitus   . Neuromuscular disorder [facility])     Past Surgical History:  Procedure Laterality Date  . Arm Surgery  1/12   rt ulnar nerve decompression  . EPIDURAL BLOCK INJECTION     several  . ULNAR NERVE TRANSPOSITION  01/19/2012   Procedure: ULNAR NERVE DECOMPRESSION/TRANSPOSITION;  Surgeon: Cammie Sickle., MD;  Location: [place];  Service: Orthopedics;  Laterality: Left;  ulnar nerve decompression vs transposition left elbow    There were no vitals filed for this visit.      Subjective Assessment - 02/07/17 0807    Subjective Patient rpeorts his shoulders have been better. He continues to have lower back pain when he does his walking.   Limitations Standing;Walking   How long can you sit comfortably? No limit   How long can you stand comfortably? He has pain at times but it is inconsitent    How long can you walk comfortably? No limit walking, pain with running    Currently in Pain? No/denies                         [facility] Adult PT Treatment/Exercise - 02/07/17 0001      Self-Care   Self-Care Other Self-Care Comments   Other Self-Care Comments  took all patients  exercises and stremlined them into three programs for him to focus on. Extensive education provided on      Lumbar Exercises: Stretches   Single Knee to Chest Stretch 2 reps;30 seconds   Quad Stretch Limitations thomas stretch 3x20 sec    Piriformis Stretch 2 reps;30 seconds     Manual Therapy   Manual therapy comments PA mobilization to the scarum; Gentle right leg distraction; soft tissue mobilization to right lumbar paraspinals. Grade 5 Long axis thrust manuever 3x without cavitation; ER/IR MET without caviation 4x; LAD x 3 minutes; No increase in pain noted. PA and AP mobilization of the shoulder to decrease pain Grade 2 and 3 mobilizations                  PT Education - 02/07/17 0808    Education provided Yes   Education Details reviewed    Person(s) Educated Patient   Methods Explanation;Demonstration;Tactile cues   Comprehension Verbalized understanding;Returned demonstration          PT Short Term Goals - 01/25/17 1004      PT SHORT TERM GOAL #1   Title Patient will demsotrate full pain free left shoulder ROM    Baseline no pain at end range today    Time  4   Period Weeks   Status Achieved     PT SHORT TERM GOAL #2   Title Patient will demsotrate 5/5 left shoulder and scapular strength    Baseline 5/5 flex, abdct, ER    Time 4   Period Weeks   Status Partially Met     PT SHORT TERM GOAL #3   Title Patient will demonstrate 5/5 bilateral hip flexion    Baseline 4+/5 bilateral hip flexion    Time 4   Period Weeks   Status On-going     PT SHORT TERM GOAL #4   Title Patient will be independent with inital HEP for strengthening    Baseline tries to remember to do them    Time 4   Period Weeks   Status On-going           PT Long Term Goals - 02/07/17 0819      PT LONG TERM GOAL #1   Title Patient will demsotrate a 31% deficit on FOTO for his left shoulder    Baseline Assess next visit    Time 8   Period Weeks   Status On-going     PT LONG  TERM GOAL #2   Title Patient will perform shoulder gym activity without increased pain    Baseline perfroming the wrong activity    Time 8   Period Weeks   Status On-going     PT LONG TERM GOAL #3   Title Patient will run for 20 minutes without self reported increased SI and back pain    Baseline has not been able to begin running    Time 8   Period Weeks   Status On-going               Plan - 02/07/17 6073    Clinical Impression Statement Therapy took all the patients exercises that he has been given and condensed them into three programs. One for his back, one for his shoulders, and one if he wants to return to running. He seems to have been doing bits and pieces of the programs given to him. He was told to focus on his back.    Clinical Presentation Stable   Clinical Decision Making Low   Rehab Potential Good   PT Frequency 2x / week   PT Duration 8 weeks   PT Treatment/Interventions ADLs/Self Care Home Management;Cryotherapy;Electrical Stimulation;Iontophoresis '4mg'$ /ml Dexamethasone;Gait training;Moist Heat;Ultrasound;Patient/family education;Therapeutic exercise;Therapeutic activities;Manual techniques;Passive range of motion;Taping;Neuromuscular re-education   PT Next Visit Plan continue manual therapy , continue gym exercise   PT Home Exercise Plan hamstring stretch, bride with abduction, spine extension; pec stretch, lat pull down; row machine; standing flexion and scaption    Consulted and Agree with Plan of Care Patient      Patient will benefit from skilled therapeutic intervention in order to improve the following deficits and impairments:  Decreased activity tolerance, Decreased strength, Difficulty walking, Increased muscle spasms, Postural dysfunction, Impaired UE functional use  Visit Diagnosis: Chronic left shoulder pain  Acute right-sided low back pain without sciatica  Muscle weakness (generalized)     Problem List Patient Active Problem List    Diagnosis Date Noted  . Pain in shin 10/28/2016  . Left wrist pain 07/12/2016  . Lumbosacral radiculopathy at S1 03/15/2016  . Lumbar degenerative disc disease 12/18/2015  . Subacromial impingement of left shoulder 10/26/2015  . Left lumbar radiculitis 04/21/2015  . HNP (herniated nucleus pulposus), lumbar 04/21/2015  . Chondromalacia of both patellae 02/24/2015  .  Paranoid schizophrenia, chronic condition ([place]) 02/24/2015  . Chronic low back pain 07/18/2014  . Knee pain, acute 07/18/2014  . Right ankle pain 01/27/2014  . Pain in joint, ankle and foot 01/27/2014  . Sciatica 01/10/2012  . Thoracic or lumbosacral neuritis or radiculitis, unspecified 12/15/2011  . Ulnar neuropathy at elbow 11/14/2011  . Disturbance of skin sensation 11/14/2011    Carney Living PT DPT  02/07/2017, 8:47 AM  [facility] [address] [place], [name], [address] Phone: [phone]   Fax:  [phone]  Name: Glenn Robertson MRN: [id number] Date of Birth: 03-27-1966

## 2017-02-08 ENCOUNTER — Ambulatory Visit: Payer: Medicare Other | Admitting: Physical Therapy

## 2017-02-08 DIAGNOSIS — M6281 Muscle weakness (generalized): Secondary | ICD-10-CM | POA: Diagnosis not present

## 2017-02-08 DIAGNOSIS — M545 Low back pain, unspecified: Secondary | ICD-10-CM

## 2017-02-08 DIAGNOSIS — M25512 Pain in left shoulder: Secondary | ICD-10-CM | POA: Diagnosis not present

## 2017-02-08 DIAGNOSIS — G8929 Other chronic pain: Secondary | ICD-10-CM | POA: Diagnosis not present

## 2017-02-08 NOTE — Therapy (Signed)
[place], [name], [address] Phone: [phone]   Fax:  [phone]  Physical Therapy Treatment  Patient Details  Name: Glenn Robertson MRN: [id number] Date of Birth: [birth year] Referring Provider: Dr Lilia Argue   Encounter Date: 02/08/2017      PT End of Session - 02/08/17 0806    Visit Number 12   Number of Visits 16   Date for PT Re-Evaluation 02/08/17   Authorization Type Medicare    PT Start Time 0800   PT Stop Time 0842   PT Time Calculation (min) 42 min   Activity Tolerance Patient tolerated treatment well   Behavior During Therapy [facility] for tasks assessed/performed      Past Medical History:  Diagnosis Date  . Allergy   . Asthma   . Chronic pain   . Depression   . Diabetes mellitus   . Neuromuscular disorder [facility])     Past Surgical History:  Procedure Laterality Date  . Arm Surgery  1/12   rt ulnar nerve decompression  . EPIDURAL BLOCK INJECTION     several  . ULNAR NERVE TRANSPOSITION  01/19/2012   Procedure: ULNAR NERVE DECOMPRESSION/TRANSPOSITION;  Surgeon: Cammie Sickle., MD;  Location: [place];  Service: Orthopedics;  Laterality: Left;  ulnar nerve decompression vs transposition left elbow    There were no vitals filed for this visit.      Subjective Assessment - 02/08/17 0802    Subjective Patient reports his pain is about the same. He feels like he is having pain when he is standing up now. He belives it is not new he just did not realize it before. He was able to do a short amount of running yesterday. Heis having no pain today.    Limitations Standing;Walking   How long can you sit comfortably? No limit   How long can you stand comfortably? He has pain at times but it is inconsitent    How long can you walk comfortably? No limit walking, pain with running    Currently in Pain? No/denies   Pain Type Chronic pain   Pain Radiating Towards right  buttock    Pain Onset More than a month ago   Pain Frequency Intermittent   Aggravating Factors  jogging    Pain Relieving Factors rest, difficulty perfroming ADL's    Effect of Pain on Daily Activities difficulty perfroming ADL's             [facility] PT Assessment - 02/08/17 0001      Observation/Other Assessments   Focus on Therapeutic Outcomes (FOTO)  FOTO down      AROM   Overall AROM Comments No pain with active motion of the spine      Strength   Right Hip Flexion 5/5   Right Hip ABduction 5/5   Right Hip ADduction 5/5   Left Hip Flexion 5/5   Left Hip Extension 5/5   Left Hip ABduction 5/5   Left Hip ADduction 5/5     Palpation   Palpation comment continued spasming of the lower back      Transfers   Comments reports pain when transfering from sit to stand over the past few days.                      [facility] Adult PT Treatment/Exercise - 02/08/17 0001      Lumbar Exercises: Supine   Other Supine Lumbar Exercises bridge  x10; bridge on ball 2x10; bridge with abduction 21x10; double knee to chest 2x10;      Knee/Hip Exercises: Machines for Strengthening   Cybex Leg Press leg press 2x10 2 leg up / eccentric down 100lbs      Knee/Hip Exercises: Standing   Other Standing Knee Exercises cone drill 2x10; eccentric step down 2x10; lateral band walk 2x10 with green;      Knee/Hip Exercises: Supine   Bridges Limitations Bride x10 Bridge with green band abduction 2x10      Manual Therapy   Manual therapy comments PA mobilization to the scarum; Gentle right leg distraction; soft tissue mobilization to right lumbar paraspinals. Grade 5 Long axis thrust manuever 3x without cavitation; ER/IR MET without caviation 4x; LAD x 3 minutes; No increase in pain noted. PA and AP mobilization of the shoulder to decrease pain Grade 2 and 3 mobilizations                  PT Education - 02/08/17 0805    Education provided Yes   Education Details reviewed technique  with return to running exercises.    Person(s) Educated Patient   Methods Explanation;Demonstration;Tactile cues   Comprehension Verbalized understanding;Returned demonstration          PT Short Term Goals - 02/08/17 0825      PT SHORT TERM GOAL #1   Title Patient will demsotrate full pain free left shoulder ROM    Baseline no pain at end range today    Time 4   Period Weeks   Status Achieved     PT SHORT TERM GOAL #2   Title Patient will demsotrate 5/5 left shoulder and scapular strength    Baseline 5/5 flex, abdct, ER    Time 4   Period Weeks   Status Achieved     PT SHORT TERM GOAL #3   Title Patient will demonstrate 5/5 bilateral hip flexion    Baseline 5/5    Time 4   Period Weeks   Status Achieved     PT SHORT TERM GOAL #4   Title Patient will be independent with inital HEP for strengthening    Baseline streamlined HEP. Patient has been doing all of his exercises    Time 4   Period Weeks   Status Achieved           PT Long Term Goals - 02/08/17 3716      PT LONG TERM GOAL #1   Title Patient will demsotrate a 31% deficit on FOTO for his left shoulder    Baseline FOTO down    Period Weeks   Status On-going     PT LONG TERM GOAL #2   Title Patient will perform shoulder gym activity without increased pain    Baseline perfroming the wrong activity    Time 8   Period Weeks   Status On-going     PT LONG TERM GOAL #3   Title Patient will run for 20 minutes without self reported increased SI and back pain    Baseline has not been able to begin running    Time 8   Period Weeks   Status On-going               Plan - 02/08/17 0816    Clinical Impression Statement Patients shoulders have improved. His back improvements are questioable. He continues to have tenderness to aplapation. He is still functioning at a high level working out several times a week  but he has pain when he works out [employer] His strength Has improved in his hips. He has made  some porgress with manual therapy over the past few visits. Therapy will focus on manual therapy over the next 3-4 visits to see if anymore progress can be made. At that time he will likley be discharged.    Clinical Presentation Stable   Clinical Decision Making Low   Rehab Potential Good   PT Frequency 2x / week   PT Duration 8 weeks   PT Treatment/Interventions ADLs/Self Care Home Management;Cryotherapy;Electrical Stimulation;Iontophoresis '4mg'$ /ml Dexamethasone;Gait training;Moist Heat;Ultrasound;Patient/family education;Therapeutic exercise;Therapeutic activities;Manual techniques;Passive range of motion;Taping;Neuromuscular re-education   PT Next Visit Plan continue manual therapy , continue gym exercise   PT Home Exercise Plan hamstring stretch, bride with abduction, spine extension; pec stretch, lat pull down; row machine; standing flexion and scaption    Consulted and Agree with Plan of Care Patient      Patient will benefit from skilled therapeutic intervention in order to improve the following deficits and impairments:  Decreased activity tolerance, Decreased strength, Difficulty walking, Increased muscle spasms, Postural dysfunction, Impaired UE functional use  Visit Diagnosis: Chronic left shoulder pain  Acute right-sided low back pain without sciatica  Muscle weakness (generalized)  Muscle weakness       G-Codes - 2017-03-08 1011    Functional Assessment Tool Used (Outpatient Only) FOTO clinical decision making    Functional Limitation Mobility: Walking and moving around   Mobility: Walking and Moving Around Current Status ([id number]) At least 20 percent but less than 40 percent impaired, limited or restricted   Mobility: Walking and Moving Around Goal Status [phone]) At least 1 percent but less than 20 percent impaired, limited or restricted      Problem List Patient Active Problem List   Diagnosis Date Noted  . Pain in shin 10/28/2016  . Left wrist pain 07/12/2016  .  Lumbosacral radiculopathy at S1 03/15/2016  . Lumbar degenerative disc disease 12/18/2015  . Subacromial impingement of left shoulder 10/26/2015  . Left lumbar radiculitis 04/21/2015  . HNP (herniated nucleus pulposus), lumbar 04/21/2015  . Chondromalacia of both patellae 02/24/2015  . Paranoid schizophrenia, chronic condition ([place]) 02/24/2015  . Chronic low back pain 07/18/2014  . Knee pain, acute 07/18/2014  . Right ankle pain 01/27/2014  . Pain in joint, ankle and foot 01/27/2014  . Sciatica 01/10/2012  . Thoracic or lumbosacral neuritis or radiculitis, unspecified 12/15/2011  . Ulnar neuropathy at elbow 11/14/2011  . Disturbance of skin sensation 11/14/2011    Carney Living PT DPT  02/08/2017, 1:15 PM  [facility] [address] [place], [name], [address] Phone: [phone]   Fax:  [phone]  Name: Glenn Robertson MRN: [id number] Date of Birth: 1966/01/06

## 2017-02-09 DIAGNOSIS — H5213 Myopia, bilateral: Secondary | ICD-10-CM | POA: Diagnosis not present

## 2017-02-09 DIAGNOSIS — E119 Type 2 diabetes mellitus without complications: Secondary | ICD-10-CM | POA: Diagnosis not present

## 2017-02-09 DIAGNOSIS — H52223 Regular astigmatism, bilateral: Secondary | ICD-10-CM | POA: Diagnosis not present

## 2017-02-09 DIAGNOSIS — H43813 Vitreous degeneration, bilateral: Secondary | ICD-10-CM | POA: Diagnosis not present

## 2017-02-09 DIAGNOSIS — H40023 Open angle with borderline findings, high risk, bilateral: Secondary | ICD-10-CM | POA: Diagnosis not present

## 2017-02-09 DIAGNOSIS — H524 Presbyopia: Secondary | ICD-10-CM | POA: Diagnosis not present

## 2017-02-13 ENCOUNTER — Ambulatory Visit: Payer: Medicare Other | Admitting: Physical Therapy

## 2017-02-13 DIAGNOSIS — M545 Low back pain, unspecified: Secondary | ICD-10-CM

## 2017-02-13 DIAGNOSIS — G8929 Other chronic pain: Secondary | ICD-10-CM

## 2017-02-13 DIAGNOSIS — M6281 Muscle weakness (generalized): Secondary | ICD-10-CM | POA: Diagnosis not present

## 2017-02-13 DIAGNOSIS — M25512 Pain in left shoulder: Secondary | ICD-10-CM | POA: Diagnosis not present

## 2017-02-13 NOTE — Therapy (Signed)
[place], [name], [address] Phone: [phone]   Fax:  [phone]  Physical Therapy Treatment  Patient Details  Name: Glenn Robertson MRN: [id number] Date of Birth: 02-03-1966 Referring Provider: Dr Lilia Argue   Encounter Date: 02/13/2017      PT End of Session - 02/13/17 0800    Visit Number 13   Number of Visits 16   Date for PT Re-Evaluation 03/08/17   Authorization Type Medicare    PT Start Time 8115   PT Stop Time 0839   PT Time Calculation (min) 44 min   Activity Tolerance Patient tolerated treatment well   Behavior During Therapy [facility] for tasks assessed/performed      Past Medical History:  Diagnosis Date  . Allergy   . Asthma   . Chronic pain   . Depression   . Diabetes mellitus   . Neuromuscular disorder [facility])     Past Surgical History:  Procedure Laterality Date  . Arm Surgery  1/12   rt ulnar nerve decompression  . EPIDURAL BLOCK INJECTION     several  . ULNAR NERVE TRANSPOSITION  01/19/2012   Procedure: ULNAR NERVE DECOMPRESSION/TRANSPOSITION;  Surgeon: Cammie Sickle., MD;  Location: [place];  Service: Orthopedics;  Laterality: Left;  ulnar nerve decompression vs transposition left elbow    There were no vitals filed for this visit.      Subjective Assessment - 02/13/17 0758    Subjective Patient ran this morning and report pain on the right side of his back that radiated into his back. He is not having pain now that he stopped running.    Limitations Standing;Walking   How long can you sit comfortably? No limit   How long can you stand comfortably? He has pain at times but it is inconsitent    How long can you walk comfortably? No limit walking, pain with running    Currently in Pain? No/denies                         [facility] Adult PT Treatment/Exercise - 02/13/17 0001      Knee/Hip Exercises: Standing   Other Standing Knee  Exercises cone drill 2x10; eccentric step down 2x10 4 inch ; lateral band walk 2x10 with green; Standing d2 flexion 2x10 5lb kettle bell    Other Standing Knee Exercises heel raise 2x10      Manual Therapy   Manual therapy comments PA mobilization to the scarum; Gentle right leg distraction; soft tissue mobilization to right lumbar paraspinals. Grade 5 Long axis thrust manuever 3x without cavitation; ER/IR MET without caviation 4x; LAD x 3 minutes; No increase in pain noted. PA and AP mobilization of the shoulder to decrease pain Grade 2 and 3 mobilizations                  PT Education - 02/13/17 0759    Education provided Yes   Education Details xontinue to work on technique; reviewed return to running exercises.    Person(s) Educated Patient   Methods Explanation;Demonstration;Tactile cues   Comprehension Verbalized understanding;Returned demonstration          PT Short Term Goals - 02/13/17 0803      PT SHORT TERM GOAL #1   Title Patient will demsotrate full pain free left shoulder ROM    Baseline no pain at end range today    Time 4   Period  Weeks   Status Achieved     PT SHORT TERM GOAL #2   Title Patient will demsotrate 5/5 left shoulder and scapular strength    Baseline 5/5 flex, abdct, ER    Time 4   Period Weeks   Status Achieved     PT SHORT TERM GOAL #3   Title Patient will demonstrate 5/5 bilateral hip flexion    Baseline 5/5    Time 4   Period Weeks   Status Achieved     PT SHORT TERM GOAL #4   Title Patient will be independent with inital HEP for strengthening    Baseline streamlined HEP. Patient has been doing all of his exercises    Time 4   Period Weeks   Status Achieved           PT Long Term Goals - 02/08/17 5916      PT LONG TERM GOAL #1   Title Patient will demsotrate a 31% deficit on FOTO for his left shoulder    Baseline FOTO down    Period Weeks   Status On-going     PT LONG TERM GOAL #2   Title Patient will perform  shoulder gym activity without increased pain    Baseline perfroming the wrong activity    Time 8   Period Weeks   Status On-going     PT LONG TERM GOAL #3   Title Patient will run for 20 minutes without self reported increased SI and back pain    Baseline has not been able to begin running    Time 8   Period Weeks   Status On-going               Plan - 02/13/17 0801    Clinical Impression Statement Patients right single leg stability has improved. Unfourtunelty his pain continues when he runs and stands too long.. Therapy reviewed stretching and return to running exercises. His technique is improving.    Clinical Presentation Stable   Clinical Decision Making Low   Rehab Potential Good   PT Frequency 2x / week   PT Duration 8 weeks   PT Treatment/Interventions ADLs/Self Care Home Management;Cryotherapy;Electrical Stimulation;Iontophoresis 57m/ml Dexamethasone;Gait training;Moist Heat;Ultrasound;Patient/family education;Therapeutic exercise;Therapeutic activities;Manual techniques;Passive range of motion;Taping;Neuromuscular re-education   PT Next Visit Plan continue manual therapy , continue gym exercise   PT Home Exercise Plan hamstring stretch, bride with abduction, spine extension; pec stretch, lat pull down; row machine; standing flexion and scaption    Consulted and Agree with Plan of Care Patient      Patient will benefit from skilled therapeutic intervention in order to improve the following deficits and impairments:  Decreased activity tolerance, Decreased strength, Difficulty walking, Increased muscle spasms, Postural dysfunction, Impaired UE functional use  Visit Diagnosis: Chronic left shoulder pain  Acute right-sided low back pain without sciatica  Muscle weakness (generalized)  Muscle weakness     Problem List Patient Active Problem List   Diagnosis Date Noted  . Pain in shin 10/28/2016  . Left wrist pain 07/12/2016  . Lumbosacral radiculopathy at  S1 03/15/2016  . Lumbar degenerative disc disease 12/18/2015  . Subacromial impingement of left shoulder 10/26/2015  . Left lumbar radiculitis 04/21/2015  . HNP (herniated nucleus pulposus), lumbar 04/21/2015  . Chondromalacia of both patellae 02/24/2015  . [place] 02/24/2015  . Chronic low back pain 07/18/2014  . Knee pain, acute 07/18/2014  . Right ankle pain 01/27/2014  . Pain in joint, ankle and  foot 01/27/2014  . Sciatica 01/10/2012  . Thoracic or lumbosacral neuritis or radiculitis, unspecified 12/15/2011  . Ulnar neuropathy at elbow 11/14/2011  . Disturbance of skin sensation 11/14/2011    Carney Living PT DPT  02/13/2017, 9:12 AM  [facility] [address] [place], [name], [address] Phone: [phone]   Fax:  [phone]  Name: Kha Hari MRN: [id number] Date of Birth: 1966-01-18

## 2017-02-15 ENCOUNTER — Encounter: Payer: Self-pay | Admitting: Physical Therapy

## 2017-02-15 ENCOUNTER — Ambulatory Visit: Payer: Medicare Other | Admitting: Physical Therapy

## 2017-02-15 DIAGNOSIS — M25512 Pain in left shoulder: Principal | ICD-10-CM

## 2017-02-15 DIAGNOSIS — M6281 Muscle weakness (generalized): Secondary | ICD-10-CM

## 2017-02-15 DIAGNOSIS — G8929 Other chronic pain: Secondary | ICD-10-CM

## 2017-02-15 DIAGNOSIS — M545 Low back pain, unspecified: Secondary | ICD-10-CM

## 2017-02-15 NOTE — Therapy (Signed)
[place], [name], [address] Phone: [phone]   Fax:  [phone]  Physical Therapy Treatment  Patient Details  Name: Glenn Robertson MRN: [id number] Date of Birth: Nov 03, 1965 Referring Provider: Dr Lilia Argue   Encounter Date: 02/15/2017      PT End of Session - 02/15/17 0805    Visit Number 14   Number of Visits 16   Date for PT Re-Evaluation 03/08/17   Authorization Type Medicare    PT Start Time 0800   PT Stop Time 0840   PT Time Calculation (min) 40 min   Activity Tolerance Patient tolerated treatment well   Behavior During Therapy [facility] for tasks assessed/performed      Past Medical History:  Diagnosis Date  . Allergy   . Asthma   . Chronic pain   . Depression   . Diabetes mellitus   . Neuromuscular disorder [facility])     Past Surgical History:  Procedure Laterality Date  . Arm Surgery  1/12   rt ulnar nerve decompression  . EPIDURAL BLOCK INJECTION     several  . ULNAR NERVE TRANSPOSITION  01/19/2012   Procedure: ULNAR NERVE DECOMPRESSION/TRANSPOSITION;  Surgeon: Cammie Sickle., MD;  Location: [employer] [facility];  Service: Orthopedics;  Laterality: Left;  ulnar nerve decompression vs transposition left elbow    There were no vitals filed for this visit.      Subjective Assessment - 02/15/17 0803    Subjective Patient reports some pain in his back when he was running from the car to the door this morning and had pain He an yesterday and had pain in the bottom of his foot butdid not report pain in his back.    Limitations Standing;Walking   How long can you sit comfortably? No limit   How long can you stand comfortably? He has pain at times but it is inconsitent    How long can you walk comfortably? No limit walking, pain with running    Currently in Pain? Yes   Pain Score 4   only when running    Pain Location Back   Pain Orientation Right   Pain Descriptors /  Indicators Aching   Pain Type Chronic pain   Pain Radiating Towards right buttcok    Pain Onset More than a month ago   Pain Frequency Intermittent   Aggravating Factors  jogging    Pain Relieving Factors rest, difficulty perfroming ADL's    Multiple Pain Sites No                         [facility] Adult PT Treatment/Exercise - 02/15/17 0001      Lumbar Exercises: Stretches   Active Hamstring Stretch Limitations with strap 2x30sec    Quad Stretch Limitations thomas stretch 3x20 sec    Piriformis Stretch 2 reps;30 seconds     Lumbar Exercises: Supine   Other Supine Lumbar Exercises bridge x10; bridge on ball 2x10; bridge with abduction 21x10; double knee to chest 2x10;      Lumbar Exercises: Prone   Opposite Arm/Leg Raise Limitations 2x10    Other Prone Lumbar Exercises plank x60 sec; Prayed on ball 3x15 sec      Shoulder Exercises: Seated   Other Seated Exercises row machine with cuing for stomach contraction x20 each handle; reviewed lat pulldowns for the gym.  PT Education - 02/15/17 0805    Education provided Yes   Education Details rationale behid manual therapy. Rationale behind core strengthening    Person(s) Educated Patient   Methods Explanation;Demonstration;Tactile cues   Comprehension Verbalized understanding;Returned demonstration          PT Short Term Goals - 02/13/17 0803      PT SHORT TERM GOAL #1   Title Patient will demsotrate full pain free left shoulder ROM    Baseline no pain at end range today    Time 4   Period Weeks   Status Achieved     PT SHORT TERM GOAL #2   Title Patient will demsotrate 5/5 left shoulder and scapular strength    Baseline 5/5 flex, abdct, ER    Time 4   Period Weeks   Status Achieved     PT SHORT TERM GOAL #3   Title Patient will demonstrate 5/5 bilateral hip flexion    Baseline 5/5    Time 4   Period Weeks   Status Achieved     PT SHORT TERM GOAL #4   Title Patient will be  independent with inital HEP for strengthening    Baseline streamlined HEP. Patient has been doing all of his exercises    Time 4   Period Weeks   Status Achieved           PT Long Term Goals - 02/08/17 1779      PT LONG TERM GOAL #1   Title Patient will demsotrate a 31% deficit on FOTO for his left shoulder    Baseline FOTO down    Period Weeks   Status On-going     PT LONG TERM GOAL #2   Title Patient will perform shoulder gym activity without increased pain    Baseline perfroming the wrong activity    Time 8   Period Weeks   Status On-going     PT LONG TERM GOAL #3   Title Patient will run for 20 minutes without self reported increased SI and back pain    Baseline has not been able to begin running    Time 8   Period Weeks   Status On-going               Plan - 02/15/17 3903    Clinical Impression Statement Patient reported just minor soreness with high level core exercises. Therapy continues to work on [employer] therapy. He reports he has been doing a weighted porne hip extension machineat the gym which may be causing some pain. He was again advised if it hurts ndont do it. He will likely discharge necxt week 2nd to lack of progress. Therapy will continue to work on high level core strengthening.    Clinical Presentation Stable   Clinical Decision Making Low   Rehab Potential Good   PT Frequency 2x / week   PT Duration 8 weeks   PT Treatment/Interventions ADLs/Self Care Home Management;Cryotherapy;Electrical Stimulation;Iontophoresis 4mg /ml Dexamethasone;Gait training;Moist Heat;Ultrasound;Patient/family education;Therapeutic exercise;Therapeutic activities;Manual techniques;Passive range of motion;Taping;Neuromuscular re-education   PT Next Visit Plan continue manual therapy , continue gym exercise   PT Home Exercise Plan hamstring stretch, bride with abduction, spine extension; pec stretch, lat pull down; row machine; standing flexion and scaption    Consulted  and Agree with Plan of Care Patient      Patient will benefit from skilled therapeutic intervention in order to improve the following deficits and impairments:  Decreased activity tolerance, Decreased strength, Difficulty walking, Increased  muscle spasms, Postural dysfunction, Impaired UE functional use  Visit Diagnosis: Chronic left shoulder pain  Acute right-sided low back pain without sciatica  Muscle weakness (generalized)  Muscle weakness     Problem List Patient Active Problem List   Diagnosis Date Noted  . Pain in shin 10/28/2016  . Left wrist pain 07/12/2016  . Lumbosacral radiculopathy at S1 03/15/2016  . Lumbar degenerative disc disease 12/18/2015  . Subacromial impingement of left shoulder 10/26/2015  . Left lumbar radiculitis 04/21/2015  . HNP (herniated nucleus pulposus), lumbar 04/21/2015  . Chondromalacia of both patellae 02/24/2015  . Paranoid schizophrenia, chronic condition ([place]) 02/24/2015  . Chronic low back pain 07/18/2014  . Knee pain, acute 07/18/2014  . Right ankle pain 01/27/2014  . Pain in joint, ankle and foot 01/27/2014  . Sciatica 01/10/2012  . Thoracic or lumbosacral neuritis or radiculitis, unspecified 12/15/2011  . Ulnar neuropathy at elbow 11/14/2011  . Disturbance of skin sensation 11/14/2011    Carney Living PT DPT  02/15/2017, 2:14 PM  [facility] [address] [place], [name], [address] Phone: [phone]   Fax:  [phone]  Name: Glenn Robertson MRN: [id number] Date of Birth: 10-19-1965

## 2017-02-16 ENCOUNTER — Ambulatory Visit
Admission: RE | Admit: 2017-02-16 | Discharge: 2017-02-16 | Disposition: A | Discharge status: Home / Self Care | Payer: Medicare Other | Source: Ambulatory Visit | Attending: Specialist | Admitting: Specialist

## 2017-02-16 DIAGNOSIS — M5126 Other intervertebral disc displacement, lumbar region: Secondary | ICD-10-CM | POA: Diagnosis not present

## 2017-02-16 DIAGNOSIS — M5417 Radiculopathy, lumbosacral region: Secondary | ICD-10-CM

## 2017-02-20 ENCOUNTER — Encounter: Payer: Self-pay | Admitting: Physical Therapy

## 2017-02-20 ENCOUNTER — Ambulatory Visit: Payer: Medicare Other | Admitting: Physical Therapy

## 2017-02-20 DIAGNOSIS — M545 Low back pain, unspecified: Secondary | ICD-10-CM

## 2017-02-20 DIAGNOSIS — M6281 Muscle weakness (generalized): Secondary | ICD-10-CM

## 2017-02-20 DIAGNOSIS — G8929 Other chronic pain: Secondary | ICD-10-CM

## 2017-02-20 DIAGNOSIS — M25512 Pain in left shoulder: Secondary | ICD-10-CM | POA: Diagnosis not present

## 2017-02-20 NOTE — Therapy (Signed)
[place], [name], [address] Phone: [phone]   Fax:  [phone]  Physical Therapy Treatment  Patient Details  Name: Glenn Robertson MRN: [id number] Date of Birth: 11/19/1965 Referring Provider: Dr Lilia Argue   Encounter Date: 02/20/2017      PT End of Session - 02/20/17 0809    Visit Number 15   Number of Visits 16   Date for PT Re-Evaluation 03/08/17   Authorization Type Medicare    PT Start Time 0800   PT Stop Time 0841   PT Time Calculation (min) 41 min   Activity Tolerance Patient tolerated treatment well   Behavior During Therapy [facility] for tasks assessed/performed      Past Medical History:  Diagnosis Date  . Allergy   . Asthma   . Chronic pain   . Depression   . Diabetes mellitus   . Neuromuscular disorder [facility])     Past Surgical History:  Procedure Laterality Date  . Arm Surgery  1/12   rt ulnar nerve decompression  . EPIDURAL BLOCK INJECTION     several  . ULNAR NERVE TRANSPOSITION  01/19/2012   Procedure: ULNAR NERVE DECOMPRESSION/TRANSPOSITION;  Surgeon: Cammie Sickle., MD;  Location: [facility];  Service: Orthopedics;  Laterality: Left;  ulnar nerve decompression vs transposition left elbow    There were no vitals filed for this visit.      Subjective Assessment - 02/20/17 0805    Subjective Patient reports hi sciatica has come back. Sicne Thurday he has had pain down into his right buttock. He has not been doing his walking. He has been lifting weights.    Limitations Standing;Walking   How long can you sit comfortably? No limit   How long can you stand comfortably? He has pain at times but it is inconsitent    How long can you walk comfortably? No limit walking, pain with running    Currently in Pain? Yes   Pain Score 7    Pain Location Back   Pain Orientation Right   Pain Descriptors / Indicators Aching   Pain Type Chronic pain   Pain Radiating  Towards right buttock    Pain Onset More than a month ago   Pain Frequency Intermittent   Aggravating Factors  jogging    Pain Relieving Factors standing and walking    Effect of Pain on Daily Activities performing ADL's                          [facility] Adult PT Treatment/Exercise - 02/20/17 0001      Lumbar Exercises: Stretches   Active Hamstring Stretch Limitations with strap 2x30sec    Quad Stretch Limitations thomas stretch 3x20 sec    Piriformis Stretch 2 reps;30 seconds     Lumbar Exercises: Supine   Other Supine Lumbar Exercises SUpine march 2x10;  supine march 2x10    Other Supine Lumbar Exercises bridge x10; bridge on ball 2x10; bridge with abduction 21x10; double knee to chest 2x10;      Shoulder Exercises: Seated   Other Seated Exercises row machine with cuing for stomach contraction x20 each handle; reviewed lat pulldowns for the gym.      Manual Therapy   Manual therapy comments PA mobilization to the scarum; Gentle right leg distraction; soft tissue mobilization to right lumbar paraspinals. Grade 5 Long axis thrust manuever 3x without cavitation; ER/IR MET without caviation 4x; LAD  x 3 minutes; No increase in pain noted. PA and AP mobilization of the shoulder to decrease pain Grade 2 and 3 mobilizations                  PT Education - 02/20/17 0808    Education provided Yes   Education Details continue stretching and strengthening    Person(s) Educated Patient   Methods Explanation;Demonstration;Tactile cues   Comprehension Returned demonstration;Verbalized understanding          PT Short Term Goals - 02/13/17 0803      PT SHORT TERM GOAL #1   Title Patient will demsotrate full pain free left shoulder ROM    Baseline no pain at end range today    Time 4   Period Weeks   Status Achieved     PT SHORT TERM GOAL #2   Title Patient will demsotrate 5/5 left shoulder and scapular strength    Baseline 5/5 flex, abdct, ER    Time 4    Period Weeks   Status Achieved     PT SHORT TERM GOAL #3   Title Patient will demonstrate 5/5 bilateral hip flexion    Baseline 5/5    Time 4   Period Weeks   Status Achieved     PT SHORT TERM GOAL #4   Title Patient will be independent with inital HEP for strengthening    Baseline streamlined HEP. Patient has been doing all of his exercises    Time 4   Period Weeks   Status Achieved           PT Long Term Goals - 02/08/17 5093      PT LONG TERM GOAL #1   Title Patient will demsotrate a 31% deficit on FOTO for his left shoulder    Baseline FOTO down    Period Weeks   Status On-going     PT LONG TERM GOAL #2   Title Patient will perform shoulder gym activity without increased pain    Baseline perfroming the wrong activity    Time 8   Period Weeks   Status On-going     PT LONG TERM GOAL #3   Title Patient will run for 20 minutes without self reported increased SI and back pain    Baseline has not been able to begin running    Time 8   Period Weeks   Status On-going               Plan - 02/20/17 0809    Clinical Impression Statement At this time the patient has not made much progress. Therapy will perfrom a review all exercises next visit and likley discharge to HEP. He has been encouraged to continue with exercises. He continues to require cuing for technique with ther-ex and stretching.    Clinical Presentation Stable   Clinical Decision Making Low   Rehab Potential Good   PT Frequency 2x / week   PT Duration 8 weeks   PT Treatment/Interventions ADLs/Self Care Home Management;Cryotherapy;Electrical Stimulation;Iontophoresis '4mg'$ /ml Dexamethasone;Gait training;Moist Heat;Ultrasound;Patient/family education;Therapeutic exercise;Therapeutic activities;Manual techniques;Passive range of motion;Taping;Neuromuscular re-education   PT Next Visit Plan continue manual therapy , continue gym exercise      Patient will benefit from skilled therapeutic intervention  in order to improve the following deficits and impairments:  Decreased activity tolerance, Decreased strength, Difficulty walking, Increased muscle spasms, Postural dysfunction, Impaired UE functional use  Visit Diagnosis: Chronic left shoulder pain  Acute right-sided low back pain without sciatica  Muscle weakness (generalized)  Muscle weakness     Problem List Patient Active Problem List   Diagnosis Date Noted  . Pain in shin 10/28/2016  . Left wrist pain 07/12/2016  . Lumbosacral radiculopathy at S1 03/15/2016  . Lumbar degenerative disc disease 12/18/2015  . Subacromial impingement of left shoulder 10/26/2015  . Left lumbar radiculitis 04/21/2015  . HNP (herniated nucleus pulposus), lumbar 04/21/2015  . Chondromalacia of both patellae 02/24/2015  . Paranoid schizophrenia, chronic condition ([place]) 02/24/2015  . Chronic low back pain 07/18/2014  . Knee pain, acute 07/18/2014  . Right ankle pain 01/27/2014  . Pain in joint, ankle and foot 01/27/2014  . Sciatica 01/10/2012  . Thoracic or lumbosacral neuritis or radiculitis, unspecified 12/15/2011  . Ulnar neuropathy at elbow 11/14/2011  . Disturbance of skin sensation 11/14/2011    Carney Living PT DPT  02/20/2017, 1:35 PM  [facility] [address] [place], [name], [address] Phone: [phone]   Fax:  [phone]  Name: Glenn Robertson MRN: [id number] Date of Birth: [birth year]

## 2017-02-22 ENCOUNTER — Ambulatory Visit: Payer: Medicare Other | Admitting: Physical Therapy

## 2017-02-22 DIAGNOSIS — M545 Low back pain, unspecified: Secondary | ICD-10-CM

## 2017-02-22 DIAGNOSIS — G8929 Other chronic pain: Secondary | ICD-10-CM

## 2017-02-22 DIAGNOSIS — M25512 Pain in left shoulder: Secondary | ICD-10-CM | POA: Diagnosis not present

## 2017-02-22 DIAGNOSIS — M6281 Muscle weakness (generalized): Secondary | ICD-10-CM | POA: Diagnosis not present

## 2017-02-22 NOTE — Therapy (Signed)
[place], [name], [address] Phone: [phone]   Fax:  [phone]  Physical Therapy Treatment/ Discharge  Patient Details  Name: Glenn Robertson MRN: [id number] Date of Birth: 1966/08/28 Referring Provider: Dr Lilia Argue   Encounter Date: 02/22/2017      PT End of Session - 02/22/17 0810    Visit Number 16   Number of Visits 16   Date for PT Re-Evaluation 03/08/17   Authorization Type Medicare    PT Start Time 0758   PT Stop Time 0842   PT Time Calculation (min) 44 min   Activity Tolerance Patient tolerated treatment well   Behavior During Therapy [facility] for tasks assessed/performed      Past Medical History:  Diagnosis Date  . Allergy   . Asthma   . Chronic pain   . Depression   . Diabetes mellitus   . Neuromuscular disorder [facility])     Past Surgical History:  Procedure Laterality Date  . Arm Surgery  1/12   rt ulnar nerve decompression  . EPIDURAL BLOCK INJECTION     several  . ULNAR NERVE TRANSPOSITION  01/19/2012   Procedure: ULNAR NERVE DECOMPRESSION/TRANSPOSITION;  Surgeon: Cammie Sickle., MD;  Location: [place];  Service: Orthopedics;  Laterality: Left;  ulnar nerve decompression vs transposition left elbow    There were no vitals filed for this visit.      Subjective Assessment - 02/22/17 0806    Subjective Patient reports pain into his right thigh. He feels like it has been hurting him more the past few days. He appears to have plateaued in progress. perfrom discharge today.    Limitations Standing;Walking   How long can you sit comfortably? No limit   How long can you stand comfortably? He has pain at times but it is inconsitent    How long can you walk comfortably? No limit walking, pain with running    Currently in Pain? Yes   Pain Score 7    Pain Location Back   Pain Orientation Right   Pain Descriptors / Indicators Aching   Pain Type Chronic pain    Pain Radiating Towards right buttock    Pain Onset More than a month ago   Pain Frequency Intermittent   Aggravating Factors  jogging    Pain Relieving Factors standing and walking    Effect of Pain on Daily Activities perfroming ADL's                          [facility] Adult PT Treatment/Exercise - 02/22/17 0001      Lumbar Exercises: Stretches   Active Hamstring Stretch Limitations with strap 2x30sec    Quad Stretch Limitations thomas stretch 3x20 sec    Piriformis Stretch 2 reps;30 seconds     Lumbar Exercises: Supine   Other Supine Lumbar Exercises SUpine march 2x10;  supine march 2x10    Other Supine Lumbar Exercises bridge x10; bridge on ball 2x10; bridge with abduction 21x10; double knee to chest 2x10;      Manual Therapy   Manual therapy comments PA mobilization to the scarum; Gentle right leg distraction; soft tissue mobilization to right lumbar paraspinals. Grade 5 Long axis thrust manuever 3x without cavitation; ER/IR MET without caviation 4x; LAD x 3 minutes; No increase in pain noted. PA and AP mobilization of the shoulder to decrease pain Grade 2 and 3 mobilizations  PT Education - 02/22/17 9449    Education provided Yes   Education Details reviewed HEP; Continue with stretching and strengthening    Person(s) Educated Patient   Methods Explanation;Demonstration;Tactile cues   Comprehension Verbalized understanding;Returned demonstration          PT Short Term Goals - 02/22/17 1620      PT SHORT TERM GOAL #1   Title Patient will demsotrate full pain free left shoulder ROM    Baseline no pain at end range    Time 4   Period Weeks   Status Achieved     PT SHORT TERM GOAL #2   Title Patient will demsotrate 5/5 left shoulder and scapular strength    Baseline 5/5 flex, abdct, ER    Time 4   Status Achieved     PT SHORT TERM GOAL #3   Title Patient will demonstrate 5/5 bilateral hip flexion    Baseline 5/5    Time 4    Period Weeks   Status Achieved     PT SHORT TERM GOAL #4   Title Patient will be independent with inital HEP for strengthening    Baseline streamlined HEP. Patient has been doing all of his exercises    Time 4   Period Weeks   Status Achieved           PT Long Term Goals - 02/22/17 1621      PT LONG TERM GOAL #1   Title Patient will demsotrate a 31% deficit on FOTO for his left shoulder    Baseline 29%    Time 8   Period Weeks   Status Achieved     PT LONG TERM GOAL #2   Title Patient will perform shoulder gym activity without increased pain    Baseline performing    Time 8   Period Weeks   Status Achieved     PT LONG TERM GOAL #3   Title Patient will run for 20 minutes without self reported increased SI and back pain    Baseline has not been able to run    Time 8   Period Weeks   Status Not Met               Plan - 02/22/17 6759    Clinical Impression Statement At this time the patient has plateud in progress. Since he began running he has had increased symptoms into his right leg. He has a comlete HEP. He was encouraged to continue with his stretches and exercises. Per MRI his nerve compression has improved. He was also ecnouraged to do activity that dosent hurt him too much. D/C to HEP.    Clinical Presentation Stable   Clinical Decision Making Low   Rehab Potential Good   PT Frequency 2x / week   PT Duration 8 weeks   PT Treatment/Interventions ADLs/Self Care Home Management;Cryotherapy;Electrical Stimulation;Iontophoresis '4mg'$ /ml Dexamethasone;Gait training;Moist Heat;Ultrasound;Patient/family education;Therapeutic exercise;Therapeutic activities;Manual techniques;Passive range of motion;Taping;Neuromuscular re-education   PT Next Visit Plan continue manual therapy , continue gym exercise   PT Home Exercise Plan hamstring stretch, bride with abduction, spine extension; pec stretch, lat pull down; row machine; standing flexion and scaption    Consulted  and Agree with Plan of Care Patient      Patient will benefit from skilled therapeutic intervention in order to improve the following deficits and impairments:  Decreased activity tolerance, Decreased strength, Difficulty walking, Increased muscle spasms, Postural dysfunction, Impaired UE functional use  Visit Diagnosis: Chronic left shoulder  pain  Acute right-sided low back pain without sciatica  Muscle weakness (generalized)  Muscle weakness       G-Codes - 2017/03/05 1622    Functional Assessment Tool Used (Outpatient Only) FOTO clinical decision making    Functional Limitation Mobility: Walking and moving around   Mobility: Walking and Moving Around Current Status [phone]) At least 20 percent but less than 40 percent impaired, limited or restricted   Mobility: Walking and Moving Around Goal Status [phone]) At least 20 percent but less than 40 percent impaired, limited or restricted   Mobility: Walking and Moving Around Discharge Status [phone]) At least 20 percent but less than 40 percent impaired, limited or restricted      Problem List Patient Active Problem List   Diagnosis Date Noted  . Pain in shin 10/28/2016  . Left wrist pain 07/12/2016  . Lumbosacral radiculopathy at S1 03/15/2016  . Lumbar degenerative disc disease 12/18/2015  . Subacromial impingement of left shoulder 10/26/2015  . Left lumbar radiculitis 04/21/2015  . HNP (herniated nucleus pulposus), lumbar 04/21/2015  . Chondromalacia of both patellae 02/24/2015  . Paranoid schizophrenia, chronic condition ([place]) 02/24/2015  . Chronic low back pain 07/18/2014  . Knee pain, acute 07/18/2014  . Right ankle pain 01/27/2014  . Pain in joint, ankle and foot 01/27/2014  . Sciatica 01/10/2012  . Thoracic or lumbosacral neuritis or radiculitis, unspecified 12/15/2011  . Ulnar neuropathy at elbow 11/14/2011  . Disturbance of skin sensation 11/14/2011   PHYSICAL THERAPY DISCHARGE SUMMARY  Visits from Start of Care:  16  Current functional level related to goals / functional outcomes: Continues to have low back pain. Has had radicular pain since he started running    Remaining deficits: See above    Education / Equipment: Has HEP to work on  Plan: Patient agrees to discharge.  Patient goals were not met. Patient is being discharged due to lack of progress.  ?????      Carney Living  PT DPT  [facility] [address] [place], [name], [address] Phone: [phone]   Fax:  [phone]  Name: Glenn Robertson MRN: [id number] Date of Birth: [birth year]

## 2017-02-28 ENCOUNTER — Ambulatory Visit: Payer: Medicare Other | Admitting: Sports Medicine

## 2017-03-07 ENCOUNTER — Ambulatory Visit (INDEPENDENT_AMBULATORY_CARE_PROVIDER_SITE_OTHER): Payer: Medicare Other | Admitting: Sports Medicine

## 2017-03-07 ENCOUNTER — Ambulatory Visit: Payer: Medicare Other | Admitting: Sports Medicine

## 2017-03-07 VITALS — Ht 70.0 in | Wt 202.0 lb

## 2017-03-07 DIAGNOSIS — M25571 Pain in right ankle and joints of right foot: Secondary | ICD-10-CM

## 2017-03-07 DIAGNOSIS — M25551 Pain in right hip: Secondary | ICD-10-CM | POA: Diagnosis not present

## 2017-03-07 DIAGNOSIS — M7741 Metatarsalgia, right foot: Secondary | ICD-10-CM

## 2017-03-07 NOTE — Progress Notes (Signed)
   Subjective:    Patient ID: Glenn Robertson, male    DOB: 1966-08-06, 51 y.o.   MRN: [id number]  HPI   Patient comes in today to discuss MRI findings of his lumbar spine. There is no evidence of right-sided radiculopathy. He still has a left-sided disc extrusion at L5-S1 but he denies symptoms down the left leg. Main pain centers around the paraspinal musculature of the L5-S1 area. Pain will occasionally radiate down the posterior aspect of his leg to his knee. Symptoms are worse with prolonged sitting or with running. He denies numbness or tingling in the leg. He has had 6 weeks of physical therapy and has been discharged to a home exercise program.    Review of Systems    as above Objective:   Physical Exam  Well-developed, sitting comfortable in exam room  Lumbar spine: Good lumbar range of motion. There is a palpable trigger point just to the right of the L5 vertebral body. No gross neurological deficits of either lower extremity.      Assessment & Plan:   Mechanical low back pain  Patient has MRI evidence of a left-sided L5-S1 disc extrusion but his left-sided sciatica has resolved. I think his right-sided low back pain is mechanical in nature. I've encouraged him to continue with his home exercises and I recommend that he avoid impact exercises such as running for the next 2-3 months. He is also complaining of some metatarsalgia in his right foot. I will give him a small metatarsal pad to wear in his shoe. Follow-up as needed.

## 2017-03-09 DIAGNOSIS — R5381 Other malaise: Secondary | ICD-10-CM | POA: Diagnosis not present

## 2017-03-09 DIAGNOSIS — Z0000 Encounter for general adult medical examination without abnormal findings: Secondary | ICD-10-CM | POA: Diagnosis not present

## 2017-03-09 DIAGNOSIS — E785 Hyperlipidemia, unspecified: Secondary | ICD-10-CM | POA: Diagnosis not present

## 2017-03-09 DIAGNOSIS — E119 Type 2 diabetes mellitus without complications: Secondary | ICD-10-CM | POA: Diagnosis not present

## 2017-03-09 DIAGNOSIS — Z125 Encounter for screening for malignant neoplasm of prostate: Secondary | ICD-10-CM | POA: Diagnosis not present

## 2017-03-15 DIAGNOSIS — M5417 Radiculopathy, lumbosacral region: Secondary | ICD-10-CM | POA: Diagnosis not present

## 2017-03-15 DIAGNOSIS — G3184 Mild cognitive impairment, so stated: Secondary | ICD-10-CM | POA: Diagnosis not present

## 2017-03-15 DIAGNOSIS — G603 Idiopathic progressive neuropathy: Secondary | ICD-10-CM | POA: Diagnosis not present

## 2017-03-15 DIAGNOSIS — M2550 Pain in unspecified joint: Secondary | ICD-10-CM | POA: Diagnosis not present

## 2017-03-20 DIAGNOSIS — F329 Major depressive disorder, single episode, unspecified: Secondary | ICD-10-CM | POA: Diagnosis not present

## 2017-03-23 DIAGNOSIS — Z23 Encounter for immunization: Secondary | ICD-10-CM | POA: Diagnosis not present

## 2017-04-04 DIAGNOSIS — M5126 Other intervertebral disc displacement, lumbar region: Secondary | ICD-10-CM | POA: Diagnosis not present

## 2017-04-14 DIAGNOSIS — Z79899 Other long term (current) drug therapy: Secondary | ICD-10-CM | POA: Diagnosis not present

## 2017-04-14 DIAGNOSIS — F251 Schizoaffective disorder, depressive type: Secondary | ICD-10-CM | POA: Diagnosis not present

## 2017-04-19 ENCOUNTER — Ambulatory Visit (INDEPENDENT_AMBULATORY_CARE_PROVIDER_SITE_OTHER): Payer: Medicare Other

## 2017-04-19 ENCOUNTER — Encounter: Payer: Self-pay | Admitting: Podiatry

## 2017-04-19 ENCOUNTER — Ambulatory Visit (INDEPENDENT_AMBULATORY_CARE_PROVIDER_SITE_OTHER): Payer: Medicare Other | Admitting: Podiatry

## 2017-04-19 VITALS — BP 100/64 | HR 89 | Temp 97.9°F | Resp 16

## 2017-04-19 DIAGNOSIS — M722 Plantar fascial fibromatosis: Secondary | ICD-10-CM

## 2017-04-19 DIAGNOSIS — M7741 Metatarsalgia, right foot: Secondary | ICD-10-CM

## 2017-04-19 DIAGNOSIS — M79672 Pain in left foot: Secondary | ICD-10-CM

## 2017-04-19 DIAGNOSIS — M79671 Pain in right foot: Secondary | ICD-10-CM | POA: Insufficient documentation

## 2017-04-19 NOTE — Progress Notes (Signed)
   Subjective:    Patient ID: Glenn Robertson, male    DOB: 22-Apr-1966, 51 y.o.   MRN: [id number]  HPI: He presents today with a chief complaint of pain to the right lateral aspect of the forefoot 1 year. He states that he is developed a burning pain on the plantar surface underneath the fourth and fifth metatarsal heads. He states that it's a dull ache sometimes a searing burning pain. He states that he has had injections in the past and change shoes with very little relief. He has also had over-the-counter orthotics with met pads. He states that the pain is intermittent and will come and go with his exercise regimen but does relate a history of back disease sciatica to the left side and a poor sacroiliac joint on the right side with hamstring involvement.  Review of Systems  Musculoskeletal: Positive for back pain.  Neurological: Positive for numbness.       Objective:   Physical Exam: Vital signs are stable alert and oriented 3. Pulses are palpable. Neurologic sensorium is intact. Deep tendon reflexes are intact. Muscle strength +5 over 5 dorsiflexion plantar flexors and inverters everters all intrinsic musculature is intact. Orthopedic evaluation of the [facility] all joints distal to the ankle for range of motion without crepitation. He does have some compensated forefoot valgus with the heel in neutral. Radius taken today do not demonstrate any type of osseus abnormalities.        Assessment & Plan:  Assessment: Compensated varus deformity. Metatarsalgia fourth and fifth metatarsals right foot cannot rule out sciatic involvement.  Plan: He was scanned for set of new orthotics today and we discussed appropriate shoe gear. Follow up with him as needed.

## 2017-04-28 DIAGNOSIS — M461 Sacroiliitis, not elsewhere classified: Secondary | ICD-10-CM | POA: Diagnosis not present

## 2017-04-28 DIAGNOSIS — M5126 Other intervertebral disc displacement, lumbar region: Secondary | ICD-10-CM | POA: Diagnosis not present

## 2017-04-28 DIAGNOSIS — M5416 Radiculopathy, lumbar region: Secondary | ICD-10-CM | POA: Diagnosis not present

## 2017-05-10 ENCOUNTER — Encounter: Payer: Medicare Other | Admitting: Orthotics

## 2017-05-10 DIAGNOSIS — M5416 Radiculopathy, lumbar region: Secondary | ICD-10-CM | POA: Diagnosis not present

## 2017-05-10 DIAGNOSIS — Z6829 Body mass index (BMI) 29.0-29.9, adult: Secondary | ICD-10-CM | POA: Diagnosis not present

## 2017-05-17 ENCOUNTER — Encounter: Payer: Medicare Other | Admitting: Orthotics

## 2017-05-19 DIAGNOSIS — F251 Schizoaffective disorder, depressive type: Secondary | ICD-10-CM | POA: Diagnosis not present

## 2017-05-19 DIAGNOSIS — Z23 Encounter for immunization: Secondary | ICD-10-CM | POA: Diagnosis not present

## 2017-05-19 DIAGNOSIS — Z79899 Other long term (current) drug therapy: Secondary | ICD-10-CM | POA: Diagnosis not present

## 2017-05-19 DIAGNOSIS — F250 Schizoaffective disorder, bipolar type: Secondary | ICD-10-CM | POA: Diagnosis not present

## 2017-05-24 ENCOUNTER — Encounter: Payer: Medicare Other | Admitting: Orthotics

## 2017-05-25 DIAGNOSIS — K130 Diseases of lips: Secondary | ICD-10-CM | POA: Diagnosis not present

## 2017-05-26 DIAGNOSIS — R51 Headache: Secondary | ICD-10-CM | POA: Diagnosis not present

## 2017-05-26 DIAGNOSIS — Z79899 Other long term (current) drug therapy: Secondary | ICD-10-CM | POA: Diagnosis not present

## 2017-05-26 DIAGNOSIS — K130 Diseases of lips: Secondary | ICD-10-CM | POA: Diagnosis not present

## 2017-05-26 DIAGNOSIS — E119 Type 2 diabetes mellitus without complications: Secondary | ICD-10-CM | POA: Diagnosis not present

## 2017-05-30 DIAGNOSIS — M722 Plantar fascial fibromatosis: Secondary | ICD-10-CM

## 2017-05-31 ENCOUNTER — Other Ambulatory Visit: Payer: Medicare Other | Admitting: Orthotics

## 2017-06-05 DIAGNOSIS — Z79899 Other long term (current) drug therapy: Secondary | ICD-10-CM | POA: Diagnosis not present

## 2017-06-05 DIAGNOSIS — E119 Type 2 diabetes mellitus without complications: Secondary | ICD-10-CM | POA: Diagnosis not present

## 2017-06-05 DIAGNOSIS — K130 Diseases of lips: Secondary | ICD-10-CM | POA: Diagnosis not present

## 2017-06-05 DIAGNOSIS — R51 Headache: Secondary | ICD-10-CM | POA: Diagnosis not present

## 2017-06-05 DIAGNOSIS — Z049 Encounter for examination and observation for unspecified reason: Secondary | ICD-10-CM | POA: Diagnosis not present

## 2017-06-13 DIAGNOSIS — S60562A Insect bite (nonvenomous) of left hand, initial encounter: Secondary | ICD-10-CM | POA: Diagnosis not present

## 2017-06-14 ENCOUNTER — Ambulatory Visit (INDEPENDENT_AMBULATORY_CARE_PROVIDER_SITE_OTHER): Payer: Medicare Other | Admitting: Orthotics

## 2017-06-14 DIAGNOSIS — M7741 Metatarsalgia, right foot: Secondary | ICD-10-CM

## 2017-06-14 DIAGNOSIS — M722 Plantar fascial fibromatosis: Secondary | ICD-10-CM

## 2017-06-14 NOTE — Progress Notes (Signed)
Patient came in today to pick up custom made foot orthotics.  The goals were accomplished and the patient reported no dissatisfaction with said orthotics.  Patient was advised of breakin period and how to report any issues. 

## 2017-06-15 ENCOUNTER — Ambulatory Visit (INDEPENDENT_AMBULATORY_CARE_PROVIDER_SITE_OTHER): Payer: Medicare Other | Admitting: Sports Medicine

## 2017-06-15 ENCOUNTER — Encounter: Payer: Self-pay | Admitting: Sports Medicine

## 2017-06-15 VITALS — BP 124/78 | Ht 70.0 in | Wt 208.0 lb

## 2017-06-15 DIAGNOSIS — M79669 Pain in unspecified lower leg: Secondary | ICD-10-CM | POA: Diagnosis not present

## 2017-06-15 NOTE — Patient Instructions (Signed)
Try doing the exercises that we showed you.  Try wearing the compression sleeve with exercise.  Usually new orthotics.  Use ice after exercise if available.

## 2017-06-15 NOTE — Progress Notes (Signed)
   Subjective:    Patient ID: Glenn Robertson, male    DOB: 04-Jun-1966, 51 y.o.   MRN: [id number]  HPI chief complaint: Right lower leg pain  Glenn Robertson comes in today complaining of 2 weeks of right lower leg pain. He has a history of chronic shin splints and his pain is identical in nature to what he's experienced previously. He describes pain both medial and lateral to the tibia. It is worse with activity such as running. He has been doing some foam rolling and this has resulted in resolution of the lateral lower leg pain but he still has medial lower leg pain. He has been trying some stretches. He denies numbness or tingling. Denies a feeling of tightness in his leg. Denies symptoms in the left leg. He does have a history of sciatica but his current pain is different than what he has expressed previously with this. He is also followed by podiatry and was placed into a new pair of custom orthotics yesterday.    Review of Systems As above     Objective:   Physical Exam  Well-developed, well-nourished. No acute distress. Awake alert and oriented 3. Vital signs reviewed  Right lower leg: There slight tenderness to palpation along the medial tibial border. No tenderness along the lateral tibial border. No tenderness to palpation or percussion along the tibia. Calf is supple. Pes planus with standing and pronation with walking. Patient is able to run without a limp.      Assessment & Plan:  Chronic right lower leg pain secondary to medial tibial stress syndrome  Clinical suspicion for tibial stress fracture is low. He will continue in his new orthotics and we will add some exercises including heel walks, toe walks, and stretching. I've also recommended icing after activity and compression with running. I think he can continue with activity as tolerated using pain as his guide and he will let me know if his symptoms persist or worsen. Follow-up as needed.

## 2017-06-20 DIAGNOSIS — G603 Idiopathic progressive neuropathy: Secondary | ICD-10-CM | POA: Diagnosis not present

## 2017-06-20 DIAGNOSIS — G3184 Mild cognitive impairment, so stated: Secondary | ICD-10-CM | POA: Diagnosis not present

## 2017-06-20 DIAGNOSIS — R270 Ataxia, unspecified: Secondary | ICD-10-CM | POA: Diagnosis not present

## 2017-06-20 DIAGNOSIS — G5603 Carpal tunnel syndrome, bilateral upper limbs: Secondary | ICD-10-CM | POA: Diagnosis not present

## 2017-06-20 DIAGNOSIS — M2550 Pain in unspecified joint: Secondary | ICD-10-CM | POA: Diagnosis not present

## 2017-06-20 DIAGNOSIS — M5417 Radiculopathy, lumbosacral region: Secondary | ICD-10-CM | POA: Diagnosis not present

## 2017-06-30 DIAGNOSIS — M5126 Other intervertebral disc displacement, lumbar region: Secondary | ICD-10-CM | POA: Diagnosis not present

## 2017-06-30 DIAGNOSIS — Z6829 Body mass index (BMI) 29.0-29.9, adult: Secondary | ICD-10-CM | POA: Diagnosis not present

## 2017-06-30 DIAGNOSIS — M5416 Radiculopathy, lumbar region: Secondary | ICD-10-CM | POA: Diagnosis not present

## 2017-07-14 DIAGNOSIS — F310 Bipolar disorder, current episode hypomanic: Secondary | ICD-10-CM | POA: Diagnosis not present

## 2017-07-14 DIAGNOSIS — Z79899 Other long term (current) drug therapy: Secondary | ICD-10-CM | POA: Diagnosis not present

## 2017-07-24 DIAGNOSIS — M12271 Villonodular synovitis (pigmented), right ankle and foot: Secondary | ICD-10-CM | POA: Diagnosis not present

## 2017-07-28 DIAGNOSIS — E119 Type 2 diabetes mellitus without complications: Secondary | ICD-10-CM | POA: Diagnosis not present

## 2017-08-02 DIAGNOSIS — M5416 Radiculopathy, lumbar region: Secondary | ICD-10-CM | POA: Diagnosis not present

## 2017-09-28 DIAGNOSIS — M5126 Other intervertebral disc displacement, lumbar region: Secondary | ICD-10-CM | POA: Diagnosis not present

## 2017-09-28 DIAGNOSIS — Z6829 Body mass index (BMI) 29.0-29.9, adult: Secondary | ICD-10-CM | POA: Diagnosis not present

## 2017-09-28 DIAGNOSIS — M5416 Radiculopathy, lumbar region: Secondary | ICD-10-CM | POA: Diagnosis not present

## 2017-09-28 DIAGNOSIS — M79671 Pain in right foot: Secondary | ICD-10-CM | POA: Diagnosis not present

## 2017-10-02 ENCOUNTER — Ambulatory Visit (INDEPENDENT_AMBULATORY_CARE_PROVIDER_SITE_OTHER): Payer: Medicare Other | Admitting: Orthopaedic Surgery

## 2017-10-02 ENCOUNTER — Encounter (INDEPENDENT_AMBULATORY_CARE_PROVIDER_SITE_OTHER): Payer: Self-pay | Admitting: Orthopaedic Surgery

## 2017-10-02 DIAGNOSIS — M79671 Pain in right foot: Secondary | ICD-10-CM | POA: Diagnosis not present

## 2017-10-02 DIAGNOSIS — M79672 Pain in left foot: Secondary | ICD-10-CM

## 2017-10-02 NOTE — Progress Notes (Signed)
Office Visit Note   Patient: Glenn Robertson           Date of Birth: [birth year]           MRN: [id number] Visit Date: 10/02/2017              Requested by: Leonard Downing, MD [address] [place], [place] [address] PCP: Leonard Downing, MD   Assessment & Plan: Visit Diagnoses:  1. Bilateral foot pain     Plan: Now that Glenn Robertson has bilateral full foot custom orthotics, I have instructed him to take 6-8 weeks of exercising to see if he notes any change.  He will let us know how he feels in the next 6-8 weeks.  Follow-Up Instructions: Return if symptoms worsen or fail to improve.   Orders:  No orders of the defined types were placed in this encounter.  No orders of the defined types were placed in this encounter.     Procedures: No procedures performed   Clinical Data: No additional findings.   Subjective: Chief Complaint  Patient presents with  . Right Knee - Pain  . Right Foot - Pain    HPI Glenn Robertson is a 52 year old gentleman new patient who presents to our clinic today with bilateral foot pain.  The right foot began hurting back in June 2017 with a left foot starting in December 2018.  No known injury or change in activity.  The bottom of both feet hurt over the fifth metatarsal heads.  This only occurs with activity to include leisurely walking and running.  He states he does walk 4 miles a day.  He also notes numbness to the lateral aspect of both feet and into the small toe.  He does have a known history of sciatica where he has had epidural steroid injections by Dr. Debria Garret in the past.  He is also seeing a podiatrist in [place] a few months ago where he was given custom full foot orthotics.  He is not sure whether these are helping.  Of note he did try taking 3 months off of activity back in the fall without relief of symptoms.  Review of Systems as detailed in HPI.  All others reviewed and are negative.   Objective: Vital Signs: There were no  vitals taken for this visit.  Physical Exam well-developed well-nourished gentleman in no acute distress.  Alert and oriented x3.  Ortho Exam examination of both feet reveals pes planus.  He does have minimal tenderness over the fifth metatarsal head.  No tenderness over the fifth metatarsal shafts.  No tenderness along the peroneal or posterior tibial tendon.  Full range of motion.  Specialty Comments:  No specialty comments available.  Imaging: No new images today   PMFS History: Patient Active Problem List   Diagnosis Date Noted  . Bilateral foot pain 04/19/2017  . Pain in shin 10/28/2016  . Left wrist pain 07/12/2016  . Lumbosacral radiculopathy at S1 03/15/2016  . Lumbar degenerative disc disease 12/18/2015  . Subacromial impingement of left shoulder 10/26/2015  . Left lumbar radiculitis 04/21/2015  . Disc displacement, lumbar 04/21/2015  . Chondromalacia of both patellae 02/24/2015  . Paranoid schizophrenia, chronic condition ([place]) 02/24/2015  . Chronic low back pain 07/18/2014  . Knee pain, acute 07/18/2014  . Right ankle pain 01/27/2014  . Pain in joint, ankle and foot 01/27/2014  . Sciatica 01/10/2012  . Thoracic or lumbosacral neuritis or radiculitis, unspecified 12/15/2011  . Ulnar neuropathy at elbow  11/14/2011  . Disturbance of skin sensation 11/14/2011   Past Medical History:  Diagnosis Date  . Allergy   . Asthma   . Chronic pain   . Depression   . Diabetes mellitus   . Neuromuscular disorder ([place])     Family History  Problem Relation Age of Onset  . Diabetes Father   . Diabetes Paternal Uncle     Past Surgical History:  Procedure Laterality Date  . Arm Surgery  1/12   rt ulnar nerve decompression  . EPIDURAL BLOCK INJECTION     several  . ULNAR NERVE TRANSPOSITION  01/19/2012   Procedure: ULNAR NERVE DECOMPRESSION/TRANSPOSITION;  Surgeon: Cammie Sickle., MD;  Location: [facility];  Service: Orthopedics;  Laterality: Left;   ulnar nerve decompression vs transposition left elbow   Social History   Occupational History  . Not on file  Tobacco Use  . Smoking status: Never Smoker  . Smokeless tobacco: Never Used  Substance and Sexual Activity  . Alcohol use: No  . Drug use: No  . Sexual activity: Not on file

## 2017-10-17 DIAGNOSIS — E119 Type 2 diabetes mellitus without complications: Secondary | ICD-10-CM | POA: Diagnosis not present

## 2017-10-17 DIAGNOSIS — H401231 Low-tension glaucoma, bilateral, mild stage: Secondary | ICD-10-CM | POA: Diagnosis not present

## 2017-10-17 DIAGNOSIS — H43813 Vitreous degeneration, bilateral: Secondary | ICD-10-CM | POA: Diagnosis not present

## 2017-10-17 DIAGNOSIS — Z79899 Other long term (current) drug therapy: Secondary | ICD-10-CM | POA: Diagnosis not present

## 2017-10-17 DIAGNOSIS — F410 Panic disorder [episodic paroxysmal anxiety] without agoraphobia: Secondary | ICD-10-CM | POA: Diagnosis not present

## 2017-10-24 ENCOUNTER — Ambulatory Visit (INDEPENDENT_AMBULATORY_CARE_PROVIDER_SITE_OTHER): Payer: Medicare Other | Admitting: Psychiatry

## 2017-10-24 ENCOUNTER — Encounter (HOSPITAL_COMMUNITY): Payer: Self-pay | Admitting: Psychiatry

## 2017-10-24 DIAGNOSIS — F4010 Social phobia, unspecified: Secondary | ICD-10-CM | POA: Diagnosis not present

## 2017-10-24 DIAGNOSIS — F200 Paranoid schizophrenia: Secondary | ICD-10-CM | POA: Diagnosis not present

## 2017-10-24 DIAGNOSIS — R450 Nervousness: Secondary | ICD-10-CM

## 2017-10-24 DIAGNOSIS — F251 Schizoaffective disorder, depressive type: Secondary | ICD-10-CM

## 2017-10-24 DIAGNOSIS — Z736 Limitation of activities due to disability: Secondary | ICD-10-CM

## 2017-10-24 DIAGNOSIS — F419 Anxiety disorder, unspecified: Secondary | ICD-10-CM | POA: Diagnosis not present

## 2017-10-24 NOTE — Progress Notes (Signed)
Psychiatric Initial Adult Assessment   Patient Identification: Glenn Robertson MRN:  [id number] Date of Evaluation:  10/24/2017 Referral Source: self Chief Complaint:  schizophrenia Visit Diagnosis:    ICD-10-CM   1. Schizoaffective disorder, depressive type ([place]) F25.1 Ambulatory referral to Psychology  2. Paranoid schizophrenia, chronic condition ([place]) F20.0    History of Present Illness:  Glenn Robertson is a pleasant 52 year old male with a psychiatric history of paranoid schizophrenia, and major depressive disorder.  He shares that he has struggled with mental illness since he was a teenager in high school, and he feels that growing up in a volatile household contributed to this.  He describes his father as being quite disorganized, pacing aggressively around the house naked when he was a child.  He reports that he was persecuted for having white friends when he was in school, and the teachers modified his grades to make him do more poorly in high school.  He reports that he began to struggle with more depression and paranoia in his college years, and feels that sometimes "shady" things were going on in school.  He has always felt that people follow him and that people have been out to get him.  He is particularly focused on issues of race and socially quality.  He reports that he tends to have a running monologue in his head with sorting through the stressors of the past.  He ruminates about past trauma related to bullying or his perception of bullying, and he has periodic fantasies about physically beating his previous alleged that bullies.  He also has periodic thoughts about ending his life.  He tends to occupy himself day-to-day by reading books, watching television, playing video games, goes to [employer].  He does not like being out in public and sometimes becomes very paranoid so he has to leave [employer].  He shares with some grief that he had been on track to get his masters and PhD.  It  is unclear if this is partly grandiosity and delusional thinking.  He reports that he struggles with delusions of grandeur, and he has spent some time learning about his psychiatric illness and diagnosis.  He has been on disability since 2002.  He has held in the upwards of 25-30 different jobs, before he eventually went on disability.  He lives with his mother and reports that they tend to get along okay except that she is very neat and tidy.  I asked patient who had sent him here and what he was looking for in terms of mental health care.  He reports that his psychiatrist Dr. Rosine Door in [place], sent him for individual therapy.  Educated the patient that I am also a psychiatrist and I am a medication management provider.  Discussed that I am happy to provide the patient with a referral for psychologist for individual therapy.  He was appreciative for the referral, and reports that he has a good rapport with his psychiatrist in the community and will continue to see them.   Notably, throughout her interaction the patient has a very flat affect, and a constricted demeanor.  He has difficulty with interpersonal social relatedness.  He is able to compensate for this with fairly intact level of intelligence.  He was able to note that this writer's [place] background, and the PA students [employer].  He shows a significant interest in love of learning different languages, and shares that he is currently trying to learn and teach himself [place].  Associated Signs/Symptoms: Depression Symptoms:  depressed mood, anxiety, (Hypo) Manic Symptoms:  Delusions, Anxiety Symptoms:  Social Anxiety, Psychotic Symptoms:  Delusions, Ideas of Reference, Paranoia, PTSD Symptoms: Negative  Past Psychiatric History: Outpatient psychiatric treatment  Previous Psychotropic Medications: Yes   Substance Abuse History in the last 12 months:  No.  Consequences of Substance Abuse: Negative  Past  Medical History:  Past Medical History:  Diagnosis Date  . Allergy   . Asthma   . Chronic pain   . Depression   . Diabetes mellitus   . Neuromuscular disorder [facility])     Past Surgical History:  Procedure Laterality Date  . Arm Surgery  1/12   rt ulnar nerve decompression  . EPIDURAL BLOCK INJECTION     several  . ULNAR NERVE TRANSPOSITION  01/19/2012   Procedure: ULNAR NERVE DECOMPRESSION/TRANSPOSITION;  Surgeon: Cammie Sickle., MD;  Location: [facility];  Service: Orthopedics;  Laterality: Left;  ulnar nerve decompression vs transposition left elbow    Family Psychiatric History: I suspect he has a family psychiatric history of thought disorder based on what he describes in terms of his father's behaviors, possible substance abuse disorders  Family History:  Family History  Problem Relation Age of Onset  . Diabetes Father   . Diabetes Paternal Uncle     Social History:   Social History   Socioeconomic History  . Marital status: Single    Spouse name: Not on file  . Number of children: Not on file  . Years of education: Not on file  . Highest education level: Not on file  Social Needs  . Financial resource strain: Not on file  . Food insecurity - worry: Not on file  . Food insecurity - inability: Not on file  . Transportation needs - medical: Not on file  . Transportation needs - non-medical: Not on file  Occupational History  . Not on file  Tobacco Use  . Smoking status: Never Smoker  . Smokeless tobacco: Never Used  Substance and Sexual Activity  . Alcohol use: No  . Drug use: No  . Sexual activity: Not on file  Other Topics Concern  . Not on file  Social History Narrative  . Not on file    Additional Social History: Patient lives with his mother and is on disability, never married and no children  Allergies:   Allergies  Allergen Reactions  . Citric Acid Other (See Comments)    Runny nose, eyes water  . Food     Oranges or  anything with citric acid    Metabolic Disorder Labs: No results found for: HGBA1C, MPG No results found for: PROLACTIN No results found for: CHOL, TRIG, HDL, CHOLHDL, VLDL, LDLCALC   Current Medications: Current Outpatient Medications  Medication Sig Dispense Refill  . ACCU-CHEK AVIVA PLUS test strip TEST as directed UP TO 3 TIMES DAILY  0  . albuterol (PROVENTIL HFA;VENTOLIN HFA) 108 (90 BASE) MCG/ACT inhaler Inhale 2 puffs into the lungs as needed.    Marland Kitchen buPROPion (WELLBUTRIN XL) 300 MG 24 hr tablet Take 300 mg by mouth daily.    . clonazePAM (KLONOPIN) 1 MG tablet Take 1 mg by mouth 2 (two) times daily as needed.    . fluticasone (FLONASE) 50 MCG/ACT nasal spray Place 2 sprays into the nose at bedtime as needed.    . gabapentin (NEURONTIN) 300 MG capsule Take 2 capsules (600 mg total) by mouth 2 (two) times daily. 120 capsule 2  .  glipiZIDE (GLUCOTROL) 10 MG tablet Take 15 mg by mouth 2 (two) times daily.     Marland Kitchen LATUDA 120 MG TABS Take 1 tablet by mouth daily.  0  . meloxicam (MOBIC) 15 MG tablet Take 15 mg by mouth daily.  0  . metFORMIN (GLUCOPHAGE) 1000 MG tablet Take 500 mg by mouth Twice daily.     . montelukast (SINGULAIR) 10 MG tablet Take 10 mg by mouth at bedtime.    . pioglitazone (ACTOS) 30 MG tablet      No current facility-administered medications for this visit.     Neurologic: Headache: Negative Seizure: Negative Paresthesias:Negative  Musculoskeletal: Strength & Muscle Tone: within normal limits Gait & Station: normal Patient leans: N/A  Psychiatric Specialty Exam: Review of Systems  Constitutional: Negative.   HENT: Negative.   Respiratory: Negative.   Cardiovascular: Negative.   Gastrointestinal: Negative.   Musculoskeletal: Negative.   Neurological: Negative.   Psychiatric/Behavioral: Positive for depression. Negative for hallucinations, memory loss, substance abuse and suicidal ideas. The patient is nervous/anxious. The patient does not have  insomnia.        Paranoid     There were no vitals taken for this visit.There is no height or weight on file to calculate BMI.  General Appearance: Casual, Fairly Groomed and [place], constricted  Eye Contact:  Fair  Speech:  Normal Rate  Volume:  Decreased  Mood:  Anxious  Affect:  Flat  Thought Process:  Goal Directed and Descriptions of Associations: Intact  Orientation:  Full (Time, Place, and Person)  Thought Content:  Delusions, Ideas of Reference:   Paranoia and Paranoid Ideation  Suicidal Thoughts:  No  Homicidal Thoughts:  No  Memory:  Immediate;   Fair  Judgement:  Fair  Insight:  Fair and Shallow  Psychomotor Activity:  Normal  Concentration:  Concentration: Fair  Recall:  [place] of Knowledge:Good  Language: Good  Akathisia:  Negative  Handed:  Right  AIMS (if indicated):  na  Assets:  Communication Skills Desire for Improvement Financial Resources/Insurance Housing Social Support Transportation  ADL's:  Intact  Cognition: WNL  Sleep:  fair    Treatment Plan Summary: Siddh Vandeventer is a 52 year old male with a psychiatric history consistent with chronic paranoid schizophrenia, schizoaffective disorder depressed type, who presents today for therapy assessment.  He has medication management in the community with Dr. Rosine Door and wishes to continue in care with that provider.  He understands that this writer does not provide individual therapy, and is agreeable to a referral to appropriate resources.  He does not present with any acute suicidality or homicidality, but does struggle with chronic fantasies of harming the past bullies in his life.  He does continue to struggle with paranoia which seems to affect his ability to enter into the community on a regular basis.  He does not present with any gross psychotic symptoms or agitation.  He may ultimately be an appropriate candidate for clozapine and I will defer to his primary psychiatrist on this.  I believe he would  benefit from participation in individual therapy -he reports that he had previously been engaged in individual therapy for nearly 14 years until his therapist had died in 12-03-2014.  This was an important relationship for him, and I believe he is now ready to reestablish new individual therapy.  He presents with fair insight into his illness, as much as can be expected with a thought disorder.  His intelligence unfortunately also is a risk factor given  that he is quite aware of his reduced level of function as compared to his younger years.  I am hopeful that therapy will be a useful support for him and he is agreeable to the referral.  1. Schizoaffective disorder, depressive type ([place])   2. Paranoid schizophrenia, chronic condition ([place])     Status of current problems: new to [employer] Ordered: Orders Placed This Encounter  Procedures  . Ambulatory referral to Psychology    Referral Priority:   Routine    Referral Type:   Psychiatric    Referral Reason:   Specialty Services Required    Requested Specialty:   Psychology    Number of Visits Requested:   1    Labs Reviewed:n/a  Collateral Obtained/Records Reviewed: n/a  Plan:  Referral for individual therapy, CBT Continue medication management with community psychiatrist No further follow-up with writer indicated  I spent 40 minutes with the patient in direct face-to-face clinical care.  Greater than 50% of this time was spent in counseling and coordination of care with the patient.    Aundra Dubin, MD 2/26/201910:05 AM

## 2017-10-26 ENCOUNTER — Ambulatory Visit: Payer: Medicare Other | Admitting: Podiatry

## 2017-10-30 ENCOUNTER — Telehealth (INDEPENDENT_AMBULATORY_CARE_PROVIDER_SITE_OTHER): Payer: Self-pay | Admitting: Orthopaedic Surgery

## 2017-10-30 NOTE — Telephone Encounter (Signed)
Patient called asked if he can be referred to Dr Sharol Given for bil foot pain. The number to contact patient is [phone]

## 2017-10-31 DIAGNOSIS — Z049 Encounter for examination and observation for unspecified reason: Secondary | ICD-10-CM | POA: Diagnosis not present

## 2017-10-31 DIAGNOSIS — Z79899 Other long term (current) drug therapy: Secondary | ICD-10-CM | POA: Diagnosis not present

## 2017-10-31 DIAGNOSIS — E119 Type 2 diabetes mellitus without complications: Secondary | ICD-10-CM | POA: Diagnosis not present

## 2017-10-31 DIAGNOSIS — Z1211 Encounter for screening for malignant neoplasm of colon: Secondary | ICD-10-CM | POA: Diagnosis not present

## 2017-10-31 NOTE — Telephone Encounter (Signed)
yes

## 2017-10-31 NOTE — Telephone Encounter (Signed)
Can you call to make him an appt with Dr Sharol Given please. Per patients request okay per Dr Erlinda Hong.

## 2017-10-31 NOTE — Telephone Encounter (Signed)
See message below °

## 2017-11-01 DIAGNOSIS — M461 Sacroiliitis, not elsewhere classified: Secondary | ICD-10-CM | POA: Diagnosis not present

## 2017-11-09 ENCOUNTER — Ambulatory Visit (INDEPENDENT_AMBULATORY_CARE_PROVIDER_SITE_OTHER): Payer: Medicare Other | Admitting: Orthopedic Surgery

## 2017-11-09 ENCOUNTER — Ambulatory Visit: Payer: Medicare Other | Admitting: Sports Medicine

## 2017-11-13 ENCOUNTER — Encounter: Payer: Self-pay | Admitting: Sports Medicine

## 2017-11-13 ENCOUNTER — Ambulatory Visit (INDEPENDENT_AMBULATORY_CARE_PROVIDER_SITE_OTHER): Payer: Medicare Other | Admitting: Sports Medicine

## 2017-11-13 VITALS — BP 112/80 | Ht 70.0 in | Wt 210.0 lb

## 2017-11-13 DIAGNOSIS — M25572 Pain in left ankle and joints of left foot: Secondary | ICD-10-CM | POA: Diagnosis not present

## 2017-11-13 NOTE — Progress Notes (Signed)
   Subjective:    Patient ID: Glenn Robertson, male    DOB: Nov 02, 1965, 52 y.o.   MRN: [id number]  HPI chief complaint: Left foot pain  Glenn Robertson comes in today complaining of left foot pain that he localizes to the plantar aspect of the foot. It is along the arch and distal forefoot.He has some new custom orthotics. He is an avid runner. No injury that he can recall. No swelling. No numbness or tingling.   Review of Systems As above    Objective:   Physical Exam  Well-developed, well-nourished. No acute distress. Awake alert and oriented 3. Vital signs reviewed  Left foot: No soft tissue swelling. Slight tenderness to palpation along the plantar aspect of the foot starting at the fifth metatarsal head and going proximal into the arch. No pain with metatarsal squeeze. Good pulses. Neurovascular intact distally.      Assessment & Plan:    left foot pain  I added some cushioning along the fifth ray of his custom orthotic. I think he can continue with activity including walking as tolerated. He has a follow-up appointment with podiatry tomorrow. I've encouraged him to keep that appointment. He is also asking for some ankle strengthening exercises. He was educated by our ATC and given [employer].  Follow-up with me as needed.

## 2017-11-14 ENCOUNTER — Ambulatory Visit (INDEPENDENT_AMBULATORY_CARE_PROVIDER_SITE_OTHER): Payer: Medicare Other | Admitting: Podiatry

## 2017-11-14 ENCOUNTER — Ambulatory Visit (INDEPENDENT_AMBULATORY_CARE_PROVIDER_SITE_OTHER): Payer: Medicare Other

## 2017-11-14 ENCOUNTER — Ambulatory Visit: Payer: Medicare Other

## 2017-11-14 ENCOUNTER — Encounter: Payer: Self-pay | Admitting: Podiatry

## 2017-11-14 DIAGNOSIS — M778 Other enthesopathies, not elsewhere classified: Secondary | ICD-10-CM

## 2017-11-14 DIAGNOSIS — M779 Enthesopathy, unspecified: Principal | ICD-10-CM

## 2017-11-14 DIAGNOSIS — M7752 Other enthesopathy of left foot: Secondary | ICD-10-CM

## 2017-11-14 DIAGNOSIS — M722 Plantar fascial fibromatosis: Secondary | ICD-10-CM

## 2017-11-14 NOTE — Progress Notes (Signed)
He presents today for follow-up of pain to the bilateral forefoot along the lateral sides.  He says orthotics are helping.  He states that after long distances him on the wall to start metatarsals bilateral.  Objective: Vital signs are stable he is alert oriented x3.  Pulses are palpable.  No reproducible pain on palpation today.  Evaluation of the shoes and orthotics demonstrates good may orthotics however he is wearing them in a valgus shoe at this point I feel that more likely is overcorrection.  Assessment: Metatarsalgia.  Plan: Discussed getting neutral shoe gear with him.  He understands that and is.

## 2017-11-21 ENCOUNTER — Ambulatory Visit (INDEPENDENT_AMBULATORY_CARE_PROVIDER_SITE_OTHER): Payer: Medicare Other | Admitting: Orthopaedic Surgery

## 2017-11-23 ENCOUNTER — Encounter (INDEPENDENT_AMBULATORY_CARE_PROVIDER_SITE_OTHER): Payer: Self-pay | Admitting: Orthopaedic Surgery

## 2017-11-23 ENCOUNTER — Ambulatory Visit (INDEPENDENT_AMBULATORY_CARE_PROVIDER_SITE_OTHER): Payer: Medicare Other

## 2017-11-23 ENCOUNTER — Ambulatory Visit (INDEPENDENT_AMBULATORY_CARE_PROVIDER_SITE_OTHER): Payer: Medicare Other | Admitting: Orthopaedic Surgery

## 2017-11-23 DIAGNOSIS — M25551 Pain in right hip: Secondary | ICD-10-CM

## 2017-11-23 DIAGNOSIS — M545 Low back pain: Secondary | ICD-10-CM

## 2017-11-23 MED ORDER — PREDNISONE 10 MG (21) PO TBPK: ORAL_TABLET | ORAL | 0 refills | Status: DC

## 2017-11-23 NOTE — Progress Notes (Signed)
Office Visit Note   Patient: Glenn Robertson           Date of Birth: [birth year]           MRN: [id number] Visit Date: 11/23/2017              Requested by: Leonard Downing, MD [place], [place] [address] PCP: Leonard Downing, MD   Assessment & Plan: Visit Diagnoses:  1. Pain in right hip   2. Low back pain, unspecified back pain laterality, unspecified chronicity, with sciatica presence unspecified     Plan: Today, we will call in a prescription for a Sterapred taper.  We will also obtain an MRI of his lumbar spine.  He will follow-up with [place] to discuss further treatment options once the MRI is completed.  He will call with concerns or questions in the meantime  Follow-Up Instructions: Return in about 10 days (around 12/03/2017).   Orders:  Orders Placed This Encounter  Procedures  . XR HIP UNILAT W OR W/O PELVIS 2-3 VIEWS RIGHT  . XR Lumbar Spine 2-3 Views  . MR Lumbar Spine w/o contrast   No orders of the defined types were placed in this encounter.     Procedures: No procedures performed   Clinical Data: No additional findings.   Subjective: Chief Complaint  Patient presents with  . Right Hip - Pain, Follow-up    HPI Glenn Robertson is a 52 year old gentleman who presents our clinic today with right-sided buttocks pain.  This began approximately a year ago without any known injury or change in activity.  The pain he has is to the right buttocks radiating down the posterior aspect of the right leg.  No groin or anterior thigh pain.  No lateral hip pain.  The pain he has is intermittent in nature.  This is aggravated with walking, running, and anytime he puts significant weight on his right leg.  He is tried over-the-counter medications with out relief of pain.  He does note numbness to the top of both feet.  He also states that he saw a sports medicine doctor in town who referred him for an SI joint injection which somewhat helped but only lasted for  4 days.  He was also sent to physical therapy which was of no relief.  He does note a previous history of lumbar pathology for which she has seen Dr. Brien Few.  He denies any epidural steroid injections.  No saddle paresthesias and no bowel or bladder incontinence.  Review of Systems as detailed in HPI.  All others reviewed and are negative.   Objective: Vital Signs: There were no vitals taken for this visit.  Physical Exam well-developed well-nourished gentleman in no acute distress.  Alert and oriented x3.  Ortho Exam examination of his right lower extremity reveals mild tenderness over the SI joint.  Minimally increased pain with lumbar flexion.  No pain with lumbar extension.  Positive straight leg raise on the right.  Negative logroll and negative Fader.  He has full strength throughout.  He is neurovascular intact distally.  Specialty Comments:  No specialty comments available.  Imaging: Xr Hip Unilat W Or W/o Pelvis 2-3 Views Right  Result Date: 11/23/2017 Negative for structural abnormalities  Xr Lumbar Spine 2-3 Views  Result Date: 11/23/2017 Decreased disc space at L5-S1.    PMFS History: Patient Active Problem List   Diagnosis Date Noted  . Left wrist pain 07/12/2016  . Lumbosacral radiculopathy at S1 03/15/2016  .  Lumbar degenerative disc disease 12/18/2015  . Subacromial impingement of left shoulder 10/26/2015  . Left lumbar radiculitis 04/21/2015  . Disc displacement, lumbar 04/21/2015  . Chondromalacia of both patellae 02/24/2015  . Paranoid schizophrenia, chronic condition ([place]) 02/24/2015  . Low back pain 07/18/2014  . Knee pain, acute 07/18/2014  . Right ankle pain 01/27/2014  . Pain in joint, ankle and foot 01/27/2014  . Sciatica 01/10/2012  . Thoracic or lumbosacral neuritis or radiculitis, unspecified 12/15/2011  . Disturbance of skin sensation 11/14/2011   Past Medical History:  Diagnosis Date  . Allergy   . Asthma   . Chronic pain   . Depression    . Diabetes mellitus   . Neuromuscular disorder ([place])     Family History  Problem Relation Age of Onset  . Diabetes Father   . Diabetes Paternal Uncle     Past Surgical History:  Procedure Laterality Date  . Arm Surgery  1/12   rt ulnar nerve decompression  . EPIDURAL BLOCK INJECTION     several  . ULNAR NERVE TRANSPOSITION  01/19/2012   Procedure: ULNAR NERVE DECOMPRESSION/TRANSPOSITION;  Surgeon: Cammie Sickle., MD;  Location: [facility];  Service: Orthopedics;  Laterality: Left;  ulnar nerve decompression vs transposition left elbow   Social History   Occupational History  . Not on file  Tobacco Use  . Smoking status: Never Smoker  . Smokeless tobacco: Never Used  Substance and Sexual Activity  . Alcohol use: No  . Drug use: No  . Sexual activity: Not on file

## 2017-11-25 ENCOUNTER — Ambulatory Visit
Admission: RE | Admit: 2017-11-25 | Discharge: 2017-11-25 | Disposition: A | Discharge status: Home / Self Care | Payer: Medicare Other | Source: Ambulatory Visit | Attending: Orthopaedic Surgery | Admitting: Orthopaedic Surgery

## 2017-11-25 DIAGNOSIS — M545 Low back pain: Secondary | ICD-10-CM

## 2017-11-28 ENCOUNTER — Ambulatory Visit (INDEPENDENT_AMBULATORY_CARE_PROVIDER_SITE_OTHER): Payer: Medicare Other | Admitting: Orthopaedic Surgery

## 2017-11-28 ENCOUNTER — Encounter (INDEPENDENT_AMBULATORY_CARE_PROVIDER_SITE_OTHER): Payer: Self-pay | Admitting: Orthopaedic Surgery

## 2017-11-28 ENCOUNTER — Ambulatory Visit (INDEPENDENT_AMBULATORY_CARE_PROVIDER_SITE_OTHER): Payer: Medicare Other

## 2017-11-28 DIAGNOSIS — M545 Low back pain: Secondary | ICD-10-CM

## 2017-11-28 NOTE — Progress Notes (Signed)
Office Visit Note   Patient: Glenn Robertson           Date of Birth: 07-20-1966           MRN: [id number] Visit Date: 11/28/2017              Requested by: Leonard Downing, MD [place], [place] [address] PCP: Leonard Downing, MD   Assessment & Plan: Visit Diagnoses:  1. Low back pain, unspecified back pain laterality, unspecified chronicity, with sciatica presence unspecified     Plan: MRI of the lumbar spine reveals an L5-S1 disc herniation with eccentric bulging towards the left side with impingement of the S1 nerve.  These findings were reviewed with the patient.  My recommendation is to avoid any weight lifting or running or any any impact activities  Until he feels better.  I do recommend physical therapy.  We discussed an option of epidural steroid injection which he would like to hold off on for now.  Follow-up with [place] as needed.  Questions encouraged and answered. Total face to face encounter time was greater than 25 minutes and over half of this time was spent in counseling and/or coordination of care.  Follow-Up Instructions: Return if symptoms worsen or fail to improve.   Orders:  No orders of the defined types were placed in this encounter.  No orders of the defined types were placed in this encounter.     Procedures: No procedures performed   Clinical Data: No additional findings.   Subjective: Chief Complaint  Patient presents with  . Lower Back - Pain, Follow-up    Patient is following up today for MRI review of his lumbar spine.  He has 2 more days left on his prednisone taper.  Symptoms are unchanged.   Review of Systems   Objective: Vital Signs: There were no vitals taken for this visit.  Physical Exam  Ortho Exam Exam is stable Specialty Comments:  No specialty comments available.  Imaging: No results found.   PMFS History: Patient Active Problem List   Diagnosis Date Noted  . Left wrist pain 07/12/2016    . Lumbosacral radiculopathy at S1 03/15/2016  . Lumbar degenerative disc disease 12/18/2015  . Subacromial impingement of left shoulder 10/26/2015  . Left lumbar radiculitis 04/21/2015  . Disc displacement, lumbar 04/21/2015  . Chondromalacia of both patellae 02/24/2015  . Paranoid schizophrenia, chronic condition ([place]) 02/24/2015  . Low back pain 07/18/2014  . Knee pain, acute 07/18/2014  . Right ankle pain 01/27/2014  . Pain in joint, ankle and foot 01/27/2014  . Sciatica 01/10/2012  . Thoracic or lumbosacral neuritis or radiculitis, unspecified 12/15/2011  . Disturbance of skin sensation 11/14/2011   Past Medical History:  Diagnosis Date  . Allergy   . Asthma   . Chronic pain   . Depression   . Diabetes mellitus   . Neuromuscular disorder ([place])     Family History  Problem Relation Age of Onset  . Diabetes Father   . Diabetes Paternal Uncle     Past Surgical History:  Procedure Laterality Date  . Arm Surgery  1/12   rt ulnar nerve decompression  . EPIDURAL BLOCK INJECTION     several  . ULNAR NERVE TRANSPOSITION  01/19/2012   Procedure: ULNAR NERVE DECOMPRESSION/TRANSPOSITION;  Surgeon: Cammie Sickle., MD;  Location: [facility];  Service: Orthopedics;  Laterality: Left;  ulnar nerve decompression vs transposition left elbow   Social History  Occupational History  . Not on file  Tobacco Use  . Smoking status: Never Smoker  . Smokeless tobacco: Never Used  Substance and Sexual Activity  . Alcohol use: No  . Drug use: No  . Sexual activity: Not on file

## 2017-11-29 ENCOUNTER — Telehealth (INDEPENDENT_AMBULATORY_CARE_PROVIDER_SITE_OTHER): Payer: Self-pay | Admitting: Orthopaedic Surgery

## 2017-11-29 ENCOUNTER — Ambulatory Visit (INDEPENDENT_AMBULATORY_CARE_PROVIDER_SITE_OTHER): Payer: Medicare Other | Admitting: Psychology

## 2017-11-29 DIAGNOSIS — F331 Major depressive disorder, recurrent, moderate: Secondary | ICD-10-CM

## 2017-11-29 DIAGNOSIS — F4310 Post-traumatic stress disorder, unspecified: Secondary | ICD-10-CM

## 2017-11-29 DIAGNOSIS — F411 Generalized anxiety disorder: Secondary | ICD-10-CM

## 2017-11-29 NOTE — Telephone Encounter (Signed)
See message below °

## 2017-11-29 NOTE — Telephone Encounter (Signed)
Patient wants a call back from Dr. Erlinda Hong pertaining to letting him know that he wants to proceed with the strongest epidural he can get. Also, questioning if he can do body weight exercises and when he wants him to test back/si joint? Patient plans to run down street .2 mile to see what happens. He can do it today or tomorrow whenever Dr. Erlinda Hong would like. Please advise  # [phone]

## 2017-11-29 NOTE — Telephone Encounter (Signed)
Dr. Ernestina Patches will inject the amount of steroid that is indicated but it is his decision.  I do not recommend any weight lifting or running at this time like I had already explained yesterday but if he feels compelled to do so that's his prerogative.

## 2017-11-30 ENCOUNTER — Telehealth (INDEPENDENT_AMBULATORY_CARE_PROVIDER_SITE_OTHER): Payer: Self-pay | Admitting: Orthopaedic Surgery

## 2017-11-30 NOTE — Telephone Encounter (Signed)
See message below °

## 2017-11-30 NOTE — Telephone Encounter (Signed)
Called patient to advise   Can he walk or ride a bike? Can he do NWB---body weight excerises.    Wants to know if a disk is bulging on the right side?

## 2017-11-30 NOTE — Telephone Encounter (Signed)
Diagnosed in 2002 November

## 2017-11-30 NOTE — Telephone Encounter (Signed)
Walk and NWB body exercises are ok. It's towards the left

## 2017-11-30 NOTE — Telephone Encounter (Signed)
Ok.  That's odd.  Did he just get diagnosed yesterday?

## 2017-11-30 NOTE — Telephone Encounter (Signed)
Patient called wanting to let Dr. Erlinda Hong know that he has Type 2 diabetes now. CB # [phone]

## 2017-12-05 ENCOUNTER — Ambulatory Visit (INDEPENDENT_AMBULATORY_CARE_PROVIDER_SITE_OTHER): Payer: Medicare Other | Admitting: Orthopaedic Surgery

## 2017-12-05 ENCOUNTER — Encounter (INDEPENDENT_AMBULATORY_CARE_PROVIDER_SITE_OTHER): Payer: Self-pay | Admitting: Orthopaedic Surgery

## 2017-12-05 DIAGNOSIS — M5417 Radiculopathy, lumbosacral region: Secondary | ICD-10-CM

## 2017-12-05 NOTE — Progress Notes (Signed)
Patient comes in today to discuss things he can and cannot do, due to his lower back pain and recent MRI studies.  He has been taking it easy as much as possible, but did try to run one day which aggravated his symptoms.  Today, we discussed formal physical therapy to include core strengthening, modalities and traction as well as an epidural steroid injection.  He would like to proceed with both.  A prescription was given to him today for physical therapy and they will call him to set up the ESI.  He will follow-up with [place] on an as-needed basis.

## 2017-12-08 ENCOUNTER — Encounter: Payer: Self-pay | Admitting: Physical Therapy

## 2017-12-08 ENCOUNTER — Ambulatory Visit: Payer: Medicare Other | Attending: Orthopaedic Surgery | Admitting: Physical Therapy

## 2017-12-08 DIAGNOSIS — M545 Low back pain: Secondary | ICD-10-CM | POA: Diagnosis not present

## 2017-12-08 DIAGNOSIS — G8929 Other chronic pain: Secondary | ICD-10-CM | POA: Diagnosis not present

## 2017-12-08 DIAGNOSIS — M5416 Radiculopathy, lumbar region: Secondary | ICD-10-CM | POA: Insufficient documentation

## 2017-12-08 DIAGNOSIS — M6281 Muscle weakness (generalized): Secondary | ICD-10-CM | POA: Diagnosis not present

## 2017-12-08 NOTE — Therapy (Signed)
[place], [name], [address] Phone: [phone]   Fax:  [phone]  Physical Therapy Evaluation  Patient Details  Name: Glenn Robertson MRN: [id number] Date of Birth: February 12, 1966 Referring Provider: Leandrew Koyanagi, MD   Encounter Date: 12/08/2017  PT End of Session - 12/08/17 0937    Visit Number  1    Number of Visits  9    Date for PT Re-Evaluation  01/05/18    Authorization Type  [employer]   PN at visit 10   KX at visit 15    PT Start Time  0932    PT Stop Time  1012    PT Time Calculation (min)  40 min    Activity Tolerance  Patient tolerated treatment well    Behavior During Therapy  [facility] for tasks assessed/performed       Past Medical History:  Diagnosis Date  . Allergy   . Asthma   . Chronic pain   . Depression   . Diabetes mellitus   . Neuromuscular disorder [facility])     Past Surgical History:  Procedure Laterality Date  . Arm Surgery  1/12   rt ulnar nerve decompression  . EPIDURAL BLOCK INJECTION     several  . ULNAR NERVE TRANSPOSITION  01/19/2012   Procedure: ULNAR NERVE DECOMPRESSION/TRANSPOSITION;  Surgeon: Cammie Sickle., MD;  Location: [facility];  Service: Orthopedics;  Laterality: Left;  ulnar nerve decompression vs transposition left elbow    There were no vitals filed for this visit.   Subjective Assessment - 12/08/17 0939    Subjective  I have not been really training for running but resulted in foot pain, turned to walking. Last feb/march Rt hip and low back began hurting with running. Is working with Physiological scientist but MD asked him to stop lifting weights. 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 Denies relief from injection that was done recently. Reports tender points found in hip without referral past hip at this time. Pain increased with 1/10th run recently but I can power through it.     Patient  Stated Goals  return to running, biking    Currently in Pain?  Yes    Pain Score  2     Pain Location  Back    Pain Orientation  Lower;Right    Pain Descriptors / Indicators  Dull it's just there    Aggravating Factors   bridge pressing from Rt leg, running         [facility] PT Assessment - 12/08/17 0001      Assessment   Medical Diagnosis  Lumbar radiculopathy    Referring Provider  Leandrew Koyanagi, MD    Hand Dominance  Right    Prior Therapy  yes- not this year      Precautions   Precautions  None      Restrictions   Weight Bearing Restrictions  No      Balance Screen   Has the patient fallen in the past 6 months  No      [place] residence      Prior Function   Level of Independence  Independent    Vocation Requirements  lifting computers      Cognition   Overall Cognitive Status  Within Functional Limits for tasks assessed      Observation/Other Assessments  Focus on Therapeutic Outcomes (FOTO)   38% limited listed under [employer]   Additional Comments  WFL      Posture/Postural Control   Posture Comments  rounded shoulders      ROM / Strength   AROM / PROM / Strength  Strength      Strength   Overall Strength Comments  poor form for activating abdominal wall      Palpation   Palpation comment  denied TTP today until he used the tennis ball in superior gluts                Objective measurements completed on examination: See above findings.      [facility] Adult PT Treatment/Exercise - 12/08/17 0001      Exercises   Exercises  Lumbar      Lumbar Exercises: Supine   AB Set Limitations  core engagement    Bent Knee Raise Limitations  marching & bil knee raise    Bridge Limitations  cues for core      Lumbar Exercises: Prone   Other Prone Lumbar Exercises  planks-prone and side             PT Education - 12/08/17 0958    Education provided  Yes    Education Details  tennis ball           PT Long Term Goals - 12/08/17 1148      PT LONG TERM GOAL #1   Title  Pt will be able to demonstrate proper use of weight machines with correct form to protect low back    Baseline  will educate as appropriate    Time  4    Period  Weeks    Status  New    Target Date  01/05/18      PT LONG TERM GOAL #2   Title  Pt will be able to power walk without pain in low back/hip in beginning time    Baseline  pain in first 10th of the mile or so reported at eval    Time  4    Period  Weeks    Status  New    Target Date  01/05/18      PT LONG TERM GOAL #3   Title  Pt will be able to jog lightly without increased pain    Baseline  unable due to pain at eval    Time  4    Period  Weeks    Status  New    Target Date  01/05/18      PT LONG TERM GOAL #4   Title  FOTO to 38% limited    Baseline  45% limited at eval    Time  4    Period  Weeks    Status  New    Target Date  01/05/18      PT LONG TERM GOAL #5   Title  Pt will be able to lift computers from floor with proper form/core contraction to protect low back    Baseline  pain at eval    Time  4    Period  Weeks    Status  New    Target Date  01/05/18             Plan - 12/08/17 1020    Clinical Impression Statement  Pt presents to PT with complaints of return of Rt sided LBP with some discomfort into  buttock that is occasional. Works with a Clinical research associate at [employer] and reviewed today correct engagement of abdominal wall for which he will benefit from further training. Good overall strength without ability to recreate pain today. Denied any s/s of radicular pain. Discussed how this round of PT will focus on education and training for proper gym strengthening for long term care.     History and Personal Factors relevant to plan of care:  chronic pain, multiple PT episodes, depression    Clinical Presentation  Stable    Clinical Decision Making  Low    Rehab Potential  Good    PT Frequency  2x / week    PT Duration   4 weeks    PT Treatment/Interventions  ADLs/Self Care Home Management;Cryotherapy;Electrical Stimulation;Ultrasound;Traction;Moist Heat;Therapeutic activities;Therapeutic exercise;Patient/family education;Manual techniques;Passive range of motion;Taping;Dry needling    PT Next Visit Plan  DN to Rt low back/hip musculature PRN, core training- floor work and weight machines    PT Home Exercise Plan  planks and TT holds with correct form    Consulted and Agree with Plan of Care  Patient       Patient will benefit from skilled therapeutic intervention in order to improve the following deficits and impairments:  Pain, Improper body mechanics, Postural dysfunction, Increased muscle spasms, Decreased activity tolerance  Visit Diagnosis: Chronic right-sided low back pain without sciatica - Plan: PT plan of care cert/re-cert     Problem List Patient Active Problem List   Diagnosis Date Noted  . Left wrist pain 07/12/2016  . Lumbosacral radiculopathy at S1 03/15/2016  . Lumbar degenerative disc disease 12/18/2015  . Subacromial impingement of left shoulder 10/26/2015  . Left lumbar radiculitis 04/21/2015  . Disc displacement, lumbar 04/21/2015  . Chondromalacia of both patellae 02/24/2015  . Paranoid schizophrenia, chronic condition ([place]) 02/24/2015  . Low back pain 07/18/2014  . Knee pain, acute 07/18/2014  . Right ankle pain 01/27/2014  . Pain in joint, ankle and foot 01/27/2014  . Sciatica 01/10/2012  . Thoracic or lumbosacral neuritis or radiculitis, unspecified 12/15/2011  . Disturbance of skin sensation 11/14/2011   Glenn Robertson C. Nautika Cressey PT, DPT 12/08/17 11:55 AM   [facility] [address] [place], [name], [address] Phone: [phone]   Fax:  [phone]  Name: Marcellous Snarski MRN: [id number] Date of Birth: 10-01-1965

## 2017-12-11 ENCOUNTER — Ambulatory Visit: Payer: Medicare Other

## 2017-12-11 DIAGNOSIS — M545 Low back pain, unspecified: Secondary | ICD-10-CM

## 2017-12-11 DIAGNOSIS — G8929 Other chronic pain: Secondary | ICD-10-CM

## 2017-12-11 DIAGNOSIS — M6281 Muscle weakness (generalized): Secondary | ICD-10-CM | POA: Diagnosis not present

## 2017-12-11 DIAGNOSIS — M5416 Radiculopathy, lumbar region: Secondary | ICD-10-CM | POA: Diagnosis not present

## 2017-12-11 NOTE — Therapy (Signed)
[place], [name], [address] Phone: [phone]   Fax:  [phone]  Physical Therapy Treatment  Patient Details  Name: Glenn Robertson MRN: [id number] Date of Birth: 1965-10-02 Referring Provider: Leandrew Koyanagi, MD   Encounter Date: 12/11/2017  PT End of Session - 12/11/17 1022    Visit Number  2    Number of Visits  9    Date for PT Re-Evaluation  01/05/18    Authorization Type  MCARE   PN at visit 10   KX at visit 15    PT Start Time  1015    PT Stop Time  1100    PT Time Calculation (min)  45 min    Activity Tolerance  Patient tolerated treatment well    Behavior During Therapy  [facility] for tasks assessed/performed       Past Medical History:  Diagnosis Date  . Allergy   . Asthma   . Chronic pain   . Depression   . Diabetes mellitus   . Neuromuscular disorder [facility])     Past Surgical History:  Procedure Laterality Date  . Arm Surgery  1/12   rt ulnar nerve decompression  . EPIDURAL BLOCK INJECTION     several  . ULNAR NERVE TRANSPOSITION  01/19/2012   Procedure: ULNAR NERVE DECOMPRESSION/TRANSPOSITION;  Surgeon: Cammie Sickle., MD;  Location: [facility];  Service: Orthopedics;  Laterality: Left;  ulnar nerve decompression vs transposition left elbow    There were no vitals filed for this visit.  Subjective Assessment - 12/11/17 1019    Subjective  MD concerned with weight liftiing.  Doing machines at gym.   Trainer with body weight exercies.   Some RT sided back pin with jogging to  car today.     Currently in Pain?  No/denies                       [facility] Adult PT Treatment/Exercise - 12/11/17 0001      Lumbar Exercises: Aerobic   Nustep  6 min LE and UE L7       Lumbar Exercises: Machines for Strengthening   Leg Press  He was able  to maintain good spine posture with 150 pounds and back supported with roll to lower back     Other Lumbar Machine Exercise   pull downs he was able to keep spine stable  at 25 pounds so should do 20 reps,  and chest press  at 20 pounds  but will have him start at 15 pounds. 2x10 reps .       Lumbar Exercises: Standing   Wall Slides  15 reps;Limitations    Wall Slides Limitations  cued to keep back flat and to use ball if available and feet from wall so knee flexion so knee not past ankles.     Other Standing Lumbar Exercises  observed squatting in space and he tended to flex spine.  so used stick for cue to good posture. ( I beleive we have done this in past.                   PT Long Term Goals - 12/08/17 1148      PT LONG TERM GOAL #1   Title  Pt will be able to demonstrate proper use of weight machines with correct form to protect low back    Baseline  will educate as appropriate  Time  4    Period  Weeks    Status  New    Target Date  01/05/18      PT LONG TERM GOAL #2   Title  Pt will be able to power walk without pain in low back/hip in beginning time    Baseline  pain in first 10th of the mile or so reported at eval    Time  4    Period  Weeks    Status  New    Target Date  01/05/18      PT LONG TERM GOAL #3   Title  Pt will be able to jog lightly without increased pain    Baseline  unable due to pain at eval    Time  4    Period  Weeks    Status  New    Target Date  01/05/18      PT LONG TERM GOAL #4   Title  FOTO to 38% limited    Baseline  45% limited at eval    Time  4    Period  Weeks    Status  New    Target Date  01/05/18      PT LONG TERM GOAL #5   Title  Pt will be able to lift computers from floor with proper form/core contraction to protect low back    Baseline  pain at eval    Time  4    Period  Weeks    Status  New    Target Date  01/05/18            Plan - 12/11/17 1028    Clinical Impression Statement  No pain today to start.  or at end .    Time spent on options to RT gym exercies . Cued to not flex knee past ankle if able , limit weight with  leg press to 100 pounds , chest press to 15 and pull down to 15 pounds to start and to keep back supported.    He had questions at end about planks but wa deferred to next session    PT Treatment/Interventions  ADLs/Self Care Home Management;Cryotherapy;Electrical Stimulation;Ultrasound;Traction;Moist Heat;Therapeutic activities;Therapeutic exercise;Patient/family education;Manual techniques;Passive range of motion;Taping;Dry needling    PT Next Visit Plan  DN to Rt low back/hip musculature PRN, core training- floor work and weight machines   review planks     PT Home Exercise Plan  planks and TT holds with correct form,   machines for gym and wall sit    Consulted and Agree with Plan of Care  Patient       Patient will benefit from skilled therapeutic intervention in order to improve the following deficits and impairments:  Pain, Improper body mechanics, Postural dysfunction, Increased muscle spasms, Decreased activity tolerance  Visit Diagnosis: Chronic right-sided low back pain without sciatica  Acute right-sided low back pain without sciatica  Muscle weakness (generalized)  Lumbar radiculitis     Problem List Patient Active Problem List   Diagnosis Date Noted  . Left wrist pain 07/12/2016  . Lumbosacral radiculopathy at S1 03/15/2016  . Lumbar degenerative disc disease 12/18/2015  . Subacromial impingement of left shoulder 10/26/2015  . Left lumbar radiculitis 04/21/2015  . Disc displacement, lumbar 04/21/2015  . Chondromalacia of both patellae 02/24/2015  . Paranoid schizophrenia, chronic condition ([place]) 02/24/2015  . Low back pain 07/18/2014  . Knee pain, acute 07/18/2014  . Right ankle pain 01/27/2014  . Pain in  joint, ankle and foot 01/27/2014  . Sciatica 01/10/2012  . Thoracic or lumbosacral neuritis or radiculitis, unspecified 12/15/2011  . Disturbance of skin sensation 11/14/2011    [name]  PT 12/11/2017, 12:59 PM  [facility] [address] [place], [name], [address] Phone: [phone]   Fax:  [phone]  Name: Glenn Robertson MRN: [id number] Date of Birth: 12/28/1965

## 2017-12-12 ENCOUNTER — Ambulatory Visit (INDEPENDENT_AMBULATORY_CARE_PROVIDER_SITE_OTHER): Payer: Medicare Other | Admitting: Podiatry

## 2017-12-12 ENCOUNTER — Encounter: Payer: Self-pay | Admitting: Podiatry

## 2017-12-12 DIAGNOSIS — M7741 Metatarsalgia, right foot: Secondary | ICD-10-CM | POA: Diagnosis not present

## 2017-12-12 DIAGNOSIS — M722 Plantar fascial fibromatosis: Secondary | ICD-10-CM | POA: Diagnosis not present

## 2017-12-12 NOTE — Progress Notes (Signed)
He states that he is doing better 4 metatarsalgia plantar fasciitis bilaterally he states that my pain depends on his shoe wear and I try to get more of a vertical shoe or neutral shoe but it seemed to hurt my foot.  He presents today with a new pair of [employer] neutral tennis shoes.  Objective: Vital signs are stable he is alert and oriented x3 pulses are strong and palpable.  No reproducible pain on palpation.  No pain on palpation of the plantar fascia or the metatarsal bilateral.  Assessment metatarsalgia plantar fasciitis resolving.  Plan: Discussed appropriate shoe gear once again.  Follow-up with him as needed.

## 2017-12-13 ENCOUNTER — Encounter: Payer: Self-pay | Admitting: Physical Therapy

## 2017-12-13 ENCOUNTER — Ambulatory Visit: Payer: Medicare Other | Admitting: Physical Therapy

## 2017-12-13 DIAGNOSIS — M6281 Muscle weakness (generalized): Secondary | ICD-10-CM

## 2017-12-13 DIAGNOSIS — M545 Low back pain, unspecified: Secondary | ICD-10-CM

## 2017-12-13 DIAGNOSIS — M5416 Radiculopathy, lumbar region: Secondary | ICD-10-CM | POA: Diagnosis not present

## 2017-12-13 DIAGNOSIS — G8929 Other chronic pain: Secondary | ICD-10-CM | POA: Diagnosis not present

## 2017-12-13 NOTE — Therapy (Signed)
[place], [name], [address] Phone: [phone]   Fax:  [phone]  Physical Therapy Treatment  Patient Details  Name: Glenn Robertson MRN: [id number] Date of Birth: [birth year] Referring Provider: Leandrew Koyanagi, MD   Encounter Date: 12/13/2017  PT End of Session - 12/13/17 1419    Visit Number  3    Number of Visits  9    Date for PT Re-Evaluation  01/05/18    Authorization Type  MCARE   PN at visit 10   KX at visit 15    PT Start Time  1417    PT Stop Time  1455    PT Time Calculation (min)  38 min    Activity Tolerance  Patient tolerated treatment well    Behavior During Therapy  [facility] for tasks assessed/performed       Past Medical History:  Diagnosis Date  . Allergy   . Asthma   . Chronic pain   . Depression   . Diabetes mellitus   . Neuromuscular disorder [facility])     Past Surgical History:  Procedure Laterality Date  . Arm Surgery  1/12   rt ulnar nerve decompression  . EPIDURAL BLOCK INJECTION     several  . ULNAR NERVE TRANSPOSITION  01/19/2012   Procedure: ULNAR NERVE DECOMPRESSION/TRANSPOSITION;  Surgeon: Cammie Sickle., MD;  Location: [place];  Service: Orthopedics;  Laterality: Left;  ulnar nerve decompression vs transposition left elbow    There were no vitals filed for this visit.  Subjective Assessment - 12/13/17 1419    Subjective  Worked with trainer this morning, denies pain right now but it usually takes some time.     Patient Stated Goals  return to running, biking    Currently in Pain?  No/denies                       [facility] Adult PT Treatment/Exercise - 12/13/17 0001      Lumbar Exercises: Supine   Bridge Limitations  cues for core    Single Leg Bridge  15 reps    Other Supine Lumbar Exercises  hundreds      Lumbar Exercises: Sidelying   Hip Abduction  Both;20 reps      Lumbar Exercises: Quadruped   Plank  plank form review     Other Quadruped Lumbar Exercises  bird dog, fire hydrant    Other Quadruped Lumbar Exercises  primal push up             PT Education - 12/13/17 1452    Education provided  Yes    Education Details  exercise form/[place], discussion of workout this morning    Person(s) Educated  Patient    Methods  Explanation;Demonstration;Tactile cues;Handout    Comprehension  Verbalized understanding;Need further instruction;Returned demonstration;Verbal cues required;Tactile cues required          PT Long Term Goals - 12/08/17 1148      PT LONG TERM GOAL #1   Title  Pt will be able to demonstrate proper use of weight machines with correct form to protect low back    Baseline  will educate as appropriate    Time  4    Period  Weeks    Status  New    Target Date  01/05/18      PT LONG TERM GOAL #2   Title  Pt will be able to power walk  without pain in low back/hip in beginning time    Baseline  pain in first 10th of the mile or so reported at eval    Time  4    Period  Weeks    Status  New    Target Date  01/05/18      PT LONG TERM GOAL #3   Title  Pt will be able to jog lightly without increased pain    Baseline  unable due to pain at eval    Time  4    Period  Weeks    Status  New    Target Date  01/05/18      PT LONG TERM GOAL #4   Title  FOTO to 38% limited    Baseline  45% limited at eval    Time  4    Period  Weeks    Status  New    Target Date  01/05/18      PT LONG TERM GOAL #5   Title  Pt will be able to lift computers from floor with proper form/core contraction to protect low back    Baseline  pain at eval    Time  4    Period  Weeks    Status  New    Target Date  01/05/18            Plan - 12/13/17 1452    Clinical Impression Statement  Educated on mat exercises for core strengthening with cues for proper form. Denied any pain.     PT Treatment/Interventions  ADLs/Self Care Home Management;Cryotherapy;Electrical  Stimulation;Ultrasound;Traction;Moist Heat;Therapeutic activities;Therapeutic exercise;Patient/family education;Manual techniques;Passive range of motion;Taping;Dry needling    PT Next Visit Plan  DN to Rt low back/hip musculature PRN, core training- floor work and weight machines   review planks     PT Home Exercise Plan  planks and TT holds with correct form,   machines for gym and wall sit, qped bird dog & fire hydrant, hundreds, primal push up    Consulted and Agree with Plan of Care  Patient       Patient will benefit from skilled therapeutic intervention in order to improve the following deficits and impairments:  Pain, Improper body mechanics, Postural dysfunction, Increased muscle spasms, Decreased activity tolerance  Visit Diagnosis: Chronic right-sided low back pain without sciatica  Acute right-sided low back pain without sciatica  Muscle weakness (generalized)     Problem List Patient Active Problem List   Diagnosis Date Noted  . Left wrist pain 07/12/2016  . Lumbosacral radiculopathy at S1 03/15/2016  . Lumbar degenerative disc disease 12/18/2015  . Subacromial impingement of left shoulder 10/26/2015  . Left lumbar radiculitis 04/21/2015  . Disc displacement, lumbar 04/21/2015  . Chondromalacia of both patellae 02/24/2015  . Paranoid schizophrenia, chronic condition ([place]) 02/24/2015  . Low back pain 07/18/2014  . Knee pain, acute 07/18/2014  . Right ankle pain 01/27/2014  . Pain in joint, ankle and foot 01/27/2014  . Sciatica 01/10/2012  . Thoracic or lumbosacral neuritis or radiculitis, unspecified 12/15/2011  . Disturbance of skin sensation 11/14/2011   Chrisma Hurlock C. Anan Dapolito PT, DPT 12/13/17 2:55 PM   [facility] [address] [place], [name], [address] Phone: [phone]   Fax:  [phone]  Name: Gurjit Loconte MRN: [id number] Date of Birth: 12-27-1965

## 2017-12-18 ENCOUNTER — Encounter: Payer: Self-pay | Admitting: Physical Therapy

## 2017-12-18 ENCOUNTER — Ambulatory Visit: Payer: Medicare Other | Admitting: Physical Therapy

## 2017-12-18 ENCOUNTER — Ambulatory Visit: Payer: Medicare Other

## 2017-12-18 DIAGNOSIS — G8929 Other chronic pain: Secondary | ICD-10-CM

## 2017-12-18 DIAGNOSIS — M5416 Radiculopathy, lumbar region: Secondary | ICD-10-CM

## 2017-12-18 DIAGNOSIS — M6281 Muscle weakness (generalized): Secondary | ICD-10-CM

## 2017-12-18 DIAGNOSIS — M545 Low back pain, unspecified: Secondary | ICD-10-CM

## 2017-12-18 NOTE — Therapy (Addendum)
[place], [name], [address] Phone: [phone]   Fax:  [phone]  Physical Therapy Treatment  Patient Details  Name: Glenn Robertson MRN: [id number] Date of Birth: [birth year] Referring Provider: Leandrew Koyanagi, MD   Encounter Date: 12/18/2017  PT End of Session - 12/18/17 1154    Visit Number  4    Number of Visits  9    Date for PT Re-Evaluation  01/05/18    Authorization Type  MCARE   PN at visit 10   KX at visit 15    PT Start Time  1102    PT Stop Time  1143    PT Time Calculation (min)  41 min    Activity Tolerance  Patient tolerated treatment well    Behavior During Therapy  [facility] for tasks assessed/performed       Past Medical History:  Diagnosis Date  . Allergy   . Asthma   . Chronic pain   . Depression   . Diabetes mellitus   . Neuromuscular disorder [facility])     Past Surgical History:  Procedure Laterality Date  . Arm Surgery  1/12   rt ulnar nerve decompression  . EPIDURAL BLOCK INJECTION     several  . ULNAR NERVE TRANSPOSITION  01/19/2012   Procedure: ULNAR NERVE DECOMPRESSION/TRANSPOSITION;  Surgeon: Cammie Sickle., MD;  Location: [place];  Service: Orthopedics;  Laterality: Left;  ulnar nerve decompression vs transposition left elbow    There were no vitals filed for this visit.  Subjective Assessment - 12/18/17 1106    Subjective  Patient states that he is not experiencing any pain today but  has noticed pain on his right side that goes down into his right glute. Patient has still been seeing a trainer and does work on the [place].     Patient Stated Goals  return to running, biking    Currently in Pain?  No/denies                       [facility] Adult PT Treatment/Exercise - 12/18/17 0001      Lumbar Exercises: Stretches   Piriformis Stretch  3 reps;20 seconds;Right      Lumbar Exercises: Aerobic   Stationary Bike  6 mins       Lumbar  Exercises: Machines for Strengthening   Leg Press  100lbs 3x10      Lumbar Exercises: Supine   Bridge Limitations  2x10 with core contraction       Lumbar Exercises: Sidelying   Hip Abduction  Both;20 reps cues to keep leg back to target glutes       Lumbar Exercises: Quadruped   Plank  --    Other Quadruped Lumbar Exercises  bird dog    Other Quadruped Lumbar Exercises  primal push up      Manual Therapy   Manual therapy comments  IASTM; LAD; right                   PT Long Term Goals - 12/08/17 1148      PT LONG TERM GOAL #1   Title  Pt will be able to demonstrate proper use of weight machines with correct form to protect low back    Baseline  will educate as appropriate    Time  4    Period  Weeks    Status  New    Target Date  01/05/18      PT LONG TERM GOAL #2   Title  Pt will be able to power walk without pain in low back/hip in beginning time    Baseline  pain in first 10th of the mile or so reported at eval    Time  4    Period  Weeks    Status  New    Target Date  01/05/18      PT LONG TERM GOAL #3   Title  Pt will be able to jog lightly without increased pain    Baseline  unable due to pain at eval    Time  4    Period  Weeks    Status  New    Target Date  01/05/18      PT LONG TERM GOAL #4   Title  FOTO to 38% limited    Baseline  45% limited at eval    Time  4    Period  Weeks    Status  New    Target Date  01/05/18      PT LONG TERM GOAL #5   Title  Pt will be able to lift computers from floor with proper form/core contraction to protect low back    Baseline  pain at eval    Time  4    Period  Weeks    Status  New    Target Date  01/05/18            Plan - 12/18/17 1220    Clinical Impression Statement  Patient described tightness in his right lower back that goes down into his right gluteal. Using skilled palpation therapy found trigger points in that region and proceeded with IASTM. Patient tolerate manual treatment well.  Therapy reviewed mat exercises with core contraction cues.     Clinical Presentation  Stable    Clinical Decision Making  Low    PT Frequency  2x / week    PT Duration  4 weeks    PT Treatment/Interventions  ADLs/Self Care Home Management;Cryotherapy;Electrical Stimulation;Ultrasound;Traction;Moist Heat;Therapeutic activities;Therapeutic exercise;Patient/family education;Manual techniques;Passive range of motion;Taping;Dry needling    PT Next Visit Plan  DN to Rt low back/hip musculature PRN, core training- floor work and weight machines   review planks     PT Home Exercise Plan  planks and TT holds with correct form,   machines for gym and wall sit, qped bird dog & fire hydrant, hundreds, primal push up    Consulted and Agree with Plan of Care  Patient       Patient will benefit from skilled therapeutic intervention in order to improve the following deficits and impairments:  Pain, Improper body mechanics, Postural dysfunction, Increased muscle spasms, Decreased activity tolerance  Visit Diagnosis: Chronic right-sided low back pain without sciatica  Acute right-sided low back pain without sciatica  Muscle weakness (generalized)  Lumbar radiculitis     Problem List Patient Active Problem List   Diagnosis Date Noted  . Left wrist pain 07/12/2016  . Lumbosacral radiculopathy at S1 03/15/2016  . Lumbar degenerative disc disease 12/18/2015  . Subacromial impingement of left shoulder 10/26/2015  . Left lumbar radiculitis 04/21/2015  . Disc displacement, lumbar 04/21/2015  . Chondromalacia of both patellae 02/24/2015  . Paranoid schizophrenia, chronic condition ([place]) 02/24/2015  . Low back pain 07/18/2014  . Knee pain, acute 07/18/2014  . Right ankle pain 01/27/2014  . Pain in joint, ankle and foot 01/27/2014  . Sciatica 01/10/2012  .  Thoracic or lumbosacral neuritis or radiculitis, unspecified 12/15/2011  . Disturbance of skin sensation 11/14/2011   Carolyne Littles PT  DPT 12/18/2017   Cooper Render SPT  12/18/2017, 12:23 PM    During this treatment session, the therapist was present, participating in and directing the treatment.  [place], [name], [address] Phone: [phone]   Fax:  [phone]  Name: Glenn Robertson MRN: [id number] Date of Birth: 18-May-1966

## 2017-12-19 ENCOUNTER — Ambulatory Visit: Payer: Medicare Other | Admitting: Physical Therapy

## 2017-12-21 ENCOUNTER — Encounter: Payer: Self-pay | Admitting: Physical Therapy

## 2017-12-21 DIAGNOSIS — E119 Type 2 diabetes mellitus without complications: Secondary | ICD-10-CM | POA: Diagnosis not present

## 2017-12-21 DIAGNOSIS — H401231 Low-tension glaucoma, bilateral, mild stage: Secondary | ICD-10-CM | POA: Diagnosis not present

## 2017-12-21 DIAGNOSIS — H43813 Vitreous degeneration, bilateral: Secondary | ICD-10-CM | POA: Diagnosis not present

## 2017-12-22 ENCOUNTER — Encounter

## 2017-12-22 DIAGNOSIS — F410 Panic disorder [episodic paroxysmal anxiety] without agoraphobia: Secondary | ICD-10-CM | POA: Diagnosis not present

## 2017-12-22 DIAGNOSIS — Z79899 Other long term (current) drug therapy: Secondary | ICD-10-CM | POA: Diagnosis not present

## 2017-12-25 ENCOUNTER — Ambulatory Visit: Payer: Medicare Other | Admitting: Physical Therapy

## 2017-12-25 ENCOUNTER — Encounter: Payer: Self-pay | Admitting: Physical Therapy

## 2017-12-25 DIAGNOSIS — G8929 Other chronic pain: Secondary | ICD-10-CM

## 2017-12-25 DIAGNOSIS — M5416 Radiculopathy, lumbar region: Secondary | ICD-10-CM | POA: Diagnosis not present

## 2017-12-25 DIAGNOSIS — M545 Low back pain, unspecified: Secondary | ICD-10-CM

## 2017-12-25 DIAGNOSIS — M6281 Muscle weakness (generalized): Secondary | ICD-10-CM | POA: Diagnosis not present

## 2017-12-25 NOTE — Therapy (Signed)
[place], [name], [address] Phone: [phone]   Fax:  [phone]  Physical Therapy Treatment  Patient Details  Name: Glenn Robertson MRN: [id number] Date of Birth: 07/12/1966 Referring Provider: Leandrew Koyanagi, MD   Encounter Date: 12/25/2017  PT End of Session - 12/25/17 1506    Visit Number  5    Number of Visits  9    Date for PT Re-Evaluation  01/05/18    Authorization Type  MCARE   PN at visit 10   KX at visit 15    PT Start Time  1500    PT Stop Time  1543    PT Time Calculation (min)  43 min    Activity Tolerance  Patient tolerated treatment well    Behavior During Therapy  [facility] for tasks assessed/performed       Past Medical History:  Diagnosis Date  . Allergy   . Asthma   . Chronic pain   . Depression   . Diabetes mellitus   . Neuromuscular disorder [facility])     Past Surgical History:  Procedure Laterality Date  . Arm Surgery  1/12   rt ulnar nerve decompression  . EPIDURAL BLOCK INJECTION     several  . ULNAR NERVE TRANSPOSITION  01/19/2012   Procedure: ULNAR NERVE DECOMPRESSION/TRANSPOSITION;  Surgeon: Cammie Sickle., MD;  Location: [place];  Service: Orthopedics;  Laterality: Left;  ulnar nerve decompression vs transposition left elbow    There were no vitals filed for this visit.  Subjective Assessment - 12/25/17 1504    Subjective  Patient states that he noticed an increase in right hip pain this morning after working out at the gym. Patient reports his right lower back pain is going into his hip and is at a 3/10.     Patient Stated Goals  return to running, biking    Currently in Pain?  Yes    Pain Score  3     Pain Location  Back    Pain Orientation  Right;Lower    Pain Descriptors / Indicators  Dull    Pain Frequency  Intermittent    Aggravating Factors   bridge pressing from Rt leg; running                        [facility] Adult PT  Treatment/Exercise - 12/25/17 0001      Lumbar Exercises: Stretches   Active Hamstring Stretch  3 reps;20 seconds    Piriformis Stretch  3 reps;20 seconds;Right      Lumbar Exercises: Aerobic   Stationary Bike  L3 x 6 mins       Lumbar Exercises: Machines for Strengthening   Leg Press  40lbs 3x10      Lumbar Exercises: Standing   Other Standing Lumbar Exercises  Rows 2x10 red; shoulder extension 2x10 red        Lumbar Exercises: Supine   Clam  20 reps;Limitations    Clam Limitations  green     Bridge Limitations  2x10 with core contraction     Other Supine Lumbar Exercises  Hamstring curl  2x10       Lumbar Exercises: Quadruped   Other Quadruped Lumbar Exercises  bird dog             PT Education - 12/25/17 1550    Education provided  Yes    Education Details  Importance of TrA breathing  for core stabilizaiton during exercises    Person(s) Educated  Patient    Methods  Explanation;Demonstration;Tactile cues;Verbal cues    Comprehension  Verbalized understanding;Returned demonstration;Need further instruction          PT Long Term Goals - 12/25/17 1608      PT LONG TERM GOAL #1   Title  Pt will be able to demonstrate proper use of weight machines with correct form to protect low back    Baseline  continues to educate as tolerated     Time  4    Period  Weeks    Status  On-going      PT LONG TERM GOAL #2   Title  Pt will be able to power walk without pain in low back/hip in beginning time    Baseline  continues to have pain     Time  4    Period  Weeks    Status  On-going      PT LONG TERM GOAL #3   Title  Pt will be able to jog lightly without increased pain    Baseline  unable due to pain at eval    Time  4    Period  Weeks    Status  On-going      PT LONG TERM GOAL #4   Title  FOTO to 38% limited    Baseline  not assessed     Time  4    Period  Weeks    Status  On-going      PT LONG TERM GOAL #5   Title  Pt will be able to lift computers  from floor with proper form/core contraction to protect low back    Baseline  pain at eval    Time  4    Period  Weeks    Status  On-going            Plan - 12/25/17 1552    Clinical Impression Statement  Patient reports mild pain in his right hip after working out at the gym this morning. Patient suspects that he overdid it on the hamstring machine. Therapy continued with core stabilization exercises and hip exercises to patients tolerance. Therapy added in standing postural exercises with TrA cuing for core stability.    Clinical Presentation  Stable    Clinical Decision Making  Low    Rehab Potential  Good    PT Frequency  2x / week    PT Duration  4 weeks    PT Treatment/Interventions  ADLs/Self Care Home Management;Cryotherapy;Electrical Stimulation;Ultrasound;Traction;Moist Heat;Therapeutic activities;Therapeutic exercise;Patient/family education;Manual techniques;Passive range of motion;Taping;Dry needling    PT Next Visit Plan  DN to Rt low back/hip musculature PRN, core training- floor work and weight machines   review planks     PT Home Exercise Plan  planks and TT holds with correct form,   machines for gym and wall sit, qped bird dog & fire hydrant, hundreds, primal push up    Consulted and Agree with Plan of Care  Patient       Patient will benefit from skilled therapeutic intervention in order to improve the following deficits and impairments:  Pain, Improper body mechanics, Postural dysfunction, Increased muscle spasms, Decreased activity tolerance  Visit Diagnosis: Chronic right-sided low back pain without sciatica  Acute right-sided low back pain without sciatica  Muscle weakness (generalized)     Problem List Patient Active Problem List   Diagnosis Date Noted  . Left wrist pain 07/12/2016  .  Lumbosacral radiculopathy at S1 03/15/2016  . Lumbar degenerative disc disease 12/18/2015  . Subacromial impingement of left shoulder 10/26/2015  . Left lumbar  radiculitis 04/21/2015  . Disc displacement, lumbar 04/21/2015  . Chondromalacia of both patellae 02/24/2015  . Paranoid schizophrenia, chronic condition ([place]) 02/24/2015  . Low back pain 07/18/2014  . Knee pain, acute 07/18/2014  . Right ankle pain 01/27/2014  . Pain in joint, ankle and foot 01/27/2014  . Sciatica 01/10/2012  . Thoracic or lumbosacral neuritis or radiculitis, unspecified 12/15/2011  . Disturbance of skin sensation 11/14/2011    Carney Living  PT DPT  12/25/2017, 4:09 PM   Cooper Render SPT  12/25/2017   During this treatment session, the therapist was present, participating in and directing the treatment.   [place], [name], [address] Phone: [phone]   Fax:  [phone]  Name: Donyale Berthold MRN: [id number] Date of Birth: 14-Jan-1966

## 2017-12-25 NOTE — Therapy (Signed)
[place], [name], [address] Phone: [phone]   Fax:  [phone]  Physical Therapy Treatment  Patient Details  Name: Glenn Robertson MRN: [id number] Date of Birth: Nov 10, 1965 Referring Provider: Leandrew Koyanagi, MD   Encounter Date: 12/25/2017  PT End of Session - 12/25/17 1506    Visit Number  5    Number of Visits  9    Date for PT Re-Evaluation  01/05/18    Authorization Type  [employer]   PN at visit 10   KX at visit 15    PT Start Time  1500    PT Stop Time  1543    PT Time Calculation (min)  43 min    Activity Tolerance  Patient tolerated treatment well    Behavior During Therapy  [facility] for tasks assessed/performed       Past Medical History:  Diagnosis Date  . Allergy   . Asthma   . Chronic pain   . Depression   . Diabetes mellitus   . Neuromuscular disorder [facility])     Past Surgical History:  Procedure Laterality Date  . Arm Surgery  1/12   rt ulnar nerve decompression  . EPIDURAL BLOCK INJECTION     several  . ULNAR NERVE TRANSPOSITION  01/19/2012   Procedure: ULNAR NERVE DECOMPRESSION/TRANSPOSITION;  Surgeon: Cammie Sickle., MD;  Location: [place];  Service: Orthopedics;  Laterality: Left;  ulnar nerve decompression vs transposition left elbow    There were no vitals filed for this visit.  Subjective Assessment - 12/25/17 1504    Subjective  Patient states that he noticed an increase in right hip pain this morning after working out at the gym. Patient reports his right lower back pain is going into his hip and is at a 3/10.     Patient Stated Goals  return to running, biking    Currently in Pain?  Yes    Pain Score  3     Pain Location  Back    Pain Orientation  Right;Lower    Pain Descriptors / Indicators  Dull    Pain Frequency  Intermittent    Aggravating Factors   bridge pressing from Rt leg; running                        [facility] Adult PT  Treatment/Exercise - 12/25/17 0001      Lumbar Exercises: Stretches   Active Hamstring Stretch  3 reps;20 seconds    Piriformis Stretch  3 reps;20 seconds;Right      Lumbar Exercises: Aerobic   Stationary Bike  L3 x 6 mins       Lumbar Exercises: Machines for Strengthening   Leg Press  40lbs 3x10      Lumbar Exercises: Standing   Other Standing Lumbar Exercises  Rows 2x10 red; shoulder extension 2x10 red        Lumbar Exercises: Supine   Clam  20 reps;Limitations    Clam Limitations  green     Bridge Limitations  2x10 with core contraction     Other Supine Lumbar Exercises  Hamstring curl  2x10       Lumbar Exercises: Quadruped   Other Quadruped Lumbar Exercises  bird dog             PT Education - 12/25/17 1550    Education provided  Yes    Education Details  Importance of TrA breathing  for core stabilizaiton during exercises    Person(s) Educated  Patient    Methods  Explanation;Demonstration;Tactile cues;Verbal cues    Comprehension  Verbalized understanding;Returned demonstration;Need further instruction          PT Long Term Goals - 12/08/17 1148      PT LONG TERM GOAL #1   Title  Pt will be able to demonstrate proper use of weight machines with correct form to protect low back    Baseline  will educate as appropriate    Time  4    Period  Weeks    Status  New    Target Date  01/05/18      PT LONG TERM GOAL #2   Title  Pt will be able to power walk without pain in low back/hip in beginning time    Baseline  pain in first 10th of the mile or so reported at eval    Time  4    Period  Weeks    Status  New    Target Date  01/05/18      PT LONG TERM GOAL #3   Title  Pt will be able to jog lightly without increased pain    Baseline  unable due to pain at eval    Time  4    Period  Weeks    Status  New    Target Date  01/05/18      PT LONG TERM GOAL #4   Title  FOTO to 38% limited    Baseline  45% limited at eval    Time  4    Period  Weeks     Status  New    Target Date  01/05/18      PT LONG TERM GOAL #5   Title  Pt will be able to lift computers from floor with proper form/core contraction to protect low back    Baseline  pain at eval    Time  4    Period  Weeks    Status  New    Target Date  01/05/18            Plan - 12/25/17 1552    Clinical Impression Statement  Patient reports mild pain in his right hip after working out at the gym this morning. Patient suspects that he overdid it on the hamstring machine. Therapy continued with core stabilization exercises and hip exercises to patients tolerance. Therapy added in standing postural exercises with TrA cuing for core stability.    Clinical Presentation  Stable    Clinical Decision Making  Low    Rehab Potential  Good    PT Frequency  2x / week    PT Duration  4 weeks    PT Treatment/Interventions  ADLs/Self Care Home Management;Cryotherapy;Electrical Stimulation;Ultrasound;Traction;Moist Heat;Therapeutic activities;Therapeutic exercise;Patient/family education;Manual techniques;Passive range of motion;Taping;Dry needling    PT Next Visit Plan  DN to Rt low back/hip musculature PRN, core training- floor work and weight machines   review planks     PT Home Exercise Plan  planks and TT holds with correct form,   machines for gym and wall sit, qped bird dog & fire hydrant, hundreds, primal push up    Consulted and Agree with Plan of Care  Patient       Patient will benefit from skilled therapeutic intervention in order to improve the following deficits and impairments:  Pain, Improper body mechanics, Postural dysfunction, Increased muscle spasms, Decreased activity tolerance  Visit  Diagnosis: Chronic right-sided low back pain without sciatica  Acute right-sided low back pain without sciatica  Muscle weakness (generalized)     Problem List Patient Active Problem List   Diagnosis Date Noted  . Left wrist pain 07/12/2016  . Lumbosacral radiculopathy at S1  03/15/2016  . Lumbar degenerative disc disease 12/18/2015  . Subacromial impingement of left shoulder 10/26/2015  . Left lumbar radiculitis 04/21/2015  . Disc displacement, lumbar 04/21/2015  . Chondromalacia of both patellae 02/24/2015  . Paranoid schizophrenia, chronic condition ([place]) 02/24/2015  . Low back pain 07/18/2014  . Knee pain, acute 07/18/2014  . Right ankle pain 01/27/2014  . Pain in joint, ankle and foot 01/27/2014  . Sciatica 01/10/2012  . Thoracic or lumbosacral neuritis or radiculitis, unspecified 12/15/2011  . Disturbance of skin sensation 11/14/2011    Cooper Render 12/25/2017, 3:56 PM  [facility] [address] [place], [name], [address] Phone: [phone]   Fax:  [phone]  Name: Glenn Robertson MRN: [id number] Date of Birth: Apr 22, 1966

## 2017-12-27 ENCOUNTER — Ambulatory Visit: Payer: Medicare Other | Attending: Orthopaedic Surgery | Admitting: Physical Therapy

## 2017-12-27 ENCOUNTER — Encounter: Payer: Self-pay | Admitting: Physical Therapy

## 2017-12-27 DIAGNOSIS — M6281 Muscle weakness (generalized): Secondary | ICD-10-CM | POA: Diagnosis not present

## 2017-12-27 DIAGNOSIS — G8929 Other chronic pain: Secondary | ICD-10-CM | POA: Diagnosis not present

## 2017-12-27 DIAGNOSIS — M545 Low back pain, unspecified: Secondary | ICD-10-CM

## 2017-12-27 NOTE — Therapy (Signed)
[place], [name], [address] Phone: [phone]   Fax:  [phone]  Physical Therapy Treatment  Patient Details  Name: Glenn Robertson MRN: [id number] Date of Birth: Apr 02, 1966 Referring Provider: Leandrew Koyanagi, MD   Encounter Date: 12/27/2017  PT End of Session - 12/27/17 1002    Visit Number  6    Number of Visits  9    Date for PT Re-Evaluation  01/05/18    Authorization Type  MCARE   PN at visit 10   KX at visit 15    PT Start Time  1000    PT Stop Time  1044    PT Time Calculation (min)  44 min    Activity Tolerance  Patient tolerated treatment well    Behavior During Therapy  [facility] for tasks assessed/performed       Past Medical History:  Diagnosis Date  . Allergy   . Asthma   . Chronic pain   . Depression   . Diabetes mellitus   . Neuromuscular disorder [facility])     Past Surgical History:  Procedure Laterality Date  . Arm Surgery  1/12   rt ulnar nerve decompression  . EPIDURAL BLOCK INJECTION     several  . ULNAR NERVE TRANSPOSITION  01/19/2012   Procedure: ULNAR NERVE DECOMPRESSION/TRANSPOSITION;  Surgeon: Cammie Sickle., MD;  Location: [place];  Service: Orthopedics;  Laterality: Left;  ulnar nerve decompression vs transposition left elbow    There were no vitals filed for this visit.  Subjective Assessment - 12/27/17 1000    Subjective  Patient states that he noticed the pain in his right hip this morning when he was going a light jog. Patient thinks that his ITB is what is causing the problem. Patient states that he is currently having no pain in his back or hip.    Patient Stated Goals  return to running, biking    Currently in Pain?  No/denies                       [facility] Adult PT Treatment/Exercise - 12/27/17 0001      Lumbar Exercises: Stretches   Active Hamstring Stretch  3 reps;20 seconds    Piriformis Stretch  --    Other Lumbar Stretch  Exercise  ITB stretch 3 reps; 20 seconds       Lumbar Exercises: Aerobic   Stationary Bike  L3 x 6 mins       Lumbar Exercises: Machines for Strengthening   Leg Press  60lbs 3x10      Lumbar Exercises: Standing   Other Standing Lumbar Exercises  3-way hip; bilateral; 2x10      Lumbar Exercises: Supine   Bridge Limitations  2x10 on physioball      Other Supine Lumbar Exercises  Hamstring curl w/ physioball  2x10       Lumbar Exercises: Quadruped   Other Quadruped Lumbar Exercises  bird dog             PT Education - 12/27/17 1051    Education provided  Yes    Education Details  Discussed trigger poing referral patterns; ITB stretch     Person(s) Educated  Patient    Methods  Explanation;Demonstration;Tactile cues;Verbal cues    Comprehension  Verbalized understanding;Need further instruction;Returned demonstration          PT Long Term Goals - 12/25/17 [phone]  PT LONG TERM GOAL #1   Title  Pt will be able to demonstrate proper use of weight machines with correct form to protect low back    Baseline  continues to educate as tolerated     Time  4    Period  Weeks    Status  On-going      PT LONG TERM GOAL #2   Title  Pt will be able to power walk without pain in low back/hip in beginning time    Baseline  continues to have pain     Time  4    Period  Weeks    Status  On-going      PT LONG TERM GOAL #3   Title  Pt will be able to jog lightly without increased pain    Baseline  unable due to pain at eval    Time  4    Period  Weeks    Status  On-going      PT LONG TERM GOAL #4   Title  FOTO to 38% limited    Baseline  not assessed     Time  4    Period  Weeks    Status  On-going      PT LONG TERM GOAL #5   Title  Pt will be able to lift computers from floor with proper form/core contraction to protect low back    Baseline  pain at eval    Time  4    Period  Weeks    Status  On-going            Plan - 12/27/17 1053    Clinical Impression  Statement  Patient felt tightness in the right side of the hip. Therapy perfromed the Ober Test and found it positive for ITB tightness. With skill palpation trigger points were found in the lateral hamstring, ITB, and lateral quad. IASTM was use to address the trigger points. Patient tolerated maunal treatment well. Therapy also added standing hip exercises.     Clinical Presentation  Stable    Clinical Decision Making  Low    Rehab Potential  Good    PT Frequency  2x / week    PT Duration  4 weeks    PT Treatment/Interventions  ADLs/Self Care Home Management;Cryotherapy;Electrical Stimulation;Ultrasound;Traction;Moist Heat;Therapeutic activities;Therapeutic exercise;Patient/family education;Manual techniques;Passive range of motion;Taping;Dry needling    PT Next Visit Plan  DN to Rt low back/hip musculature PRN, core training- floor work and weight machines   review planks     PT Home Exercise Plan  planks and TT holds with correct form,   machines for gym and wall sit, qped bird dog & fire hydrant, hundreds, primal push up    Consulted and Agree with Plan of Care  Patient       Patient will benefit from skilled therapeutic intervention in order to improve the following deficits and impairments:  Pain, Improper body mechanics, Postural dysfunction, Increased muscle spasms, Decreased activity tolerance  Visit Diagnosis: Chronic right-sided low back pain without sciatica  Acute right-sided low back pain without sciatica  Muscle weakness (generalized)     Problem List Patient Active Problem List   Diagnosis Date Noted  . Left wrist pain 07/12/2016  . Lumbosacral radiculopathy at S1 03/15/2016  . Lumbar degenerative disc disease 12/18/2015  . Subacromial impingement of left shoulder 10/26/2015  . Left lumbar radiculitis 04/21/2015  . Disc displacement, lumbar 04/21/2015  . Chondromalacia of both patellae 02/24/2015  . Paranoid  schizophrenia, chronic condition ([place]) 02/24/2015  . Low  back pain 07/18/2014  . Knee pain, acute 07/18/2014  . Right ankle pain 01/27/2014  . Pain in joint, ankle and foot 01/27/2014  . Sciatica 01/10/2012  . Thoracic or lumbosacral neuritis or radiculitis, unspecified 12/15/2011  . Disturbance of skin sensation 11/14/2011   Cooper Render PT DPT  12/27/2017  Carney Living  PT DPT  12/27/2017, 11:45 AM   During this treatment session, the therapist was present, participating in and directing the treatment.  [place], [name], [address] Phone: [phone]   Fax:  [phone]  Name: Rylie Knierim MRN: [id number] Date of Birth: [birth year]

## 2017-12-28 ENCOUNTER — Ambulatory Visit (INDEPENDENT_AMBULATORY_CARE_PROVIDER_SITE_OTHER): Payer: Medicare Other | Admitting: Psychology

## 2017-12-28 DIAGNOSIS — F200 Paranoid schizophrenia: Secondary | ICD-10-CM | POA: Diagnosis not present

## 2017-12-28 DIAGNOSIS — F331 Major depressive disorder, recurrent, moderate: Secondary | ICD-10-CM | POA: Diagnosis not present

## 2017-12-28 DIAGNOSIS — F411 Generalized anxiety disorder: Secondary | ICD-10-CM | POA: Diagnosis not present

## 2018-01-01 ENCOUNTER — Ambulatory Visit: Payer: Medicare Other | Admitting: Physical Therapy

## 2018-01-01 ENCOUNTER — Encounter: Payer: Self-pay | Admitting: Physical Therapy

## 2018-01-01 DIAGNOSIS — M545 Low back pain, unspecified: Secondary | ICD-10-CM

## 2018-01-01 DIAGNOSIS — G8929 Other chronic pain: Secondary | ICD-10-CM

## 2018-01-01 DIAGNOSIS — M6281 Muscle weakness (generalized): Secondary | ICD-10-CM | POA: Diagnosis not present

## 2018-01-01 NOTE — Therapy (Addendum)
[name], [name], [address] Phone: [phone]   Fax:  [phone]  Physical Therapy Treatment  Patient Details  Name: Glenn Robertson MRN: [id number] Date of Birth: [birth year] Referring Provider: Leandrew Koyanagi, MD   Encounter Date: 01/01/2018  PT End of Session - 01/01/18 1021    Visit Number  7    Number of Visits  9    Date for PT Re-Evaluation  01/05/18    Authorization Type  MCARE   PN at visit 10   KX at visit 15    PT Start Time  1016    PT Stop Time  1100    PT Time Calculation (min)  44 min    Activity Tolerance  Patient tolerated treatment well    Behavior During Therapy  [facility] for tasks assessed/performed       Past Medical History:  Diagnosis Date  . Allergy   . Asthma   . Chronic pain   . Depression   . Diabetes mellitus   . Neuromuscular disorder [facility])     Past Surgical History:  Procedure Laterality Date  . Arm Surgery  1/12   rt ulnar nerve decompression  . EPIDURAL BLOCK INJECTION     several  . ULNAR NERVE TRANSPOSITION  01/19/2012   Procedure: ULNAR NERVE DECOMPRESSION/TRANSPOSITION;  Surgeon: Cammie Sickle., MD;  Location: [place];  Service: Orthopedics;  Laterality: Left;  ulnar nerve decompression vs transposition left elbow    There were no vitals filed for this visit.  Subjective Assessment - 01/01/18 1019    Subjective  Patient reports that he has been doing good and currently has no pain. Patient states that he tested the hamstring machine and that did not seem to cause his hip pain. Patient reports that he walked 4 times like week. He has been doing HEP.     Patient Stated Goals  return to running, biking    Currently in Pain?  No/denies                       [facility] Adult PT Treatment/Exercise - 01/01/18 0001      Lumbar Exercises: Stretches   Active Hamstring Stretch  3 reps;20 seconds    Piriformis Stretch  3 reps;20  seconds;Right    Other Lumbar Stretch Exercise  ITB stretch 3 reps; 20 seconds       Lumbar Exercises: Aerobic   Stationary Bike  L3 x 6 mins       Lumbar Exercises: Machines for Strengthening   Leg Press  60lbs 3x10      Lumbar Exercises: Standing   Other Standing Lumbar Exercises  --      Lumbar Exercises: Supine   Clam  20 reps;Limitations    Clam Limitations  green     Bridge Limitations  2x10 on physioball      Other Supine Lumbar Exercises  SLR x10; 2lb       Lumbar Exercises: Quadruped   Other Quadruped Lumbar Exercises  --      Manual Therapy   Manual therapy comments  IASTM to the right lumbar paraspinals and upper gluteals             PT Education - 01/01/18 1021    Education provided  Yes    Education Details  Reviewed therex technique and importance of deep core strengthening     Person(s) Educated  Patient  Methods  Explanation;Demonstration;Tactile cues;Verbal cues    Comprehension  Verbalized understanding;Returned demonstration;Need further instruction          PT Long Term Goals - 12/25/17 1608      PT LONG TERM GOAL #1   Title  Pt will be able to demonstrate proper use of weight machines with correct form to protect low back    Baseline  continues to educate as tolerated     Time  4    Period  Weeks    Status  On-going      PT LONG TERM GOAL #2   Title  Pt will be able to power walk without pain in low back/hip in beginning time    Baseline  continues to have pain     Time  4    Period  Weeks    Status  On-going      PT LONG TERM GOAL #3   Title  Pt will be able to jog lightly without increased pain    Baseline  unable due to pain at eval    Time  4    Period  Weeks    Status  On-going      PT LONG TERM GOAL #4   Title  FOTO to 38% limited    Baseline  not assessed     Time  4    Period  Weeks    Status  On-going      PT LONG TERM GOAL #5   Title  Pt will be able to lift computers from floor with proper form/core  contraction to protect low back    Baseline  pain at eval    Time  4    Period  Weeks    Status  On-going            Plan - 01/01/18 1023    Clinical Impression Statement  Patient stated that he was having tightness and pain on the right side of his LBP with certain movements. Therapy used IASTM to the area. Therapy continued with strengthening the hips and core in order to relieve patients low back and hip pain. Patient tolerated all treatment well. Will discuss discharge at next visit.     Clinical Presentation  Stable    Clinical Decision Making  Low    Rehab Potential  Good    PT Frequency  2x / week    PT Duration  4 weeks    PT Treatment/Interventions  ADLs/Self Care Home Management;Cryotherapy;Electrical Stimulation;Ultrasound;Traction;Moist Heat;Therapeutic activities;Therapeutic exercise;Patient/family education;Manual techniques;Passive range of motion;Taping;Dry needling    PT Next Visit Plan  DN to Rt low back/hip musculature PRN, core training- floor work and weight machines   review planks     PT Home Exercise Plan  planks and TT holds with correct form,   machines for gym and wall sit, qped bird dog & fire hydrant, hundreds, primal push up    Consulted and Agree with Plan of Care  Patient       Patient will benefit from skilled therapeutic intervention in order to improve the following deficits and impairments:  Pain, Improper body mechanics, Postural dysfunction, Increased muscle spasms, Decreased activity tolerance  Visit Diagnosis: Chronic right-sided low back pain without sciatica  Acute right-sided low back pain without sciatica  Muscle weakness (generalized)     Problem List Patient Active Problem List   Diagnosis Date Noted  . Left wrist pain 07/12/2016  . Lumbosacral radiculopathy at S1 03/15/2016  . Lumbar  degenerative disc disease 12/18/2015  . Subacromial impingement of left shoulder 10/26/2015  . Left lumbar radiculitis 04/21/2015  . Disc  displacement, lumbar 04/21/2015  . Chondromalacia of both patellae 02/24/2015  . Paranoid schizophrenia, chronic condition ([place]) 02/24/2015  . Low back pain 07/18/2014  . Knee pain, acute 07/18/2014  . Right ankle pain 01/27/2014  . Pain in joint, ankle and foot 01/27/2014  . Sciatica 01/10/2012  . Thoracic or lumbosacral neuritis or radiculitis, unspecified 12/15/2011  . Disturbance of skin sensation 11/14/2011   Carolyne Littles PT DPT  01/01/2018 Cooper Render SPT  01/01/2018, 11:10 AM During this treatment session, the therapist was present, participating in and directing the treatment.  [place], [name], [address] Phone: [phone]   Fax:  [phone]  Name: Glenn Robertson MRN: [id number] Date of Birth: 1966/03/31

## 2018-01-03 ENCOUNTER — Ambulatory Visit: Payer: Medicare Other | Admitting: Physical Therapy

## 2018-01-03 ENCOUNTER — Encounter: Payer: Self-pay | Admitting: Physical Therapy

## 2018-01-03 DIAGNOSIS — M545 Low back pain, unspecified: Secondary | ICD-10-CM

## 2018-01-03 DIAGNOSIS — M6281 Muscle weakness (generalized): Secondary | ICD-10-CM | POA: Diagnosis not present

## 2018-01-03 DIAGNOSIS — G8929 Other chronic pain: Secondary | ICD-10-CM

## 2018-01-03 NOTE — Therapy (Deleted)
[place], [name], [address] Phone: [phone]   Fax:  [phone]  Physical Therapy Treatment  Patient Details  Name: Glenn Robertson MRN: [id number] Date of Birth: 1965/09/10 Referring Provider: Leandrew Koyanagi, MD   Encounter Date: 01/03/2018  PT End of Session - 01/03/18 1019    Visit Number  8    Number of Visits  9    Date for PT Re-Evaluation  01/05/18    Authorization Type  MCARE   PN at visit 10   KX at visit 15    PT Start Time  1016    PT Stop Time  1102    PT Time Calculation (min)  46 min    Activity Tolerance  Patient tolerated treatment well    Behavior During Therapy  [facility] for tasks assessed/performed       Past Medical History:  Diagnosis Date  . Allergy   . Asthma   . Chronic pain   . Depression   . Diabetes mellitus   . Neuromuscular disorder [facility])     Past Surgical History:  Procedure Laterality Date  . Arm Surgery  1/12   rt ulnar nerve decompression  . EPIDURAL BLOCK INJECTION     several  . ULNAR NERVE TRANSPOSITION  01/19/2012   Procedure: ULNAR NERVE DECOMPRESSION/TRANSPOSITION;  Surgeon: Cammie Sickle., MD;  Location: [facility];  Service: Orthopedics;  Laterality: Left;  ulnar nerve decompression vs transposition left elbow    There were no vitals filed for this visit.  Subjective Assessment - 01/03/18 1019    Subjective  Patient reports that he is doing well but his foot has been hurting since his 4 mile walk yesterday. Patient states that his back is a little sore today but not painful.    Patient Stated Goals  return to running, biking    Pain Score  2     Pain Location  Back    Pain Orientation  Right;Left    Pain Descriptors / Indicators  Discomfort    Pain Frequency  Intermittent    Aggravating Factors   bridge pressing from Rt leg; running                        [facility] Adult PT Treatment/Exercise - 01/03/18 0001      Self-Care   Self-Care  Lifting    Lifting  reviewed proper lifting technique when moving computers      Lumbar Exercises: Stretches   Active Hamstring Stretch  3 reps;20 seconds    Piriformis Stretch  3 reps;20 seconds;Right    Other Lumbar Stretch Exercise  ITB stretch 3 reps; 20 seconds       Lumbar Exercises: Aerobic   Stationary Bike  L3 x 6 mins       Lumbar Exercises: Machines for Strengthening   Leg Press  80lbs 3x10      Lumbar Exercises: Standing   Functional Squats  20 reps    Other Standing Lumbar Exercises  3-way hip; bilateral; 2x10      Lumbar Exercises: Supine   Clam  20 reps;Limitations    Clam Limitations  blue     Bridge Limitations  2x10 w/ TrA contraction     Other Supine Lumbar Exercises  SLR x10      Lumbar Exercises: Quadruped   Other Quadruped Lumbar Exercises  bird dog  PT Education - 01/03/18 1022    Education provided  Yes    Education Details  reviewed advanced HEP for discharge    Person(s) Educated  Patient    Methods  Explanation;Demonstration;Tactile cues;Verbal cues    Comprehension  Verbalized understanding;Returned demonstration          PT Long Term Goals - 01/03/18 1034      PT LONG TERM GOAL #1   Title  Pt will be able to demonstrate proper use of weight machines with correct form to protect low back    Baseline  patient displays correct form with core contraction when using weight machines     Time  4    Period  Weeks    Status  Achieved    Target Date  01/05/18      PT LONG TERM GOAL #2   Title  Pt will be able to power walk without pain in low back/hip in beginning time    Baseline  pt walking 4 miles     Time  4    Period  Weeks    Status  Achieved    Target Date  01/05/18      PT LONG TERM GOAL #3   Title  Pt will be able to jog lightly without increased pain    Baseline  pain during the inital few minutes and then it disipates     Time  4    Period  Weeks    Status  Partially Met    Target  Date  01/05/18      PT LONG TERM GOAL #4   Title  FOTO to 38% limited    Baseline  37% limitation    Time  4    Period  Weeks    Status  Achieved    Target Date  01/05/18      PT LONG TERM GOAL #5   Title  Pt will be able to lift computers from floor with proper form/core contraction to protect low back    Baseline  patient has not tried lifiting computers from the floor; patient able to perform functional squat with proper core contraction    Time  4    Period  Weeks    Status  Partially Met    Target Date  01/05/18            Plan - 01/03/18 1023    Clinical Impression Statement  Patients bilateral LE strength has improved and he is able to perform a  proper core contraction to protect his back during lifting and gym exercises. Therapy reviewed patients HEP for discharge today. Patient expressed that he was comfortable for discharge and was encouraged to continue his stretches and exercises at home.     Clinical Presentation  Stable    Clinical Decision Making  Low    Rehab Potential  Good    PT Frequency  2x / week    PT Duration  4 weeks    PT Treatment/Interventions  ADLs/Self Care Home Management;Cryotherapy;Electrical Stimulation;Ultrasound;Traction;Moist Heat;Therapeutic activities;Therapeutic exercise;Patient/family education;Manual techniques;Passive range of motion;Taping;Dry needling    PT Next Visit Plan  DN to Rt low back/hip musculature PRN, core training- floor work and weight machines   review planks     PT Home Exercise Plan  planks and TT holds with correct form,   machines for gym and wall sit, qped bird dog & fire hydrant, hundreds, primal push up    Consulted and Agree with Plan  of Care  Patient       Patient will benefit from skilled therapeutic intervention in order to improve the following deficits and impairments:  Pain, Improper body mechanics, Postural dysfunction, Increased muscle spasms, Decreased activity tolerance  Visit Diagnosis: Chronic  right-sided low back pain without sciatica  Acute right-sided low back pain without sciatica  Muscle weakness (generalized)     Problem List Patient Active Problem List   Diagnosis Date Noted  . Left wrist pain 07/12/2016  . Lumbosacral radiculopathy at S1 03/15/2016  . Lumbar degenerative disc disease 12/18/2015  . Subacromial impingement of left shoulder 10/26/2015  . Left lumbar radiculitis 04/21/2015  . Disc displacement, lumbar 04/21/2015  . Chondromalacia of both patellae 02/24/2015  . Paranoid schizophrenia, chronic condition ([place]) 02/24/2015  . Low back pain 07/18/2014  . Knee pain, acute 07/18/2014  . Right ankle pain 01/27/2014  . Pain in joint, ankle and foot 01/27/2014  . Sciatica 01/10/2012  . Thoracic or lumbosacral neuritis or radiculitis, unspecified 12/15/2011  . Disturbance of skin sensation 11/14/2011    Cooper Render 01/03/2018, 12:40 PM  [facility] [address] [place], [name], [address] Phone: [phone]   Fax:  [phone]  Name: Seibert Keeter MRN: [id number] Date of Birth: 1966/08/16

## 2018-01-03 NOTE — Therapy (Cosign Needed Addendum)
[place], [name], [address] Phone: [phone]   Fax:  [phone]  Physical Therapy Treatment/ Discharge   Patient Details  Name: Glenn Robertson MRN: [id number] Date of Birth: Jun 30, 1966 Referring Provider: Leandrew Koyanagi, MD   Encounter Date: 01/03/2018  PT End of Session - 01/03/18 1019    Visit Number  8    Number of Visits  9    Date for PT Re-Evaluation  01/05/18    Authorization Type  MCARE   PN at visit 10   KX at visit 15    PT Start Time  1016    PT Stop Time  1102    PT Time Calculation (min)  46 min    Activity Tolerance  Patient tolerated treatment well    Behavior During Therapy  [facility] for tasks assessed/performed       Past Medical History:  Diagnosis Date  . Allergy   . Asthma   . Chronic pain   . Depression   . Diabetes mellitus   . Neuromuscular disorder [facility])     Past Surgical History:  Procedure Laterality Date  . Arm Surgery  1/12   rt ulnar nerve decompression  . EPIDURAL BLOCK INJECTION     several  . ULNAR NERVE TRANSPOSITION  01/19/2012   Procedure: ULNAR NERVE DECOMPRESSION/TRANSPOSITION;  Surgeon: Cammie Sickle., MD;  Location: [place];  Service: Orthopedics;  Laterality: Left;  ulnar nerve decompression vs transposition left elbow    There were no vitals filed for this visit.  Subjective Assessment - 01/03/18 1019    Subjective  Patient reports that he is doing well but his foot has been hurting since his 4 mile walk yesterday. Patient states that his back is a little sore today but not painful.    Patient Stated Goals  return to running, biking    Pain Score  2     Pain Location  Back    Pain Orientation  Right;Left    Pain Descriptors / Indicators  Discomfort    Pain Frequency  Intermittent    Aggravating Factors   bridge pressing from Rt leg; running                        [facility] Adult PT Treatment/Exercise - 01/03/18 0001       Self-Care   Self-Care  Lifting    Lifting  reviewed proper lifting technique when moving computers      Lumbar Exercises: Stretches   Active Hamstring Stretch  3 reps;20 seconds    Piriformis Stretch  3 reps;20 seconds;Right    Other Lumbar Stretch Exercise  ITB stretch 3 reps; 20 seconds       Lumbar Exercises: Aerobic   Stationary Bike  L3 x 6 mins       Lumbar Exercises: Machines for Strengthening   Leg Press  80lbs 3x10      Lumbar Exercises: Standing   Functional Squats  20 reps    Other Standing Lumbar Exercises  3-way hip; bilateral; 2x10      Lumbar Exercises: Supine   Clam  20 reps;Limitations    Clam Limitations  blue     Bridge Limitations  2x10 w/ TrA contraction     Other Supine Lumbar Exercises  SLR x10      Lumbar Exercises: Quadruped   Other Quadruped Lumbar Exercises  bird dog  PT Education - 01/03/18 1022    Education provided  Yes    Education Details  reviewed advanced HEP for discharge    Person(s) Educated  Patient    Methods  Explanation;Demonstration;Tactile cues;Verbal cues    Comprehension  Verbalized understanding;Returned demonstration          PT Long Term Goals - 01/03/18 1034      PT LONG TERM GOAL #1   Title  Pt will be able to demonstrate proper use of weight machines with correct form to protect low back    Baseline  patient displays correct form with core contraction when using weight machines     Time  4    Period  Weeks    Status  Achieved    Target Date  01/05/18      PT LONG TERM GOAL #2   Title  Pt will be able to power walk without pain in low back/hip in beginning time    Baseline  pt walking 4 miles     Time  4    Period  Weeks    Status  Achieved    Target Date  01/05/18      PT LONG TERM GOAL #3   Title  Pt will be able to jog lightly without increased pain    Baseline  pain during the inital few minutes and then it disipates     Time  4    Period  Weeks    Status  Partially Met     Target Date  01/05/18      PT LONG TERM GOAL #4   Title  FOTO to 38% limited    Baseline  37% limitation    Time  4    Period  Weeks    Status  Achieved    Target Date  01/05/18      PT LONG TERM GOAL #5   Title  Pt will be able to lift computers from floor with proper form/core contraction to protect low back    Baseline  patient has not tried lifiting computers from the floor; patient able to perform functional squat with proper core contraction    Time  4    Period  Weeks    Status  Partially Met    Target Date  01/05/18            Plan - 01/03/18 1023    Clinical Impression Statement  Patients bilateral LE strength has improved and he is able to perform a  proper core contraction to protect his back during lifting and gym exercises. Therapy reviewed patients HEP for discharge today. Patient expressed that he was comfortable for discharge and was encouraged to continue his stretches and exercises at home. The patient has reached max benefit fro therapy. He has been to therapy many times. He has an HEP and he has stretches and things to work on. The problem is that he does other things that are probably too much for him to handle.     Clinical Presentation  Stable    Clinical Decision Making  Low    Rehab Potential  Good    PT Frequency  2x / week    PT Duration  4 weeks    PT Treatment/Interventions  ADLs/Self Care Home Management;Cryotherapy;Electrical Stimulation;Ultrasound;Traction;Moist Heat;Therapeutic activities;Therapeutic exercise;Patient/family education;Manual techniques;Passive range of motion;Taping;Dry needling    PT Next Visit Plan  DN to Rt low back/hip musculature PRN, core training- floor work and weight machines  review planks     PT Home Exercise Plan  planks and TT holds with correct form,   machines for gym and wall sit, qped bird dog & fire hydrant, hundreds, primal push up    Consulted and Agree with Plan of Care  Patient       Patient will benefit  from skilled therapeutic intervention in order to improve the following deficits and impairments:  Pain, Improper body mechanics, Postural dysfunction, Increased muscle spasms, Decreased activity tolerance  Visit Diagnosis: Chronic right-sided low back pain without sciatica  Acute right-sided low back pain without sciatica  Muscle weakness (generalized)  PHYSICAL THERAPY DISCHARGE SUMMARY  Visits from Start of Care: 8   Current functional level related to goals / functional outcomes: Has continued pain when he does too much    Remaining deficits: Continued pain when he does too much activity at the gym   Education / Equipment: HEP   Plan: Patient agrees to discharge.  Patient goals were not met. Patient is being discharged due to meeting the stated rehab goals.  ?????       Problem List Patient Active Problem List   Diagnosis Date Noted  . Left wrist pain 07/12/2016  . Lumbosacral radiculopathy at S1 03/15/2016  . Lumbar degenerative disc disease 12/18/2015  . Subacromial impingement of left shoulder 10/26/2015  . Left lumbar radiculitis 04/21/2015  . Disc displacement, lumbar 04/21/2015  . Chondromalacia of both patellae 02/24/2015  . Paranoid schizophrenia, chronic condition ([facility]) 02/24/2015  . Low back pain 07/18/2014  . Knee pain, acute 07/18/2014  . Right ankle pain 01/27/2014  . Pain in joint, ankle and foot 01/27/2014  . Sciatica 01/10/2012  . Thoracic or lumbosacral neuritis or radiculitis, unspecified 12/15/2011  . Disturbance of skin sensation 11/14/2011     J  PT DPT  01/03/2018, 12:56 PM Nicole Bailey SPT  01/03/2018   During this treatment session, the therapist was present, participating in and directing the treatment.    [facility] [address] , [place], [address] Phone: [phone]   Fax:  [phone]  Name: Kule Corriveau MRN: [id number] Date of Birth: 03/19/1966   

## 2018-01-04 ENCOUNTER — Encounter: Payer: Self-pay | Admitting: Podiatry

## 2018-01-04 ENCOUNTER — Ambulatory Visit (INDEPENDENT_AMBULATORY_CARE_PROVIDER_SITE_OTHER): Payer: Medicare Other | Admitting: Podiatry

## 2018-01-04 DIAGNOSIS — M722 Plantar fascial fibromatosis: Secondary | ICD-10-CM | POA: Diagnosis not present

## 2018-01-04 NOTE — Progress Notes (Signed)
He presents today for follow-up of his capsulitis and plantar fasciitis of his left foot.  States that he is only able to go 4 miles on his walk before his foot started to hurt and he was unable to walk for the next 2 days as he refers to his left foot.  He states that it seems that the orthotics are really starting to bother me rather than help me.  At this point I do not know whether to wear them or not.  Objective: Vital signs stable alert and oriented x3.  Pulses are palpable.  Has no reproducible pain on palpation and evaluation of the dorsal and plantar lateral aspect of the left foot.  He states that he really started only start to bother him after his been walking for an extended period of time.  Assessment: Plantar fasciitis lateral metatarsalgia.  Plan: We will have him follow-up with Glenn Robertson in about 2 weeks for reevaluation and he is going to wear his shoes without his orthotics first to see how he does with his pain and then place his orthotics in the next trip out and wear them to see how that does.  We will follow-up with him at that time.

## 2018-01-05 ENCOUNTER — Telehealth: Payer: Self-pay | Admitting: Podiatry

## 2018-01-05 NOTE — Telephone Encounter (Signed)
Patient wants to know how he has plantar fas in the left side of the left foot- he thought it was in the arch? Also what can be done about it? If you can call him back at [phone]

## 2018-01-05 NOTE — Telephone Encounter (Signed)
I informed pt plantar fasciitis could cover the entire plantar foot, and all the way to across, to treat with OTC antiinflammatories that he was able to tolerate take as directed and ice therapy 3-4 times daily for 15-20 minutes/session protecting skin from the ice with light cloth, supportive shoes and reschedule in 10-14 days if continue to have pain. Pt states understanding.

## 2018-01-08 ENCOUNTER — Ambulatory Visit (INDEPENDENT_AMBULATORY_CARE_PROVIDER_SITE_OTHER): Payer: Medicare Other | Admitting: Orthopaedic Surgery

## 2018-01-08 ENCOUNTER — Encounter (INDEPENDENT_AMBULATORY_CARE_PROVIDER_SITE_OTHER): Payer: Self-pay | Admitting: Orthopaedic Surgery

## 2018-01-08 ENCOUNTER — Telehealth: Payer: Self-pay | Admitting: Podiatry

## 2018-01-08 DIAGNOSIS — M5417 Radiculopathy, lumbosacral region: Secondary | ICD-10-CM

## 2018-01-08 NOTE — Addendum Note (Signed)
Addended by: Precious Bard on: 01/08/2018 08:40 AM   Modules accepted: Orders

## 2018-01-08 NOTE — Telephone Encounter (Signed)
Please let Dr. Milinda Pointer know I was able to walk 4.1 miles without any pain the next day as far as I can tell. I used the Dr. Felicie Morn inserts instead of the orthotics. I'm going to try 4 miles today and see what happens tomorrow. If he has any further instructions, please relay them to me. My number is [phone]. Thank you. Out.

## 2018-01-08 NOTE — Progress Notes (Signed)
Office Visit Note   Patient: Glenn Robertson           Date of Birth: 11/13/1965           MRN: [id number] Visit Date: 01/08/2018              Requested by: Leonard Downing, MD [address] [place], [place] [address] PCP: Leonard Downing, MD   Assessment & Plan: Visit Diagnoses:  1. Lumbosacral radiculopathy at S1     Plan: Patient now complains of some radiculopathy into his right foot.  We will reorder epidural steroid injection with Dr. Ernestina Patches at this point.  Follow-up with me as needed.  Follow-Up Instructions: Return if symptoms worsen or fail to improve.   Orders:  No orders of the defined types were placed in this encounter.  No orders of the defined types were placed in this encounter.     Procedures: No procedures performed   Clinical Data: No additional findings.   Subjective: Chief Complaint  Patient presents with  . Lower Back - Follow-up    Patient follows up today for continued lumbar radiculopathy.  He has not had epidural steroid injection.  He is doing core strengthening and physical therapy and home exercises.   Review of Systems  Constitutional: Negative.   All other systems reviewed and are negative.    Objective: Vital Signs: There were no vitals taken for this visit.  Physical Exam  Constitutional: He is oriented to person, place, and time. He appears well-developed and well-nourished.  Pulmonary/Chest: Effort normal.  Abdominal: Soft.  Neurological: He is alert and oriented to person, place, and time.  Skin: Skin is warm.  Psychiatric: He has a normal mood and affect. His behavior is normal. Judgment and thought content normal.  Nursing note and vitals reviewed.   Ortho Exam Exam stable Specialty Comments:  No specialty comments available.  Imaging: No results found.   PMFS History: Patient Active Problem List   Diagnosis Date Noted  . Left wrist pain 07/12/2016  . Lumbosacral radiculopathy at S1  03/15/2016  . Lumbar degenerative disc disease 12/18/2015  . Subacromial impingement of left shoulder 10/26/2015  . Left lumbar radiculitis 04/21/2015  . Disc displacement, lumbar 04/21/2015  . Chondromalacia of both patellae 02/24/2015  . Paranoid schizophrenia, chronic condition ([place]) 02/24/2015  . Low back pain 07/18/2014  . Knee pain, acute 07/18/2014  . Right ankle pain 01/27/2014  . Pain in joint, ankle and foot 01/27/2014  . Sciatica 01/10/2012  . Thoracic or lumbosacral neuritis or radiculitis, unspecified 12/15/2011  . Disturbance of skin sensation 11/14/2011   Past Medical History:  Diagnosis Date  . Allergy   . Asthma   . Chronic pain   . Depression   . Diabetes mellitus   . Neuromuscular disorder ([place])     Family History  Problem Relation Age of Onset  . Diabetes Father   . Diabetes Paternal Uncle     Past Surgical History:  Procedure Laterality Date  . Arm Surgery  1/12   rt ulnar nerve decompression  . EPIDURAL BLOCK INJECTION     several  . ULNAR NERVE TRANSPOSITION  01/19/2012   Procedure: ULNAR NERVE DECOMPRESSION/TRANSPOSITION;  Surgeon: Cammie Sickle., MD;  Location: [place];  Service: Orthopedics;  Laterality: Left;  ulnar nerve decompression vs transposition left elbow   Social History   Occupational History  . Not on file  Tobacco Use  . Smoking status: Never Smoker  .  Smokeless tobacco: Never Used  Substance and Sexual Activity  . Alcohol use: No  . Drug use: No  . Sexual activity: Not on file

## 2018-01-15 ENCOUNTER — Encounter: Payer: Self-pay | Admitting: Sports Medicine

## 2018-01-15 ENCOUNTER — Ambulatory Visit (INDEPENDENT_AMBULATORY_CARE_PROVIDER_SITE_OTHER): Payer: Medicare Other | Admitting: Sports Medicine

## 2018-01-15 VITALS — BP 130/88 | Ht 70.0 in | Wt 205.0 lb

## 2018-01-15 DIAGNOSIS — S93401A Sprain of unspecified ligament of right ankle, initial encounter: Secondary | ICD-10-CM | POA: Diagnosis not present

## 2018-01-15 NOTE — Progress Notes (Signed)
     Date of Visit: 01/08/2018   HPI:  Glenn Robertson is a 52 y.o. male with a reported history of right ankle inversion injury s/p right ankle arthroscopy in 2015 who presents today with right ankle pain in the setting of known ankle inversion. He was walking on a trail 5 days ago when he rolled his ankle on an exposed tree root. He reports non-radiating pain in the lateral aspect of the ankle around the lateral malleolus and in the inferior achilles region. It is 1-2/10 in severity. He denies any swelling after the event. He reports that he was able to bear weight immediately. The pain has been progressively improving with rest and ice. Landing on the ankle from a height makes it worse, but he denies significant pain with walking. He denies any ecchymosis or other skin changes. He denies numbness or tingling.   ROS: See HPI.  [place]: Medical history was reviewed with patient. History of prior right ankle trauma s/p arthroscopy in 2015; precise injury unknown.  PHYSICAL EXAM: BP 108/74   Ht 5\' 7"  (1.702 m)   Wt 157 lb (71.2 kg)   BMI 24.59 kg/m  Gen: well-developed, well-nourished. NAD.  Heart: warm, well-perfused.  Lungs: non-labored breathing. Neuro: awake, alert and oriented x3.  Right Ankle: No deformity. No ecchymosis or swelling. Mild TTP in region inferior to the lateral malleolus. No TTP at posterior tip of lateral malleolus, medial malleolus, navicular, or base of 5th metacarpal. No TTP at proximal fibula or along the Achilles. Full ROM all planes; no pain with resisted motion. Strength 5/5 in all planes. Mildly increased laxity on talar tilt, though finding was symmetric with left ankle. No laxity with anterior drawer. NVI distally.  Left Ankle: No deformity. FROM with 5/5 strength. No tenderness to palpation. Increased laxity with talar tilt, symmetric with right ankle. NVI distally.   ASSESSMENT/PLAN: Right ankle pain: Patient presents 5 days after reported inversion injury  to right ankle. Clinical suspicion for ligamentous tear or fracture is low. He was able to bear weight immediately and in the office today. No TTP to support need for radiographs at this time. He has a pair of ankle braces, but is unable to fit them in his trail running shoes. He will try an ASO ankle brace and rehab exercises at home. He may continue activities as tolerated. Follow-up as needed.   FOLLOW UP: Follow up as needed if symptoms worsen or fail to improve.   Reinaldo Raddle, Medical Student  Patient seen and evaluated with the medical student. I agree with the above plan of care. Patient was given a home exercise program for his ankle sprain. Continue with activity as tolerated and follow-up for ongoing or recalcitrant issues.

## 2018-01-18 ENCOUNTER — Ambulatory Visit: Payer: Medicare Other | Admitting: Orthotics

## 2018-01-18 DIAGNOSIS — M778 Other enthesopathies, not elsewhere classified: Secondary | ICD-10-CM

## 2018-01-18 DIAGNOSIS — M779 Enthesopathy, unspecified: Principal | ICD-10-CM

## 2018-01-18 NOTE — Progress Notes (Signed)
Adding modifications to LEFT foot orthotic recv ed in October:  Valgus post and 5th met head cut out shell to take pressure off lateral border.

## 2018-01-25 ENCOUNTER — Ambulatory Visit (INDEPENDENT_AMBULATORY_CARE_PROVIDER_SITE_OTHER): Payer: Medicare Other | Admitting: Psychology

## 2018-01-25 ENCOUNTER — Ambulatory Visit (INDEPENDENT_AMBULATORY_CARE_PROVIDER_SITE_OTHER): Payer: Medicare Other | Admitting: Orthopedic Surgery

## 2018-01-25 ENCOUNTER — Ambulatory Visit: Payer: Medicare Other | Admitting: Podiatry

## 2018-01-25 DIAGNOSIS — F200 Paranoid schizophrenia: Secondary | ICD-10-CM

## 2018-01-25 DIAGNOSIS — F4310 Post-traumatic stress disorder, unspecified: Secondary | ICD-10-CM | POA: Diagnosis not present

## 2018-01-25 DIAGNOSIS — F411 Generalized anxiety disorder: Secondary | ICD-10-CM | POA: Diagnosis not present

## 2018-01-25 DIAGNOSIS — F331 Major depressive disorder, recurrent, moderate: Secondary | ICD-10-CM | POA: Diagnosis not present

## 2018-01-30 ENCOUNTER — Ambulatory Visit (INDEPENDENT_AMBULATORY_CARE_PROVIDER_SITE_OTHER): Payer: Medicare Other | Admitting: Orthopedic Surgery

## 2018-02-01 ENCOUNTER — Ambulatory Visit: Payer: Medicare Other | Admitting: Orthotics

## 2018-02-01 DIAGNOSIS — M778 Other enthesopathies, not elsewhere classified: Secondary | ICD-10-CM

## 2018-02-01 DIAGNOSIS — M7741 Metatarsalgia, right foot: Secondary | ICD-10-CM

## 2018-02-01 DIAGNOSIS — M779 Enthesopathy, unspecified: Principal | ICD-10-CM

## 2018-02-01 NOTE — Progress Notes (Signed)
Patient came in today to pick up custom made foot orthotics.  The goals were accomplished and the patient reported no dissatisfaction with said orthotics.  Patient was advised of breakin period and how to report any issues. 

## 2018-02-05 ENCOUNTER — Encounter (INDEPENDENT_AMBULATORY_CARE_PROVIDER_SITE_OTHER): Payer: Self-pay | Admitting: Physical Medicine and Rehabilitation

## 2018-02-07 ENCOUNTER — Encounter (INDEPENDENT_AMBULATORY_CARE_PROVIDER_SITE_OTHER): Payer: Self-pay | Admitting: Physical Medicine and Rehabilitation

## 2018-02-15 ENCOUNTER — Ambulatory Visit (INDEPENDENT_AMBULATORY_CARE_PROVIDER_SITE_OTHER): Payer: Medicare Other | Admitting: Psychology

## 2018-02-15 DIAGNOSIS — F331 Major depressive disorder, recurrent, moderate: Secondary | ICD-10-CM

## 2018-02-15 DIAGNOSIS — F200 Paranoid schizophrenia: Secondary | ICD-10-CM | POA: Diagnosis not present

## 2018-02-15 DIAGNOSIS — F411 Generalized anxiety disorder: Secondary | ICD-10-CM | POA: Diagnosis not present

## 2018-02-15 DIAGNOSIS — F4310 Post-traumatic stress disorder, unspecified: Secondary | ICD-10-CM

## 2018-02-16 ENCOUNTER — Ambulatory Visit (INDEPENDENT_AMBULATORY_CARE_PROVIDER_SITE_OTHER): Payer: Medicare Other | Admitting: Family Medicine

## 2018-02-16 ENCOUNTER — Encounter: Payer: Self-pay | Admitting: Family Medicine

## 2018-02-16 VITALS — BP 118/74 | HR 87 | Temp 98.1°F | Ht 70.0 in

## 2018-02-16 DIAGNOSIS — F200 Paranoid schizophrenia: Secondary | ICD-10-CM | POA: Diagnosis not present

## 2018-02-16 DIAGNOSIS — Z0000 Encounter for general adult medical examination without abnormal findings: Secondary | ICD-10-CM | POA: Insufficient documentation

## 2018-02-16 DIAGNOSIS — E538 Deficiency of other specified B group vitamins: Secondary | ICD-10-CM | POA: Diagnosis not present

## 2018-02-16 DIAGNOSIS — Z125 Encounter for screening for malignant neoplasm of prostate: Secondary | ICD-10-CM | POA: Insufficient documentation

## 2018-02-16 DIAGNOSIS — E119 Type 2 diabetes mellitus without complications: Secondary | ICD-10-CM | POA: Insufficient documentation

## 2018-02-16 DIAGNOSIS — Z1211 Encounter for screening for malignant neoplasm of colon: Secondary | ICD-10-CM

## 2018-02-16 MED ORDER — METFORMIN HCL ER 500 MG PO TB24: 1000.0000 mg | ORAL_TABLET | Freq: Every day | ORAL | 2 refills | Status: DC

## 2018-02-16 NOTE — Assessment & Plan Note (Signed)
Diabetes overall has been well controlled Agreeable to continue metformin, will change to Metformin xr.  Having some hypoglycemia, reduce glipizide to 10mg  (currently taking 20mg ) Will check A1c today Update B12 levels.

## 2018-02-16 NOTE — Assessment & Plan Note (Signed)
Stable, followed by psychiatry for medication management.

## 2018-02-16 NOTE — Assessment & Plan Note (Signed)
Update b12

## 2018-02-16 NOTE — Progress Notes (Signed)
Glenn Robertson - 52 y.o. male MRN [id number]  Date of birth: August 06, 1966  Subjective Chief Complaint  Patient presents with  . Establish Care    would like to discuss medications for DM, doesn't like Metformin, caused issues with his elbows due to its interaction with B12.    HPI Glenn Robertson is a 52 y.o. male with a history T2DM, mood disorder, asthma and glaucoma here today to establish with new pcp.  -DM:  Current treatment with metformin and glipizide.  He is taking medications as directed but is concerned about long term metformin use as he has had issues with low b12.  He taking a b12 supplement.  He reports that his last a1c was around 6.8%.  He does admit to some episodes of hypoglycemia with current medications.  Upon further discussion it seems he is taking glipizide 2 tabs (20mg ) rather than 15mg .   -Mood disorder:  Followed by psychiatry and stable with current medications. Denies side effects with current medications including oversedation.    -Asthma:  Mild intermittent asthma, uses inhaler as needed.  Does also have allergies and takes OTC antihistamine and flonase.  Denies recent shortness of breath, wheezing, night time cough.    ROS:  ROS completed and negative except as noted per HPI.  Allergies  Allergen Reactions  . Citric Acid Other (See Comments)    Runny nose, eyes water  . Food     Oranges or anything with citric acid    Past Medical History:  Diagnosis Date  . Allergy   . Asthma   . Chronic pain   . Depression   . Diabetes mellitus   . Neuromuscular disorder [facility]     Past Surgical History:  Procedure Laterality Date  . Arm Surgery  1/12   rt ulnar nerve decompression  . EPIDURAL BLOCK INJECTION     several  . ULNAR NERVE TRANSPOSITION  01/19/2012   Procedure: ULNAR NERVE DECOMPRESSION/TRANSPOSITION;  Surgeon: Cammie Sickle., MD;  Location: [place];  Service: Orthopedics;  Laterality: Left;  ulnar nerve decompression vs  transposition left elbow    Social History   Socioeconomic History  . Marital status: Single    Spouse name: Not on file  . Number of children: Not on file  . Years of education: Not on file  . Highest education level: Not on file  Occupational History  . Not on file  Social Needs  . Financial resource strain: Not on file  . Food insecurity:    Worry: Not on file    Inability: Not on file  . Transportation needs:    Medical: Not on file    Non-medical: Not on file  Tobacco Use  . Smoking status: Never Smoker  . Smokeless tobacco: Never Used  Substance and Sexual Activity  . Alcohol use: No  . Drug use: No  . Sexual activity: Not on file  Lifestyle  . Physical activity:    Days per week: Not on file    Minutes per session: Not on file  . Stress: Not on file  Relationships  . Social connections:    Talks on phone: Not on file    Gets together: Not on file    Attends religious service: Not on file    Active member of club or organization: Not on file    Attends meetings of clubs or organizations: Not on file    Relationship status: Not on file  Other Topics Concern  .  Not on file  Social History Narrative  . Not on file    Family History  Problem Relation Age of Onset  . Diabetes Father   . Diabetes Paternal Uncle     Health Maintenance  Topic Date Due  . HEMOGLOBIN A1C  10-09-1966  . PNEUMOCOCCAL POLYSACCHARIDE VACCINE (1) 09/02/1968  . FOOT EXAM  09/02/1976  . OPHTHALMOLOGY EXAM  09/02/1976  . URINE MICROALBUMIN  09/02/1976  . HIV Screening  09/02/1981  . TETANUS/TDAP  09/02/1985  . COLONOSCOPY  09/02/2016  . INFLUENZA VACCINE  03/29/2018    ----------------------------------------------------------------------------------------------------------------------------------------------------------------------------------------------------------------- Physical Exam BP 118/74 (BP Location: Left Arm, Patient Position: Sitting, Cuff Size: Normal)   Pulse  87   Temp 98.1 F (36.7 C) (Oral)   Ht 5\' 10"  (1.778 m)   SpO2 97%   BMI 29.41 kg/m   Physical Exam  Constitutional: He is oriented to person, place, and time. He appears well-nourished. No distress.  HENT:  Head: Normocephalic and atraumatic.  Mouth/Throat: Oropharynx is clear and moist.  Eyes: No scleral icterus.  Neck: Neck supple. No thyromegaly present.  Cardiovascular: Normal rate, regular rhythm and normal heart sounds.  Pulmonary/Chest: Effort normal and breath sounds normal.  Musculoskeletal: He exhibits no edema.  Lymphadenopathy:    He has no cervical adenopathy.  Neurological: He is alert and oriented to person, place, and time. No cranial nerve deficit.  Psychiatric:  Flattened affect     ------------------------------------------------------------------------------------------------------------------------------------------------------------------------------------------------------------------- Assessment and Plan  Type 2 diabetes mellitus without complication, without long-term current use of insulin (HCC) Diabetes overall has been well controlled Agreeable to continue metformin, will change to Metformin xr.  Having some hypoglycemia, reduce glipizide to 10mg  (currently taking 20mg ) Will check A1c today Update B12 levels.   B12 deficiency Update b12   Paranoid schizophrenia, chronic condition Stable, followed by psychiatry for medication management.

## 2018-02-22 ENCOUNTER — Ambulatory Visit (INDEPENDENT_AMBULATORY_CARE_PROVIDER_SITE_OTHER): Payer: Self-pay

## 2018-02-22 ENCOUNTER — Ambulatory Visit (INDEPENDENT_AMBULATORY_CARE_PROVIDER_SITE_OTHER): Payer: Medicare Other | Admitting: Physical Medicine and Rehabilitation

## 2018-02-22 ENCOUNTER — Encounter (INDEPENDENT_AMBULATORY_CARE_PROVIDER_SITE_OTHER): Payer: Self-pay | Admitting: Physical Medicine and Rehabilitation

## 2018-02-22 VITALS — BP 121/81

## 2018-02-22 DIAGNOSIS — M5417 Radiculopathy, lumbosacral region: Secondary | ICD-10-CM

## 2018-02-22 DIAGNOSIS — M5416 Radiculopathy, lumbar region: Secondary | ICD-10-CM

## 2018-02-22 DIAGNOSIS — M5116 Intervertebral disc disorders with radiculopathy, lumbar region: Secondary | ICD-10-CM

## 2018-02-22 MED ORDER — BETAMETHASONE SOD PHOS & ACET 6 (3-3) MG/ML IJ SUSP
12.0000 mg | Freq: Once | INTRAMUSCULAR | Status: AC
  Administered 2018-02-22: 12 mg

## 2018-02-22 NOTE — Procedures (Signed)
S1 Lumbosacral Transforaminal Epidural Steroid Injection - Sub-Pedicular Approach with Fluoroscopic Guidance   Patient: Glenn Robertson      Date of Birth: [birth year] MRN: [id number] PCP: Leonard Downing, MD      Visit Date: 02/22/2018   Universal Protocol:    Date/Time: 06/27/191:26 PM  Consent Given By: the patient  Position:  PRONE  Additional Comments: Vital signs were monitored before and after the procedure. Patient was prepped and draped in the usual sterile fashion. The correct patient, procedure, and site was verified.   Injection Procedure Details:  Procedure Site One Meds Administered:  Meds ordered this encounter  Medications  . betamethasone acetate-betamethasone sodium phosphate (CELESTONE) injection 12 mg    Laterality: Left  Location/Site:  S1 Foramen   Needle size: 22 ga.  Needle type: Spinal  Needle Placement: Transforaminal  Findings:   -Comments: Excellent flow of contrast along the nerve and into the epidural space.  Procedure Details: After squaring off the sacral end-plate to get a true AP view, the C-arm was positioned so that the best possible view of the S1 foramen was visualized. The soft tissues overlying this structure were infiltrated with 2-3 ml. of 1% Lidocaine without Epinephrine.    The spinal needle was inserted toward the target using a "trajectory" view along the fluoroscope beam.  Under AP and lateral visualization, the needle was advanced so it did not puncture dura. Biplanar projections were used to confirm position. Aspiration was confirmed to be negative for CSF and/or blood. A 1-2 ml. volume of Isovue-250 was injected and flow of contrast was noted at each level. Radiographs were obtained for documentation purposes.   After attaining the desired flow of contrast documented above, a 0.5 to 1.0 ml test dose of 0.25% Marcaine was injected into each respective transforaminal space.  The patient was observed for 90 seconds post  injection.  After no sensory deficits were reported, and normal lower extremity motor function was noted,   the above injectate was administered so that equal amounts of the injectate were placed at each foramen (level) into the transforaminal epidural space.   Additional Comments:  The patient tolerated the procedure well Dressing: Band-Aid    Post-procedure details: Patient was observed during the procedure. Post-procedure instructions were reviewed.  Patient left the clinic in stable condition.

## 2018-02-22 NOTE — Progress Notes (Signed)
 .  Numeric Pain Rating Scale and Functional Assessment Average Pain 4   In the last MONTH (on 0-10 scale) has pain interfered with the following?  1. General activity like being  able to carry out your everyday physical activities such as walking, climbing stairs, carrying groceries, or moving a chair?  Rating(3)   +Driver, -BT, -Dye Allergies.  

## 2018-02-22 NOTE — Patient Instructions (Signed)

## 2018-02-23 NOTE — Progress Notes (Addendum)
F[place] Sabra Heck - 52 y.o. male MRN [id number]  Date of birth: 1966-07-25  Office Visit Note: Visit Date: 02/22/2018 PCP: Leonard Downing, MD Referred by: Leonard Downing, *  Subjective: Chief Complaint  Patient presents with  . Lower Back - Pain  . Left Foot - Numbness   HPI: Mr. Dominic is a very pleasant 52 year old gentleman with chronic worsening right hip and buttock pain with some referral in the hamstring.  He has been followed by Dr. Eduard Roux since in today with request for epidural injection.  Patient has had MRI evidence of the years of degenerative disc disease with disc height loss at L5-S1 with history of prior disc herniation with free fragment.  Patient is also seeing Dr. Inda Merlin in the past.  He has had injection in the past.  Last injection was performed by Dr. Brien Few earlier this year he cannot really remember if it seemed to help or not.  Clearly the symptoms are ongoing.  He has been formal physical therapy with core strengthening.  He is trying to continue to be active.  His case is complicated by schizoaffective disorder.  We are going to complete a right S1 transforaminal epidural steroid injection.  The patient is somewhat fixated on the sacroiliac joint although I think in a male 52 years of age it is doubtful that it is sacroiliitis.  Depending on relief Dr. Erlinda Hong could look at rheumatological referral.   ROS Otherwise per HPI.  Assessment & Plan: Visit Diagnoses:  1. Lumbar radiculopathy   2. Lumbosacral radiculopathy at S1   3. Radiculopathy due to lumbar intervertebral disc disorder     Plan: No additional findings.   Meds & Orders:  Meds ordered this encounter  Medications  . betamethasone acetate-betamethasone sodium phosphate (CELESTONE) injection 12 mg    Orders Placed This Encounter  Procedures  . XR C-ARM NO REPORT  . Epidural Steroid injection    Follow-up: Return if symptoms worsen or fail to improve.   Procedures: No procedures  performed the level in the note is dictated wrong and we did complete a right S1 transforaminal injection and this is confirmed in the fluoroscopic imaging which is saved to canopy S1 Lumbosacral Transforaminal Epidural Steroid Injection - Sub-Pedicular Approach with Fluoroscopic Guidance   Patient: [place]      Date of Birth: Jun 26, 1966 MRN: [id number] PCP: Leonard Downing, MD      Visit Date: 02/22/2018   Universal Protocol:    Date/Time: 06/27/191:26 PM  Consent Given By: the patient  Position:  PRONE  Additional Comments: Vital signs were monitored before and after the procedure. Patient was prepped and draped in the usual sterile fashion. The correct patient, procedure, and site was verified.   Injection Procedure Details:  Procedure Site One Meds Administered:  Meds ordered this encounter  Medications  . betamethasone acetate-betamethasone sodium phosphate (CELESTONE) injection 12 mg    Laterality: Left  Location/Site:  S1 Foramen   Needle size: 22 ga.  Needle type: Spinal  Needle Placement: Transforaminal  Findings:   -Comments: Excellent flow of contrast along the nerve and into the epidural space.  Procedure Details: After squaring off the sacral end-plate to get a true AP view, the C-arm was positioned so that the best possible view of the S1 foramen was visualized. The soft tissues overlying this structure were infiltrated with 2-3 ml. of 1% Lidocaine without Epinephrine.    The spinal needle was inserted toward the target using a "trajectory" view  along the fluoroscope beam.  Under AP and lateral visualization, the needle was advanced so it did not puncture dura. Biplanar projections were used to confirm position. Aspiration was confirmed to be negative for CSF and/or blood. A 1-2 ml. volume of Isovue-250 was injected and flow of contrast was noted at each level. Radiographs were obtained for documentation purposes.   After attaining the desired  flow of contrast documented above, a 0.5 to 1.0 ml test dose of 0.25% Marcaine was injected into each respective transforaminal space.  The patient was observed for 90 seconds post injection.  After no sensory deficits were reported, and normal lower extremity motor function was noted,   the above injectate was administered so that equal amounts of the injectate were placed at each foramen (level) into the transforaminal epidural space.   Additional Comments:  The patient tolerated the procedure well Dressing: Band-Aid    Post-procedure details: Patient was observed during the procedure. Post-procedure instructions were reviewed.  Patient left the clinic in stable condition.     Clinical History: MRI LUMBAR SPINE WITHOUT CONTRAST  TECHNIQUE: Multiplanar, multisequence MR imaging of the lumbar spine was performed. No intravenous contrast was administered.  COMPARISON:  02/16/2017  FINDINGS: Segmentation:  Standard.  Alignment:  Physiologic.  Vertebrae:  No fracture, evidence of discitis, or bone lesion.  Conus medullaris and cauda equina: Conus extends to the L1 level. Conus and cauda equina appear normal.  Paraspinal and other soft tissues: No acute paraspinal abnormality.  Other: Mild osteoarthritis of bilateral sacroiliac joints.  Disc levels:  Disc spaces: Degenerative disease with disc height loss at L5-S1.  T12-L1: No significant disc bulge. No evidence of neural foraminal stenosis. No central canal stenosis.  L1-L2: No significant disc bulge. No evidence of neural foraminal stenosis. No central canal stenosis.  L2-L3: Minimal broad-based disc bulge. No evidence of neural foraminal stenosis. No central canal stenosis.  L3-L4: Mild broad-based disc bulge. No evidence of neural foraminal stenosis. No central canal stenosis.  L4-L5: Mild broad-based disc bulge. No evidence of neural foraminal stenosis. No central canal stenosis.  L5-S1:  Broad disc protrusion eccentric towards the left. Disc abuts bilateral S1 nerve roots. No evidence of neural foraminal stenosis. No central canal stenosis.  IMPRESSION: 1. At L5-S1 there is a broad disc protrusion eccentric towards the left with the disc abutting bilateral S1 nerve roots.   Electronically Signed   By: Kathreen Devoid   On: 11/25/2017 10:39   He reports that he has never smoked. He has never used smokeless tobacco. No results for input(s): HGBA1C, LABURIC in the last 8760 hours.  Objective:  VS:  HT:    WT:   BMI:     BP:121/81  HR: bpm  TEMP: ( )  RESP:  Physical Exam  Musculoskeletal:  Patient ambulates without aid with good distal strength.  He does have pain on the right with palpation over the PSIS.  He has no pain with hip external rotation or internal.  His pain really is above the beltline and below the belt line.    Ortho Exam Imaging: Xr C-arm No Report  Result Date: 02/22/2018 Please see Notes tab for imaging impression.   Past Medical/Family/Surgical/Social History: Medications & Allergies reviewed per EMR, new medications updated. Patient Active Problem List   Diagnosis Date Noted  . Type 2 diabetes mellitus without complication, without long-term current use of insulin ([place]) 02/16/2018  . B12 deficiency 02/16/2018  . Screening for colon cancer 02/16/2018  . Left  wrist pain 07/12/2016  . Lumbosacral radiculopathy at S1 03/15/2016  . Lumbar degenerative disc disease 12/18/2015  . Subacromial impingement of left shoulder 10/26/2015  . Left lumbar radiculitis 04/21/2015  . Disc displacement, lumbar 04/21/2015  . Chondromalacia of both patellae 02/24/2015  . Paranoid schizophrenia, chronic condition ([place]) 02/24/2015  . Low back pain 07/18/2014  . Knee pain, acute 07/18/2014  . Right ankle pain 01/27/2014  . Pain in joint, ankle and foot 01/27/2014  . Sciatica 01/10/2012  . Thoracic or lumbosacral neuritis or radiculitis, unspecified  12/15/2011  . Disturbance of skin sensation 11/14/2011   Past Medical History:  Diagnosis Date  . Allergy   . Asthma   . Chronic pain   . Depression   . Diabetes mellitus   . Neuromuscular disorder ([place])    Family History  Problem Relation Age of Onset  . Diabetes Father   . Diabetes Paternal Uncle    Past Surgical History:  Procedure Laterality Date  . Arm Surgery  1/12   rt ulnar nerve decompression  . EPIDURAL BLOCK INJECTION     several  . ULNAR NERVE TRANSPOSITION  01/19/2012   Procedure: ULNAR NERVE DECOMPRESSION/TRANSPOSITION;  Surgeon: Cammie Sickle., MD;  Location: [place];  Service: Orthopedics;  Laterality: Left;  ulnar nerve decompression vs transposition left elbow   Social History   Occupational History  . Not on file  Tobacco Use  . Smoking status: Never Smoker  . Smokeless tobacco: Never Used  Substance and Sexual Activity  . Alcohol use: No  . Drug use: No  . Sexual activity: Not on file

## 2018-03-02 ENCOUNTER — Ambulatory Visit (INDEPENDENT_AMBULATORY_CARE_PROVIDER_SITE_OTHER): Payer: Medicare Other | Admitting: Family Medicine

## 2018-03-02 ENCOUNTER — Encounter: Payer: Self-pay | Admitting: Family Medicine

## 2018-03-02 VITALS — BP 114/78 | HR 89 | Temp 98.0°F | Ht 70.0 in | Wt 207.4 lb

## 2018-03-02 DIAGNOSIS — E538 Deficiency of other specified B group vitamins: Secondary | ICD-10-CM

## 2018-03-02 DIAGNOSIS — R519 Headache, unspecified: Secondary | ICD-10-CM | POA: Insufficient documentation

## 2018-03-02 DIAGNOSIS — E119 Type 2 diabetes mellitus without complications: Secondary | ICD-10-CM | POA: Diagnosis not present

## 2018-03-02 DIAGNOSIS — R51 Headache: Secondary | ICD-10-CM

## 2018-03-02 LAB — MICROALBUMIN / CREATININE URINE RATIO
CREATININE, U: 15.4 mg/dL
MICROALB/CREAT RATIO: 4.6 mg/g (ref 0.0–30.0)

## 2018-03-02 MED ORDER — NAPROXEN 500 MG PO TABS: 500.0000 mg | ORAL_TABLET | Freq: Two times a day (BID) | ORAL | 1 refills | Status: DC | PRN

## 2018-03-02 MED ORDER — GLUCOSE BLOOD VI STRP: ORAL_STRIP | 6 refills | Status: DC

## 2018-03-02 NOTE — Addendum Note (Signed)
Addended by: Lynnea Ferrier on: 03/02/2018 03:22 PM   Modules accepted: Orders

## 2018-03-02 NOTE — Patient Instructions (Signed)

## 2018-03-02 NOTE — Assessment & Plan Note (Signed)
Sounds to be tension type headaches Trial of naproxen as needed for episodes Recommend keeping a headache journal.

## 2018-03-02 NOTE — Progress Notes (Signed)
Glenn Robertson - 52 y.o. male MRN [id number]  Date of birth: 1966-03-28  Subjective Chief Complaint  Patient presents with  . Headache    headaches on right side of head lately, not sure why (sinus or allergies?)  . Medication Refill    needs DM test strips, some sort of special form pharmacy requires. (due to testing 3 times a day)    HPI Glenn Robertson is a 52 y.o. male here today with complaint of headache.  Reports having headache 2-3x/month.  Located on the top of the scalp and neck area typically.    He denies associated aura, vision change, light sensitivity, nausea or vomiting or  He has no prior history of migraine.  He denies sinus pain, fever chills or tooth pain.  Typically will use tylenol which does provide some relief.  Needs refill of diabetes test strips.  Allergies  Allergen Reactions  . Citric Acid Other (See Comments)    Runny nose, eyes water  . Food     Oranges or anything with citric acid    Past Medical History:  Diagnosis Date  . Allergy   . Asthma   . Chronic pain   . Depression   . Diabetes mellitus   . Neuromuscular disorder [facility])     Past Surgical History:  Procedure Laterality Date  . Arm Surgery  1/12   rt ulnar nerve decompression  . EPIDURAL BLOCK INJECTION     several  . ULNAR NERVE TRANSPOSITION  01/19/2012   Procedure: ULNAR NERVE DECOMPRESSION/TRANSPOSITION;  Surgeon: Cammie Sickle., MD;  Location: [place];  Service: Orthopedics;  Laterality: Left;  ulnar nerve decompression vs transposition left elbow    Social History   Socioeconomic History  . Marital status: Single    Spouse name: Not on file  . Number of children: Not on file  . Years of education: Not on file  . Highest education level: Not on file  Occupational History  . Not on file  Social Needs  . Financial resource strain: Not on file  . Food insecurity:    Worry: Not on file    Inability: Not on file  . Transportation needs:    Medical: Not  on file    Non-medical: Not on file  Tobacco Use  . Smoking status: Never Smoker  . Smokeless tobacco: Never Used  Substance and Sexual Activity  . Alcohol use: No  . Drug use: No  . Sexual activity: Not on file  Lifestyle  . Physical activity:    Days per week: Not on file    Minutes per session: Not on file  . Stress: Not on file  Relationships  . Social connections:    Talks on phone: Not on file    Gets together: Not on file    Attends religious service: Not on file    Active member of club or organization: Not on file    Attends meetings of clubs or organizations: Not on file    Relationship status: Not on file  Other Topics Concern  . Not on file  Social History Narrative  . Not on file    Family History  Problem Relation Age of Onset  . Diabetes Father   . Diabetes Paternal Uncle     Health Maintenance  Topic Date Due  . HEMOGLOBIN A1C  September 30, 1966  . PNEUMOCOCCAL POLYSACCHARIDE VACCINE (1) 09/02/1968  . FOOT EXAM  09/02/1976  . OPHTHALMOLOGY EXAM  09/02/1976  . URINE  MICROALBUMIN  09/02/1976  . HIV Screening  09/02/1981  . TETANUS/TDAP  09/02/1985  . COLONOSCOPY  09/02/2016  . INFLUENZA VACCINE  03/29/2018    ----------------------------------------------------------------------------------------------------------------------------------------------------------------------------------------------------------------- Physical Exam BP 114/78 (BP Location: Left Arm, Patient Position: Sitting, Cuff Size: Normal)   Pulse 89   Temp 98 F (36.7 C) (Oral)   Ht 5\' 10"  (1.778 m)   Wt 207 lb 6.4 oz (94.1 kg)   SpO2 98%   BMI 29.76 kg/m   Physical Exam  Constitutional: He is oriented to person, place, and time. He appears well-nourished.  HENT:  Head: Normocephalic and atraumatic.  Eyes: Pupils are equal, round, and reactive to light. EOM are normal. No scleral icterus.  Neck: Normal range of motion. Neck supple. No neck rigidity.  Cardiovascular: Normal  rate, regular rhythm, normal heart sounds and intact distal pulses.  Pulmonary/Chest: Effort normal and breath sounds normal.  Musculoskeletal: He exhibits no edema.  Neurological: He is alert and oriented to person, place, and time. He has normal strength. No cranial nerve deficit or sensory deficit. Coordination and gait normal.  Skin: No rash noted.  Psychiatric: He has a normal mood and affect. His behavior is normal.    ------------------------------------------------------------------------------------------------------------------------------------------------------------------------------------------------------------------- Assessment and Plan  Headache Sounds to be tension type headaches Trial of naproxen as needed for episodes Recommend keeping a headache journal.

## 2018-03-03 LAB — BASIC METABOLIC PANEL
BUN: 14 mg/dL (ref 7–25)
CHLORIDE: 102 mmol/L (ref 98–110)
CO2: 28 mmol/L (ref 20–32)
Calcium: 9.9 mg/dL (ref 8.6–10.3)
Creat: 1.1 mg/dL (ref 0.70–1.33)
GLUCOSE: 97 mg/dL (ref 65–99)
Potassium: 4.3 mmol/L (ref 3.5–5.3)
SODIUM: 137 mmol/L (ref 135–146)

## 2018-03-03 LAB — HEMOGLOBIN A1C
EAG (MMOL/L): 6.8 (calc)
HEMOGLOBIN A1C: 5.9 %{Hb} — AB (ref <5.7)
MEAN PLASMA GLUCOSE: 123 (calc)

## 2018-03-03 LAB — VITAMIN B12: Vitamin B-12: 866 pg/mL (ref 200–1100)

## 2018-03-07 ENCOUNTER — Ambulatory Visit (INDEPENDENT_AMBULATORY_CARE_PROVIDER_SITE_OTHER): Payer: Medicare Other | Admitting: Orthopaedic Surgery

## 2018-03-07 ENCOUNTER — Encounter (INDEPENDENT_AMBULATORY_CARE_PROVIDER_SITE_OTHER): Payer: Self-pay | Admitting: Orthopaedic Surgery

## 2018-03-07 DIAGNOSIS — M545 Low back pain: Secondary | ICD-10-CM | POA: Diagnosis not present

## 2018-03-07 DIAGNOSIS — M5417 Radiculopathy, lumbosacral region: Secondary | ICD-10-CM

## 2018-03-07 NOTE — Progress Notes (Signed)
Office Visit Note   Patient: Glenn Robertson           Date of Birth: 09/04/1965           MRN: [id number] Visit Date: 03/07/2018              Requested by: Glenn Downing, MD [address] [place], [place] [address] PCP: Glenn Downing, MD   Assessment & Plan: Visit Diagnoses:  1. Lumbosacral radiculopathy at S1   2. Low back pain, unspecified back pain laterality, unspecified chronicity, with sciatica presence unspecified     Plan: Impression is right lumbar back pain and possible SI joint dysfunction.  At this point after review of the MRI I do not have any objective findings I can explain his continued pain.  I recommend further evaluation with Dr. Ernestina Robertson to see if he would benefit from another ESI versus an SI joint injection.  Follow-up with me as needed. Total face to face encounter time was greater than 25 minutes and over half of this time was spent in counseling and/or coordination of care.  Follow-Up Instructions: Return if symptoms worsen or fail to improve.   Orders:  Orders Placed This Encounter  Procedures  . Ambulatory referral to Physical Medicine Rehab   No orders of the defined types were placed in this encounter.     Procedures: No procedures performed   Clinical Data: No additional findings.   Subjective: Chief Complaint  Patient presents with  . Right Hip - Pain  . Left Hip - Pain    Glenn Robertson follows up today status post epidural steroid injection with Dr. Ernestina Robertson.  He states that his left-sided pain has significantly improved.  He has a dull ache in his right low back and buttock region that is worse while walking and jogging.   Review of Systems   Objective: Vital Signs: There were no vitals taken for this visit.  Physical Exam  Ortho Exam Right hip exam is benign.  He has discomfort with palpation of the lumbar segments and the right SI joint. Specialty Comments:  No specialty comments available.  Imaging: No  results found.   PMFS History: Patient Active Problem List   Diagnosis Date Noted  . Headache 03/02/2018  . Type 2 diabetes mellitus without complication, without long-term current use of insulin ([place]) 02/16/2018  . B12 deficiency 02/16/2018  . Screening for colon cancer 02/16/2018  . Left wrist pain 07/12/2016  . Lumbosacral radiculopathy at S1 03/15/2016  . Lumbar degenerative disc disease 12/18/2015  . Subacromial impingement of left shoulder 10/26/2015  . Left lumbar radiculitis 04/21/2015  . Disc displacement, lumbar 04/21/2015  . Chondromalacia of both patellae 02/24/2015  . Paranoid schizophrenia, chronic condition ([place]) 02/24/2015  . Low back pain 07/18/2014  . Knee pain, acute 07/18/2014  . Right ankle pain 01/27/2014  . Pain in joint, ankle and foot 01/27/2014  . Sciatica 01/10/2012  . Thoracic or lumbosacral neuritis or radiculitis, unspecified 12/15/2011  . Disturbance of skin sensation 11/14/2011   Past Medical History:  Diagnosis Date  . Allergy   . Asthma   . Chronic pain   . Depression   . Diabetes mellitus   . Neuromuscular disorder ([place])     Family History  Problem Relation Age of Onset  . Diabetes Father   . Diabetes Paternal Uncle     Past Surgical History:  Procedure Laterality Date  . Arm Surgery  1/12   rt ulnar nerve decompression  .  EPIDURAL BLOCK INJECTION     several  . ULNAR NERVE TRANSPOSITION  01/19/2012   Procedure: ULNAR NERVE DECOMPRESSION/TRANSPOSITION;  Surgeon: Cammie Sickle., MD;  Location: [facility];  Service: Orthopedics;  Laterality: Left;  ulnar nerve decompression vs transposition left elbow   Social History   Occupational History  . Not on file  Tobacco Use  . Smoking status: Never Smoker  . Smokeless tobacco: Never Used  Substance and Sexual Activity  . Alcohol use: No  . Drug use: No  . Sexual activity: Not on file

## 2018-03-08 ENCOUNTER — Telehealth: Payer: Self-pay | Admitting: Family Medicine

## 2018-03-08 ENCOUNTER — Other Ambulatory Visit: Payer: Self-pay | Admitting: Family Medicine

## 2018-03-08 DIAGNOSIS — R5383 Other fatigue: Secondary | ICD-10-CM

## 2018-03-08 NOTE — Telephone Encounter (Signed)
Spoke with pt and informed him lab had been ordered and to come in at his leisure

## 2018-03-08 NOTE — Telephone Encounter (Signed)
Future orders entered, he can come in to have this done at his convenience

## 2018-03-08 NOTE — Progress Notes (Signed)
-  A1c of 5.9 indicates that blood sugars are well controlled.  -Additional labs are normal as well.

## 2018-03-08 NOTE — Telephone Encounter (Signed)
This is for you!

## 2018-03-08 NOTE — Telephone Encounter (Signed)
Copied from [place] [id number]. Topic: Quick Communication - See Telephone Encounter >> Mar 08, 2018  3:14 PM Bea Graff, NT wrote: CRM for notification. See Telephone encounter for: 03/08/18. Pt would like to see if an order to have his thyroid tested can be put in.

## 2018-03-12 DIAGNOSIS — F310 Bipolar disorder, current episode hypomanic: Secondary | ICD-10-CM | POA: Diagnosis not present

## 2018-03-12 DIAGNOSIS — Z79899 Other long term (current) drug therapy: Secondary | ICD-10-CM | POA: Diagnosis not present

## 2018-03-15 ENCOUNTER — Other Ambulatory Visit (INDEPENDENT_AMBULATORY_CARE_PROVIDER_SITE_OTHER): Payer: Medicare Other

## 2018-03-15 ENCOUNTER — Ambulatory Visit: Payer: Medicare Other | Admitting: Psychology

## 2018-03-15 DIAGNOSIS — R5383 Other fatigue: Secondary | ICD-10-CM

## 2018-03-15 LAB — TSH: TSH: 0.92 u[IU]/mL (ref 0.35–4.50)

## 2018-03-16 ENCOUNTER — Ambulatory Visit (INDEPENDENT_AMBULATORY_CARE_PROVIDER_SITE_OTHER): Payer: Medicare Other | Admitting: Family Medicine

## 2018-03-16 ENCOUNTER — Encounter: Payer: Self-pay | Admitting: Family Medicine

## 2018-03-16 VITALS — BP 118/80 | HR 87 | Temp 97.5°F | Ht 70.0 in | Wt 205.6 lb

## 2018-03-16 DIAGNOSIS — J4540 Moderate persistent asthma, uncomplicated: Secondary | ICD-10-CM | POA: Diagnosis not present

## 2018-03-16 DIAGNOSIS — J45909 Unspecified asthma, uncomplicated: Secondary | ICD-10-CM | POA: Insufficient documentation

## 2018-03-16 DIAGNOSIS — E119 Type 2 diabetes mellitus without complications: Secondary | ICD-10-CM

## 2018-03-16 MED ORDER — GLIPIZIDE 10 MG PO TABS: 10.0000 mg | ORAL_TABLET | Freq: Two times a day (BID) | ORAL | 3 refills | Status: DC

## 2018-03-16 MED ORDER — MONTELUKAST SODIUM 10 MG PO TABS: 10.0000 mg | ORAL_TABLET | Freq: Every day | ORAL | 3 refills | Status: DC

## 2018-03-16 MED ORDER — ALBUTEROL SULFATE HFA 108 (90 BASE) MCG/ACT IN AERS: 2.0000 | INHALATION_SPRAY | RESPIRATORY_TRACT | 6 refills | Status: DC | PRN

## 2018-03-16 MED ORDER — FLUTICASONE PROPIONATE 50 MCG/ACT NA SUSP: 2.0000 | Freq: Every day | NASAL | 6 refills | Status: DC

## 2018-03-16 MED ORDER — SIMVASTATIN 20 MG PO TABS: 20.0000 mg | ORAL_TABLET | Freq: Every evening | ORAL | 3 refills | Status: DC

## 2018-03-16 NOTE — Assessment & Plan Note (Signed)
Well controlled at this time -Will continue singulair with albuterol as needed

## 2018-03-16 NOTE — Assessment & Plan Note (Signed)
-  Recent A1c indicates diabetes is well controlled -Request to meet with nutrition, referral placed -F/u 3 months for diabetes.

## 2018-03-16 NOTE — Progress Notes (Signed)
Glenn Robertson - 52 y.o. male MRN [id number]  Date of birth: 1966-04-01  Subjective Chief Complaint  Patient presents with  . Nutrition Counseling    would like to go to a nutritionist to loose weight.   . medication refills    HPI Glenn Robertson is a 52 y.o. male here today for discuss diabetes and needs renewals of asthma medications  -Diabetes: Recent a1c of 5.9% indicates that diabetes is well controlled.  He is concerned about his weight and would like to meet with nutrition/diabetes education to see what changes he may be able to make to improve this.  Still having occasional hypoglycemia after strenuous exercise but improved since reduction in glipizide.  -Asthma:  Asthma has been well controlled with use of daily singulair and albuterol as needed.  Denies recent flare, increased wheezing, shortness of breath, or cough.   ROS:  ROS completed and negative except as noted per HPI Allergies  Allergen Reactions  . Citric Acid Other (See Comments)    Runny nose, eyes water  . Food     Oranges or anything with citric acid    Past Medical History:  Diagnosis Date  . Allergy   . Asthma   . Chronic pain   . Depression   . Diabetes mellitus   . Neuromuscular disorder [facility])     Past Surgical History:  Procedure Laterality Date  . Arm Surgery  1/12   rt ulnar nerve decompression  . EPIDURAL BLOCK INJECTION     several  . ULNAR NERVE TRANSPOSITION  01/19/2012   Procedure: ULNAR NERVE DECOMPRESSION/TRANSPOSITION;  Surgeon: Cammie Sickle., MD;  Location: [facility];  Service: Orthopedics;  Laterality: Left;  ulnar nerve decompression vs transposition left elbow    Social History   Socioeconomic History  . Marital status: Single    Spouse name: Not on file  . Number of children: Not on file  . Years of education: Not on file  . Highest education level: Not on file  Occupational History  . Not on file  Social Needs  . Financial resource strain: Not on  file  . Food insecurity:    Worry: Not on file    Inability: Not on file  . Transportation needs:    Medical: Not on file    Non-medical: Not on file  Tobacco Use  . Smoking status: Never Smoker  . Smokeless tobacco: Never Used  Substance and Sexual Activity  . Alcohol use: No  . Drug use: No  . Sexual activity: Not on file  Lifestyle  . Physical activity:    Days per week: Not on file    Minutes per session: Not on file  . Stress: Not on file  Relationships  . Social connections:    Talks on phone: Not on file    Gets together: Not on file    Attends religious service: Not on file    Active member of club or organization: Not on file    Attends meetings of clubs or organizations: Not on file    Relationship status: Not on file  Other Topics Concern  . Not on file  Social History Narrative  . Not on file    Family History  Problem Relation Age of Onset  . Diabetes Father   . Diabetes Paternal Uncle     Health Maintenance  Topic Date Due  . PNEUMOCOCCAL POLYSACCHARIDE VACCINE (1) 09/02/1968  . FOOT EXAM  09/02/1976  . OPHTHALMOLOGY EXAM  09/02/1976  . HIV Screening  09/02/1981  . TETANUS/TDAP  09/02/1985  . COLONOSCOPY  09/02/2016  . INFLUENZA VACCINE  03/29/2018  . HEMOGLOBIN A1C  09/02/2018  . URINE MICROALBUMIN  03/03/2019    ----------------------------------------------------------------------------------------------------------------------------------------------------------------------------------------------------------------- Physical Exam BP 118/80 (BP Location: Left Arm, Patient Position: Sitting, Cuff Size: Normal)   Pulse 87   Temp (!) 97.5 F (36.4 C) (Oral)   Ht 5\' 10"  (1.778 m)   Wt 205 lb 9.6 oz (93.3 kg)   SpO2 97%   BMI 29.50 kg/m   Physical Exam  Constitutional: He is oriented to person, place, and time. He appears well-nourished. No distress.  HENT:  Head: Normocephalic and atraumatic.  Mouth/Throat: Oropharynx is clear and  moist.  Eyes: No scleral icterus.  Cardiovascular: Normal rate, regular rhythm and normal heart sounds.  Pulmonary/Chest: Effort normal and breath sounds normal.  Neurological: He is alert and oriented to person, place, and time.  Psychiatric: He has a normal mood and affect. His behavior is normal.    ------------------------------------------------------------------------------------------------------------------------------------------------------------------------------------------------------------------- Assessment and Plan  Asthma Well controlled at this time -Will continue singulair with albuterol as needed   Type 2 diabetes mellitus without complication, without long-term current use of insulin ([facility]) -Recent A1c indicates diabetes is well controlled -Request to meet with nutrition, referral placed -F/u 3 months for diabetes.

## 2018-03-16 NOTE — Patient Instructions (Signed)
I have placed a referral to nutrition, you will be contacted for an appointment.  Continue current medication Follow up with me in 3 months.

## 2018-03-19 ENCOUNTER — Ambulatory Visit (INDEPENDENT_AMBULATORY_CARE_PROVIDER_SITE_OTHER): Payer: Medicare Other | Admitting: Psychology

## 2018-03-19 DIAGNOSIS — F200 Paranoid schizophrenia: Secondary | ICD-10-CM | POA: Diagnosis not present

## 2018-03-19 DIAGNOSIS — F4310 Post-traumatic stress disorder, unspecified: Secondary | ICD-10-CM | POA: Diagnosis not present

## 2018-03-19 DIAGNOSIS — F331 Major depressive disorder, recurrent, moderate: Secondary | ICD-10-CM | POA: Diagnosis not present

## 2018-03-21 ENCOUNTER — Telehealth: Payer: Self-pay | Admitting: Podiatry

## 2018-03-21 NOTE — Telephone Encounter (Signed)
Pt left message asking for you to call him about his orthotics. He stated he is still having left foot pain when wearing the orthotics.Marland KitchenMarland Kitchen

## 2018-03-22 DIAGNOSIS — H401231 Low-tension glaucoma, bilateral, mild stage: Secondary | ICD-10-CM | POA: Diagnosis not present

## 2018-03-22 DIAGNOSIS — H43813 Vitreous degeneration, bilateral: Secondary | ICD-10-CM | POA: Diagnosis not present

## 2018-03-23 ENCOUNTER — Ambulatory Visit (INDEPENDENT_AMBULATORY_CARE_PROVIDER_SITE_OTHER): Payer: Medicare Other | Admitting: Physician Assistant

## 2018-03-23 ENCOUNTER — Encounter (INDEPENDENT_AMBULATORY_CARE_PROVIDER_SITE_OTHER): Payer: Self-pay | Admitting: Orthopaedic Surgery

## 2018-03-23 VITALS — Ht 70.0 in | Wt 205.0 lb

## 2018-03-23 DIAGNOSIS — M7061 Trochanteric bursitis, right hip: Secondary | ICD-10-CM | POA: Diagnosis not present

## 2018-03-23 NOTE — Progress Notes (Signed)
Office Visit Note   Patient: Glenn Robertson           Date of Birth: [birth year]           MRN: [id number] Visit Date: 03/23/2018              Requested by: Leonard Downing, MD [place], [place] [address] PCP: Leonard Downing, MD   Assessment & Plan: Visit Diagnoses:  1. Trochanteric bursitis, right hip     Plan: Impression is right sided trochanteric bursitis.  Today, we discussed cortisone injection into the bursa.  He does not believe his pain is bad enough for that today.  He will continue with iliotibial band stretching.  He will proceed with his SI joint injection with Dr. Ernestina Patches in a few weeks.  He will follow-up with [place] as needed.  Call with concerns or questions in the meantime.  Follow-Up Instructions: Return if symptoms worsen or fail to improve.   Orders:  No orders of the defined types were placed in this encounter.  No orders of the defined types were placed in this encounter.     Procedures: No procedures performed   Clinical Data: No additional findings.   Subjective: Chief Complaint  Patient presents with  . Right Hip - Pain    HPI  Glenn Robertson comes in today with right lateral hip pain.  This is been ongoing for the past few weeks.  Pain is intermittent and occurs when jogging or getting in and out of his car.  No groin or anterior thigh pain.  He has been doing IT band stretches which does seem to be helping.  No numbness, tingling or burning.  He does have a history of previous ESI injection with Dr. Ernestina Patches which was moderately helpful.  He has an upcoming right-sided SI joint injection beginning of August.  Review of Systems as detailed in HPI.  All others reviewed and are negative.   Objective: Vital Signs: Ht 5\' 10"  (1.778 m)   Wt 205 lb (93 kg)   BMI 29.41 kg/m   Physical Exam well-developed well-nourished gentleman no acute distress.  Alert and oriented x3.  Ortho Exam examination of his right lower extremity  reveals mild tenderness over the trochanteric bursa.  Negative logroll and negative straight leg raise.  Mild tenderness over the right SI joint.  No increased pain with lumbar flexion or extension.  He is neurovascularly intact distally.  Specialty Comments:  No specialty comments available.  Imaging: No new imaging   PMFS History: Patient Active Problem List   Diagnosis Date Noted  . Trochanteric bursitis, right hip 03/23/2018  . Asthma 03/16/2018  . Headache 03/02/2018  . Type 2 diabetes mellitus without complication, without long-term current use of insulin ([place]) 02/16/2018  . B12 deficiency 02/16/2018  . Screening for colon cancer 02/16/2018  . Left wrist pain 07/12/2016  . Lumbosacral radiculopathy at S1 03/15/2016  . Lumbar degenerative disc disease 12/18/2015  . Subacromial impingement of left shoulder 10/26/2015  . Left lumbar radiculitis 04/21/2015  . Disc displacement, lumbar 04/21/2015  . Chondromalacia of both patellae 02/24/2015  . Paranoid schizophrenia, chronic condition ([place]) 02/24/2015  . Low back pain 07/18/2014  . Knee pain, acute 07/18/2014  . Right ankle pain 01/27/2014  . Pain in joint, ankle and foot 01/27/2014  . Sciatica 01/10/2012  . Thoracic or lumbosacral neuritis or radiculitis, unspecified 12/15/2011  . Disturbance of skin sensation 11/14/2011   Past Medical History:  Diagnosis Date  .  Allergy   . Asthma   . Chronic pain   . Depression   . Diabetes mellitus   . Neuromuscular disorder ([place])     Family History  Problem Relation Age of Onset  . Diabetes Father   . Diabetes Paternal Uncle     Past Surgical History:  Procedure Laterality Date  . Arm Surgery  1/12   rt ulnar nerve decompression  . EPIDURAL BLOCK INJECTION     several  . ULNAR NERVE TRANSPOSITION  01/19/2012   Procedure: ULNAR NERVE DECOMPRESSION/TRANSPOSITION;  Surgeon: Cammie Sickle., MD;  Location: [facility];  Service: Orthopedics;  Laterality:  Left;  ulnar nerve decompression vs transposition left elbow   Social History   Occupational History  . Not on file  Tobacco Use  . Smoking status: Never Smoker  . Smokeless tobacco: Never Used  Substance and Sexual Activity  . Alcohol use: No  . Drug use: No  . Sexual activity: Not on file

## 2018-03-28 ENCOUNTER — Encounter (INDEPENDENT_AMBULATORY_CARE_PROVIDER_SITE_OTHER): Payer: Self-pay | Admitting: Physical Medicine and Rehabilitation

## 2018-04-03 ENCOUNTER — Encounter (INDEPENDENT_AMBULATORY_CARE_PROVIDER_SITE_OTHER): Payer: Medicare Other | Admitting: Physical Medicine and Rehabilitation

## 2018-04-04 ENCOUNTER — Encounter (INDEPENDENT_AMBULATORY_CARE_PROVIDER_SITE_OTHER): Payer: Self-pay | Admitting: Physical Medicine and Rehabilitation

## 2018-04-04 ENCOUNTER — Ambulatory Visit (INDEPENDENT_AMBULATORY_CARE_PROVIDER_SITE_OTHER): Payer: Medicare Other | Admitting: Sports Medicine

## 2018-04-04 ENCOUNTER — Ambulatory Visit (INDEPENDENT_AMBULATORY_CARE_PROVIDER_SITE_OTHER): Payer: Medicare Other | Admitting: Physical Medicine and Rehabilitation

## 2018-04-04 VITALS — BP 112/69 | HR 85

## 2018-04-04 VITALS — BP 122/82 | Ht 70.0 in | Wt 202.0 lb

## 2018-04-04 DIAGNOSIS — M6798 Unspecified disorder of synovium and tendon, other site: Secondary | ICD-10-CM | POA: Diagnosis not present

## 2018-04-04 DIAGNOSIS — M545 Low back pain: Principal | ICD-10-CM

## 2018-04-04 DIAGNOSIS — G8929 Other chronic pain: Secondary | ICD-10-CM

## 2018-04-04 DIAGNOSIS — M5116 Intervertebral disc disorders with radiculopathy, lumbar region: Secondary | ICD-10-CM

## 2018-04-04 DIAGNOSIS — G894 Chronic pain syndrome: Secondary | ICD-10-CM

## 2018-04-04 DIAGNOSIS — M67951 Unspecified disorder of synovium and tendon, right thigh: Secondary | ICD-10-CM

## 2018-04-04 NOTE — Progress Notes (Signed)
 .  Numeric Pain Rating Scale and Functional Assessment Average Pain 4   In the last MONTH (on 0-10 scale) has pain interfered with the following?  1. General activity like being  able to carry out your everyday physical activities such as walking, climbing stairs, carrying groceries, or moving a chair?  Rating(5)   +Driver, -BT, -Dye Allergies.

## 2018-04-04 NOTE — Progress Notes (Signed)
   HPI  CC: Right hip pain  Stimulator is a 52 year old male with history of schizophreniaDiabetes, sciatica who presents today for right hip pain.  He states his pain is been present for the past month.  He states he tried to do a couch to 5K starting at the beginning of July.  He states the pain was brought on during these runs.  He has not run since July 27.  At that time he went saw his primary care doctor, who gave him meloxicam 15 mg daily.  He is been taking it since that time with some relief.  He states this made worse by exertion.  He states the last experience of pain was during around that he attempted yesterday afternoon.  He was limited in his run due to pain.  He denies any numbness and tingling lower extremity.  He denies any weakness of the lower extremities.  See HPI and/or previous note for associated ROS.  Objective: BP 122/82   Ht 5\' 10"  (1.778 m)   Wt 202 lb (91.6 kg)   BMI 28.98 kg/m  Gen: NAD, well groomed, a/o x3, normal affect.  CV: Well-perfused. Warm.  Resp: Non-labored.  Neuro: Sensation intact throughout. No gross coordination deficits.  Gait: Nonpathologic posture, unremarkable stride without signs of limp.  Positive Trendelenburg test.  Right hip exam: No signs of inflammation, swelling, rash.  Tenderness palpation of the posterior edge of the greater trochanter.  Full range of motion throughout testing.  No pain with range of motion.  No pain with resisted range of motion.  Strength 5 out of 5 throughout all testing.  Negative logroll.  Negative FABER and FADIR testing.  Negative straight leg raise.  Assessment and plan: Right gluteus medius tendinopathy  [name] is had right posterior hip pain for the past month, likely secondary to gluteus medius tendinopathy based on physical exam and history.  He declines a corticosteroid injection into the tendon site at this time.  He declines formal physical therapy at this time.  We have advised him today on  stretches and strengthening exercises for abduction and gluteus medius.  Continue with NSAIDs as needed for pain relief.  Follow-up as needed for this issue.   Lewanda Rife, MD [facility] 04/04/2018 1:28 PM

## 2018-04-11 ENCOUNTER — Ambulatory Visit: Payer: Self-pay | Admitting: *Deleted

## 2018-04-13 ENCOUNTER — Encounter: Payer: Self-pay | Admitting: Family Medicine

## 2018-04-13 ENCOUNTER — Ambulatory Visit (INDEPENDENT_AMBULATORY_CARE_PROVIDER_SITE_OTHER): Payer: Medicare Other | Admitting: Family Medicine

## 2018-04-13 DIAGNOSIS — H9190 Unspecified hearing loss, unspecified ear: Secondary | ICD-10-CM | POA: Diagnosis not present

## 2018-04-13 NOTE — Assessment & Plan Note (Signed)
Normal hearing screening today He will let me know if he notices any changes to hearing.

## 2018-04-13 NOTE — Patient Instructions (Signed)
Your hearing screening is normal.  If you continue to notice issues that may be related to hearing loss please let me know We'll plant to keep your appt for October.

## 2018-04-13 NOTE — Progress Notes (Signed)
Glenn Robertson - 52 y.o. male MRN [id number]  Date of birth: [birth year]  Subjective Chief Complaint  Patient presents with  . hearing test    Bambi asked him to get a test.    HPI Glenn Robertson is a 52 y.o. male here today for evaluation of diminished hearing.  He states that his therapist asked that he come in to have hearing checked.  He tells me that he does not believe he has a problem hearing and believes that it may just be how he processes sounds.  He tells me that this has been going on for several years and is somewhat of a coping mechanism that he developed several years ago to block out the negative things in his house while growing up.  He denies tinnitus, ear pain, headache or pressure.   ROS:  A comprehensive ROS was completed and negative except as noted per HPI  Allergies  Allergen Reactions  . Citric Acid Other (See Comments)    Runny nose, eyes water  . Food     Oranges or anything with citric acid    Past Medical History:  Diagnosis Date  . Allergy   . Asthma   . Chronic pain   . Depression   . Diabetes mellitus   . Neuromuscular disorder [facility])     Past Surgical History:  Procedure Laterality Date  . Arm Surgery  1/12   rt ulnar nerve decompression  . EPIDURAL BLOCK INJECTION     several  . ULNAR NERVE TRANSPOSITION  01/19/2012   Procedure: ULNAR NERVE DECOMPRESSION/TRANSPOSITION;  Surgeon: Cammie Sickle., MD;  Location: [facility];  Service: Orthopedics;  Laterality: Left;  ulnar nerve decompression vs transposition left elbow    Social History   Socioeconomic History  . Marital status: Single    Spouse name: Not on file  . Number of children: Not on file  . Years of education: Not on file  . Highest education level: Not on file  Occupational History  . Not on file  Social Needs  . Financial resource strain: Not on file  . Food insecurity:    Worry: Not on file    Inability: Not on file  . Transportation needs:    Medical:  Not on file    Non-medical: Not on file  Tobacco Use  . Smoking status: Never Smoker  . Smokeless tobacco: Never Used  Substance and Sexual Activity  . Alcohol use: No  . Drug use: No  . Sexual activity: Not on file  Lifestyle  . Physical activity:    Days per week: Not on file    Minutes per session: Not on file  . Stress: Not on file  Relationships  . Social connections:    Talks on phone: Not on file    Gets together: Not on file    Attends religious service: Not on file    Active member of club or organization: Not on file    Attends meetings of clubs or organizations: Not on file    Relationship status: Not on file  Other Topics Concern  . Not on file  Social History Narrative  . Not on file    Family History  Problem Relation Age of Onset  . Diabetes Father   . Diabetes Paternal Uncle     Health Maintenance  Topic Date Due  . PNEUMOCOCCAL POLYSACCHARIDE VACCINE AGE 22-64 HIGH RISK  09/02/1968  . FOOT EXAM  09/02/1976  . OPHTHALMOLOGY EXAM  09/02/1976  . HIV Screening  09/02/1981  . TETANUS/TDAP  09/02/1985  . COLONOSCOPY  09/02/2016  . INFLUENZA VACCINE  03/29/2018  . HEMOGLOBIN A1C  09/02/2018  . URINE MICROALBUMIN  03/03/2019    ----------------------------------------------------------------------------------------------------------------------------------------------------------------------------------------------------------------- Physical Exam BP 112/80 (BP Location: Left Arm, Patient Position: Sitting, Cuff Size: Normal)   Pulse 79   Temp 97.7 F (36.5 C) (Oral)   Wt 201 lb (91.2 kg)   SpO2 98%   BMI 28.84 kg/m   Physical Exam  Constitutional: He is oriented to person, place, and time. He appears well-nourished. No distress.  HENT:  Head: Normocephalic and atraumatic.  Right Ear: External ear normal.  Left Ear: External ear normal.  Mouth/Throat: Oropharynx is clear and moist.  No middle ear effusion.    Neurological: He is alert and  oriented to person, place, and time. No cranial nerve deficit or sensory deficit. He exhibits normal muscle tone. Coordination normal.  Able to understand speech at normal tone.     ------------------------------------------------------------------------------------------------------------------------------------------------------------------------------------------------------------------- Assessment and Plan  Hearing difficulty Normal hearing screening today He will let me know if he notices any changes to hearing.

## 2018-04-19 ENCOUNTER — Encounter: Payer: Self-pay | Admitting: Family Medicine

## 2018-04-20 ENCOUNTER — Ambulatory Visit (INDEPENDENT_AMBULATORY_CARE_PROVIDER_SITE_OTHER): Payer: Medicare Other | Admitting: Psychology

## 2018-04-20 ENCOUNTER — Encounter (INDEPENDENT_AMBULATORY_CARE_PROVIDER_SITE_OTHER): Payer: Self-pay | Admitting: Physical Medicine and Rehabilitation

## 2018-04-20 DIAGNOSIS — F331 Major depressive disorder, recurrent, moderate: Secondary | ICD-10-CM | POA: Diagnosis not present

## 2018-04-20 DIAGNOSIS — F4310 Post-traumatic stress disorder, unspecified: Secondary | ICD-10-CM

## 2018-04-20 DIAGNOSIS — F200 Paranoid schizophrenia: Secondary | ICD-10-CM

## 2018-04-20 DIAGNOSIS — F411 Generalized anxiety disorder: Secondary | ICD-10-CM | POA: Diagnosis not present

## 2018-04-20 NOTE — Progress Notes (Signed)
Glenn Robertson - 52 y.o. male MRN [id number]  Date of birth: March 28, 1966  Office Visit Note: Visit Date: 04/04/2018 PCP: Luetta Nutting, DO Referred by: Leonard Downing, *  Subjective: Chief Complaint  Patient presents with  . Lower Back - Pain   HPI: Mr. Cuevas is a 52 year old gentleman that I last saw in June of this year and completed a right S1 transforaminal epidural steroid injection.  He was having more right hip and leg pain and buttock pain at the time.  The note unfortunately is dictated but we did a left S1 injection but we did in fact do a right S1 injection and this is confirmed with fluoroscopic imaging and shown to the patient today.  This was consistent that day with more right-sided pain.  Patient has a history of left L5-S1 herniated disc with disc fragment.  He has essentially had serial MRIs almost yearly.  There has been some improvement of the disc and disc fragment it has shrunk to a degree but is still present.  He is followed by Dr. Eduard Roux in our office.  His case is complicated by a history of schizoaffective disorder.  He comes in today with continued chronic back pain mostly on the right side that radiates into the right buttocks.  He reports that this started a year ago.  He has hired a Clinical research associate to help him with core strengthening and working out.  He reports that walking and jogging make things worse.  He does like to jog and he is trying to compete in some training program to be able to run further.  His average pain is a 4 out of 10.  He has had no focal weakness or bowel bladder changes or red flag complaints.  He reports prior injections seem to help the left side greatly even though we did do the right side.   Review of Systems  Constitutional: Negative for chills, fever, malaise/fatigue and weight loss.  HENT: Negative for hearing loss and sinus pain.   Eyes: Negative for blurred vision, double vision and photophobia.  Respiratory: Negative for cough and  shortness of breath.   Cardiovascular: Negative for chest pain, palpitations and leg swelling.  Gastrointestinal: Negative for abdominal pain, nausea and vomiting.  Genitourinary: Negative for flank pain.  Musculoskeletal: Positive for back pain. Negative for myalgias.  Skin: Negative for itching and rash.  Neurological: Negative for tremors, focal weakness and weakness.  Endo/Heme/Allergies: Negative.   Psychiatric/Behavioral: Negative for depression.  All other systems reviewed and are negative.  Otherwise per HPI.  Assessment & Plan: Visit Diagnoses: No diagnosis found.  Plan: Findings:  Chronic low back pain and buttock pain with history of left-sided disc herniation at L5-S1 which was fairly significant at one time with disc fragment.  Prior injections by Dr. Brien Few and patient was followed by Dr. Leana Gamer in our office at that time.  More recently he has had a right sided buttock pain followed by Dr. Eduard Roux.  He has had updated MRI not showing any focal compression on the right.  Right S1 transforaminal injection did not seem to help as much as he would hoped but he has gotten some relief over time.  We will have him continue with conservative care with his trainer with core strengthening and piriformis stretching.  In terms of any interventional injections one could consider piriformis injection at some point.  For now are going to see how he does know continue follow-up with Dr. Erlinda Hong.  Meds & Orders: No orders of the defined types were placed in this encounter.  No orders of the defined types were placed in this encounter.   Follow-up: Return if symptoms worsen or fail to improve.   Procedures: No procedures performed  No notes on file   Clinical History: MRI LUMBAR SPINE WITHOUT CONTRAST  TECHNIQUE: Multiplanar, multisequence MR imaging of the lumbar spine was performed. No intravenous contrast was administered.  COMPARISON:  02/16/2017  FINDINGS: Segmentation:   Standard.  Alignment:  Physiologic.  Vertebrae:  No fracture, evidence of discitis, or bone lesion.  Conus medullaris and cauda equina: Conus extends to the L1 level. Conus and cauda equina appear normal.  Paraspinal and other soft tissues: No acute paraspinal abnormality.  Other: Mild osteoarthritis of bilateral sacroiliac joints.  Disc levels:  Disc spaces: Degenerative disease with disc height loss at L5-S1.  T12-L1: No significant disc bulge. No evidence of neural foraminal stenosis. No central canal stenosis.  L1-L2: No significant disc bulge. No evidence of neural foraminal stenosis. No central canal stenosis.  L2-L3: Minimal broad-based disc bulge. No evidence of neural foraminal stenosis. No central canal stenosis.  L3-L4: Mild broad-based disc bulge. No evidence of neural foraminal stenosis. No central canal stenosis.  L4-L5: Mild broad-based disc bulge. No evidence of neural foraminal stenosis. No central canal stenosis.  L5-S1: Broad disc protrusion eccentric towards the left. Disc abuts bilateral S1 nerve roots. No evidence of neural foraminal stenosis. No central canal stenosis.  IMPRESSION: 1. At L5-S1 there is a broad disc protrusion eccentric towards the left with the disc abutting bilateral S1 nerve roots.   Electronically Signed   By: Kathreen Devoid   On: 11/25/2017 10:39   He reports that he has never smoked. He has never used smokeless tobacco.  Recent Labs    03/02/18 1451  HGBA1C 5.9*    Objective:  VS:  HT:    WT:   BMI:     BP:112/69  HR:85bpm  TEMP: ( )  RESP:  Physical Exam  Constitutional: He is oriented to person, place, and time. He appears well-developed and well-nourished. No distress.  HENT:  Head: Normocephalic and atraumatic.  Eyes: Pupils are equal, round, and reactive to light. Conjunctivae are normal.  Neck: Normal range of motion. Neck supple.  Cardiovascular: Regular rhythm and intact distal pulses.    Pulmonary/Chest: Effort normal. No respiratory distress.  Musculoskeletal:  Patient ambulates without aid with good distal strength.  He has no pain with hip rotation.  He has a negative Fortin finger sign but does have pain over the entire right buttock area.  No pain over the greater trochanter no pain with deep palpation or trigger points located in this area.  Neurological: He is alert and oriented to person, place, and time. He exhibits normal muscle tone. Coordination normal.  Skin: Skin is warm and dry. No rash noted. No erythema.  Psychiatric: He has a normal mood and affect.  Nursing note and vitals reviewed.   Ortho Exam Imaging: No results found.  Past Medical/Family/Surgical/Social History: Medications & Allergies reviewed per EMR, new medications updated. Patient Active Problem List   Diagnosis Date Noted  . Hearing difficulty 04/13/2018  . Trochanteric bursitis, right hip 03/23/2018  . Asthma 03/16/2018  . Headache 03/02/2018  . Type 2 diabetes mellitus without complication, without long-term current use of insulin ([place]) 02/16/2018  . B12 deficiency 02/16/2018  . Screening for colon cancer 02/16/2018  . Left wrist pain 07/12/2016  . Lumbosacral  radiculopathy at S1 03/15/2016  . Lumbar degenerative disc disease 12/18/2015  . Subacromial impingement of left shoulder 10/26/2015  . Left lumbar radiculitis 04/21/2015  . Disc displacement, lumbar 04/21/2015  . Chondromalacia of both patellae 02/24/2015  . Paranoid schizophrenia, chronic condition ([place]) 02/24/2015  . Low back pain 07/18/2014  . Knee pain, acute 07/18/2014  . Right ankle pain 01/27/2014  . Pain in joint, ankle and foot 01/27/2014  . Sciatica 01/10/2012  . Thoracic or lumbosacral neuritis or radiculitis, unspecified 12/15/2011  . Disturbance of skin sensation 11/14/2011   Past Medical History:  Diagnosis Date  . Allergy   . Asthma   . Chronic pain   . Depression   . Diabetes mellitus   .  Neuromuscular disorder ([place])    Family History  Problem Relation Age of Onset  . Diabetes Father   . Diabetes Paternal Uncle    Past Surgical History:  Procedure Laterality Date  . Arm Surgery  1/12   rt ulnar nerve decompression  . EPIDURAL BLOCK INJECTION     several  . ULNAR NERVE TRANSPOSITION  01/19/2012   Procedure: ULNAR NERVE DECOMPRESSION/TRANSPOSITION;  Surgeon: Cammie Sickle., MD;  Location: [facility];  Service: Orthopedics;  Laterality: Left;  ulnar nerve decompression vs transposition left elbow   Social History   Occupational History  . Not on file  Tobacco Use  . Smoking status: Never Smoker  . Smokeless tobacco: Never Used  Substance and Sexual Activity  . Alcohol use: No  . Drug use: No  . Sexual activity: Not on file

## 2018-04-24 ENCOUNTER — Telehealth: Payer: Self-pay

## 2018-04-24 NOTE — Telephone Encounter (Signed)
I have that he is only taking glipizide and metformin.  Given recent a1c of 5.9%, I don't think additional medication is needed.  If he is already taking this then I will go ahead and refill.

## 2018-04-24 NOTE — Telephone Encounter (Signed)
Pt is requesting ACTOS 30 mg that was prescribed by a different provider in the past. Made an appt on 8.28.19 for Dr Zigmund Daniel to refill this Rx.

## 2018-04-24 NOTE — Telephone Encounter (Signed)
Spoke with pt and he agreed with Dr Zigmund Daniel and does not want the rx refilled. I will cancel his appt tomorrow per request of pt.

## 2018-04-25 ENCOUNTER — Ambulatory Visit: Payer: Medicare Other | Admitting: Family Medicine

## 2018-04-25 ENCOUNTER — Ambulatory Visit: Payer: Self-pay | Admitting: Registered"

## 2018-05-03 ENCOUNTER — Ambulatory Visit (INDEPENDENT_AMBULATORY_CARE_PROVIDER_SITE_OTHER): Payer: Medicare Other | Admitting: Orthopaedic Surgery

## 2018-05-08 ENCOUNTER — Ambulatory Visit (INDEPENDENT_AMBULATORY_CARE_PROVIDER_SITE_OTHER): Payer: Medicare Other | Admitting: Physician Assistant

## 2018-05-08 ENCOUNTER — Encounter (INDEPENDENT_AMBULATORY_CARE_PROVIDER_SITE_OTHER): Payer: Self-pay | Admitting: Orthopaedic Surgery

## 2018-05-08 DIAGNOSIS — M25551 Pain in right hip: Secondary | ICD-10-CM | POA: Diagnosis not present

## 2018-05-08 NOTE — Progress Notes (Signed)
Office Visit Note   Patient: Glenn Robertson           Date of Birth: Apr 03, 1966           MRN: [id number] Visit Date: 05/08/2018              Requested by: Luetta Nutting, DO [place], [place] [address] PCP: Luetta Nutting, DO   Assessment & Plan: Visit Diagnoses:  1. Pain of right hip joint     Plan: Impression is right iliotibial band syndrome at the hip with questionable but less likely abductor tendon tear.  We will obtain an MRI with and without contrast to further assess the right hip joint.  He will follow-up with [place] once this is been completed.  Follow-Up Instructions: Return in about 2 weeks (around 05/22/2018) for mri review.   Orders:  No orders of the defined types were placed in this encounter.  No orders of the defined types were placed in this encounter.     Procedures: No procedures performed   Clinical Data: No additional findings.   Subjective: Chief Complaint  Patient presents with  . Right Hip - Pain    HPI patient is a 52 year old gentleman who presents to our clinic today with continued right hip pain.  This is been ongoing for quite some time.  He has a history of having pain to the right buttocks radiating down the lateral aspect and occasionally anterior aspect of the right thigh.  He does note that the majority of his pain is lateral aspect.  Worse with activity as he works out quite a bit with his Physiological scientist and does a lot of jogging.  He has been doing stretching, but has not really focused on the iliotibial band stretches.  He does have a history of a small broad-based disc bulge at L5-S1 which has been unchanged over the past few years.  He has seen Dr. Ernestina Patches for this.  Review of Systems as detailed in HPI.  All others reviewed and are negative.   Objective: Vital Signs: There were no vitals taken for this visit.  Physical Exam well-developed well-nourished gentleman no acute distress.  Alert and oriented  x3.  Ortho Exam examination of his right hip reveals negative logroll and negative straight leg raise.  Mild tenderness over the greater trochanter.  Full motion of the lumbar spine.  No focal deficits.  Specialty Comments:  No specialty comments available.  Imaging: No new imaging   PMFS History: Patient Active Problem List   Diagnosis Date Noted  . Pain of right hip joint 05/08/2018  . Hearing difficulty 04/13/2018  . Trochanteric bursitis, right hip 03/23/2018  . Asthma 03/16/2018  . Headache 03/02/2018  . Type 2 diabetes mellitus without complication, without long-term current use of insulin ([place]) 02/16/2018  . B12 deficiency 02/16/2018  . Screening for colon cancer 02/16/2018  . Left wrist pain 07/12/2016  . Lumbosacral radiculopathy at S1 03/15/2016  . Lumbar degenerative disc disease 12/18/2015  . Subacromial impingement of left shoulder 10/26/2015  . Left lumbar radiculitis 04/21/2015  . Disc displacement, lumbar 04/21/2015  . Chondromalacia of both patellae 02/24/2015  . Paranoid schizophrenia, chronic condition ([place]) 02/24/2015  . Low back pain 07/18/2014  . Knee pain, acute 07/18/2014  . Right ankle pain 01/27/2014  . Pain in joint, ankle and foot 01/27/2014  . Sciatica 01/10/2012  . Thoracic or lumbosacral neuritis or radiculitis, unspecified 12/15/2011  . Disturbance of skin sensation 11/14/2011  Past Medical History:  Diagnosis Date  . Allergy   . Asthma   . Chronic pain   . Depression   . Diabetes mellitus   . Neuromuscular disorder ([place])     Family History  Problem Relation Age of Onset  . Diabetes Father   . Diabetes Paternal Uncle     Past Surgical History:  Procedure Laterality Date  . Arm Surgery  1/12   rt ulnar nerve decompression  . EPIDURAL BLOCK INJECTION     several  . ULNAR NERVE TRANSPOSITION  01/19/2012   Procedure: ULNAR NERVE DECOMPRESSION/TRANSPOSITION;  Surgeon: Cammie Sickle., MD;  Location: [facility];   Service: Orthopedics;  Laterality: Left;  ulnar nerve decompression vs transposition left elbow   Social History   Occupational History  . Not on file  Tobacco Use  . Smoking status: Never Smoker  . Smokeless tobacco: Never Used  Substance and Sexual Activity  . Alcohol use: No  . Drug use: No  . Sexual activity: Not on file

## 2018-05-08 NOTE — Addendum Note (Signed)
Addended by: Precious Bard on: 05/08/2018 11:09 AM   Modules accepted: Orders

## 2018-05-09 ENCOUNTER — Encounter: Payer: Medicare Other | Attending: Family Medicine | Admitting: Registered"

## 2018-05-09 ENCOUNTER — Encounter: Payer: Self-pay | Admitting: Registered"

## 2018-05-09 DIAGNOSIS — Z713 Dietary counseling and surveillance: Secondary | ICD-10-CM | POA: Insufficient documentation

## 2018-05-09 DIAGNOSIS — E119 Type 2 diabetes mellitus without complications: Secondary | ICD-10-CM | POA: Diagnosis not present

## 2018-05-09 NOTE — Progress Notes (Signed)
Diabetes Self-Management Education  Visit Type: First/Initial  Appt. Start Time: 1410 Appt. End Time: 1540  05/09/2018  [name], identified by name and date of birth, is a 52 y.o. male with a diagnosis of Diabetes: Type 2.   ASSESSMENT Pt main concern with blood sugar is having it go too low, usually associated with exercise. Pt reports episodes of seizures where he "loses time", sits and stares according to what people around him observe and tell him. Pt states Dr. Welton Flakes believes it is related to hypoglycemia. RD encouraged patient to talk to his primary care about his symptoms, pt states he comes back into consciousness and has not checked BG to see how that might play into it, but he seems to get better without food intake. Pt reports his last "seizure" was last month when he went out to dinner with friends. Pt would benefit from having a CGM due to frequent hypoglycemic events. RD also provided the name of a product to discuss with his doctor, baqsimi, for glucagon treatment for severe hypoglycemia. This product is typically for insulin-induced hypoglycemia, however patient reports having BG in the 40's and sometimes will not have symptoms. The recent reduction in DM medications resolve this issue. RD will assess symptoms at next visit.   Pt states he is on disability and would like to work, but his mental health condition prevents it. Pt states he experiences high stress and he goes to the gym 4am to avoid crowds.  Pt states exercise helps him deal with stress, but he is careful because his BG goes down by 1/2 after exercising.  Pt states he left his extensive food diary at home, he wants to bring to next appointment. Pt states he created a form to track food after he went to a nutrition class 9 yrs ago and it helped him to cut out junk food and he lost weight. Pt states he started tracking food again for the last 1-2 months.  Pt reports weight cycling around life events such as  medication changes, travel out of the country, breaking his elbow leading to less exercise and stress eating, avoiding foods such as french fries and pizza. Pt states he is addicted to sugar, in the past has a hard time not eating an entire bag of cookies.   Pt denies diabetic neuropathy, and states the gabapentin is to treat back pain and sciatica. PT states his feet hurt if he stands still too long and likes to keep moving.  Pt states he wants to lose weight because he is tired of wearing XX shirts, hard to find and they cost more. Patient's typical breakfast does not provide enough energy for his size and activity level, could be contributing to his reported headaches. Pt states he does not eat vegetables daily, but does eat fruit daily. Pt states he stopped eating [employer] dinners and eating out daily, now closer to 1x week. Pt states he has a goal to eat fish 1x week, likes it and would probably eat it more but his mother doesn't want him to cook it in the house. Pt states he goes through a bag of ruffles in about 3 days (he first said last more than a week), also likes to snack on popcorn (freshly popped).   Sleep. Pt states his medication determines his sleep pattern. Pt states he falls asleep watching TV around 8 am and wakes up 2-3 am.  Diabetes Self-Management Education - 05/09/18 1422  Visit Information   Visit Type  First/Initial      Initial Visit   Diabetes Type  Type 2    Are you currently following a meal plan?  No    Are you taking your medications as prescribed?  Yes    Date Diagnosed  2002      Health Coping   How would you rate your overall health?  Fair      Psychosocial Assessment   Patient Belief/Attitude about Diabetes  Defeat/Burnout    How often do you need to have someone help you when you read instructions, pamphlets, or other written materials from your doctor or pharmacy?  1 - Never    What is the last grade level you completed in school?  BA       Complications   How often do you check your blood sugar?  3-4 times / week    Fasting Blood glucose range (mg/dL)  70-129    Have you had a dilated eye exam in the past 12 months?  Yes    Have you had a dental exam in the past 12 months?  Yes    Are you checking your feet?  No      Dietary Intake   Breakfast  prefers deli turky or ckn, 1 ww slice of bread, fruit OR [place] today, was out of food    7 am   Snack (morning)  none OR chips OR 1x week cashews    Lunch  rotisserie chicken on ww, vegetables OR country fried steak, corn bread, vegetables   before 11 am   Snack (afternoon)  popcorn, chips    Dinner  if eating early 2 pm - some sort of sandwich, vegetables or fruit    before 4 pm   Snack (evening)  none OR with medicine fruit or chips    Beverage(s)  water, powerade for working out      Exercise   Exercise Type  Strenuous (running)   3x week weight lifting; 3x week running/biking   How many days per week to you exercise?  6    How many minutes per day do you exercise?  60    Total minutes per week of exercise  360      Patient Education   Previous Diabetes Education  No    Nutrition management   Role of diet in the treatment of diabetes and the relationship between the three main macronutrients and blood glucose level    Physical activity and exercise   Role of exercise on diabetes management, blood pressure control and cardiac health.;Identified with patient nutritional and/or medication changes necessary with exercise.    Psychosocial adjustment  Role of stress on diabetes   sleep     Individualized Goals (developed by patient)   Nutrition  General guidelines for healthy choices and portions discussed    Reducing Risk  increase portions of nuts and seeds      Outcomes   Expected Outcomes  Demonstrated interest in learning. Expect positive outcomes    Future DMSE  4-6 wks    Program Status  Not Completed      Individualized Plan for Diabetes Self-Management  Training:   Learning Objective:  Patient will have a greater understanding of diabetes self-management. Patient education plan is to attend individual and/or group sessions per assessed needs and concerns.   Patient Instructions  Continue your exercise as tolerated, watching your blood sugar and have snacks to help keep from  going too low. Consider register for the non-hunger eating class, may help you understand how to listen to your body. Consider aiming to get vegetables in daily. Whenever eating fruit also eat protein, such as nuts Consider trying some sleep tips to get more hours and better quality sleep, having a bedtime routine and not looking at screens may be very helpful. Consider talking to your doctor about your seizure symptoms.  Expected Outcomes:  Demonstrated interest in learning. Expect positive outcomes  Education material provided: My Plate and Snack sheet, sleep hygiene, freestyle libre brochure  If problems or questions, patient to contact team via:  Phone  Future DSME appointment: 4-6 wks

## 2018-05-09 NOTE — Patient Instructions (Signed)
Continue your exercise as tolerated, watching your blood sugar and have snacks to help keep from going too low. Consider register for the non-hunger eating class, may help you understand how to listen to your body. Consider aiming to get vegetables in daily. Whenever eating fruit also eat protein, such as nuts Consider trying some sleep tips to get more hours and better quality sleep, having a bedtime routine and not looking at screens may be very helpful. Consider talking to your doctor about your seizure symptoms.

## 2018-05-18 ENCOUNTER — Ambulatory Visit: Payer: Medicare Other | Admitting: Psychology

## 2018-05-18 ENCOUNTER — Ambulatory Visit
Admission: RE | Admit: 2018-05-18 | Discharge: 2018-05-18 | Disposition: A | Discharge status: Home / Self Care | Payer: Medicare Other | Source: Ambulatory Visit | Attending: Orthopaedic Surgery | Admitting: Orthopaedic Surgery

## 2018-05-18 DIAGNOSIS — M25551 Pain in right hip: Secondary | ICD-10-CM | POA: Diagnosis not present

## 2018-05-22 ENCOUNTER — Encounter (INDEPENDENT_AMBULATORY_CARE_PROVIDER_SITE_OTHER): Payer: Self-pay | Admitting: Orthopaedic Surgery

## 2018-05-22 ENCOUNTER — Ambulatory Visit (INDEPENDENT_AMBULATORY_CARE_PROVIDER_SITE_OTHER): Payer: Medicare Other | Admitting: Orthopaedic Surgery

## 2018-05-22 DIAGNOSIS — M67951 Unspecified disorder of synovium and tendon, right thigh: Secondary | ICD-10-CM

## 2018-05-22 DIAGNOSIS — M6798 Unspecified disorder of synovium and tendon, other site: Secondary | ICD-10-CM

## 2018-05-22 MED ORDER — MELOXICAM 7.5 MG PO TABS: 7.5000 mg | ORAL_TABLET | Freq: Every day | ORAL | 2 refills | Status: DC | PRN

## 2018-05-22 NOTE — Progress Notes (Signed)
Patient: Glenn Robertson           Date of Birth: Oct 22, 1965           MRN: [id number] Visit Date: 05/22/2018 PCP: Luetta Nutting, DO   Assessment & Plan:  Chief Complaint:  Chief Complaint  Patient presents with  . Right Hip - Pain   Visit Diagnoses:  1. Tendinopathy of right gluteus medius     Plan: Patient is a 52 year old gentleman who presents to our clinic today to discuss MRI results of the right hip.  Patient is been experiencing right hip pain for about a year.  This is all been to the posterior lateral aspect.  Worse with activity.  MRI of the lumbar spine was negative for right-sided pathology.  He did have L5-S1 disc protrusion on the left which was injected by Dr. Ernestina Patches.  This significantly helped.  Recent MRI of the right hip was essentially negative.  This showed a little tendinopathy to the gluteus but nothing more.  He has tried formal physical therapy for 4 weeks back in April as well as oral anti-inflammatories.  He continues to work out which seems to aggravate his issue.  At this point, we will send the patient to formal physical therapy for a longer period of time.  A prescription was given to the patient.  I have also called in Mobic.  He will back off all activities for now.  He will follow-up with [place] as needed.  Follow-Up Instructions: Return if symptoms worsen or fail to improve.   Orders:  No orders of the defined types were placed in this encounter.  Meds ordered this encounter  Medications  . meloxicam (MOBIC) 7.5 MG tablet    Sig: Take 1 tablet (7.5 mg total) by mouth daily as needed for up to 14 doses for pain.    Dispense:  30 tablet    Refill:  2    Imaging: No results found.  PMFS History: Patient Active Problem List   Diagnosis Date Noted  . Tendinopathy of right gluteus medius 05/22/2018  . Pain of right hip joint 05/08/2018  . Hearing difficulty 04/13/2018  . Trochanteric bursitis, right hip 03/23/2018  . Asthma 03/16/2018  .  Headache 03/02/2018  . Type 2 diabetes mellitus without complication, without long-term current use of insulin ([place]) 02/16/2018  . B12 deficiency 02/16/2018  . Screening for colon cancer 02/16/2018  . Left wrist pain 07/12/2016  . Lumbosacral radiculopathy at S1 03/15/2016  . Lumbar degenerative disc disease 12/18/2015  . Subacromial impingement of left shoulder 10/26/2015  . Left lumbar radiculitis 04/21/2015  . Disc displacement, lumbar 04/21/2015  . Chondromalacia of both patellae 02/24/2015  . Paranoid schizophrenia, chronic condition ([place]) 02/24/2015  . Low back pain 07/18/2014  . Knee pain, acute 07/18/2014  . Right ankle pain 01/27/2014  . Pain in joint, ankle and foot 01/27/2014  . Sciatica 01/10/2012  . Thoracic or lumbosacral neuritis or radiculitis, unspecified 12/15/2011  . Disturbance of skin sensation 11/14/2011   Past Medical History:  Diagnosis Date  . Allergy   . Asthma   . Chronic pain   . Depression   . Diabetes mellitus   . Neuromuscular disorder ([place])     Family History  Problem Relation Age of Onset  . Diabetes Father   . Diabetes Paternal Uncle     Past Surgical History:  Procedure Laterality Date  . Arm Surgery  1/12   rt ulnar nerve decompression  . EPIDURAL  BLOCK INJECTION     several  . ULNAR NERVE TRANSPOSITION  01/19/2012   Procedure: ULNAR NERVE DECOMPRESSION/TRANSPOSITION;  Surgeon: Cammie Sickle., MD;  Location: [place];  Service: Orthopedics;  Laterality: Left;  ulnar nerve decompression vs transposition left elbow   Social History   Occupational History  . Not on file  Tobacco Use  . Smoking status: Never Smoker  . Smokeless tobacco: Never Used  Substance and Sexual Activity  . Alcohol use: No  . Drug use: No  . Sexual activity: Not on file

## 2018-05-25 DIAGNOSIS — F310 Bipolar disorder, current episode hypomanic: Secondary | ICD-10-CM | POA: Diagnosis not present

## 2018-05-25 DIAGNOSIS — Z79899 Other long term (current) drug therapy: Secondary | ICD-10-CM | POA: Diagnosis not present

## 2018-05-28 DIAGNOSIS — M25651 Stiffness of right hip, not elsewhere classified: Secondary | ICD-10-CM | POA: Diagnosis not present

## 2018-05-28 DIAGNOSIS — M545 Low back pain: Secondary | ICD-10-CM | POA: Diagnosis not present

## 2018-05-28 DIAGNOSIS — M25551 Pain in right hip: Secondary | ICD-10-CM | POA: Diagnosis not present

## 2018-05-28 DIAGNOSIS — M2560 Stiffness of unspecified joint, not elsewhere classified: Secondary | ICD-10-CM | POA: Diagnosis not present

## 2018-05-29 ENCOUNTER — Ambulatory Visit (INDEPENDENT_AMBULATORY_CARE_PROVIDER_SITE_OTHER): Payer: Medicare Other | Admitting: Psychology

## 2018-05-29 DIAGNOSIS — F4310 Post-traumatic stress disorder, unspecified: Secondary | ICD-10-CM

## 2018-05-29 DIAGNOSIS — F200 Paranoid schizophrenia: Secondary | ICD-10-CM | POA: Diagnosis not present

## 2018-05-29 DIAGNOSIS — F331 Major depressive disorder, recurrent, moderate: Secondary | ICD-10-CM | POA: Diagnosis not present

## 2018-05-29 DIAGNOSIS — F410 Panic disorder [episodic paroxysmal anxiety] without agoraphobia: Secondary | ICD-10-CM | POA: Diagnosis not present

## 2018-05-29 DIAGNOSIS — Z79899 Other long term (current) drug therapy: Secondary | ICD-10-CM | POA: Diagnosis not present

## 2018-05-29 DIAGNOSIS — F411 Generalized anxiety disorder: Secondary | ICD-10-CM

## 2018-05-30 DIAGNOSIS — M25651 Stiffness of right hip, not elsewhere classified: Secondary | ICD-10-CM | POA: Diagnosis not present

## 2018-05-30 DIAGNOSIS — M545 Low back pain: Secondary | ICD-10-CM | POA: Diagnosis not present

## 2018-05-30 DIAGNOSIS — M2560 Stiffness of unspecified joint, not elsewhere classified: Secondary | ICD-10-CM | POA: Diagnosis not present

## 2018-05-30 DIAGNOSIS — M25551 Pain in right hip: Secondary | ICD-10-CM | POA: Diagnosis not present

## 2018-06-01 DIAGNOSIS — M2560 Stiffness of unspecified joint, not elsewhere classified: Secondary | ICD-10-CM | POA: Diagnosis not present

## 2018-06-01 DIAGNOSIS — M545 Low back pain: Secondary | ICD-10-CM | POA: Diagnosis not present

## 2018-06-01 DIAGNOSIS — M25551 Pain in right hip: Secondary | ICD-10-CM | POA: Diagnosis not present

## 2018-06-01 DIAGNOSIS — M25651 Stiffness of right hip, not elsewhere classified: Secondary | ICD-10-CM | POA: Diagnosis not present

## 2018-06-04 DIAGNOSIS — M25651 Stiffness of right hip, not elsewhere classified: Secondary | ICD-10-CM | POA: Diagnosis not present

## 2018-06-04 DIAGNOSIS — M545 Low back pain: Secondary | ICD-10-CM | POA: Diagnosis not present

## 2018-06-04 DIAGNOSIS — M2560 Stiffness of unspecified joint, not elsewhere classified: Secondary | ICD-10-CM | POA: Diagnosis not present

## 2018-06-04 DIAGNOSIS — M25551 Pain in right hip: Secondary | ICD-10-CM | POA: Diagnosis not present

## 2018-06-06 DIAGNOSIS — M25551 Pain in right hip: Secondary | ICD-10-CM | POA: Diagnosis not present

## 2018-06-06 DIAGNOSIS — M2560 Stiffness of unspecified joint, not elsewhere classified: Secondary | ICD-10-CM | POA: Diagnosis not present

## 2018-06-06 DIAGNOSIS — M25651 Stiffness of right hip, not elsewhere classified: Secondary | ICD-10-CM | POA: Diagnosis not present

## 2018-06-06 DIAGNOSIS — M545 Low back pain: Secondary | ICD-10-CM | POA: Diagnosis not present

## 2018-06-08 DIAGNOSIS — M2560 Stiffness of unspecified joint, not elsewhere classified: Secondary | ICD-10-CM | POA: Diagnosis not present

## 2018-06-08 DIAGNOSIS — M545 Low back pain: Secondary | ICD-10-CM | POA: Diagnosis not present

## 2018-06-08 DIAGNOSIS — M25651 Stiffness of right hip, not elsewhere classified: Secondary | ICD-10-CM | POA: Diagnosis not present

## 2018-06-08 DIAGNOSIS — M25551 Pain in right hip: Secondary | ICD-10-CM | POA: Diagnosis not present

## 2018-06-09 DIAGNOSIS — Z23 Encounter for immunization: Secondary | ICD-10-CM | POA: Diagnosis not present

## 2018-06-11 DIAGNOSIS — M545 Low back pain: Secondary | ICD-10-CM | POA: Diagnosis not present

## 2018-06-11 DIAGNOSIS — M2560 Stiffness of unspecified joint, not elsewhere classified: Secondary | ICD-10-CM | POA: Diagnosis not present

## 2018-06-11 DIAGNOSIS — M25651 Stiffness of right hip, not elsewhere classified: Secondary | ICD-10-CM | POA: Diagnosis not present

## 2018-06-11 DIAGNOSIS — M25551 Pain in right hip: Secondary | ICD-10-CM | POA: Diagnosis not present

## 2018-06-13 DIAGNOSIS — M25551 Pain in right hip: Secondary | ICD-10-CM | POA: Diagnosis not present

## 2018-06-13 DIAGNOSIS — M2560 Stiffness of unspecified joint, not elsewhere classified: Secondary | ICD-10-CM | POA: Diagnosis not present

## 2018-06-13 DIAGNOSIS — M545 Low back pain: Secondary | ICD-10-CM | POA: Diagnosis not present

## 2018-06-13 DIAGNOSIS — M25651 Stiffness of right hip, not elsewhere classified: Secondary | ICD-10-CM | POA: Diagnosis not present

## 2018-06-15 DIAGNOSIS — M25651 Stiffness of right hip, not elsewhere classified: Secondary | ICD-10-CM | POA: Diagnosis not present

## 2018-06-15 DIAGNOSIS — M545 Low back pain: Secondary | ICD-10-CM | POA: Diagnosis not present

## 2018-06-15 DIAGNOSIS — M2560 Stiffness of unspecified joint, not elsewhere classified: Secondary | ICD-10-CM | POA: Diagnosis not present

## 2018-06-15 DIAGNOSIS — M25551 Pain in right hip: Secondary | ICD-10-CM | POA: Diagnosis not present

## 2018-06-18 ENCOUNTER — Encounter: Payer: Self-pay | Admitting: Family Medicine

## 2018-06-18 ENCOUNTER — Ambulatory Visit (INDEPENDENT_AMBULATORY_CARE_PROVIDER_SITE_OTHER): Payer: Medicare Other | Admitting: Family Medicine

## 2018-06-18 ENCOUNTER — Ambulatory Visit: Payer: Self-pay | Admitting: Family Medicine

## 2018-06-18 VITALS — BP 110/72 | HR 86 | Temp 97.6°F | Ht 70.0 in | Wt 204.0 lb

## 2018-06-18 DIAGNOSIS — M25532 Pain in left wrist: Secondary | ICD-10-CM

## 2018-06-18 DIAGNOSIS — M2560 Stiffness of unspecified joint, not elsewhere classified: Secondary | ICD-10-CM | POA: Diagnosis not present

## 2018-06-18 DIAGNOSIS — M545 Low back pain: Secondary | ICD-10-CM | POA: Diagnosis not present

## 2018-06-18 DIAGNOSIS — E119 Type 2 diabetes mellitus without complications: Secondary | ICD-10-CM

## 2018-06-18 DIAGNOSIS — M25551 Pain in right hip: Secondary | ICD-10-CM | POA: Diagnosis not present

## 2018-06-18 DIAGNOSIS — M25651 Stiffness of right hip, not elsewhere classified: Secondary | ICD-10-CM | POA: Diagnosis not present

## 2018-06-18 LAB — POCT GLYCOSYLATED HEMOGLOBIN (HGB A1C): HEMOGLOBIN A1C: 6.7 % — AB (ref 4.0–5.6)
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
## 2018-06-18 NOTE — Assessment & Plan Note (Signed)
-  Update a1c today and will make adjustments to medications as needed -Instructed to follow low carb diet -Continue active lifestyle.
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
## 2018-06-18 NOTE — Patient Instructions (Signed)
We'll be in touch with lab results Let me know when you are ready to schedule your colonoscopy.

## 2018-06-19 ENCOUNTER — Ambulatory Visit: Payer: Self-pay | Admitting: Registered"
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
## 2018-06-20 ENCOUNTER — Ambulatory Visit (INDEPENDENT_AMBULATORY_CARE_PROVIDER_SITE_OTHER): Payer: Medicare Other

## 2018-06-20 ENCOUNTER — Ambulatory Visit (INDEPENDENT_AMBULATORY_CARE_PROVIDER_SITE_OTHER): Payer: Medicare Other | Admitting: Family Medicine

## 2018-06-20 ENCOUNTER — Encounter: Payer: Self-pay | Admitting: Family Medicine

## 2018-06-20 VITALS — BP 128/84 | HR 88 | Ht 70.0 in | Wt 204.0 lb

## 2018-06-20 DIAGNOSIS — M25532 Pain in left wrist: Secondary | ICD-10-CM

## 2018-06-20 DIAGNOSIS — M545 Low back pain: Secondary | ICD-10-CM | POA: Diagnosis not present

## 2018-06-20 DIAGNOSIS — M2560 Stiffness of unspecified joint, not elsewhere classified: Secondary | ICD-10-CM | POA: Diagnosis not present

## 2018-06-20 DIAGNOSIS — M25651 Stiffness of right hip, not elsewhere classified: Secondary | ICD-10-CM | POA: Diagnosis not present

## 2018-06-20 DIAGNOSIS — M25551 Pain in right hip: Secondary | ICD-10-CM | POA: Diagnosis not present
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
## 2018-06-20 NOTE — Patient Instructions (Signed)
Nice to meet you  Please try the exercises Please try to avoid activities that cause pain  Please try the medicine for pain. I can give you more medicine if it helps  Please follow up with me in 4-6 weeks if your symptoms don't improve.

## 2018-06-25 DIAGNOSIS — M25651 Stiffness of right hip, not elsewhere classified: Secondary | ICD-10-CM | POA: Diagnosis not present

## 2018-06-25 DIAGNOSIS — M2560 Stiffness of unspecified joint, not elsewhere classified: Secondary | ICD-10-CM | POA: Diagnosis not present

## 2018-06-25 DIAGNOSIS — M545 Low back pain: Secondary | ICD-10-CM | POA: Diagnosis not present

## 2018-06-25 DIAGNOSIS — M25551 Pain in right hip: Secondary | ICD-10-CM | POA: Diagnosis not present

## 2018-06-27 ENCOUNTER — Ambulatory Visit (INDEPENDENT_AMBULATORY_CARE_PROVIDER_SITE_OTHER): Payer: Medicare Other | Admitting: Psychology

## 2018-06-27 DIAGNOSIS — F331 Major depressive disorder, recurrent, moderate: Secondary | ICD-10-CM | POA: Diagnosis not present

## 2018-06-27 DIAGNOSIS — M25551 Pain in right hip: Secondary | ICD-10-CM | POA: Diagnosis not present

## 2018-06-27 DIAGNOSIS — M545 Low back pain: Secondary | ICD-10-CM | POA: Diagnosis not present

## 2018-06-27 DIAGNOSIS — F411 Generalized anxiety disorder: Secondary | ICD-10-CM | POA: Diagnosis not present

## 2018-06-27 DIAGNOSIS — F4310 Post-traumatic stress disorder, unspecified: Secondary | ICD-10-CM

## 2018-06-27 DIAGNOSIS — F200 Paranoid schizophrenia: Secondary | ICD-10-CM

## 2018-06-27 DIAGNOSIS — M25651 Stiffness of right hip, not elsewhere classified: Secondary | ICD-10-CM | POA: Diagnosis not present

## 2018-06-27 DIAGNOSIS — M2560 Stiffness of unspecified joint, not elsewhere classified: Secondary | ICD-10-CM | POA: Diagnosis not present

## 2018-07-02 DIAGNOSIS — M2560 Stiffness of unspecified joint, not elsewhere classified: Secondary | ICD-10-CM | POA: Diagnosis not present

## 2018-07-02 DIAGNOSIS — M25551 Pain in right hip: Secondary | ICD-10-CM | POA: Diagnosis not present

## 2018-07-02 DIAGNOSIS — M25651 Stiffness of right hip, not elsewhere classified: Secondary | ICD-10-CM | POA: Diagnosis not present

## 2018-07-02 DIAGNOSIS — M545 Low back pain: Secondary | ICD-10-CM | POA: Diagnosis not present

## 2018-07-04 DIAGNOSIS — M25551 Pain in right hip: Secondary | ICD-10-CM | POA: Diagnosis not present

## 2018-07-04 DIAGNOSIS — M545 Low back pain: Secondary | ICD-10-CM | POA: Diagnosis not present

## 2018-07-04 DIAGNOSIS — M2560 Stiffness of unspecified joint, not elsewhere classified: Secondary | ICD-10-CM | POA: Diagnosis not present

## 2018-07-04 DIAGNOSIS — M25651 Stiffness of right hip, not elsewhere classified: Secondary | ICD-10-CM | POA: Diagnosis not present

## 2018-07-09 DIAGNOSIS — M2560 Stiffness of unspecified joint, not elsewhere classified: Secondary | ICD-10-CM | POA: Diagnosis not present

## 2018-07-09 DIAGNOSIS — M25551 Pain in right hip: Secondary | ICD-10-CM | POA: Diagnosis not present

## 2018-07-09 DIAGNOSIS — M545 Low back pain: Secondary | ICD-10-CM | POA: Diagnosis not present

## 2018-07-09 DIAGNOSIS — M25651 Stiffness of right hip, not elsewhere classified: Secondary | ICD-10-CM | POA: Diagnosis not present

## 2018-07-11 DIAGNOSIS — M25551 Pain in right hip: Secondary | ICD-10-CM | POA: Diagnosis not present

## 2018-07-11 DIAGNOSIS — M2560 Stiffness of unspecified joint, not elsewhere classified: Secondary | ICD-10-CM | POA: Diagnosis not present

## 2018-07-11 DIAGNOSIS — M545 Low back pain: Secondary | ICD-10-CM | POA: Diagnosis not present

## 2018-07-11 DIAGNOSIS — M25651 Stiffness of right hip, not elsewhere classified: Secondary | ICD-10-CM | POA: Diagnosis not present

## 2018-07-12 ENCOUNTER — Ambulatory Visit (INDEPENDENT_AMBULATORY_CARE_PROVIDER_SITE_OTHER): Payer: Medicare Other | Admitting: Orthopaedic Surgery

## 2018-07-12 ENCOUNTER — Encounter (INDEPENDENT_AMBULATORY_CARE_PROVIDER_SITE_OTHER): Payer: Self-pay | Admitting: Orthopaedic Surgery

## 2018-07-12 ENCOUNTER — Ambulatory Visit (INDEPENDENT_AMBULATORY_CARE_PROVIDER_SITE_OTHER): Payer: Medicare Other

## 2018-07-12 DIAGNOSIS — M5417 Radiculopathy, lumbosacral region: Secondary | ICD-10-CM | POA: Diagnosis not present

## 2018-07-12 DIAGNOSIS — M79605 Pain in left leg: Secondary | ICD-10-CM

## 2018-07-12 DIAGNOSIS — M25561 Pain in right knee: Secondary | ICD-10-CM

## 2018-07-12 NOTE — Progress Notes (Signed)
Office Visit Note   Patient: Glenn Robertson           Date of Birth: 09-Sep-1965           MRN: [id number] Visit Date: 07/12/2018              Requested by: Luetta Nutting, DO [place], [place] [address] PCP: Luetta Nutting, DO   Assessment & Plan: Visit Diagnoses:  1. Right knee pain, unspecified chronicity   2. Pain in left leg   3. Lumbosacral radiculopathy at S1     Plan: Impression is left lower extremity pain and right knee chondromalacia patella.  In regards to the left lower extremity pain, we will refer the patient back to Dr. Ernestina Patches for a possible ESI versus SI joint injection.  In the meantime, he will continue with physical therapy to work on hamstring and core strengthening.  In regards to the right knee, reassurance was given.  If this does become symptomatic he will follow-up with [place].  Continue working on quad strengthening exercises.  Follow-Up Instructions: Return if symptoms worsen or fail to improve.   Orders:  Orders Placed This Encounter  Procedures  . XR KNEE 3 VIEW RIGHT  . Ambulatory referral to Physical Medicine Rehab   No orders of the defined types were placed in this encounter.     Procedures: No procedures performed   Clinical Data: No additional findings.   Subjective: Chief Complaint  Patient presents with  . Right Knee - Pain  . left buttock, posterior thigh pain    HPI Glenn Robertson is a 52 year old gentleman who presents to our clinic today with recurrent left buttocks and posterior thigh pain as well as concerns about his right knee popping.  In regards to the left lower extremity, he has had a remote MRI from March 2019 which showed a broad disc protrusion at L5-S1 towards the left.  He has previously been seen by Dr. Ernestina Patches for a right hip SI joint injection but not one on the left.  The pain he is having on the left really only occurs when he is standing for too long or if he is walking too slow.  He denies any groin or  anterior thigh pain.  In regards to the right knee, he is recently noticed popping when flexing the knee.  This does not appear to be painful.  No other symptoms of the right knee.  Review of Systems as detailed in HPI.  All others reviewed and are negative.   Objective: Vital Signs: There were no vitals taken for this visit.  Physical Exam well-developed well-nourished gentleman no acute distress.  Alert and oriented x3.  Ortho Exam examination of the left lower extremity reveals negative logroll negative straight leg raise.  Full painless motion of the lumbar spine.  No focal area of pain.  Right knee exam shows no effusion.  Range of motion 0 to 130 degrees.  No joint line tenderness.  Mild patellofemoral crepitus.  Stable to valgus varus stress.  Neurovascularly intact distally.  Specialty Comments:  No specialty comments available.  Imaging: Xr Knee 3 View Right  Result Date: 07/12/2018 X-rays of the right knee show mild degenerative changes medial and patellofemoral compartment    PMFS History: Patient Active Problem List   Diagnosis Date Noted  . Tendinopathy of right gluteus medius 05/22/2018  . Pain of right hip joint 05/08/2018  . Hearing difficulty 04/13/2018  . Trochanteric bursitis, right hip 03/23/2018  .  Asthma 03/16/2018  . Headache 03/02/2018  . Type 2 diabetes mellitus without complication, without long-term current use of insulin ([place]) 02/16/2018  . B12 deficiency 02/16/2018  . Screening for colon cancer 02/16/2018  . Pain in left leg 10/28/2016  . Left wrist pain 07/12/2016  . Lumbosacral radiculopathy at S1 03/15/2016  . Lumbar degenerative disc disease 12/18/2015  . Subacromial impingement of left shoulder 10/26/2015  . Left lumbar radiculitis 04/21/2015  . Disc displacement, lumbar 04/21/2015  . Chondromalacia of both patellae 02/24/2015  . Paranoid schizophrenia, chronic condition ([place]) 02/24/2015  . Low back pain 07/18/2014  . Right knee pain  07/18/2014  . Right ankle pain 01/27/2014  . Pain in joint, ankle and foot 01/27/2014  . Sciatica 01/10/2012  . Thoracic or lumbosacral neuritis or radiculitis, unspecified 12/15/2011  . Disturbance of skin sensation 11/14/2011   Past Medical History:  Diagnosis Date  . Allergy   . Asthma   . Chronic pain   . Depression   . Diabetes mellitus   . Neuromuscular disorder ([place])     Family History  Problem Relation Age of Onset  . Diabetes Father   . Diabetes Paternal Uncle     Past Surgical History:  Procedure Laterality Date  . Arm Surgery  1/12   rt ulnar nerve decompression  . EPIDURAL BLOCK INJECTION     several  . ULNAR NERVE TRANSPOSITION  01/19/2012   Procedure: ULNAR NERVE DECOMPRESSION/TRANSPOSITION;  Surgeon: Cammie Sickle., MD;  Location: [place];  Service: Orthopedics;  Laterality: Left;  ulnar nerve decompression vs transposition left elbow   Social History   Occupational History  . Not on file  Tobacco Use  . Smoking status: Never Smoker  . Smokeless tobacco: Never Used  Substance and Sexual Activity  . Alcohol use: No  . Drug use: No  . Sexual activity: Not on file

## 2018-07-17 DIAGNOSIS — M25651 Stiffness of right hip, not elsewhere classified: Secondary | ICD-10-CM | POA: Diagnosis not present

## 2018-07-17 DIAGNOSIS — M2560 Stiffness of unspecified joint, not elsewhere classified: Secondary | ICD-10-CM | POA: Diagnosis not present

## 2018-07-17 DIAGNOSIS — M545 Low back pain: Secondary | ICD-10-CM | POA: Diagnosis not present

## 2018-07-17 DIAGNOSIS — M25551 Pain in right hip: Secondary | ICD-10-CM | POA: Diagnosis not present

## 2018-08-07 ENCOUNTER — Ambulatory Visit (INDEPENDENT_AMBULATORY_CARE_PROVIDER_SITE_OTHER): Payer: Medicare Other | Admitting: Psychology

## 2018-08-07 DIAGNOSIS — F331 Major depressive disorder, recurrent, moderate: Secondary | ICD-10-CM | POA: Diagnosis not present

## 2018-08-07 DIAGNOSIS — F411 Generalized anxiety disorder: Secondary | ICD-10-CM | POA: Diagnosis not present

## 2018-08-08 ENCOUNTER — Ambulatory Visit (INDEPENDENT_AMBULATORY_CARE_PROVIDER_SITE_OTHER): Payer: Medicare Other | Admitting: Family Medicine

## 2018-08-08 ENCOUNTER — Encounter: Payer: Self-pay | Admitting: Family Medicine

## 2018-08-08 VITALS — BP 138/74 | HR 102 | Temp 98.7°F | Ht 70.0 in | Wt 203.0 lb

## 2018-08-08 DIAGNOSIS — R404 Transient alteration of awareness: Secondary | ICD-10-CM

## 2018-08-08 MED ORDER — FLUTICASONE PROPIONATE 50 MCG/ACT NA SUSP: 2.0000 | Freq: Every day | NASAL | 6 refills | Status: DC

## 2018-08-08 NOTE — Assessment & Plan Note (Signed)
-?   Partial seizures, referral placed to neurology.  -recommend 81mg  asa given other co-morbidities.

## 2018-08-08 NOTE — Patient Instructions (Signed)
-  Start 81mg  aspirin daily -You will receive a call to arrange for neurology appointment

## 2018-08-08 NOTE — Progress Notes (Signed)
Glenn Robertson - 52 y.o. male MRN [id number]  Date of birth: [birth year]  Subjective Chief Complaint  Patient presents with  . other    has been having mental "blanks"  or TIA feels like he can't see or speak when they occur-first occurance was in 2012. Denies headaches or photophobia    HPI Glenn Robertson is a 52 y.o. male with history Schizoaffective d/o and T2DM here today with concern of having episodes where he blanks out.  He doesn't think that he loses consciousness during these episodes but has been told that he will freeze and stare off for several seconds.  He does not recall these episodes.  He first noticed these in 2012 and has had a few witnessed episodes since that time.  The last witnessed episode was earlier this year.  He is unsure if he has had further episodes as he doesn't remember these when they occur.  He is concerned that these may be TIA episodes.  He denies LOC or weakness associated with this.   ROS:  A comprehensive ROS was completed and negative except as noted per HPI  Allergies  Allergen Reactions  . Citric Acid Other (See Comments)    Runny nose, eyes water  . Food     Oranges or anything with citric acid    Past Medical History:  Diagnosis Date  . Allergy   . Asthma   . Chronic pain   . Depression   . Diabetes mellitus   . Neuromuscular disorder [facility])     Past Surgical History:  Procedure Laterality Date  . Arm Surgery  1/12   rt ulnar nerve decompression  . EPIDURAL BLOCK INJECTION     several  . ULNAR NERVE TRANSPOSITION  01/19/2012   Procedure: ULNAR NERVE DECOMPRESSION/TRANSPOSITION;  Surgeon: Cammie Sickle., MD;  Location: [place];  Service: Orthopedics;  Laterality: Left;  ulnar nerve decompression vs transposition left elbow    Social History   Socioeconomic History  . Marital status: Single    Spouse name: Not on file  . Number of children: Not on file  . Years of education: Not on file  . Highest education  level: Not on file  Occupational History  . Not on file  Social Needs  . Financial resource strain: Not on file  . Food insecurity:    Worry: Not on file    Inability: Not on file  . Transportation needs:    Medical: Not on file    Non-medical: Not on file  Tobacco Use  . Smoking status: Never Smoker  . Smokeless tobacco: Never Used  Substance and Sexual Activity  . Alcohol use: No  . Drug use: No  . Sexual activity: Not on file  Lifestyle  . Physical activity:    Days per week: Not on file    Minutes per session: Not on file  . Stress: Not on file  Relationships  . Social connections:    Talks on phone: Not on file    Gets together: Not on file    Attends religious service: Not on file    Active member of club or organization: Not on file    Attends meetings of clubs or organizations: Not on file    Relationship status: Not on file  Other Topics Concern  . Not on file  Social History Narrative  . Not on file    Family History  Problem Relation Age of Onset  . Diabetes Father   .  Diabetes Paternal Uncle     Health Maintenance  Topic Date Due  . PNEUMOCOCCAL POLYSACCHARIDE VACCINE AGE 43-64 HIGH RISK  09/02/1968  . FOOT EXAM  09/02/1976  . HIV Screening  09/02/1981  . TETANUS/TDAP  09/02/1985  . COLONOSCOPY  09/02/2016  . HEMOGLOBIN A1C  12/18/2018  . URINE MICROALBUMIN  03/03/2019  . OPHTHALMOLOGY EXAM  05/03/2019  . INFLUENZA VACCINE  Completed    ----------------------------------------------------------------------------------------------------------------------------------------------------------------------------------------------------------------- Physical Exam BP 138/74   Pulse (!) 102   Temp 98.7 F (37.1 C) (Oral)   Ht 5\' 10"  (1.778 m)   Wt 203 lb (92.1 kg)   SpO2 97%   BMI 29.13 kg/m   Physical Exam  Constitutional: He is oriented to person, place, and time. He appears well-nourished. No distress.  HENT:  Head: Normocephalic and  atraumatic.  Mouth/Throat: Oropharynx is clear and moist.  Eyes: No scleral icterus.  Cardiovascular: Normal rate, regular rhythm and normal heart sounds.  Pulmonary/Chest: Effort normal and breath sounds normal.  Neurological: He is alert and oriented to person, place, and time. No cranial nerve deficit. Coordination normal.  Skin: Skin is warm and dry.  Psychiatric: He has a normal mood and affect. His behavior is normal.    ------------------------------------------------------------------------------------------------------------------------------------------------------------------------------------------------------------------- Assessment and Plan  Transient alteration of awareness -? Partial seizures, referral placed to neurology.  -recommend 81mg  asa given other co-morbidities.

## 2018-08-13 DIAGNOSIS — F410 Panic disorder [episodic paroxysmal anxiety] without agoraphobia: Secondary | ICD-10-CM | POA: Diagnosis not present

## 2018-08-13 DIAGNOSIS — Z79899 Other long term (current) drug therapy: Secondary | ICD-10-CM | POA: Diagnosis not present

## 2018-08-20 ENCOUNTER — Encounter: Payer: Self-pay | Admitting: Neurology

## 2018-08-20 ENCOUNTER — Telehealth: Payer: Self-pay | Admitting: Neurology

## 2018-08-20 ENCOUNTER — Ambulatory Visit (INDEPENDENT_AMBULATORY_CARE_PROVIDER_SITE_OTHER): Payer: Medicare Other | Admitting: Neurology

## 2018-08-20 VITALS — BP 111/71 | HR 75 | Ht 70.0 in | Wt 203.5 lb

## 2018-08-20 DIAGNOSIS — M5432 Sciatica, left side: Secondary | ICD-10-CM | POA: Diagnosis not present

## 2018-08-20 DIAGNOSIS — R200 Anesthesia of skin: Secondary | ICD-10-CM | POA: Diagnosis not present

## 2018-08-20 DIAGNOSIS — R404 Transient alteration of awareness: Secondary | ICD-10-CM | POA: Diagnosis not present

## 2018-08-20 DIAGNOSIS — R519 Headache, unspecified: Secondary | ICD-10-CM

## 2018-08-20 DIAGNOSIS — E119 Type 2 diabetes mellitus without complications: Secondary | ICD-10-CM | POA: Diagnosis not present

## 2018-08-20 DIAGNOSIS — R51 Headache: Secondary | ICD-10-CM

## 2018-08-20 NOTE — Telephone Encounter (Signed)
Patient is aware and go give GI a call if he has not heard from them in the next couple days.

## 2018-08-20 NOTE — Progress Notes (Signed)
[facility]  PATIENT: Glenn Robertson DOB: 08-04-1966  REFERRING DOCTOR OR PCP: Luetta Nutting SOURCE: Patient, notes from PCP, laboratory tests  _________________________________   HISTORICAL  CHIEF COMPLAINT:  Chief Complaint  Patient presents with  . New Patient (Initial Visit)    RM 12, alone. Internal referral from Luetta Nutting, DO at [place] for altered awareness/possible seizures. He has episodes where he stares off but denies LOC. Sx started around 2012.   Marland Kitchen Headache    Having headaches on the right side of his head. Started in the last year or two. Occurs monthly.   . Numbness    Having foot numbness, bilaterally. Constant. Sx started around 2012 in left foot and then went into his right foot about 1-2 years ago.     HISTORY OF PRESENT ILLNESS:  I had the pleasure seeing a patient, [name], at [facility] for neurologic consultation regarding his episodes of transient alteration of awareness, headaches and numbness.  He has had several spells of altered awareness where he feels he loses time over the past couple years.   He denies loss of consciousness.  There is no generalized tonic-clonic activity and no tongue biting or incontinence.  He will have a lapse of seconds but will be non-interactive and won't hear what others are saying.    The first episode occurred in 2012.   He has had several others, the last one about 2 months ago.   He was sitting at the table and felt everything went black but others at the table were not aware of his changed.   His mom witnessed an event at a restaurant where he stared blankly for several seconds and had no memory of the event.   He does not recall having an EEG.     He also has a problem with headaches on the right.  The headaches occur weekly and last much of the day.   He may takes an NSAID but it does not always help.   He denies nausea or photophobia.      He also reports  numbness in his legs.   He has a h/o disc bulges in the past.   In 2012, he has the onset of numbness in the lateral left foot felt to be due to sciatica or lumbar changes.    This year, he has had numbness in both feet.   He denies foot weakness.   No changes in his gait.   He has tenderness in the right lateral hip.      He has paranoid schizophrenia that is doing well.    He has been on Latuda.     He snores but has no witnessed OSA.    He sometimes dozes off in the evening.  EPWORTH SLEEPINESS SCALE  On a scale of 0 - 3 what is the chance of dozing:  Sitting and Reading:   0 Watching TV:    2 Sitting inactive in a public place: 0 Passenger in car for one hour: 0 Lying down to rest in the afternoon: 3 Sitting and talking to someone: 0 Sitting quietly after lunch:  0 In a car, stopped in traffic:  0  Total (out of 24):   5/24   REVIEW OF SYSTEMS: Constitutional: No fevers, chills, sweats, or change in appetite Eyes: No visual changes, double vision, eye pain Ear, nose and throat: No hearing loss, ear pain, nasal congestion, sore throat Cardiovascular: No chest pain, palpitations  Respiratory: No shortness of breath at rest or with exertion.   No wheezes.  He snores. GastrointestinaI: No nausea, vomiting, diarrhea, abdominal pain, fecal incontinence Genitourinary: No dysuria, urinary retention or frequency.  No nocturia. Musculoskeletal: No neck pain, back pain Integumentary: No rash, pruritus, skin lesions Neurological: as above Psychiatric: He has a diagnosis of paranoid schizophrenia.   Endocrine: No palpitations, diaphoresis, change in appetite, change in weigh or increased thirst Hematologic/Lymphatic: No anemia, purpura, petechiae. Allergic/Immunologic: No itchy/runny eyes, nasal congestion, recent allergic reactions, rashes  ALLERGIES: Allergies  Allergen Reactions  . Citric Acid Other (See Comments)    Runny nose, eyes water  . Food     Oranges or anything with  citric acid    HOME MEDICATIONS:  Current Outpatient Medications:  .  ACCU-CHEK SOFTCLIX LANCETS lancets, 3 (three) times daily. for testing, Disp: , Rfl: 0 .  albuterol (PROVENTIL HFA;VENTOLIN HFA) 108 (90 Base) MCG/ACT inhaler, Inhale 2 puffs into the lungs as needed., Disp: 1 Inhaler, Rfl: 6 .  ASPIRIN 81 PO, Take 1 tablet by mouth daily., Disp: , Rfl:  .  buPROPion (WELLBUTRIN XL) 150 MG 24 hr tablet, Take 450 mg by mouth every morning., Disp: , Rfl: 1 .  clonazePAM (KLONOPIN) 1 MG tablet, Take 1 mg by mouth 2 (two) times daily as needed., Disp: , Rfl:  .  fluticasone (FLONASE) 50 MCG/ACT nasal spray, Place 2 sprays into both nostrils daily., Disp: 16 g, Rfl: 6 .  glipiZIDE (GLUCOTROL) 10 MG tablet, Take 1 tablet (10 mg total) by mouth 2 (two) times daily., Disp: 180 tablet, Rfl: 3 .  glucose blood (ACCU-CHEK AVIVA PLUS) test strip, Use to test blood sugar up to TID.  Testing more frequently due to symptoms of hypoglycemia.  Diagnosis: E11.9, Disp: 100 each, Rfl: 6 .  LATUDA 120 MG TABS, Take 1 tablet by mouth daily., Disp: , Rfl: 0 .  LUMIGAN 0.01 % SOLN, , Disp: , Rfl: 0 .  Meloxicam (MOBIC PO), Take 1 Dose by mouth as needed. FOR HIP PAIN, Disp: , Rfl:  .  montelukast (SINGULAIR) 10 MG tablet, Take 1 tablet (10 mg total) by mouth at bedtime., Disp: 90 tablet, Rfl: 3 .  simvastatin (ZOCOR) 20 MG tablet, Take 1 tablet (20 mg total) by mouth every evening., Disp: 90 tablet, Rfl: 3  PAST MEDICAL HISTORY: Past Medical History:  Diagnosis Date  . Allergy   . Anxiety   . Asthma   . Chronic pain   . Depression   . Diabetes mellitus   . Neuromuscular disorder ([place])     PAST SURGICAL HISTORY: Past Surgical History:  Procedure Laterality Date  . Arm Surgery  1/12   rt ulnar nerve decompression  . EPIDURAL BLOCK INJECTION     several  . ULNAR NERVE TRANSPOSITION  01/19/2012   Procedure: ULNAR NERVE DECOMPRESSION/TRANSPOSITION;  Surgeon: Cammie Sickle., MD;  Location: [place];  Service: Orthopedics;  Laterality: Left;  ulnar nerve decompression vs transposition left elbow    FAMILY HISTORY: Family History  Problem Relation Age of Onset  . Diabetes Father   . Diabetes Paternal Uncle     SOCIAL HISTORY:  Social History   Socioeconomic History  . Marital status: Single    Spouse name: Not on file  . Number of children: 0  . Years of education: BA  . Highest education level: Not on file  Occupational History  . Occupation: Unemlpoyed-disabled  Social Needs  . Emergency planning/management officer  strain: Not on file  . Food insecurity:    Worry: Not on file    Inability: Not on file  . Transportation needs:    Medical: Not on file    Non-medical: Not on file  Tobacco Use  . Smoking status: Never Smoker  . Smokeless tobacco: Never Used  Substance and Sexual Activity  . Alcohol use: No  . Drug use: No  . Sexual activity: Not on file  Lifestyle  . Physical activity:    Days per week: Not on file    Minutes per session: Not on file  . Stress: Not on file  Relationships  . Social connections:    Talks on phone: Not on file    Gets together: Not on file    Attends religious service: Not on file    Active member of club or organization: Not on file    Attends meetings of clubs or organizations: Not on file    Relationship status: Not on file  . Intimate partner violence:    Fear of current or ex partner: Not on file    Emotionally abused: Not on file    Physically abused: Not on file    Forced sexual activity: Not on file  Other Topics Concern  . Not on file  Social History Narrative   Lives with mother   Caffeine use: Coke very rare   Right handed      PHYSICAL EXAM  Vitals:   08/20/18 0815  BP: 111/71  Pulse: 75  Weight: 203 lb 8 oz (92.3 kg)  Height: 5\' 10"  (1.778 m)    Body mass index is 29.2 kg/m.   General: The patient is well-developed and well-nourished and in no acute distress  Eyes:  Funduscopic exam shows normal  optic discs and retinal vessels.  Neck: The neck is supple, no carotid bruits are noted.  The neck is nontender.  Cardiovascular: The heart has a regular rate and rhythm with a normal S1 and S2. There were no murmurs, gallops or rubs. Lungs are clear to auscultation.  Skin: Extremities are without significant edema.  Musculoskeletal:  Back is nontender  Neurologic Exam  Mental status: The patient is alert and oriented x 3 at the time of the examination. The patient has apparent normal recent and remote memory, with an apparently normal attention span and concentration ability.   Speech is normal.  Cranial nerves: Extraocular movements are full. Pupils are equal, round, and reactive to light and accomodation.  Visual fields are full.  Facial symmetry is present. There is good facial sensation to soft touch bilaterally.Facial strength is normal.  Trapezius and sternocleidomastoid strength is normal. No dysarthria is noted.  The tongue is midline, and the patient has symmetric elevation of the soft palate. No obvious hearing deficits are noted.  Motor:  Muscle bulk is normal.   Tone is normal. Strength is  5 / 5 in all 4 extremities.   Sensory: Sensory testing is intact to pinprick, soft touch and vibration sensation in the arms.  He reports mildly reduced touch and vibration sensation in the toes but not at the ankles.  Coordination: Cerebellar testing reveals good finger-nose-finger and heel-to-shin bilaterally.  Gait and station: Station is normal.   Gait is normal. Tandem gait is normal. Romberg is negative.   Reflexes: Deep tendon reflexes are symmetric and normal bilaterally.   Plantar responses are flexor.    DIAGNOSTIC DATA (LABS, IMAGING, TESTING) - I reviewed patient records, labs, notes, testing  and imaging myself where available.  Lab Results  Component Value Date   HGB 13.6 01/19/2012      Component Value Date/Time   NA 137 03/02/2018 1451   K 4.3 03/02/2018 1451   CL  102 03/02/2018 1451   CO2 28 03/02/2018 1451   GLUCOSE 97 03/02/2018 1451   BUN 14 03/02/2018 1451   CREATININE 1.10 03/02/2018 1451   CALCIUM 9.9 03/02/2018 1451   GFRNONAA >90 01/16/2012 1615   GFRAA >90 01/16/2012 1615   No results found for: CHOL, HDL, LDLCALC, LDLDIRECT, TRIG, CHOLHDL Lab Results  Component Value Date   HGBA1C 6.7 (A) 06/18/2018   Lab Results  Component Value Date   HQIONGEX52 841 03/02/2018   Lab Results  Component Value Date   TSH 0.92 03/15/2018       ASSESSMENT AND PLAN  Transient alteration of awareness  Nonintractable episodic headache, unspecified headache type  Numbness  Type 2 diabetes mellitus without complication, without long-term current use of insulin (HCC)  Sciatica of left side   1.   Etiology of the spells is unclear.  Seizure activity cannot be ruled out though this is more likely to represent a psychogenic fugue.  We will check an EEG to assess for seizure activity. 2.   MRI of the brain for the spells of transient alteration of awareness and headaches. 3.   Consider gabapentin for numbness/tingling if it worsens 4.   He will return in 2 to 3 months or sooner if there are new or worsening neurologic symptoms.    Dorthy Magnussen A. Felecia Shelling, MD, [facility] 32/44/0102, 7:25 AM Certified in Neurology, Clinical Neurophysiology, Sleep Medicine, Pain Medicine and Neuroimaging  [facility] [address] [place],  [address] [phone]

## 2018-08-20 NOTE — Telephone Encounter (Signed)
medicare/medicaid order sent to [facility]. No auth they will reach out to the pt to schedule.  °

## 2018-09-04 ENCOUNTER — Ambulatory Visit
Admission: RE | Admit: 2018-09-04 | Discharge: 2018-09-04 | Disposition: A | Discharge status: Home / Self Care | Payer: Medicare Other | Source: Ambulatory Visit | Attending: Neurology | Admitting: Neurology

## 2018-09-04 DIAGNOSIS — R519 Headache, unspecified: Secondary | ICD-10-CM

## 2018-09-04 DIAGNOSIS — R404 Transient alteration of awareness: Secondary | ICD-10-CM

## 2018-09-04 DIAGNOSIS — R51 Headache: Secondary | ICD-10-CM

## 2018-09-04 MED ORDER — GADOBENATE DIMEGLUMINE 529 MG/ML IV SOLN
20.0000 mL | Freq: Once | INTRAVENOUS | Status: AC | PRN
  Administered 2018-09-04: 20 mL via INTRAVENOUS

## 2018-09-06 ENCOUNTER — Telehealth: Payer: Self-pay | Admitting: *Deleted

## 2018-09-06 NOTE — Telephone Encounter (Signed)
-----   Message from Britt Bottom, MD sent at 09/06/2018  4:42 PM EST ----- Please let the patient know that the MRI of the brain was normal for age

## 2018-09-06 NOTE — Telephone Encounter (Signed)
Spoke to the patient to notify him of his MRI results.

## 2018-09-17 ENCOUNTER — Other Ambulatory Visit: Payer: Self-pay | Admitting: Family Medicine

## 2018-09-17 ENCOUNTER — Telehealth (INDEPENDENT_AMBULATORY_CARE_PROVIDER_SITE_OTHER): Payer: Self-pay | Admitting: Orthopaedic Surgery

## 2018-09-17 MED ORDER — ALBUTEROL SULFATE HFA 108 (90 BASE) MCG/ACT IN AERS: 2.0000 | INHALATION_SPRAY | RESPIRATORY_TRACT | 2 refills | Status: DC | PRN

## 2018-09-17 NOTE — Telephone Encounter (Signed)
Patient called left voicemail message asking if Dr. Erlinda Hong and the (PT) figured out what is going on with his right hip. Patient asked if it might be bursitis in the hip? The number to contact patient is [phone]

## 2018-09-17 NOTE — Telephone Encounter (Signed)
I don't know what he's referring to

## 2018-09-17 NOTE — Telephone Encounter (Signed)
Copied from [place] [id number]. Topic: Quick Communication - Rx Refill/Question >> Sep 17, 2018  8:56 AM Lionel December wrote: Medication: albuterol (PROVENTIL HFA;VENTOLIN HFA) 108 (90 Base) MCG/ACT inhaler  Has the patient contacted their pharmacy? Yes.   (Agent: If no, request that the patient contact the pharmacy for the refill.) (Agent: If yes, when and what did the pharmacy advise?)  Preferred Pharmacy (with phone number or street name): [facility] [phone] - [place], [address] AT [address] [phone] (Phone) [phone] (Fax)    Agent: Please be advised that RX refills may take up to 3 business days. We ask that you follow-up with your pharmacy.

## 2018-09-17 NOTE — Telephone Encounter (Signed)
See message.

## 2018-09-18 NOTE — Telephone Encounter (Signed)
See message below. Do you need to see patient again?

## 2018-09-18 NOTE — Telephone Encounter (Signed)
yes

## 2018-09-18 NOTE — Telephone Encounter (Signed)
Can we get more detail on what he is referring to regarding PT and Dr Erlinda Hong?

## 2018-09-18 NOTE — Telephone Encounter (Signed)
Spoke with patient he said he finished (PT) November 16th but, he is still doing the exercises. Patient said he know there is something  wrong with the right side of his hip but he does not know what it is. Patient said he want a diagnosis of what is wrong if possible. Patient asked if it could be bursitis in his hip? The number to contact patient is [phone]

## 2018-09-19 NOTE — Telephone Encounter (Signed)
Please make appt with Dr Erlinda Hong.

## 2018-09-19 NOTE — Telephone Encounter (Signed)
Patient is scheduled to see Dr. Erlinda Hong 09/21/2018 at 9:30am

## 2018-09-20 DIAGNOSIS — H401231 Low-tension glaucoma, bilateral, mild stage: Secondary | ICD-10-CM | POA: Diagnosis not present

## 2018-09-20 DIAGNOSIS — H43813 Vitreous degeneration, bilateral: Secondary | ICD-10-CM | POA: Diagnosis not present

## 2018-09-20 DIAGNOSIS — H2513 Age-related nuclear cataract, bilateral: Secondary | ICD-10-CM | POA: Diagnosis not present

## 2018-09-21 ENCOUNTER — Ambulatory Visit (INDEPENDENT_AMBULATORY_CARE_PROVIDER_SITE_OTHER): Payer: Medicare Other | Admitting: Orthopaedic Surgery

## 2018-09-21 DIAGNOSIS — M25551 Pain in right hip: Secondary | ICD-10-CM | POA: Diagnosis not present

## 2018-09-21 DIAGNOSIS — G8929 Other chronic pain: Secondary | ICD-10-CM | POA: Diagnosis not present

## 2018-09-21 DIAGNOSIS — M25511 Pain in right shoulder: Secondary | ICD-10-CM | POA: Diagnosis not present

## 2018-09-21 NOTE — Progress Notes (Signed)
Office Visit Note   Patient: Glenn Robertson           Date of Birth: 16-Jan-1966           MRN: [id number] Visit Date: 09/21/2018              Requested by: Luetta Nutting, DO [place], [place] [address] PCP: Luetta Nutting, DO   Assessment & Plan: Visit Diagnoses:  1. Pain of right hip joint   2. Chronic right shoulder pain     Plan: Overall impression is right hip abductor tendinosis and right shoulder pain.  I think is actually doing quite well and he is very active still.  I recommend relative rest when he is symptomatic.  We will send him to therapy for his right shoulder.  Follow-up as needed.  Follow-Up Instructions: Return if symptoms worsen or fail to improve.   Orders:  No orders of the defined types were placed in this encounter.  No orders of the defined types were placed in this encounter.     Procedures: No procedures performed   Clinical Data: No additional findings.   Subjective: Chief Complaint  Patient presents with  . Right Hip - Pain, Follow-up    Glenn Robertson comes in today with right hip pain and right shoulder pain.  Overall his symptoms are mild.  He is able to run without pain.  He is very active.  His memory is poor and so with trouble with HPI.   Review of Systems  Constitutional: Negative.   All other systems reviewed and are negative.    Objective: Vital Signs: There were no vitals taken for this visit.  Physical Exam Vitals signs and nursing note reviewed.  Constitutional:      Appearance: He is well-developed.  Pulmonary:     Effort: Pulmonary effort is normal.  Abdominal:     Palpations: Abdomen is soft.  Skin:    General: Skin is warm.  Neurological:     Mental Status: He is alert and oriented to person, place, and time.  Psychiatric:        Behavior: Behavior normal.        Thought Content: Thought content normal.        Judgment: Judgment normal.     Ortho Exam Right hip exam shows minimal  tenderness over the trochanteric bursa. Right shoulder exam is relatively unremarkable.  Mildly positive impingement sign. Specialty Comments:  No specialty comments available.  Imaging: No results found.   PMFS History: Patient Active Problem List   Diagnosis Date Noted  . Numbness 08/20/2018  . Transient alteration of awareness 08/08/2018  . Tendinopathy of right gluteus medius 05/22/2018  . Pain of right hip joint 05/08/2018  . Hearing difficulty 04/13/2018  . Trochanteric bursitis, right hip 03/23/2018  . Asthma 03/16/2018  . Headache 03/02/2018  . Type 2 diabetes mellitus without complication, without long-term current use of insulin ([place]) 02/16/2018  . B12 deficiency 02/16/2018  . Screening for colon cancer 02/16/2018  . Pain in left leg 10/28/2016  . Left wrist pain 07/12/2016  . Lumbosacral radiculopathy at S1 03/15/2016  . Lumbar degenerative disc disease 12/18/2015  . Subacromial impingement of left shoulder 10/26/2015  . Left lumbar radiculitis 04/21/2015  . Disc displacement, lumbar 04/21/2015  . Chondromalacia of both patellae 02/24/2015  . Paranoid schizophrenia, chronic condition ([place]) 02/24/2015  . Low back pain 07/18/2014  . Right knee pain 07/18/2014  . Right ankle pain 01/27/2014  . Pain  in joint, ankle and foot 01/27/2014  . Sciatica 01/10/2012  . Thoracic or lumbosacral neuritis or radiculitis, unspecified 12/15/2011  . Disturbance of skin sensation 11/14/2011   Past Medical History:  Diagnosis Date  . Allergy   . Anxiety   . Asthma   . Chronic pain   . Depression   . Diabetes mellitus   . Neuromuscular disorder ([place])     Family History  Problem Relation Age of Onset  . Diabetes Father   . Diabetes Paternal Uncle     Past Surgical History:  Procedure Laterality Date  . Arm Surgery  1/12   rt ulnar nerve decompression  . EPIDURAL BLOCK INJECTION     several  . ULNAR NERVE TRANSPOSITION  01/19/2012   Procedure: ULNAR NERVE  DECOMPRESSION/TRANSPOSITION;  Surgeon: Cammie Sickle., MD;  Location: [place];  Service: Orthopedics;  Laterality: Left;  ulnar nerve decompression vs transposition left elbow   Social History   Occupational History  . Occupation: Unemlpoyed-disabled  Tobacco Use  . Smoking status: Never Smoker  . Smokeless tobacco: Never Used  Substance and Sexual Activity  . Alcohol use: No  . Drug use: No  . Sexual activity: Not on file

## 2018-09-24 ENCOUNTER — Ambulatory Visit (INDEPENDENT_AMBULATORY_CARE_PROVIDER_SITE_OTHER): Payer: Medicare Other | Admitting: Family Medicine

## 2018-09-24 ENCOUNTER — Telehealth: Payer: Self-pay | Admitting: Family Medicine

## 2018-09-24 ENCOUNTER — Encounter: Payer: Self-pay | Admitting: Family Medicine

## 2018-09-24 VITALS — BP 136/74 | HR 81 | Temp 97.4°F | Ht 70.0 in | Wt 202.0 lb

## 2018-09-24 DIAGNOSIS — N529 Male erectile dysfunction, unspecified: Secondary | ICD-10-CM

## 2018-09-24 DIAGNOSIS — Z1211 Encounter for screening for malignant neoplasm of colon: Secondary | ICD-10-CM | POA: Diagnosis not present

## 2018-09-24 DIAGNOSIS — E119 Type 2 diabetes mellitus without complications: Secondary | ICD-10-CM

## 2018-09-24 LAB — LIPID PANEL
CHOLESTEROL: 118 mg/dL (ref 0–200)
HDL: 49.7 mg/dL (ref >39.00)
LDL Cholesterol: 55 mg/dL (ref 0–99)
NONHDL: 68.53
Total CHOL/HDL Ratio: 2
Triglycerides: 70 mg/dL (ref 0.0–149.0)
VLDL: 14 mg/dL (ref 0.0–40.0)

## 2018-09-24 LAB — COMPREHENSIVE METABOLIC PANEL
ALK PHOS: 62 U/L (ref 39–117)
ALT: 19 U/L (ref 0–53)
AST: 18 U/L (ref 0–37)
Albumin: 4.6 g/dL (ref 3.5–5.2)
BILIRUBIN TOTAL: 0.5 mg/dL (ref 0.2–1.2)
BUN: 12 mg/dL (ref 6–23)
CALCIUM: 9.9 mg/dL (ref 8.4–10.5)
CO2: 25 mEq/L (ref 19–32)
Chloride: 102 mEq/L (ref 96–112)
Creatinine, Ser: 1.16 mg/dL (ref 0.40–1.50)
GFR: 79.98 mL/min (ref >60.00)
Glucose, Bld: 143 mg/dL — ABNORMAL HIGH (ref 70–99)
Potassium: 4.1 mEq/L (ref 3.5–5.1)
SODIUM: 137 meq/L (ref 135–145)
TOTAL PROTEIN: 7.1 g/dL (ref 6.0–8.3)

## 2018-09-24 LAB — HEMOGLOBIN A1C: HEMOGLOBIN A1C: 7.9 % — AB (ref 4.6–6.5)

## 2018-09-24 MED ORDER — METFORMIN HCL ER 500 MG PO TB24: 1000.0000 mg | ORAL_TABLET | Freq: Every day | ORAL | 2 refills | Status: DC

## 2018-09-24 MED ORDER — TADALAFIL 20 MG PO TABS: 10.0000 mg | ORAL_TABLET | ORAL | 3 refills | Status: DC | PRN

## 2018-09-24 NOTE — Patient Instructions (Signed)
Type 2 Diabetes Mellitus, Self Care, Adult When you have type 2 diabetes (type 2 diabetes mellitus), you must make sure your blood sugar (glucose) stays in a healthy range. You can do this with:  Nutrition.  Exercise.  Lifestyle changes.  Medicines or insulin, if needed.  Support from your doctors and others. How to stay aware of blood sugar   Check your blood sugar level every day, as often as told.  Have your A1c (hemoglobin A1c) level checked two or more times a year. Have it checked more often if your doctor tells you to. Your doctor will set personal treatment goals for you. Generally, you should have these blood sugar levels:  Before meals (preprandial): 80-130 mg/dL (4.4-7.2 mmol/L).  After meals (postprandial): below 180 mg/dL (10 mmol/L).  A1c level: less than 7%. How to manage high and low blood sugar Signs of high blood sugar High blood sugar is called hyperglycemia. Know the signs of high blood sugar. Signs may include:  Feeling: ? Thirsty. ? Hungry. ? Very tired.  Needing to pee (urinate) more than usual.  Blurry vision. Signs of low blood sugar Low blood sugar is called hypoglycemia. This is when blood sugar is at or below 70 mg/dL (3.9 mmol/L). Signs may include:  Feeling: ? Hungry. ? Worried or nervous (anxious). ? Sweaty and clammy. ? Confused. ? Dizzy. ? Sleepy. ? Sick to your stomach (nauseous).  Having: ? A fast heartbeat. ? A headache. ? A change in your vision. ? Jerky movements that you cannot control (seizure). ? Tingling or no feeling (numbness) around your mouth, lips, or tongue.  Having trouble with: ? Moving (coordination). ? Sleeping. ? Passing out (fainting). ? Getting upset easily (irritability). Treating low blood sugar To treat low blood sugar, eat or drink something sugary right away. If you can think clearly and swallow safely, follow the 15:15 rule:  Take 15 grams of a fast-acting carb (carbohydrate). Talk with your  doctor about how much you should take.  Some fast-acting carbs are: ? Sugar tablets (glucose pills). Take 3-4 pills. ? 6-8 pieces of hard candy. ? 4-6 oz (120-150 mL) of fruit juice. ? 4-6 oz (120-150 mL) of regular (not diet) soda. ? 1 Tbsp (15 mL) honey or sugar.  Check your blood sugar 15 minutes after you take the carb.  If your blood sugar is still at or below 70 mg/dL (3.9 mmol/L), take 15 grams of a carb again.  If your blood sugar does not go above 70 mg/dL (3.9 mmol/L) after 3 tries, get help right away.  After your blood sugar goes back to normal, eat a meal or a snack within 1 hour. Treating very low blood sugar If your blood sugar is at or below 54 mg/dL (3 mmol/L), you have very low blood sugar (severe hypoglycemia). This is an emergency. Do not wait to see if the symptoms will go away. Get medical help right away. Call your local emergency services (911 in the [place] If you have very low blood sugar and you cannot eat or drink, you may need a glucagon shot (injection). A family member or friend should learn how to check your blood sugar and how to give you a glucagon shot. Ask your doctor if you need to have a glucagon shot kit at home. Follow these instructions at home: Medicine  Take insulin and diabetes medicines as told.  If your doctor says you should take more or less insulin and medicines, do this exactly as told.  Do not run out of insulin or medicines. Having diabetes can raise your risk for other long-term conditions. These include heart disease and kidney disease. Your doctor may prescribe medicines to help you not have these problems. Food   Make healthy food choices. These include: ? Chicken, fish, egg whites, and beans. ? Oats, whole wheat, bulgur, brown rice, quinoa, and millet. ? Fresh fruits and vegetables. ? Low-fat dairy products. ? Nuts, avocado, olive oil, and canola oil.  Meet with a food specialist (dietitian). He or she can help you make an  eating plan that is right for you.  Follow instructions from your doctor about what you cannot eat or drink.  Drink enough fluid to keep your pee (urine) pale yellow.  Keep track of carbs that you eat. Do this by reading food labels and learning food serving sizes.  Follow your sick day plan when you cannot eat or drink normally. Make this plan with your doctor so it is ready to use. Activity  Exercise 3 or more times a week.  Do not go more than 2 days without exercising.  Talk with your doctor before you start a new exercise. Your doctor may need to tell you to change: ? How much insulin or medicines you take. ? How much food you eat. Lifestyle  Do not use any tobacco products. These include cigarettes, chewing tobacco, and e-cigarettes. If you need help quitting, ask your doctor.  Ask your doctor how much alcohol is safe for you.  Learn to deal with stress. If you need help with this, ask your doctor. Body care   Stay up to date with your shots (immunizations).  Have your eyes and feet checked by a doctor as often as told.  Check your skin and feet every day. Check for cuts, bruises, redness, blisters, or sores.  Brush your teeth and gums two times a day. Floss one or more times a day.  Go to the dentist one or more times every 6 months.  Stay at a healthy weight. General instructions  Take over-the-counter and prescription medicines only as told by your doctor.  Share your diabetes care plan with: ? Your work or school. ? People you live with.  Carry a card or wear jewelry that says you have diabetes.  Keep all follow-up visits as told by your doctor. This is important. Questions to ask your doctor  Do I need to meet with a diabetes educator?  Where can I find a support group for people with diabetes? Where to find more information To learn more about diabetes, visit:  American Diabetes Association: www.diabetes.org  American Association of Diabetes  Educators: www.diabeteseducator.org Summary  When you have type 2 diabetes, you must make sure your blood sugar (glucose) stays in a healthy range.  Check your blood sugar every day, as often as told.  Having diabetes can raise your risk for other conditions. Your doctor may prescribe medicines to help you not have these problems.  Keep all follow-up visits as told by your doctor. This is important. This information is not intended to replace advice given to you by your health care provider. Make sure you discuss any questions you have with your health care provider. Document Released: 12/07/2015 Document Revised: 02/05/2018 Document Reviewed: 09/18/2015 Elsevier Interactive Patient Education  2019 Elsevier Inc.  

## 2018-09-24 NOTE — Progress Notes (Signed)
Glenn Robertson - 53 y.o. male MRN [id number]  Date of birth: May 12, 1966  Subjective Chief Complaint  Patient presents with  . Diabetes    doing well-he has been maintaing his BS.    HPI Glenn Robertson is a 53 y.o.  male with history of T2DM, and schizophrenia with depression here today for follow up of diabetes.   -T2DM:  He reports that he is doing well with a1c averaging 115-125 fasting.  He denies symptoms of hypoglycemia.  He is compliant with glipizide and metformin.  He is concerned about long term use of metformin as his B12 levels have been a little low in the past.  He does take a B12 supplement. He has been working on running more each day, almost up to 1/2 hour daily.    -ED:  Reports some issues with ED and would like to try cialis.  He has never tried anything for ED in the past.  He denies any issues with chest pain or exertional dyspnea.    ROS:  A comprehensive ROS was completed and negative except as noted per HPI   Allergies  Allergen Reactions  . Citric Acid Other (See Comments)    Runny nose, eyes water  . Food     Oranges or anything with citric acid    Past Medical History:  Diagnosis Date  . Allergy   . Anxiety   . Asthma   . Chronic pain   . Depression   . Diabetes mellitus   . Neuromuscular disorder [facility])     Past Surgical History:  Procedure Laterality Date  . Arm Surgery  1/12   rt ulnar nerve decompression  . EPIDURAL BLOCK INJECTION     several  . ULNAR NERVE TRANSPOSITION  01/19/2012   Procedure: ULNAR NERVE DECOMPRESSION/TRANSPOSITION;  Surgeon: Cammie Sickle., MD;  Location: [place];  Service: Orthopedics;  Laterality: Left;  ulnar nerve decompression vs transposition left elbow    Social History   Socioeconomic History  . Marital status: Single    Spouse name: Not on file  . Number of children: 0  . Years of education: BA  . Highest education level: Not on file  Occupational History  . Occupation:  Unemlpoyed-disabled  Social Needs  . Financial resource strain: Not on file  . Food insecurity:    Worry: Not on file    Inability: Not on file  . Transportation needs:    Medical: Not on file    Non-medical: Not on file  Tobacco Use  . Smoking status: Never Smoker  . Smokeless tobacco: Never Used  Substance and Sexual Activity  . Alcohol use: No  . Drug use: No  . Sexual activity: Not on file  Lifestyle  . Physical activity:    Days per week: Not on file    Minutes per session: Not on file  . Stress: Not on file  Relationships  . Social connections:    Talks on phone: Not on file    Gets together: Not on file    Attends religious service: Not on file    Active member of club or organization: Not on file    Attends meetings of clubs or organizations: Not on file    Relationship status: Not on file  Other Topics Concern  . Not on file  Social History Narrative   Lives with mother   Caffeine use: Coke very rare   Right handed     Family History  Problem Relation Age of Onset  . Diabetes Father   . Diabetes Paternal Uncle     Health Maintenance  Topic Date Due  . PNEUMOCOCCAL POLYSACCHARIDE VACCINE AGE 11-64 HIGH RISK  09/02/1968  . FOOT EXAM  09/02/1976  . HIV Screening  09/02/1981  . TETANUS/TDAP  09/02/1985  . COLONOSCOPY  09/02/2016  . HEMOGLOBIN A1C  12/18/2018  . URINE MICROALBUMIN  03/03/2019  . OPHTHALMOLOGY EXAM  05/03/2019  . INFLUENZA VACCINE  Completed    ----------------------------------------------------------------------------------------------------------------------------------------------------------------------------------------------------------------- Physical Exam BP 136/74   Pulse 81   Temp (!) 97.4 F (36.3 C) (Oral)   Ht 5\' 10"  (1.778 m)   Wt 202 lb (91.6 kg)   SpO2 97%   BMI 28.98 kg/m   Physical Exam Constitutional:      Appearance: Normal appearance.  HENT:     Head: Normocephalic and atraumatic.     Mouth/Throat:      Mouth: Mucous membranes are moist.  Eyes:     General: No scleral icterus. Neck:     Musculoskeletal: Neck supple.  Cardiovascular:     Rate and Rhythm: Normal rate and regular rhythm.  Pulmonary:     Effort: Pulmonary effort is normal.     Breath sounds: Normal breath sounds.  Skin:    General: Skin is warm and dry.     Findings: No rash.  Neurological:     General: No focal deficit present.     Mental Status: He is alert.  Psychiatric:        Mood and Affect: Mood normal.        Behavior: Behavior normal.     ------------------------------------------------------------------------------------------------------------------------------------------------------------------------------------------------------------------- Assessment and Plan  Type 2 diabetes mellitus without complication, without long-term current use of insulin ([place]) -Doing well with current medications, will plan to continue -Update A1c and lipid profile today -Encouraged to keep up exercise habits.  -F/u 3 months.   Screening for colon cancer -Still waiting to schedule for when he has someone to help transport him to appt for colonoscopy   Erectile dysfunction -Trial of tadalafil -Reviewed potential complications associated with this medication and reasons to seek emergency care including prolonged erection >4 hours.  -Discussed avoidance of nitrates and informing EMS/Emergency personnel if taking this medication and seen for chest pain.

## 2018-09-24 NOTE — Assessment & Plan Note (Signed)
-  Still waiting to schedule for when he has someone to help transport him to appt for colonoscopy

## 2018-09-24 NOTE — Telephone Encounter (Signed)
Copied from [place] [id number]. Topic: Quick Communication - See Telephone Encounter >> Sep 24, 2018 11:20 AM Rutherford Nail, NT wrote: CRM for notification. See Telephone encounter for: 09/24/18. Patient calling and states that the tadalafil (ADCIRCA/CIALIS) 20 MG tablet is going ot be too expensive for him to get. States that is was going to be $300. Would like to know if Dr Zigmund Daniel could send in a prescription for viagra (can be generic)? [facility] [place], [place]

## 2018-09-24 NOTE — Addendum Note (Signed)
Addended by: [name] A on: 09/24/2018 01:41 PM   Modules accepted: Orders

## 2018-09-24 NOTE — Telephone Encounter (Signed)
Pt called back.  He would like [facility] to [facility] [address] [place], [place]. [phone] (Phone) [phone] (Fax)

## 2018-09-24 NOTE — Telephone Encounter (Signed)
Spoke with patient-recommended [facility] or [facility] according to [facility] it is cheaper at these locations. Patient verbalized understanding.

## 2018-09-24 NOTE — Assessment & Plan Note (Signed)
-  Doing well with current medications, will plan to continue -Update A1c and lipid profile today -Encouraged to keep up exercise habits.  -F/u 3 months.

## 2018-09-24 NOTE — Assessment & Plan Note (Signed)
-  Trial of tadalafil -Reviewed potential complications associated with this medication and reasons to seek emergency care including prolonged erection >4 hours.  -Discussed avoidance of nitrates and informing EMS/Emergency personnel if taking this medication and seen for chest pain.

## 2018-09-26 ENCOUNTER — Ambulatory Visit (INDEPENDENT_AMBULATORY_CARE_PROVIDER_SITE_OTHER): Payer: Medicare Other | Admitting: Neurology

## 2018-09-26 DIAGNOSIS — R2990 Unspecified symptoms and signs involving the nervous system: Secondary | ICD-10-CM | POA: Diagnosis not present

## 2018-09-26 DIAGNOSIS — R404 Transient alteration of awareness: Secondary | ICD-10-CM

## 2018-09-27 NOTE — Progress Notes (Signed)
   [facility]  EEG (ELECTROENCEPHALOGRAM) REPORT   STUDY DATE: 09/26/2018 PATIENT NAME: Glenn Robertson DOB: Feb 24, 1966 MRN: [id number]  ORDERING CLINICIAN: Laketra Bowdish A. Felecia Shelling, MD. PhD  TECHNOLOGIST: Nonda Lou, RPSGT TECHNIQUE: Electroencephalogram was recorded utilizing standard 10-20 system of lead placement and reformatted into average and bipolar montages.  RECORDING TIME: 25 minutes and 40 seconds  CLINICAL INFORMATION: 53 year old man with episodes of transient alteration of awareness  FINDINGS: A digital EEG was performed while the patient was awake and drowsy. While awake and most alert there was a 10.5 hz posterior dominant rhythm.  While awake and alert, voltages and frequencies were symmetric.  Mild drowsy, there was mild asymmetry of frontal alpha activity amplitude, higher on the left.  There were no focal, lateralizing, epileptiform activity or seizures seen.  Photic stimulation had a normal driving response. Hyperventilation and recovery did not change the underlying rhythms. EKG channel shows NSR.  During the second half of the recording, he became drowsy but there is no definitive sleep.  IMPRESSION: This is a borderline normal EEG while the patient was awake and drowsy.  There was no definite epileptiform activity.  There was some asymmetry of the frontal amplitudes while drowsy but not while more alert.   INTERPRETING PHYSICIAN:   Zanylah Hardie A. Felecia Shelling, MD, PhD, [facility] Certified in Neurology, Clinical Neurophysiology, Sleep Medicine, Pain Medicine and Neuroimaging  [facility] [address], [place], [place] [address] [phone]

## 2018-09-28 NOTE — Progress Notes (Signed)
A1c is up compared to last check.  Recommend he start taking glipizide twice daily.  Continue metformin.

## 2018-10-02 ENCOUNTER — Telehealth: Payer: Self-pay | Admitting: *Deleted

## 2018-10-02 DIAGNOSIS — F410 Panic disorder [episodic paroxysmal anxiety] without agoraphobia: Secondary | ICD-10-CM | POA: Diagnosis not present

## 2018-10-02 DIAGNOSIS — Z79899 Other long term (current) drug therapy: Secondary | ICD-10-CM | POA: Diagnosis not present

## 2018-10-02 NOTE — Telephone Encounter (Signed)
Copied from [place] [id number]. Topic: General - Other >> Oct 02, 2018  4:05 PM Alanda Slim E wrote: Reason for CRM: Pt would like to know his A1C score. Pt wants to know if he should increase his medications or stay the same and states his fasting sugars are not getting any better./ please advise

## 2018-10-02 NOTE — Telephone Encounter (Signed)
Spoke with patient-he has been taking glipizide twice a day and metformin as directed. No change in his diet or exercise. His sugars have been 199 first thing in the morning for consecutive days. Please advise.

## 2018-10-03 ENCOUNTER — Telehealth: Payer: Self-pay | Admitting: *Deleted

## 2018-10-03 MED ORDER — EMPAGLIFLOZIN 10 MG PO TABS: 10.0000 mg | ORAL_TABLET | Freq: Every day | ORAL | 1 refills | Status: DC

## 2018-10-03 NOTE — Telephone Encounter (Signed)
I would recommend addition of Jardiance to his current medication.

## 2018-10-03 NOTE — Addendum Note (Signed)
Addended by: [name] A on: 10/03/2018 04:37 PM   Modules accepted: Orders

## 2018-10-03 NOTE — Telephone Encounter (Signed)
I called and spoke with pt about EEG results per Dr. Felecia Shelling: "EEG did not show any seizures". Advised him to call if he has new/worsening sx. Reminded him that he has f/u with NP on 11/19/18 at 8:30am. He verbalized understanding.

## 2018-10-03 NOTE — Telephone Encounter (Signed)
Spoke with patient, informed per Dr. Zigmund Daniel recommendation of adding Jardiance daily. Patient agrees-voiced understanding. RX sent to pharmacy.

## 2018-10-03 NOTE — Telephone Encounter (Signed)
Left VM to return call 

## 2018-10-08 ENCOUNTER — Ambulatory Visit (INDEPENDENT_AMBULATORY_CARE_PROVIDER_SITE_OTHER): Payer: Medicare Other | Admitting: Family Medicine

## 2018-10-08 ENCOUNTER — Encounter: Payer: Self-pay | Admitting: Family Medicine

## 2018-10-08 VITALS — BP 138/86 | HR 96 | Temp 97.7°F | Ht 70.0 in | Wt 202.0 lb

## 2018-10-08 DIAGNOSIS — R3911 Hesitancy of micturition: Secondary | ICD-10-CM | POA: Insufficient documentation

## 2018-10-08 LAB — POCT URINALYSIS DIPSTICK
Bilirubin, UA: NEGATIVE
Glucose, UA: POSITIVE — AB
Ketones, UA: NEGATIVE
Leukocytes, UA: NEGATIVE
NITRITE UA: NEGATIVE
PROTEIN UA: NEGATIVE
RBC UA: NEGATIVE
Spec Grav, UA: 1.01 (ref 1.010–1.025)
UROBILINOGEN UA: NEGATIVE U/dL — AB
pH, UA: 6.5 (ref 5.0–8.0)

## 2018-10-08 NOTE — Assessment & Plan Note (Signed)
-  Check UA and PSA -Discussed that uncontrolled diabetes may be contributing and recent addition of jardiance may may urinary frequency a little worse for a short period.

## 2018-10-08 NOTE — Progress Notes (Signed)
Glenn Robertson - 53 y.o. male MRN [id number]  Date of birth: Feb 17, 1966  Subjective Chief Complaint  Patient presents with  . Dysuria    has been chronic in nature-increasing more often    HPI Glenn Robertson is a 53 y.o. male here today with complaint of urinary frequency and hesitancy.  He reports that he has had increased urinary hesitancy that started a few months ago.  He also has some increased urinary frequency.  He denies pain with urinary, flank pain, fever, chills, hematuria, or increased nocturia.  He does report that his father has history of prostate cancer.   ROS:  A comprehensive ROS was completed and negative except as noted per HPI  Allergies  Allergen Reactions  . Citric Acid Other (See Comments)    Runny nose, eyes water  . Food     Oranges or anything with citric acid    Past Medical History:  Diagnosis Date  . Allergy   . Anxiety   . Asthma   . Chronic pain   . Depression   . Diabetes mellitus   . Neuromuscular disorder [facility])     Past Surgical History:  Procedure Laterality Date  . Arm Surgery  1/12   rt ulnar nerve decompression  . EPIDURAL BLOCK INJECTION     several  . ULNAR NERVE TRANSPOSITION  01/19/2012   Procedure: ULNAR NERVE DECOMPRESSION/TRANSPOSITION;  Surgeon: Cammie Sickle., MD;  Location: [place];  Service: Orthopedics;  Laterality: Left;  ulnar nerve decompression vs transposition left elbow    Social History   Socioeconomic History  . Marital status: Single    Spouse name: Not on file  . Number of children: 0  . Years of education: BA  . Highest education level: Not on file  Occupational History  . Occupation: Unemlpoyed-disabled  Social Needs  . Financial resource strain: Not on file  . Food insecurity:    Worry: Not on file    Inability: Not on file  . Transportation needs:    Medical: Not on file    Non-medical: Not on file  Tobacco Use  . Smoking status: Never Smoker  . Smokeless tobacco: Never  Used  Substance and Sexual Activity  . Alcohol use: No  . Drug use: No  . Sexual activity: Not on file  Lifestyle  . Physical activity:    Days per week: Not on file    Minutes per session: Not on file  . Stress: Not on file  Relationships  . Social connections:    Talks on phone: Not on file    Gets together: Not on file    Attends religious service: Not on file    Active member of club or organization: Not on file    Attends meetings of clubs or organizations: Not on file    Relationship status: Not on file  Other Topics Concern  . Not on file  Social History Narrative   Lives with mother   Caffeine use: Coke very rare   Right handed     Family History  Problem Relation Age of Onset  . Diabetes Father   . Diabetes Paternal Uncle     Health Maintenance  Topic Date Due  . PNEUMOCOCCAL POLYSACCHARIDE VACCINE AGE 59-64 HIGH RISK  09/02/1968  . FOOT EXAM  09/02/1976  . HIV Screening  09/02/1981  . TETANUS/TDAP  09/02/1985  . COLONOSCOPY  09/02/2016  . URINE MICROALBUMIN  03/03/2019  . HEMOGLOBIN A1C  03/25/2019  .  OPHTHALMOLOGY EXAM  05/03/2019  . INFLUENZA VACCINE  Completed    ----------------------------------------------------------------------------------------------------------------------------------------------------------------------------------------------------------------- Physical Exam BP 138/86   Pulse 96   Temp 97.7 F (36.5 C) (Oral)   Ht 5\' 10"  (1.778 m)   Wt 202 lb (91.6 kg)   SpO2 97%   BMI 28.98 kg/m   Physical Exam Constitutional:      Appearance: Normal appearance.  HENT:     Head: Normocephalic and atraumatic.  Eyes:     General: No scleral icterus. Neck:     Musculoskeletal: Neck supple.  Cardiovascular:     Rate and Rhythm: Normal rate and regular rhythm.  Pulmonary:     Effort: Pulmonary effort is normal.     Breath sounds: Normal breath sounds.  Neurological:     General: No focal deficit present.     Mental Status: He is  alert.  Psychiatric:        Mood and Affect: Mood normal.        Behavior: Behavior normal.     ------------------------------------------------------------------------------------------------------------------------------------------------------------------------------------------------------------------- Assessment and Plan  Urinary hesitancy -Check UA and PSA -Discussed that uncontrolled diabetes may be contributing and recent addition of jardiance may may urinary frequency a little worse for a short period.

## 2018-10-09 ENCOUNTER — Ambulatory Visit: Payer: Self-pay | Admitting: Family Medicine

## 2018-10-11 ENCOUNTER — Telehealth: Payer: Self-pay | Admitting: Family Medicine

## 2018-10-11 DIAGNOSIS — R3911 Hesitancy of micturition: Secondary | ICD-10-CM

## 2018-10-11 NOTE — Telephone Encounter (Signed)
Pt will stop by the office for blood work today.   Pt was wondering if he needs to do anything different with Jardiance 10 mg daily because he is reporting fasting blood sugar of 93 today and 104 yesterday morning. Please advise.

## 2018-10-11 NOTE — Telephone Encounter (Signed)
Pt is aware, also advise him that about 2-3 wks he should see some improvement with frequent urination from taking Jardiance.

## 2018-10-11 NOTE — Telephone Encounter (Signed)
Dr. Zigmund Daniel please see urine result. FYI--pt stated he didn't give blood sample that day because he didn't see anyone in the lab. Do you want him to come back for blood work PSA?

## 2018-10-11 NOTE — Telephone Encounter (Signed)
showing PSA stills needs to be colleted; spoke with Laurey Arrow at [facility]; she will follow up on this and contact the pt.

## 2018-10-11 NOTE — Telephone Encounter (Signed)
Continue jardiance at current dose.

## 2018-10-11 NOTE — Telephone Encounter (Signed)
Copied from [place] [id number]. Topic: Quick Communication - See Telephone Encounter >> Oct 11, 2018  8:37 AM Sheran Luz wrote: CRM for notification. See Telephone encounter for: 10/11/18.  Patient calling to check status of labs from 10/08/2018 and inquire if more testing is needed. Please advise.

## 2018-10-11 NOTE — Telephone Encounter (Signed)
Reviewed urine with him in clinic, glucose in urine from jardiance.  He needs to have PSA collected.

## 2018-10-17 ENCOUNTER — Other Ambulatory Visit (INDEPENDENT_AMBULATORY_CARE_PROVIDER_SITE_OTHER): Payer: Medicare Other

## 2018-10-17 DIAGNOSIS — R3911 Hesitancy of micturition: Secondary | ICD-10-CM | POA: Diagnosis not present

## 2018-10-17 LAB — PSA: PSA: 0.45 ng/mL (ref 0.10–4.00)

## 2018-10-18 ENCOUNTER — Ambulatory Visit (INDEPENDENT_AMBULATORY_CARE_PROVIDER_SITE_OTHER): Payer: Medicare Other | Admitting: Psychology

## 2018-10-18 DIAGNOSIS — F331 Major depressive disorder, recurrent, moderate: Secondary | ICD-10-CM | POA: Diagnosis not present

## 2018-10-18 DIAGNOSIS — F4310 Post-traumatic stress disorder, unspecified: Secondary | ICD-10-CM | POA: Diagnosis not present

## 2018-10-18 DIAGNOSIS — F411 Generalized anxiety disorder: Secondary | ICD-10-CM

## 2018-10-19 NOTE — Progress Notes (Signed)
PSA levels are normal.

## 2018-10-22 ENCOUNTER — Ambulatory Visit: Payer: Self-pay | Admitting: Family Medicine

## 2018-10-22 ENCOUNTER — Other Ambulatory Visit: Payer: Self-pay | Admitting: Family Medicine

## 2018-10-22 ENCOUNTER — Other Ambulatory Visit: Payer: Self-pay | Admitting: *Deleted

## 2018-10-22 DIAGNOSIS — F209 Schizophrenia, unspecified: Secondary | ICD-10-CM

## 2018-10-22 NOTE — Telephone Encounter (Signed)
Patient is requesting refill for his medications he was getting filled from his psychiatrist Dr. Chrissie Noa Headen-due to no longer prescribing controlled substances.    Per their office patient needs to fill out a record release to get records.   Left patient a VM to contact the office.

## 2018-10-22 NOTE — Telephone Encounter (Signed)
Patient called back. Patient will come into the office in the am and sign a records release form. Thank you

## 2018-10-23 NOTE — Telephone Encounter (Signed)
Patient requests refill on Clonazepam, he is almost out and will be going out of town soon. Please advise

## 2018-10-24 ENCOUNTER — Ambulatory Visit: Payer: Self-pay

## 2018-10-24 DIAGNOSIS — R3911 Hesitancy of micturition: Secondary | ICD-10-CM

## 2018-10-24 MED ORDER — CLONAZEPAM 1 MG PO TABS: 1.0000 mg | ORAL_TABLET | Freq: Two times a day (BID) | ORAL | 0 refills | Status: DC | PRN

## 2018-10-24 NOTE — Telephone Encounter (Addendum)
Patient started taking Jardiance 10/04/18-patient aware per Dr. Zigmund Daniel takes at least one month for his body to get used to the medication. Patient concerned about frequent urination and dribbling. Ok per Dr. Zigmund Daniel to place referral to Urology.

## 2018-10-24 NOTE — Telephone Encounter (Signed)
Will provide enough for 15 days until we get records.

## 2018-10-24 NOTE — Telephone Encounter (Signed)
Pt is prescribed Jardiance 10 mg daily. Pt stated that he is taking the med in the evening. He complained that he wakes up to urinate at midnight, but his fasting morning blood sugars are better (130 or less). Pt stated if he takes his Jardiance in the morning, his fasting morning blood sugars 135-149. Pt stated he goes to sleep at 6 pm and wakes at 4:00 am. Pt stated he stil wakes up at midnight to void.  Pt wants to know when to take his Jardiance. Pt advised to take the Jardiance in the morning. Advised the pt that NT will route a note to his doctor to review and advise.   Reason for Disposition . Caller has NON-URGENT medication question about med that PCP prescribed and triager unable to answer question  Answer Assessment - Initial Assessment Questions 1. SYMPTOMS: "Do you have any symptoms?"     no 2. SEVERITY: If symptoms are present, ask "Are they mild, moderate or severe?"     n/a  Protocols used: MEDICATION QUESTION CALL-A-AH

## 2018-10-25 NOTE — Addendum Note (Signed)
Addended by: [name] A on: 10/25/2018 03:21 PM   Modules accepted: Orders

## 2018-10-25 NOTE — Telephone Encounter (Signed)
Patient notified, verbalized understanding

## 2018-11-06 ENCOUNTER — Telehealth (INDEPENDENT_AMBULATORY_CARE_PROVIDER_SITE_OTHER): Payer: Self-pay | Admitting: Orthopaedic Surgery

## 2018-11-06 NOTE — Telephone Encounter (Signed)
Patient called stating that he needs a new referral to PT because [facility] does not take his insurance.  He does know that [facility] accepts his insurance.  CB#[phone].  Thank you

## 2018-11-06 NOTE — Telephone Encounter (Signed)
Message sent in error

## 2018-11-07 DIAGNOSIS — M25511 Pain in right shoulder: Secondary | ICD-10-CM | POA: Diagnosis not present

## 2018-11-07 NOTE — Telephone Encounter (Signed)
Where should we send  Patient instead?

## 2018-11-07 NOTE — Telephone Encounter (Signed)
Those are the same place.  [facility] will [facility] for [facility] and  bring to you

## 2018-11-07 NOTE — Telephone Encounter (Signed)
FAXED TO [facility]

## 2018-11-11 ENCOUNTER — Other Ambulatory Visit: Payer: Self-pay | Admitting: Family Medicine

## 2018-11-11 DIAGNOSIS — R404 Transient alteration of awareness: Secondary | ICD-10-CM

## 2018-11-11 DIAGNOSIS — F209 Schizophrenia, unspecified: Secondary | ICD-10-CM

## 2018-11-13 DIAGNOSIS — M25511 Pain in right shoulder: Secondary | ICD-10-CM | POA: Diagnosis not present

## 2018-11-14 NOTE — Progress Notes (Deleted)
PATIENT: Glenn Robertson DOB: 16-Nov-1965  REASON FOR VISIT: follow up HISTORY FROM: patient  No chief complaint on file.    HISTORY OF PRESENT ILLNESS: Today 11/14/18 Glenn Robertson is a 52 y.o. male here today for follow up for transient spells of altered awareness, headaches and numbness in lower extremities. MRI was normal and EEG did not show any seizure activity.     HISTORY: (copied from Dr Garth Bigness note on 08/20/2018) I had the pleasure seeing a patient, Glenn Robertson, at [facility] for neurologic consultation regarding his episodes of transient alteration of awareness, headaches and numbness.  He has had several spells of altered awareness where he feels he loses time over the past couple years.   He denies loss of consciousness.  There is no generalized tonic-clonic activity and no tongue biting or incontinence.  He will have a lapse of seconds but will be non-interactive and won't hear what others are saying.    The first episode occurred in 2012.   He has had several others, the last one about 2 months ago.   He was sitting at the table and felt everything went black but others at the table were not aware of his changed.   His mom witnessed an event at a restaurant where he stared blankly for several seconds and had no memory of the event.   He does not recall having an EEG.     He also has a problem with headaches on the right.  The headaches occur weekly and last much of the day.   He may takes an NSAID but it does not always help.   He denies nausea or photophobia.      He also reports numbness in his legs.   He has a h/o disc bulges in the past.   In 2012, he has the onset of numbness in the lateral left foot felt to be due to sciatica or lumbar changes.    This year, he has had numbness in both feet.   He denies foot weakness.   No changes in his gait.   He has tenderness in the right lateral hip.      He has paranoid schizophrenia that is doing well.     He has been on Latuda.     He snores but has no witnessed OSA.    He sometimes dozes off in the evening.  REVIEW OF SYSTEMS: Out of a complete 14 system review of symptoms, the patient complains only of the following symptoms, and all other reviewed systems are negative.  ALLERGIES: Allergies  Allergen Reactions  . Citric Acid Other (See Comments)    Runny nose, eyes water  . Food     Oranges or anything with citric acid    HOME MEDICATIONS: Outpatient Medications Prior to Visit  Medication Sig Dispense Refill  . ACCU-CHEK SOFTCLIX LANCETS lancets 3 (three) times daily. for testing  0  . albuterol (PROVENTIL HFA;VENTOLIN HFA) 108 (90 Base) MCG/ACT inhaler Inhale 2 puffs into the lungs as needed. 1 Inhaler 2  . ASPIRIN 81 PO Take 1 tablet by mouth daily.    Marland Kitchen buPROPion (WELLBUTRIN XL) 150 MG 24 hr tablet Take 450 mg by mouth every morning.  1  . clonazePAM (KLONOPIN) 1 MG tablet Take 1 tablet (1 mg total) by mouth 2 (two) times daily as needed. 30 tablet 0  . empagliflozin (JARDIANCE) 10 MG TABS tablet Take 10 mg by mouth daily. 90 tablet 1  .  fluticasone (FLONASE) 50 MCG/ACT nasal spray Place 2 sprays into both nostrils daily. 16 g 6  . glipiZIDE (GLUCOTROL) 10 MG tablet Take 1 tablet (10 mg total) by mouth 2 (two) times daily. 180 tablet 3  . glucose blood (ACCU-CHEK AVIVA PLUS) test strip Use to test blood sugar up to TID.  Testing more frequently due to symptoms of hypoglycemia.  Diagnosis: E11.9 100 each 6  . LUMIGAN 0.01 % SOLN   0  . Meloxicam (MOBIC PO) Take 1 Dose by mouth as needed. FOR HIP PAIN    . metFORMIN (GLUCOPHAGE-XR) 500 MG 24 hr tablet Take 2 tablets (1,000 mg total) by mouth daily with breakfast. 180 tablet 2  . montelukast (SINGULAIR) 10 MG tablet Take 1 tablet (10 mg total) by mouth at bedtime. 90 tablet 3  . [name] 3 MG TABS TK 1 T PO  QD    . simvastatin (ZOCOR) 20 MG tablet Take 1 tablet (20 mg total) by mouth every evening. 90 tablet 3  . tadalafil  (ADCIRCA/CIALIS) 20 MG tablet Take 0.5-1 tablets (10-20 mg total) by mouth every other day as needed for erectile dysfunction. 5 tablet 3   No facility-administered medications prior to visit.     PAST MEDICAL HISTORY: Past Medical History:  Diagnosis Date  . Allergy   . Anxiety   . Asthma   . Chronic pain   . Depression   . Diabetes mellitus   . Neuromuscular disorder ([place])     PAST SURGICAL HISTORY: Past Surgical History:  Procedure Laterality Date  . Arm Surgery  1/12   rt ulnar nerve decompression  . EPIDURAL BLOCK INJECTION     several  . ULNAR NERVE TRANSPOSITION  01/19/2012   Procedure: ULNAR NERVE DECOMPRESSION/TRANSPOSITION;  Surgeon: Cammie Sickle., MD;  Location: [facility];  Service: Orthopedics;  Laterality: Left;  ulnar nerve decompression vs transposition left elbow    FAMILY HISTORY: Family History  Problem Relation Age of Onset  . Diabetes Father   . Diabetes Paternal Uncle     SOCIAL HISTORY: Social History   Socioeconomic History  . Marital status: Single    Spouse name: Not on file  . Number of children: 0  . Years of education: BA  . Highest education level: Not on file  Occupational History  . Occupation: Unemlpoyed-disabled  Social Needs  . Financial resource strain: Not on file  . Food insecurity:    Worry: Not on file    Inability: Not on file  . Transportation needs:    Medical: Not on file    Non-medical: Not on file  Tobacco Use  . Smoking status: Never Smoker  . Smokeless tobacco: Never Used  Substance and Sexual Activity  . Alcohol use: No  . Drug use: No  . Sexual activity: Not on file  Lifestyle  . Physical activity:    Days per week: Not on file    Minutes per session: Not on file  . Stress: Not on file  Relationships  . Social connections:    Talks on phone: Not on file    Gets together: Not on file    Attends religious service: Not on file    Active member of club or organization: Not on file     Attends meetings of clubs or organizations: Not on file    Relationship status: Not on file  . Intimate partner violence:    Fear of current or ex partner: Not on file  Emotionally abused: Not on file    Physically abused: Not on file    Forced sexual activity: Not on file  Other Topics Concern  . Not on file  Social History Narrative   Lives with mother   Caffeine use: Coke very rare   Right handed       PHYSICAL EXAM  There were no vitals filed for this visit. There is no height or weight on file to calculate BMI.  Generalized: Well developed, in no acute distress  Cardiology: normal rate and rhythm, no murmur noted Neurological examination  Mentation: Alert oriented to time, place, history taking. Follows all commands speech and language fluent Cranial nerve II-XII: Pupils were equal round reactive to light. Extraocular movements were full, visual field were full on confrontational test. Facial sensation and strength were normal. Uvula tongue midline. Head turning and shoulder shrug  were normal and symmetric. Motor: The motor testing reveals 5 over 5 strength of all 4 extremities. Good symmetric motor tone is noted throughout.  Sensory: Sensory testing is intact to soft touch on all 4 extremities. No evidence of extinction is noted.  Coordination: Cerebellar testing reveals good finger-nose-finger and heel-to-shin bilaterally.  Gait and station: Gait is normal. Tandem gait is normal. Romberg is negative. No drift is seen.  Reflexes: Deep tendon reflexes are symmetric and normal bilaterally.   DIAGNOSTIC DATA (LABS, IMAGING, TESTING) - I reviewed patient records, labs, notes, testing and imaging myself where available.  No flowsheet data found.   Lab Results  Component Value Date   HGB 13.6 01/19/2012      Component Value Date/Time   NA 137 09/24/2018 0921   K 4.1 09/24/2018 0921   CL 102 09/24/2018 0921   CO2 25 09/24/2018 0921   GLUCOSE 143 (H) 09/24/2018  0921   BUN 12 09/24/2018 0921   CREATININE 1.16 09/24/2018 0921   CREATININE 1.10 03/02/2018 1451   CALCIUM 9.9 09/24/2018 0921   PROT 7.1 09/24/2018 0921   ALBUMIN 4.6 09/24/2018 0921   AST 18 09/24/2018 0921   ALT 19 09/24/2018 0921   ALKPHOS 62 09/24/2018 0921   BILITOT 0.5 09/24/2018 0921   GFRNONAA >90 01/16/2012 1615   GFRAA >90 01/16/2012 1615   Lab Results  Component Value Date   CHOL 118 09/24/2018   HDL 49.70 09/24/2018   LDLCALC 55 09/24/2018   TRIG 70.0 09/24/2018   CHOLHDL 2 09/24/2018   Lab Results  Component Value Date   HGBA1C 7.9 (H) 09/24/2018   Lab Results  Component Value Date   VITAMINB12 866 03/02/2018   Lab Results  Component Value Date   TSH 0.92 03/15/2018       ASSESSMENT AND PLAN 53 y.o. year old male  has a past medical history of Allergy, Anxiety, Asthma, Chronic pain, Depression, Diabetes mellitus, and Neuromuscular disorder ([place]). here with ***  No diagnosis found.     No orders of the defined types were placed in this encounter.    No orders of the defined types were placed in this encounter.     I spent 15 minutes with the patient. 50% of this time was spent counseling and educating patient on plan of care and medications.    Debbora Presto, FNP-C 11/14/2018, 8:14 AM [facility] [address] [place], [place] [address] [phone]

## 2018-11-15 NOTE — Telephone Encounter (Signed)
Copied from [place] [id number]. Topic: General - Other >> Nov 14, 2018 12:40 PM Oneta Rack wrote: Patient checking on the status of psychiatric referral, patient states he's running out of medication, please advise >> Nov 14, 2018  2:30 PM Carolyn Stare wrote:   Pt said his appt with the psyst is not till 11/26/2018 and is asking if a 2 week supply can be called in    clonazePAM (KLONOPIN) 1 MG tablet  [place]

## 2018-11-16 ENCOUNTER — Telehealth: Payer: Self-pay

## 2018-11-16 DIAGNOSIS — M25511 Pain in right shoulder: Secondary | ICD-10-CM | POA: Diagnosis not present

## 2018-11-16 NOTE — Telephone Encounter (Signed)
Spoke with the patient to let him know that his appointment with on Amy on 3-23 has been cancelled and we would r/s at a later time. Patient verbalized understanding.

## 2018-11-19 ENCOUNTER — Ambulatory Visit: Payer: Medicare Other | Admitting: Family Medicine

## 2018-11-19 ENCOUNTER — Other Ambulatory Visit: Payer: Self-pay

## 2018-11-19 ENCOUNTER — Telehealth: Payer: Self-pay

## 2018-11-19 ENCOUNTER — Telehealth (INDEPENDENT_AMBULATORY_CARE_PROVIDER_SITE_OTHER): Payer: Medicare Other | Admitting: Family Medicine

## 2018-11-19 DIAGNOSIS — M25511 Pain in right shoulder: Secondary | ICD-10-CM | POA: Diagnosis not present

## 2018-11-19 DIAGNOSIS — R200 Anesthesia of skin: Secondary | ICD-10-CM | POA: Diagnosis not present

## 2018-11-19 DIAGNOSIS — R404 Transient alteration of awareness: Secondary | ICD-10-CM | POA: Diagnosis not present

## 2018-11-19 MED ORDER — GABAPENTIN 100 MG PO CAPS: 100.0000 mg | ORAL_CAPSULE | Freq: Three times a day (TID) | ORAL | 11 refills | Status: DC

## 2018-11-19 NOTE — Telephone Encounter (Signed)
Perfect. Thank you!

## 2018-11-19 NOTE — Progress Notes (Signed)
Virtual Visit via Telephone Note  I connected with Glenn Robertson on 11/19/18 at  8:30 AM EDT by telephone and verified that I am speaking with the correct person using two identifiers.   I discussed the limitations, risks, security and privacy concerns of performing an evaluation and management service by telephone and the availability of in person appointments. I also discussed with the patient that there may be a patient responsible charge related to this service. The patient expressed understanding and agreed to proceed.   History of Present Illness:  Glenn Robertson is a 53 y.o. male here today for follow up for transient alteration of awareness and numbness in bilateral feet. He is doing well and without concerns. He denies episodes of altered awareness since last visit.  He does continue to complain of neuropathic pain.  Left leg and foot is worse but he has numbness in bilateral feet.  He was given a prescription of gabapentin at his last visit but reports that he never picked it up.  He does not feel that this pain is limiting him, however, it is very aggravating.  HISTORY: (copied from Dr Garth Bigness note on 08/20/2018) I had the pleasure seeing a patient, Glenn Robertson, at [facility] for neurologic consultation regarding his episodes of transient alteration of awareness, headaches and numbness.  He has hadseveralspells of altered awarenesswhere he feels heloses time over the past couple years. He denies loss of consciousness. There is no generalized tonic-clonic activity and no tongue biting or incontinence. He will have a lapse of seconds but will be non-interactive and won't hear what others are saying. The first episode occurred in 2012. He has had several others, the last one about 2 months ago. He was sitting at the table and felt everything went black but others at the table were not aware of his changed. His mom witnessed an event at a restaurant where  he stared blankly for several seconds and had no memory of the event. He does not recall having an EEG.   He also has a problem with headaches on the right. The headaches occur weekly and last much of the day. He may takes an NSAID but it does not always help. He denies nausea or photophobia.   He also reports numbness in his legs. He has a h/o disc bulges in the past. In 2012, he has the onset of numbness in the lateral left foot felt to be due to sciatica or lumbar changes. This year, he has had numbness in both feet. He denies foot weakness. No changes in his gait. He has tenderness in the right lateral hip.   He has paranoid schizophrenia that is doing well. He has been on Latuda.   He snores but has no witnessed OSA. He sometimes dozes off in the evening.   Observations/Objective:  Patient is alert.  He answers questions appropriately.   Assessment and Plan:  53 y.o. year old male  has a past medical history of Allergy, Anxiety, Asthma, Chronic pain, Depression, Diabetes mellitus, and Neuromuscular disorder ([place]). here with     ICD-10-CM   1. Transient alteration of awareness R40.4   2. Numbness R20.0     He is doing well. No recent episodes of altered awareness. We will start gabapentin as prescribed for numbness. He was advised to follow up closely with PCP for management of DMT2.  Healthy lifestyle with regular exercise also advised.  He will follow up with [place] in 6 months, sooner if needed.  Follow Up Instructions:    I discussed the assessment and treatment plan with the patient. The patient was provided an opportunity to ask questions and all were answered. The patient agreed with the plan and demonstrated an understanding of the instructions.   The patient was advised to call back or seek an in-person evaluation if the symptoms worsen or if the condition fails to improve as anticipated.  I provided 25 minutes of  non-face-to-face time during this encounter.   Debbora Presto, NP

## 2018-11-19 NOTE — Patient Instructions (Signed)
Neuropathic Pain Neuropathic pain is pain caused by damage to the nerves that are responsible for certain sensations in your body (sensory nerves). The pain can be caused by:  Damage to the sensory nerves that send signals to your spinal cord and brain (peripheral nervous system).  Damage to the sensory nerves in your brain or spinal cord (central nervous system). Neuropathic pain can make you more sensitive to pain. Even a minor sensation can feel very painful. This is usually a long-term condition that can be difficult to treat. The type of pain differs from person to person. It may:  Start suddenly (acute), or it may develop slowly and last for a long time (chronic).  Come and go as damaged nerves heal, or it may stay at the same level for years.  Cause emotional distress, loss of sleep, and a lower quality of life. What are the causes? The most common cause of this condition is diabetes. Many other diseases and conditions can also cause neuropathic pain. Causes of neuropathic pain can be classified as:  Toxic. This is caused by medicines and chemicals. The most common cause of toxic neuropathic pain is damage from cancer treatments (chemotherapy).  Metabolic. This can be caused by: ? Diabetes. This is the most common disease that damages the nerves. ? Lack of vitamin B from long-term alcohol abuse.  Traumatic. Any injury that cuts, crushes, or stretches a nerve can cause damage and pain. A common example is feeling pain after losing an arm or leg (phantom limb pain).  Compression-related. If a sensory nerve gets trapped or compressed for a long period of time, the blood supply to the nerve can be cut off.  Vascular. Many blood vessel diseases can cause neuropathic pain by decreasing blood supply and oxygen to nerves.  Autoimmune. This type of pain results from diseases in which the body's defense system (immune system) mistakenly attacks sensory nerves. Examples of autoimmune diseases  that can cause neuropathic pain include lupus and multiple sclerosis.  Infectious. Many types of viral infections can damage sensory nerves and cause pain. Shingles infection is a common cause of this type of pain.  Inherited. Neuropathic pain can be a symptom of many diseases that are passed down through families (genetic). What increases the risk? You are more likely to develop this condition if:  You have diabetes.  You smoke.  You drink too much alcohol.  You are taking certain medicines, including medicines that kill cancer cells (chemotherapy) or that treat immune system disorders. What are the signs or symptoms? The main symptom is pain. Neuropathic pain is often described as:  Burning.  Shock-like.  Stinging.  Hot or cold.  Itching. How is this diagnosed? No single test can diagnose neuropathic pain. It is diagnosed based on:  Physical exam and your symptoms. Your health care provider will ask you about your pain. You may be asked to use a pain scale to describe how bad your pain is.  Tests. These may be done to see if you have a high sensitivity to pain and to help find the cause and location of any sensory nerve damage. They include: ? Nerve conduction studies to test how well nerve signals travel through your sensory nerves (electrodiagnostic testing). ? Stimulating your sensory nerves through electrodes on your skin and measuring the response in your spinal cord and brain (somatosensory evoked potential).  Imaging studies, such as: ? X-rays. ? CT scan. ? MRI. How is this treated? Treatment for neuropathic pain may change   over time. You may need to try different treatment options or a combination of treatments. Some options include:  Treating the underlying cause of the neuropathy, such as diabetes, kidney disease, or vitamin deficiencies.  Stopping medicines that can cause neuropathy, such as chemotherapy.  Medicine to relieve pain. Medicines may include: ?  Prescription or over-the-counter pain medicine. ? Anti-seizure medicine. ? Antidepressant medicines. ? Pain-relieving patches that are applied to painful areas of skin. ? A medicine to numb the area (local anesthetic), which can be injected as a nerve block.  Transcutaneous nerve stimulation. This uses electrical currents to block painful nerve signals. The treatment is painless.  Alternative treatments, such as: ? Acupuncture. ? Meditation. ? Massage. ? Physical therapy. ? Pain management programs. ? Counseling. Follow these instructions at home: Medicines   Take over-the-counter and prescription medicines only as told by your health care provider.  Do not drive or use heavy machinery while taking prescription pain medicine.  If you are taking prescription pain medicine, take actions to prevent or treat constipation. Your health care provider may recommend that you: ? Drink enough fluid to keep your urine pale yellow. ? Eat foods that are high in fiber, such as fresh fruits and vegetables, whole grains, and beans. ? Limit foods that are high in fat and processed sugars, such as fried or sweet foods. ? Take an over-the-counter or prescription medicine for constipation. Lifestyle   Have a good support system at home.  Consider joining a chronic pain support group.  Do not use any products that contain nicotine or tobacco, such as cigarettes and e-cigarettes. If you need help quitting, ask your health care provider.  Do not drink alcohol. General instructions  Learn as much as you can about your condition.  Work closely with all your health care providers to find the treatment plan that works best for you.  Ask your health care provider what activities are safe for you.  Keep all follow-up visits as told by your health care provider. This is important. Contact a health care provider if:  Your pain treatments are not working.  You are having side effects from your  medicines.  You are struggling with tiredness (fatigue), mood changes, depression, or anxiety. Summary  Neuropathic pain is pain caused by damage to the nerves that are responsible for certain sensations in your body (sensory nerves).  Neuropathic pain may come and go as damaged nerves heal, or it may stay at the same level for years.  Neuropathic pain is usually a long-term condition that can be difficult to treat. Consider joining a chronic pain support group. This information is not intended to replace advice given to you by your health care provider. Make sure you discuss any questions you have with your health care provider. Document Released: 05/12/2004 Document Revised: 09/01/2017 Document Reviewed: 09/01/2017 Elsevier Interactive Patient Education  2019 Elsevier [employer]. Gabapentin capsules or tablets What is this medicine? GABAPENTIN (GA ba pen tin) is used to control seizures in certain types of epilepsy. It is also used to treat certain types of nerve pain. This medicine may be used for other purposes; ask your health care provider or pharmacist if you have questions. COMMON BRAND NAME(S): Active-PAC with Gabapentin, Gabarone, Neurontin What should I tell my health care provider before I take this medicine? They need to know if you have any of these conditions: -kidney disease -suicidal thoughts, plans, or attempt; a previous suicide attempt by you or a family member -an unusual or allergic   reaction to gabapentin, other medicines, foods, dyes, or preservatives -pregnant or trying to get pregnant -breast-feeding How should I use this medicine? Take this medicine by mouth with a glass of water. Follow the directions on the prescription label. You can take it with or without food. If it upsets your stomach, take it with food. Take your medicine at regular intervals. Do not take it more often than directed. Do not stop taking except on your doctor's advice. If you are directed to break  the 600 or 800 mg tablets in half as part of your dose, the extra half tablet should be used for the next dose. If you have not used the extra half tablet within 28 days, it should be thrown away. A special MedGuide will be given to you by the pharmacist with each prescription and refill. Be sure to read this information carefully each time. Talk to your pediatrician regarding the use of this medicine in children. While this drug may be prescribed for children as young as 3 years for selected conditions, precautions do apply. Overdosage: If you think you have taken too much of this medicine contact a poison control center or emergency room at once. NOTE: This medicine is only for you. Do not share this medicine with others. What if I miss a dose? If you miss a dose, take it as soon as you can. If it is almost time for your next dose, take only that dose. Do not take double or extra doses. What may interact with this medicine? Do not take this medicine with any of the following medications: -other gabapentin products This medicine may also interact with the following medications: -alcohol -antacids -antihistamines for allergy, cough and cold -certain medicines for anxiety or sleep -certain medicines for depression or psychotic disturbances -homatropine; hydrocodone -naproxen -narcotic medicines (opiates) for pain -phenothiazines like chlorpromazine, mesoridazine, prochlorperazine, thioridazine This list may not describe all possible interactions. Give your health care provider a list of all the medicines, herbs, non-prescription drugs, or dietary supplements you use. Also tell them if you smoke, drink alcohol, or use illegal drugs. Some items may interact with your medicine. What should I watch for while using this medicine? Visit your doctor or health care professional for regular checks on your progress. You may want to keep a record at home of how you feel your condition is responding to  treatment. You may want to share this information with your doctor or health care professional at each visit. You should contact your doctor or health care professional if your seizures get worse or if you have any new types of seizures. Do not stop taking this medicine or any of your seizure medicines unless instructed by your doctor or health care professional. Stopping your medicine suddenly can increase your seizures or their severity. Wear a medical identification bracelet or chain if you are taking this medicine for seizures, and carry a card that lists all your medications. You may get drowsy, dizzy, or have blurred vision. Do not drive, use machinery, or do anything that needs mental alertness until you know how this medicine affects you. To reduce dizzy or fainting spells, do not sit or stand up quickly, especially if you are an older patient. Alcohol can increase drowsiness and dizziness. Avoid alcoholic drinks. Your mouth may get dry. Chewing sugarless gum or sucking hard candy, and drinking plenty of water will help. The use of this medicine may increase the chance of suicidal thoughts or actions. Pay special attention to   how you are responding while on this medicine. Any worsening of mood, or thoughts of suicide or dying should be reported to your health care professional right away. Women who become pregnant while using this medicine may enroll in the [place] Antiepileptic Drug Pregnancy Registry by calling 1-888-233-2334. This registry collects information about the safety of antiepileptic drug use during pregnancy. What side effects may I notice from receiving this medicine? Side effects that you should report to your doctor or health care professional as soon as possible: -allergic reactions like skin rash, itching or hives, swelling of the face, lips, or tongue -worsening of mood, thoughts or actions of suicide or dying Side effects that usually do not require medical attention  (report to your doctor or health care professional if they continue or are bothersome): -constipation -difficulty walking or controlling muscle movements -dizziness -nausea -slurred speech -tiredness -tremors -weight gain This list may not describe all possible side effects. Call your doctor for medical advice about side effects. You may report side effects to FDA at 1-800-FDA-1088. Where should I keep my medicine? Keep out of reach of children. This medicine may cause accidental overdose and death if it taken by other adults, children, or pets. Mix any unused medicine with a substance like cat litter or coffee grounds. Then throw the medicine away in a sealed container like a sealed bag or a coffee can with a lid. Do not use the medicine after the expiration date. Store at room temperature between 15 and 30 degrees C (59 and 86 degrees F). NOTE: This sheet is a summary. It may not cover all possible information. If you have questions about this medicine, talk to your doctor, pharmacist, or health care provider.  2019 Elsevier/Gold Standard (2018-01-18 13:21:44)  

## 2018-11-19 NOTE — Progress Notes (Deleted)
PATIENT: Glenn Robertson DOB: [birth year]  REASON FOR VISIT: follow up HISTORY FROM: patient  No chief complaint on file.    HISTORY OF PRESENT ILLNESS: Today 11/19/18 Glenn Robertson is a 53 y.o. male here today for follow up for transient alteration of awareness and numbness in bilateral feet. He is doing well and without concerns. He denies episodes of altered awareness since last visit. He continues gabapentin for neuropathy and feels it helps.   HISTORY: (copied from  note on 08/20/2018) I had the pleasure seeing a patient, Glenn Robertson, at [facility] for neurologic consultation regarding his episodes of transient alteration of awareness, headaches and numbness.  He has had several spells of altered awareness where he feels he loses time over the past couple years.   He denies loss of consciousness.  There is no generalized tonic-clonic activity and no tongue biting or incontinence.  He will have a lapse of seconds but will be non-interactive and won't hear what others are saying.    The first episode occurred in 2012.   He has had several others, the last one about 2 months ago.   He was sitting at the table and felt everything went black but others at the table were not aware of his changed.   His mom witnessed an event at a restaurant where he stared blankly for several seconds and had no memory of the event.   He does not recall having an EEG.     He also has a problem with headaches on the right.  The headaches occur weekly and last much of the day.   He may takes an NSAID but it does not always help.   He denies nausea or photophobia.      He also reports numbness in his legs.   He has a h/o disc bulges in the past.   In 2012, he has the onset of numbness in the lateral left foot felt to be due to sciatica or lumbar changes.    This year, he has had numbness in both feet.   He denies foot weakness.   No changes in his gait.   He has tenderness in the right  lateral hip.      He has paranoid schizophrenia that is doing well.    He has been on Latuda.     He snores but has no witnessed OSA.    He sometimes dozes off in the evening.  REVIEW OF SYSTEMS: Out of a complete 14 system review of symptoms, the patient complains only of the following symptoms, and all other reviewed systems are negative.  ALLERGIES: Allergies  Allergen Reactions  . Citric Acid Other (See Comments)    Runny nose, eyes water  . Food     Oranges or anything with citric acid    HOME MEDICATIONS: Outpatient Medications Prior to Visit  Medication Sig Dispense Refill  . ACCU-CHEK SOFTCLIX LANCETS lancets 3 (three) times daily. for testing  0  . albuterol (PROVENTIL HFA;VENTOLIN HFA) 108 (90 Base) MCG/ACT inhaler Inhale 2 puffs into the lungs as needed. 1 Inhaler 2  . ASPIRIN 81 PO Take 1 tablet by mouth daily.    Marland Kitchen buPROPion (WELLBUTRIN XL) 150 MG 24 hr tablet Take 450 mg by mouth every morning.  1  . clonazePAM (KLONOPIN) 1 MG tablet TAKE 1 TABLET(1 MG) BY MOUTH TWICE DAILY AS NEEDED 30 tablet 0  . empagliflozin (JARDIANCE) 10 MG TABS tablet Take 10 mg by mouth  daily. 90 tablet 1  . fluticasone (FLONASE) 50 MCG/ACT nasal spray Place 2 sprays into both nostrils daily. 16 g 6  . glipiZIDE (GLUCOTROL) 10 MG tablet Take 1 tablet (10 mg total) by mouth 2 (two) times daily. 180 tablet 3  . glucose blood (ACCU-CHEK AVIVA PLUS) test strip Use to test blood sugar up to TID.  Testing more frequently due to symptoms of hypoglycemia.  Diagnosis: E11.9 100 each 6  . LUMIGAN 0.01 % SOLN   0  . Meloxicam (MOBIC PO) Take 1 Dose by mouth as needed. FOR HIP PAIN    . metFORMIN (GLUCOPHAGE-XR) 500 MG 24 hr tablet Take 2 tablets (1,000 mg total) by mouth daily with breakfast. 180 tablet 2  . montelukast (SINGULAIR) 10 MG tablet Take 1 tablet (10 mg total) by mouth at bedtime. 90 tablet 3  . [name] 3 MG TABS TAKE 1 TABLET BY MOUTH EVERY DAY 30 tablet 0  . simvastatin (ZOCOR) 20 MG  tablet Take 1 tablet (20 mg total) by mouth every evening. 90 tablet 3  . tadalafil (ADCIRCA/CIALIS) 20 MG tablet Take 0.5-1 tablets (10-20 mg total) by mouth every other day as needed for erectile dysfunction. 5 tablet 3   No facility-administered medications prior to visit.     PAST MEDICAL HISTORY: Past Medical History:  Diagnosis Date  . Allergy   . Anxiety   . Asthma   . Chronic pain   . Depression   . Diabetes mellitus   . Neuromuscular disorder ([place])     PAST SURGICAL HISTORY: Past Surgical History:  Procedure Laterality Date  . Arm Surgery  1/12   rt ulnar nerve decompression  . EPIDURAL BLOCK INJECTION     several  . ULNAR NERVE TRANSPOSITION  01/19/2012   Procedure: ULNAR NERVE DECOMPRESSION/TRANSPOSITION;  Surgeon: Cammie Sickle., MD;  Location: [facility];  Service: Orthopedics;  Laterality: Left;  ulnar nerve decompression vs transposition left elbow    FAMILY HISTORY: Family History  Problem Relation Age of Onset  . Diabetes Father   . Diabetes Paternal Uncle     SOCIAL HISTORY: Social History   Socioeconomic History  . Marital status: Single    Spouse name: Not on file  . Number of children: 0  . Years of education: BA  . Highest education level: Not on file  Occupational History  . Occupation: Unemlpoyed-disabled  Social Needs  . Financial resource strain: Not on file  . Food insecurity:    Worry: Not on file    Inability: Not on file  . Transportation needs:    Medical: Not on file    Non-medical: Not on file  Tobacco Use  . Smoking status: Never Smoker  . Smokeless tobacco: Never Used  Substance and Sexual Activity  . Alcohol use: No  . Drug use: No  . Sexual activity: Not on file  Lifestyle  . Physical activity:    Days per week: Not on file    Minutes per session: Not on file  . Stress: Not on file  Relationships  . Social connections:    Talks on phone: Not on file    Gets together: Not on file    Attends  religious service: Not on file    Active member of club or organization: Not on file    Attends meetings of clubs or organizations: Not on file    Relationship status: Not on file  . Intimate partner violence:    Fear of  current or ex partner: Not on file    Emotionally abused: Not on file    Physically abused: Not on file    Forced sexual activity: Not on file  Other Topics Concern  . Not on file  Social History Narrative   Lives with mother   Caffeine use: Coke very rare   Right handed      DIAGNOSTIC DATA (LABS, IMAGING, TESTING) - I reviewed patient records, labs, notes, testing and imaging myself where available.  No flowsheet data found.   Lab Results  Component Value Date   HGB 13.6 01/19/2012      Component Value Date/Time   NA 137 09/24/2018 0921   K 4.1 09/24/2018 0921   CL 102 09/24/2018 0921   CO2 25 09/24/2018 0921   GLUCOSE 143 (H) 09/24/2018 0921   BUN 12 09/24/2018 0921   CREATININE 1.16 09/24/2018 0921   CREATININE 1.10 03/02/2018 1451   CALCIUM 9.9 09/24/2018 0921   PROT 7.1 09/24/2018 0921   ALBUMIN 4.6 09/24/2018 0921   AST 18 09/24/2018 0921   ALT 19 09/24/2018 0921   ALKPHOS 62 09/24/2018 0921   BILITOT 0.5 09/24/2018 0921   GFRNONAA >90 01/16/2012 1615   GFRAA >90 01/16/2012 1615   Lab Results  Component Value Date   CHOL 118 09/24/2018   HDL 49.70 09/24/2018   LDLCALC 55 09/24/2018   TRIG 70.0 09/24/2018   CHOLHDL 2 09/24/2018   Lab Results  Component Value Date   HGBA1C 7.9 (H) 09/24/2018   Lab Results  Component Value Date   VITAMINB12 866 03/02/2018   Lab Results  Component Value Date   TSH 0.92 03/15/2018    ASSESSMENT AND PLAN 53 y.o. year old male  has a past medical history of Allergy, Anxiety, Asthma, Chronic pain, Depression, Diabetes mellitus, and Neuromuscular disorder ([place]). here with     ICD-10-CM   1. Transient alteration of awareness R40.4   2. Numbness R20.0     He is doing well. No recent episodes of  altered awareness. We will continue gabapentin as prescribed for numbness. He was advised to follow up closely with PCP for management of DMT2. He will follow up with [place] in 1 year, sooner if needed.    No orders of the defined types were placed in this encounter.    No orders of the defined types were placed in this encounter.     I spent 15 minutes with the patient. 50% of this time was spent counseling and educating patient on plan of care and medications.    Debbora Presto, FNP-C 11/19/2018, 10:50 AM [facility] [address] [place], [place] [address] [phone]

## 2018-11-19 NOTE — Progress Notes (Signed)
I have read the note, and I agree with the clinical assessment and plan.  Dyllin Gulley A. Ferrell Flam, MD, PhD, FAAN Certified in Neurology, Clinical Neurophysiology, Sleep Medicine, Pain Medicine and Neuroimaging  [facility] [address], [address] [place], [place] [address] [phone]  

## 2018-11-19 NOTE — Telephone Encounter (Signed)
Spoke with the patient and he stated that he didn't have any concerns at this time. But he would be available to do a telephone visit after 1 pm today.

## 2018-11-22 DIAGNOSIS — M25511 Pain in right shoulder: Secondary | ICD-10-CM | POA: Diagnosis not present

## 2018-11-26 ENCOUNTER — Ambulatory Visit (HOSPITAL_COMMUNITY): Payer: Medicare Other | Admitting: Psychiatry

## 2018-11-27 ENCOUNTER — Other Ambulatory Visit: Payer: Self-pay

## 2018-11-27 ENCOUNTER — Encounter (HOSPITAL_COMMUNITY): Payer: Self-pay | Admitting: Psychiatry

## 2018-11-27 ENCOUNTER — Ambulatory Visit (INDEPENDENT_AMBULATORY_CARE_PROVIDER_SITE_OTHER): Payer: Medicare Other | Admitting: Psychiatry

## 2018-11-27 DIAGNOSIS — R404 Transient alteration of awareness: Secondary | ICD-10-CM | POA: Diagnosis not present

## 2018-11-27 DIAGNOSIS — R450 Nervousness: Secondary | ICD-10-CM | POA: Diagnosis not present

## 2018-11-27 DIAGNOSIS — F251 Schizoaffective disorder, depressive type: Secondary | ICD-10-CM

## 2018-11-27 DIAGNOSIS — R51 Headache: Secondary | ICD-10-CM | POA: Diagnosis not present

## 2018-11-27 DIAGNOSIS — M25511 Pain in right shoulder: Secondary | ICD-10-CM | POA: Diagnosis not present

## 2018-11-27 MED ORDER — CARIPRAZINE HCL 3 MG PO CAPS: 3.0000 mg | ORAL_CAPSULE | Freq: Every day | ORAL | 0 refills | Status: DC

## 2018-11-27 MED ORDER — CLONAZEPAM 1 MG PO TABS: ORAL_TABLET | ORAL | 1 refills | Status: DC

## 2018-11-27 MED ORDER — SERTRALINE HCL 100 MG PO TABS: 100.0000 mg | ORAL_TABLET | Freq: Every day | ORAL | 0 refills | Status: DC

## 2018-11-27 NOTE — Patient Instructions (Signed)
Plan:  1. Continue taking Klonopin as you were.  2. Stop taking [name].  3. Instead of [name] we will try Vraylar: take 1.5 mg daily for a week then start taking 3 mg daily. We will give you a starting sample for first two weeks then you will have a prescription in pharmacy.  4. Please taper off Wellbutrin by taking two 150 mg tablets daily for a week, then start taking one 150 mg tablet for one week then stop.  5. Instead we will try Zoloft: please take 50 mg (1/2) tablet for a week then start taking a whole 100 mg tablet daily. Do it at the same time you start going down on the dose of Wellbutrin.

## 2018-11-27 NOTE — Progress Notes (Signed)
Psychiatric Initial Adult Assessment   Patient Identification: Glenn Robertson MRN:  [id number] Date of Evaluation:  11/27/2018 Referral Source: Dr. Luetta Nutting Chief Complaint:  Depression, anxiety - to establish care Visit Diagnosis:    ICD-10-CM   1. Schizoaffective disorder, depressive type (Glenn Robertson) F25.1 clonazePAM (KLONOPIN) 1 MG tablet  2. Transient alteration of awareness R40.4 clonazePAM (KLONOPIN) 1 MG tablet    History of Present Illness:  53 yo single male with past dx of schizophrenia paranoid type vs schizoaffective disorder who has been previously followed by Dr. Bernita Robertson. Patient reports that he was told by Dr. Pecola Robertson assistant that he is no longer in practice - this was in the Glenn Robertson last year. Glenn Robertson has refilled patient's p[rescription for psychotropic meds but he needs to have a regular psychiatrist. He is in counseling weekly. Patient has been seen in this office exactly a year ago by Glenn Robertson but then followed with Glenn Robertson. Not much changed in his history from the original H&P by Dr. Daron Robertson. Patient has never been hospitalized on psychiatric unit, he has no hx of suicidal attempts but admits to on and off SI, he has a long hx of depression and excessive worrying as well as paranoid and grandiose delusions.  He also expresses some bizarre perceptual disturbances e.g. that he suddenly stops hearing someone who he is talking to or the volume of conversation gradually decreases.    Glenn Robertson band does not remember much about medications he has been tried on. He knows that he was for many years on Abilify and gained weight on it. He then tried [place], possibly earlier was on risperidone, most recently on 3 mg of [name]. He believes that neither of these medications was of much help for paranoia. He also tried Paxil, possibly Lexapro and most recently Wellbutrin for depression.  Associated Signs/Symptoms: Depression Symptoms:  depressed  mood, anhedonia, insomnia, difficulty concentrating, anxiety, (Hypo) Manic Symptoms:  Delusions, Grandiosity, Anxiety Symptoms:  Excessive Worry, Panic Symptoms, Social Anxiety, Psychotic Symptoms:  Delusions, Paranoia, PTSD Symptoms: Negative  Past Psychiatric History: see above and H&P from 10/24/17 by Dr. Daron Robertson.  Previous Psychotropic Medications: Yes   Substance Abuse History in the last 12 months:  No.  Consequences of Substance Abuse: Negative  Past Medical History:  Past Medical History:  Diagnosis Date  . Allergy   . Anxiety   . Asthma   . Chronic pain   . Depression   . Diabetes mellitus   . Neuromuscular disorder Glenn Robertson)     Past Surgical History:  Procedure Laterality Date  . ANKLE ARTHROSCOPY    . Arm Surgery  1/12   rt ulnar nerve decompression  . EPIDURAL BLOCK INJECTION     several  . ULNAR NERVE TRANSPOSITION  01/19/2012   Procedure: ULNAR NERVE DECOMPRESSION/TRANSPOSITION;  Surgeon: Glenn Robertson., MD;  Location: Glenn Robertson;  Service: Orthopedics;  Laterality: Left;  ulnar nerve decompression vs transposition left elbow    Family Psychiatric History: None  Family History:  Family History  Problem Relation Age of Onset  . Diabetes Father   . Diabetes Paternal Uncle     Social History:   Social History   Socioeconomic History  . Marital status: Single    Spouse name: Not on file  . Number of Robertson: 0  . Years of education: BA  . Highest education level: Not on file  Occupational History  . Occupation: Unemlpoyed-disabled  Social Needs  .  Financial resource strain: Not on file  . Food insecurity:    Worry: Not on file    Inability: Not on file  . Transportation needs:    Medical: Not on file    Non-medical: Not on file  Tobacco Use  . Smoking status: Never Smoker  . Smokeless tobacco: Never Used  Substance and Sexual Activity  . Alcohol use: No  . Drug use: No  . Sexual activity: Not on file  Lifestyle   . Physical activity:    Days per week: Not on file    Minutes per session: Not on file  . Stress: Not on file  Relationships  . Social connections:    Talks on phone: Not on file    Gets together: Not on file    Attends religious service: Not on file    Active member of club or organization: Not on file    Attends meetings of clubs or organizations: Not on file    Relationship status: Not on file  Other Topics Concern  . Not on file  Social History Narrative   Lives with mother   Caffeine use: Coke very rare   Right handed     Additional Social History: Single, never married, no Robertson, went to [employer]. He lives with and takes care of his mother who does not drive.   Allergies:   Allergies  Allergen Reactions  . Citric Acid Other (See Comments)    Runny nose, eyes water  . Food     Oranges or anything with citric acid    Metabolic Disorder Labs: Lab Results  Component Value Date   HGBA1C 7.9 (H) 09/24/2018   MPG 123 03/02/2018   No results found for: PROLACTIN Lab Results  Component Value Date   CHOL 118 09/24/2018   TRIG 70.0 09/24/2018   HDL 49.70 09/24/2018   CHOLHDL 2 09/24/2018   VLDL 14.0 09/24/2018   LDLCALC 55 09/24/2018   Lab Results  Component Value Date   TSH 0.92 03/15/2018    Therapeutic Level Labs: No results found for: LITHIUM No results found for: CBMZ No results found for: VALPROATE  Current Medications: Current Outpatient Medications  Medication Sig Dispense Refill  . ACCU-CHEK SOFTCLIX LANCETS lancets 3 (three) times daily. for testing  0  . albuterol (PROVENTIL HFA;VENTOLIN HFA) 108 (90 Base) MCG/ACT inhaler Inhale 2 puffs into the lungs as needed. 1 Inhaler 2  . ASPIRIN 81 PO Take 1 tablet by mouth daily.    Marland Kitchen buPROPion (WELLBUTRIN XL) 150 MG 24 hr tablet Take 450 mg by mouth every morning.  1  . clonazePAM (KLONOPIN) 1 MG tablet TAKE 1 TABLET(1 MG) BY MOUTH TWICE DAILY AS NEEDED 60 tablet 1  . empagliflozin  (JARDIANCE) 10 MG TABS tablet Take 10 mg by mouth daily. 90 tablet 1  . fluticasone (FLONASE) 50 MCG/ACT nasal spray Place 2 sprays into both nostrils daily. 16 g 6  . gabapentin (NEURONTIN) 100 MG capsule Take 1 capsule (100 mg total) by mouth 3 (three) times daily. Start 1 capsule for 3-4 days then increase to 1 capsule twice daily for 3-4 days then increase to 1 capsule three times daily 90 capsule 11  . glipiZIDE (GLUCOTROL) 10 MG tablet Take 1 tablet (10 mg total) by mouth 2 (two) times daily. 180 tablet 3  . glucose blood (ACCU-CHEK AVIVA PLUS) test strip Use to test blood sugar up to TID.  Testing more frequently due to symptoms of hypoglycemia.  Diagnosis:  E11.9 100 each 6  . LUMIGAN 0.01 % SOLN   0  . Meloxicam (MOBIC PO) Take 1 Dose by mouth as needed. FOR HIP PAIN    . metFORMIN (GLUCOPHAGE-XR) 500 MG 24 hr tablet Take 2 tablets (1,000 mg total) by mouth daily with breakfast. 180 tablet 2  . montelukast (SINGULAIR) 10 MG tablet Take 1 tablet (10 mg total) by mouth at bedtime. 90 tablet 3  . [name] 3 MG TABS TAKE 1 TABLET BY MOUTH EVERY DAY 30 tablet 0  . simvastatin (ZOCOR) 20 MG tablet Take 1 tablet (20 mg total) by mouth every evening. 90 tablet 3  . tadalafil (ADCIRCA/CIALIS) 20 MG tablet Take 0.5-1 tablets (10-20 mg total) by mouth every other day as needed for erectile dysfunction. 5 tablet 3  . cariprazine (VRAYLAR) capsule Take 1 capsule (3 mg total) by mouth daily for 30 days. 30 capsule 0  . sertraline (ZOLOFT) 100 MG tablet Take 1 tablet (100 mg total) by mouth daily for 30 days. 30 tablet 0   No current facility-administered medications for this visit.     Musculoskeletal: Strength & Muscle Tone: within normal limits Gait & Station: normal Patient leans: N/A  Psychiatric Specialty Exam: Review of Systems  Constitutional: Negative.   HENT: Negative.   Eyes: Negative.   Respiratory: Negative.   Cardiovascular: Negative.   Gastrointestinal: Negative.    Genitourinary: Negative.   Musculoskeletal: Negative.   Skin: Negative.   Neurological: Positive for headaches.  Endo/Heme/Allergies: Negative.   Psychiatric/Behavioral: Positive for depression. The patient is nervous/anxious.     Blood pressure (!) 144/83, pulse 91, temperature 98.1 F (36.7 C), height 5\' 10"  (1.778 m), weight 198 lb (89.8 kg).Body mass index is 28.41 kg/m.  General Appearance: Casual and Fairly Groomed  Eye Contact:  Fair  Speech:  Clear and Coherent  Volume:  Normal  Mood:  Anxious and Depressed  Affect:  Non-Congruent and Labile  Thought Process:  Descriptions of Associations: Circumstantial  Orientation:  Full (Time, Place, and Person)  Thought Content:  Delusions  Suicidal Thoughts:  No  Homicidal Thoughts:  No  Memory:  Immediate;   Fair Recent;   Fair Remote;   Fair  Judgement:  Fair  Insight:  Fair  Psychomotor Activity:  Normal  Concentration:  Concentration: Fair  Recall:  Good  Fund of Knowledge:Fair  Language: Good  Akathisia:  Negative  Handed:  Right  AIMS (if indicated):  not done  Assets:  Communication Skills Desire for Improvement Housing Resilience  ADL's:  Intact  Cognition: WNL  Sleep:  Fair   Screenings: PHQ2-9     N[facility]09[facility] Visit from 10/28/2016 in [place] [facility] 04/18/2016 in [place] Office Visit from 03/02/2016 in [place]  PHQ-2 Total Score  0  0  0  0  0      Assessment and Plan: 53 yo single male with past dx of schizophrenia paranoid type vs schizoaffective disorder who has been previously followed by Dr. Bernita Robertson. Patient reports that he was told by Dr. Pecola Robertson assistant that he is no longer in practice - this was in the Glenn Robertson last year. Glenn Robertson has refilled patient's p[rescription for  psychotropic meds but he needs to have a regular psychiatrist. He is in counseling weekly. Patient has been seen in this office exactly a  year ago by Glenn Robertson but then followed with Glenn Robertson. Not much changed in his history from the original H&P by Dr. Daron Robertson. Patient has never been hospitalized on psychiatric unit, he has no hx of suicidal attempts but admits to on and off SI, he has a long hx of depression and excessive worrying as well as paranoid and grandiose delusions.  He also expresses some bizarre perceptual disturbances e.g. that he suddenly stops hearing someone who he is talking to or the volume of conversation gradually decreases.    Dx: Schizoaffective disorder depressed type  Plan: Continue clonazepam prn anxiety and insomnia. Dc [name] (no benefit) and instead try Vraylar (1.5 mg x 1 week then 3 mg daily) for psychosis and antidepressant augmentation. Two week sample (1.5 mg and 3 mg) and Rx written.Taper off Wellbutrin by 150 mg weekly and start Zoloft 50 mg x 1 week then 100 mg daily for depression/anxiety. Return to clinic in one month. The plan was discussed with patient. I spend 45 minutes in direct face to face clinical contact with the patient and devoted approximately 50% of this time to explanation of diagnosis, discussion of treatment options and med education.   Stephanie Acre, MD 3/31/20202:46 PM

## 2018-11-28 ENCOUNTER — Ambulatory Visit (INDEPENDENT_AMBULATORY_CARE_PROVIDER_SITE_OTHER): Payer: Medicare Other | Admitting: Psychology

## 2018-11-28 DIAGNOSIS — F411 Generalized anxiety disorder: Secondary | ICD-10-CM | POA: Diagnosis not present

## 2018-11-28 DIAGNOSIS — F4310 Post-traumatic stress disorder, unspecified: Secondary | ICD-10-CM

## 2018-11-28 DIAGNOSIS — F200 Paranoid schizophrenia: Secondary | ICD-10-CM | POA: Diagnosis not present

## 2018-11-28 DIAGNOSIS — F331 Major depressive disorder, recurrent, moderate: Secondary | ICD-10-CM | POA: Diagnosis not present

## 2018-12-01 IMAGING — MR MR LUMBAR SPINE W/O CM
5 series · 48 of 48 positions shown · non-contrast
Comparison: 02/16/2017

CLINICAL DATA: Chronic back pain.  Right buttock pain.

EXAM:
MRI LUMBAR SPINE WITHOUT CONTRAST
TECHNIQUE: Multiplanar, multisequence MR imaging of the lumbar spine was
performed. No intravenous contrast was administered.

[Series 3: T2 post-contrast · sagittal · 4.0mm · 0.88mm/px · 5 of 13 slices shown]
[im 1/13]
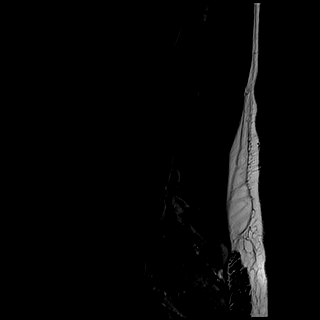
[im 4/13]
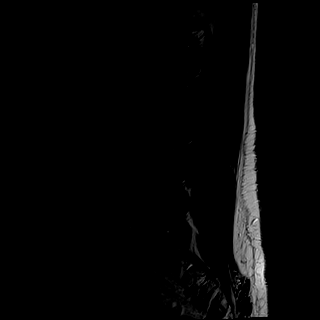
[im 7/13]
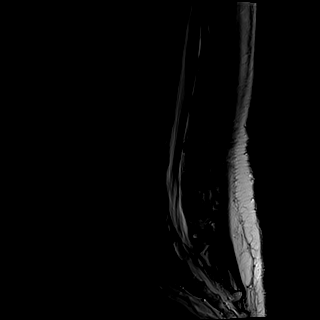
[im 10/13]
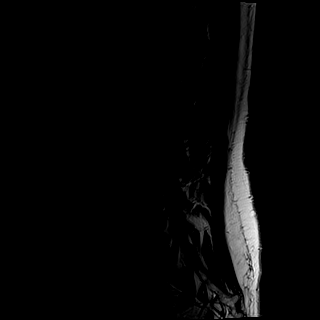
[im 13/13]
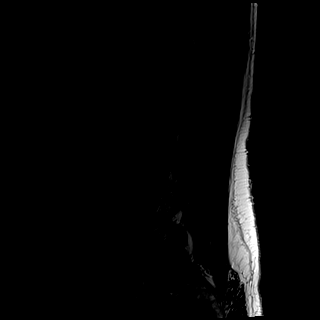

[Series 4: T1 · sagittal · 4.0mm · 0.88mm/px · 5 of 13 slices shown (1 of 2)]
[im 1/13]
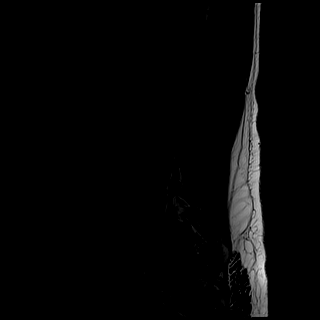
[im 4/13]
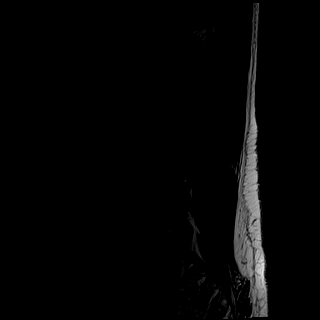
[im 7/13]
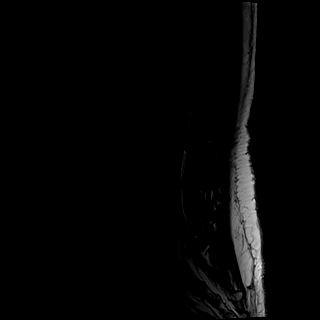
[im 10/13]
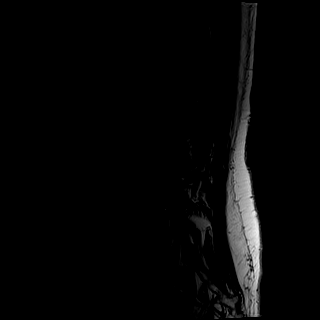
[im 13/13]
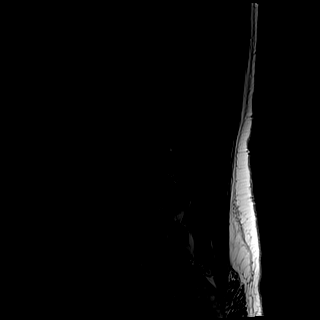

[Series 5: tirm sag · sagittal · 4.0mm · 1.09mm/px · 6 of 13 slices shown]
[im 1/13]
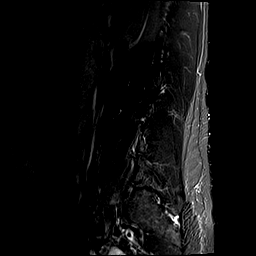
[im 3/13]
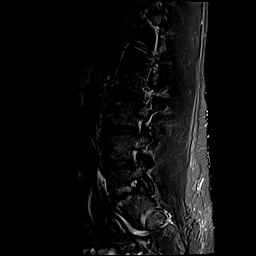
[im 5/13]
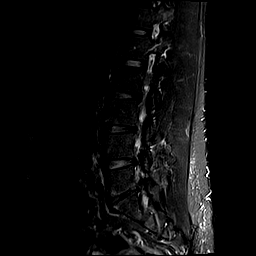
[im 8/13]
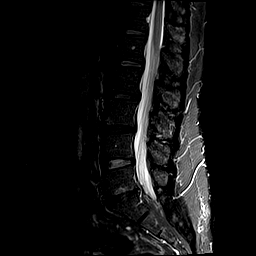
[im 10/13]
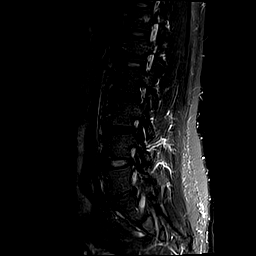
[im 13/13]
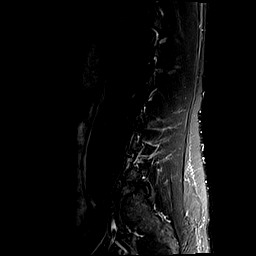

[Series 6: T1 · axial · 4.0mm · 0.78mm/px · z∈[-81,+136]mm · 16 of 37 slices shown (2 of 2)]
[im 1/37]
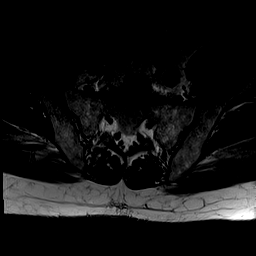
[im 3/37]
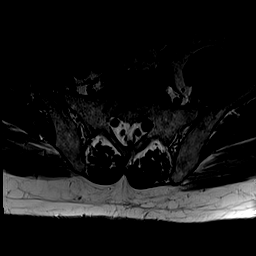
[im 5/37]
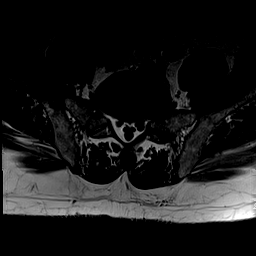
[im 8/37]
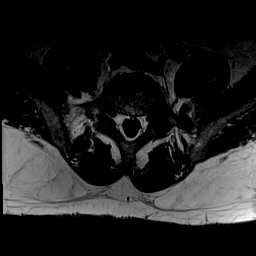
[im 10/37]
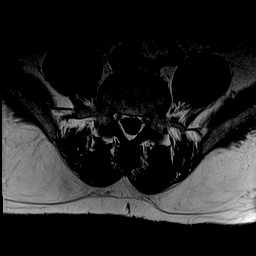
[im 13/37]
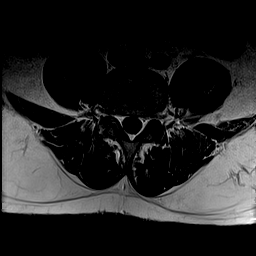
[im 15/37]
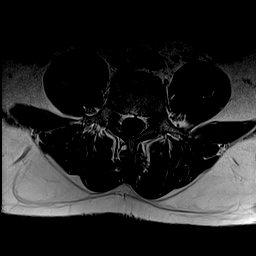
[im 17/37]
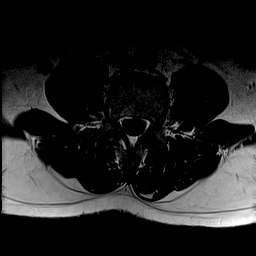
[im 20/37]
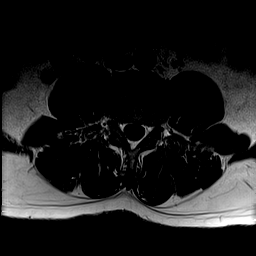
[im 22/37]
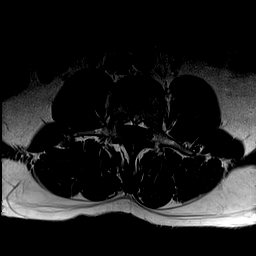
[im 25/37]
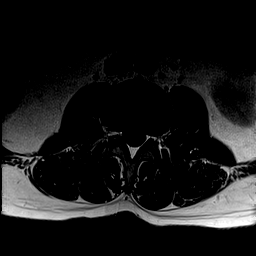
[im 27/37]
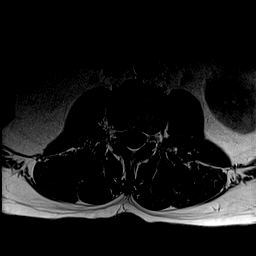
[im 29/37]
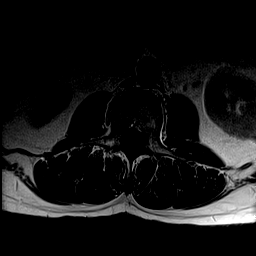
[im 32/37]
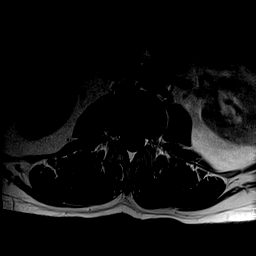
[im 34/37]
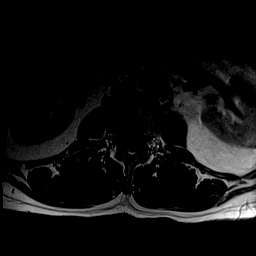
[im 37/37]
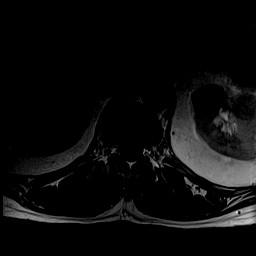

[Series 7: T2 · axial · 4.0mm · 0.78mm/px · z∈[-81,+136]mm · 16 of 37 slices shown]
[im 1/37]
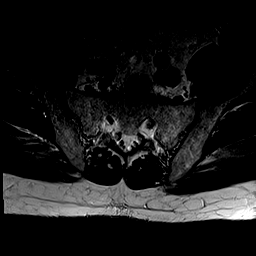
[im 3/37]
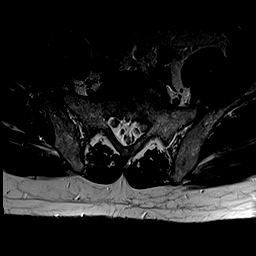
[im 5/37]
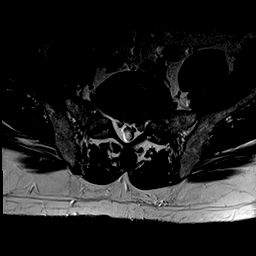
[im 8/37]
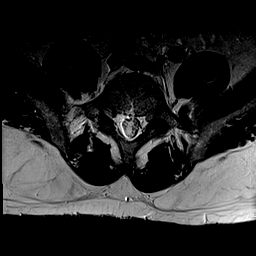
[im 10/37]
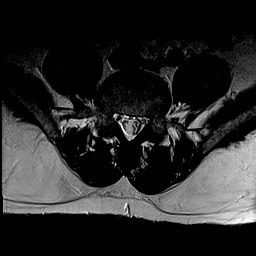
[im 13/37]
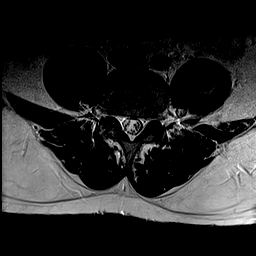
[im 15/37]
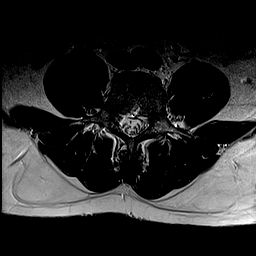
[im 17/37]
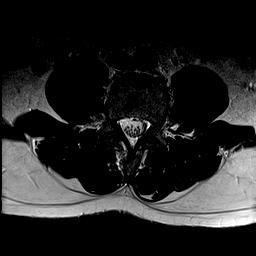
[im 20/37]
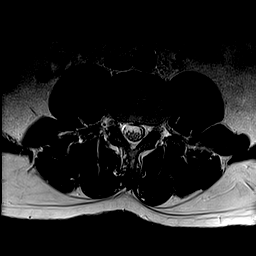
[im 22/37]
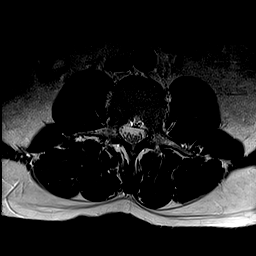
[im 25/37]
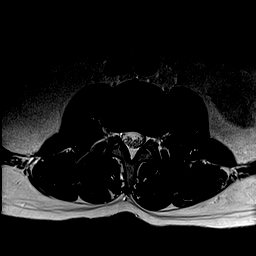
[im 27/37]
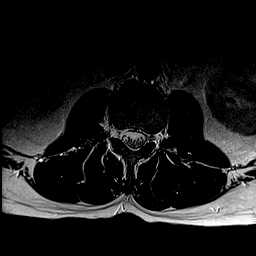
[im 29/37]
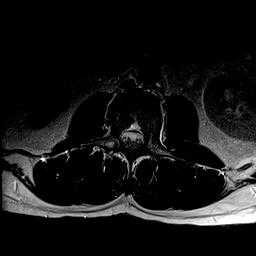
[im 32/37]
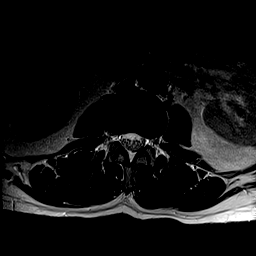
[im 34/37]
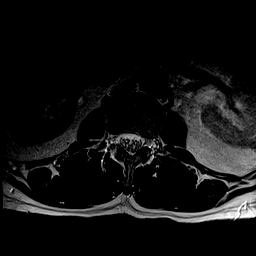
[im 37/37]
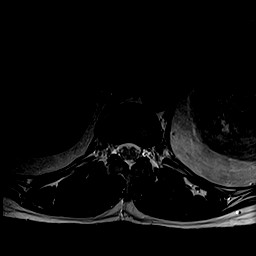

[48 of 48 positions shown; findings below may reference images not displayed]

FINDINGS: Segmentation:  Standard.

Alignment:  Physiologic.

Vertebrae:  No fracture, evidence of discitis, or bone lesion.

Conus medullaris and cauda equina: Conus extends to the L1 level.
Conus and cauda equina appear normal.

Paraspinal and other soft tissues: No acute paraspinal abnormality.

Other: Mild osteoarthritis of bilateral sacroiliac joints.

Disc levels:

Disc spaces: Degenerative disease with disc height loss at L5-S1.

T12-L1: No significant disc bulge. No evidence of neural foraminal
stenosis. No central canal stenosis.

L1-L2: No significant disc bulge. No evidence of neural foraminal
stenosis. No central canal stenosis.

L2-L3: Minimal broad-based disc bulge. No evidence of neural
foraminal stenosis. No central canal stenosis.

L3-L4: Mild broad-based disc bulge. No evidence of neural foraminal
stenosis. No central canal stenosis.

L4-L5: Mild broad-based disc bulge. No evidence of neural foraminal
stenosis. No central canal stenosis.

L5-S1: Broad disc protrusion eccentric towards the left. Disc abuts
bilateral S1 nerve roots. No evidence of neural foraminal stenosis.
No central canal stenosis.
IMPRESSION: 1. At L5-S1 there is a broad disc protrusion eccentric towards the
left with the disc abutting bilateral S1 nerve roots.

## 2018-12-04 DIAGNOSIS — M25511 Pain in right shoulder: Secondary | ICD-10-CM | POA: Diagnosis not present

## 2018-12-12 DIAGNOSIS — M25511 Pain in right shoulder: Secondary | ICD-10-CM | POA: Diagnosis not present

## 2018-12-20 DIAGNOSIS — N401 Enlarged prostate with lower urinary tract symptoms: Secondary | ICD-10-CM | POA: Diagnosis not present

## 2018-12-20 DIAGNOSIS — R3911 Hesitancy of micturition: Secondary | ICD-10-CM | POA: Diagnosis not present

## 2018-12-21 DIAGNOSIS — M25511 Pain in right shoulder: Secondary | ICD-10-CM | POA: Diagnosis not present

## 2018-12-25 ENCOUNTER — Ambulatory Visit (INDEPENDENT_AMBULATORY_CARE_PROVIDER_SITE_OTHER): Payer: Medicare Other | Admitting: Family Medicine

## 2018-12-25 ENCOUNTER — Encounter: Payer: Self-pay | Admitting: Family Medicine

## 2018-12-25 VITALS — BP 144/83 | Ht 70.0 in | Wt 193.0 lb

## 2018-12-25 DIAGNOSIS — F251 Schizoaffective disorder, depressive type: Secondary | ICD-10-CM | POA: Diagnosis not present

## 2018-12-25 DIAGNOSIS — R3911 Hesitancy of micturition: Secondary | ICD-10-CM

## 2018-12-25 DIAGNOSIS — E119 Type 2 diabetes mellitus without complications: Secondary | ICD-10-CM

## 2018-12-25 MED ORDER — METFORMIN HCL ER 500 MG PO TB24: 1000.0000 mg | ORAL_TABLET | Freq: Every day | ORAL | 2 refills | Status: DC

## 2018-12-25 MED ORDER — EMPAGLIFLOZIN 25 MG PO TABS: 25.0000 mg | ORAL_TABLET | Freq: Every day | ORAL | 6 refills | Status: DC

## 2018-12-25 NOTE — Assessment & Plan Note (Signed)
-  Stable, managed by psychiatry.  

## 2018-12-25 NOTE — Assessment & Plan Note (Signed)
-  Improved since starting flomax.

## 2018-12-25 NOTE — Progress Notes (Signed)
Glenn Robertson - 53 y.o. male MRN [id number]  Date of birth: Jun 07, 1966   This visit type was conducted due to national recommendations for restrictions regarding the COVID-19 Pandemic (e.g. social distancing).  This format is felt to be most appropriate for this patient at this time.  All issues noted in this document were discussed and addressed.  No physical exam was performed (except for noted visual exam findings with Video Visits).  I discussed the limitations of evaluation and management by telemedicine and the availability of in person appointments. The patient expressed understanding and agreed to proceed.  I connected with@ on 12/25/18 at  9:00 AM EDT by a video enabled telemedicine application and verified that I am speaking with the correct person using two identifiers.  Interactive audio and video telecommunications were attempted between this provider and patient, however failed, due to patient having technical difficulties OR patient did not have access to video capability.  We continued and completed visit with audio only.     Patient Location: Home [name] [address]   Provider location:   Home office  Chief Complaint  Patient presents with  . Diabetes    3 MO F/U, GBS running high (120's-150's)    HPI  Georg Hartsell is a 53 y.o. male who presents via audio/video conferencing for a telehealth visit today.  He is following up today for diabetes.  He reports that blood sugars have improved since starting jardiance but still continue to run a little high.  Current range is 120-150.  He is not exercising as much recently.  He also quit taking metformin because he was concerned it was making his joints hurt.  He would be open to restarting this.   IN regards to his mental health, he has now established with a new behavioral health provider.  Reports stable symptoms with current medications.   He was also recently seen by urology for urinary frequency and was  told that he had a mildly enlarged prostate.  He is taking flomax now.  Still has some issues with frequency from time to time.     ROS:  A comprehensive ROS was completed and negative except as noted per HPI  Past Medical History:  Diagnosis Date  . Allergy   . Anxiety   . Asthma   . Chronic pain   . Depression   . Diabetes mellitus   . Neuromuscular disorder [facility])     Past Surgical History:  Procedure Laterality Date  . ANKLE ARTHROSCOPY    . Arm Surgery  1/12   rt ulnar nerve decompression  . EPIDURAL BLOCK INJECTION     several  . ULNAR NERVE TRANSPOSITION  01/19/2012   Procedure: ULNAR NERVE DECOMPRESSION/TRANSPOSITION;  Surgeon: Cammie Sickle., MD;  Location: [facility];  Service: Orthopedics;  Laterality: Left;  ulnar nerve decompression vs transposition left elbow    Family History  Problem Relation Age of Onset  . Diabetes Father   . Diabetes Paternal Uncle     Social History   Socioeconomic History  . Marital status: Single    Spouse name: Not on file  . Number of children: 0  . Years of education: BA  . Highest education level: Not on file  Occupational History  . Occupation: Unemlpoyed-disabled  Social Needs  . Financial resource strain: Not on file  . Food insecurity:    Worry: Not on file    Inability: Not on file  . Transportation needs:  Medical: Not on file    Non-medical: Not on file  Tobacco Use  . Smoking status: Never Smoker  . Smokeless tobacco: Never Used  Substance and Sexual Activity  . Alcohol use: No  . Drug use: No  . Sexual activity: Not on file  Lifestyle  . Physical activity:    Days per week: Not on file    Minutes per session: Not on file  . Stress: Not on file  Relationships  . Social connections:    Talks on phone: Not on file    Gets together: Not on file    Attends religious service: Not on file    Active member of club or organization: Not on file    Attends meetings of clubs or  organizations: Not on file    Relationship status: Not on file  . Intimate partner violence:    Fear of current or ex partner: Not on file    Emotionally abused: Not on file    Physically abused: Not on file    Forced sexual activity: Not on file  Other Topics Concern  . Not on file  Social History Narrative   Lives with mother   Caffeine use: Coke very rare   Right handed      Current Outpatient Medications:  .  ACCU-CHEK SOFTCLIX LANCETS lancets, 3 (three) times daily. for testing, Disp: , Rfl: 0 .  albuterol (PROVENTIL HFA;VENTOLIN HFA) 108 (90 Base) MCG/ACT inhaler, Inhale 2 puffs into the lungs as needed., Disp: 1 Inhaler, Rfl: 2 .  ASPIRIN 81 PO, Take 1 tablet by mouth daily., Disp: , Rfl:  .  buPROPion (WELLBUTRIN XL) 150 MG 24 hr tablet, Take 450 mg by mouth every morning., Disp: , Rfl: 1 .  cariprazine (VRAYLAR) capsule, Take 1 capsule (3 mg total) by mouth daily for 30 days., Disp: 30 capsule, Rfl: 0 .  clonazePAM (KLONOPIN) 1 MG tablet, TAKE 1 TABLET(1 MG) BY MOUTH TWICE DAILY AS NEEDED, Disp: 60 tablet, Rfl: 1 .  empagliflozin (JARDIANCE) 10 MG TABS tablet, Take 10 mg by mouth daily., Disp: 90 tablet, Rfl: 1 .  fluticasone (FLONASE) 50 MCG/ACT nasal spray, Place 2 sprays into both nostrils daily., Disp: 16 g, Rfl: 6 .  glipiZIDE (GLUCOTROL) 10 MG tablet, Take 1 tablet (10 mg total) by mouth 2 (two) times daily., Disp: 180 tablet, Rfl: 3 .  glucose blood (ACCU-CHEK AVIVA PLUS) test strip, Use to test blood sugar up to TID.  Testing more frequently due to symptoms of hypoglycemia.  Diagnosis: E11.9, Disp: 100 each, Rfl: 6 .  LUMIGAN 0.01 % SOLN, , Disp: , Rfl: 0 .  Meloxicam (MOBIC PO), Take 1 Dose by mouth as needed. FOR HIP PAIN, Disp: , Rfl:  .  montelukast (SINGULAIR) 10 MG tablet, Take 1 tablet (10 mg total) by mouth at bedtime., Disp: 90 tablet, Rfl: 3 .  sertraline (ZOLOFT) 100 MG tablet, Take 1 tablet (100 mg total) by mouth daily for 30 days., Disp: 30 tablet, Rfl: 0  .  simvastatin (ZOCOR) 20 MG tablet, Take 1 tablet (20 mg total) by mouth every evening., Disp: 90 tablet, Rfl: 3 .  tadalafil (ADCIRCA/CIALIS) 20 MG tablet, Take 0.5-1 tablets (10-20 mg total) by mouth every other day as needed for erectile dysfunction., Disp: 5 tablet, Rfl: 3 .  tamsulosin (FLOMAX) 0.4 MG CAPS capsule, TK 1 C PO Q NIGHT, Disp: , Rfl:  .  gabapentin (NEURONTIN) 100 MG capsule, Take 1 capsule (100 mg total) by mouth  3 (three) times daily. Start 1 capsule for 3-4 days then increase to 1 capsule twice daily for 3-4 days then increase to 1 capsule three times daily (Patient not taking: Reported on 12/25/2018), Disp: 90 capsule, Rfl: 11 .  metFORMIN (GLUCOPHAGE-XR) 500 MG 24 hr tablet, Take 2 tablets (1,000 mg total) by mouth daily with breakfast., Disp: 180 tablet, Rfl: 2 .  [name] 3 MG TABS, TAKE 1 TABLET BY MOUTH EVERY DAY (Patient not taking: Reported on 12/25/2018), Disp: 30 tablet, Rfl: 0  EXAM:  VITALS per patient if applicable: BP (!) 051/10   Ht 5\' 10"  (1.778 m)   Wt 193 lb (87.5 kg) Comment: Pt wt @ hm  BMI 27.69 kg/m   GENERAL: alert, oriented, appears well and in no acute distress  PSYCH/NEURO: pleasant and cooperative, no obvious depression or anxiety, speech and thought processing grossly intact  ASSESSMENT AND PLAN:  Discussed the following assessment and plan:  Schizoaffective disorder, depressive type ([place]) -Stable, managed by psychiatry.   Urinary hesitancy -Improved since starting flomax.   Type 2 diabetes mellitus without complication, without long-term current use of insulin ([facility]) -Glucose still running a little high.  -Recommend increase in jardiance to 25mg  -Restart metformin -Work on incorporating more exercise and follow low carb diet.  -He will stop in to have updated a1c/bmp.         I discussed the assessment and treatment plan with the patient. The patient was provided an opportunity to ask questions and all were answered. The patient  agreed with the plan and demonstrated an understanding of the instructions.   The patient was advised to call back or seek an in-person evaluation if the symptoms worsen or if the condition fails to improve as anticipated.  I provided 23 minutes of non-face-to-face time during this encounter.   Luetta Nutting, DO

## 2018-12-25 NOTE — Assessment & Plan Note (Signed)
-  Glucose still running a little high.  -Recommend increase in jardiance to 25mg  -Restart metformin -Work on incorporating more exercise and follow low carb diet.  -He will stop in to have updated a1c/bmp.

## 2018-12-25 NOTE — Assessment & Plan Note (Signed)
>>  ASSESSMENT AND PLAN FOR SCHIZOAFFECTIVE DISORDER, DEPRESSIVE TYPE ([facility]) WRITTEN ON 12/25/2018  9:34 AM BY MATTHEWS, CODY, DO  -Stable, managed by psychiatry.

## 2018-12-26 ENCOUNTER — Telehealth: Payer: Self-pay | Admitting: Family Medicine

## 2018-12-26 ENCOUNTER — Other Ambulatory Visit: Payer: Self-pay

## 2018-12-26 ENCOUNTER — Ambulatory Visit (INDEPENDENT_AMBULATORY_CARE_PROVIDER_SITE_OTHER): Payer: Medicare Other | Admitting: Psychiatry

## 2018-12-26 DIAGNOSIS — E119 Type 2 diabetes mellitus without complications: Secondary | ICD-10-CM

## 2018-12-26 DIAGNOSIS — R404 Transient alteration of awareness: Secondary | ICD-10-CM | POA: Diagnosis not present

## 2018-12-26 DIAGNOSIS — M25511 Pain in right shoulder: Secondary | ICD-10-CM | POA: Diagnosis not present

## 2018-12-26 DIAGNOSIS — F251 Schizoaffective disorder, depressive type: Secondary | ICD-10-CM | POA: Diagnosis not present

## 2018-12-26 DIAGNOSIS — M79676 Pain in unspecified toe(s): Secondary | ICD-10-CM

## 2018-12-26 MED ORDER — CLONAZEPAM 1 MG PO TABS: 1.0000 mg | ORAL_TABLET | Freq: Two times a day (BID) | ORAL | 1 refills | Status: DC | PRN

## 2018-12-26 MED ORDER — CARIPRAZINE HCL 3 MG PO CAPS: 3.0000 mg | ORAL_CAPSULE | Freq: Every day | ORAL | 1 refills | Status: DC

## 2018-12-26 MED ORDER — SERTRALINE HCL 100 MG PO TABS: 100.0000 mg | ORAL_TABLET | Freq: Every day | ORAL | 1 refills | Status: DC

## 2018-12-26 NOTE — Telephone Encounter (Signed)
Copied from [place] [id number]. Topic: Quick Communication - See Telephone Encounter >> Dec 26, 2018  4:26 PM Margot Ables wrote: CRM for notification. See Telephone encounter for: 12/26/18.  Pt states he forgot to mention to Dr. Zigmund Daniel that in the past couple of years he has had 4-5 toenails come off either partially or completely. It has primarily been his baby toe on both feet. Pt states that sometimes the nail is loose and he has to remove it. He said it can be bloody and tender. He puts rubbing alcohol on it. He said it burns but he doesn't want an infection. He said right now he has lost almost the full nail on the baby toe of his right foot. It came off last night in the bed and he had blood on his bed sheets. He cleaned and has put a bandaid on it. No sign of infection. Pt asking for advice.

## 2018-12-26 NOTE — Progress Notes (Signed)
BH MD/PA/NP OP Progress Note  12/26/2018 8:47 AM Glenn Robertson  MRN:  [id number] Interview was conducted by telephone (patient has no videao teleconferencing access/ability) and I verified that I was speaking with the correct person using two identifiers. I discussed the limitations of evaluation and management by telemedicine and  the availability of in person appointments. Patient expressed understanding and agreed to proceed.  Chief Complaint: "I still don't like to be around people".  HPI: 53 yo single male with past dx of schizophrenia paranoid type vs schizoaffective disorder who has been previously followed by Dr. Bernita Raisin. Patient reports that he was told by Dr. Pecola Leisure assistant that he is no longer in practice - this was in the fFall last year. Dr. Zigmund Daniel has refilled patient's p[rescription for psychotropic meds but he needs to have a regular psychiatrist. He is in counseling weekly. Patient has been seen in this office exactly a year ago by Dr. Germaine Pomfret but then followed with Dr. Rosine Door. Not much changed in his history from the original H&P by Dr. Daron Offer. Patient has never been hospitalized on psychiatric unit, he has no hx of suicidal attempts but admits to on and off SI, he has a long hx of depression and excessive worrying as well as paranoid and grandiose delusions.  He expressed having  some bizarre perceptual disturbances e.g. that he suddenly stops hearing someone who he is talking to or the volume of conversation gradually decreases. We continued clonazepam prn anxiety and insomnia. Dc [name] (no benefit) and instead try Vraylar (1.5 mg x 1 week then 3 mg daily) for psychosis and antidepressant augmentation. Two week sample (1.5 mg and 3 mg) and Rx written.Taper off Wellbutrin by 150 mg weekly and start Zoloft 50 mg x 1 week then 100 mg daily for depression/anxiety. Patient has tolerated all the above changes well and reports full resolution of depressive sx, no SI and no AVH. He  remains somewhat anxious, isolative which serves him well in current pandemic environment. Hi sleep and appetite are good. He stays physically active (runs 1-2 miles).   Visit Diagnosis:    ICD-10-CM   1. Schizoaffective disorder, depressive type ([place]) F25.1     Past Psychiatric History: Please refer to intake H&P.  Past Medical History:  Past Medical History:  Diagnosis Date  . Allergy   . Anxiety   . Asthma   . Chronic pain   . Depression   . Diabetes mellitus   . Neuromuscular disorder [facility])     Past Surgical History:  Procedure Laterality Date  . ANKLE ARTHROSCOPY    . Arm Surgery  1/12   rt ulnar nerve decompression  . EPIDURAL BLOCK INJECTION     several  . ULNAR NERVE TRANSPOSITION  01/19/2012   Procedure: ULNAR NERVE DECOMPRESSION/TRANSPOSITION;  Surgeon: Cammie Sickle., MD;  Location: [facility];  Service: Orthopedics;  Laterality: Left;  ulnar nerve decompression vs transposition left elbow    Family Psychiatric History: None  Family History:  Family History  Problem Relation Age of Onset  . Diabetes Father   . Diabetes Paternal Uncle     Social History:  Social History   Socioeconomic History  . Marital status: Single    Spouse name: Not on file  . Number of children: 0  . Years of education: BA  . Highest education level: Not on file  Occupational History  . Occupation: Unemlpoyed-disabled  Social Needs  . Financial resource strain: Not on file  .  Food insecurity:    Worry: Not on file    Inability: Not on file  . Transportation needs:    Medical: Not on file    Non-medical: Not on file  Tobacco Use  . Smoking status: Never Smoker  . Smokeless tobacco: Never Used  Substance and Sexual Activity  . Alcohol use: No  . Drug use: No  . Sexual activity: Not on file  Lifestyle  . Physical activity:    Days per week: Not on file    Minutes per session: Not on file  . Stress: Not on file  Relationships  . Social  connections:    Talks on phone: Not on file    Gets together: Not on file    Attends religious service: Not on file    Active member of club or organization: Not on file    Attends meetings of clubs or organizations: Not on file    Relationship status: Not on file  Other Topics Concern  . Not on file  Social History Narrative   Lives with mother   Caffeine use: Coke very rare   Right handed     Allergies:  Allergies  Allergen Reactions  . Citric Acid Other (See Comments)    Runny nose, eyes water  . Food     Oranges or anything with citric acid    Metabolic Disorder Labs: Lab Results  Component Value Date   HGBA1C 7.9 (H) 09/24/2018   MPG 123 03/02/2018   No results found for: PROLACTIN Lab Results  Component Value Date   CHOL 118 09/24/2018   TRIG 70.0 09/24/2018   HDL 49.70 09/24/2018   CHOLHDL 2 09/24/2018   VLDL 14.0 09/24/2018   LDLCALC 55 09/24/2018   Lab Results  Component Value Date   TSH 0.92 03/15/2018    Therapeutic Level Labs: No results found for: LITHIUM No results found for: VALPROATE No components found for:  CBMZ  Current Medications: Current Outpatient Medications  Medication Sig Dispense Refill  . ACCU-CHEK SOFTCLIX LANCETS lancets 3 (three) times daily. for testing  0  . albuterol (PROVENTIL HFA;VENTOLIN HFA) 108 (90 Base) MCG/ACT inhaler Inhale 2 puffs into the lungs as needed. 1 Inhaler 2  . ASPIRIN 81 PO Take 1 tablet by mouth daily.    Marland Kitchen buPROPion (WELLBUTRIN XL) 150 MG 24 hr tablet Take 450 mg by mouth every morning.  1  . cariprazine (VRAYLAR) capsule Take 1 capsule (3 mg total) by mouth daily for 30 days. 30 capsule 0  . clonazePAM (KLONOPIN) 1 MG tablet TAKE 1 TABLET(1 MG) BY MOUTH TWICE DAILY AS NEEDED 60 tablet 1  . empagliflozin (JARDIANCE) 25 MG TABS tablet Take 25 mg by mouth daily. 30 tablet 6  . fluticasone (FLONASE) 50 MCG/ACT nasal spray Place 2 sprays into both nostrils daily. 16 g 6  . glipiZIDE (GLUCOTROL) 10 MG  tablet Take 1 tablet (10 mg total) by mouth 2 (two) times daily. 180 tablet 3  . glucose blood (ACCU-CHEK AVIVA PLUS) test strip Use to test blood sugar up to TID.  Testing more frequently due to symptoms of hypoglycemia.  Diagnosis: E11.9 100 each 6  . LUMIGAN 0.01 % SOLN   0  . Meloxicam (MOBIC PO) Take 1 Dose by mouth as needed. FOR HIP PAIN    . metFORMIN (GLUCOPHAGE-XR) 500 MG 24 hr tablet Take 2 tablets (1,000 mg total) by mouth daily with breakfast. 180 tablet 2  . montelukast (SINGULAIR) 10 MG tablet Take 1  tablet (10 mg total) by mouth at bedtime. 90 tablet 3  . sertraline (ZOLOFT) 100 MG tablet Take 1 tablet (100 mg total) by mouth daily for 30 days. 30 tablet 0  . simvastatin (ZOCOR) 20 MG tablet Take 1 tablet (20 mg total) by mouth every evening. 90 tablet 3  . tadalafil (ADCIRCA/CIALIS) 20 MG tablet Take 0.5-1 tablets (10-20 mg total) by mouth every other day as needed for erectile dysfunction. 5 tablet 3  . tamsulosin (FLOMAX) 0.4 MG CAPS capsule TK 1 C PO Q NIGHT     No current facility-administered medications for this visit.       Psychiatric Specialty Exam: Review of Systems  Psychiatric/Behavioral: The patient is nervous/anxious.   All other systems reviewed and are negative.   There were no vitals taken for this visit.There is no height or weight on file to calculate BMI.  General Appearance: NA  Eye Contact:  NA  Speech:  Clear and Coherent  Volume:  Normal  Mood:  Anxious  Affect:  NA  Thought Process:  Goal Directed  Orientation:  Full (Time, Place, and Person)  Thought Content: Possible mild paranoia   Suicidal Thoughts:  No  Homicidal Thoughts:  No  Memory:  Immediate;   Good Recent;   Good Remote;   Good  Judgement:  Good  Insight:  Fair  Psychomotor Activity:  NA  Concentration:  Concentration: Good  Recall:  Good  Fund of Knowledge: Fair  Language: Good  Akathisia:  NA  Handed:  Right  AIMS (if indicated): not done  Assets:  Communication  Skills Desire for Improvement Financial Resources/Insurance Housing Resilience  ADL's:  Intact  Cognition: WNL  Sleep:  Good   Screenings: PHQ2-9     [facility] 10/28[facility]2016 in [place] [facility] from 04/18/2016 in [place] Office Visit from 03/02/2016 in [place]  PHQ-2 Total Score  0  0  0  0  0       Assessment and Plan: 53 yo single male with past dx of schizophrenia paranoid type vs schizoaffective disorder who has been previously followed by Dr. Bernita Raisin. Patient reports that he was told by Dr. Pecola Leisure assistant that he is no longer in practice - this was in the fFall last year. Dr. Zigmund Daniel has refilled patient's p[rescription for psychotropic meds but he needs to have a regular psychiatrist. He is in counseling weekly. Patient has been seen in this office exactly a year ago by Dr. Germaine Pomfret but then followed with Dr. Rosine Door. Not much changed in his history from the original H&P by Dr. Daron Offer. Patient has never been hospitalized on psychiatric unit, he has no hx of suicidal attempts but admits to on and off SI, he has a long hx of depression and excessive worrying as well as paranoid and grandiose delusions.  He expressed having  some bizarre perceptual disturbances e.g. that he suddenly stops hearing someone who he is talking to or the volume of conversation gradually decreases. We continued clonazepam prn anxiety and insomnia. Dc [name] (no benefit) and instead try Vraylar (1.5 mg x 1 week then 3 mg daily) for psychosis and antidepressant augmentation. Two week sample (1.5 mg and 3 mg) and Rx written.Taper off Wellbutrin by 150 mg weekly and start Zoloft 50 mg x 1 week then 100 mg daily  for depression/anxiety. Patient has tolerated all the above changes well and reports full  resolution of depressive sx, no SI and no AVH. He remains somewhat anxious, isolative which serves him well in current pandemic environment. His sleep and appetite are good. He stays physically active (runs 1-2 miles). Plan: Continue Zoloft 100 mg, Vraylar 3 mg and Klonopin 1 mg bid unchanged. Next appointment in 2 months.   Stephanie Acre, MD 12/26/2018, 8:47 AM

## 2018-12-27 ENCOUNTER — Ambulatory Visit (INDEPENDENT_AMBULATORY_CARE_PROVIDER_SITE_OTHER): Payer: Medicaid Other | Admitting: Psychology

## 2018-12-27 DIAGNOSIS — F331 Major depressive disorder, recurrent, moderate: Secondary | ICD-10-CM | POA: Diagnosis not present

## 2018-12-27 DIAGNOSIS — F411 Generalized anxiety disorder: Secondary | ICD-10-CM | POA: Diagnosis not present

## 2018-12-27 DIAGNOSIS — F200 Paranoid schizophrenia: Secondary | ICD-10-CM

## 2018-12-27 DIAGNOSIS — F4310 Post-traumatic stress disorder, unspecified: Secondary | ICD-10-CM

## 2018-12-27 NOTE — Telephone Encounter (Signed)
Ok to enter referral.  Link to diabetes and toenail pain as diagnosis.

## 2018-12-27 NOTE — Telephone Encounter (Signed)
Communication - See Telephone Encounter >> Dec 26, 2018  4:26 PM Margot Ables wrote: CRM for notification. See Telephone encounter for: 12/26/18.  Pt states he forgot to mention to Dr. Zigmund Daniel that in the past couple of years he has had 4-5 toenails come off either partially or completely. It has primarily been his baby toe on both feet. Pt states that sometimes the nail is loose and he has to remove it. He said it can be bloody and tender. He puts rubbing alcohol on it. He said it burns but he doesn't want an infection. He said right now he has lost almost the full nail on the baby toe of his right foot. It came off last night in the bed and he had blood on his bed sheets. He cleaned and has put a bandaid on it. No sign of infection. Pt asking for advice.  Sent to Dr. Zigmund Daniel this [place] encounter above, to be advised for approval for Referral Podiatry. Request approved. Orders in. Will contact Pt to notify.

## 2019-01-02 ENCOUNTER — Telehealth: Payer: Self-pay | Admitting: Orthopaedic Surgery

## 2019-01-02 NOTE — Telephone Encounter (Signed)
Pt called to let Dr. Erlinda Hong know that the numbness has moved up his ankle.

## 2019-01-02 NOTE — Telephone Encounter (Signed)
See message.

## 2019-01-04 DIAGNOSIS — M25511 Pain in right shoulder: Secondary | ICD-10-CM | POA: Diagnosis not present

## 2019-01-07 ENCOUNTER — Telehealth: Payer: Self-pay | Admitting: Family Medicine

## 2019-01-07 NOTE — Telephone Encounter (Signed)
Copied from [place]. Topic: Quick Communication - See Telephone Encounter >> Jan 07, 2019 12:15 PM Sheran Luz wrote: CRM for notification. See Telephone encounter for: 01/07/19.   Patient called as Glenn Robertson to inform PCP that his blood sugar readings have been in 120's this week. He states that it still seems high for him.

## 2019-01-09 ENCOUNTER — Encounter: Payer: Self-pay | Admitting: Family Medicine

## 2019-01-11 ENCOUNTER — Other Ambulatory Visit: Payer: Self-pay | Admitting: Family Medicine

## 2019-01-11 DIAGNOSIS — N3943 Post-void dribbling: Secondary | ICD-10-CM | POA: Diagnosis not present

## 2019-01-11 DIAGNOSIS — N401 Enlarged prostate with lower urinary tract symptoms: Secondary | ICD-10-CM | POA: Diagnosis not present

## 2019-01-11 MED ORDER — METFORMIN HCL ER 500 MG PO TB24: 2000.0000 mg | ORAL_TABLET | Freq: Every day | ORAL | 1 refills | Status: DC

## 2019-01-25 ENCOUNTER — Ambulatory Visit (INDEPENDENT_AMBULATORY_CARE_PROVIDER_SITE_OTHER): Payer: Medicaid Other | Admitting: Psychology

## 2019-01-25 DIAGNOSIS — F200 Paranoid schizophrenia: Secondary | ICD-10-CM

## 2019-01-25 DIAGNOSIS — F4310 Post-traumatic stress disorder, unspecified: Secondary | ICD-10-CM

## 2019-01-25 DIAGNOSIS — F331 Major depressive disorder, recurrent, moderate: Secondary | ICD-10-CM

## 2019-01-25 DIAGNOSIS — F411 Generalized anxiety disorder: Secondary | ICD-10-CM

## 2019-02-19 DIAGNOSIS — H43813 Vitreous degeneration, bilateral: Secondary | ICD-10-CM | POA: Diagnosis not present

## 2019-02-19 DIAGNOSIS — H2513 Age-related nuclear cataract, bilateral: Secondary | ICD-10-CM | POA: Diagnosis not present

## 2019-02-19 DIAGNOSIS — H401231 Low-tension glaucoma, bilateral, mild stage: Secondary | ICD-10-CM | POA: Diagnosis not present

## 2019-02-20 ENCOUNTER — Ambulatory Visit (INDEPENDENT_AMBULATORY_CARE_PROVIDER_SITE_OTHER): Payer: Medicare Other | Admitting: Psychiatry

## 2019-02-20 ENCOUNTER — Other Ambulatory Visit: Payer: Self-pay

## 2019-02-20 DIAGNOSIS — F251 Schizoaffective disorder, depressive type: Secondary | ICD-10-CM

## 2019-02-20 DIAGNOSIS — R404 Transient alteration of awareness: Secondary | ICD-10-CM | POA: Diagnosis not present

## 2019-02-20 MED ORDER — CLONAZEPAM 1 MG PO TABS: 1.0000 mg | ORAL_TABLET | Freq: Two times a day (BID) | ORAL | 1 refills | Status: DC | PRN

## 2019-02-20 MED ORDER — SERTRALINE HCL 100 MG PO TABS: 100.0000 mg | ORAL_TABLET | Freq: Every day | ORAL | 1 refills | Status: DC

## 2019-02-20 MED ORDER — TRAZODONE HCL 100 MG PO TABS: 100.0000 mg | ORAL_TABLET | Freq: Every evening | ORAL | 1 refills | Status: DC | PRN

## 2019-02-20 MED ORDER — CARIPRAZINE HCL 3 MG PO CAPS: 3.0000 mg | ORAL_CAPSULE | Freq: Every day | ORAL | 1 refills | Status: DC

## 2019-02-20 NOTE — Progress Notes (Signed)
[place] MD/PA/NP OP Progress Note  02/20/2019 10:54 AM Glenn Robertson  MRN:  [id number] Interview was conducted by phone and I verified that I was speaking with the correct person using two identifiers. I discussed the limitations of evaluation and management by telemedicine and  the availability of in person appointments. Patient expressed understanding and agreed to proceed.  Chief Complaint: Insomnia.  HPI: 53 yo single male with past dx of schizophrenia paranoid type vs schizoaffective disorder who has been previously followed by Dr. Bernita Raisin. Patient reports that he was told by Dr. Pecola Leisure assistant that he is no longer in practice - this was in the Fall last year. Dr. Zigmund Daniel has refilled patient's p[rescription for psychotropic meds but he needs to have a regular psychiatrist. He is in counseling weekly. Patient has been seen in this office exactly a year ago by Dr. Germaine Pomfret but then followed with Dr. Rosine Door. Not much changed in his history from the original H&P by Dr. Daron Offer. Patient has never been hospitalized on psychiatric unit, he has no hx of suicidal attempts but admits to on and off SI, he has a long hx of depression and excessive worrying as well as paranoid and grandiose delusions. He expressed having  some bizarre perceptual disturbances e.g. that he suddenly stops hearing someone who he is talking to or the volume of conversation gradually decreases. He also reports brief, infrequent periods that he loses track of time - sounding like absence seizures. He has seen a neurologist for this in the past but "they did not find anything". We continued clonazepam prn anxiety and insomnia. Dc [name] (no benefit) and instead adde Vraylar 3 mg. He appears mildly paranoid - would sometimes see a black truck with black windows outside his home. These ep[isodes used to be more frequent in the place he lived previously. Denies having AH. His sleep is poor and appetite are good. He stays physically  active (runs 1-2 miles). Plan: Continue Zoloft 100 mg, Vraylar 3 mg (we may need to try a higher dose if paranoia intensifies) and Klonopin 1 mg bid unchanged. Add trazodone 100 mg at HS for sleep. He can try 50 or 150 mg as well to see which dose works best. Next appointment in 2 months.  Visit Diagnosis:    ICD-10-CM   1. Schizoaffective disorder, depressive type ([place])  F25.1   2. Transient alteration of awareness  R40.4     Past Psychiatric History: Please see intake H&P.  Past Medical History:  Past Medical History:  Diagnosis Date  . Allergy   . Anxiety   . Asthma   . Chronic pain   . Depression   . Diabetes mellitus   . Neuromuscular disorder [facility])     Past Surgical History:  Procedure Laterality Date  . ANKLE ARTHROSCOPY    . Arm Surgery  1/12   rt ulnar nerve decompression  . EPIDURAL BLOCK INJECTION     several  . ULNAR NERVE TRANSPOSITION  01/19/2012   Procedure: ULNAR NERVE DECOMPRESSION/TRANSPOSITION;  Surgeon: Cammie Sickle., MD;  Location: [facility];  Service: Orthopedics;  Laterality: Left;  ulnar nerve decompression vs transposition left elbow    Family Psychiatric History: Reviewed.  Family History:  Family History  Problem Relation Age of Onset  . Diabetes Father   . Diabetes Paternal Uncle     Social History:  Social History   Socioeconomic History  . Marital status: Single    Spouse name: Not on file  .  Number of children: 0  . Years of education: BA  . Highest education level: Not on file  Occupational History  . Occupation: Unemlpoyed-disabled  Social Needs  . Financial resource strain: Not on file  . Food insecurity    Worry: Not on file    Inability: Not on file  . Transportation needs    Medical: Not on file    Non-medical: Not on file  Tobacco Use  . Smoking status: Never Smoker  . Smokeless tobacco: Never Used  Substance and Sexual Activity  . Alcohol use: No  . Drug use: No  . Sexual activity: Not on  file  Lifestyle  . Physical activity    Days per week: Not on file    Minutes per session: Not on file  . Stress: Not on file  Relationships  . Social Herbalist on phone: Not on file    Gets together: Not on file    Attends religious service: Not on file    Active member of club or organization: Not on file    Attends meetings of clubs or organizations: Not on file    Relationship status: Not on file  Other Topics Concern  . Not on file  Social History Narrative   Lives with mother   Caffeine use: Coke very rare   Right handed     Allergies:  Allergies  Allergen Reactions  . Citric Acid Other (See Comments)    Runny nose, eyes water  . Food     Oranges or anything with citric acid    Metabolic Disorder Labs: Lab Results  Component Value Date   HGBA1C 7.9 (H) 09/24/2018   MPG 123 03/02/2018   No results found for: PROLACTIN Lab Results  Component Value Date   CHOL 118 09/24/2018   TRIG 70.0 09/24/2018   HDL 49.70 09/24/2018   CHOLHDL 2 09/24/2018   VLDL 14.0 09/24/2018   LDLCALC 55 09/24/2018   Lab Results  Component Value Date   TSH 0.92 03/15/2018    Therapeutic Level Labs: No results found for: LITHIUM No results found for: VALPROATE No components found for:  CBMZ  Current Medications: Current Outpatient Medications  Medication Sig Dispense Refill  . ACCU-CHEK SOFTCLIX LANCETS lancets 3 (three) times daily. for testing  0  . albuterol (PROVENTIL HFA;VENTOLIN HFA) 108 (90 Base) MCG/ACT inhaler Inhale 2 puffs into the lungs as needed. 1 Inhaler 2  . ASPIRIN 81 PO Take 1 tablet by mouth daily.    . cariprazine (VRAYLAR) capsule Take 1 capsule (3 mg total) by mouth daily. 30 capsule 1  . clonazePAM (KLONOPIN) 1 MG tablet Take 1 tablet (1 mg total) by mouth 2 (two) times daily as needed for anxiety. TAKE 1 TABLET(1 MG) BY MOUTH TWICE DAILY AS NEEDED 60 tablet 1  . empagliflozin (JARDIANCE) 25 MG TABS tablet Take 25 mg by mouth daily. 30 tablet  6  . fluticasone (FLONASE) 50 MCG/ACT nasal spray Place 2 sprays into both nostrils daily. 16 g 6  . glipiZIDE (GLUCOTROL) 10 MG tablet Take 1 tablet (10 mg total) by mouth 2 (two) times daily. 180 tablet 3  . glucose blood (ACCU-CHEK AVIVA PLUS) test strip Use to test blood sugar up to TID.  Testing more frequently due to symptoms of hypoglycemia.  Diagnosis: E11.9 100 each 6  . LUMIGAN 0.01 % SOLN   0  . Meloxicam (MOBIC PO) Take 1 Dose by mouth as needed. FOR HIP PAIN    .  metFORMIN (GLUCOPHAGE-XR) 500 MG 24 hr tablet Take 4 tablets (2,000 mg total) by mouth daily with breakfast. 360 tablet 1  . montelukast (SINGULAIR) 10 MG tablet Take 1 tablet (10 mg total) by mouth at bedtime. 90 tablet 3  . sertraline (ZOLOFT) 100 MG tablet Take 1 tablet (100 mg total) by mouth daily. 30 tablet 1  . simvastatin (ZOCOR) 20 MG tablet Take 1 tablet (20 mg total) by mouth every evening. 90 tablet 3  . tadalafil (ADCIRCA/CIALIS) 20 MG tablet Take 0.5-1 tablets (10-20 mg total) by mouth every other day as needed for erectile dysfunction. 5 tablet 3  . tamsulosin (FLOMAX) 0.4 MG CAPS capsule TK 1 C PO Q NIGHT     No current facility-administered medications for this visit.      Psychiatric Specialty Exam: Review of Systems  Musculoskeletal: Positive for joint pain.  Psychiatric/Behavioral: The patient is nervous/anxious and has insomnia.   All other systems reviewed and are negative.   There were no vitals taken for this visit.There is no height or weight on file to calculate BMI.  General Appearance: NA  Eye Contact:  NA  Speech:  Clear and Coherent  Volume:  Normal  Mood:  Anxious  Affect:  NA  Thought Process:  Goal Directed  Orientation:  Full (Time, Place, and Person)  Thought Content: Paranoid Ideation   Suicidal Thoughts:  No  Homicidal Thoughts:  No  Memory:  Immediate;   Fair Recent;   Good Remote;   Good  Judgement:  Fair  Insight:  Fair  Psychomotor Activity:  NA  Concentration:   Concentration: Fair  Recall:  [place] of Knowledge: Good  Language: Good  Akathisia:  Negative  Handed:  Right  AIMS (if indicated): not done  Assets:  Communication Skills Desire for Improvement Financial Resources/Insurance Housing Resilience  ADL's:  Intact  Cognition: WNL  Sleep:  Poor   Screenings: PHQ2-9     Nut[facility]/2018 [facility] Visit from 10/28/2016 in [place] [facility] 04/18/2016 in [place] Office Visit from 03/02/2016 in [place]  PHQ-2 Total Score  0  0  0  0  0       Assessment and Plan: 53 yo single male with past dx of schizophrenia paranoid type vs schizoaffective disorder who has been previously followed by Dr. Bernita Raisin. Patient reports that he was told by Dr. Pecola Leisure assistant that he is no longer in practice - this was in the Fall last year. Dr. Zigmund Daniel has refilled patient's p[rescription for psychotropic meds but he needs to have a regular psychiatrist. He is in counseling weekly. Patient has been seen in this office exactly a year ago by Dr. Germaine Pomfret but then followed with Dr. Rosine Door. Not much changed in his history from the original H&P by Dr. Daron Offer. Patient has never been hospitalized on psychiatric unit, he has no hx of suicidal attempts but admits to on and off SI, he has a long hx of depression and excessive worrying as well as paranoid and grandiose delusions. He expressed having  some bizarre perceptual disturbances e.g. that he suddenly stops hearing someone who he is talking to or the volume of conversation gradually decreases. He also reports brief, infrequent periods that he loses track of time - sounding like absence seizures. He has seen a neurologist for this in  the past but "they did not find anything". We continued clonazepam prn anxiety and  insomnia. Dc [name] (no benefit) and instead adde Vraylar 3 mg. He appears mildly paranoid - would sometimes see a black truck with black windows outside his home. These ep[isodes used to be more frequent in the place he lived previously. Denies having AH. His sleep is poor and appetite are good. He stays physically active (runs 1-2 miles).  Plan: Continue Zoloft 100 mg, Vraylar 3 mg (we may need to try a higher dose if paranoia intensifies) and Klonopin 1 mg bid unchanged. Add trazodone 100 mg at HS for sleep. He can try 50 or 150 mg as well to see which dose works best. The plan was discussed with patient who had an opportunity to ask questions and these were all answered. Next appointment in 2 months.    Stephanie Acre, MD 02/20/2019, 10:54 AM

## 2019-02-25 ENCOUNTER — Other Ambulatory Visit (INDEPENDENT_AMBULATORY_CARE_PROVIDER_SITE_OTHER): Payer: Self-pay | Admitting: Physician Assistant

## 2019-02-26 ENCOUNTER — Encounter: Payer: Self-pay | Admitting: Orthopaedic Surgery

## 2019-02-26 NOTE — Telephone Encounter (Signed)
If he has already had ESI, I would refer him to [facility] or [facility]

## 2019-03-04 ENCOUNTER — Encounter: Payer: Self-pay | Admitting: Podiatry

## 2019-03-04 ENCOUNTER — Other Ambulatory Visit: Payer: Self-pay

## 2019-03-04 ENCOUNTER — Ambulatory Visit (INDEPENDENT_AMBULATORY_CARE_PROVIDER_SITE_OTHER): Payer: Medicare Other | Admitting: Podiatry

## 2019-03-04 VITALS — Temp 98.0°F

## 2019-03-04 DIAGNOSIS — R200 Anesthesia of skin: Secondary | ICD-10-CM

## 2019-03-04 DIAGNOSIS — E119 Type 2 diabetes mellitus without complications: Secondary | ICD-10-CM | POA: Diagnosis not present

## 2019-03-04 NOTE — Patient Instructions (Signed)
Diabetes Mellitus and Foot Care Foot care is an important part of your health, especially when you have diabetes. Diabetes may cause you to have problems because of poor blood flow (circulation) to your feet and legs, which can cause your skin to:  Become thinner and drier.  Break more easily.  Heal more slowly.  Peel and crack. You may also have nerve damage (neuropathy) in your legs and feet, causing decreased feeling in them. This means that you may not notice minor injuries to your feet that could lead to more serious problems. Noticing and addressing any potential problems early is the best way to prevent future foot problems. How to care for your feet Foot hygiene  Wash your feet daily with warm water and mild soap. Do not use hot water. Then, pat your feet and the areas between your toes until they are completely dry. Do not soak your feet as this can dry your skin.  Trim your toenails straight across. Do not dig under them or around the cuticle. File the edges of your nails with an emery board or nail file.  Apply a moisturizing lotion or petroleum jelly to the skin on your feet and to dry, brittle toenails. Use lotion that does not contain alcohol and is unscented. Do not apply lotion between your toes. Shoes and socks  Wear clean socks or stockings every day. Make sure they are not too tight. Do not wear knee-high stockings since they may decrease blood flow to your legs.  Wear shoes that fit properly and have enough cushioning. Always look in your shoes before you put them on to be sure there are no objects inside.  To break in new shoes, wear them for just a few hours a day. This prevents injuries on your feet. Wounds, scrapes, corns, and calluses  Check your feet daily for blisters, cuts, bruises, sores, and redness. If you cannot see the bottom of your feet, use a mirror or ask someone for help.  Do not cut corns or calluses or try to remove them with medicine.  If you  find a minor scrape, cut, or break in the skin on your feet, keep it and the skin around it clean and dry. You may clean these areas with mild soap and water. Do not clean the area with peroxide, alcohol, or iodine.  If you have a wound, scrape, corn, or callus on your foot, look at it several times a day to make sure it is healing and not infected. Check for: ? Redness, swelling, or pain. ? Fluid or blood. ? Warmth. ? Pus or a bad smell. General instructions  Do not cross your legs. This may decrease blood flow to your feet.  Do not use heating pads or hot water bottles on your feet. They may burn your skin. If you have lost feeling in your feet or legs, you may not know this is happening until it is too late.  Protect your feet from hot and cold by wearing shoes, such as at the beach or on hot pavement.  Schedule a complete foot exam at least once a year (annually) or more often if you have foot problems. If you have foot problems, report any cuts, sores, or bruises to your health care provider immediately. Contact a health care provider if:  You have a medical condition that increases your risk of infection and you have any cuts, sores, or bruises on your feet.  You have an injury that is not   healing.  You have redness on your legs or feet.  You feel burning or tingling in your legs or feet.  You have pain or cramps in your legs and feet.  Your legs or feet are numb.  Your feet always feel cold.  You have pain around a toenail. Get help right away if:  You have a wound, scrape, corn, or callus on your foot and: ? You have pain, swelling, or redness that gets worse. ? You have fluid or blood coming from the wound, scrape, corn, or callus. ? Your wound, scrape, corn, or callus feels warm to the touch. ? You have pus or a bad smell coming from the wound, scrape, corn, or callus. ? You have a fever. ? You have a red line going up your leg. Summary  Check your feet every day  for cuts, sores, red spots, swelling, and blisters.  Moisturize feet and legs daily.  Wear shoes that fit properly and have enough cushioning.  If you have foot problems, report any cuts, sores, or bruises to your health care provider immediately.  Schedule a complete foot exam at least once a year (annually) or more often if you have foot problems. This information is not intended to replace advice given to you by your health care provider. Make sure you discuss any questions you have with your health care provider. Document Released: 08/12/2000 Document Revised: 09/27/2017 Document Reviewed: 09/16/2016 Elsevier Patient Education  2020 Elsevier Inc.  

## 2019-03-04 NOTE — Progress Notes (Signed)
Subjective: Glenn Robertson presents today for follow up diabetic foot care. He was seen last year for plantar fasciitis and that has resolved. He has tried orthotics and he felt they were making his foot pain worse, so he exercises without them.   Patient also notes sciatica which affects his left side. Lately he feels some numbness around the outside of his ankle extending distally and dorsally across the last 3 toes.  He relates toenails b/l 5th digits have chunks of nail that peel off on occasion. He has recently trimmed and removed them.   Patient also relates receiving right hip trigger point in 2018. Also admits bulging disc of L5-S1.  Luetta Nutting, DO is his PCP and last visit was 10/08/2018.   Current Outpatient Medications:  .  ACCU-CHEK SOFTCLIX LANCETS lancets, 3 (three) times daily. for testing, Disp: , Rfl: 0 .  albuterol (PROVENTIL HFA;VENTOLIN HFA) 108 (90 Base) MCG/ACT inhaler, Inhale 2 puffs into the lungs as needed., Disp: 1 Inhaler, Rfl: 2 .  ASPIRIN 81 PO, Take 1 tablet by mouth daily., Disp: , Rfl:  .  cariprazine (VRAYLAR) capsule, Take 1 capsule (3 mg total) by mouth daily., Disp: 30 capsule, Rfl: 1 .  clonazePAM (KLONOPIN) 1 MG tablet, Take 1 tablet (1 mg total) by mouth 2 (two) times daily as needed for anxiety. TAKE 1 TABLET(1 MG) BY MOUTH TWICE DAILY AS NEEDED, Disp: 60 tablet, Rfl: 1 .  empagliflozin (JARDIANCE) 25 MG TABS tablet, Take 25 mg by mouth daily., Disp: 30 tablet, Rfl: 6 .  fluticasone (FLONASE) 50 MCG/ACT nasal spray, Place 2 sprays into both nostrils daily., Disp: 16 g, Rfl: 6 .  glipiZIDE (GLUCOTROL) 10 MG tablet, Take 1 tablet (10 mg total) by mouth 2 (two) times daily., Disp: 180 tablet, Rfl: 3 .  glucose blood (ACCU-CHEK AVIVA PLUS) test strip, Use to test blood sugar up to TID.  Testing more frequently due to symptoms of hypoglycemia.  Diagnosis: E11.9, Disp: 100 each, Rfl: 6 .  LUMIGAN 0.01 % SOLN, , Disp: , Rfl: 0 .  Meloxicam (MOBIC PO), Take 1  Dose by mouth as needed. FOR HIP PAIN, Disp: , Rfl:  .  metFORMIN (GLUCOPHAGE-XR) 500 MG 24 hr tablet, Take 4 tablets (2,000 mg total) by mouth daily with breakfast., Disp: 360 tablet, Rfl: 1 .  montelukast (SINGULAIR) 10 MG tablet, Take 1 tablet (10 mg total) by mouth at bedtime., Disp: 90 tablet, Rfl: 3 .  sertraline (ZOLOFT) 100 MG tablet, Take 1 tablet (100 mg total) by mouth daily., Disp: 30 tablet, Rfl: 1 .  simvastatin (ZOCOR) 20 MG tablet, Take 1 tablet (20 mg total) by mouth every evening., Disp: 90 tablet, Rfl: 3 .  tadalafil (ADCIRCA/CIALIS) 20 MG tablet, Take 0.5-1 tablets (10-20 mg total) by mouth every other day as needed for erectile dysfunction., Disp: 5 tablet, Rfl: 3 .  tamsulosin (FLOMAX) 0.4 MG CAPS capsule, TK 1 C PO Q NIGHT, Disp: , Rfl:  .  traZODone (DESYREL) 100 MG tablet, Take 1 tablet (100 mg total) by mouth at bedtime as needed for sleep., Disp: 30 tablet, Rfl: 1  Allergies  Allergen Reactions  . Citric Acid Other (See Comments)    Runny nose, eyes water  . Food     Oranges or anything with citric acid    Objective: Vitals:   03/04/19 0831  Temp: 98 F (36.7 C)    Vascular Examination: Capillary refill time immediate x 10 digits.  Dorsalis pedis and Posterior tibial pulses  palpable b/l.  Digital hair present x 10 digits.  Skin temperature gradient WNL b/l.  Dermatological Examination: Skin with normal turgor, texture and tone b/l.  Toenails 1-5 b/l adequate length. Bilateral 5th digits show evidence of recent trimming as well.  Musculoskeletal: Muscle strength 5/5 to all LE muscle groups  No gross bony deformities b/l.  No pain, crepitus or joint limitation noted with ROM.   Neurological: Sensation intact 5/5 b/l  with 10 gram monofilament.  Vibratory sensation intact b/l.  Assessment: NIDDM  Plan: 1. Diabetic foot examination performed today. 2. I have asked him to allow his nails to grow out so I can evaluate his 5th digits on our  next visit in 3 months. 3. We discussed his symptoms in his feet could definitely be related to his bulging discs and sciatica. He states he will follow up with Ortho regarding symptoms. 4. Patient to continue soft, supportive shoe gear daily. 5. Patient to report any pedal injuries to medical professional immediately. 6. Follow up 3 months.  7. Patient/POA to call should there be a concern in the interim.

## 2019-03-12 ENCOUNTER — Ambulatory Visit (INDEPENDENT_AMBULATORY_CARE_PROVIDER_SITE_OTHER): Payer: Medicare Other | Admitting: Orthopaedic Surgery

## 2019-03-12 ENCOUNTER — Ambulatory Visit (INDEPENDENT_AMBULATORY_CARE_PROVIDER_SITE_OTHER): Payer: Medicare Other

## 2019-03-12 ENCOUNTER — Other Ambulatory Visit: Payer: Self-pay

## 2019-03-12 ENCOUNTER — Encounter: Payer: Self-pay | Admitting: Orthopaedic Surgery

## 2019-03-12 VITALS — Ht 70.0 in | Wt 193.0 lb

## 2019-03-12 DIAGNOSIS — M545 Low back pain, unspecified: Secondary | ICD-10-CM

## 2019-03-12 NOTE — Progress Notes (Signed)
Office Visit Note   Patient: Glenn Robertson           Date of Birth: 09-29-1965           MRN: [id number] Visit Date: 03/12/2019              Requested by: Luetta Nutting, DO [place],  [place] [id number] PCP: Luetta Nutting, DO   Assessment & Plan: Visit Diagnoses:  1. Low back pain, unspecified back pain laterality, unspecified chronicity, unspecified whether sciatica present     Plan: patient recommended to continue his normal morning stretching exercises and continue activity as tolerated.  He can return if he develops symptoms of radiculopathy.  Follow-Up Instructions: No follow-ups on file.   Orders:  Orders Placed This Encounter  Procedures  . XR Lumbar Spine 2-3 Views   No orders of the defined types were placed in this encounter.     Procedures: No procedures performed   Clinical Data: No additional findings.   Subjective: Chief Complaint  Patient presents with  . Lower Back - Pain  . Right Hip - Pain    HPI 53 year old male returns with ongoing complaints of low back pain.  MRI 11/25/2017 showed no areas of significant compression.  He is gotten some new shoes and [place] states he started having some lower back pain that radiated into his left leg.  He is currently learning [place] and [place].  He has a book with him today for [place] language.  He states the pain is gotten somewhat better over the last weekend.  He has problems when he does a lot of walking due to schizophrenia and does better if he is moving quicker.  When he rides a bike he states he has some lumbosacral and lumbar pain.  He denies fever chills no bowel bladder symptoms.  Review of Systems review of systems updated noncontributory to HPI.   Objective: Vital Signs: Ht 5\' 10"  (1.778 m)   Wt 193 lb (87.5 kg)   BMI 27.69 kg/m   Physical Exam Constitutional:      Appearance: He is well-developed.  HENT:     Head: Normocephalic and atraumatic.   Eyes:     Pupils: Pupils are equal, round, and reactive to light.  Neck:     Thyroid: No thyromegaly.     Trachea: No tracheal deviation.  Cardiovascular:     Rate and Rhythm: Normal rate.  Pulmonary:     Effort: Pulmonary effort is normal.     Breath sounds: No wheezing.  Abdominal:     General: Bowel sounds are normal.     Palpations: Abdomen is soft.  Skin:    General: Skin is warm and dry.     Capillary Refill: Capillary refill takes less than 2 seconds.  Neurological:     Mental Status: He is alert and oriented to person, place, and time.  Psychiatric:        Behavior: Behavior normal.        Thought Content: Thought content normal.        Judgment: Judgment normal.     Ortho Exam negative logroll of the hips negative straight leg raising 90 degrees knee and ankle jerk are intact he is able heel and toe walk he gets rapidly from sitting to standing can rotate bend without difficulty.  Specialty Comments:  No specialty comments available.  Imaging: No results found.   PMFS History: Patient Active Problem List  Diagnosis Date Noted  . Schizoaffective disorder, depressive type ([place]) 11/27/2018  . Urinary hesitancy 10/08/2018  . Erectile dysfunction 09/24/2018  . Transient alteration of awareness 08/08/2018  . Tendinopathy of right gluteus medius 05/22/2018  . Pain of right hip joint 05/08/2018  . Hearing difficulty 04/13/2018  . Trochanteric bursitis, right hip 03/23/2018  . Asthma 03/16/2018  . Headache 03/02/2018  . Type 2 diabetes mellitus without complication, without long-term current use of insulin ([place]) 02/16/2018  . Screening for colon cancer 02/16/2018  . Pain in left leg 10/28/2016  . Lumbosacral radiculopathy at S1 03/15/2016  . Lumbar degenerative disc disease 12/18/2015  . Disc displacement, lumbar 04/21/2015  . Chondromalacia of both patellae 02/24/2015  . Paranoid schizophrenia, chronic condition ([place]) 02/24/2015  . Right knee pain 07/18/2014   . Right ankle pain 01/27/2014  . Pain in joint, ankle and foot 01/27/2014  . Sciatica 01/10/2012   Past Medical History:  Diagnosis Date  . Allergy   . Anxiety   . Asthma   . Chronic pain   . Depression   . Diabetes mellitus   . Neuromuscular disorder ([place])     Family History  Problem Relation Age of Onset  . Diabetes Father   . Diabetes Paternal Uncle     Past Surgical History:  Procedure Laterality Date  . ANKLE ARTHROSCOPY    . Arm Surgery  1/12   rt ulnar nerve decompression  . EPIDURAL BLOCK INJECTION     several  . ULNAR NERVE TRANSPOSITION  01/19/2012   Procedure: ULNAR NERVE DECOMPRESSION/TRANSPOSITION;  Surgeon: Cammie Sickle., MD;  Location: [facility];  Service: Orthopedics;  Laterality: Left;  ulnar nerve decompression vs transposition left elbow   Social History   Occupational History  . Occupation: Unemlpoyed-disabled  Tobacco Use  . Smoking status: Never Smoker  . Smokeless tobacco: Never Used  Substance and Sexual Activity  . Alcohol use: No  . Drug use: No  . Sexual activity: Not on file

## 2019-03-14 ENCOUNTER — Ambulatory Visit (INDEPENDENT_AMBULATORY_CARE_PROVIDER_SITE_OTHER): Payer: Medicare Other | Admitting: Psychology

## 2019-03-14 DIAGNOSIS — F331 Major depressive disorder, recurrent, moderate: Secondary | ICD-10-CM | POA: Diagnosis not present

## 2019-03-14 DIAGNOSIS — F4310 Post-traumatic stress disorder, unspecified: Secondary | ICD-10-CM

## 2019-03-14 DIAGNOSIS — F200 Paranoid schizophrenia: Secondary | ICD-10-CM

## 2019-03-14 DIAGNOSIS — F411 Generalized anxiety disorder: Secondary | ICD-10-CM | POA: Diagnosis not present

## 2019-03-17 ENCOUNTER — Encounter: Payer: Self-pay | Admitting: Orthopaedic Surgery

## 2019-03-19 ENCOUNTER — Other Ambulatory Visit: Payer: Self-pay | Admitting: Family Medicine

## 2019-03-19 ENCOUNTER — Telehealth (HOSPITAL_COMMUNITY): Payer: Self-pay

## 2019-03-19 ENCOUNTER — Encounter: Payer: Self-pay | Admitting: Family Medicine

## 2019-03-19 NOTE — Telephone Encounter (Signed)
Patient is calling to report that he is under a lot of stress and having some paranoia and wants to increase his Vraylar. Please review and advise, thank you

## 2019-03-20 ENCOUNTER — Other Ambulatory Visit (HOSPITAL_COMMUNITY): Payer: Self-pay | Admitting: Psychiatry

## 2019-03-20 ENCOUNTER — Other Ambulatory Visit: Payer: Self-pay | Admitting: Family Medicine

## 2019-03-20 MED ORDER — CARIPRAZINE HCL 4.5 MG PO CAPS: 4.5000 mg | ORAL_CAPSULE | Freq: Every day | ORAL | 1 refills | Status: DC

## 2019-03-20 NOTE — Telephone Encounter (Signed)
New Rx for 4.5 mg (was on 3 mg so far) send to pharmacy.

## 2019-03-21 ENCOUNTER — Other Ambulatory Visit (INDEPENDENT_AMBULATORY_CARE_PROVIDER_SITE_OTHER): Payer: Medicare Other

## 2019-03-21 DIAGNOSIS — E119 Type 2 diabetes mellitus without complications: Secondary | ICD-10-CM

## 2019-03-21 LAB — BASIC METABOLIC PANEL
BUN: 14 mg/dL (ref 6–23)
CO2: 26 mEq/L (ref 19–32)
Calcium: 9.8 mg/dL (ref 8.4–10.5)
Chloride: 101 mEq/L (ref 96–112)
Creatinine, Ser: 1.08 mg/dL (ref 0.40–1.50)
GFR: 86.69 mL/min (ref >60.00)
Glucose, Bld: 187 mg/dL — ABNORMAL HIGH (ref 70–99)
Potassium: 3.7 mEq/L (ref 3.5–5.1)
Sodium: 136 mEq/L (ref 135–145)

## 2019-03-21 LAB — HEMOGLOBIN A1C: Hgb A1c MFr Bld: 7.8 % — ABNORMAL HIGH (ref 4.6–6.5)

## 2019-03-21 NOTE — Telephone Encounter (Signed)
I called patient and let him know

## 2019-03-26 ENCOUNTER — Other Ambulatory Visit: Payer: Self-pay | Admitting: Family Medicine

## 2019-03-28 ENCOUNTER — Ambulatory Visit (INDEPENDENT_AMBULATORY_CARE_PROVIDER_SITE_OTHER): Payer: Medicare Other | Admitting: Psychology

## 2019-03-28 DIAGNOSIS — F200 Paranoid schizophrenia: Secondary | ICD-10-CM

## 2019-03-28 DIAGNOSIS — F4310 Post-traumatic stress disorder, unspecified: Secondary | ICD-10-CM | POA: Diagnosis not present

## 2019-03-28 DIAGNOSIS — F411 Generalized anxiety disorder: Secondary | ICD-10-CM | POA: Diagnosis not present

## 2019-03-28 DIAGNOSIS — F331 Major depressive disorder, recurrent, moderate: Secondary | ICD-10-CM | POA: Diagnosis not present

## 2019-04-10 ENCOUNTER — Ambulatory Visit (INDEPENDENT_AMBULATORY_CARE_PROVIDER_SITE_OTHER): Payer: Medicare Other | Admitting: Psychology

## 2019-04-10 DIAGNOSIS — F4310 Post-traumatic stress disorder, unspecified: Secondary | ICD-10-CM

## 2019-04-10 DIAGNOSIS — F331 Major depressive disorder, recurrent, moderate: Secondary | ICD-10-CM | POA: Diagnosis not present

## 2019-04-10 DIAGNOSIS — F411 Generalized anxiety disorder: Secondary | ICD-10-CM | POA: Diagnosis not present

## 2019-04-18 ENCOUNTER — Other Ambulatory Visit (HOSPITAL_COMMUNITY): Payer: Self-pay

## 2019-04-18 MED ORDER — SERTRALINE HCL 100 MG PO TABS: 100.0000 mg | ORAL_TABLET | Freq: Every day | ORAL | 0 refills | Status: DC

## 2019-04-22 ENCOUNTER — Other Ambulatory Visit: Payer: Self-pay

## 2019-04-22 ENCOUNTER — Ambulatory Visit (INDEPENDENT_AMBULATORY_CARE_PROVIDER_SITE_OTHER): Payer: Medicare Other | Admitting: Psychiatry

## 2019-04-22 DIAGNOSIS — F251 Schizoaffective disorder, depressive type: Secondary | ICD-10-CM

## 2019-04-22 DIAGNOSIS — R404 Transient alteration of awareness: Secondary | ICD-10-CM

## 2019-04-22 MED ORDER — CLONAZEPAM 1 MG PO TABS: 1.0000 mg | ORAL_TABLET | Freq: Two times a day (BID) | ORAL | 2 refills | Status: DC | PRN

## 2019-04-22 MED ORDER — TRAZODONE HCL 100 MG PO TABS: 100.0000 mg | ORAL_TABLET | Freq: Every evening | ORAL | 1 refills | Status: DC | PRN

## 2019-04-22 MED ORDER — CARIPRAZINE HCL 6 MG PO CAPS: 6.0000 mg | ORAL_CAPSULE | Freq: Every day | ORAL | 2 refills | Status: DC

## 2019-04-22 NOTE — Progress Notes (Signed)
[facility] MD/PA/NP OP Progress Note  04/22/2019 9:18 AM Glenn Robertson  MRN:  [id number] Interview was conducted by phone and I verified that I was speaking with the correct person using two identifiers. I discussed the limitations of evaluation and management by telemedicine and  the availability of in person appointments. Patient expressed understanding and agreed to proceed.  Chief Complaint: Paranoia, middle insomnia  HPI: 53 yo single AAM with past dx of schizophrenia paranoid type vsschizoaffective disorderwho has been previously followed by Dr. Bernita Raisin. Patient has been seen in this office exactly a year ago by Dr. Germaine Pomfret but then followed with Dr. Rosine Door. Not much changed in his history from the original H&P by Dr. Daron Offer. Patient has no hx of suicidal attempts but admits to on and off SI, he has a long hx of depression and excessive worrying as well as paranoid and grandiose delusions. He expressed havingsome bizarre perceptual disturbances e.g. that he suddenly stops hearing someone who he is talking to or the volume of conversation gradually decreases. He also reports brief, infrequent periods that he loses track of time - sounding like absence seizures. He has seen a neurologist for this in the past but "they did not find anything". We continuedclonazepam prn anxiety and insomnia. Dc [name] (no benefit) and instead added Vraylar 3 mg which was subsequently increased to 4.5 mg because of ongoing paranoia. He still sounds mildly paranoid - continues to worry about a black truck with black windows outside his home. Denies having AH. His sleep is fair but interrupted. Appetite is good.   Visit Diagnosis:    ICD-10-CM   1. Schizoaffective disorder, depressive type ([place])  F25.1 clonazePAM (KLONOPIN) 1 MG tablet  2. Transient alteration of awareness  R40.4 clonazePAM (KLONOPIN) 1 MG tablet    Past Psychiatric History: Please see intake H&P.  Past Medical History:  Past Medical History:   Diagnosis Date  . Allergy   . Anxiety   . Asthma   . Chronic pain   . Depression   . Diabetes mellitus   . Neuromuscular disorder [facility])     Past Surgical History:  Procedure Laterality Date  . ANKLE ARTHROSCOPY    . Arm Surgery  1/12   rt ulnar nerve decompression  . EPIDURAL BLOCK INJECTION     several  . ULNAR NERVE TRANSPOSITION  01/19/2012   Procedure: ULNAR NERVE DECOMPRESSION/TRANSPOSITION;  Surgeon: Cammie Sickle., MD;  Location: [facility];  Service: Orthopedics;  Laterality: Left;  ulnar nerve decompression vs transposition left elbow    Family Psychiatric History: None  Family History:  Family History  Problem Relation Age of Onset  . Diabetes Father   . Diabetes Paternal Uncle     Social History:  Social History   Socioeconomic History  . Marital status: Single    Spouse name: Not on file  . Number of children: 0  . Years of education: BA  . Highest education level: Not on file  Occupational History  . Occupation: Unemlpoyed-disabled  Social Needs  . Financial resource strain: Not on file  . Food insecurity    Worry: Not on file    Inability: Not on file  . Transportation needs    Medical: Not on file    Non-medical: Not on file  Tobacco Use  . Smoking status: Never Smoker  . Smokeless tobacco: Never Used  Substance and Sexual Activity  . Alcohol use: No  . Drug use: No  . Sexual activity:  Not on file  Lifestyle  . Physical activity    Days per week: Not on file    Minutes per session: Not on file  . Stress: Not on file  Relationships  . Social Herbalist on phone: Not on file    Gets together: Not on file    Attends religious service: Not on file    Active member of club or organization: Not on file    Attends meetings of clubs or organizations: Not on file    Relationship status: Not on file  Other Topics Concern  . Not on file  Social History Narrative   Lives with mother   Caffeine use: Coke very  rare   Right handed     Allergies:  Allergies  Allergen Reactions  . Citric Acid Other (See Comments)    Runny nose, eyes water  . Food     Oranges or anything with citric acid    Metabolic Disorder Labs: Lab Results  Component Value Date   HGBA1C 7.8 (H) 03/21/2019   MPG 123 03/02/2018   No results found for: PROLACTIN Lab Results  Component Value Date   CHOL 118 09/24/2018   TRIG 70.0 09/24/2018   HDL 49.70 09/24/2018   CHOLHDL 2 09/24/2018   VLDL 14.0 09/24/2018   LDLCALC 55 09/24/2018   Lab Results  Component Value Date   TSH 0.92 03/15/2018    Therapeutic Level Labs: No results found for: LITHIUM No results found for: VALPROATE No components found for:  CBMZ  Current Medications: Current Outpatient Medications  Medication Sig Dispense Refill  . ACCU-CHEK AVIVA PLUS test strip TEST THREE TIMES DAILY 100 strip 2  . ACCU-CHEK SOFTCLIX LANCETS lancets 3 (three) times daily. for testing  0  . albuterol (PROVENTIL HFA;VENTOLIN HFA) 108 (90 Base) MCG/ACT inhaler Inhale 2 puffs into the lungs as needed. 1 Inhaler 2  . ASPIRIN 81 PO Take 1 tablet by mouth daily.    . Cariprazine HCl 6 MG CAPS Take 1 capsule (6 mg total) by mouth daily. 30 capsule 2  . clonazePAM (KLONOPIN) 1 MG tablet Take 1 tablet (1 mg total) by mouth 2 (two) times daily as needed for anxiety. TAKE 1 TABLET(1 MG) BY MOUTH TWICE DAILY AS NEEDED 60 tablet 2  . empagliflozin (JARDIANCE) 25 MG TABS tablet Take 25 mg by mouth daily. 30 tablet 6  . fluticasone (FLONASE) 50 MCG/ACT nasal spray Place 2 sprays into both nostrils daily. 16 g 6  . glipiZIDE (GLUCOTROL) 10 MG tablet TAKE 1 TABLET(10 MG) BY MOUTH TWICE DAILY 180 tablet 3  . LUMIGAN 0.01 % SOLN   0  . Meloxicam (MOBIC PO) Take 1 Dose by mouth as needed. FOR HIP PAIN    . metFORMIN (GLUCOPHAGE-XR) 500 MG 24 hr tablet Take 4 tablets (2,000 mg total) by mouth daily with breakfast. 360 tablet 1  . montelukast (SINGULAIR) 10 MG tablet Take 1 tablet  (10 mg total) by mouth at bedtime. 90 tablet 3  . sertraline (ZOLOFT) 100 MG tablet Take 1 tablet (100 mg total) by mouth daily. 30 tablet 0  . simvastatin (ZOCOR) 20 MG tablet Take 1 tablet (20 mg total) by mouth every evening. 90 tablet 3  . tadalafil (CIALIS) 20 MG tablet TAKE ONE-HALF TO ONE TABLET BY MOUTH EVERY OTHER DAY AS NEEDED FOR ERECTILE DYSFUNCTION 5 tablet 3  . tamsulosin (FLOMAX) 0.4 MG CAPS capsule TK 1 C PO Q NIGHT    . traZODone (  DESYREL) 100 MG tablet Take 1 tablet (100 mg total) by mouth at bedtime as needed for sleep. 30 tablet 1   No current facility-administered medications for this visit.      Psychiatric Specialty Exam: Review of Systems  Psychiatric/Behavioral: The patient is nervous/anxious and has insomnia.   All other systems reviewed and are negative.   There were no vitals taken for this visit.There is no height or weight on file to calculate BMI.  General Appearance: NA  Eye Contact:  NA  Speech:  Clear and Coherent and Normal Rate  Volume:  Normal  Mood:  Anxious  Affect:  NA  Thought Process:  Goal Directed  Orientation:  Full (Time, Place, and Person)  Thought Content: Paranoid Ideation and Rumination   Suicidal Thoughts:  No  Homicidal Thoughts:  No  Memory:  Immediate;   Good Recent;   Good Remote;   Good  Judgement:  Fair  Insight:  Fair  Psychomotor Activity:  NA  Concentration:  Concentration: Good  Recall:  Good  Fund of Knowledge: Good  Language: Good  Akathisia:  Possibly episodic.  Handed:  Right  AIMS (if indicated): not done  Assets:  Communication Skills Desire for Improvement Financial Resources/Insurance Housing Physical Health Resilience  ADL's:  Intact  Cognition: WNL  Sleep:  Fair   Screenings: PHQ2-9     Nutrition [facility]/09/2018 [facility] Visit from 10/28/2016 in [facility]  from 04/18/2016 in [facility] Office Visit from 03/02/2016 in [facility]  PHQ-2 Total Score  0  0  0  0  0       Assessment and Plan: 53 yo single AAM with past dx of schizophrenia paranoid type vsschizoaffective disorderwho has been previously followed by Dr. Bernita Raisin. Patient has been seen in this office exactly a year ago by Dr. Germaine Pomfret but then followed with Dr. Rosine Door. Not much changed in his history from the original H&P by Dr. Daron Offer. Patient has no hx of suicidal attempts but admits to on and off SI, he has a long hx of depression and excessive worrying as well as paranoid and grandiose delusions. He expressed havingsome bizarre perceptual disturbances e.g. that he suddenly stops hearing someone who he is talking to or the volume of conversation gradually decreases. He also reports brief, infrequent periods that he loses track of time - sounding like absence seizures. He has seen a neurologist for this in the past but "they did not find anything". We continuedclonazepam prn anxiety and insomnia. Dc [name] (no benefit) and instead added Vraylar 3 mg which was subsequently increased to 4.5 mg because of ongoing paranoia. He still sounds mildly paranoid - continues to worry about a black truck with black windows outside his home. Denies having AH. His sleep is fair but interrupted. Appetite is good.               Plan: Continue Zoloft 100 mg, increase Vraylar to 6 mg, continue Klonopin 1 mg bid and trazodone 100 mg at HS for sleep. The plan was discussed with patient who had an opportunity to ask questions and these were all answered. Next appointment in 2 months. I spend 25 min in phone consultation with the patient.    Stephanie Acre, MD 04/22/2019, 9:18 AM

## 2019-04-25 DIAGNOSIS — Z23 Encounter for immunization: Secondary | ICD-10-CM | POA: Diagnosis not present

## 2019-05-03 ENCOUNTER — Ambulatory Visit (INDEPENDENT_AMBULATORY_CARE_PROVIDER_SITE_OTHER): Payer: Medicare Other | Admitting: Psychology

## 2019-05-03 DIAGNOSIS — F410 Panic disorder [episodic paroxysmal anxiety] without agoraphobia: Secondary | ICD-10-CM | POA: Diagnosis not present

## 2019-05-03 DIAGNOSIS — F331 Major depressive disorder, recurrent, moderate: Secondary | ICD-10-CM | POA: Diagnosis not present

## 2019-05-03 DIAGNOSIS — F4310 Post-traumatic stress disorder, unspecified: Secondary | ICD-10-CM | POA: Diagnosis not present

## 2019-05-03 DIAGNOSIS — F200 Paranoid schizophrenia: Secondary | ICD-10-CM

## 2019-05-06 ENCOUNTER — Other Ambulatory Visit: Payer: Self-pay | Admitting: Family Medicine

## 2019-05-16 ENCOUNTER — Encounter: Payer: Self-pay | Admitting: Sports Medicine

## 2019-05-16 ENCOUNTER — Other Ambulatory Visit: Payer: Self-pay

## 2019-05-16 ENCOUNTER — Ambulatory Visit (INDEPENDENT_AMBULATORY_CARE_PROVIDER_SITE_OTHER): Payer: Medicare Other | Admitting: Sports Medicine

## 2019-05-16 VITALS — BP 110/68 | Ht 70.0 in | Wt 195.0 lb

## 2019-05-16 DIAGNOSIS — S76312A Strain of muscle, fascia and tendon of the posterior muscle group at thigh level, left thigh, initial encounter: Secondary | ICD-10-CM

## 2019-05-16 NOTE — Progress Notes (Signed)
   Subjective:    Patient ID: Glenn Robertson, male    DOB: 07/16/1966, 53 y.o.   MRN: [id number]  HPI chief complaint: Left hamstring pain  Glenn Robertson comes in today complaining of 1 week of left hamstring pain.  He began to experience pain while running.  Today he is pain-free.  He has had intermittent similar episodes in the past.  He has tried doing some hamstring exercises.  He describes the pain as a tightening sensation or a "locking up".  He localizes his pain to the midportion of the hamstring.  He denies swelling.  No numbness or tingling.  No weakness.  Interim medical history reviewed Medications reviewed Allergies reviewed   Review of Systems    As above Objective:   Physical Exam  Well-developed, well-nourished.  No acute distress.  Awake alert and oriented x3.  Vital signs reviewed  Left hamstring: Patient has good strength with both 90 degrees and 30 degrees of knee flexion.  There is no palpable defect.  No ecchymosis.  No soft tissue swelling.  No tenderness to palpation.  Neurovascular intact distally.      Assessment & Plan:   Left leg pain likely secondary to mild hamstring strain  Patient is instructed in [place] home exercises.  I think he will be able to continue with activity as tolerated including running but I have recommended that he wear a compression sleeve.  Follow-up for ongoing or recalcitrant issues.

## 2019-05-22 ENCOUNTER — Telehealth: Payer: Self-pay | Admitting: Family Medicine

## 2019-05-22 ENCOUNTER — Other Ambulatory Visit: Payer: Self-pay | Admitting: Family Medicine

## 2019-05-22 NOTE — Telephone Encounter (Signed)
Copied from [place] [id number]. Topic: General - Other >> May 22, 2019 10:36 AM Keene Breath wrote: Reason for CRM: Patient called to ask the doctor to increase his cialis dosage.  Patient would like a call from the nurse.  Please advise and call patient back at [phone]

## 2019-05-22 NOTE — Telephone Encounter (Signed)
Spoke with Glenn Robertson Glenn Robertson would like to know if the quantity of the cialis could be increased instead of only getting 5 at a time if he could get like 10 or 15 with refills.Glenn Robertson also was concerned about him having cold feet inside the house when the rest of him is warm like his hands he is concerned he may have some circulation problem or if that is normal please advise.

## 2019-05-23 ENCOUNTER — Other Ambulatory Visit: Payer: Self-pay | Admitting: Family Medicine

## 2019-05-23 MED ORDER — TADALAFIL 20 MG PO TABS: ORAL_TABLET | ORAL | 3 refills | Status: DC

## 2019-05-23 NOTE — Telephone Encounter (Signed)
Sent mychart message

## 2019-05-23 NOTE — Telephone Encounter (Signed)
Increased to #10/month. We can take a look at hands and feet at next routine appt.

## 2019-05-27 ENCOUNTER — Other Ambulatory Visit (HOSPITAL_COMMUNITY): Payer: Self-pay

## 2019-05-27 MED ORDER — SERTRALINE HCL 100 MG PO TABS: 100.0000 mg | ORAL_TABLET | Freq: Every day | ORAL | 0 refills | Status: DC

## 2019-05-28 ENCOUNTER — Ambulatory Visit (INDEPENDENT_AMBULATORY_CARE_PROVIDER_SITE_OTHER): Payer: Medicare Other | Admitting: Psychology

## 2019-05-28 ENCOUNTER — Telehealth: Payer: Self-pay | Admitting: Orthopaedic Surgery

## 2019-05-28 DIAGNOSIS — F4310 Post-traumatic stress disorder, unspecified: Secondary | ICD-10-CM

## 2019-05-28 DIAGNOSIS — F331 Major depressive disorder, recurrent, moderate: Secondary | ICD-10-CM

## 2019-05-28 DIAGNOSIS — F200 Paranoid schizophrenia: Secondary | ICD-10-CM | POA: Diagnosis not present

## 2019-05-28 DIAGNOSIS — F411 Generalized anxiety disorder: Secondary | ICD-10-CM

## 2019-05-28 NOTE — Telephone Encounter (Signed)
Pt called in said he discussed pt with dr.yates and now would like to go through with it. Pt is requesting a order to pt anywhere that is not [facility] related.   [phone]

## 2019-05-29 NOTE — Telephone Encounter (Signed)
Pt called in again about an order to physical therapy.   [phone]

## 2019-05-29 NOTE — Telephone Encounter (Signed)
After speaking with patient, I scheduled him an appointment with Jeneen Rinks on 10/07. He is having additional issues

## 2019-06-04 ENCOUNTER — Ambulatory Visit: Payer: Medicare Other | Admitting: Podiatry

## 2019-06-05 ENCOUNTER — Ambulatory Visit (INDEPENDENT_AMBULATORY_CARE_PROVIDER_SITE_OTHER): Payer: Medicare Other | Admitting: Surgery

## 2019-06-05 ENCOUNTER — Encounter: Payer: Self-pay | Admitting: Surgery

## 2019-06-05 VITALS — Ht 70.0 in | Wt 193.0 lb

## 2019-06-05 DIAGNOSIS — M5136 Other intervertebral disc degeneration, lumbar region: Secondary | ICD-10-CM

## 2019-06-05 DIAGNOSIS — M79605 Pain in left leg: Secondary | ICD-10-CM | POA: Diagnosis not present

## 2019-06-05 DIAGNOSIS — M5126 Other intervertebral disc displacement, lumbar region: Secondary | ICD-10-CM

## 2019-06-05 DIAGNOSIS — M5417 Radiculopathy, lumbosacral region: Secondary | ICD-10-CM | POA: Diagnosis not present

## 2019-06-05 NOTE — Progress Notes (Signed)
Office Visit Note   Patient: Glenn Robertson           Date of Birth: 1966/08/08           MRN: [id number] Visit Date: 06/05/2019              Requested by: Luetta Nutting, DO [place],  [place] [address] PCP: Luetta Nutting, DO   Assessment & Plan: Visit Diagnoses:  1. HNP (herniated nucleus pulposus), lumbar   2. Pain in left leg   3. Lumbosacral radiculopathy at S1   4. Other intervertebral disc degeneration, lumbar region     Plan: With patient's progressively worsening symptoms with low back pain, left lower extremity radiculopathy and left foot numbness and tingling I recommend repeating lumbar MRI and comparing to study that was done 2019.  He has failed conservative treatment up to this point.  States that he would like to entertain surgical intervention if this is needed.  Follow-up after completion of his study to discuss results and further treatment options.  Follow-Up Instructions: Return in about 3 weeks (around 06/26/2019) for To review lumbar MRI.   Orders:  Orders Placed This Encounter  Procedures  . MR LUMBAR SPINE WO CONTRAST   No orders of the defined types were placed in this encounter.     Procedures: No procedures performed   Clinical Data: No additional findings.   Subjective: Chief Complaint  Patient presents with  . Lower Back - Pain  . Left Leg - Pain    HPI 53 year old black male with history of left L5-S1 HNP comes in with worsening low back pain and left lower extremity radiculopathy.  He did have lumbar MRI November 25, 2017 and that showed L5-S1 broad disc protrusion eccentric towards the left.  Disc abuts bilateral S1 nerve roots.  No evidence of neural foraminal stenosis.  No central canal stenosis.  Patient states that his symptoms have worsened since that study.  Has left greater than right plantar foot numbness and tingling.  Left side has been getting to be more constant.  Left-sided lumbar spasms with pain that  also radiated into the left buttocks and down his leg.  He has been doing some home exercises without much improvement. Review of Systems No current cardiac pulmonary GI GU issues  Objective: Vital Signs: Ht 5\' 10"  (1.778 m)   Wt 193 lb (87.5 kg)   BMI 27.69 kg/m   Physical Exam HENT:     Head: Normocephalic and atraumatic.  Pulmonary:     Effort: No respiratory distress.  Musculoskeletal:     Comments: Gait is been antalgic.  Patient has left-sided lumbar paraspinal tenderness.  Positive left straight leg raise.  Neurovascular intact.  No focal motor deficits.  Bilateral calves nontender.  Neurological:     General: No focal deficit present.     Mental Status: He is alert and oriented to person, place, and time.     Ortho Exam  Specialty Comments:  No specialty comments available.  Imaging: No results found.   PMFS History: Patient Active Problem List   Diagnosis Date Noted  . Schizoaffective disorder, depressive type ([place]) 11/27/2018  . Urinary hesitancy 10/08/2018  . Erectile dysfunction 09/24/2018  . Transient alteration of awareness 08/08/2018  . Tendinopathy of right gluteus medius 05/22/2018  . Pain of right hip joint 05/08/2018  . Hearing difficulty 04/13/2018  . Trochanteric bursitis, right hip 03/23/2018  . Asthma 03/16/2018  . Headache 03/02/2018  . Type 2  diabetes mellitus without complication, without long-term current use of insulin ([place]) 02/16/2018  . Screening for colon cancer 02/16/2018  . Pain in left leg 10/28/2016  . Lumbosacral radiculopathy at S1 03/15/2016  . Lumbar degenerative disc disease 12/18/2015  . Disc displacement, lumbar 04/21/2015  . Chondromalacia of both patellae 02/24/2015  . Paranoid schizophrenia, chronic condition ([place]) 02/24/2015  . Right knee pain 07/18/2014  . Right ankle pain 01/27/2014  . Pain in joint, ankle and foot 01/27/2014  . Sciatica 01/10/2012   Past Medical History:  Diagnosis Date  . Allergy   .  Anxiety   . Asthma   . Chronic pain   . Depression   . Diabetes mellitus   . Neuromuscular disorder ([place])     Family History  Problem Relation Age of Onset  . Diabetes Father   . Diabetes Paternal Uncle     Past Surgical History:  Procedure Laterality Date  . ANKLE ARTHROSCOPY    . Arm Surgery  1/12   rt ulnar nerve decompression  . EPIDURAL BLOCK INJECTION     several  . ULNAR NERVE TRANSPOSITION  01/19/2012   Procedure: ULNAR NERVE DECOMPRESSION/TRANSPOSITION;  Surgeon: Cammie Sickle., MD;  Location: [facility];  Service: Orthopedics;  Laterality: Left;  ulnar nerve decompression vs transposition left elbow   Social History   Occupational History  . Occupation: Unemlpoyed-disabled  Tobacco Use  . Smoking status: Never Smoker  . Smokeless tobacco: Never Used  Substance and Sexual Activity  . Alcohol use: No  . Drug use: No  . Sexual activity: Not on file

## 2019-06-12 ENCOUNTER — Other Ambulatory Visit (HOSPITAL_COMMUNITY): Payer: Self-pay

## 2019-06-12 ENCOUNTER — Other Ambulatory Visit: Payer: Self-pay | Admitting: Family Medicine

## 2019-06-12 MED ORDER — TRAZODONE HCL 100 MG PO TABS: 100.0000 mg | ORAL_TABLET | Freq: Every evening | ORAL | 1 refills | Status: DC | PRN

## 2019-06-15 ENCOUNTER — Ambulatory Visit
Admission: RE | Admit: 2019-06-15 | Discharge: 2019-06-15 | Disposition: A | Discharge status: Home / Self Care | Payer: Medicare Other | Source: Ambulatory Visit | Attending: Surgery | Admitting: Surgery

## 2019-06-15 ENCOUNTER — Other Ambulatory Visit: Payer: Self-pay

## 2019-06-15 DIAGNOSIS — M545 Low back pain: Secondary | ICD-10-CM | POA: Diagnosis not present

## 2019-06-15 DIAGNOSIS — M5136 Other intervertebral disc degeneration, lumbar region: Secondary | ICD-10-CM

## 2019-06-20 ENCOUNTER — Ambulatory Visit (INDEPENDENT_AMBULATORY_CARE_PROVIDER_SITE_OTHER): Payer: Medicare Other | Admitting: Surgery

## 2019-06-20 ENCOUNTER — Other Ambulatory Visit: Payer: Self-pay

## 2019-06-20 ENCOUNTER — Encounter: Payer: Self-pay | Admitting: Family Medicine

## 2019-06-20 VITALS — Ht 70.0 in | Wt 194.0 lb

## 2019-06-20 DIAGNOSIS — M5416 Radiculopathy, lumbar region: Secondary | ICD-10-CM

## 2019-06-20 NOTE — Progress Notes (Signed)
52 year old black male returns for review of lumbar MRI scan that was performed June 15, 2019.  Scan showed:  CLINICAL DATA:  Low back pain radiating to both legs. Numbness of the feet. Symptoms began in June of this year.  EXAM: MRI LUMBAR SPINE WITHOUT CONTRAST  TECHNIQUE: Multiplanar, multisequence MR imaging of the lumbar spine was performed. No intravenous contrast was administered.  COMPARISON:  Radiography 03/12/2019.  MRI 11/25/2017.  FINDINGS: Segmentation:  5 lumbar type vertebral bodies.  Alignment:  Normal  Vertebrae: Discogenic endplate marrow changes at L5-S1 that could be associated with back pain.  Conus medullaris and cauda equina: Conus extends to the L1 level. Conus and cauda equina appear normal.  Paraspinal and other soft tissues: Negative  Disc levels:  No abnormality at T12-L1 or L1-2.  L2-3: Mild bulging of the disc.  No stenosis.  L3-4: Mild bulging of the disc.  No stenosis.  L4-5: Mild bulging of the disc.  No stenosis.  L5-S1: Disc degeneration with discogenic endplate marrow changes which could be associated with back pain. Mild bulging of the disc. No compressive stenosis.  Compared to the study of 2019, the findings are similar, with the addition of the discogenic endplate changes at 075-GRM.  IMPRESSION: Degenerative disc disease from L2-3 through L5-S1 with disc bulges but no apparent compressive stenosis of the canal or foramina. Since the study of 2019, there has been development of discogenic edematous change within the vertebral body endplates at 075-GRM, which could be associated with low back pain  States that pain unchanged from previous visit.  Pain is not limiting him and only aggravated with certain activities.  States that he does some exercises called a single leg and bridge where he lays on his back and bridges up.  This causes pain in the left buttock and hamstring area.   Plan I reviewed MRI with  Dr. Ernestina Patches.  He recommended trying bilateral S1 nerve root blocks so this was scheduled.  Patient does not have any specific nerve root compression seen on MRI.  If he does not have any relief with the injections we may consider getting NCV/EMG studies versus referring him to a neurologist.  Follow-up with Dr. Inda Merlin in 4 weeks for recheck.

## 2019-06-28 ENCOUNTER — Other Ambulatory Visit: Payer: Self-pay

## 2019-06-28 ENCOUNTER — Encounter (HOSPITAL_COMMUNITY): Payer: Self-pay | Admitting: Emergency Medicine

## 2019-06-28 ENCOUNTER — Emergency Department (HOSPITAL_COMMUNITY): Payer: Medicare Other

## 2019-06-28 ENCOUNTER — Emergency Department (HOSPITAL_COMMUNITY)
Admission: EM | Admit: 2019-06-28 | Discharge: 2019-06-28 | Disposition: A | Discharge status: Home / Self Care | Payer: Medicare Other | Attending: Emergency Medicine | Admitting: Emergency Medicine

## 2019-06-28 DIAGNOSIS — Z23 Encounter for immunization: Secondary | ICD-10-CM | POA: Insufficient documentation

## 2019-06-28 DIAGNOSIS — S0181XA Laceration without foreign body of other part of head, initial encounter: Secondary | ICD-10-CM

## 2019-06-28 DIAGNOSIS — Y929 Unspecified place or not applicable: Secondary | ICD-10-CM | POA: Insufficient documentation

## 2019-06-28 DIAGNOSIS — W0110XA Fall on same level from slipping, tripping and stumbling with subsequent striking against unspecified object, initial encounter: Secondary | ICD-10-CM | POA: Insufficient documentation

## 2019-06-28 DIAGNOSIS — Y999 Unspecified external cause status: Secondary | ICD-10-CM | POA: Diagnosis not present

## 2019-06-28 DIAGNOSIS — Z79899 Other long term (current) drug therapy: Secondary | ICD-10-CM | POA: Insufficient documentation

## 2019-06-28 DIAGNOSIS — S0990XA Unspecified injury of head, initial encounter: Secondary | ICD-10-CM

## 2019-06-28 DIAGNOSIS — Y939 Activity, unspecified: Secondary | ICD-10-CM | POA: Diagnosis not present

## 2019-06-28 DIAGNOSIS — S01111A Laceration without foreign body of right eyelid and periocular area, initial encounter: Secondary | ICD-10-CM | POA: Insufficient documentation

## 2019-06-28 DIAGNOSIS — Z7984 Long term (current) use of oral hypoglycemic drugs: Secondary | ICD-10-CM | POA: Insufficient documentation

## 2019-06-28 DIAGNOSIS — J45909 Unspecified asthma, uncomplicated: Secondary | ICD-10-CM | POA: Diagnosis not present

## 2019-06-28 DIAGNOSIS — E119 Type 2 diabetes mellitus without complications: Secondary | ICD-10-CM | POA: Insufficient documentation

## 2019-06-28 DIAGNOSIS — R55 Syncope and collapse: Secondary | ICD-10-CM | POA: Diagnosis not present

## 2019-06-28 LAB — CBC WITH DIFFERENTIAL/PLATELET
Abs Immature Granulocytes: 0.02 10*3/uL (ref 0.00–0.07)
Basophils Absolute: 0 10*3/uL (ref 0.0–0.1)
Basophils Relative: 0 %
Eosinophils Absolute: 0.1 10*3/uL (ref 0.0–0.5)
Eosinophils Relative: 1 %
HCT: 47.5 % (ref 39.0–52.0)
Hemoglobin: 15.4 g/dL (ref 13.0–17.0)
Immature Granulocytes: 0 %
Lymphocytes Relative: 14 %
Lymphs Abs: 1.3 10*3/uL (ref 0.7–4.0)
MCH: 28.8 pg (ref 26.0–34.0)
MCHC: 32.4 g/dL (ref 30.0–36.0)
MCV: 89 fL (ref 80.0–100.0)
Monocytes Absolute: 0.7 10*3/uL (ref 0.1–1.0)
Monocytes Relative: 8 %
Neutro Abs: 6.8 10*3/uL (ref 1.7–7.7)
Neutrophils Relative %: 77 %
Platelets: 219 10*3/uL (ref 150–400)
RBC: 5.34 MIL/uL (ref 4.22–5.81)
RDW: 11.9 % (ref 11.5–15.5)
WBC: 8.9 10*3/uL (ref 4.0–10.5)
nRBC: 0 % (ref 0.0–0.2)

## 2019-06-28 LAB — BASIC METABOLIC PANEL
Anion gap: 9 (ref 5–15)
BUN: 18 mg/dL (ref 6–20)
CO2: 25 mmol/L (ref 22–32)
Calcium: 9.2 mg/dL (ref 8.9–10.3)
Chloride: 104 mmol/L (ref 98–111)
Creatinine, Ser: 1.12 mg/dL (ref 0.61–1.24)
GFR calc Af Amer: >60 mL/min (ref >60)
GFR calc non Af Amer: >60 mL/min (ref >60)
Glucose, Bld: 165 mg/dL — ABNORMAL HIGH (ref 70–99)
Potassium: 3.7 mmol/L (ref 3.5–5.1)
Sodium: 138 mmol/L (ref 135–145)

## 2019-06-28 LAB — CBG MONITORING, ED: Glucose-Capillary: 180 mg/dL — ABNORMAL HIGH (ref 70–99)

## 2019-06-28 MED ORDER — LIDOCAINE-EPINEPHRINE (PF) 2 %-1:200000 IJ SOLN
10.0000 mL | Freq: Once | INTRAMUSCULAR | Status: AC
  Administered 2019-06-28: 10 mL via INTRADERMAL
  Filled 2019-06-28: qty 10

## 2019-06-28 MED ORDER — TETANUS-DIPHTH-ACELL PERTUSSIS 5-2.5-18.5 LF-MCG/0.5 IM SUSP
0.5000 mL | Freq: Once | INTRAMUSCULAR | Status: AC
  Administered 2019-06-28: 0.5 mL via INTRAMUSCULAR
  Filled 2019-06-28: qty 0.5

## 2019-06-28 NOTE — ED Provider Notes (Signed)
[facility] DEPT Provider Note   CSN: [id number] Arrival date & Robertson: 06/28/19  [id number]     History   Chief Complaint Chief Complaint  Patient presents with  . Fall    HPI Glenn Robertson is a 53 y.o. male.     The history is provided by the patient Glenn medical records. No language interpreter was used.  Fall    Glenn Robertson is a 53 y.o. male  with a PMH as listed below who presents to the Emergency Department for evaluation after head injury last night. Patient reports that Glenn fell over a chair Glenn struck the front of his head. Reports laceration above the right eyebrow. Glenn put a bandage on it, but this morning, Glenn still could not get bleeding controlled. Not on anti-coagulation. Patient endorses possibility that Glenn may have lost consciousness. Follows with neurology for the "spells".  No chest pain or shortness of breath.  No numbness or weakness.  No medications prior to arrival for symptoms.   Past Medical History:  Diagnosis Date  . Allergy   . Anxiety   . Asthma   . Chronic pain   . Depression   . Diabetes mellitus   . Neuromuscular disorder [facility])     Patient Active Problem List   Diagnosis Date Noted  . Schizoaffective disorder, depressive type ([place]) 11/27/2018  . Urinary hesitancy 10/08/2018  . Erectile dysfunction 09/24/2018  . Transient alteration of awareness 08/08/2018  . Tendinopathy of right gluteus medius 05/22/2018  . Pain of right hip joint 05/08/2018  . Hearing difficulty 04/13/2018  . Trochanteric bursitis, right hip 03/23/2018  . Asthma 03/16/2018  . Headache 03/02/2018  . Type 2 diabetes mellitus without complication, without long-term current use of insulin ([place]) 02/16/2018  . Screening for colon cancer 02/16/2018  . Pain in left leg 10/28/2016  . Lumbosacral radiculopathy at S1 03/15/2016  . Lumbar degenerative disc disease 12/18/2015  . Disc displacement, lumbar 04/21/2015  . Chondromalacia of both patellae  02/24/2015  . Paranoid schizophrenia, chronic condition ([place]) 02/24/2015  . Right knee pain 07/18/2014  . Right ankle pain 01/27/2014  . Pain in joint, ankle Glenn foot 01/27/2014  . Sciatica 01/10/2012    Past Surgical History:  Procedure Laterality Date  . ANKLE ARTHROSCOPY    . Arm Surgery  1/12   rt ulnar nerve decompression  . EPIDURAL BLOCK INJECTION     several  . ULNAR NERVE TRANSPOSITION  01/19/2012   Procedure: ULNAR NERVE DECOMPRESSION/TRANSPOSITION;  Surgeon: Cammie Sickle., MD;  Location: [place];  Service: Orthopedics;  Laterality: Left;  ulnar nerve decompression vs transposition left elbow        Home Medications    Prior to Admission medications   Medication Sig Start Date End Date Taking? Authorizing Provider  ACCU-CHEK AVIVA PLUS test strip TEST THREE TIMES DAILY 03/26/19   Luetta Nutting, DO  ACCU-CHEK SOFTCLIX LANCETS lancets 3 (three) times daily. for testing 10/11/17   [provider]  albuterol (PROVENTIL HFA;VENTOLIN HFA) 108 (90 Base) MCG/ACT inhaler Inhale 2 puffs into the lungs as needed. 09/17/18   Luetta Nutting, DO  ASPIRIN 81 PO Take 1 tablet by mouth daily.    [provider]  Cariprazine HCl 6 MG CAPS Take 1 capsule (6 mg total) by mouth daily. 04/22/19 07/21/19  Pucilowski, Marchia Bond, MD  clonazePAM (KLONOPIN) 1 MG tablet Take 1 tablet (1 mg total) by mouth 2 (two) times daily as needed for anxiety.  TAKE 1 TABLET(1 MG) BY MOUTH TWICE DAILY AS NEEDED 04/22/19 07/21/19  Pucilowski, Marchia Bond, MD  empagliflozin (JARDIANCE) 25 MG TABS tablet Take 25 mg by mouth daily. 12/25/18   Luetta Nutting, DO  fluticasone (FLONASE) 50 MCG/ACT nasal spray Glenn Robertson 2 sprays into both nostrils daily. 08/08/18   Luetta Nutting, DO  glipiZIDE (GLUCOTROL) 10 MG tablet TAKE 1 TABLET(10 MG) BY MOUTH TWICE DAILY 03/20/19   Luetta Nutting, DO  LUMIGAN 0.01 % SOLN  10/19/17   [provider]  Meloxicam (MOBIC PO) Take 1 Dose by mouth as  needed. FOR HIP PAIN    [provider]  metFORMIN (GLUCOPHAGE-XR) 500 MG 24 hr tablet Take 4 tablets (2,000 mg total) by mouth daily with breakfast. 01/11/19 04/11/19  Luetta Nutting, DO  montelukast (SINGULAIR) 10 MG tablet TAKE 1 TABLET(10 MG) BY MOUTH AT BEDTIME 05/07/19   Luetta Nutting, DO  sertraline (ZOLOFT) 100 MG tablet Take 1 tablet (100 mg total) by mouth daily. 05/27/19 07/26/19  Pucilowski, Marchia Bond, MD  simvastatin (ZOCOR) 20 MG tablet TAKE 1 TABLET(20 MG) BY MOUTH EVERY EVENING 06/12/19   Luetta Nutting, DO  tadalafil (CIALIS) 20 MG tablet TAKE ONE-HALF TO ONE TABLET BY MOUTH EVERY OTHER DAY AS NEEDED FOR ERECTILE DYSFUNCTION 05/23/19   Luetta Nutting, DO  tamsulosin (FLOMAX) 0.4 MG CAPS capsule TK 1 C PO Q NIGHT 12/20/18   [provider]  traZODone (DESYREL) 100 MG tablet Take 1 tablet (100 mg total) by mouth at bedtime as needed for sleep. 06/12/19 08/11/19  Pucilowski, Marchia Bond, MD  pregabalin (LYRICA) 100 MG capsule Take 100 mg by mouth daily.  11/14/11  [provider]    Family History Family History  Problem Relation Age of Onset  . Diabetes Father   . Diabetes Paternal Uncle     Social History Social History   Tobacco Use  . Smoking status: Never Smoker  . Smokeless tobacco: Never Used  Substance Use Topics  . Alcohol use: No  . Drug use: No     Allergies   Citric acid Glenn Food   Review of Systems Review of Systems  Skin: Positive for wound.  Neurological: Positive for syncope. Negative for weakness Glenn numbness.  All other systems reviewed Glenn are negative.    Physical Exam Updated Vital Signs BP 140/82   Pulse 81   Temp 98 F (36.7 C) (Oral)   Resp (!) 24   SpO2 99%   Physical Exam Vitals signs Glenn nursing note reviewed.  Constitutional:      General: Glenn is not in acute distress.    Appearance: Glenn is well-developed.  HENT:     Head: Normocephalic.     Comments: 2 cm laceration to right eyebrow.  Abrasion just  above this as well. Neck:     Musculoskeletal: Neck supple.  Cardiovascular:     Rate Glenn Rhythm: Normal rate Glenn regular rhythm.     Heart sounds: Normal heart sounds. No murmur.  Pulmonary:     Effort: Pulmonary effort is normal. No respiratory distress.     Breath sounds: Normal breath sounds.  Abdominal:     General: There is no distension.     Palpations: Abdomen is soft.     Tenderness: There is no abdominal tenderness.  Musculoskeletal:     Comments: No C/T/L-spine tenderness.  5/5 muscle strength in all 4 extremities.  Skin:    General: Skin is warm Glenn dry.  Neurological:     Mental  Status: Glenn Robertson, Glenn Robertson, Glenn Robertson.     Comments: Alert, oriented, thought content appropriate. Able to give a coherent history. Speech is clear Glenn goal oriented, able to follow commands.  Cranial Nerves:  II:  Peripheral visual fields grossly normal, pupils equal, round, reactive to light III, IV, VI: EOM intact bilaterally, ptosis not present V,VII: smile symmetric, eyes kept closed tightly against resistance, facial light touch sensation equal VIII: hearing grossly normal IX, X: symmetric soft palate movement, uvula elevates symmetrically  XI: bilateral shoulder shrug symmetric Glenn strong XII: midline tongue extension Normal gait Glenn balance.      ED Treatments / Results  Labs (all labs ordered are listed, but only abnormal results are displayed) Labs Reviewed  BASIC METABOLIC PANEL - Abnormal; Notable for the following components:      Result Value   Glucose, Bld 165 (*)    All other components within normal limits  CBG MONITORING, ED - Abnormal; Notable for the following components:   Glucose-Capillary 180 (*)    All other components within normal limits  CBC WITH DIFFERENTIAL/PLATELET    EKG None  Radiology Ct Head Wo Contrast  Result Date: 06/28/2019 CLINICAL DATA:  Minor head trauma. Fall. Hit right-side of face with laceration. EXAM: CT HEAD  WITHOUT CONTRAST TECHNIQUE: Contiguous axial images were obtained from the base of the skull through the vertex without intravenous contrast. COMPARISON:  09/04/2018 FINDINGS: Brain: No evidence of acute infarction, hemorrhage, hydrocephalus, extra-axial collection or mass lesion/mass effect. Vascular: No hyperdense vessel or unexpected calcification. Skull: Normal. Negative for fracture or focal lesion. Sinuses/Orbits: The paranasal sinuses Glenn mastoid air cells are clear. Other: Right periorbital hematoma Glenn laceration noted, image 5/2. IMPRESSION: 1. No acute intracranial abnormality. 2. Right periorbital hematoma Glenn laceration. Electronically Signed   By: Kerby Moors M.D.   On: 06/28/2019 11:32    Procedures .Marland KitchenLaceration Repair  Date/Robertson: 06/28/2019 1:09 PM Performed by: Aaryan Essman, Ozella Almond, PA-C Authorized by: Dailee Manalang, Ozella Almond, PA-C   Consent:    Consent obtained:  Verbal   Consent given by:  Patient   Risks discussed:  Pain, infection, poor cosmetic result Glenn poor wound healing Anesthesia (see MAR for exact dosages):    Anesthesia method:  Local infiltration   Local anesthetic:  Lidocaine 2% WITH epi Laceration details:    Location:  Face   Face location:  R eyebrow   Length (cm):  2 Repair type:    Repair type:  Simple Pre-procedure details:    Preparation:  Patient was prepped Glenn draped in usual sterile fashion Exploration:    Hemostasis achieved with:  Direct pressure Glenn epinephrine   Wound exploration: wound explored through full range of motion Glenn entire depth of wound probed Glenn visualized   Treatment:    Area cleansed with:  Saline   Amount of cleaning:  Standard   Irrigation solution:  Sterile saline Skin repair:    Repair method:  Tissue adhesive Glenn sutures   Suture size:  5-0   Wound skin closure material used: Vicryl rapide.   Suture technique:  Simple interrupted   Number of sutures:  2 Approximation:    Approximation:  Close Post-procedure  details:    Patient tolerance of procedure:  Tolerated well, no immediate complications   (including critical care Robertson)  Medications Ordered in ED Medications  Tdap (BOOSTRIX) injection 0.5 mL (0.5 mLs Intramuscular Given 06/28/19 1232)  lidocaine-EPINEPHrine (XYLOCAINE W/EPI) 2 %-1:200000 (PF) injection 10 mL (10  mLs Intradermal Given by Other 06/28/19 1315)     Initial Impression / Assessment Glenn Plan / ED Course  I have reviewed the triage vital signs Glenn the nursing notes.  Pertinent labs & imaging results that were available during my care of the patient were reviewed by me Glenn considered in my medical decision making (see chart for details).       Glenn Robertson is a 53 y.o. male who presents to ED for evaluation after head injury last night. Normal neuro exam. CT head without acute findings. Labs Glenn EKG reviewed Glenn reassuring. Patient initially wanted to avoid suturing, therefore laceration was repaired with dermabond, however one small area continued to bleed. I did end up putting a little lido with epi Glenn 2 sutures which controlled bleeding. Discussed home wound care, follow up care Glenn return precautions. All questions answered.     Final Clinical Impressions(s) / ED Diagnoses   Final diagnoses:  Minor head injury, initial encounter  Facial laceration, initial encounter    ED Discharge Orders    None       Kauan Kloosterman, Ozella Almond, PA-C 06/28/19 1316    Pattricia Boss, MD 06/28/19 [phone]

## 2019-06-28 NOTE — Discharge Instructions (Signed)
It was my pleasure taking care of you today!   Keep wound clean and dry. Stitches should dissolve on their own, but if they do not, return to ER or see your primary care doctor for suture removal in 7 days.   Follow up with your doctors.   Return to ER for new or worsening symptoms, any additional concerns.

## 2019-06-28 NOTE — ED Triage Notes (Signed)
Per pt, states he fell and hit his head last night-doesn't remember incident-thinks it might have been due to low CBG-laceration above right eye-complaining of back pain

## 2019-07-01 ENCOUNTER — Ambulatory Visit (INDEPENDENT_AMBULATORY_CARE_PROVIDER_SITE_OTHER): Payer: Medicare Other | Admitting: Psychiatry

## 2019-07-01 ENCOUNTER — Other Ambulatory Visit: Payer: Self-pay

## 2019-07-01 DIAGNOSIS — F251 Schizoaffective disorder, depressive type: Secondary | ICD-10-CM

## 2019-07-01 DIAGNOSIS — R404 Transient alteration of awareness: Secondary | ICD-10-CM

## 2019-07-01 MED ORDER — SERTRALINE HCL 100 MG PO TABS: 100.0000 mg | ORAL_TABLET | Freq: Every day | ORAL | 2 refills | Status: DC

## 2019-07-01 MED ORDER — CLONAZEPAM 1 MG PO TABS: 1.0000 mg | ORAL_TABLET | Freq: Two times a day (BID) | ORAL | 2 refills | Status: DC | PRN

## 2019-07-01 MED ORDER — CARIPRAZINE HCL 6 MG PO CAPS: 6.0000 mg | ORAL_CAPSULE | Freq: Every day | ORAL | 2 refills | Status: DC

## 2019-07-01 MED ORDER — TRAZODONE HCL 100 MG PO TABS: 100.0000 mg | ORAL_TABLET | Freq: Every evening | ORAL | 1 refills | Status: DC | PRN

## 2019-07-01 NOTE — Progress Notes (Signed)
[place] MD/PA/NP OP Progress Note  07/01/2019 8:44 AM Glenn Robertson  MRN:  [id number] Interview was conducted by phone and I verified that I was speaking with the correct person using two identifiers. I discussed the limitations of evaluation and management by telemedicine and  the availability of in person appointments. Patient expressed understanding and agreed to proceed.  Chief Complaint: Middle insomnia, anxiety.  HPI: 53 yo single AAM with past dx of schizophrenia paranoid type vsschizoaffective disorder. Patient has been seen in this office exactly a year ago by Dr. Germaine Pomfret but then followed with Dr. Rosine Door. Not much changed in his history from the original H&P by Dr. Daron Offer. Patient has no hx of suicidal attempts but admits to hx of  on and off SI, he has a long hx of depression and excessive worrying as well as paranoid and grandiose delusions. He expressed havingsome bizarre perceptual disturbances e.g. that he suddenly stops hearing someone who he is talking to or the volume of conversation gradually decreases.He also reports brief, infrequent periods that he loses track of time. He has seen a neurologist for this in the past but "they did not find anything".We continuedclonazepam prn anxiety and insomnia. Dc [name] (no benefit) and insteadaddedVraylarfor paranoia. He appears less paranoid though still anxious (takes clonazepam  Bid). Denies having AH. His sleepis fair but interrupted (has to go to the bathroom). Appetite is good.   Visit Diagnosis:    ICD-10-CM   1. Schizoaffective disorder, depressive type ([place])  F25.1 clonazePAM (KLONOPIN) 1 MG tablet  2. Transient alteration of awareness  R40.4 clonazePAM (KLONOPIN) 1 MG tablet    Past Psychiatric History: Please see intake H&P.  Past Medical History:  Past Medical History:  Diagnosis Date  . Allergy   . Anxiety   . Asthma   . Chronic pain   . Depression   . Diabetes mellitus   . Neuromuscular disorder [facility])      Past Surgical History:  Procedure Laterality Date  . ANKLE ARTHROSCOPY    . Arm Surgery  1/12   rt ulnar nerve decompression  . EPIDURAL BLOCK INJECTION     several  . ULNAR NERVE TRANSPOSITION  01/19/2012   Procedure: ULNAR NERVE DECOMPRESSION/TRANSPOSITION;  Surgeon: Cammie Sickle., MD;  Location: [place];  Service: Orthopedics;  Laterality: Left;  ulnar nerve decompression vs transposition left elbow    Family Psychiatric History: Non.  Family History:  Family History  Problem Relation Age of Onset  . Diabetes Father   . Diabetes Paternal Uncle     Social History:  Social History   Socioeconomic History  . Marital status: Single    Spouse name: Not on file  . Number of children: 0  . Years of education: BA  . Highest education level: Not on file  Occupational History  . Occupation: Unemlpoyed-disabled  Social Needs  . Financial resource strain: Not on file  . Food insecurity    Worry: Not on file    Inability: Not on file  . Transportation needs    Medical: Not on file    Non-medical: Not on file  Tobacco Use  . Smoking status: Never Smoker  . Smokeless tobacco: Never Used  Substance and Sexual Activity  . Alcohol use: No  . Drug use: No  . Sexual activity: Not on file  Lifestyle  . Physical activity    Days per week: Not on file    Minutes per session: Not on file  .  Stress: Not on file  Relationships  . Social Herbalist on phone: Not on file    Gets together: Not on file    Attends religious service: Not on file    Active member of club or organization: Not on file    Attends meetings of clubs or organizations: Not on file    Relationship status: Not on file  Other Topics Concern  . Not on file  Social History Narrative   Lives with mother   Caffeine use: Coke very rare   Right handed     Allergies:  Allergies  Allergen Reactions  . Citric Acid Other (See Comments)    Runny nose, eyes water  . Food      Oranges or anything with citric acid    Metabolic Disorder Labs: Lab Results  Component Value Date   HGBA1C 7.8 (H) 03/21/2019   MPG 123 03/02/2018   No results found for: PROLACTIN Lab Results  Component Value Date   CHOL 118 09/24/2018   TRIG 70.0 09/24/2018   HDL 49.70 09/24/2018   CHOLHDL 2 09/24/2018   VLDL 14.0 09/24/2018   LDLCALC 55 09/24/2018   Lab Results  Component Value Date   TSH 0.92 03/15/2018    Therapeutic Level Labs: No results found for: LITHIUM No results found for: VALPROATE No components found for:  CBMZ  Current Medications: Current Outpatient Medications  Medication Sig Dispense Refill  . ACCU-CHEK AVIVA PLUS test strip TEST THREE TIMES DAILY 100 strip 2  . ACCU-CHEK SOFTCLIX LANCETS lancets 3 (three) times daily. for testing  0  . albuterol (PROVENTIL HFA;VENTOLIN HFA) 108 (90 Base) MCG/ACT inhaler Inhale 2 puffs into the lungs as needed. 1 Inhaler 2  . ASPIRIN 81 PO Take 1 tablet by mouth daily.    Derrill Memo ON 07/21/2019] Cariprazine HCl 6 MG CAPS Take 1 capsule (6 mg total) by mouth daily. 30 capsule 2  . [START ON 07/21/2019] clonazePAM (KLONOPIN) 1 MG tablet Take 1 tablet (1 mg total) by mouth 2 (two) times daily as needed for anxiety. TAKE 1 TABLET(1 MG) BY MOUTH TWICE DAILY AS NEEDED 60 tablet 2  . empagliflozin (JARDIANCE) 25 MG TABS tablet Take 25 mg by mouth daily. 30 tablet 6  . fluticasone (FLONASE) 50 MCG/ACT nasal spray Place 2 sprays into both nostrils daily. 16 g 6  . glipiZIDE (GLUCOTROL) 10 MG tablet TAKE 1 TABLET(10 MG) BY MOUTH TWICE DAILY 180 tablet 3  . LUMIGAN 0.01 % SOLN   0  . Meloxicam (MOBIC PO) Take 1 Dose by mouth as needed. FOR HIP PAIN    . metFORMIN (GLUCOPHAGE-XR) 500 MG 24 hr tablet Take 4 tablets (2,000 mg total) by mouth daily with breakfast. 360 tablet 1  . montelukast (SINGULAIR) 10 MG tablet TAKE 1 TABLET(10 MG) BY MOUTH AT BEDTIME 90 tablet 3  . [START ON 07/26/2019] sertraline (ZOLOFT) 100 MG tablet Take 1  tablet (100 mg total) by mouth daily. 30 tablet 2  . simvastatin (ZOCOR) 20 MG tablet TAKE 1 TABLET(20 MG) BY MOUTH EVERY EVENING 90 tablet 3  . tadalafil (CIALIS) 20 MG tablet TAKE ONE-HALF TO ONE TABLET BY MOUTH EVERY OTHER DAY AS NEEDED FOR ERECTILE DYSFUNCTION 10 tablet 3  . tamsulosin (FLOMAX) 0.4 MG CAPS capsule TK 1 C PO Q NIGHT    . [START ON 08/11/2019] traZODone (DESYREL) 100 MG tablet Take 1 tablet (100 mg total) by mouth at bedtime as needed for sleep. 30 tablet 1  No current facility-administered medications for this visit.       Psychiatric Specialty Exam: Review of Systems  Musculoskeletal: Positive for falls.  Psychiatric/Behavioral: The patient is nervous/anxious.   All other systems reviewed and are negative.   There were no vitals taken for this visit.There is no height or weight on file to calculate BMI.  General Appearance: NA  Eye Contact:  NA  Speech:  Clear and Coherent and Normal Rate  Volume:  Normal  Mood:  Anxious  Affect:  NA  Thought Process:  Goal Directed and Linear  Orientation:  Full (Time, Place, and Person)  Thought Content: Logical   Suicidal Thoughts:  No  Homicidal Thoughts:  No  Memory:  Immediate;   Good Recent;   Good Remote;   Good  Judgement:  Good  Insight:  Fair  Psychomotor Activity:  NA  Concentration:  Concentration: Good  Recall:  Good  Fund of Knowledge: Good  Language: Good  Akathisia:  Negative  Handed:  Right  AIMS (if indicated): not done  Assets:  Communication Skills Desire for Improvement Financial Resources/Insurance Housing Resilience  ADL's:  Intact  Cognition: WNL  Sleep:  Fair   Screenings: PHQ2-9     Nutrition [facility]09/2018 [facility] Visit from 10/28[facility]2016 [facility] [place] [facility] 04/18/2016 in [place] Office Visit from 03/02/2016 in [place]  PHQ-2 Total Score  0  0  0  0  0       Assessment and Plan: 53 yo single AAM with past dx of schizophrenia paranoid type vsschizoaffective disorder. Patient has been seen in this office exactly a year ago by Dr. Germaine Pomfret but then followed with Dr. Rosine Door. Not much changed in his history from the original H&P by Dr. Daron Offer. Patient has no hx of suicidal attempts but admits to hx of  on and off SI, he has a long hx of depression and excessive worrying as well as paranoid and grandiose delusions. He expressed havingsome bizarre perceptual disturbances e.g. that he suddenly stops hearing someone who he is talking to or the volume of conversation gradually decreases.He also reports brief, infrequent periods that he loses track of time. He has seen a neurologist for this in the past but "they did not find anything".We continuedclonazepam prn anxiety and insomnia. Dc [name] (no benefit) and insteadaddedVraylarfor paranoia. He appears less paranoid though still anxious (takes clonazepam  Bid). Denies having AH. His sleepis fair but interrupted (has to go to the bathroom). Appetite is good. Recent fall which he believes was related to low blood sugar.  Dx: Schizoaffective disorder depressed type  Plan: Continue Zoloft 100 mg, Vraylar 6 mg, Klonopin 1 mg bid and trazodone 100 mg at HS for sleep.The plan was discussed with patient who had an opportunity to ask questions and these were all answered.Next appointment in 2 months. I spend 25 min in phone consultation with the patient.   Stephanie Acre, MD 07/01/2019, 8:44 AM

## 2019-07-02 ENCOUNTER — Ambulatory Visit (INDEPENDENT_AMBULATORY_CARE_PROVIDER_SITE_OTHER): Payer: Medicare Other | Admitting: Psychology

## 2019-07-02 ENCOUNTER — Other Ambulatory Visit: Payer: Self-pay

## 2019-07-02 DIAGNOSIS — F411 Generalized anxiety disorder: Secondary | ICD-10-CM | POA: Diagnosis not present

## 2019-07-02 DIAGNOSIS — F200 Paranoid schizophrenia: Secondary | ICD-10-CM | POA: Diagnosis not present

## 2019-07-02 DIAGNOSIS — F4310 Post-traumatic stress disorder, unspecified: Secondary | ICD-10-CM | POA: Diagnosis not present

## 2019-07-02 DIAGNOSIS — F331 Major depressive disorder, recurrent, moderate: Secondary | ICD-10-CM | POA: Diagnosis not present

## 2019-07-05 ENCOUNTER — Encounter: Payer: Self-pay | Admitting: Family Medicine

## 2019-07-08 ENCOUNTER — Encounter: Payer: Self-pay | Admitting: Family Medicine

## 2019-07-08 ENCOUNTER — Ambulatory Visit (INDEPENDENT_AMBULATORY_CARE_PROVIDER_SITE_OTHER): Payer: Medicare Other | Admitting: Family Medicine

## 2019-07-08 ENCOUNTER — Other Ambulatory Visit: Payer: Self-pay

## 2019-07-08 VITALS — BP 118/74 | HR 64 | Temp 98.6°F | Ht 70.0 in | Wt 199.8 lb

## 2019-07-08 DIAGNOSIS — R209 Unspecified disturbances of skin sensation: Secondary | ICD-10-CM

## 2019-07-08 DIAGNOSIS — Z1211 Encounter for screening for malignant neoplasm of colon: Secondary | ICD-10-CM

## 2019-07-08 DIAGNOSIS — E119 Type 2 diabetes mellitus without complications: Secondary | ICD-10-CM

## 2019-07-08 LAB — COMPREHENSIVE METABOLIC PANEL
ALT: 15 U/L (ref 0–53)
AST: 16 U/L (ref 0–37)
Albumin: 4.5 g/dL (ref 3.5–5.2)
Alkaline Phosphatase: 61 U/L (ref 39–117)
BUN: 13 mg/dL (ref 6–23)
CO2: 27 mEq/L (ref 19–32)
Calcium: 9.9 mg/dL (ref 8.4–10.5)
Chloride: 103 mEq/L (ref 96–112)
Creatinine, Ser: 1.05 mg/dL (ref 0.40–1.50)
GFR: 89.45 mL/min (ref >60.00)
Glucose, Bld: 107 mg/dL — ABNORMAL HIGH (ref 70–99)
Potassium: 3.9 mEq/L (ref 3.5–5.1)
Sodium: 138 mEq/L (ref 135–145)
Total Bilirubin: 0.4 mg/dL (ref 0.2–1.2)
Total Protein: 6.8 g/dL (ref 6.0–8.3)

## 2019-07-08 LAB — MICROALBUMIN / CREATININE URINE RATIO
Creatinine,U: 30.7 mg/dL
Microalb Creat Ratio: 2.3 mg/g (ref 0.0–30.0)
Microalb, Ur: <0.7 mg/dL (ref 0.0–1.9)

## 2019-07-08 LAB — HEMOGLOBIN A1C: Hgb A1c MFr Bld: 7.1 % — ABNORMAL HIGH (ref 4.6–6.5)

## 2019-07-08 LAB — LIPID PANEL
Cholesterol: 124 mg/dL (ref 0–200)
HDL: 52.2 mg/dL (ref >39.00)
LDL Cholesterol: 50 mg/dL (ref 0–99)
NonHDL: 71.55
Total CHOL/HDL Ratio: 2
Triglycerides: 106 mg/dL (ref 0.0–149.0)
VLDL: 21.2 mg/dL (ref 0.0–40.0)

## 2019-07-08 LAB — TSH: TSH: 1.02 u[IU]/mL (ref 0.35–4.50)

## 2019-07-08 LAB — VITAMIN B12: Vitamin B-12: >1500 pg/mL — ABNORMAL HIGH (ref 211–911)

## 2019-07-08 NOTE — Progress Notes (Signed)
Glenn Robertson - 53 y.o. male MRN [id number]  Date of birth: 09-22-1965  Subjective Chief Complaint  Patient presents with  . Thyroid Problem    pt said he been having cold feet at times. he's concern & would like a thyroid, a1c, b12 check    HPI Glenn Robertson is a 53 y.o. male with history of T2DM, schizoaffective d/o and chronic joint pain here today for f/u of diabetes.  He also reports cold, tingling sensation in bilateral feet.  He is concerned about possible thyroid problem.  He reports blood sugar readings at home have ranged from 70-130.  He denies symptoms of hypoglycemia.  He has not been running as much due to issues from sciatica.  His weight is up about 6 lbs since last visit.   ROS:  A comprehensive ROS was completed and negative except as noted per HPI  Allergies  Allergen Reactions  . Citric Acid Other (See Comments)    Runny nose, eyes water  . Food     Oranges or anything with citric acid    Past Medical History:  Diagnosis Date  . Allergy   . Anxiety   . Asthma   . Chronic pain   . Depression   . Diabetes mellitus   . Neuromuscular disorder [facility])     Past Surgical History:  Procedure Laterality Date  . ANKLE ARTHROSCOPY    . Arm Surgery  1/12   rt ulnar nerve decompression  . EPIDURAL BLOCK INJECTION     several  . ULNAR NERVE TRANSPOSITION  01/19/2012   Procedure: ULNAR NERVE DECOMPRESSION/TRANSPOSITION;  Surgeon: Cammie Sickle., MD;  Location: [place];  Service: Orthopedics;  Laterality: Left;  ulnar nerve decompression vs transposition left elbow    Social History   Socioeconomic History  . Marital status: Single    Spouse name: Not on file  . Number of children: 0  . Years of education: BA  . Highest education level: Not on file  Occupational History  . Occupation: Unemlpoyed-disabled  Social Needs  . Financial resource strain: Not on file  . Food insecurity    Worry: Not on file    Inability: Not on file  .  Transportation needs    Medical: Not on file    Non-medical: Not on file  Tobacco Use  . Smoking status: Never Smoker  . Smokeless tobacco: Never Used  Substance and Sexual Activity  . Alcohol use: No  . Drug use: No  . Sexual activity: Not on file  Lifestyle  . Physical activity    Days per week: Not on file    Minutes per session: Not on file  . Stress: Not on file  Relationships  . Social Herbalist on phone: Not on file    Gets together: Not on file    Attends religious service: Not on file    Active member of club or organization: Not on file    Attends meetings of clubs or organizations: Not on file    Relationship status: Not on file  Other Topics Concern  . Not on file  Social History Narrative   Lives with mother   Caffeine use: Coke very rare   Right handed     Family History  Problem Relation Age of Onset  . Diabetes Father   . Diabetes Paternal Uncle     Health Maintenance  Topic Date Due  . HIV Screening  09/02/1981  . COLONOSCOPY  09/02/2016  . URINE MICROALBUMIN  03/03/2019  . PNEUMOCOCCAL POLYSACCHARIDE VACCINE AGE 73-64 HIGH RISK  07/07/2020 (Originally 09/02/1968)  . HEMOGLOBIN A1C  09/21/2019  . FOOT EXAM  03/03/2020  . OPHTHALMOLOGY EXAM  03/11/2020  . TETANUS/TDAP  06/27/2029  . INFLUENZA VACCINE  Completed    ----------------------------------------------------------------------------------------------------------------------------------------------------------------------------------------------------------------- Physical Exam BP 118/74   Pulse 64   Temp 98.6 F (37 C) (Temporal)   Ht 5\' 10"  (1.778 m)   Wt 199 lb 12.8 oz (90.6 kg)   SpO2 98%   BMI 28.67 kg/m   Physical Exam Constitutional:      Appearance: Normal appearance.  HENT:     Head: Normocephalic and atraumatic.     Mouth/Throat:     Mouth: Mucous membranes are moist.  Eyes:     General: No scleral icterus. Cardiovascular:     Rate and Rhythm: Normal rate  and regular rhythm.     Pulses: Normal pulses.  Pulmonary:     Effort: Pulmonary effort is normal.     Breath sounds: Normal breath sounds.  Musculoskeletal:     Right lower leg: No edema.     Left lower leg: No edema.  Skin:    General: Skin is warm and dry.  Neurological:     General: No focal deficit present.  Psychiatric:        Mood and Affect: Mood normal.        Behavior: Behavior normal.   . Diabetic Foot Exam - Simple   Simple Foot Form Diabetic Foot exam was performed with the following findings: Yes 07/11/2019 11:30 AM  Visual Inspection No deformities, no ulcerations, no other skin breakdown bilaterally: Yes Sensation Testing Intact to touch and monofilament testing bilaterally: Yes Pulse Check Posterior Tibialis and Dorsalis pulse intact bilaterally: Yes Comments      ------------------------------------------------------------------------------------------------------------------------------------------------------------------------------------------------------------------- Assessment and Plan  Type 2 diabetes mellitus without complication, without long-term current use of insulin (HCC) -Update a1c and will make adjustments to medications based on results.  -Encouraged to follow low carb diet.  -Work on incorporating increased exercise again once sciatica is improving.   Return in about 3 months (around 10/08/2019) for DM.   Disturbance of skin sensation Check b12 and tsh

## 2019-07-08 NOTE — Patient Instructions (Signed)
We'll be in touch with lab work results and medication recommendations.  I have entered orders for colonoscopy- you should be contacted to arrange an appt  Continue to work on increasing your activity level again.

## 2019-07-11 ENCOUNTER — Encounter: Payer: Self-pay | Admitting: Family Medicine

## 2019-07-11 ENCOUNTER — Telehealth: Payer: Self-pay

## 2019-07-11 NOTE — Assessment & Plan Note (Signed)
-  Update a1c and will make adjustments to medications based on results.  -Encouraged to follow low carb diet.  -Work on incorporating increased exercise again once sciatica is improving.   Return in about 3 months (around 10/08/2019) for DM.

## 2019-07-11 NOTE — Telephone Encounter (Signed)
Result note sent via mychart:   Your a1c has improved. I would recommend that you continue current medications and follow a low carb diet. Cholesterol levels are well controlled. Your B12 and thyroid function were normal. Please let me know if you have questions regarding these lab results.

## 2019-07-11 NOTE — Assessment & Plan Note (Signed)
Check b12 and tsh

## 2019-07-11 NOTE — Telephone Encounter (Signed)
Copied from [place] [id number]. Topic: General - Inquiry >> Jul 11, 2019 11:13 AM Virl Axe D wrote: Reason for CRM: Pt called regarding lab results from 07/08/19. Requesting CB.

## 2019-07-15 ENCOUNTER — Ambulatory Visit: Payer: Self-pay

## 2019-07-15 ENCOUNTER — Other Ambulatory Visit: Payer: Self-pay

## 2019-07-15 ENCOUNTER — Ambulatory Visit (INDEPENDENT_AMBULATORY_CARE_PROVIDER_SITE_OTHER): Payer: Medicare Other | Admitting: Physical Medicine and Rehabilitation

## 2019-07-15 VITALS — BP 126/78 | HR 73

## 2019-07-15 DIAGNOSIS — M5116 Intervertebral disc disorders with radiculopathy, lumbar region: Secondary | ICD-10-CM

## 2019-07-15 MED ORDER — BETAMETHASONE SOD PHOS & ACET 6 (3-3) MG/ML IJ SUSP
12.0000 mg | Freq: Once | INTRAMUSCULAR | Status: AC
  Administered 2019-07-15: 10:00:00 12 mg

## 2019-07-15 NOTE — Progress Notes (Signed)
 .  Numeric Pain Rating Scale and Functional Assessment Average Pain 6   In the last MONTH (on 0-10 scale) has pain interfered with the following?  1. General activity like being  able to carry out your everyday physical activities such as walking, climbing stairs, carrying groceries, or moving a chair?  Rating(7)   +Driver, -BT, -Dye Allergies.  

## 2019-07-17 ENCOUNTER — Encounter: Payer: Self-pay | Admitting: Surgery

## 2019-07-17 ENCOUNTER — Other Ambulatory Visit: Payer: Self-pay

## 2019-07-17 ENCOUNTER — Ambulatory Visit (INDEPENDENT_AMBULATORY_CARE_PROVIDER_SITE_OTHER): Payer: Medicare Other | Admitting: Surgery

## 2019-07-17 ENCOUNTER — Ambulatory Visit: Payer: Self-pay

## 2019-07-17 DIAGNOSIS — M25521 Pain in right elbow: Secondary | ICD-10-CM | POA: Diagnosis not present

## 2019-07-17 DIAGNOSIS — G5692 Unspecified mononeuropathy of left upper limb: Secondary | ICD-10-CM

## 2019-07-17 DIAGNOSIS — G5691 Unspecified mononeuropathy of right upper limb: Secondary | ICD-10-CM | POA: Diagnosis not present

## 2019-07-17 DIAGNOSIS — M25522 Pain in left elbow: Secondary | ICD-10-CM

## 2019-07-17 NOTE — Progress Notes (Signed)
Office Visit Note   Patient: Glenn Robertson           Date of Birth: 1966/03/06           MRN: [id number] Visit Date: 07/17/2019              Requested by: Luetta Nutting, DO [place],  [place] [id number] PCP: Luetta Nutting, DO   Assessment & Plan: Visit Diagnoses:  1. Bilateral elbow joint pain   2. Upper extremity neuropathy, right   3. Neuropathy of left upper extremity     Plan: Patient's ongoing and worsening complaint and previous surgery and I recommend getting NCV/EMG studies to rule out bilateral ulnar nerve entrapment at the elbow.  Patient to follow-up with Dr. Lorin Mercy after completion of the study to discuss results.  Follow-Up Instructions: Return in about 4 weeks (around 08/14/2019) for With Dr. Lorin Mercy to review bilateral NCV/EMG studies upper extremities.   Orders:  Orders Placed This Encounter  Procedures  . XR Cervical Spine 2 or 3 views  . Ambulatory referral to Neurology   No orders of the defined types were placed in this encounter.     Procedures: No procedures performed   Clinical Data: No additional findings.   Subjective: No chief complaint on file.   HPI 53 year old black male comes in today with complaints of bilateral medial elbow pain and bilateral upper extremity numbness and tingling.  Patient states that he is status post bilateral elbow ulnar nerve decompression with question of anterior transposition by Dr. Theodis Sato in 2012.  Physician has since retired.  States that over the last 6 months or so he has been having worsening numbness and tingling down the ulnar aspect of his forearms into his hands.  States that this is worse when he uses his iPad and has his elbows flexed to about 90 degrees.  No specific complaints of neck pain.  He did have an MRI of the cervical spine in 2013 which was unremarkable.  Study was done at that time due to similar problems that he has today. Review of Systems No current cardiac  pulmonary GI GU issues  Objective: Vital Signs: There were no vitals taken for this visit.  Physical Exam HENT:     Head: Normocephalic and atraumatic.  Eyes:     Extraocular Movements: Extraocular movements intact.     Pupils: Pupils are equal, round, and reactive to light.  Neck:     Musculoskeletal: Normal range of motion.     Comments: No brachial plexus or trapezius tenderness. Pulmonary:     Effort: No respiratory distress.  Musculoskeletal:     Comments: Final elbows he has good range of motion.  He does have surgical scars from previous surgery.  Positive Tinel's over the bilateral cubital tunnels.  Negative elbow flexion test today.  Negative Tinel's over the bilateral carpal tunnel.  No focal motor deficits.  Neurological:     General: No focal deficit present.     Mental Status: He is alert and oriented to person, place, and time.     Ortho Exam  Specialty Comments:  No specialty comments available.  Imaging: No results found.   PMFS History: Patient Active Problem List   Diagnosis Date Noted  . Schizoaffective disorder, depressive type ([place]) 11/27/2018  . Urinary hesitancy 10/08/2018  . Erectile dysfunction 09/24/2018  . Transient alteration of awareness 08/08/2018  . Tendinopathy of right gluteus medius 05/22/2018  . Pain of right hip joint 05/08/2018  .  Hearing difficulty 04/13/2018  . Trochanteric bursitis, right hip 03/23/2018  . Asthma 03/16/2018  . Headache 03/02/2018  . Type 2 diabetes mellitus without complication, without long-term current use of insulin ([place]) 02/16/2018  . Screening for colon cancer 02/16/2018  . Pain in left leg 10/28/2016  . Lumbosacral radiculopathy at S1 03/15/2016  . Lumbar degenerative disc disease 12/18/2015  . Disc displacement, lumbar 04/21/2015  . Chondromalacia of both patellae 02/24/2015  . Paranoid schizophrenia, chronic condition ([place]) 02/24/2015  . Right knee pain 07/18/2014  . Right ankle pain 01/27/2014  .  Pain in joint, ankle and foot 01/27/2014  . Sciatica 01/10/2012  . Disturbance of skin sensation 11/14/2011   Past Medical History:  Diagnosis Date  . Allergy   . Anxiety   . Asthma   . Chronic pain   . Depression   . Diabetes mellitus   . Neuromuscular disorder ([place])     Family History  Problem Relation Age of Onset  . Diabetes Father   . Diabetes Paternal Uncle     Past Surgical History:  Procedure Laterality Date  . ANKLE ARTHROSCOPY    . Arm Surgery  1/12   rt ulnar nerve decompression  . EPIDURAL BLOCK INJECTION     several  . ULNAR NERVE TRANSPOSITION  01/19/2012   Procedure: ULNAR NERVE DECOMPRESSION/TRANSPOSITION;  Surgeon: Cammie Sickle., MD;  Location: [place];  Service: Orthopedics;  Laterality: Left;  ulnar nerve decompression vs transposition left elbow   Social History   Occupational History  . Occupation: Unemlpoyed-disabled  Tobacco Use  . Smoking status: Never Smoker  . Smokeless tobacco: Never Used  Substance and Sexual Activity  . Alcohol use: No  . Drug use: No  . Sexual activity: Not on file

## 2019-08-07 ENCOUNTER — Encounter: Payer: Self-pay | Admitting: Gastroenterology

## 2019-08-12 ENCOUNTER — Telehealth (HOSPITAL_COMMUNITY): Payer: Self-pay | Admitting: *Deleted

## 2019-08-12 ENCOUNTER — Ambulatory Visit (INDEPENDENT_AMBULATORY_CARE_PROVIDER_SITE_OTHER): Payer: Medicare Other | Admitting: Psychology

## 2019-08-12 DIAGNOSIS — F4310 Post-traumatic stress disorder, unspecified: Secondary | ICD-10-CM | POA: Diagnosis not present

## 2019-08-12 DIAGNOSIS — F411 Generalized anxiety disorder: Secondary | ICD-10-CM | POA: Diagnosis not present

## 2019-08-12 DIAGNOSIS — F331 Major depressive disorder, recurrent, moderate: Secondary | ICD-10-CM | POA: Diagnosis not present

## 2019-08-12 DIAGNOSIS — F200 Paranoid schizophrenia: Secondary | ICD-10-CM

## 2019-08-12 NOTE — Telephone Encounter (Signed)
Pt left message stating that he is feeling "more paranoid" for the last two months. Wanting MD advise. Pt has upcoming appointment on 09/04/19. Please review and advise. Pt compliant with medications.

## 2019-08-12 NOTE — Telephone Encounter (Signed)
He is on a maximum dose of Vraylar so if he is becoming increasingly paranoid (paranoia has never really resolved) we may need to change his medication again in which case he needs an earlier appointment if he does not want to wit till early January.

## 2019-08-13 ENCOUNTER — Encounter: Payer: Self-pay | Admitting: Physical Medicine and Rehabilitation

## 2019-08-13 NOTE — Telephone Encounter (Signed)
Sounds good although I doubt we will ever be able to fully suppress his paranoia (has been present for decades).

## 2019-08-15 ENCOUNTER — Ambulatory Visit (INDEPENDENT_AMBULATORY_CARE_PROVIDER_SITE_OTHER): Payer: Medicare Other | Admitting: Psychiatry

## 2019-08-15 ENCOUNTER — Other Ambulatory Visit: Payer: Self-pay

## 2019-08-15 DIAGNOSIS — F200 Paranoid schizophrenia: Secondary | ICD-10-CM | POA: Diagnosis not present

## 2019-08-15 DIAGNOSIS — R404 Transient alteration of awareness: Secondary | ICD-10-CM

## 2019-08-15 DIAGNOSIS — F251 Schizoaffective disorder, depressive type: Secondary | ICD-10-CM

## 2019-08-15 MED ORDER — FLUPHENAZINE HCL 5 MG PO TABS: 5.0000 mg | ORAL_TABLET | Freq: Every day | ORAL | 0 refills | Status: DC

## 2019-08-15 MED ORDER — CLONAZEPAM 1 MG PO TABS: 1.0000 mg | ORAL_TABLET | Freq: Three times a day (TID) | ORAL | 2 refills | Status: DC | PRN

## 2019-08-15 NOTE — Progress Notes (Signed)
BH MD/PA/NP OP Progress Note  08/15/2019 3:19 PM Glenn Robertson  MRN:  [id number] Interview was conducted by phone and I verified that I was speaking with the correct person using two identifiers. I discussed the limitations of evaluation and management by telemedicine and  the availability of in person appointments. Patient expressed understanding and agreed to proceed.  Chief Complaint: Increased paranoia, anxiety.  HPI: 53 yo singleAAMwith past dx of schizophrenia paranoid type vsschizoaffective disorder. Patient has been seen in this office exactly a year ago by Dr. Germaine Pomfret but then followed with Dr. Rosine Door. Not much changed in his history from the original H&P by Dr. Daron Offer. Patient has no hx of suicidal attempts but admits to hx of  on and off SI, he has a long hx of depression and excessive worrying as well as paranoid and grandiose delusions. He expressed havingsome bizarre perceptual disturbances e.g. that he suddenly stops hearing someone who he is talking to or the volume of conversation gradually decreases.He also reports brief, infrequent periods that he loses track of time. He has seen a neurologist for this in the past but "they did not find anything".We continuedclonazepam prn anxiety and insomnia. Dc [name] (no benefit) and insteadaddedVraylarfor paranoia.Heappeared less paranoid though still anxious (takes clonazepam  bid) initially but over past few weeks fears of other people following him increased. He now is afraid to eave his house. Denies having AH. His sleepisfair but interrupted (has to go to the bathroom). Appetiteisgood. He is very apprehensive about taking medications which may cause weight gain or increase blood sugar. He would not consider clozapine for that reason.  Visit Diagnosis:    ICD-10-CM   1. Paranoid schizophrenia, chronic condition ([place])  F20.0   2. Schizoaffective disorder, depressive type (HCC)  F25.1 clonazePAM (KLONOPIN) 1 MG tablet  3.  Transient alteration of awareness  R40.4 clonazePAM (KLONOPIN) 1 MG tablet    Past Psychiatric History: Please see intake H&P.  Past Medical History:  Past Medical History:  Diagnosis Date  . Allergy   . Anxiety   . Asthma   . Chronic pain   . Depression   . Diabetes mellitus   . Neuromuscular disorder [facility])     Past Surgical History:  Procedure Laterality Date  . ANKLE ARTHROSCOPY    . Arm Surgery  1/12   rt ulnar nerve decompression  . EPIDURAL BLOCK INJECTION     several  . ULNAR NERVE TRANSPOSITION  01/19/2012   Procedure: ULNAR NERVE DECOMPRESSION/TRANSPOSITION;  Surgeon: Cammie Sickle., MD;  Location: [place];  Service: Orthopedics;  Laterality: Left;  ulnar nerve decompression vs transposition left elbow    Family Psychiatric History: None.  Family History:  Family History  Problem Relation Age of Onset  . Diabetes Father   . Diabetes Paternal Uncle     Social History:  Social History   Socioeconomic History  . Marital status: Single    Spouse name: Not on file  . Number of children: 0  . Years of education: BA  . Highest education level: Not on file  Occupational History  . Occupation: Unemlpoyed-disabled  Tobacco Use  . Smoking status: Never Smoker  . Smokeless tobacco: Never Used  Substance and Sexual Activity  . Alcohol use: No  . Drug use: No  . Sexual activity: Not on file  Other Topics Concern  . Not on file  Social History Narrative   Lives with mother   Caffeine use: Coke very rare  Right handed    Social Determinants of Health   Financial Resource Strain:   . Difficulty of Paying Living Expenses: Not on file  Food Insecurity:   . Worried About Charity fundraiser in the Last Year: Not on file  . Ran Out of Food in the Last Year: Not on file  Transportation Needs:   . Lack of Transportation (Medical): Not on file  . Lack of Transportation (Non-Medical): Not on file  Physical Activity:   . Days of Exercise  per Week: Not on file  . Minutes of Exercise per Session: Not on file  Stress:   . Feeling of Stress : Not on file  Social Connections:   . Frequency of Communication with Friends and Family: Not on file  . Frequency of Social Gatherings with Friends and Family: Not on file  . Attends Religious Services: Not on file  . Active Member of Clubs or Organizations: Not on file  . Attends Archivist Meetings: Not on file  . Marital Status: Not on file    Allergies:  Allergies  Allergen Reactions  . Citric Acid Other (See Comments)    Runny nose, eyes water  . Food     Oranges or anything with citric acid    Metabolic Disorder Labs: Lab Results  Component Value Date   HGBA1C 7.1 (H) 07/08/2019   MPG 123 03/02/2018   No results found for: PROLACTIN Lab Results  Component Value Date   CHOL 124 07/08/2019   TRIG 106.0 07/08/2019   HDL 52.20 07/08/2019   CHOLHDL 2 07/08/2019   VLDL 21.2 07/08/2019   LDLCALC 50 07/08/2019   LDLCALC 55 09/24/2018   Lab Results  Component Value Date   TSH 1.02 07/08/2019   TSH 0.92 03/15/2018    Therapeutic Level Labs: No results found for: LITHIUM No results found for: VALPROATE No components found for:  CBMZ  Current Medications: Current Outpatient Medications  Medication Sig Dispense Refill  . ACCU-CHEK AVIVA PLUS test strip TEST THREE TIMES DAILY 100 strip 2  . ACCU-CHEK SOFTCLIX LANCETS lancets 3 (three) times daily. for testing  0  . albuterol (PROVENTIL HFA;VENTOLIN HFA) 108 (90 Base) MCG/ACT inhaler Inhale 2 puffs into the lungs as needed. 1 Inhaler 2  . ASPIRIN 81 PO Take 1 tablet by mouth daily.    . Cariprazine HCl 6 MG CAPS Take 1 capsule (6 mg total) by mouth daily. 30 capsule 2  . clonazePAM (KLONOPIN) 1 MG tablet Take 1 tablet (1 mg total) by mouth 3 (three) times daily as needed for anxiety. TAKE 1 TABLET(1 MG) BY MOUTH TWICE DAILY AS NEEDED 90 tablet 2  . empagliflozin (JARDIANCE) 25 MG TABS tablet Take 25 mg by  mouth daily. 30 tablet 6  . fluPHENAZine (PROLIXIN) 5 MG tablet Take 1 tablet (5 mg total) by mouth at bedtime. 30 tablet 0  . fluticasone (FLONASE) 50 MCG/ACT nasal spray Place 2 sprays into both nostrils daily. 16 g 6  . glipiZIDE (GLUCOTROL) 10 MG tablet TAKE 1 TABLET(10 MG) BY MOUTH TWICE DAILY 180 tablet 3  . LUMIGAN 0.01 % SOLN   0  . Meloxicam (MOBIC PO) Take 1 Dose by mouth as needed. FOR HIP PAIN    . metFORMIN (GLUCOPHAGE-XR) 500 MG 24 hr tablet Take 4 tablets (2,000 mg total) by mouth daily with breakfast. 360 tablet 1  . montelukast (SINGULAIR) 10 MG tablet TAKE 1 TABLET(10 MG) BY MOUTH AT BEDTIME 90 tablet 3  .  sertraline (ZOLOFT) 100 MG tablet Take 1 tablet (100 mg total) by mouth daily. 30 tablet 2  . simvastatin (ZOCOR) 20 MG tablet TAKE 1 TABLET(20 MG) BY MOUTH EVERY EVENING 90 tablet 3  . tadalafil (CIALIS) 20 MG tablet TAKE ONE-HALF TO ONE TABLET BY MOUTH EVERY OTHER DAY AS NEEDED FOR ERECTILE DYSFUNCTION 10 tablet 3  . tamsulosin (FLOMAX) 0.4 MG CAPS capsule TK 1 C PO Q NIGHT    . traZODone (DESYREL) 100 MG tablet Take 1 tablet (100 mg total) by mouth at bedtime as needed for sleep. 30 tablet 1   No current facility-administered medications for this visit.     Psychiatric Specialty Exam: Review of Systems  Psychiatric/Behavioral: The patient is nervous/anxious.   All other systems reviewed and are negative.   There were no vitals taken for this visit.There is no height or weight on file to calculate BMI.  General Appearance: NA  Eye Contact:  NA  Speech:  Clear and Coherent and Normal Rate  Volume:  Normal  Mood:  Anxious  Affect:  NA  Thought Process:  Goal Directed and Linear  Orientation:  Full (Time, Place, and Person)  Thought Content: Paranoid Ideation   Suicidal Thoughts:  No  Homicidal Thoughts:  No  Memory:  Immediate;   Fair Recent;   Fair Remote;   Fair  Judgement:  Fair  Insight:  Shallow  Psychomotor Activity:  NA  Concentration:   Concentration: Fair  Recall:  [place] of Knowledge: Fair  Language: Good  Akathisia:  Negative  Handed:  Right  AIMS (if indicated): not done  Assets:  Communication Skills Desire for Improvement Financial Resources/Insurance Housing Resilience  ADL's:  Intact  Cognition: WNL  Sleep:  Fair   Screenings: PHQ2-9     N[facility] Visit from 10/28/2016 in [place] [facility]18[facility]2016 in [place] Office Visit from 03/02/2016 in [place]  PHQ-2 Total Score  0  0  0  0  0       Assessment and Plan: 53 yo singleAAMwith past dx of schizophrenia paranoid type vsschizoaffective disorder. Patient has been seen in this office exactly a year ago by Dr. Germaine Pomfret but then followed with Dr. Rosine Door. Not much changed in his history from the original H&P by Dr. Daron Offer. Patient has no hx of suicidal attempts but admits to hx of  on and off SI, he has a long hx of depression and excessive worrying as well as paranoid and grandiose delusions. He expressed havingsome bizarre perceptual disturbances e.g. that he suddenly stops hearing someone who he is talking to or the volume of conversation gradually decreases.He also reports brief, infrequent periods that he loses track of time. He has seen a neurologist for this in the past but "they did not find anything".We continuedclonazepam prn anxiety and insomnia. Dc [name] (no benefit) and insteadaddedVraylarfor paranoia.Heappeared less paranoid though still anxious (takes clonazepam  bid) initially but over past few weeks fears of other people following him increased. He now is afraid to eave his house. Denies having AH. His sleepisfair but interrupted (has to go to the bathroom). Appetiteisgood. He is very apprehensive about taking medications  which may cause weight gain or increase blood sugar. He would not consider clozapine for that reason.  Dx: Paranoid schizophrenia vs Schizoaffective disorder depressed type  Plan: Continue Zoloft 100 mg,Vraylar6mg , increase Klonopin 1 mg to tidand continue trazodone 100 mg at HS for sleep.I will add Prolixin 5 mg at HS in hope that it may help with paranoia. Next appointment in 3 weeks. The plan was discussed with patient who had an opportunity to ask questions and these were all answered.I spend 25 min in phone consultation with the patient.    Stephanie Acre, MD 08/15/2019, 3:19 PM

## 2019-08-16 ENCOUNTER — Ambulatory Visit: Payer: Medicare Other | Admitting: Orthopaedic Surgery

## 2019-08-19 ENCOUNTER — Other Ambulatory Visit: Payer: Self-pay | Admitting: Family Medicine

## 2019-08-21 ENCOUNTER — Other Ambulatory Visit: Payer: Self-pay

## 2019-08-21 ENCOUNTER — Ambulatory Visit (AMBULATORY_SURGERY_CENTER): Payer: Medicare Other

## 2019-08-21 VITALS — Temp 98.0°F | Ht 70.0 in | Wt 201.2 lb

## 2019-08-21 DIAGNOSIS — Z1211 Encounter for screening for malignant neoplasm of colon: Secondary | ICD-10-CM

## 2019-08-21 DIAGNOSIS — Z1159 Encounter for screening for other viral diseases: Secondary | ICD-10-CM

## 2019-08-21 MED ORDER — NA SULFATE-K SULFATE-MG SULF 17.5-3.13-1.6 GM/177ML PO SOLN: 1.0000 | Freq: Once | ORAL | 0 refills | Status: AC

## 2019-08-21 NOTE — Progress Notes (Signed)

## 2019-08-26 NOTE — Addendum Note (Signed)
Addended by: Steva Ready on: 08/26/2019 03:31 PM   Modules accepted: Orders

## 2019-08-27 ENCOUNTER — Ambulatory Visit (INDEPENDENT_AMBULATORY_CARE_PROVIDER_SITE_OTHER): Payer: Medicare Other | Admitting: Neurology

## 2019-08-27 ENCOUNTER — Encounter (INDEPENDENT_AMBULATORY_CARE_PROVIDER_SITE_OTHER): Payer: Medicare Other | Admitting: Neurology

## 2019-08-27 ENCOUNTER — Other Ambulatory Visit: Payer: Self-pay

## 2019-08-27 DIAGNOSIS — R200 Anesthesia of skin: Secondary | ICD-10-CM | POA: Diagnosis not present

## 2019-08-27 DIAGNOSIS — M79601 Pain in right arm: Secondary | ICD-10-CM | POA: Diagnosis not present

## 2019-08-27 DIAGNOSIS — M79602 Pain in left arm: Secondary | ICD-10-CM | POA: Diagnosis not present

## 2019-08-27 DIAGNOSIS — G5621 Lesion of ulnar nerve, right upper limb: Secondary | ICD-10-CM

## 2019-08-27 DIAGNOSIS — Z0289 Encounter for other administrative examinations: Secondary | ICD-10-CM

## 2019-08-27 NOTE — Progress Notes (Signed)
Full Name: Glenn Robertson Gender: Male MRN #: [id number] Date of Birth: 01-30-1966    Visit Date: 08/27/2019 07:11 Age: 53 Years Examining Physician: Arlice Colt, MD  Referring Physician: Irine Seal, PA    History:  [name] is a 53 year old man with right greater than left arm pain and numbness.  Symptoms are mostly from the elbow to the hand into the fourth and fifth fingers.  Symptoms occur spontaneously on the right but worse when he is holding something or certain positions.  On the left symptoms usually only occur when he is holding something.  On exam, strength was 4+/5 in the intrinsic hand muscles on the right.  Nerve conduction studies: The right ulnar motor response had a normal distal latency and forearm conduction but was slowed across the elbow.  Amplitudes were preserved.  Bilateral median and the left ulnar motor responses had normal distal latencies, amplitudes and conduction velocities.  Bilateral median and ulnar sensory responses had normal peak latencies and amplitudes.  Ulnar F-wave responses were normal though there was some asymmetry, slower on the right.  Electromyography: Needle EMG of selected muscles of both arms was performed.  In the left arm, there was mild chronic denervation in the biceps. There were were some polyphasic motor units in the deltoid though recruitment was normal.  Other muscles of the arm had normal motor unit morphology and recruitment.  There was no spontaneous activity.  In the right arm, there were some polyphasic motor units in 2 of the ulnar innervated hand muscles though recruitment was normal.  Other muscles in the arms had normal motor unit morphology and recruitment.  There was no abnormal spontaneous activity.   Impression: This NCV/EMG study shows the following: 1.   Mild ulnar neuropathy across the right elbow.  This is consistent with symptoms in that arm.  This finding is consistent with symptoms in the right  arm. 2.   There were no definite radiculopathies noted though a minimal left C5 or C6 chronic radiculopathy cannot be ruled out.   Glenn Robertson A. Glenn Shelling, MD, PhD, FAAN Certified in Neurology, Clinical Neurophysiology, Sleep Medicine, Pain Medicine and Neuroimaging Director, [facility] [address] [place], [place] [address] [phone]          [facility]    Nerve / Sites Muscle Latency Ref. Amplitude Ref. Rel Amp Segments Distance Velocity Ref. Area    ms ms mV mV %  cm m/s m/s mVms  R Median - APB     Wrist APB 3.6 ?4.4 8.7 ?4.0 100 Wrist - APB 7   31.3     Upper arm APB 8.0  8.5  97.5 Upper arm - Wrist 25 56 ?49 28.9  L Median - APB     Wrist APB 3.5 ?4.4 8.1 ?4.0 100 Wrist - APB 7   30.9     Upper arm APB 7.8  7.9  96.9 Upper arm - Wrist 25 58 ?49 28.9  R Ulnar - ADM     Wrist ADM 2.9 ?3.3 11.4 ?6.0 100 Wrist - ADM 7   32.3     B.Elbow ADM 7.0  10.6  93 B.Elbow - Wrist 23 56 ?49 30.9     A.Elbow ADM 9.6  10.2  96.2 A.Elbow - B.Elbow 10 38 ?49 30.1         A.Elbow - Wrist      L Ulnar -  ADM     Wrist ADM 2.7 ?3.3 11.7 ?6.0 100 Wrist - ADM 7   37.2     B.Elbow ADM 6.7  10.0  85.4 B.Elbow - Wrist 22 55 ?49 34.5     A.Elbow ADM 8.6  9.9  98.8 A.Elbow - B.Elbow 10 52 ?49 34.9         A.Elbow - Wrist                 SNC    Nerve / Sites Rec. Site Peak Lat Ref.  Amp Ref. Segments Distance Peak Diff Ref.    ms ms V V  cm ms ms  R Median, Ulnar - Transcarpal comparison     Median Palm Wrist 1.6 ?2.2 44 ?35 Median Palm - Wrist 8       Ulnar Palm Wrist 2.2 ?2.2 19 ?12 Ulnar Palm - Wrist 8          Median Palm - Ulnar Palm  -0.6 ?0.4  L Median, Ulnar - Transcarpal comparison     Median Palm Wrist 1.8 ?2.2 47 ?35 Median Palm - Wrist 8       Ulnar Palm Wrist 2.1 ?2.2 18 ?12 Ulnar Palm - Wrist 8          Median Palm - Ulnar Palm  -0.3 ?0.4  R Median - Orthodromic (Dig II, Mid palm)     Dig II  Wrist 2.8 ?3.4 10 ?10 Dig II - Wrist 13    L Median - Orthodromic (Dig II, Mid palm)     Dig II Wrist 2.8 ?3.4 11 ?10 Dig II - Wrist 13    R Ulnar - Orthodromic, (Dig V, Mid palm)     Dig V Wrist 3.1 ?3.1 6 ?5 Dig V - Wrist 11    L Ulnar - Orthodromic, (Dig V, Mid palm)     Dig V Wrist 3.0 ?3.1 8 ?5 Dig V - Wrist 13                   F  Wave    Nerve F Lat Ref.   ms ms  R Ulnar - ADM 31.0 ?32.0  L Ulnar - ADM 27.8 ?32.0         EMG Summary Table    Spontaneous MUAP Recruitment  Muscle IA Fib PSW Fasc Other Amp Dur. Poly Pattern  L. First dorsal interosseous Normal None None None _______ Normal Normal Normal Normal  L. Deltoid Normal None None None _______ Normal Normal 1+ Normal  L. Triceps brachii Normal None None None _______ Normal Normal Normal Normal  L. Biceps brachii Normal None None None _______ Increased Normal 1+ Reduced  L. Extensor digitorum communis Normal None None None _______ Normal Normal Normal Normal  R. Deltoid Normal None None None _______ Normal Normal Normal Normal  R. Triceps brachii Normal None None None _______ Normal Normal Normal Normal  R. Biceps brachii Normal None None None _______ Normal Normal Normal Normal  R. Extensor digitorum communis Normal None None None _______ Normal Normal Normal Normal  R. First dorsal interosseous Normal None None None _______ Normal Normal 1+ Normal  R. Abductor pollicis brevis Normal None None None _______ Normal Normal Normal Normal  R. Abductor digiti minimi (manus) Normal None None None _______ Normal Normal 1+ Normal

## 2019-09-02 ENCOUNTER — Other Ambulatory Visit: Payer: Self-pay | Admitting: Gastroenterology

## 2019-09-02 ENCOUNTER — Ambulatory Visit (INDEPENDENT_AMBULATORY_CARE_PROVIDER_SITE_OTHER): Payer: Medicare Other

## 2019-09-02 DIAGNOSIS — Z1159 Encounter for screening for other viral diseases: Secondary | ICD-10-CM

## 2019-09-03 ENCOUNTER — Other Ambulatory Visit: Payer: Self-pay

## 2019-09-03 ENCOUNTER — Ambulatory Visit: Payer: Medicare Other | Admitting: Orthopaedic Surgery

## 2019-09-03 ENCOUNTER — Other Ambulatory Visit: Payer: Self-pay | Admitting: Family Medicine

## 2019-09-03 LAB — SARS CORONAVIRUS 2 (TAT 6-24 HRS): SARS Coronavirus 2: NEGATIVE

## 2019-09-04 ENCOUNTER — Other Ambulatory Visit: Payer: Self-pay

## 2019-09-04 ENCOUNTER — Encounter: Payer: Self-pay | Admitting: Gastroenterology

## 2019-09-04 ENCOUNTER — Ambulatory Visit: Payer: Medicare Other | Admitting: Family Medicine

## 2019-09-04 ENCOUNTER — Ambulatory Visit (AMBULATORY_SURGERY_CENTER): Payer: Medicare Other | Admitting: Gastroenterology

## 2019-09-04 ENCOUNTER — Ambulatory Visit (HOSPITAL_COMMUNITY): Payer: Medicare Other | Admitting: Psychiatry

## 2019-09-04 VITALS — BP 105/55 | HR 63 | Temp 98.1°F | Resp 15 | Ht 70.0 in | Wt 201.3 lb

## 2019-09-04 DIAGNOSIS — Z1211 Encounter for screening for malignant neoplasm of colon: Secondary | ICD-10-CM

## 2019-09-04 MED ORDER — SODIUM CHLORIDE 0.9 % IV SOLN: 500.0000 mL | Freq: Once | INTRAVENOUS | Status: DC

## 2019-09-04 NOTE — Progress Notes (Signed)
PT taken to PACU. Monitors in place. VSS. Report given to RN. 

## 2019-09-04 NOTE — Op Note (Signed)
[facility] Patient Name: Glenn Robertson Procedure Date: 09/04/2019 2:43 PM MRN: [id number] Endoscopist: Milus Banister , MD Age: 54 Referring MD:  Date of Birth: Jul 21, 1966 Gender: Male Account #: [id number] Procedure:                Colonoscopy Indications:              Screening for colorectal malignant neoplasm Medicines:                Monitored Anesthesia Care Procedure:                Pre-Anesthesia Assessment:                           - Prior to the procedure, a History and Physical                            was performed, and patient medications and                            allergies were reviewed. The patient's tolerance of                            previous anesthesia was also reviewed. The risks                            and benefits of the procedure and the sedation                            options and risks were discussed with the patient.                            All questions were answered, and informed consent                            was obtained. Prior Anticoagulants: The patient has                            taken no previous anticoagulant or antiplatelet                            agents. ASA Grade Assessment: II - A patient with                            mild systemic disease. After reviewing the risks                            and benefits, the patient was deemed in                            satisfactory condition to undergo the procedure.                           After obtaining informed consent, the colonoscope  was passed under direct vision. Throughout the                            procedure, the patient's blood pressure, pulse, and                            oxygen saturations were monitored continuously. The                            Colonoscope was introduced through the anus and                            advanced to the the cecum, identified by                            appendiceal orifice and  ileocecal valve. The                            colonoscopy was performed without difficulty. The                            patient tolerated the procedure well. The quality                            of the bowel preparation was good. The ileocecal                            valve, appendiceal orifice, and rectum were                            photographed. Scope In: 2:46:50 PM Scope Out: 3:04:21 PM Scope Withdrawal Time: 0 hours 15 minutes 6 seconds  Total Procedure Duration: 0 hours 17 minutes 31 seconds  Findings:                 The entire examined colon appeared normal on direct                            and retroflexion views. Complications:            No immediate complications. Estimated blood loss:                            None. Estimated Blood Loss:     Estimated blood loss: none. Impression:               - The entire examined colon is normal on direct and                            retroflexion views.                           - No polyps or cancers. Recommendation:           - Patient has a contact number available for  emergencies. The signs and symptoms of potential                            delayed complications were discussed with the                            patient. Return to normal activities tomorrow.                            Written discharge instructions were provided to the                            patient.                           - Resume previous diet.                           - Continue present medications.                           - Repeat colonoscopy in 10 years for screening. Milus Banister, MD 09/04/2019 3:10:53 PM This report has been signed electronically.

## 2019-09-04 NOTE — Progress Notes (Signed)
Temp  JR  VS  KA  Pt's states no medical or surgical changes since previsit or office visit.

## 2019-09-04 NOTE — Patient Instructions (Signed)
YOU HAD AN ENDOSCOPIC PROCEDURE TODAY AT THE [facility]:   Refer to the procedure report that was given to you for any specific questions about what was found during the examination.  If the procedure report does not answer your questions, please call your gastroenterologist to clarify.  If you requested that your care partner not be given the details of your procedure findings, then the procedure report has been included in a sealed envelope for you to review at your convenience later.  YOU SHOULD EXPECT: Some feelings of bloating in the abdomen. Passage of more gas than usual.  Walking can help get rid of the air that was put into your GI tract during the procedure and reduce the bloating. If you had a lower endoscopy (such as a colonoscopy or flexible sigmoidoscopy) you may notice spotting of blood in your stool or on the toilet paper. If you underwent a bowel prep for your procedure, you may not have a normal bowel movement for a few days.  Please Note:  You might notice some irritation and congestion in your nose or some drainage.  This is from the oxygen used during your procedure.  There is no need for concern and it should clear up in a day or so.  SYMPTOMS TO REPORT IMMEDIATELY:   Following lower endoscopy (colonoscopy or flexible sigmoidoscopy):  Excessive amounts of blood in the stool  Significant tenderness or worsening of abdominal pains  Swelling of the abdomen that is new, acute  Fever of 100F or higher  For urgent or emergent issues, a gastroenterologist can be reached at any hour by calling [phone].   DIET:  We do recommend a small meal at first, but then you may proceed to your regular diet.  Drink plenty of fluids but you should avoid alcoholic beverages for 24 hours.  ACTIVITY:  You should plan to take it easy for the rest of today and you should NOT DRIVE or use heavy machinery until tomorrow (because of the sedation medicines used during the test).     FOLLOW UP: Our staff will call the number listed on your records 48-72 hours following your procedure to check on you and address any questions or concerns that you may have regarding the information given to you following your procedure. If we do not reach you, we will leave a message.  We will attempt to reach you two times.  During this call, we will ask if you have developed any symptoms of COVID 19. If you develop any symptoms (ie: fever, flu-like symptoms, shortness of breath, cough etc.) before then, please call [phone].  If you test positive for Covid 19 in the 2 weeks post procedure, please call and report this information to us.    If any biopsies were taken you will be contacted by phone or by letter within the next 1-3 weeks.  Please call us at [phone] if you have not heard about the biopsies in 3 weeks.    SIGNATURES/CONFIDENTIALITY: You and/or your care partner have signed paperwork which will be entered into your electronic medical record.  These signatures attest to the fact that that the information above on your After Visit Summary has been reviewed and is understood.  Full responsibility of the confidentiality of this discharge information lies with you and/or your care-partner. 

## 2019-09-06 ENCOUNTER — Telehealth: Payer: Self-pay | Admitting: *Deleted

## 2019-09-06 NOTE — Telephone Encounter (Signed)
  Follow up Call-  Call back number 09/04/2019  Post procedure Call Back phone  # [phone]  Permission to leave phone message Yes  Some recent data might be hidden     Patient questions:  Do you have a fever, pain , or abdominal swelling? No. Pain Score  0 *  Have you tolerated food without any problems? Yes.    Have you been able to return to your normal activities? Yes.    Do you have any questions about your discharge instructions: Diet   No. Medications  No. Follow up visit  No.  Do you have questions or concerns about your Care? No.  Actions: * If pain score is 4 or above: No action needed, pain <4.  1. Have you developed a fever since your procedure? no  2.   Have you had an respiratory symptoms (SOB or cough) since your procedure? no  3.   Have you tested positive for COVID 19 since your procedure no  4.   Have you had any family members/close contacts diagnosed with the COVID 19 since your procedure?  no   If yes to any of these questions please route to Joylene John, RN and Alphonsa Gin, Therapist, sports.

## 2019-09-10 ENCOUNTER — Other Ambulatory Visit: Payer: Self-pay

## 2019-09-10 ENCOUNTER — Ambulatory Visit (INDEPENDENT_AMBULATORY_CARE_PROVIDER_SITE_OTHER): Payer: Medicare Other | Admitting: Orthopaedic Surgery

## 2019-09-10 ENCOUNTER — Ambulatory Visit (INDEPENDENT_AMBULATORY_CARE_PROVIDER_SITE_OTHER): Payer: Medicare Other | Admitting: Psychiatry

## 2019-09-10 ENCOUNTER — Encounter: Payer: Self-pay | Admitting: Orthopaedic Surgery

## 2019-09-10 VITALS — Ht 70.0 in | Wt 196.0 lb

## 2019-09-10 DIAGNOSIS — F251 Schizoaffective disorder, depressive type: Secondary | ICD-10-CM

## 2019-09-10 DIAGNOSIS — R200 Anesthesia of skin: Secondary | ICD-10-CM | POA: Diagnosis not present

## 2019-09-10 MED ORDER — TRAZODONE HCL 100 MG PO TABS: 100.0000 mg | ORAL_TABLET | Freq: Every evening | ORAL | 2 refills | Status: DC | PRN

## 2019-09-10 MED ORDER — FLUPHENAZINE HCL 5 MG PO TABS: 5.0000 mg | ORAL_TABLET | Freq: Every day | ORAL | 2 refills | Status: DC

## 2019-09-10 NOTE — Progress Notes (Signed)
BH MD/PA/NP OP Progress Note  09/10/2019 8:42 AM Glenn Robertson  MRN:  [id number] Interview was conducted by phone and I verified that I was speaking with the correct person using two identifiers. I discussed the limitations of evaluation and management by telemedicine and  the availability of in person appointments. Patient expressed understanding and agreed to proceed.  Chief Complaint: Anxiety.  HPI: 54 yo singleAAMwith past dx of schizophrenia paranoid type vsschizoaffective disorder. Patient has been seen in this office exactly a year ago by Dr. Germaine Pomfret but then followed with Dr. Rosine Door. Not much changed in his history from the original H&P by Dr. Daron Offer. Patient has no hx of suicidal attempts but admits tohx ofon and off SI, he has a long hx of depression and excessive worrying as well as paranoid and grandiose delusions. He expressed havingsome bizarre perceptual disturbances e.g. that he suddenly stops hearing someone who he is talking to or the volume of conversation gradually decreases.He also reports brief, infrequent periods that he loses track of time. He has seen a neurologist for this in the past but "they did not find anything".We continuedclonazepam prn anxiety and insomnia. Dc [name] (no benefit) and insteadaddedVraylarforparanoia.Heappeared less paranoid though still anxious (takes clonazepam tid) initially but in early December fears of other people following him increased. He was afraid to leave his house. We have added Prolixin 5 mg at bedtime which he tolerates well and reports decrease of paranoid fears. Denies having AH. His sleepisfair but interrupted(has to go to the bathroom). Appetiteisgood.He is very apprehensive about taking medications which may cause weight gain or increase blood sugar. He would not consider clozapine for that reason.  Visit Diagnosis:    ICD-10-CM   1. Schizoaffective disorder, depressive type ([place])  F25.1     Past Psychiatric  History: Please see intake H&P.  Past Medical History:  Past Medical History:  Diagnosis Date  . Allergy    dog and cats, citrus  . Anxiety   . Asthma   . Chronic pain   . Depression   . Diabetes mellitus   . Glaucoma   . Neuromuscular disorder ([place])   . Paranoid schizophrenia [facility])     Past Surgical History:  Procedure Laterality Date  . ANKLE ARTHROSCOPY    . Arm Surgery  1/12   rt ulnar nerve decompression  . EPIDURAL BLOCK INJECTION     several  . ULNAR NERVE TRANSPOSITION  01/19/2012   Procedure: ULNAR NERVE DECOMPRESSION/TRANSPOSITION;  Surgeon: Cammie Sickle., MD;  Location: [place];  Service: Orthopedics;  Laterality: Left;  ulnar nerve decompression vs transposition left elbow    Family Psychiatric History: None.  Family History:  Family History  Problem Relation Age of Onset  . Diabetes Father   . Diabetes Paternal Uncle   . Colon cancer Neg Hx   . Colon polyps Neg Hx   . Esophageal cancer Neg Hx   . Rectal cancer Neg Hx   . Stomach cancer Neg Hx     Social History:  Social History   Socioeconomic History  . Marital status: Single    Spouse name: Not on file  . Number of children: 0  . Years of education: BA  . Highest education level: Not on file  Occupational History  . Occupation: Unemlpoyed-disabled  Tobacco Use  . Smoking status: Never Smoker  . Smokeless tobacco: Never Used  Substance and Sexual Activity  . Alcohol use: No  . Drug use: No  .  Sexual activity: Not on file  Other Topics Concern  . Not on file  Social History Narrative   Lives with mother   Caffeine use: Coke very rare   Right handed    Social Determinants of Health   Financial Resource Strain:   . Difficulty of Paying Living Expenses: Not on file  Food Insecurity:   . Worried About Charity fundraiser in the Last Year: Not on file  . Ran Out of Food in the Last Year: Not on file  Transportation Needs:   . Lack of Transportation (Medical): Not  on file  . Lack of Transportation (Non-Medical): Not on file  Physical Activity:   . Days of Exercise per Week: Not on file  . Minutes of Exercise per Session: Not on file  Stress:   . Feeling of Stress : Not on file  Social Connections:   . Frequency of Communication with Friends and Family: Not on file  . Frequency of Social Gatherings with Friends and Family: Not on file  . Attends Religious Services: Not on file  . Active Member of Clubs or Organizations: Not on file  . Attends Archivist Meetings: Not on file  . Marital Status: Not on file    Allergies:  Allergies  Allergen Reactions  . Citric Acid Other (See Comments)    Runny nose, eyes water  . Food     Oranges or anything with citric acid    Metabolic Disorder Labs: Lab Results  Component Value Date   HGBA1C 7.1 (H) 07/08/2019   MPG 123 03/02/2018   No results found for: PROLACTIN Lab Results  Component Value Date   CHOL 124 07/08/2019   TRIG 106.0 07/08/2019   HDL 52.20 07/08/2019   CHOLHDL 2 07/08/2019   VLDL 21.2 07/08/2019   LDLCALC 50 07/08/2019   LDLCALC 55 09/24/2018   Lab Results  Component Value Date   TSH 1.02 07/08/2019   TSH 0.92 03/15/2018    Therapeutic Level Labs: No results found for: LITHIUM No results found for: VALPROATE No components found for:  CBMZ  Current Medications: Current Outpatient Medications  Medication Sig Dispense Refill  . ACCU-CHEK AVIVA PLUS test strip TEST THREE TIMES DAILY 100 strip 2  . ACCU-CHEK SOFTCLIX LANCETS lancets 3 (three) times daily. for testing  0  . albuterol (PROVENTIL HFA;VENTOLIN HFA) 108 (90 Base) MCG/ACT inhaler Inhale 2 puffs into the lungs as needed. (Patient not taking: Reported on 09/04/2019) 1 Inhaler 2  . ASPIRIN 81 PO Take 1 tablet by mouth daily.    . Cariprazine HCl 6 MG CAPS Take 1 capsule (6 mg total) by mouth daily. 30 capsule 2  . clonazePAM (KLONOPIN) 1 MG tablet Take 1 tablet (1 mg total) by mouth 3 (three) times daily  as needed for anxiety. TAKE 1 TABLET(1 MG) BY MOUTH TWICE DAILY AS NEEDED 90 tablet 2  . fluPHENAZine (PROLIXIN) 5 MG tablet Take 1 tablet (5 mg total) by mouth at bedtime. 30 tablet 2  . fluticasone (FLONASE) 50 MCG/ACT nasal spray Place 2 sprays into both nostrils daily. 16 g 6  . glipiZIDE (GLUCOTROL) 10 MG tablet TAKE 1 TABLET(10 MG) BY MOUTH TWICE DAILY 180 tablet 3  . JARDIANCE 25 MG TABS tablet TAKE 1 TABLET BY MOUTH DAILY 30 tablet 6  . LUMIGAN 0.01 % SOLN   0  . Meloxicam (MOBIC PO) Take 1 Dose by mouth as needed. FOR HIP PAIN    . metFORMIN (GLUCOPHAGE-XR) 500 MG  24 hr tablet Take 4 tablets (2,000 mg total) by mouth daily with breakfast. 360 tablet 1  . montelukast (SINGULAIR) 10 MG tablet TAKE 1 TABLET(10 MG) BY MOUTH AT BEDTIME 90 tablet 3  . sertraline (ZOLOFT) 100 MG tablet Take 1 tablet (100 mg total) by mouth daily. 30 tablet 2  . simvastatin (ZOCOR) 20 MG tablet TAKE 1 TABLET(20 MG) BY MOUTH EVERY EVENING 90 tablet 3  . tadalafil (CIALIS) 20 MG tablet TAKE ONE-HALF TO ONE TABLET BY MOUTH EVERY OTHER DAY AS NEEDED FOR ERECTILE DYSFUNCTION 5 tablet 3  . tamsulosin (FLOMAX) 0.4 MG CAPS capsule TK 1 C PO Q NIGHT    . [START ON 10/10/2019] traZODone (DESYREL) 100 MG tablet Take 1 tablet (100 mg total) by mouth at bedtime as needed for sleep. 30 tablet 2   No current facility-administered medications for this visit.     Psychiatric Specialty Exam: Review of Systems  Musculoskeletal: Positive for back pain.  Psychiatric/Behavioral: The patient is nervous/anxious.   All other systems reviewed and are negative.   There were no vitals taken for this visit.There is no height or weight on file to calculate BMI.  General Appearance: NA  Eye Contact:  NA  Speech:  Clear and Coherent and Normal Rate  Volume:  Normal  Mood:  Anxious  Affect:  NA  Thought Process:  Goal Directed and Linear  Orientation:  Full (Time, Place, and Person)  Thought Content: Paranoid Ideation and  Rumination   Suicidal Thoughts:  No  Homicidal Thoughts:  No  Memory:  Immediate;   Good Recent;   Good Remote;   Good  Judgement:  Good  Insight:  Fair  Psychomotor Activity:  NA  Concentration:  Concentration: Good  Recall:  Good  Fund of Knowledge: Good  Language: Good  Akathisia:  Negative  Handed:  Right  AIMS (if indicated): not done  Assets:  Communication Skills Desire for Improvement Financial Resources/Insurance Housing Resilience  ADL's:  Intact  Cognition: WNL  Sleep:  Fair   Screenings: PHQ2-9     N[facility]09[facility]2018 [facility] Visit from 10/28[facility]2016 [facility] [place] [facility] 04/18/2016 in [place] Office Visit from 03/02/2016 in [place]  PHQ-2 Total Score  0  0  0  0  0       Assessment and Plan: 54 yo singleAAMwith past dx of schizophrenia paranoid type vsschizoaffective disorder. Patient has been seen in this office exactly a year ago by Dr. Germaine Pomfret but then followed with Dr. Rosine Door. Not much changed in his history from the original H&P by Dr. Daron Offer. Patient has no hx of suicidal attempts but admits tohx ofon and off SI, he has a long hx of depression and excessive worrying as well as paranoid and grandiose delusions. He expressed havingsome bizarre perceptual disturbances e.g. that he suddenly stops hearing someone who he is talking to or the volume of conversation gradually decreases.He also reports brief, infrequent periods that he loses track of time. He has seen a neurologist for this in the past but "they did not find anything".We continuedclonazepam prn anxiety and insomnia. Dc [name] (no benefit) and insteadaddedVraylarforparanoia.Heappeared less paranoid though still anxious (takes clonazepam tid) initially but in early December fears of other people  following him increased. He was afraid to leave his house. We have added Prolixin 5 mg at  bedtime which he tolerates well and reports decrease of paranoid fears. Denies having AH. His sleepisfair but interrupted(has to go to the bathroom). Appetiteisgood.He is very apprehensive about taking medications which may cause weight gain or increase blood sugar. He would not consider clozapine for that reason.  Dx: Paranoid schizophrenia vs Schizoaffective disorder depressed type  Plan: Continue Zoloft 100 mg,Vraylar6mg , Klonopin 1 mg tid, trazodone 100 mg at HS for sleep and fluphenazine 5 mg at HS. Next appointment in 5 weeks. The plan was discussed with patient who had an opportunity to ask questions and these were all answered.I spend 25 min in phone consultation with the patient.    Stephanie Acre, MD 09/10/2019, 8:42 AM

## 2019-09-10 NOTE — Progress Notes (Signed)
Office Visit Note   Patient: Glenn Robertson           Date of Birth: [birth year]           MRN: [id number] Visit Date: 09/10/2019              Requested by: Luetta Nutting, DO [place],  [place] [address] PCP: Luetta Nutting, DO   Assessment & Plan: Visit Diagnoses:  1. Arm numbness     Plan: Electrical tests were reviewed.  No indications for operative intervention at this point.  He may have had some C5 or C6 radiculopathy at some point with improvement.  C-spine x-rays 2 months ago were normal with normal curvature and no disc base narrowing.  Could try to gradually increase his activity levels as he becomes more active.  He can return if he has increased symptoms. Follow-Up Instructions: Return if symptoms worsen or fail to improve.   Orders:  No orders of the defined types were placed in this encounter.  No orders of the defined types were placed in this encounter.     Procedures: No procedures performed   Clinical Data: No additional findings.   Subjective: Chief Complaint  Patient presents with  . Right Hip - Pain  . Left Hip - Pain  . Right Arm - Follow-up    EMG/NCS review  . Left Arm - Follow-up    EMG/NCS Review    HPI 54 year old male returns post EMGs nerve conduction velocities.  This shows mild ulnar neuropathy of the right elbow.  This is a side that he had neurolysis done with Dr. Daylene Katayama prior to 2013.  On the left he has minimal C5 or C6 chronic radiculopathy cannot be ruled out.  Negative changes for ulnar nerve compression in the left elbow and on the left elbow he had an anterior transposition by Dr. Daylene Katayama 2013.  Patient still complains of some pain about his hips he is not been able to resume any jogging.  He is gradually been walking a little bit more increasing the distance.  Review of Systems Positive history of paranoid schizophrenia chronic condition.  Type 2 diabetes not on insulin.  Otherwise unchanged other than  mentioned in HPI.  Objective: Vital Signs: Ht 5\' 10"  (1.778 m)   Wt 196 lb (88.9 kg)   BMI 28.12 kg/m   Physical Exam Constitutional:      Appearance: He is well-developed.  HENT:     Head: Normocephalic and atraumatic.  Eyes:     Pupils: Pupils are equal, round, and reactive to light.  Neck:     Thyroid: No thyromegaly.     Trachea: No tracheal deviation.  Cardiovascular:     Rate and Rhythm: Normal rate.  Pulmonary:     Effort: Pulmonary effort is normal.     Breath sounds: No wheezing.  Abdominal:     General: Bowel sounds are normal.     Palpations: Abdomen is soft.  Skin:    General: Skin is warm and dry.     Capillary Refill: Capillary refill takes less than 2 seconds.  Neurological:     Mental Status: He is alert and oriented to person, place, and time.  Psychiatric:        Behavior: Behavior normal.        Thought Content: Thought content normal.        Judgment: Judgment normal.     Ortho Exam positive Tinel's left elbow anterior to the medial  condyle consistent with ulnar nerve transposition.  Right elbow has a small incision for neurolysis in the cubital tunnel.  No hypothenar atrophy.  Minimal brachial plexus tenderness good rotation of the cervical spine negative [place].  Specialty Comments:  No specialty comments available.  Imaging: No results found.   PMFS History: Patient Active Problem List   Diagnosis Date Noted  . Schizoaffective disorder, depressive type ([place]) 11/27/2018  . Urinary hesitancy 10/08/2018  . Erectile dysfunction 09/24/2018  . Arm numbness 08/20/2018  . Transient alteration of awareness 08/08/2018  . Tendinopathy of right gluteus medius 05/22/2018  . Pain of right hip joint 05/08/2018  . Hearing difficulty 04/13/2018  . Trochanteric bursitis, right hip 03/23/2018  . Asthma 03/16/2018  . Headache 03/02/2018  . Type 2 diabetes mellitus without complication, without long-term current use of insulin ([place]) 02/16/2018  .  Screening for colon cancer 02/16/2018  . Pain in left leg 10/28/2016  . Lumbosacral radiculopathy at S1 03/15/2016  . Lumbar degenerative disc disease 12/18/2015  . Disc displacement, lumbar 04/21/2015  . Chondromalacia of both patellae 02/24/2015  . Paranoid schizophrenia, chronic condition ([place]) 02/24/2015  . Right knee pain 07/18/2014  . Right ankle pain 01/27/2014  . Pain in joint, ankle and foot 01/27/2014  . Sciatica 01/10/2012  . Disturbance of skin sensation 11/14/2011   Past Medical History:  Diagnosis Date  . Allergy    dog and cats, citrus  . Anxiety   . Asthma   . Chronic pain   . Depression   . Diabetes mellitus   . Glaucoma   . Neuromuscular disorder ([place])   . Paranoid schizophrenia ([place])     Family History  Problem Relation Age of Onset  . Diabetes Father   . Diabetes Paternal Uncle   . Colon cancer Neg Hx   . Colon polyps Neg Hx   . Esophageal cancer Neg Hx   . Rectal cancer Neg Hx   . Stomach cancer Neg Hx     Past Surgical History:  Procedure Laterality Date  . ANKLE ARTHROSCOPY    . Arm Surgery  1/12   rt ulnar nerve decompression  . EPIDURAL BLOCK INJECTION     several  . ULNAR NERVE TRANSPOSITION  01/19/2012   Procedure: ULNAR NERVE DECOMPRESSION/TRANSPOSITION;  Surgeon: Cammie Sickle., MD;  Location: [place];  Service: Orthopedics;  Laterality: Left;  ulnar nerve decompression vs transposition left elbow   Social History   Occupational History  . Occupation: Unemlpoyed-disabled  Tobacco Use  . Smoking status: Never Smoker  . Smokeless tobacco: Never Used  Substance and Sexual Activity  . Alcohol use: No  . Drug use: No  . Sexual activity: Not on file

## 2019-09-11 ENCOUNTER — Encounter: Payer: Medicare Other | Admitting: Gastroenterology

## 2019-09-24 ENCOUNTER — Ambulatory Visit (INDEPENDENT_AMBULATORY_CARE_PROVIDER_SITE_OTHER): Payer: Medicare Other | Admitting: Psychology

## 2019-09-24 DIAGNOSIS — F4310 Post-traumatic stress disorder, unspecified: Secondary | ICD-10-CM | POA: Diagnosis not present

## 2019-09-24 DIAGNOSIS — F411 Generalized anxiety disorder: Secondary | ICD-10-CM | POA: Diagnosis not present

## 2019-09-24 DIAGNOSIS — F331 Major depressive disorder, recurrent, moderate: Secondary | ICD-10-CM

## 2019-09-24 DIAGNOSIS — F200 Paranoid schizophrenia: Secondary | ICD-10-CM

## 2019-10-02 ENCOUNTER — Other Ambulatory Visit: Payer: Self-pay | Admitting: Family Medicine

## 2019-10-02 ENCOUNTER — Other Ambulatory Visit (HOSPITAL_COMMUNITY): Payer: Self-pay | Admitting: *Deleted

## 2019-10-02 MED ORDER — SERTRALINE HCL 100 MG PO TABS: 100.0000 mg | ORAL_TABLET | Freq: Every day | ORAL | 2 refills | Status: DC

## 2019-10-07 ENCOUNTER — Other Ambulatory Visit: Payer: Self-pay

## 2019-10-07 ENCOUNTER — Telehealth: Payer: Self-pay | Admitting: Family Medicine

## 2019-10-07 NOTE — Telephone Encounter (Signed)
Pt stated he wants it done 3 times a day due to exercise that he is doing (he needs to make sure that he check 3 times daily).   Pt has an appt with Dr. Elvis Coil tomorrow--he going to talk to Dr. Ethelene Hal about this and hopefully has documentation to provider to medicare. (form at my desk).

## 2019-10-07 NOTE — Telephone Encounter (Signed)
Change rx to 1x/day check

## 2019-10-07 NOTE — Telephone Encounter (Signed)
Received from from [facility] for DM written order for test strips. Medicare does pay for more than 1 times testing if pt is not on insuline. [place] please advise A1C was 7.1 in 07/08/2019.   Please help in absence of Dr. Zigmund Daniel.

## 2019-10-08 ENCOUNTER — Ambulatory Visit (INDEPENDENT_AMBULATORY_CARE_PROVIDER_SITE_OTHER): Payer: Medicare Other

## 2019-10-08 ENCOUNTER — Encounter: Payer: Self-pay | Admitting: Family Medicine

## 2019-10-08 ENCOUNTER — Ambulatory Visit (INDEPENDENT_AMBULATORY_CARE_PROVIDER_SITE_OTHER): Payer: Medicare Other | Admitting: Family Medicine

## 2019-10-08 VITALS — BP 114/70 | HR 69 | Temp 97.3°F | Ht 70.0 in | Wt 204.4 lb

## 2019-10-08 VITALS — BP 102/60 | HR 73 | Temp 97.4°F | Ht 71.0 in | Wt 203.4 lb

## 2019-10-08 DIAGNOSIS — M25561 Pain in right knee: Secondary | ICD-10-CM

## 2019-10-08 DIAGNOSIS — M25551 Pain in right hip: Secondary | ICD-10-CM | POA: Diagnosis not present

## 2019-10-08 DIAGNOSIS — R200 Anesthesia of skin: Secondary | ICD-10-CM

## 2019-10-08 DIAGNOSIS — E7800 Pure hypercholesterolemia, unspecified: Secondary | ICD-10-CM

## 2019-10-08 DIAGNOSIS — Z0001 Encounter for general adult medical examination with abnormal findings: Secondary | ICD-10-CM | POA: Diagnosis not present

## 2019-10-08 DIAGNOSIS — E119 Type 2 diabetes mellitus without complications: Secondary | ICD-10-CM | POA: Diagnosis not present

## 2019-10-08 DIAGNOSIS — Z114 Encounter for screening for human immunodeficiency virus [HIV]: Secondary | ICD-10-CM | POA: Diagnosis not present

## 2019-10-08 DIAGNOSIS — N401 Enlarged prostate with lower urinary tract symptoms: Secondary | ICD-10-CM

## 2019-10-08 DIAGNOSIS — Z0000 Encounter for general adult medical examination without abnormal findings: Secondary | ICD-10-CM

## 2019-10-08 DIAGNOSIS — R351 Nocturia: Secondary | ICD-10-CM | POA: Diagnosis not present

## 2019-10-08 LAB — COMPREHENSIVE METABOLIC PANEL WITH GFR
ALT: 11 U/L (ref 0–53)
AST: 14 U/L (ref 0–37)
Albumin: 4.5 g/dL (ref 3.5–5.2)
Alkaline Phosphatase: 60 U/L (ref 39–117)
BUN: 13 mg/dL (ref 6–23)
CO2: 27 meq/L (ref 19–32)
Calcium: 9.7 mg/dL (ref 8.4–10.5)
Chloride: 104 meq/L (ref 96–112)
Creatinine, Ser: 1.03 mg/dL (ref 0.40–1.50)
GFR: 91.37 mL/min (ref >60.00)
Glucose, Bld: 154 mg/dL — ABNORMAL HIGH (ref 70–99)
Potassium: 3.7 meq/L (ref 3.5–5.1)
Sodium: 136 meq/L (ref 135–145)
Total Bilirubin: 0.6 mg/dL (ref 0.2–1.2)
Total Protein: 6.9 g/dL (ref 6.0–8.3)

## 2019-10-08 LAB — URINALYSIS, ROUTINE W REFLEX MICROSCOPIC
Bilirubin Urine: NEGATIVE
Hgb urine dipstick: NEGATIVE
Ketones, ur: NEGATIVE
Leukocytes,Ua: NEGATIVE
Nitrite: NEGATIVE
RBC / HPF: NONE SEEN (ref 0–?)
Specific Gravity, Urine: 1.01 (ref 1.000–1.030)
Total Protein, Urine: NEGATIVE
Urine Glucose: >=1000 — AB
Urobilinogen, UA: 0.2 (ref 0.0–1.0)
WBC, UA: NONE SEEN (ref 0–?)
pH: 6 (ref 5.0–8.0)

## 2019-10-08 LAB — CBC
HCT: 46 % (ref 39.0–52.0)
Hemoglobin: 15.1 g/dL (ref 13.0–17.0)
MCHC: 32.8 g/dL (ref 30.0–36.0)
MCV: 87.6 fl (ref 78.0–100.0)
Platelets: 177 10*3/uL (ref 150.0–400.0)
RBC: 5.25 Mil/uL (ref 4.22–5.81)
RDW: 12.5 % (ref 11.5–15.5)
WBC: 6.3 10*3/uL (ref 4.0–10.5)

## 2019-10-08 LAB — LDL CHOLESTEROL, DIRECT: Direct LDL: 46 mg/dL

## 2019-10-08 LAB — MICROALBUMIN / CREATININE URINE RATIO
Creatinine,U: 57.5 mg/dL
Microalb Creat Ratio: 1.2 mg/g (ref 0.0–30.0)
Microalb, Ur: <0.7 mg/dL (ref 0.0–1.9)

## 2019-10-08 LAB — PSA: PSA: 0.31 ng/mL (ref 0.10–4.00)

## 2019-10-08 LAB — HEMOGLOBIN A1C: Hgb A1c MFr Bld: 7.6 % — ABNORMAL HIGH (ref 4.6–6.5)

## 2019-10-08 NOTE — Progress Notes (Signed)
Established Patient Office Visit  Subjective:  Patient ID: Glenn Robertson, male    DOB: 1966/04/26  Age: 54 y.o. MRN: [id number]  CC:  Chief Complaint  Patient presents with  . Transitions Of Care    transfer of care from Dr. Zigmund Daniel, pt states that he has right knee pain that come and go.     HPI Glenn Robertson presents for a physical exam and follow-up of his diabetes elevated cholesterol and right knee knee pain.  Claims compliance with his Jardiance, glipizide and Glucotrol for his diabetes diabetes has been in good control.  Testing 3 times daily for his blood sugars once in the morning fasting and before and after he exercises.  It is difficult for him to come in fasting because of his medication schedule needed for treatment of his schizoaffective disorder.  Sees the eye doctor regularly dental care scheduled for next week.  Knee bothers him from time to time and tends to buckle.  Denies prior injury history.  He is currently not taking anything for it.  Reports nocturia but does consume fluids  Past Medical History:  Diagnosis Date  . Allergy    dog and cats, citrus  . Anxiety   . Asthma   . Chronic pain   . Depression   . Diabetes mellitus   . Glaucoma   . Neuromuscular disorder ([place])   . Paranoid schizophrenia [facility])     Past Surgical History:  Procedure Laterality Date  . ANKLE ARTHROSCOPY    . Arm Surgery  1/12   rt ulnar nerve decompression  . EPIDURAL BLOCK INJECTION     several  . ULNAR NERVE TRANSPOSITION  01/19/2012   Procedure: ULNAR NERVE DECOMPRESSION/TRANSPOSITION;  Surgeon: Cammie Sickle., MD;  Location: [place];  Service: Orthopedics;  Laterality: Left;  ulnar nerve decompression vs transposition left elbow    Family History  Problem Relation Age of Onset  . Diabetes Father   . Diabetes Paternal Uncle   . Colon cancer Neg Hx   . Colon polyps Neg Hx   . Esophageal cancer Neg Hx   . Rectal cancer Neg Hx   . Stomach cancer Neg  Hx     Social History   Socioeconomic History  . Marital status: Single    Spouse name: Not on file  . Number of children: 0  . Years of education: BA  . Highest education level: Not on file  Occupational History  . Occupation: Unemlpoyed-disabled  Tobacco Use  . Smoking status: Never Smoker  . Smokeless tobacco: Never Used  Substance and Sexual Activity  . Alcohol use: No  . Drug use: No  . Sexual activity: Not on file  Other Topics Concern  . Not on file  Social History Narrative   Lives with mother   Caffeine use: Coke very rare   Right handed    Social Determinants of Health   Financial Resource Strain:   . Difficulty of Paying Living Expenses: Not on file  Food Insecurity:   . Worried About Charity fundraiser in the Last Year: Not on file  . Ran Out of Food in the Last Year: Not on file  Transportation Needs:   . Lack of Transportation (Medical): Not on file  . Lack of Transportation (Non-Medical): Not on file  Physical Activity:   . Days of Exercise per Week: Not on file  . Minutes of Exercise per Session: Not on file  Stress:   .  Feeling of Stress : Not on file  Social Connections:   . Frequency of Communication with Friends and Family: Not on file  . Frequency of Social Gatherings with Friends and Family: Not on file  . Attends Religious Services: Not on file  . Active Member of Clubs or Organizations: Not on file  . Attends Archivist Meetings: Not on file  . Marital Status: Not on file  Intimate Partner Violence:   . Fear of Current or Ex-Partner: Not on file  . Emotionally Abused: Not on file  . Physically Abused: Not on file  . Sexually Abused: Not on file    Outpatient Medications Prior to Visit  Medication Sig Dispense Refill  . ACCU-CHEK AVIVA PLUS test strip TEST THREE TIMES DAILY 100 strip 5  . ACCU-CHEK SOFTCLIX LANCETS lancets 3 (three) times daily. for testing  0  . albuterol (PROVENTIL HFA;VENTOLIN HFA) 108 (90 Base) MCG/ACT  inhaler Inhale 2 puffs into the lungs as needed. 1 Inhaler 2  . ASPIRIN 81 PO Take 1 tablet by mouth daily.    . Cariprazine HCl 6 MG CAPS Take 1 capsule (6 mg total) by mouth daily. 30 capsule 2  . clonazePAM (KLONOPIN) 1 MG tablet Take 1 tablet (1 mg total) by mouth 3 (three) times daily as needed for anxiety. TAKE 1 TABLET(1 MG) BY MOUTH TWICE DAILY AS NEEDED 90 tablet 2  . fluPHENAZine (PROLIXIN) 5 MG tablet Take 1 tablet (5 mg total) by mouth at bedtime. 30 tablet 2  . fluticasone (FLONASE) 50 MCG/ACT nasal spray Place 2 sprays into both nostrils daily. 16 g 6  . glipiZIDE (GLUCOTROL) 10 MG tablet TAKE 1 TABLET(10 MG) BY MOUTH TWICE DAILY 180 tablet 3  . JARDIANCE 25 MG TABS tablet TAKE 1 TABLET BY MOUTH DAILY 30 tablet 6  . LUMIGAN 0.01 % SOLN   0  . montelukast (SINGULAIR) 10 MG tablet TAKE 1 TABLET(10 MG) BY MOUTH AT BEDTIME 90 tablet 3  . sertraline (ZOLOFT) 100 MG tablet Take 1 tablet (100 mg total) by mouth daily. 30 tablet 2  . simvastatin (ZOCOR) 20 MG tablet TAKE 1 TABLET(20 MG) BY MOUTH EVERY EVENING 90 tablet 3  . tadalafil (CIALIS) 20 MG tablet TAKE ONE-HALF TO ONE TABLET BY MOUTH EVERY OTHER DAY AS NEEDED FOR ERECTILE DYSFUNCTION 5 tablet 3  . [START ON 10/10/2019] traZODone (DESYREL) 100 MG tablet Take 1 tablet (100 mg total) by mouth at bedtime as needed for sleep. 30 tablet 2  . Meloxicam (MOBIC PO) Take 1 Dose by mouth as needed. FOR HIP PAIN    . tamsulosin (FLOMAX) 0.4 MG CAPS capsule TK 1 C PO Q NIGHT    . metFORMIN (GLUCOPHAGE-XR) 500 MG 24 hr tablet Take 4 tablets (2,000 mg total) by mouth daily with breakfast. 360 tablet 1   No facility-administered medications prior to visit.    Allergies  Allergen Reactions  . Citric Acid Other (See Comments)    Runny nose, eyes water  . Food     Oranges or anything with citric acid    ROS Review of Systems  Constitutional: Negative.   HENT: Negative.   Eyes: Negative for photophobia and visual disturbance.  Respiratory:  Negative.   Cardiovascular: Negative.   Gastrointestinal: Negative.   Endocrine: Negative for polyphagia.  Genitourinary: Negative for difficulty urinating, frequency and urgency.  Musculoskeletal: Positive for arthralgias. Negative for gait problem.  Allergic/Immunologic: Negative for immunocompromised state.  Neurological: Negative for light-headedness and headaches.  Hematological:  Does not bruise/bleed easily.      Objective:    Physical Exam  Constitutional: He is oriented to person, place, and time. He appears well-developed and well-nourished. No distress.  HENT:  Head: Normocephalic and atraumatic.  Right Ear: External ear normal.  Left Ear: External ear normal.  Eyes: Conjunctivae are normal. Right eye exhibits no discharge. Left eye exhibits no discharge. No scleral icterus.  Neck: No JVD present. No tracheal deviation present. No thyromegaly present.  Cardiovascular: Normal rate, regular rhythm and normal heart sounds.  Pulmonary/Chest: Effort normal and breath sounds normal. No stridor.  Abdominal: Soft. Bowel sounds are normal. He exhibits no distension. There is no abdominal tenderness. There is no rebound and no guarding.  Genitourinary: Rectum:     Guaiac result negative.     No rectal mass, anal fissure, tenderness, external hemorrhoid, internal hemorrhoid or abnormal anal tone.  Prostate is enlarged. Prostate is not tender.  Musculoskeletal:     Right knee: No swelling or effusion. Normal range of motion. No medial joint line or lateral joint line tenderness.  Lymphadenopathy:    He has no cervical adenopathy.  Neurological: He is alert and oriented to person, place, and time.  Skin: Skin is warm and dry. He is not diaphoretic.  Psychiatric: He has a normal mood and affect. His behavior is normal.    BP 102/60   Pulse 73   Temp (!) 97.4 F (36.3 C) (Tympanic)   Ht 5\' 11"  (1.803 m)   Wt 203 lb 6.4 oz (92.3 kg)   SpO2 96%   BMI 28.37 kg/m  Wt Readings  from Last 3 Encounters:  10/08/19 203 lb 6.4 oz (92.3 kg)  09/10/19 196 lb (88.9 kg)  09/04/19 201 lb 5.1 oz (91.3 kg)     Health Maintenance Due  Topic Date Due  . HIV Screening  09/02/1981    There are no preventive care reminders to display for this patient.  Lab Results  Component Value Date   TSH 1.02 07/08/2019   Lab Results  Component Value Date   WBC 8.9 06/28/2019   HGB 15.4 06/28/2019   HCT 47.5 06/28/2019   MCV 89.0 06/28/2019   PLT 219 06/28/2019   Lab Results  Component Value Date   NA 138 07/08/2019   K 3.9 07/08/2019   CO2 27 07/08/2019   GLUCOSE 107 (H) 07/08/2019   BUN 13 07/08/2019   CREATININE 1.05 07/08/2019   BILITOT 0.4 07/08/2019   ALKPHOS 61 07/08/2019   AST 16 07/08/2019   ALT 15 07/08/2019   PROT 6.8 07/08/2019   ALBUMIN 4.5 07/08/2019   CALCIUM 9.9 07/08/2019   ANIONGAP 9 06/28/2019   GFR 89.45 07/08/2019   Lab Results  Component Value Date   CHOL 124 07/08/2019   Lab Results  Component Value Date   HDL 52.20 07/08/2019   Lab Results  Component Value Date   LDLCALC 50 07/08/2019   Lab Results  Component Value Date   TRIG 106.0 07/08/2019   Lab Results  Component Value Date   CHOLHDL 2 07/08/2019   Lab Results  Component Value Date   HGBA1C 7.1 (H) 07/08/2019      Assessment & Plan:   Problem List Items Addressed This Visit      Endocrine   Type 2 diabetes mellitus without complication, without long-term current use of insulin ([place]) - Primary   Relevant Orders   CBC   Comprehensive metabolic panel   Hemoglobin A1c  Urinalysis, Routine w reflex microscopic   Microalbumin / creatinine urine ratio     Other   Right anterior knee pain   Relevant Orders   Ambulatory referral to Sports Medicine   DG Knee Complete 4 Views Right   Healthcare maintenance   Relevant Orders   PSA   Elevated cholesterol   Relevant Orders   LDL cholesterol, direct   Benign prostatic hyperplasia with nocturia      No orders  of the defined types were placed in this encounter.   Follow-up: Return in about 6 months (around 04/06/2020).      Continue with 3 times daily glucose testing.  Labs are nonfasting due to his complicated schedule.  We will hold fluids 2 hours before going to bed.  Sports medicine referral for knee rule out internal meniscus injury. Libby Maw, MD

## 2019-10-08 NOTE — Patient Instructions (Addendum)
Continue current treatment plan  Follow up as needed   Peripheral Neuropathy Peripheral neuropathy is a type of nerve damage. It affects nerves that carry signals between the spinal cord and the arms, legs, and the rest of the body (peripheral nerves). It does not affect nerves in the spinal cord or brain. In peripheral neuropathy, one nerve or a group of nerves may be damaged. Peripheral neuropathy is a broad category that includes many specific nerve disorders, like diabetic neuropathy, hereditary neuropathy, and carpal tunnel syndrome. What are the causes? This condition may be caused by:  Diabetes. This is the most common cause of peripheral neuropathy.  Nerve injury.  Pressure or stress on a nerve that lasts a long time.  Lack (deficiency) of B vitamins. This can result from alcoholism, poor diet, or a restricted diet.  Infections.  Autoimmune diseases, such as rheumatoid arthritis and systemic lupus erythematosus.  Nerve diseases that are passed from parent to child (inherited).  Some medicines, such as cancer medicines (chemotherapy).  Poisonous (toxic) substances, such as lead and mercury.  Too little blood flowing to the legs.  Kidney disease.  Thyroid disease. In some cases, the cause of this condition is not known. What are the signs or symptoms? Symptoms of this condition depend on which of your nerves is damaged. Common symptoms include:  Loss of feeling (numbness) in the feet, hands, or both.  Tingling in the feet, hands, or both.  Burning pain.  Very sensitive skin.  Weakness.  Not being able to move a part of the body (paralysis).  Muscle twitching.  Clumsiness or poor coordination.  Loss of balance.  Not being able to control your bladder.  Feeling dizzy.  Sexual problems. How is this diagnosed? Diagnosing and finding the cause of peripheral neuropathy can be difficult. Your health care provider will take your medical history and do a  physical exam. A neurological exam will also be done. This involves checking things that are affected by your brain, spinal cord, and nerves (nervous system). For example, your health care provider will check your reflexes, how you move, and what you can feel. You may have other tests, such as:  Blood tests.  Electromyogram (EMG) and nerve conduction tests. These tests check nerve function and how well the nerves are controlling the muscles.  Imaging tests, such as CT scans or MRI to rule out other causes of your symptoms.  Removing a small piece of nerve to be examined in a lab (nerve biopsy). This is rare.  Removing and examining a small amount of the fluid that surrounds the brain and spinal cord (lumbar puncture). This is rare. How is this treated? Treatment for this condition may involve:  Treating the underlying cause of the neuropathy, such as diabetes, kidney disease, or vitamin deficiencies.  Stopping medicines that can cause neuropathy, such as chemotherapy.  Medicine to relieve pain. Medicines may include: ? Prescription or over-the-counter pain medicine. ? Antiseizure medicine. ? Antidepressants. ? Pain-relieving patches that are applied to painful areas of skin.  Surgery to relieve pressure on a nerve or to destroy a nerve that is causing pain.  Physical therapy to help improve movement and balance.  Devices to help you move around (assistive devices). Follow these instructions at home: Medicines  Take over-the-counter and prescription medicines only as told by your health care provider. Do not take any other medicines without first asking your health care provider.  Do not drive or use heavy machinery while taking prescription pain medicine.  Lifestyle   Do not use any products that contain nicotine or tobacco, such as cigarettes and e-cigarettes. Smoking keeps blood from reaching damaged nerves. If you need help quitting, ask your health care provider.  Avoid or  limit alcohol. Too much alcohol can cause a vitamin B deficiency, and vitamin B is needed for healthy nerves.  Eat a healthy diet. This includes: ? Eating foods that are high in fiber, such as fresh fruits and vegetables, whole grains, and beans. ? Limiting foods that are high in fat and processed sugars, such as fried or sweet foods. General instructions   If you have diabetes, work closely with your health care provider to keep your blood sugar under control.  If you have numbness in your feet: ? Check every day for signs of injury or infection. Watch for redness, warmth, and swelling. ? Wear padded socks and comfortable shoes. These help protect your feet.  Develop a good support system. Living with peripheral neuropathy can be stressful. Consider talking with a mental health specialist or joining a support group.  Use assistive devices and attend physical therapy as told by your health care provider. This may include using a walker or a cane.  Keep all follow-up visits as told by your health care provider. This is important. Contact a health care provider if:  You have new signs or symptoms of peripheral neuropathy.  You are struggling emotionally from dealing with peripheral neuropathy.  Your pain is not well-controlled. Get help right away if:  You have an injury or infection that is not healing normally.  You develop new weakness in an arm or leg.  You fall frequently. Summary  Peripheral neuropathy is when the nerves in the arms, or legs are damaged, resulting in numbness, weakness, or pain.  There are many causes of peripheral neuropathy, including diabetes, pinched nerves, vitamin deficiencies, autoimmune disease, and hereditary conditions.  Diagnosing and finding the cause of peripheral neuropathy can be difficult. Your health care provider will take your medical history, do a physical exam, and do tests, including blood tests and nerve function tests.  Treatment  involves treating the underlying cause of the neuropathy and taking medicines to help control pain. Physical therapy and assistive devices may also help. This information is not intended to replace advice given to you by your health care provider. Make sure you discuss any questions you have with your health care provider. Document Revised: 07/28/2017 Document Reviewed: 10/24/2016 Elsevier Patient Education  2020 [employer].

## 2019-10-08 NOTE — Progress Notes (Signed)
PATIENT: Glenn Robertson DOB: 04-Feb-1966  REASON FOR VISIT: follow up HISTORY FROM: patient  Chief Complaint  Patient presents with  . Follow-up    6 mon f/u. Alone. Rm 5. Patient mentioned that he is having right hip pain and the numbness to his left foot his increased.      HISTORY OF PRESENT ILLNESS: Today 10/08/19 Glenn Robertson is a 54 y.o. male here today for follow up transient altered awareness and numbness of extremities. EEG was normal in 09/2018. NCS/EMG showed mild ulnar neuropathy of right arm. He is not having any trouble with numbness of upper extremities. He continues to have low back pain and right hip pain. Pain radiated down the posterior thigh. He does have intermittent numbness of bilateral feet. He is followed by Dr Laurence Spates for pain management. Lumbar spine and hip MRI were unremarkable. He is getting epidural steroid injections that seem to help temporarily. Last injection in 06/2019. He continues close follow up with Dr Montel Culver, psychiatry, for schizophrenia treated with Zoloft, Vraylar, Klonopin, trazodone and Prolixin. He is followed closely by PCP as well. DM is well controlled per patient. Last A1C 7.1.   HISTORY: (copied from my note on 11/19/2018)  Glenn Milleris a 54 y.o.malehere today for follow up for transient alteration of awareness and numbness in bilateral feet.He is doing well and without concerns. He denies episodes of altered awareness since last visit.  He does continue to complain of neuropathic pain.  Left leg and foot is worse but he has numbness in bilateral feet.  He was given a prescription of gabapentin at his last visit but reports that he never picked it up.  He does not feel that this pain is limiting him, however, it is very aggravating.  HISTORY: (copied from Dr Romeo Rabon on 08/20/2018)  I had the pleasure seeing a patient, Glenn Robertson, at [facility] for neurologic consultation regarding his episodes of  transient alteration of awareness, headaches and numbness.  He has hadseveralspells of altered awarenesswhere he feels heloses time over the past couple years. He denies loss of consciousness. There is no generalized tonic-clonic activity and no tongue biting or incontinence. He will have a lapse of seconds but will be non-interactive and won't hear what others are saying. The first episode occurred in 2012. He has had several others, the last one about 2 months ago. He was sitting at the table and felt everything went black but others at the table were not aware of his changed. His mom witnessed an event at a restaurant where he stared blankly for several seconds and had no memory of the event. He does not recall having an EEG.   He also has a problem with headaches on the right. The headaches occur weekly and last much of the day. He may takes an NSAID but it does not always help. He denies nausea or photophobia.   He also reports numbness in his legs. He has a h/o disc bulges in the past. In 2012, he has the onset of numbness in the lateral left foot felt to be due to sciatica or lumbar changes. This year, he has had numbness in both feet. He denies foot weakness. No changes in his gait. He has tenderness in the right lateral hip.   He has paranoid schizophrenia that is doing well. He has been on Latuda.   He snores but has no witnessed OSA. He sometimes dozes off in the evening.   REVIEW OF SYSTEMS:  Out of a complete 14 system review of symptoms, the patient complains only of the following symptoms, right hip pain and all other reviewed systems are negative.   ALLERGIES: Allergies  Allergen Reactions  . Citric Acid Other (See Comments)    Runny nose, eyes water  . Food     Oranges or anything with citric acid    HOME MEDICATIONS: Outpatient Medications Prior to Visit  Medication Sig Dispense Refill  . ACCU-CHEK AVIVA PLUS test  strip TEST THREE TIMES DAILY 100 strip 5  . ACCU-CHEK SOFTCLIX LANCETS lancets 3 (three) times daily. for testing  0  . albuterol (PROVENTIL HFA;VENTOLIN HFA) 108 (90 Base) MCG/ACT inhaler Inhale 2 puffs into the lungs as needed. 1 Inhaler 2  . ASPIRIN 81 PO Take 1 tablet by mouth daily.    . Cariprazine HCl 6 MG CAPS Take 1 capsule (6 mg total) by mouth daily. 30 capsule 2  . clonazePAM (KLONOPIN) 1 MG tablet Take 1 tablet (1 mg total) by mouth 3 (three) times daily as needed for anxiety. TAKE 1 TABLET(1 MG) BY MOUTH TWICE DAILY AS NEEDED 90 tablet 2  . fluPHENAZine (PROLIXIN) 5 MG tablet Take 1 tablet (5 mg total) by mouth at bedtime. 30 tablet 2  . fluticasone (FLONASE) 50 MCG/ACT nasal spray Place 2 sprays into both nostrils daily. 16 g 6  . glipiZIDE (GLUCOTROL) 10 MG tablet TAKE 1 TABLET(10 MG) BY MOUTH TWICE DAILY 180 tablet 3  . JARDIANCE 25 MG TABS tablet TAKE 1 TABLET BY MOUTH DAILY 30 tablet 6  . LUMIGAN 0.01 % SOLN   0  . Meloxicam (MOBIC PO) Take 1 Dose by mouth as needed. FOR HIP PAIN    . montelukast (SINGULAIR) 10 MG tablet TAKE 1 TABLET(10 MG) BY MOUTH AT BEDTIME 90 tablet 3  . sertraline (ZOLOFT) 100 MG tablet Take 1 tablet (100 mg total) by mouth daily. 30 tablet 2  . simvastatin (ZOCOR) 20 MG tablet TAKE 1 TABLET(20 MG) BY MOUTH EVERY EVENING 90 tablet 3  . tadalafil (CIALIS) 20 MG tablet TAKE ONE-HALF TO ONE TABLET BY MOUTH EVERY OTHER DAY AS NEEDED FOR ERECTILE DYSFUNCTION 5 tablet 3  . [START ON 10/10/2019] traZODone (DESYREL) 100 MG tablet Take 1 tablet (100 mg total) by mouth at bedtime as needed for sleep. 30 tablet 2  . tamsulosin (FLOMAX) 0.4 MG CAPS capsule TK 1 C PO Q NIGHT     No facility-administered medications prior to visit.    PAST MEDICAL HISTORY: Past Medical History:  Diagnosis Date  . Allergy    dog and cats, citrus  . Anxiety   . Asthma   . Chronic pain   . Depression   . Diabetes mellitus   . Glaucoma   . Neuromuscular disorder ([place])   .  Paranoid schizophrenia ([place])     PAST SURGICAL HISTORY: Past Surgical History:  Procedure Laterality Date  . ANKLE ARTHROSCOPY    . Arm Surgery  1/12   rt ulnar nerve decompression  . EPIDURAL BLOCK INJECTION     several  . ULNAR NERVE TRANSPOSITION  01/19/2012   Procedure: ULNAR NERVE DECOMPRESSION/TRANSPOSITION;  Surgeon: Cammie Sickle., MD;  Location: [place];  Service: Orthopedics;  Laterality: Left;  ulnar nerve decompression vs transposition left elbow    FAMILY HISTORY: Family History  Problem Relation Age of Onset  . Diabetes Father   . Diabetes Paternal Uncle   . Colon cancer Neg Hx   . Colon  polyps Neg Hx   . Esophageal cancer Neg Hx   . Rectal cancer Neg Hx   . Stomach cancer Neg Hx     SOCIAL HISTORY: Social History   Socioeconomic History  . Marital status: Single    Spouse name: Not on file  . Number of children: 0  . Years of education: BA  . Highest education level: Not on file  Occupational History  . Occupation: Unemlpoyed-disabled  Tobacco Use  . Smoking status: Never Smoker  . Smokeless tobacco: Never Used  Substance and Sexual Activity  . Alcohol use: No  . Drug use: No  . Sexual activity: Not on file  Other Topics Concern  . Not on file  Social History Narrative   Lives with mother   Caffeine use: Coke very rare   Right handed    Social Determinants of Health   Financial Resource Strain:   . Difficulty of Paying Living Expenses: Not on file  Food Insecurity:   . Worried About Charity fundraiser in the Last Year: Not on file  . Ran Out of Food in the Last Year: Not on file  Transportation Needs:   . Lack of Transportation (Medical): Not on file  . Lack of Transportation (Non-Medical): Not on file  Physical Activity:   . Days of Exercise per Week: Not on file  . Minutes of Exercise per Session: Not on file  Stress:   . Feeling of Stress : Not on file  Social Connections:   . Frequency of Communication with  Friends and Family: Not on file  . Frequency of Social Gatherings with Friends and Family: Not on file  . Attends Religious Services: Not on file  . Active Member of Clubs or Organizations: Not on file  . Attends Archivist Meetings: Not on file  . Marital Status: Not on file  Intimate Partner Violence:   . Fear of Current or Ex-Partner: Not on file  . Emotionally Abused: Not on file  . Physically Abused: Not on file  . Sexually Abused: Not on file      PHYSICAL EXAM  Vitals:   10/08/19 1253  BP: 114/70  Pulse: 69  Temp: (!) 97.3 F (36.3 C)  TempSrc: Oral  Weight: 204 lb 6.4 oz (92.7 kg)  Height: 5\' 10"  (1.778 m)   Body mass index is 29.33 kg/m.  Generalized: Well developed, in no acute distress  Cardiology: normal rate and rhythm, no murmur noted Respiratory: clear to auscultation bilaterally  Neurological examination  Mentation: Alert oriented to time, place, history taking. Follows all commands speech and language fluent Cranial nerve II-XII: Pupils were equal round reactive to light. Extraocular movements were full, visual field were full  Motor: The motor testing reveals 5 over 5 strength of all 4 extremities. Good symmetric motor tone is noted throughout.  Sensory: Sensory testing is intact to soft touch and pinprick on all 4 extremities. No evidence of extinction is noted.  Coordination: Cerebellar testing reveals good finger-nose-finger and heel-to-shin bilaterally.  Gait and station: Gait is normal.   DIAGNOSTIC DATA (LABS, IMAGING, TESTING) - I reviewed patient records, labs, notes, testing and imaging myself where available.  No flowsheet data found.   Lab Results  Component Value Date   WBC 8.9 06/28/2019   HGB 15.4 06/28/2019   HCT 47.5 06/28/2019   MCV 89.0 06/28/2019   PLT 219 06/28/2019      Component Value Date/Time   NA 138 07/08/2019 1334  K 3.9 07/08/2019 1334   CL 103 07/08/2019 1334   CO2 27 07/08/2019 1334   GLUCOSE 107  (H) 07/08/2019 1334   BUN 13 07/08/2019 1334   CREATININE 1.05 07/08/2019 1334   CREATININE 1.10 03/02/2018 1451   CALCIUM 9.9 07/08/2019 1334   PROT 6.8 07/08/2019 1334   ALBUMIN 4.5 07/08/2019 1334   AST 16 07/08/2019 1334   ALT 15 07/08/2019 1334   ALKPHOS 61 07/08/2019 1334   BILITOT 0.4 07/08/2019 1334   GFRNONAA >60 06/28/2019 1040   GFRAA >60 06/28/2019 1040   Lab Results  Component Value Date   CHOL 124 07/08/2019   HDL 52.20 07/08/2019   LDLCALC 50 07/08/2019   TRIG 106.0 07/08/2019   CHOLHDL 2 07/08/2019   Lab Results  Component Value Date   HGBA1C 7.1 (H) 07/08/2019   Lab Results  Component Value Date   VITAMINB12 >1500 (H) 07/08/2019   Lab Results  Component Value Date   TSH 1.02 07/08/2019     ASSESSMENT AND PLAN 54 y.o. year old male  has a past medical history of Allergy, Anxiety, Asthma, Chronic pain, Depression, Diabetes mellitus, Glaucoma, Neuromuscular disorder ([place]), and Paranoid schizophrenia ([place]). here with     ICD-10-CM   1. Right hip pain  M25.551   2. Numbness  R20.0     Mr Clemons continues to have intermittent upper and lower extremity numbness, however, has tried Lyrica and gabapentin with minimal relief. He was seen by ortho for concerns of ulnar neuropathy. No surgical intervention recommended at this time. He continues close follow up with Dr Laurence Spates for [facility] of lumbar spine that helps. He is also seeing psychiatry and PCP regularly. He feels that mood and DM is well managed. He had updated labs today with PCP. He was advised to continue current treatment plan. We have discussed the importance of keeping blood sugars under good control to help prevent worsening of numbness. Regular exercise will help.  He will continue to monitor symptoms and follow up as needed.    No orders of the defined types were placed in this encounter.    No orders of the defined types were placed in this encounter.     I spent 25 minutes with the patient.  50% of this time was spent counseling and educating patient on plan of care and medications.    Debbora Presto, FNP-C 10/08/2019, 1:25 PM [facility] [address] [place], [place] [address] [phone]

## 2019-10-08 NOTE — Patient Instructions (Signed)
Health Maintenance, Male Adopting a healthy lifestyle and getting preventive care are important in promoting health and wellness. Ask your health care provider about:  The right schedule for you to have regular tests and exams.  Things you can do on your own to prevent diseases and keep yourself healthy. What should I know about diet, weight, and exercise? Eat a healthy diet   Eat a diet that includes plenty of vegetables, fruits, low-fat dairy products, and lean protein.  Do not eat a lot of foods that are high in solid fats, added sugars, or sodium. Maintain a healthy weight Body mass index (BMI) is a measurement that can be used to identify possible weight problems. It estimates body fat based on height and weight. Your health care provider can help determine your BMI and help you achieve or maintain a healthy weight. Get regular exercise Get regular exercise. This is one of the most important things you can do for your health. Most adults should:  Exercise for at least 150 minutes each week. The exercise should increase your heart rate and make you sweat (moderate-intensity exercise).  Do strengthening exercises at least twice a week. This is in addition to the moderate-intensity exercise.  Spend less time sitting. Even light physical activity can be beneficial. Watch cholesterol and blood lipids Have your blood tested for lipids and cholesterol at 53 years of age, then have this test every 5 years. You may need to have your cholesterol levels checked more often if:  Your lipid or cholesterol levels are high.  You are older than 53 years of age.  You are at high risk for heart disease. What should I know about cancer screening? Many types of cancers can be detected early and may often be prevented. Depending on your health history and family history, you may need to have cancer screening at various ages. This may include screening for:  Colorectal cancer.  Prostate  cancer.  Skin cancer.  Lung cancer. What should I know about heart disease, diabetes, and high blood pressure? Blood pressure and heart disease  High blood pressure causes heart disease and increases the risk of stroke. This is more likely to develop in people who have high blood pressure readings, are of African descent, or are overweight.  Talk with your health care provider about your target blood pressure readings.  Have your blood pressure checked: ? Every 3-5 years if you are 53-39 years of age. ? Every year if you are 40 years old or older.  If you are between the ages of 53 and 75 and are a current or former smoker, ask your health care provider if you should have a one-time screening for abdominal aortic aneurysm (AAA). Diabetes Have regular diabetes screenings. This checks your fasting blood sugar level. Have the screening done:  Once every three years after age 53 if you are at a normal weight and have a low risk for diabetes.  More often and at a younger age if you are overweight or have a high risk for diabetes. What should I know about preventing infection? Hepatitis B If you have a higher risk for hepatitis B, you should be screened for this virus. Talk with your health care provider to find out if you are at risk for hepatitis B infection. Hepatitis C Blood testing is recommended for:  Everyone born from 1945 through 1965.  Anyone with known risk factors for hepatitis C. Sexually transmitted infections (STIs)  You should be screened each year   for STIs, including gonorrhea and chlamydia, if: ? You are sexually active and are younger than 53 years of age. ? You are older than 53 years of age and your health care provider tells you that you are at risk for this type of infection. ? Your sexual activity has changed since you were last screened, and you are at increased risk for chlamydia or gonorrhea. Ask your health care provider if you are at risk.  Ask your  health care provider about whether you are at high risk for HIV. Your health care provider may recommend a prescription medicine to help prevent HIV infection. If you choose to take medicine to prevent HIV, you should first get tested for HIV. You should then be tested every 3 months for as long as you are taking the medicine. Follow these instructions at home: Lifestyle  Do not use any products that contain nicotine or tobacco, such as cigarettes, e-cigarettes, and chewing tobacco. If you need help quitting, ask your health care provider.  Do not use street drugs.  Do not share needles.  Ask your health care provider for help if you need support or information about quitting drugs. Alcohol use  Do not drink alcohol if your health care provider tells you not to drink.  If you drink alcohol: ? Limit how much you have to 0-2 drinks a day. ? Be aware of how much alcohol is in your drink. In the [place] one drink equals one 12 oz bottle of beer (355 mL), one 5 oz glass of wine (148 mL), or one 1 oz glass of hard liquor (44 mL). General instructions  Schedule regular health, dental, and eye exams.  Stay current with your vaccines.  Tell your health care provider if: ? You often feel depressed. ? You have ever been abused or do not feel safe at home. Summary  Adopting a healthy lifestyle and getting preventive care are important in promoting health and wellness.  Follow your health care provider's instructions about healthy diet, exercising, and getting tested or screened for diseases.  Follow your health care provider's instructions on monitoring your cholesterol and blood pressure. This information is not intended to replace advice given to you by your health care provider. Make sure you discuss any questions you have with your health care provider. Document Revised: 08/08/2018 Document Reviewed: 08/08/2018 Elsevier Patient Education  2020 [employer]  Preventive Care 53-64 Years  Old, Male Preventive care refers to lifestyle choices and visits with your health care provider that can promote health and wellness. This includes:  A yearly physical exam. This is also called an annual well check.  Regular dental and eye exams.  Immunizations.  Screening for certain conditions.  Healthy lifestyle choices, such as eating a healthy diet, getting regular exercise, not using drugs or products that contain nicotine and tobacco, and limiting alcohol use. What can I expect for my preventive care visit? Physical exam Your health care provider will check:  Height and weight. These may be used to calculate body mass index (BMI), which is a measurement that tells if you are at a healthy weight.  Heart rate and blood pressure.  Your skin for abnormal spots. Counseling Your health care provider may ask you questions about:  Alcohol, tobacco, and drug use.  Emotional well-being.  Home and relationship well-being.  Sexual activity.  Eating habits.  Work and work environment. What immunizations do I need?  Influenza (flu) vaccine  This is recommended every year. Tetanus, diphtheria,   and pertussis (Tdap) vaccine  You may need a Td booster every 10 years. Varicella (chickenpox) vaccine  You may need this vaccine if you have not already been vaccinated. Zoster (shingles) vaccine  You may need this after age 63. Measles, mumps, and rubella (MMR) vaccine  You may need at least one dose of MMR if you were born in 1957 or later. You may also need a second dose. Pneumococcal conjugate (PCV13) vaccine  You may need this if you have certain conditions and were not previously vaccinated. Pneumococcal polysaccharide (PPSV23) vaccine  You may need one or two doses if you smoke cigarettes or if you have certain conditions. Meningococcal conjugate (MenACWY) vaccine  You may need this if you have certain conditions. Hepatitis A vaccine  You may need this if you have  certain conditions or if you travel or work in places where you may be exposed to hepatitis A. Hepatitis B vaccine  You may need this if you have certain conditions or if you travel or work in places where you may be exposed to hepatitis B. Haemophilus influenzae type b (Hib) vaccine  You may need this if you have certain risk factors. Human papillomavirus (HPV) vaccine  If recommended by your health care provider, you may need three doses over 6 months. You may receive vaccines as individual doses or as more than one vaccine together in one shot (combination vaccines). Talk with your health care provider about the risks and benefits of combination vaccines. What tests do I need? Blood tests  Lipid and cholesterol levels. These may be checked every 5 years, or more frequently if you are over 68 years old.  Hepatitis C test.  Hepatitis B test. Screening  Lung cancer screening. You may have this screening every year starting at age 78 if you have a 30-pack-year history of smoking and currently smoke or have quit within the past 15 years.  Prostate cancer screening. Recommendations will vary depending on your family history and other risks.  Colorectal cancer screening. All adults should have this screening starting at age 38 and continuing until age 22. Your health care provider may recommend screening at age 73 if you are at increased risk. You will have tests every 1-10 years, depending on your results and the type of screening test.  Diabetes screening. This is done by checking your blood sugar (glucose) after you have not eaten for a while (fasting). You may have this done every 1-3 years.  Sexually transmitted disease (STD) testing. Follow these instructions at home: Eating and drinking  Eat a diet that includes fresh fruits and vegetables, whole grains, lean protein, and low-fat dairy products.  Take vitamin and mineral supplements as recommended by your health care  provider.  Do not drink alcohol if your health care provider tells you not to drink.  If you drink alcohol: ? Limit how much you have to 0-2 drinks a day. ? Be aware of how much alcohol is in your drink. In the [place] one drink equals one 12 oz bottle of beer (355 mL), one 5 oz glass of wine (148 mL), or one 1 oz glass of hard liquor (44 mL). Lifestyle  Take daily care of your teeth and gums.  Stay active. Exercise for at least 30 minutes on 5 or more days each week.  Do not use any products that contain nicotine or tobacco, such as cigarettes, e-cigarettes, and chewing tobacco. If you need help quitting, ask your health care provider.  If  you are sexually active, practice safe sex. Use a condom or other form of protection to prevent STIs (sexually transmitted infections).  Talk with your health care provider about taking a low-dose aspirin every day starting at age 42. What's next?  Go to your health care provider once a year for a well check visit.  Ask your health care provider how often you should have your eyes and teeth checked.  Stay up to date on all vaccines. This information is not intended to replace advice given to you by your health care provider. Make sure you discuss any questions you have with your health care provider. Document Revised: 08/09/2018 Document Reviewed: 08/09/2018 Elsevier Patient Education  [place].  Benign Prostatic Hyperplasia  Benign prostatic hyperplasia (BPH) is an enlarged prostate gland that is caused by the normal aging process and not by cancer. The prostate is a walnut-sized gland that is involved in the production of semen. It is located in front of the rectum and below the bladder. The bladder stores urine and the urethra is the tube that carries the urine out of the body. The prostate may get bigger as a man gets older. An enlarged prostate can press on the urethra. This can make it harder to pass urine. The build-up of urine in  the bladder can cause infection. Back pressure and infection may progress to bladder damage and kidney (renal) failure. What are the causes? This condition is part of a normal aging process. However, not all men develop problems from this condition. If the prostate enlarges away from the urethra, urine flow will not be blocked. If it enlarges toward the urethra and compresses it, there will be problems passing urine. What increases the risk? This condition is more likely to develop in men over the age of 27 years. What are the signs or symptoms? Symptoms of this condition include:  Getting up often during the night to urinate.  Needing to urinate frequently during the day.  Difficulty starting urine flow.  Decrease in size and strength of your urine stream.  Leaking (dribbling) after urinating.  Inability to pass urine. This needs immediate treatment.  Inability to completely empty your bladder.  Pain when you pass urine. This is more common if there is also an infection.  Urinary tract infection (UTI). How is this diagnosed? This condition is diagnosed based on your medical history, a physical exam, and your symptoms. Tests will also be done, such as:  A post-void bladder scan. This measures any amount of urine that may remain in your bladder after you finish urinating.  A digital rectal exam. In a rectal exam, your health care provider checks your prostate by putting a lubricated, gloved finger into your rectum to feel the back of your prostate gland. This exam detects the size of your gland and any abnormal lumps or growths.  An exam of your urine (urinalysis).  A prostate specific antigen (PSA) screening. This is a blood test used to screen for prostate cancer.  An ultrasound. This test uses sound waves to electronically produce a picture of your prostate gland. Your health care provider may refer you to a specialist in kidney and prostate diseases (urologist). How is this  treated? Once symptoms begin, your health care provider will monitor your condition (active surveillance or watchful waiting). Treatment for this condition will depend on the severity of your condition. Treatment may include:  Observation and yearly exams. This may be the only treatment needed if your condition  and symptoms are mild.  Medicines to relieve your symptoms, including: ? Medicines to shrink the prostate. ? Medicines to relax the muscle of the prostate.  Surgery in severe cases. Surgery may include: ? Prostatectomy. In this procedure, the prostate tissue is removed completely through an open incision or with a laparoscope or robotics. ? Transurethral resection of the prostate (TURP). In this procedure, a tool is inserted through the opening at the tip of the penis (urethra). It is used to cut away tissue of the inner core of the prostate. The pieces are removed through the same opening of the penis. This removes the blockage. ? Transurethral incision (TUIP). In this procedure, small cuts are made in the prostate. This lessens the prostate's pressure on the urethra. ? Transurethral microwave thermotherapy (TUMT). This procedure uses microwaves to create heat. The heat destroys and removes a small amount of prostate tissue. ? Transurethral needle ablation (TUNA). This procedure uses radio frequencies to destroy and remove a small amount of prostate tissue. ? Interstitial laser coagulation ([place]). This procedure uses a laser to destroy and remove a small amount of prostate tissue. ? Transurethral electrovaporization (TUVP). This procedure uses electrodes to destroy and remove a small amount of prostate tissue. ? Prostatic urethral lift. This procedure inserts an implant to push the lobes of the prostate away from the urethra. Follow these instructions at home:  Take over-the-counter and prescription medicines only as told by your health care provider.  Monitor your symptoms for any  changes. Contact your health care provider with any changes.  Avoid drinking large amounts of liquid before going to bed or out in public.  Avoid or reduce how much caffeine or alcohol you drink.  Give yourself time when you urinate.  Keep all follow-up visits as told by your health care provider. This is important. Contact a health care provider if:  You have unexplained back pain.  Your symptoms do not get better with treatment.  You develop side effects from the medicine you are taking.  Your urine becomes very dark or has a bad smell.  Your lower abdomen becomes distended and you have trouble passing your urine. Get help right away if:  You have a fever or chills.  You suddenly cannot urinate.  You feel lightheaded, or very dizzy, or you faint.  There are large amounts of blood or clots in the urine.  Your urinary problems become hard to manage.  You develop moderate to severe low back or flank pain. The flank is the side of your body between the ribs and the hip. These symptoms may represent a serious problem that is an emergency. Do not wait to see if the symptoms will go away. Get medical help right away. Call your local emergency services (911 in the [place] Do not drive yourself to the hospital. Summary  Benign prostatic hyperplasia (BPH) is an enlarged prostate that is caused by the normal aging process and not by cancer.  An enlarged prostate can press on the urethra. This can make it hard to pass urine.  This condition is part of a normal aging process and is more likely to develop in men over the age of 75 years.  Get help right away if you suddenly cannot urinate. This information is not intended to replace advice given to you by your health care provider. Make sure you discuss any questions you have with your health care provider. Document Revised: 07/10/2018 Document Reviewed: 09/19/2016 Elsevier Patient Education  2020 [employer].

## 2019-10-08 NOTE — Telephone Encounter (Signed)
While I think that it's great that he wants to test 3 times daily. I do not see that he is taking insulin. Please send in supplies for him to test twice daily.

## 2019-10-08 NOTE — Progress Notes (Signed)
I have read the note, and I agree with the clinical assessment and plan.  Flo Berroa A. Nathanel Tallman, MD, PhD, FAAN Certified in Neurology, Clinical Neurophysiology, Sleep Medicine, Pain Medicine and Neuroimaging  [facility] [address], [address] [place], [place] [address] [phone]  

## 2019-10-09 ENCOUNTER — Ambulatory Visit: Payer: Medicare Other | Admitting: Family Medicine

## 2019-10-09 LAB — HIV ANTIBODY (ROUTINE TESTING W REFLEX): HIV 1&2 Ab, 4th Generation: NONREACTIVE

## 2019-10-09 NOTE — Progress Notes (Deleted)
Glenn Robertson - 54 y.o. male MRN [id number]  Date of birth: Dec 23, 1965  SUBJECTIVE:  Including CC & ROS.  No chief complaint on file.   Glenn Robertson is a 54 y.o. male that is  ***.  ***   Review of Systems See HPI   HISTORY: Past Medical, Surgical, Social, and Family History Reviewed & Updated per EMR.   Pertinent Historical Findings include:  Past Medical History:  Diagnosis Date  . Allergy    dog and cats, citrus  . Anxiety   . Asthma   . Chronic pain   . Depression   . Diabetes mellitus   . Glaucoma   . Neuromuscular disorder ([place])   . Paranoid schizophrenia [facility])     Past Surgical History:  Procedure Laterality Date  . ANKLE ARTHROSCOPY    . Arm Surgery  1/12   rt ulnar nerve decompression  . EPIDURAL BLOCK INJECTION     several  . ULNAR NERVE TRANSPOSITION  01/19/2012   Procedure: ULNAR NERVE DECOMPRESSION/TRANSPOSITION;  Surgeon: Cammie Sickle., MD;  Location: [place];  Service: Orthopedics;  Laterality: Left;  ulnar nerve decompression vs transposition left elbow    Family History  Problem Relation Age of Onset  . Diabetes Father   . Diabetes Paternal Uncle   . Colon cancer Neg Hx   . Colon polyps Neg Hx   . Esophageal cancer Neg Hx   . Rectal cancer Neg Hx   . Stomach cancer Neg Hx     Social History   Socioeconomic History  . Marital status: Single    Spouse name: Not on file  . Number of children: 0  . Years of education: BA  . Highest education level: Not on file  Occupational History  . Occupation: Unemlpoyed-disabled  Tobacco Use  . Smoking status: Never Smoker  . Smokeless tobacco: Never Used  Substance and Sexual Activity  . Alcohol use: No  . Drug use: No  . Sexual activity: Not on file  Other Topics Concern  . Not on file  Social History Narrative   Lives with mother   Caffeine use: Coke very rare   Right handed    Social Determinants of Health   Financial Resource Strain:   . Difficulty of Paying  Living Expenses: Not on file  Food Insecurity:   . Worried About Charity fundraiser in the Last Year: Not on file  . Ran Out of Food in the Last Year: Not on file  Transportation Needs:   . Lack of Transportation (Medical): Not on file  . Lack of Transportation (Non-Medical): Not on file  Physical Activity:   . Days of Exercise per Week: Not on file  . Minutes of Exercise per Session: Not on file  Stress:   . Feeling of Stress : Not on file  Social Connections:   . Frequency of Communication with Friends and Family: Not on file  . Frequency of Social Gatherings with Friends and Family: Not on file  . Attends Religious Services: Not on file  . Active Member of Clubs or Organizations: Not on file  . Attends Archivist Meetings: Not on file  . Marital Status: Not on file  Intimate Partner Violence:   . Fear of Current or Ex-Partner: Not on file  . Emotionally Abused: Not on file  . Physically Abused: Not on file  . Sexually Abused: Not on file     PHYSICAL EXAM:  VS: There were no  vitals taken for this visit. Physical Exam Gen: NAD, alert, cooperative with exam, well-appearing MSK:  ***      ASSESSMENT & PLAN:   No problem-specific Assessment & Plan notes found for this encounter.

## 2019-10-11 ENCOUNTER — Ambulatory Visit: Payer: Medicare Other | Admitting: Family Medicine

## 2019-10-11 NOTE — Telephone Encounter (Signed)
Form Faxed--

## 2019-10-15 ENCOUNTER — Other Ambulatory Visit: Payer: Self-pay

## 2019-10-15 ENCOUNTER — Ambulatory Visit (INDEPENDENT_AMBULATORY_CARE_PROVIDER_SITE_OTHER): Payer: Medicare Other | Admitting: Psychiatry

## 2019-10-15 DIAGNOSIS — F251 Schizoaffective disorder, depressive type: Secondary | ICD-10-CM | POA: Diagnosis not present

## 2019-10-15 DIAGNOSIS — R404 Transient alteration of awareness: Secondary | ICD-10-CM

## 2019-10-15 MED ORDER — CLONAZEPAM 1 MG PO TABS: 1.0000 mg | ORAL_TABLET | Freq: Three times a day (TID) | ORAL | 2 refills | Status: DC | PRN

## 2019-10-15 MED ORDER — CARIPRAZINE HCL 6 MG PO CAPS: 6.0000 mg | ORAL_CAPSULE | Freq: Every day | ORAL | 2 refills | Status: DC

## 2019-10-15 NOTE — Progress Notes (Signed)
[place] [facility] MD/PA/NP OP Progress Note  10/15/2019 9:13 AM Glenn Robertson  MRN:  [id number] Interview was conducted by phone and I verified that I was speaking with the correct person using two identifiers. I discussed the limitations of evaluation and management by telemedicine and  the availability of in person appointments. Patient expressed understanding and agreed to proceed.  Chief Complaint: Anxiety, numbness, problems with balance.  HPI: 54 yo singleAAMwith past dx of schizophrenia paranoid type vsschizoaffective disorder. Patient has been seen in this office exactly a year ago by Dr. Germaine Pomfret but then followed with Dr. Rosine Door. Not much changed in his history from the original H&P by Dr. Daron Offer. Patient has no hx of suicidal attempts but admits tohx ofon and off SI, he has a long hx of depression and excessive worrying as well as paranoid and grandiose delusions. He expressed havingsome bizarre perceptual disturbances e.g. that he suddenly stops hearing someone who he is talking to or the volume of conversation gradually decreases.He also reports brief, infrequent periods that he loses track of time. He has seen a neurologist for this in the past but "they did not find anything".We continuedclonazepam prn anxiety and insomnia. Dc [name] (no benefit) and insteadaddedVraylarforparanoia.Heappearedless paranoid though still anxious (takes clonazepam bid or tid depending on a day)initially but in early December fears of other people following him increased. He was afraid to leave his house.We have added Prolixin 5 mg at bedtime which he tolerates well and reports decrease of paranoid fears. Denies having AH. His sleepisfair but interrupted(has to go to the bathroom). Appetiteisgood.He is very apprehensive about taking medications which may cause weight gain or increase blood sugar. He would not consider clozapine for that reason. He focuses on multiple medical complaints: numbness in both  feet and some fingers in his hands, back pain, occasional problems with balance. None of these problems are new but he feels that they worsened recently. He has been seeing neurology for these symptoms likely secondary to peripheral neuropathy. No recent medication changes have been made.  Visit Diagnosis:    ICD-10-CM   1. Schizoaffective disorder, depressive type ([place])  F25.1 clonazePAM (KLONOPIN) 1 MG tablet  2. Transient alteration of awareness  R40.4 clonazePAM (KLONOPIN) 1 MG tablet    Past Psychiatric History: Please see intake H&P.  Past Medical History:  Past Medical History:  Diagnosis Date  . Allergy    dog and cats, citrus  . Anxiety   . Asthma   . Chronic pain   . Depression   . Diabetes mellitus   . Glaucoma   . Neuromuscular disorder ([place])   . Paranoid schizophrenia [facility])     Past Surgical History:  Procedure Laterality Date  . ANKLE ARTHROSCOPY    . Arm Surgery  1/12   rt ulnar nerve decompression  . EPIDURAL BLOCK INJECTION     several  . ULNAR NERVE TRANSPOSITION  01/19/2012   Procedure: ULNAR NERVE DECOMPRESSION/TRANSPOSITION;  Surgeon: Cammie Sickle., MD;  Location: [place];  Service: Orthopedics;  Laterality: Left;  ulnar nerve decompression vs transposition left elbow    Family Psychiatric History: None.  Family History:  Family History  Problem Relation Age of Onset  . Diabetes Father   . Diabetes Paternal Uncle   . Colon cancer Neg Hx   . Colon polyps Neg Hx   . Esophageal cancer Neg Hx   . Rectal cancer Neg Hx   . Stomach cancer Neg Hx     Social History:  Social History   Socioeconomic History  . Marital status: Single    Spouse name: Not on file  . Number of children: 0  . Years of education: BA  . Highest education level: Not on file  Occupational History  . Occupation: Unemlpoyed-disabled  Tobacco Use  . Smoking status: Never Smoker  . Smokeless tobacco: Never Used  Substance and Sexual Activity  .  Alcohol use: No  . Drug use: No  . Sexual activity: Not on file  Other Topics Concern  . Not on file  Social History Narrative   Lives with mother   Caffeine use: Coke very rare   Right handed    Social Determinants of Health   Financial Resource Strain:   . Difficulty of Paying Living Expenses: Not on file  Food Insecurity:   . Worried About Charity fundraiser in the Last Year: Not on file  . Ran Out of Food in the Last Year: Not on file  Transportation Needs:   . Lack of Transportation (Medical): Not on file  . Lack of Transportation (Non-Medical): Not on file  Physical Activity:   . Days of Exercise per Week: Not on file  . Minutes of Exercise per Session: Not on file  Stress:   . Feeling of Stress : Not on file  Social Connections:   . Frequency of Communication with Friends and Family: Not on file  . Frequency of Social Gatherings with Friends and Family: Not on file  . Attends Religious Services: Not on file  . Active Member of Clubs or Organizations: Not on file  . Attends Archivist Meetings: Not on file  . Marital Status: Not on file    Allergies:  Allergies  Allergen Reactions  . Citric Acid Other (See Comments)    Runny nose, eyes water  . Food     Oranges or anything with citric acid    Metabolic Disorder Labs: Lab Results  Component Value Date   HGBA1C 7.6 (H) 10/08/2019   MPG 123 03/02/2018   No results found for: PROLACTIN Lab Results  Component Value Date   CHOL 124 07/08/2019   TRIG 106.0 07/08/2019   HDL 52.20 07/08/2019   CHOLHDL 2 07/08/2019   VLDL 21.2 07/08/2019   LDLCALC 50 07/08/2019   LDLCALC 55 09/24/2018   Lab Results  Component Value Date   TSH 1.02 07/08/2019   TSH 0.92 03/15/2018    Therapeutic Level Labs: No results found for: LITHIUM No results found for: VALPROATE No components found for:  CBMZ  Current Medications: Current Outpatient Medications  Medication Sig Dispense Refill  . ACCU-CHEK AVIVA  PLUS test strip TEST THREE TIMES DAILY 100 strip 5  . ACCU-CHEK SOFTCLIX LANCETS lancets 3 (three) times daily. for testing  0  . albuterol (PROVENTIL HFA;VENTOLIN HFA) 108 (90 Base) MCG/ACT inhaler Inhale 2 puffs into the lungs as needed. 1 Inhaler 2  . ASPIRIN 81 PO Take 1 tablet by mouth daily.    . Cariprazine HCl 6 MG CAPS Take 1 capsule (6 mg total) by mouth daily. 30 capsule 2  . clonazePAM (KLONOPIN) 1 MG tablet Take 1 tablet (1 mg total) by mouth 3 (three) times daily as needed for anxiety. 90 tablet 2  . fluPHENAZine (PROLIXIN) 5 MG tablet Take 1 tablet (5 mg total) by mouth at bedtime. 30 tablet 2  . fluticasone (FLONASE) 50 MCG/ACT nasal spray Place 2 sprays into both nostrils daily. 16 g 6  . glipiZIDE (GLUCOTROL)  10 MG tablet TAKE 1 TABLET(10 MG) BY MOUTH TWICE DAILY 180 tablet 3  . JARDIANCE 25 MG TABS tablet TAKE 1 TABLET BY MOUTH DAILY 30 tablet 6  . LUMIGAN 0.01 % SOLN   0  . Meloxicam (MOBIC PO) Take 1 Dose by mouth as needed. FOR HIP PAIN    . montelukast (SINGULAIR) 10 MG tablet TAKE 1 TABLET(10 MG) BY MOUTH AT BEDTIME 90 tablet 3  . sertraline (ZOLOFT) 100 MG tablet Take 1 tablet (100 mg total) by mouth daily. 30 tablet 2  . simvastatin (ZOCOR) 20 MG tablet TAKE 1 TABLET(20 MG) BY MOUTH EVERY EVENING 90 tablet 3  . tadalafil (CIALIS) 20 MG tablet TAKE ONE-HALF TO ONE TABLET BY MOUTH EVERY OTHER DAY AS NEEDED FOR ERECTILE DYSFUNCTION 5 tablet 3  . traZODone (DESYREL) 100 MG tablet Take 1 tablet (100 mg total) by mouth at bedtime as needed for sleep. 30 tablet 2   No current facility-administered medications for this visit.     Psychiatric Specialty Exam: Review of Systems  Musculoskeletal: Positive for back pain.  Neurological: Positive for numbness.  Psychiatric/Behavioral: The patient is nervous/anxious.   All other systems reviewed and are negative.   There were no vitals taken for this visit.There is no height or weight on file to calculate BMI.  General  Appearance: NA  Eye Contact:  NA  Speech:  Clear and Coherent and Normal Rate  Volume:  Normal  Mood:  Anxious  Affect:  NA  Thought Process:  Goal Directed and Linear  Orientation:  Full (Time, Place, and Person)  Thought Content: Rumination   Suicidal Thoughts:  No  Homicidal Thoughts:  No  Memory:  Immediate;   Good Recent;   Good Remote;   Good  Judgement:  Fair  Insight:  Fair  Psychomotor Activity:  NA  Concentration:  Concentration: Good  Recall:  Good  Fund of Knowledge: Good  Language: Good  Akathisia:  Negative  Handed:  Right  AIMS (if indicated): not done  Assets:  Communication Skills Desire for Improvement Financial Resources/Insurance Housing  ADL's:  Intact  Cognition: WNL  Sleep:  Fair   Screenings: PHQ2-9     Office Visit from 10/08/2019 in [facility] Office Visit from 10/28/2016 in [facility] 04/18/2016 in [facility]  PHQ-2 Total Score  2  0  0  0  0       Assessment and Plan: 54 yo singleAAMwith past dx of schizophrenia paranoid type vsschizoaffective disorder. Patient has been seen in this office exactly a year ago by Dr. Germaine Pomfret but then followed with Dr. Rosine Door. Not much changed in his history from the original H&P by Dr. Daron Offer. Patient has no hx of suicidal attempts but admits tohx ofon and off SI, he has a long hx of depression and excessive worrying as well as paranoid and grandiose delusions. He expressed havingsome bizarre perceptual disturbances e.g. that he suddenly stops hearing someone who he is talking to or the volume of conversation gradually decreases.He also reports brief, infrequent periods that he loses track of time. He has seen a neurologist for this in the past but "they did not find anything".We  continuedclonazepam prn anxiety and insomnia. Dc [name] (no benefit) and insteadaddedVraylarforparanoia.Heappearedless paranoid though still anxious (takes clonazepam bid or tid depending on a day)initially but  in early December fears of other people following him increased. He was afraid to leave his house.We have added Prolixin 5 mg at bedtime which he tolerates well and reports decrease of paranoid fears. Denies having AH. His sleepisfair but interrupted(has to go to the bathroom). Appetiteisgood.He is very apprehensive about taking medications which may cause weight gain or increase blood sugar. He would not consider clozapine for that reason. He focuses on multiple medical complaints: numbness in both feet and some fingers in his hands, back pain, occasional problems with balance. None of these problems are new but he feels that they worsened recently. He has been seeing neurology for these symptoms likely secondary to peripheral neuropathy. No recent medication changes have been made.  [place]:[id number] schizophrenia vsSchizoaffective disorder depressed type  Plan: Continue Zoloft 100 mg,Vraylar6mg ,Klonopin 1 mgtid, trazodone 100 mg at HS for sleep and fluphenazine 5 mg at HS. Next appointment in 6 weeks.The plan was discussed with patient who had an opportunity to ask questions and these were all answered.I spend 25 min in phone consultation with the patient.    Stephanie Acre, MD 10/15/2019, 9:13 AM

## 2019-10-21 ENCOUNTER — Ambulatory Visit (INDEPENDENT_AMBULATORY_CARE_PROVIDER_SITE_OTHER): Payer: Medicare Other | Admitting: Psychology

## 2019-10-21 ENCOUNTER — Other Ambulatory Visit: Payer: Self-pay | Admitting: Family Medicine

## 2019-10-21 DIAGNOSIS — F331 Major depressive disorder, recurrent, moderate: Secondary | ICD-10-CM | POA: Diagnosis not present

## 2019-10-21 DIAGNOSIS — F4310 Post-traumatic stress disorder, unspecified: Secondary | ICD-10-CM | POA: Diagnosis not present

## 2019-10-21 DIAGNOSIS — F411 Generalized anxiety disorder: Secondary | ICD-10-CM | POA: Diagnosis not present

## 2019-10-31 ENCOUNTER — Telehealth: Payer: Medicare Other | Admitting: Family Medicine

## 2019-11-06 ENCOUNTER — Other Ambulatory Visit: Payer: Self-pay

## 2019-11-06 ENCOUNTER — Ambulatory Visit: Payer: Self-pay

## 2019-11-06 ENCOUNTER — Ambulatory Visit (INDEPENDENT_AMBULATORY_CARE_PROVIDER_SITE_OTHER): Payer: Medicare Other | Admitting: Physical Medicine and Rehabilitation

## 2019-11-06 ENCOUNTER — Encounter: Payer: Self-pay | Admitting: Physical Medicine and Rehabilitation

## 2019-11-06 VITALS — BP 117/80 | HR 98

## 2019-11-06 DIAGNOSIS — M5116 Intervertebral disc disorders with radiculopathy, lumbar region: Secondary | ICD-10-CM | POA: Diagnosis not present

## 2019-11-06 MED ORDER — METHYLPREDNISOLONE ACETATE 80 MG/ML IJ SUSP
40.0000 mg | Freq: Once | INTRAMUSCULAR | Status: AC
  Administered 2019-11-06: 40 mg

## 2019-11-06 NOTE — Progress Notes (Signed)
Right lower back and buttock pain. No left-sided pain. Numeric Pain Rating Scale and Functional Assessment Average Pain 3   In the last MONTH (on 0-10 scale) has pain interfered with the following?  1. General activity like being  able to carry out your everyday physical activities such as walking, climbing stairs, carrying groceries, or moving a chair?  Rating(8)   +Driver, -BT, -Dye Allergies.

## 2019-11-07 NOTE — Procedures (Signed)
S1 Lumbosacral Transforaminal Epidural Steroid Injection - Sub-Pedicular Approach with Fluoroscopic Guidance   Patient: Glenn Robertson      Date of Birth: 1966-08-22 MRN: [id number] PCP: Libby Maw, MD      Visit Date: 11/06/2019   Universal Protocol:    Date/Time: 03/11/215:52 AM  Consent Given By: the patient  Position:  PRONE  Additional Comments: Vital signs were monitored before and after the procedure. Patient was prepped and draped in the usual sterile fashion. The correct patient, procedure, and site was verified.   Injection Procedure Details:  Procedure Site One Meds Administered:  Meds ordered this encounter  Medications  . methylPREDNISolone acetate (DEPO-MEDROL) injection 40 mg    Laterality: Right  Location/Site:  S1 Foramen   Needle size: 22 ga.  Needle type: Spinal  Needle Placement: Transforaminal  Findings:   -Comments: Excellent flow of contrast along the nerve and into the epidural space.  Epidurogram: Contrast epidurogram showed no nerve root cut off or restricted flow pattern.  Procedure Details: After squaring off the sacral end-plate to get a true AP view, the C-arm was positioned so that the best possible view of the S1 foramen was visualized. The soft tissues overlying this structure were infiltrated with 2-3 ml. of 1% Lidocaine without Epinephrine.    The spinal needle was inserted toward the target using a "trajectory" view along the fluoroscope beam.  Under AP and lateral visualization, the needle was advanced so it did not puncture dura. Biplanar projections were used to confirm position. Aspiration was confirmed to be negative for CSF and/or blood. A 1-2 ml. volume of Isovue-250 was injected and flow of contrast was noted at each level. Radiographs were obtained for documentation purposes.   After attaining the desired flow of contrast documented above, a 0.5 to 1.0 ml test dose of 0.25% Marcaine was injected into each  respective transforaminal space.  The patient was observed for 90 seconds post injection.  After no sensory deficits were reported, and normal lower extremity motor function was noted,   the above injectate was administered so that equal amounts of the injectate were placed at each foramen (level) into the transforaminal epidural space.   Additional Comments:  The patient tolerated the procedure well Dressing: Band-Aid with 2 x 2 sterile gauze    Post-procedure details: Patient was observed during the procedure. Post-procedure instructions were reviewed.  Patient left the clinic in stable condition.

## 2019-11-07 NOTE — Progress Notes (Signed)
Glenn Robertson - 54 y.o. male MRN [id number]  Date of birth: 05/25/1966  Office Visit Note: Visit Date: 11/06/2019 PCP: Libby Maw, MD Referred by: Libby Maw,*  Subjective: Chief Complaint  Patient presents with  . Lower Back - Pain   HPI: Glenn Robertson is a 54 y.o. male who comes in today For planned right S1 transforaminal epidural injection.  Patient has mainly followed by Dr. Rodell Perna and Benjiman Core, PA-C.  He has had issue with chronic S1 radiculopathy from disc herniation at L5-S1.  He has had some level of pain in the sacroiliac joint as well.  He exercises quite a bit and has been unable to really lift weights recently because of coronavirus but otherwise has been trying to stay active.  He reports worsening right buttock pain and hamstring pain over the last several weeks without specific trauma.  His case is complicated by type 2 diabetes as well as schizoaffective disorder.  He has gotten relief in the past from S1 transforaminal injection and we did decide to complete this today.  The patient has failed conservative care including home exercise, medications, time and activity modification.  This injection will be diagnostic and hopefully therapeutic.  Please see requesting physician notes for further details and justification.   ROS Otherwise per HPI.  Assessment & Plan: Visit Diagnoses:  1. Radiculopathy due to lumbar intervertebral disc disorder     Plan: No additional findings.   Meds & Orders:  Meds ordered this encounter  Medications  . methylPREDNISolone acetate (DEPO-MEDROL) injection 40 mg    Orders Placed This Encounter  Procedures  . XR C-ARM NO REPORT  . Epidural Steroid injection    Follow-up: Return if symptoms worsen or fail to improve.   Procedures: No procedures performed  No notes on file   Clinical History: MRI LUMBAR SPINE WITHOUT CONTRAST  TECHNIQUE: Multiplanar, multisequence MR imaging of the lumbar spine was  performed. No intravenous contrast was administered.  COMPARISON:  02/16/2017  FINDINGS: Segmentation:  Standard.  Alignment:  Physiologic.  Vertebrae:  No fracture, evidence of discitis, or bone lesion.  Conus medullaris and cauda equina: Conus extends to the L1 level. Conus and cauda equina appear normal.  Paraspinal and other soft tissues: No acute paraspinal abnormality.  Other: Mild osteoarthritis of bilateral sacroiliac joints.  Disc levels:  Disc spaces: Degenerative disease with disc height loss at L5-S1.  T12-L1: No significant disc bulge. No evidence of neural foraminal stenosis. No central canal stenosis.  L1-L2: No significant disc bulge. No evidence of neural foraminal stenosis. No central canal stenosis.  L2-L3: Minimal broad-based disc bulge. No evidence of neural foraminal stenosis. No central canal stenosis.  L3-L4: Mild broad-based disc bulge. No evidence of neural foraminal stenosis. No central canal stenosis.  L4-L5: Mild broad-based disc bulge. No evidence of neural foraminal stenosis. No central canal stenosis.  L5-S1: Broad disc protrusion eccentric towards the left. Disc abuts bilateral S1 nerve roots. No evidence of neural foraminal stenosis. No central canal stenosis.  IMPRESSION: 1. At L5-S1 there is a broad disc protrusion eccentric towards the left with the disc abutting bilateral S1 nerve roots.   Electronically Signed   By: Kathreen Devoid   On: 11/25/2017 10:39   He reports that he has never smoked. He has never used smokeless tobacco.  Recent Labs    03/21/19 0803 07/08/19 1334 10/08/19 0922  HGBA1C 7.8* 7.1* 7.6*    Objective:  VS:  HT:    WT:  BMI:     BP:117/80  HR:98bpm  TEMP: ( )  RESP:  Physical Exam  Ortho Exam Imaging: XR C-ARM NO REPORT  Result Date: 11/06/2019 Please see Notes tab for imaging impression.   Past Medical/Family/Surgical/Social History: Medications & Allergies reviewed  per EMR, new medications updated. Patient Active Problem List   Diagnosis Date Noted  . Elevated cholesterol 10/08/2019  . Benign prostatic hyperplasia with nocturia 10/08/2019  . Schizoaffective disorder, depressive type ([place]) 11/27/2018  . Urinary hesitancy 10/08/2018  . Erectile dysfunction 09/24/2018  . Arm numbness 08/20/2018  . Transient alteration of awareness 08/08/2018  . Tendinopathy of right gluteus medius 05/22/2018  . Pain of right hip joint 05/08/2018  . Hearing difficulty 04/13/2018  . Trochanteric bursitis, right hip 03/23/2018  . Asthma 03/16/2018  . Headache 03/02/2018  . Type 2 diabetes mellitus without complication, without long-term current use of insulin ([place]) 02/16/2018  . Healthcare maintenance 02/16/2018  . Pain in left leg 10/28/2016  . Lumbosacral radiculopathy at S1 03/15/2016  . Lumbar degenerative disc disease 12/18/2015  . Disc displacement, lumbar 04/21/2015  . Chondromalacia of both patellae 02/24/2015  . Paranoid schizophrenia, chronic condition ([place]) 02/24/2015  . Right anterior knee pain 07/18/2014  . Right ankle pain 01/27/2014  . Pain in joint, ankle and foot 01/27/2014  . Sciatica 01/10/2012  . Disturbance of skin sensation 11/14/2011   Past Medical History:  Diagnosis Date  . Allergy    dog and cats, citrus  . Anxiety   . Asthma   . Chronic pain   . Depression   . Diabetes mellitus   . Glaucoma   . Neuromuscular disorder ([place])   . Paranoid schizophrenia ([place])    Family History  Problem Relation Age of Onset  . Diabetes Father   . Diabetes Paternal Uncle   . Colon cancer Neg Hx   . Colon polyps Neg Hx   . Esophageal cancer Neg Hx   . Rectal cancer Neg Hx   . Stomach cancer Neg Hx    Past Surgical History:  Procedure Laterality Date  . ANKLE ARTHROSCOPY    . Arm Surgery  1/12   rt ulnar nerve decompression  . EPIDURAL BLOCK INJECTION     several  . ULNAR NERVE TRANSPOSITION  01/19/2012   Procedure: ULNAR NERVE  DECOMPRESSION/TRANSPOSITION;  Surgeon: Cammie Sickle., MD;  Location: [place];  Service: Orthopedics;  Laterality: Left;  ulnar nerve decompression vs transposition left elbow   Social History   Occupational History  . Occupation: Unemlpoyed-disabled  Tobacco Use  . Smoking status: Never Smoker  . Smokeless tobacco: Never Used  Substance and Sexual Activity  . Alcohol use: No  . Drug use: No  . Sexual activity: Not on file

## 2019-11-11 ENCOUNTER — Other Ambulatory Visit: Payer: Self-pay | Admitting: Family Medicine

## 2019-11-12 ENCOUNTER — Other Ambulatory Visit: Payer: Self-pay | Admitting: Family Medicine

## 2019-11-13 ENCOUNTER — Other Ambulatory Visit: Payer: Self-pay

## 2019-11-13 MED ORDER — TADALAFIL 20 MG PO TABS: ORAL_TABLET | ORAL | 4 refills | Status: DC

## 2019-11-18 ENCOUNTER — Ambulatory Visit: Payer: Medicare Other | Admitting: Psychology

## 2019-11-18 NOTE — Procedures (Signed)
S1 Lumbosacral Transforaminal Epidural Steroid Injection - Sub-Pedicular Approach with Fluoroscopic Guidance   Patient: Glenn Robertson      Date of Birth: 1966/02/09 MRN: [id number] PCP: Libby Maw, MD      Visit Date: 07/15/2019   Universal Protocol:    Date/Time: 03/22/216:25 AM  Consent Given By: the patient  Position:  PRONE  Additional Comments: Vital signs were monitored before and after the procedure. Patient was prepped and draped in the usual sterile fashion. The correct patient, procedure, and site was verified.   Injection Procedure Details:  Procedure Site One Meds Administered:  Meds ordered this encounter  Medications  . betamethasone acetate-betamethasone sodium phosphate (CELESTONE) injection 12 mg    Laterality: Bilateral  Location/Site:  S1 Foramen   Needle size: 22 ga.  Needle type: Spinal  Needle Placement: Transforaminal  Findings:   -Comments: Excellent flow of contrast along the nerve and into the epidural space.  Epidurogram: Contrast epidurogram showed no nerve root cut off or restricted flow pattern.  Procedure Details: After squaring off the sacral end-plate to get a true AP view, the C-arm was positioned so that the best possible view of the S1 foramen was visualized. The soft tissues overlying this structure were infiltrated with 2-3 ml. of 1% Lidocaine without Epinephrine.    The spinal needle was inserted toward the target using a "trajectory" view along the fluoroscope beam.  Under AP and lateral visualization, the needle was advanced so it did not puncture dura. Biplanar projections were used to confirm position. Aspiration was confirmed to be negative for CSF and/or blood. A 1-2 ml. volume of Isovue-250 was injected and flow of contrast was noted at each level. Radiographs were obtained for documentation purposes.   After attaining the desired flow of contrast documented above, a 0.5 to 1.0 ml test dose of 0.25% Marcaine  was injected into each respective transforaminal space.  The patient was observed for 90 seconds post injection.  After no sensory deficits were reported, and normal lower extremity motor function was noted,   the above injectate was administered so that equal amounts of the injectate were placed at each foramen (level) into the transforaminal epidural space.   Additional Comments:  The patient tolerated the procedure well Dressing: Band-Aid with 2 x 2 sterile gauze    Post-procedure details: Patient was observed during the procedure. Post-procedure instructions were reviewed.  Patient left the clinic in stable condition.

## 2019-11-18 NOTE — Progress Notes (Signed)
Glenn Robertson - 54 y.o. male MRN [id number]  Date of birth: 1966/05/16  Office Visit Note: Visit Date: 07/15/2019 PCP: Libby Maw, MD Referred by: Luetta Nutting, DO  Subjective: Chief Complaint  Patient presents with  . Lower Back - Pain  . Left Leg - Pain   HPI: Glenn Robertson is a 54 y.o. male who comes in today At the request of Benjiman Core, PA-C for S1 transforaminal epidural steroid injection.  The patient is having mainly low back and left hip and leg pain in the posterior fashion consistent with an S1 radiculopathy.  He has having right-sided symptoms as well just not as prominent.  Patient has had a history of lumbar disc herniation at L5-S1 with degenerative changes.  His history is also complicated by type 2 diabetes.  He is very active and continues to exercise.  ROS Otherwise per HPI.  Assessment & Plan: Visit Diagnoses:  1. Radiculopathy due to lumbar intervertebral disc disorder     Plan: No additional findings.   Meds & Orders:  Meds ordered this encounter  Medications  . betamethasone acetate-betamethasone sodium phosphate (CELESTONE) injection 12 mg    Orders Placed This Encounter  Procedures  . XR C-ARM NO REPORT  . Epidural Steroid injection    Follow-up: Return if symptoms worsen or fail to improve, for Rodell Perna, MD.   Procedures: No procedures performed  S1 Lumbosacral Transforaminal Epidural Steroid Injection - Sub-Pedicular Approach with Fluoroscopic Guidance   Patient: Glenn Robertson      Date of Birth: 09-Nov-1965 MRN: [id number] PCP: Libby Maw, MD      Visit Date: 07/15/2019   Universal Protocol:    Date/Time: 03/22/216:25 AM  Consent Given By: the patient  Position:  PRONE  Additional Comments: Vital signs were monitored before and after the procedure. Patient was prepped and draped in the usual sterile fashion. The correct patient, procedure, and site was verified.   Injection Procedure Details:  Procedure  Site One Meds Administered:  Meds ordered this encounter  Medications  . betamethasone acetate-betamethasone sodium phosphate (CELESTONE) injection 12 mg    Laterality: Bilateral  Location/Site:  S1 Foramen   Needle size: 22 ga.  Needle type: Spinal  Needle Placement: Transforaminal  Findings:   -Comments: Excellent flow of contrast along the nerve and into the epidural space.  Epidurogram: Contrast epidurogram showed no nerve root cut off or restricted flow pattern.  Procedure Details: After squaring off the sacral end-plate to get a true AP view, the C-arm was positioned so that the best possible view of the S1 foramen was visualized. The soft tissues overlying this structure were infiltrated with 2-3 ml. of 1% Lidocaine without Epinephrine.    The spinal needle was inserted toward the target using a "trajectory" view along the fluoroscope beam.  Under AP and lateral visualization, the needle was advanced so it did not puncture dura. Biplanar projections were used to confirm position. Aspiration was confirmed to be negative for CSF and/or blood. A 1-2 ml. volume of Isovue-250 was injected and flow of contrast was noted at each level. Radiographs were obtained for documentation purposes.   After attaining the desired flow of contrast documented above, a 0.5 to 1.0 ml test dose of 0.25% Marcaine was injected into each respective transforaminal space.  The patient was observed for 90 seconds post injection.  After no sensory deficits were reported, and normal lower extremity motor function was noted,   the above injectate was administered so that equal  amounts of the injectate were placed at each foramen (level) into the transforaminal epidural space.   Additional Comments:  The patient tolerated the procedure well Dressing: Band-Aid with 2 x 2 sterile gauze    Post-procedure details: Patient was observed during the procedure. Post-procedure instructions were reviewed.   Patient left the clinic in stable condition.     Clinical History: MRI LUMBAR SPINE WITHOUT CONTRAST  TECHNIQUE: Multiplanar, multisequence MR imaging of the lumbar spine was performed. No intravenous contrast was administered.  COMPARISON:  02/16/2017  FINDINGS: Segmentation:  Standard.  Alignment:  Physiologic.  Vertebrae:  No fracture, evidence of discitis, or bone lesion.  Conus medullaris and cauda equina: Conus extends to the L1 level. Conus and cauda equina appear normal.  Paraspinal and other soft tissues: No acute paraspinal abnormality.  Other: Mild osteoarthritis of bilateral sacroiliac joints.  Disc levels:  Disc spaces: Degenerative disease with disc height loss at L5-S1.  T12-L1: No significant disc bulge. No evidence of neural foraminal stenosis. No central canal stenosis.  L1-L2: No significant disc bulge. No evidence of neural foraminal stenosis. No central canal stenosis.  L2-L3: Minimal broad-based disc bulge. No evidence of neural foraminal stenosis. No central canal stenosis.  L3-L4: Mild broad-based disc bulge. No evidence of neural foraminal stenosis. No central canal stenosis.  L4-L5: Mild broad-based disc bulge. No evidence of neural foraminal stenosis. No central canal stenosis.  L5-S1: Broad disc protrusion eccentric towards the left. Disc abuts bilateral S1 nerve roots. No evidence of neural foraminal stenosis. No central canal stenosis.  IMPRESSION: 1. At L5-S1 there is a broad disc protrusion eccentric towards the left with the disc abutting bilateral S1 nerve roots.   Electronically Signed   By: Kathreen Devoid   On: 11/25/2017 10:39   He reports that he has never smoked. He has never used smokeless tobacco.  Recent Labs    03/21/19 0803 07/08/19 1334 10/08/19 0922  HGBA1C 7.8* 7.1* 7.6*    Objective:  VS:  HT:    WT:   BMI:     BP:126/78  HR:73bpm  TEMP: ( )  RESP:  Physical Exam  Ortho Exam  Imaging: No results found.  Past Medical/Family/Surgical/Social History: Medications & Allergies reviewed per EMR, new medications updated. Patient Active Problem List   Diagnosis Date Noted  . Elevated cholesterol 10/08/2019  . Benign prostatic hyperplasia with nocturia 10/08/2019  . Schizoaffective disorder, depressive type ([place]) 11/27/2018  . Urinary hesitancy 10/08/2018  . Erectile dysfunction 09/24/2018  . Arm numbness 08/20/2018  . Transient alteration of awareness 08/08/2018  . Tendinopathy of right gluteus medius 05/22/2018  . Pain of right hip joint 05/08/2018  . Hearing difficulty 04/13/2018  . Trochanteric bursitis, right hip 03/23/2018  . Asthma 03/16/2018  . Headache 03/02/2018  . Type 2 diabetes mellitus without complication, without long-term current use of insulin ([place]) 02/16/2018  . Healthcare maintenance 02/16/2018  . Pain in left leg 10/28/2016  . Lumbosacral radiculopathy at S1 03/15/2016  . Lumbar degenerative disc disease 12/18/2015  . Disc displacement, lumbar 04/21/2015  . Chondromalacia of both patellae 02/24/2015  . Paranoid schizophrenia, chronic condition ([place]) 02/24/2015  . Right anterior knee pain 07/18/2014  . Right ankle pain 01/27/2014  . Pain in joint, ankle and foot 01/27/2014  . Sciatica 01/10/2012  . Disturbance of skin sensation 11/14/2011   Past Medical History:  Diagnosis Date  . Allergy    dog and cats, citrus  . Anxiety   . Asthma   . Chronic  pain   . Depression   . Diabetes mellitus   . Glaucoma   . Neuromuscular disorder ([place])   . Paranoid schizophrenia ([place])    Family History  Problem Relation Age of Onset  . Diabetes Father   . Diabetes Paternal Uncle   . Colon cancer Neg Hx   . Colon polyps Neg Hx   . Esophageal cancer Neg Hx   . Rectal cancer Neg Hx   . Stomach cancer Neg Hx    Past Surgical History:  Procedure Laterality Date  . ANKLE ARTHROSCOPY    . Arm Surgery  1/12   rt ulnar nerve decompression  .  EPIDURAL BLOCK INJECTION     several  . ULNAR NERVE TRANSPOSITION  01/19/2012   Procedure: ULNAR NERVE DECOMPRESSION/TRANSPOSITION;  Surgeon: Cammie Sickle., MD;  Location: [facility];  Service: Orthopedics;  Laterality: Left;  ulnar nerve decompression vs transposition left elbow   Social History   Occupational History  . Occupation: Unemlpoyed-disabled  Tobacco Use  . Smoking status: Never Smoker  . Smokeless tobacco: Never Used  Substance and Sexual Activity  . Alcohol use: No  . Drug use: No  . Sexual activity: Not on file

## 2019-11-19 DIAGNOSIS — H401231 Low-tension glaucoma, bilateral, mild stage: Secondary | ICD-10-CM | POA: Diagnosis not present

## 2019-11-19 DIAGNOSIS — H04123 Dry eye syndrome of bilateral lacrimal glands: Secondary | ICD-10-CM | POA: Diagnosis not present

## 2019-11-19 DIAGNOSIS — H2513 Age-related nuclear cataract, bilateral: Secondary | ICD-10-CM | POA: Diagnosis not present

## 2019-11-19 DIAGNOSIS — H43813 Vitreous degeneration, bilateral: Secondary | ICD-10-CM | POA: Diagnosis not present

## 2019-12-02 ENCOUNTER — Ambulatory Visit (INDEPENDENT_AMBULATORY_CARE_PROVIDER_SITE_OTHER): Payer: Medicare Other | Admitting: Psychiatry

## 2019-12-02 ENCOUNTER — Other Ambulatory Visit: Payer: Self-pay

## 2019-12-02 DIAGNOSIS — F251 Schizoaffective disorder, depressive type: Secondary | ICD-10-CM | POA: Diagnosis not present

## 2019-12-02 MED ORDER — SERTRALINE HCL 100 MG PO TABS: 100.0000 mg | ORAL_TABLET | Freq: Every day | ORAL | 2 refills | Status: DC

## 2019-12-02 MED ORDER — TRAZODONE HCL 100 MG PO TABS: 100.0000 mg | ORAL_TABLET | Freq: Every evening | ORAL | 2 refills | Status: DC | PRN

## 2019-12-02 MED ORDER — CARIPRAZINE HCL 6 MG PO CAPS: 6.0000 mg | ORAL_CAPSULE | Freq: Every day | ORAL | 2 refills | Status: DC

## 2019-12-02 MED ORDER — FLUPHENAZINE HCL 5 MG PO TABS: 5.0000 mg | ORAL_TABLET | Freq: Every day | ORAL | 2 refills | Status: DC

## 2019-12-02 NOTE — Progress Notes (Signed)
[facility] MD/PA/NP OP Progress Note  12/02/2019 1:44 PM Glenn Robertson  MRN:  [id number] Interview was conducted by phone and I verified that I was speaking with the correct person using two identifiers. I discussed the limitations of evaluation and management by telemedicine and  the availability of in person appointments. Patient expressed understanding and agreed to proceed.  Chief Complaint: Vivid dreams.  HPI: 54yo singleAAMwith past dx of schizophrenia paranoid type vsschizoaffective disorder. Patient has no hx of suicidal attempts but admits tohx ofon and off SI, he has a long hx of depression and excessive worrying as well as paranoid and grandiose delusions. He expressed havingsome bizarre perceptual disturbances e.g. that he suddenly stops hearing someone who he is talking to or the volume of conversation gradually decreases.He also reports brief, infrequent periods that he loses track of time. He has seen a neurologist for this in the past but "they did not find anything".We continuedclonazepam prn anxiety and insomnia. Dc [name] (no benefit) and insteadaddedVraylarforparanoia.Heappearedless paranoid though still anxious (takes clonazepam bid or tid depending on a day)initially butin early Decemberfears of other people following him increased. Hewas afraid toleave his house.We have added Prolixin 5 mg at bedtime which he tolerates well and reports decrease of paranoid fears.Denies having AH. His sleepisfair but interrupted(has to go to the bathroom). Appetiteisgood.He is very apprehensive about taking medications which may cause weight gain or increase blood sugar. He would not consider clozapine for that reason. He focuses on multiple medical complaints: numbness in both feet and some fingers in his hands, back pain, occasional problems with balance. None of these problems are new but he feels that they worsened recently. He has been seeing neurology for these symptoms  likely secondary to peripheral neuropathy. No recent medication changes have been made and the only complaint he has are vivid, sometimes "scary" dreams.   Visit Diagnosis:    ICD-10-CM   1. Schizoaffective disorder, depressive type ([place])  F25.1     Past Psychiatric History: Please see intake H&P.  Past Medical History:  Past Medical History:  Diagnosis Date  . Allergy    dog and cats, citrus  . Anxiety   . Asthma   . Chronic pain   . Depression   . Diabetes mellitus   . Glaucoma   . Neuromuscular disorder ([place])   . Paranoid schizophrenia [facility])     Past Surgical History:  Procedure Laterality Date  . ANKLE ARTHROSCOPY    . Arm Surgery  1/12   rt ulnar nerve decompression  . EPIDURAL BLOCK INJECTION     several  . ULNAR NERVE TRANSPOSITION  01/19/2012   Procedure: ULNAR NERVE DECOMPRESSION/TRANSPOSITION;  Surgeon: Cammie Sickle., MD;  Location: [facility];  Service: Orthopedics;  Laterality: Left;  ulnar nerve decompression vs transposition left elbow    Family Psychiatric History: None.  Family History:  Family History  Problem Relation Age of Onset  . Diabetes Father   . Diabetes Paternal Uncle   . Colon cancer Neg Hx   . Colon polyps Neg Hx   . Esophageal cancer Neg Hx   . Rectal cancer Neg Hx   . Stomach cancer Neg Hx     Social History:  Social History   Socioeconomic History  . Marital status: Single    Spouse name: Not on file  . Number of children: 0  . Years of education: BA  . Highest education level: Not on file  Occupational History  . Occupation: Unemlpoyed-disabled  Tobacco Use  . Smoking status: Never Smoker  . Smokeless tobacco: Never Used  Substance and Sexual Activity  . Alcohol use: No  . Drug use: No  . Sexual activity: Not on file  Other Topics Concern  . Not on file  Social History Narrative   Lives with mother   Caffeine use: Coke very rare   Right handed    Social Determinants of Health   Financial  Resource Strain:   . Difficulty of Paying Living Expenses:   Food Insecurity:   . Worried About Charity fundraiser in the Last Year:   . Arboriculturist in the Last Year:   Transportation Needs:   . Film/video editor (Medical):   Marland Kitchen Lack of Transportation (Non-Medical):   Physical Activity:   . Days of Exercise per Week:   . Minutes of Exercise per Session:   Stress:   . Feeling of Stress :   Social Connections:   . Frequency of Communication with Friends and Family:   . Frequency of Social Gatherings with Friends and Family:   . Attends Religious Services:   . Active Member of Clubs or Organizations:   . Attends Archivist Meetings:   Marland Kitchen Marital Status:     Allergies:  Allergies  Allergen Reactions  . Citric Acid Other (See Comments)    Runny nose, eyes water  . Food     Oranges or anything with citric acid    Metabolic Disorder Labs: Lab Results  Component Value Date   HGBA1C 7.6 (H) 10/08/2019   MPG 123 03/02/2018   No results found for: PROLACTIN Lab Results  Component Value Date   CHOL 124 07/08/2019   TRIG 106.0 07/08/2019   HDL 52.20 07/08/2019   CHOLHDL 2 07/08/2019   VLDL 21.2 07/08/2019   LDLCALC 50 07/08/2019   LDLCALC 55 09/24/2018   Lab Results  Component Value Date   TSH 1.02 07/08/2019   TSH 0.92 03/15/2018    Therapeutic Level Labs: No results found for: LITHIUM No results found for: VALPROATE No components found for:  CBMZ  Current Medications: Current Outpatient Medications  Medication Sig Dispense Refill  . ACCU-CHEK AVIVA PLUS test strip TEST THREE TIMES DAILY 100 strip 5  . ACCU-CHEK SOFTCLIX LANCETS lancets 3 (three) times daily. for testing  0  . albuterol (PROVENTIL HFA;VENTOLIN HFA) 108 (90 Base) MCG/ACT inhaler Inhale 2 puffs into the lungs as needed. 1 Inhaler 2  . ASPIRIN 81 PO Take 1 tablet by mouth daily.    Derrill Memo ON 01/13/2020] Cariprazine HCl 6 MG CAPS Take 1 capsule (6 mg total) by mouth daily. 30  capsule 2  . clonazePAM (KLONOPIN) 1 MG tablet Take 1 tablet (1 mg total) by mouth 3 (three) times daily as needed for anxiety. 90 tablet 2  . [START ON 12/09/2019] fluPHENAZine (PROLIXIN) 5 MG tablet Take 1 tablet (5 mg total) by mouth at bedtime. 30 tablet 2  . fluticasone (FLONASE) 50 MCG/ACT nasal spray SHAKE LIQUID AND USE 2 SPRAYS IN EACH NOSTRIL DAILY 16 g 6  . glipiZIDE (GLUCOTROL) 10 MG tablet TAKE 1 TABLET(10 MG) BY MOUTH TWICE DAILY 180 tablet 3  . JARDIANCE 25 MG TABS tablet TAKE 1 TABLET BY MOUTH DAILY 30 tablet 6  . LUMIGAN 0.01 % SOLN   0  . Meloxicam (MOBIC PO) Take 1 Dose by mouth as needed. FOR HIP PAIN    . metFORMIN (GLUCOPHAGE-XR) 500 MG 24 hr tablet TAKE  4 TABLETS(2000 MG) BY MOUTH DAILY WITH BREAKFAST 360 tablet 1  . montelukast (SINGULAIR) 10 MG tablet TAKE 1 TABLET(10 MG) BY MOUTH AT BEDTIME 90 tablet 3  . [START ON 12/31/2019] sertraline (ZOLOFT) 100 MG tablet Take 1 tablet (100 mg total) by mouth daily. 30 tablet 2  . simvastatin (ZOCOR) 20 MG tablet TAKE 1 TABLET(20 MG) BY MOUTH EVERY EVENING 90 tablet 3  . tadalafil (CIALIS) 20 MG tablet TAKE ONE-HALF TO ONE TABLET BY MOUTH EVERY OTHER DAY AS NEEDED FOR ERECTILE DYSFUNCTION 10 tablet 4  . [START ON 01/08/2020] traZODone (DESYREL) 100 MG tablet Take 1 tablet (100 mg total) by mouth at bedtime as needed for sleep. 30 tablet 2   No current facility-administered medications for this visit.      Psychiatric Specialty Exam: Review of Systems  Musculoskeletal: Positive for back pain.  Psychiatric/Behavioral: Positive for sleep disturbance. The patient is nervous/anxious.   All other systems reviewed and are negative.   There were no vitals taken for this visit.There is no height or weight on file to calculate BMI.  General Appearance: NA  Eye Contact:  NA  Speech:  Clear and Coherent and Normal Rate  Volume:  Normal  Mood:  Some anxiety.  Affect:  NA  Thought Process:  Goal Directed and Linear  Orientation:  Full  (Time, Place, and Person)  Thought Content: Rumination   Suicidal Thoughts:  No  Homicidal Thoughts:  No  Memory:  Immediate;   Good Recent;   Good Remote;   Good  Judgement:  Good  Insight:  Fair  Psychomotor Activity:  NA  Concentration:  Concentration: Good  Recall:  Good  Fund of Knowledge: Good  Language: Good  Akathisia:  Negative  Handed:  Right  AIMS (if indicated): not done  Assets:  Communication Skills Desire for Improvement Financial Resources/Insurance Housing  ADL's:  Intact  Cognition: WNL  Sleep:  Fair   Screenings: PHQ2-9     Office Visit from 10/08/2019 in [facility] from [facility] Visit from 10/28/2016 in [facility] 04[facility]18/2016 in [facility]  PHQ-2 Total Score  2  0  0  0  0       Assessment and Plan: 54yo singleAAMwith past dx of schizophrenia paranoid type vsschizoaffective disorder. Patient has no hx of suicidal attempts but admits tohx ofon and off SI, he has a long hx of depression and excessive worrying as well as paranoid and grandiose delusions. He expressed havingsome bizarre perceptual disturbances e.g. that he suddenly stops hearing someone who he is talking to or the volume of conversation gradually decreases.He also reports brief, infrequent periods that he loses track of time. He has seen a neurologist for this in the past but "they did not find anything".We continuedclonazepam prn anxiety and insomnia. Dc [name] (no benefit) and insteadaddedVraylarforparanoia.Heappearedless paranoid though still anxious (takes clonazepam bid or tid depending on a day)initially butin early Decemberfears of other people following him increased. Hewas afraid toleave his house.We have added Prolixin 5 mg at bedtime which he tolerates well  and reports decrease of paranoid fears.Denies having AH. His sleepisfair but interrupted(has to go to the bathroom). Appetiteisgood.He is very apprehensive about taking medications which may cause weight gain or increase blood sugar. He would not consider clozapine for that reason. He focuses on multiple medical complaints: numbness in  both feet and some fingers in his hands, back pain, occasional problems with balance. None of these problems are new but he feels that they worsened recently. He has been seeing neurology for these symptoms likely secondary to peripheral neuropathy. No recent medication changes have been made and the only complaint he has are vivid, sometimes "scary" dreams.  [place]:[id number] schizophrenia vsSchizoaffective disorder depressed type  Plan: Continue Zoloft 100 mg,Vraylar6mg ,Klonopin 1 mgtid,trazodone 100 mg at HS for sleepand fluphenazine5 mg at HS.Next appointment in 11 weeks.The plan was discussed with patient who had an opportunity to ask questions and these were all answered.I spend 20 min in phone consultation with the patient.    Stephanie Acre, MD 12/02/2019, 1:44 PM

## 2019-12-12 ENCOUNTER — Encounter: Payer: Self-pay | Admitting: Orthopaedic Surgery

## 2019-12-13 ENCOUNTER — Other Ambulatory Visit (HOSPITAL_COMMUNITY): Payer: Self-pay | Admitting: Psychiatry

## 2019-12-18 ENCOUNTER — Ambulatory Visit: Payer: Medicare Other | Admitting: *Deleted

## 2019-12-18 ENCOUNTER — Ambulatory Visit: Payer: Medicare Other | Admitting: Orthopaedic Surgery

## 2019-12-24 ENCOUNTER — Ambulatory Visit (INDEPENDENT_AMBULATORY_CARE_PROVIDER_SITE_OTHER): Payer: Medicare Other | Admitting: Orthopaedic Surgery

## 2019-12-24 ENCOUNTER — Other Ambulatory Visit: Payer: Self-pay

## 2019-12-24 ENCOUNTER — Encounter: Payer: Self-pay | Admitting: Orthopaedic Surgery

## 2019-12-24 VITALS — BP 106/73 | HR 79 | Ht 71.0 in | Wt 200.0 lb

## 2019-12-24 DIAGNOSIS — M5136 Other intervertebral disc degeneration, lumbar region: Secondary | ICD-10-CM | POA: Diagnosis not present

## 2019-12-24 DIAGNOSIS — R200 Anesthesia of skin: Secondary | ICD-10-CM

## 2019-12-24 NOTE — Progress Notes (Signed)
Office Visit Note   Patient: Glenn Robertson           Date of Birth: 1965-09-27           MRN: [id number] Visit Date: 12/24/2019              Requested by: Libby Maw, MD [address] [place],  [place] [address] PCP: Libby Maw, MD   Assessment & Plan: Visit Diagnoses:  1. Lumbar degenerative disc disease   2. Arm numbness   3.     Neck pain  Plan: We discussed impact loading activities that are likely to bother his lower back and might bother his neck some degree.  We reviewed previous x-rays of his neck which showed normal curvature.  We reviewed plain radiographs as well as previous MRI done of his cervical spine 2013 which was entirely normal.  We cautioned him to gradually resume his weight toning activities and start with extremely low weights not what he finished up with when he stopped.  He would do better avoiding repetitive bending turning twisting activities for the lumbar spine.  He has had several MRI scans lumbar spine which are stable.  He can follow-up if he has increased symptoms and can gradually resume his workout regimen which is been an important part of his overall treatment for mental stress and wellness.  He can follow-up with me as needed.  Follow-Up Instructions: Return if symptoms worsen or fail to improve.   Orders:  No orders of the defined types were placed in this encounter.  No orders of the defined types were placed in this encounter.     Procedures: No procedures performed   Clinical Data: No additional findings.   Subjective: Chief Complaint  Patient presents with  . Neck - Pain    HPI 54 year old male returns with some intermittent symptoms of his back persistent.  He has called the office and was questioning what was a diagnosis with his lower back.  He has had previous ulnar nerve release at the right elbow and states at times he has some numbness in his right fifth finger particularly if he does not keep  his arm on a pillow or keeps it in a flexed position.  Previous lumbar MRI scan showed some disc space narrowing at L5-S1 with endplate edema and degenerative changes without significant central or foraminal compression.  He has had increased symptoms with riding a bike his back is sore and stiff the next day.  Sometimes he has been able to run a little bit other times running tends to aggravate his symptoms.  He has not done any weight toning exercises for many months.  Patient notes that when he uses the computer he asked that frequently change to the height of the monitor either lifting it or lowering it since he has some discomfort in the cervical spine staying in 1 position. He does not have any myelopathic complaints. Review of Systems use systems updated.  He does have diabetes also schizophrenia of note otherwise unchanged other than as mentioned HPI.   Objective: Vital Signs: BP 106/73   Pulse 79   Ht 5\' 11"  (1.803 m)   Wt 200 lb (90.7 kg)   BMI 27.89 kg/m   Physical Exam Constitutional:      Appearance: He is well-developed.  HENT:     Head: Normocephalic and atraumatic.  Eyes:     Pupils: Pupils are equal, round, and reactive to light.  Neck:  Thyroid: No thyromegaly.     Trachea: No tracheal deviation.  Cardiovascular:     Rate and Rhythm: Normal rate.  Pulmonary:     Effort: Pulmonary effort is normal.     Breath sounds: No wheezing.  Abdominal:     General: Bowel sounds are normal.     Palpations: Abdomen is soft.  Skin:    General: Skin is warm and dry.     Capillary Refill: Capillary refill takes less than 2 seconds.  Neurological:     Mental Status: He is alert and oriented to person, place, and time.  Psychiatric:        Behavior: Behavior normal.        Thought Content: Thought content normal.        Judgment: Judgment normal.     Ortho Exam healed small incision right cubital tunnel without ulnar nerve subluxation.  Interossei per fundi ring and small  finger first dorsal interosseous and FCU are all strong.  No brachial plexus tenderness.  Specialty Comments:  No specialty comments available.  Imaging: No results found.   PMFS History: Patient Active Problem List   Diagnosis Date Noted  . Elevated cholesterol 10/08/2019  . Benign prostatic hyperplasia with nocturia 10/08/2019  . Schizoaffective disorder, depressive type ([place]) 11/27/2018  . Urinary hesitancy 10/08/2018  . Erectile dysfunction 09/24/2018  . Arm numbness 08/20/2018  . Transient alteration of awareness 08/08/2018  . Tendinopathy of right gluteus medius 05/22/2018  . Pain of right hip joint 05/08/2018  . Hearing difficulty 04/13/2018  . Trochanteric bursitis, right hip 03/23/2018  . Asthma 03/16/2018  . Headache 03/02/2018  . Type 2 diabetes mellitus without complication, without long-term current use of insulin ([place]) 02/16/2018  . Healthcare maintenance 02/16/2018  . Pain in left leg 10/28/2016  . Lumbosacral radiculopathy at S1 03/15/2016  . Lumbar degenerative disc disease 12/18/2015  . Disc displacement, lumbar 04/21/2015  . Chondromalacia of both patellae 02/24/2015  . Paranoid schizophrenia, chronic condition ([place]) 02/24/2015  . Right anterior knee pain 07/18/2014  . Right ankle pain 01/27/2014  . Pain in joint, ankle and foot 01/27/2014  . Sciatica 01/10/2012  . Disturbance of skin sensation 11/14/2011   Past Medical History:  Diagnosis Date  . Allergy    dog and cats, citrus  . Anxiety   . Asthma   . Chronic pain   . Depression   . Diabetes mellitus   . Glaucoma   . Neuromuscular disorder ([place])   . Paranoid schizophrenia ([place])     Family History  Problem Relation Age of Onset  . Diabetes Father   . Diabetes Paternal Uncle   . Colon cancer Neg Hx   . Colon polyps Neg Hx   . Esophageal cancer Neg Hx   . Rectal cancer Neg Hx   . Stomach cancer Neg Hx     Past Surgical History:  Procedure Laterality Date  . ANKLE ARTHROSCOPY    . Arm  Surgery  1/12   rt ulnar nerve decompression  . EPIDURAL BLOCK INJECTION     several  . ULNAR NERVE TRANSPOSITION  01/19/2012   Procedure: ULNAR NERVE DECOMPRESSION/TRANSPOSITION;  Surgeon: Cammie Sickle., MD;  Location: [facility];  Service: Orthopedics;  Laterality: Left;  ulnar nerve decompression vs transposition left elbow   Social History   Occupational History  . Occupation: Unemlpoyed-disabled  Tobacco Use  . Smoking status: Never Smoker  . Smokeless tobacco: Never Used  Substance and Sexual Activity  .  Alcohol use: No  . Drug use: No  . Sexual activity: Not on file

## 2019-12-27 ENCOUNTER — Ambulatory Visit (INDEPENDENT_AMBULATORY_CARE_PROVIDER_SITE_OTHER): Payer: Medicare Other | Admitting: Psychology

## 2019-12-27 ENCOUNTER — Other Ambulatory Visit (HOSPITAL_COMMUNITY): Payer: Self-pay | Admitting: Psychiatry

## 2019-12-27 DIAGNOSIS — F331 Major depressive disorder, recurrent, moderate: Secondary | ICD-10-CM | POA: Diagnosis not present

## 2019-12-27 DIAGNOSIS — F251 Schizoaffective disorder, depressive type: Secondary | ICD-10-CM

## 2019-12-27 DIAGNOSIS — F4310 Post-traumatic stress disorder, unspecified: Secondary | ICD-10-CM | POA: Diagnosis not present

## 2019-12-27 DIAGNOSIS — R404 Transient alteration of awareness: Secondary | ICD-10-CM

## 2019-12-27 DIAGNOSIS — F200 Paranoid schizophrenia: Secondary | ICD-10-CM

## 2019-12-27 DIAGNOSIS — F411 Generalized anxiety disorder: Secondary | ICD-10-CM | POA: Diagnosis not present

## 2020-01-07 ENCOUNTER — Encounter: Payer: Self-pay | Admitting: Podiatry

## 2020-01-07 ENCOUNTER — Other Ambulatory Visit: Payer: Self-pay

## 2020-01-07 ENCOUNTER — Ambulatory Visit (INDEPENDENT_AMBULATORY_CARE_PROVIDER_SITE_OTHER): Payer: Medicare Other | Admitting: Podiatry

## 2020-01-07 ENCOUNTER — Other Ambulatory Visit: Payer: Self-pay | Admitting: Podiatry

## 2020-01-07 ENCOUNTER — Ambulatory Visit (INDEPENDENT_AMBULATORY_CARE_PROVIDER_SITE_OTHER): Payer: Medicare Other

## 2020-01-07 DIAGNOSIS — M778 Other enthesopathies, not elsewhere classified: Secondary | ICD-10-CM

## 2020-01-07 DIAGNOSIS — M722 Plantar fascial fibromatosis: Secondary | ICD-10-CM

## 2020-01-07 MED ORDER — MELOXICAM 15 MG PO TABS: 15.0000 mg | ORAL_TABLET | Freq: Every day | ORAL | 3 refills | Status: DC

## 2020-01-07 NOTE — Progress Notes (Signed)
He presents today with some generalized soreness beneath the forefoot right states sometimes the left foot are bothering him a little bit.  Has a history of diabetes with some diabetic peripheral neuropathy also has a history of back pain and sciatica to the left leg.  Feels that most of the pain is when he is dorsiflexing his toes or going up on his toes.  He states he does a lot of calf raises on the stairs to help keep his cast strong wants to know if this could be doing it.  Objective: Vital signs are stable he is alert and oriented x3 pulses are palpable.  Neurologic sensorium is intact deep tendon reflexes are intact muscle strength is normal symmetrical.  Orthopedic evaluation demonstrates mild pes planus otherwise rectus foot he has some very vague tenderness on palpation and end range of motion of the second third fourth fifth metatarsals bilateral.  Assessment: Possible early diabetic peripheral neuropathy but also demonstrates some history of capsulitis.  Plan: Discussed appropriate shoe gear such as a stiff soled shoe and with and I will follow-up with him on an as-needed basis.  We did discuss the use of Voltaren gel.

## 2020-01-10 ENCOUNTER — Other Ambulatory Visit: Payer: Self-pay

## 2020-01-10 ENCOUNTER — Telehealth: Payer: Self-pay | Admitting: Family Medicine

## 2020-01-10 ENCOUNTER — Other Ambulatory Visit: Payer: Self-pay | Admitting: Family Medicine

## 2020-01-10 NOTE — Telephone Encounter (Signed)
Patient is calling and requesting a refill for ventolin sent to [facility] on [address]. CB is [phone]

## 2020-01-20 ENCOUNTER — Other Ambulatory Visit (HOSPITAL_COMMUNITY): Payer: Self-pay | Admitting: Psychiatry

## 2020-01-31 ENCOUNTER — Ambulatory Visit (INDEPENDENT_AMBULATORY_CARE_PROVIDER_SITE_OTHER): Payer: Medicare Other | Admitting: Psychology

## 2020-01-31 DIAGNOSIS — F200 Paranoid schizophrenia: Secondary | ICD-10-CM

## 2020-01-31 DIAGNOSIS — F411 Generalized anxiety disorder: Secondary | ICD-10-CM | POA: Diagnosis not present

## 2020-01-31 DIAGNOSIS — F331 Major depressive disorder, recurrent, moderate: Secondary | ICD-10-CM | POA: Diagnosis not present

## 2020-01-31 DIAGNOSIS — F4310 Post-traumatic stress disorder, unspecified: Secondary | ICD-10-CM

## 2020-02-05 ENCOUNTER — Ambulatory Visit: Payer: Medicare Other | Admitting: *Deleted

## 2020-02-18 ENCOUNTER — Telehealth (INDEPENDENT_AMBULATORY_CARE_PROVIDER_SITE_OTHER): Payer: Medicare Other | Admitting: Psychiatry

## 2020-02-18 ENCOUNTER — Other Ambulatory Visit: Payer: Self-pay

## 2020-02-18 DIAGNOSIS — F251 Schizoaffective disorder, depressive type: Secondary | ICD-10-CM | POA: Diagnosis not present

## 2020-02-18 DIAGNOSIS — R404 Transient alteration of awareness: Secondary | ICD-10-CM | POA: Diagnosis not present

## 2020-02-18 MED ORDER — CLONAZEPAM 1 MG PO TABS: 1.0000 mg | ORAL_TABLET | Freq: Three times a day (TID) | ORAL | 1 refills | Status: DC | PRN

## 2020-02-18 MED ORDER — FLUPHENAZINE HCL 5 MG PO TABS: ORAL_TABLET | ORAL | 2 refills | Status: DC

## 2020-02-18 MED ORDER — SERTRALINE HCL 100 MG PO TABS: ORAL_TABLET | ORAL | 2 refills | Status: DC

## 2020-02-18 NOTE — Progress Notes (Signed)
BH MD/PA/NP OP Progress Note  02/18/2020 9:17 AM Glenn Robertson  MRN:  [id number] Interview was conducted by phone and I verified that I was speaking with the correct person using two identifiers. I discussed the limitations of evaluation and management by telemedicine and  the availability of in person appointments. Patient expressed understanding and agreed to proceed. Patient location - home; physician - home office.  Chief Complaint: "I still have those vivid, distracting dreams at times".  HPI: 54yo singleAAMwith past dx of schizophrenia paranoid type vsschizoaffective disorder. Patient has no hx of suicidal attempts but admits tohx ofon and off SI, he has a long hx of depression and excessive worrying as well as paranoid and grandiose delusions. He expressed havingsome bizarre perceptual disturbances e.g. that he suddenly stops hearing someone who he is talking to or the volume of conversation gradually decreases.He also reports brief, infrequent periods that he loses track of time. He has seen a neurologist for this in the past but "they did not find anything".We continuedclonazepam prn anxiety and insomnia. Dc [name] (no benefit) and insteadaddedVraylarforparanoia.Heappearedless paranoid though still anxious (takes clonazepambid ortiddepending on a day). We have added Prolixin 5 mg at bedtime when he became paranoid last December. He tolerates well and reports decrease of paranoid fears.Denies having AH. His sleepisfair but interrupted(has to go to the bathroom). Appetiteisgood.He is very apprehensive about taking medications which may cause weight gain or increase blood sugar (hemoglobin A1C has been elevated). He would not consider clozapine for that reason.No recent medication changes have been made and the only complaint he has are vivid, sometimes "scary" dreams.  Visit Diagnosis:    ICD-10-CM   1. Schizoaffective disorder, depressive type ([place])  F25.1  clonazePAM (KLONOPIN) 1 MG tablet  2. Transient alteration of awareness  R40.4 clonazePAM (KLONOPIN) 1 MG tablet    Past Psychiatric History: Please see intake H&P.  Past Medical History:  Past Medical History:  Diagnosis Date  . Allergy    dog and cats, citrus  . Anxiety   . Asthma   . Chronic pain   . Depression   . Diabetes mellitus   . Glaucoma   . Neuromuscular disorder ([place])   . Paranoid schizophrenia [facility])     Past Surgical History:  Procedure Laterality Date  . ANKLE ARTHROSCOPY    . Arm Surgery  1/12   rt ulnar nerve decompression  . EPIDURAL BLOCK INJECTION     several  . ULNAR NERVE TRANSPOSITION  01/19/2012   Procedure: ULNAR NERVE DECOMPRESSION/TRANSPOSITION;  Surgeon: Cammie Sickle., MD;  Location: [facility];  Service: Orthopedics;  Laterality: Left;  ulnar nerve decompression vs transposition left elbow    Family Psychiatric History: None.  Family History:  Family History  Problem Relation Age of Onset  . Diabetes Father   . Diabetes Paternal Uncle   . Colon cancer Neg Hx   . Colon polyps Neg Hx   . Esophageal cancer Neg Hx   . Rectal cancer Neg Hx   . Stomach cancer Neg Hx     Social History:  Social History   Socioeconomic History  . Marital status: Single    Spouse name: Not on file  . Number of children: 0  . Years of education: BA  . Highest education level: Not on file  Occupational History  . Occupation: Unemlpoyed-disabled  Tobacco Use  . Smoking status: Never Smoker  . Smokeless tobacco: Never Used  Vaping Use  . Vaping Use: Never used  Substance and Sexual Activity  . Alcohol use: No  . Drug use: No  . Sexual activity: Not on file  Other Topics Concern  . Not on file  Social History Narrative   Lives with mother   Caffeine use: Coke very rare   Right handed    Social Determinants of Health   Financial Resource Strain:   . Difficulty of Paying Living Expenses:   Food Insecurity:   . Worried About  Charity fundraiser in the Last Year:   . Arboriculturist in the Last Year:   Transportation Needs:   . Film/video editor (Medical):   Marland Kitchen Lack of Transportation (Non-Medical):   Physical Activity:   . Days of Exercise per Week:   . Minutes of Exercise per Session:   Stress:   . Feeling of Stress :   Social Connections:   . Frequency of Communication with Friends and Family:   . Frequency of Social Gatherings with Friends and Family:   . Attends Religious Services:   . Active Member of Clubs or Organizations:   . Attends Archivist Meetings:   Marland Kitchen Marital Status:     Allergies:  Allergies  Allergen Reactions  . Citric Acid Other (See Comments)    Runny nose, eyes water  . Food     Oranges or anything with citric acid    Metabolic Disorder Labs: Lab Results  Component Value Date   HGBA1C 7.6 (H) 10/08/2019   MPG 123 03/02/2018   No results found for: PROLACTIN Lab Results  Component Value Date   CHOL 124 07/08/2019   TRIG 106.0 07/08/2019   HDL 52.20 07/08/2019   CHOLHDL 2 07/08/2019   VLDL 21.2 07/08/2019   LDLCALC 50 07/08/2019   LDLCALC 55 09/24/2018   Lab Results  Component Value Date   TSH 1.02 07/08/2019   TSH 0.92 03/15/2018    Therapeutic Level Labs: No results found for: LITHIUM No results found for: VALPROATE No components found for:  CBMZ  Current Medications: Current Outpatient Medications  Medication Sig Dispense Refill  . ACCU-CHEK AVIVA PLUS test strip TEST THREE TIMES DAILY 100 strip 5  . ACCU-CHEK SOFTCLIX LANCETS lancets 3 (three) times daily. for testing  0  . albuterol (VENTOLIN HFA) 108 (90 Base) MCG/ACT inhaler INHALE 2 PUFFS BY MOUTH INTO THE LUNGS AS NEEDED 18 g 5  . ASPIRIN 81 PO Take 1 tablet by mouth daily.    Derrill Memo ON 02/26/2020] clonazePAM (KLONOPIN) 1 MG tablet Take 1 tablet (1 mg total) by mouth 3 (three) times daily as needed for anxiety. for anxiety 90 tablet 1  . [START ON 03/13/2020] fluPHENAZine (PROLIXIN)  5 MG tablet TAKE 1 TABLET(5 MG) BY MOUTH AT BEDTIME 30 tablet 2  . fluticasone (FLONASE) 50 MCG/ACT nasal spray SHAKE LIQUID AND USE 2 SPRAYS IN EACH NOSTRIL DAILY 16 g 6  . glipiZIDE (GLUCOTROL) 10 MG tablet TAKE 1 TABLET(10 MG) BY MOUTH TWICE DAILY 180 tablet 3  . JARDIANCE 25 MG TABS tablet TAKE 1 TABLET BY MOUTH DAILY 30 tablet 6  . LUMIGAN 0.01 % SOLN   0  . Meloxicam (MOBIC PO) Take 1 Dose by mouth as needed. FOR HIP PAIN    . meloxicam (MOBIC) 15 MG tablet Take 1 tablet (15 mg total) by mouth daily. 30 tablet 3  . metFORMIN (GLUCOPHAGE-XR) 500 MG 24 hr tablet TAKE 4 TABLETS(2000 MG) BY MOUTH DAILY WITH BREAKFAST 360 tablet 1  . montelukast (  SINGULAIR) 10 MG tablet TAKE 1 TABLET(10 MG) BY MOUTH AT BEDTIME 90 tablet 3  . [START ON 03/28/2020] sertraline (ZOLOFT) 100 MG tablet TAKE 1 TABLET(100 MG) BY MOUTH DAILY 30 tablet 2  . simvastatin (ZOCOR) 20 MG tablet TAKE 1 TABLET(20 MG) BY MOUTH EVERY EVENING 90 tablet 3  . tadalafil (CIALIS) 20 MG tablet TAKE ONE-HALF TO ONE TABLET BY MOUTH EVERY OTHER DAY AS NEEDED FOR ERECTILE DYSFUNCTION 10 tablet 4  . traZODone (DESYREL) 100 MG tablet Take 1 tablet (100 mg total) by mouth at bedtime as needed for sleep. 30 tablet 2  . VRAYLAR 6 MG CAPS TAKE 1 CAPSULE(6 MG) BY MOUTH DAILY 30 capsule 2   No current facility-administered medications for this visit.      Psychiatric Specialty Exam: Review of Systems  Psychiatric/Behavioral: Positive for sleep disturbance. The patient is nervous/anxious.   All other systems reviewed and are negative.   There were no vitals taken for this visit.There is no height or weight on file to calculate BMI.  General Appearance: NA  Eye Contact:  NA  Speech:  Clear and Coherent and Normal Rate  Volume:  Normal  Mood:  Less anxiou.  Affect:  NA  Thought Process:  Goal Directed  Orientation:  Full (Time, Place, and Person)  Thought Content: Rumination   Suicidal Thoughts:  No  Homicidal Thoughts:  No  Memory:   Immediate;   Good Recent;   Good Remote;   Good  Judgement:  Good  Insight:  Fair  Psychomotor Activity:  NA  Concentration:  Concentration: Good  Recall:  Good  Fund of Knowledge: Good  Language: Good  Akathisia:  Negative  Handed:  Right  AIMS (if indicated): not done  Assets:  Communication Skills Desire for Improvement Financial Resources/Insurance Housing  ADL's:  Intact  Cognition: WNL  Sleep:  Fair   Screenings: PHQ2-9     Office Visit from 10/08/2019 in [facility] from 10/28[facility]2016 [facility] in [facility]  PHQ-2 Total Score 2 0 0 0 0       Assessment and Plan: 54yo singleAAMwith past dx of schizophrenia paranoid type vsschizoaffective disorder. Patient has no hx of suicidal attempts but admits tohx ofon and off SI, he has a long hx of depression and excessive worrying as well as paranoid and grandiose delusions. He expressed havingsome bizarre perceptual disturbances e.g. that he suddenly stops hearing someone who he is talking to or the volume of conversation gradually decreases.He also reports brief, infrequent periods that he loses track of time. He has seen a neurologist for this in the past but "they did not find anything".We continuedclonazepam prn anxiety and insomnia. Dc [name] (no benefit) and insteadaddedVraylarforparanoia.Heappearedless paranoid though still anxious (takes clonazepambid ortiddepending on a day). We have added Prolixin 5 mg at bedtime when he became paranoid last December. He tolerates well and reports decrease of paranoid fears.Denies having AH. His sleepisfair but interrupted(has to go to the bathroom). Appetiteisgood.He is very apprehensive about taking medications which may cause  weight gain or increase blood sugar (hemoglobin A1C has been elevated). He would not consider clozapine for that reason.No recent medication changes have been made and the only complaint he has are vivid, sometimes "scary" dreams.  SJ:[id number] schizophrenia vsSchizoaffective disorder depressed type  Plan: Continue Zoloft 100 mg,Vraylar6mg ,Klonopin  1 mgtid,trazodone 100 mg at HS for sleepand fluphenazine5 mg at HS.Next appointment in2 months.The plan was discussed with patient who had an opportunity to ask questions and these were all answered.I spend 15 min in phone consultation with the patient.    Stephanie Acre, MD 02/18/2020, 9:17 AM

## 2020-02-25 ENCOUNTER — Other Ambulatory Visit: Payer: Self-pay

## 2020-02-25 NOTE — Progress Notes (Signed)
Subjective:   Glenn Robertson is a 54 y.o. male who presents for an Initial Medicare Annual Wellness Visit.  Review of Systems   Cardiac Risk Factors include: diabetes mellitus;dyslipidemia;sedentary lifestyle    Objective:    Today's Vitals   02/26/20 0854  BP: 110/78  Pulse: 62  Resp: 16  Temp: (!) 97.3 F (36.3 C)  TempSrc: Temporal  SpO2: 97%  Weight: 203 lb 12.8 oz (92.4 kg)  Height: 5\' 11"  (1.803 m)   Body mass index is 28.42 kg/m.  Advanced Directives 02/26/2020 05/09/2018 06/15/2017 12/14/2016 11/03/2016 09/02/2016 07/26/2016  Does Patient Have a Medical Advance Directive? No No No No No No No  Would patient like information on creating a medical advance directive? Yes (ED - Information included in AVS) No - Patient declined - No - Patient declined - No - Patient declined No - Patient declined    Current Medications (verified) Outpatient Encounter Medications as of 02/26/2020  Medication Sig  . ACCU-CHEK AVIVA PLUS test strip TEST THREE TIMES DAILY  . ACCU-CHEK SOFTCLIX LANCETS lancets 3 (three) times daily. for testing  . albuterol (VENTOLIN HFA) 108 (90 Base) MCG/ACT inhaler INHALE 2 PUFFS BY MOUTH INTO THE LUNGS AS NEEDED  . ASPIRIN 81 PO Take 1 tablet by mouth daily.  . clonazePAM (KLONOPIN) 1 MG tablet Take 1 tablet (1 mg total) by mouth 3 (three) times daily as needed for anxiety. for anxiety  . [START ON 03/13/2020] fluPHENAZine (PROLIXIN) 5 MG tablet TAKE 1 TABLET(5 MG) BY MOUTH AT BEDTIME  . fluticasone (FLONASE) 50 MCG/ACT nasal spray SHAKE LIQUID AND USE 2 SPRAYS IN EACH NOSTRIL DAILY  . glipiZIDE (GLUCOTROL) 10 MG tablet TAKE 1 TABLET(10 MG) BY MOUTH TWICE DAILY  . JARDIANCE 25 MG TABS tablet TAKE 1 TABLET BY MOUTH DAILY  . LUMIGAN 0.01 % SOLN   . meloxicam (MOBIC) 15 MG tablet Take 1 tablet (15 mg total) by mouth daily.  . metFORMIN (GLUCOPHAGE-XR) 500 MG 24 hr tablet TAKE 4 TABLETS(2000 MG) BY MOUTH DAILY WITH BREAKFAST  . montelukast (SINGULAIR) 10 MG  tablet TAKE 1 TABLET(10 MG) BY MOUTH AT BEDTIME  . [START ON 03/28/2020] sertraline (ZOLOFT) 100 MG tablet TAKE 1 TABLET(100 MG) BY MOUTH DAILY  . simvastatin (ZOCOR) 20 MG tablet TAKE 1 TABLET(20 MG) BY MOUTH EVERY EVENING  . tadalafil (CIALIS) 20 MG tablet TAKE ONE-HALF TO ONE TABLET BY MOUTH EVERY OTHER DAY AS NEEDED FOR ERECTILE DYSFUNCTION  . traZODone (DESYREL) 100 MG tablet Take 1 tablet (100 mg total) by mouth at bedtime as needed for sleep.  Marland Kitchen VRAYLAR 6 MG CAPS TAKE 1 CAPSULE(6 MG) BY MOUTH DAILY  . [DISCONTINUED] Meloxicam (MOBIC PO) Take 1 Dose by mouth as needed. FOR HIP PAIN  . [DISCONTINUED] pregabalin (LYRICA) 100 MG capsule Take 100 mg by mouth daily.   No facility-administered encounter medications on file as of 02/26/2020.    Allergies (verified) Citric acid and Food   History: Past Medical History:  Diagnosis Date  . Allergy    dog and cats, citrus  . Anxiety   . Asthma   . Chronic pain   . Depression   . Diabetes mellitus   . Glaucoma   . Neuromuscular disorder ([place])   . Paranoid schizophrenia [facility])    Past Surgical History:  Procedure Laterality Date  . ANKLE ARTHROSCOPY    . Arm Surgery  1/12   rt ulnar nerve decompression  . EPIDURAL BLOCK INJECTION     several  .  ULNAR NERVE TRANSPOSITION  01/19/2012   Procedure: ULNAR NERVE DECOMPRESSION/TRANSPOSITION;  Surgeon: Cammie Sickle., MD;  Location: [facility];  Service: Orthopedics;  Laterality: Left;  ulnar nerve decompression vs transposition left elbow   Family History  Problem Relation Age of Onset  . Diabetes Father   . Diabetes Paternal Uncle   . Colon cancer Neg Hx   . Colon polyps Neg Hx   . Esophageal cancer Neg Hx   . Rectal cancer Neg Hx   . Stomach cancer Neg Hx    Social History   Socioeconomic History  . Marital status: Single    Spouse name: Not on file  . Number of children: 0  . Years of education: BA  . Highest education level: Not on file  Occupational  History  . Occupation: Unemlpoyed-disabled  Tobacco Use  . Smoking status: Never Smoker  . Smokeless tobacco: Never Used  Vaping Use  . Vaping Use: Never used  Substance and Sexual Activity  . Alcohol use: No  . Drug use: No  . Sexual activity: Not on file  Other Topics Concern  . Not on file  Social History Narrative   Lives with mother   Caffeine use: Coke very rare   Right handed    Social Determinants of Health   Financial Resource Strain: Low Risk   . Difficulty of Paying Living Expenses: Not hard at all  Food Insecurity: No Food Insecurity  . Worried About Charity fundraiser in the Last Year: Never true  . Ran Out of Food in the Last Year: Never true  Transportation Needs: No Transportation Needs  . Lack of Transportation (Medical): No  . Lack of Transportation (Non-Medical): No  Physical Activity: Inactive  . Days of Exercise per Week: 0 days  . Minutes of Exercise per Session: 0 min  Stress:   . Feeling of Stress :   Social Connections: Socially Isolated  . Frequency of Communication with Friends and Family: Once a week  . Frequency of Social Gatherings with Friends and Family: Once a week  . Attends Religious Services: Never  . Active Member of Clubs or Organizations: No  . Attends Archivist Meetings: Never  . Marital Status: Never married   Tobacco Counseling Counseling given: Not Answered   Clinical Intake:  Pre-visit preparation completed: Yes  Pain : No/denies pain     Nutritional Status: BMI 25 -29 Overweight Nutritional Risks: None Diabetes: Yes CBG done?: No Did pt. bring in CBG monitor from home?: No  How often do you need to have someone help you when you read instructions, pamphlets, or other written materials from your doctor or pharmacy?: 1 - Never  Nutrition Risk Assessment:  Has the patient had any N/V/D within the last 2 months?  No  Does the patient have any non-healing wounds?  No  Has the patient had any  unintentional weight loss or weight gain?  No   Diabetes:  Is the patient diabetic?  Yes  If diabetic, was a CBG obtained today?  No  Did the patient bring in their glucometer from home?  No  How often do you monitor your CBG's? daily.   Financial Strains and Diabetes Management:  Are you having any financial strains with the device, your supplies or your medication? No .  Does the patient want to be seen by Chronic Care Management for management of their diabetes?  No  Would the patient like to be referred to  a Nutritionist or for Diabetic Management?  No   Diabetic Exams:  Diabetic Eye Exam: Completed 03/12/2019.  Diabetic Foot Exam: Completed 07/18/2019.  Interpreter Needed?: No  Information entered by :: Caroleen Hamman LPN  Activities of Daily Living In your present state of health, do you have any difficulty performing the following activities: 02/26/2020  Hearing? N  Vision? N  Difficulty concentrating or making decisions? Y  Comment occasionally forgets things  Walking or climbing stairs? N  Dressing or bathing? N  Doing errands, shopping? N  Preparing Food and eating ? N  Using the Toilet? N  In the past six months, have you accidently leaked urine? N  Do you have problems with loss of bowel control? N  Managing your Medications? N  Managing your Finances? N  Housekeeping or managing your Housekeeping? N  Some recent data might be hidden     Immunizations and Health Maintenance Immunization History  Administered Date(s) Administered  . Influenza Inj Mdck Quad With Preservative 06/09/2018  . Influenza,inj,Quad PF,6+ Mos 05/19/2017, 04/25/2019  . Influenza-Unspecified 06/09/2018  . PFIZER SARS-COV-2 Vaccination 11/12/2019, 12/04/2019  . Pneumococcal Conjugate-13 03/23/2017  . Tdap 06/28/2019   Health Maintenance Due  Topic Date Due  . Hepatitis C Screening  Never done    Patient Care Team: Libby Maw, MD as PCP - General (Family  Medicine)  Indicate any recent Medical Services you may have received from other than [facility] providers in the past year (date may be approximate).    Assessment:   This is a routine wellness examination for [place].  Hearing/Vision screen  Hearing Screening   125Hz  250Hz  500Hz  1000Hz  2000Hz  3000Hz  4000Hz  6000Hz  8000Hz   Right ear:           Left ear:           Comments: No issues  Vision Screening Comments: Wears glasses Glaucoma Sees Dr. Shelle Iron exam 03/12/2019  Dietary issues and exercise activities discussed: Current Exercise Habits: The patient does not participate in regular exercise at present, Exercise limited by: psychological condition(s)  Goals Addressed            This Visit's Progress   . Patient Stated       Increase exercise & lose weight      Depression Screen PHQ 2/9 Scores 02/26/2020 10/08/2019 05/09/2018 10/28/2016  PHQ - 2 Score 3 2 0 0  PHQ- 9 Score 11 - - -  Exception Documentation - - - Other- indicate reason in comment box    Fall Risk Fall Risk  02/26/2020 10/08/2019 05/09/2018 02/16/2018 10/28/2016  Falls in the past year? 1 1 No No No  Number falls in past yr: 0 0 - - -  Injury with Fall? 1 1 - - -  Comment black eye had stitches from fall above right eye - - -  Risk for fall due to : - - - - Other (Comment)  Follow up Falls prevention discussed - - - -  Comment - - - - -    FALL RISK PREVENTION PERTAINING TO THE HOME:  Any stairs in or around the home? No  Home free of loose throw rugs in walkways, pet beds, electrical cords, etc? Yes  Adequate lighting in your home to reduce risk of falls? Yes   ASSISTIVE DEVICES UTILIZED TO PREVENT FALLS:  Life alert? No  Use of a cane, walker or w/c? No  Grab bars in the bathroom? No  Shower chair or bench in shower? No  Elevated toilet seat or a handicapped toilet? No    TIMED UP AND GO:  Was the test performed? Yes .  Length of time to ambulate 10 feet: 9 sec.   GAIT:  Appearance of gait:  Gait steady and fast without the use of an assistive device.  Education: Fall risk prevention has been discussed.  Intervention(s) required? No   DME/home health order needed?  No    Cognitive Function:     6CIT Screen 02/26/2020  What Year? 0 points  What month? 0 points  What time? 0 points  Count back from 20 0 points  Months in reverse 0 points  Repeat phrase 0 points  Total Score 0    Screening Tests Health Maintenance  Topic Date Due  . Hepatitis C Screening  Never done  . PNEUMOCOCCAL POLYSACCHARIDE VACCINE AGE 42-64 HIGH RISK  07/07/2020 (Originally 09/02/1967)  . OPHTHALMOLOGY EXAM  03/11/2020  . INFLUENZA VACCINE  03/29/2020  . HEMOGLOBIN A1C  04/06/2020  . FOOT EXAM  07/07/2020  . URINE MICROALBUMIN  10/07/2020  . TETANUS/TDAP  06/27/2029  . COLONOSCOPY  09/03/2029  . COVID-19 Vaccine  Completed  . HIV Screening  Completed    Qualifies for Shingles Vaccine?  Patient states he thinks he has had the vaccine but is unsure. He will check with the pharmacy.  Tdap: Up to date  Flu Vaccine: Up to Date Due 04/2020  Pneumococcal Vaccine: Due for Pneumovax-23 vaccine. Does the patient want to receive this vaccine today?  No . Education has been provided regarding the importance of this vaccine but still declined. Advised may receive this vaccine at local pharmacy or Health Dept. Aware to provide a copy of the vaccination record if obtained from local pharmacy or Health Dept. Verbalized acceptance and understanding.   Covid-19:  Completed vaccines   Cancer Screenings:  Colorectal Screening: Completed 09/04/2019. Repeat every 10 years  Lung Cancer Screening: (Low Dose CT Chest recommended if Age 27-80 years, 30 pack-year currently smoking OR have quit w/in 15years.) does not qualify.    Additional Screening:  Hepatitis C Screening:Discuss with PCP  Vision Screening: Recommended annual ophthalmology exams for early detection of glaucoma and other disorders of the  eye. Is the patient up to date with their annual eye exam?  Yes  Who is the provider or what is the name of the office in which the pt attends annual eye exams? Dr. Trudie Reed   Dental Screening: Recommended annual dental exams for proper oral hygiene  Community Resource Referral:  CRR required this visit?  No        Plan:  I have personally reviewed and addressed the Medicare Annual Wellness questionnaire and have noted the following in the patient's chart:  A. Medical and social history B. Use of alcohol, tobacco or illicit drugs  C. Current medications and supplements D. Functional ability and status E.  Nutritional status F.  Physical activity G. Advance directives H. List of other physicians I.  Hospitalizations, surgeries, and ER visits in previous 12 months J.  [place] such as hearing and vision if needed, cognitive and depression L. Referrals and appointments   In addition, I have reviewed and discussed with patient certain preventive protocols, quality metrics, and best practice recommendations. A written personalized care plan for preventive services as well as general preventive health recommendations were provided to patient.   Signed,    Marta Antu, LPN   5/62/[id number]  Nurse Health Advisor    Nurse  Notes: During medication review, instructions for Metformin say to take 4 tabs daily with breakfast but patient states he takes 2 in the morning & 2 in the evening & wants to know if that is ok. Message sent to PCP.

## 2020-02-26 ENCOUNTER — Telehealth: Payer: Self-pay

## 2020-02-26 ENCOUNTER — Ambulatory Visit (INDEPENDENT_AMBULATORY_CARE_PROVIDER_SITE_OTHER): Payer: Medicare Other

## 2020-02-26 ENCOUNTER — Ambulatory Visit: Payer: Medicare Other | Admitting: *Deleted

## 2020-02-26 VITALS — BP 110/78 | HR 62 | Temp 97.3°F | Resp 16 | Ht 71.0 in | Wt 203.8 lb

## 2020-02-26 DIAGNOSIS — E119 Type 2 diabetes mellitus without complications: Secondary | ICD-10-CM

## 2020-02-26 DIAGNOSIS — Z0000 Encounter for general adult medical examination without abnormal findings: Secondary | ICD-10-CM

## 2020-02-26 MED ORDER — METFORMIN HCL ER 500 MG PO TB24: 1000.0000 mg | ORAL_TABLET | Freq: Two times a day (BID) | ORAL | 1 refills | Status: DC

## 2020-02-26 NOTE — Patient Instructions (Signed)
Glenn Robertson , Thank you for taking time to come for your Medicare Wellness Visit. I appreciate your ongoing commitment to your health goals. Please review the following plan we discussed and let me know if I can assist you in the future.   Screening recommendations/referrals: Colonoscopy: Completed 09/04/2019 Due 09/03/2029 Recommended yearly ophthalmology/optometry visit for glaucoma screening and checkup Recommended yearly dental visit for hygiene and checkup  Vaccinations: Influenza vaccine: Up to date-Due 04/2020 Pneumococcal vaccine: Completed Prevnar-13 03/23/2017- Discuss Pneumovax -23 with PCP at next office visit Tdap vaccine: Up to date Due 06/27/2029 Shingles vaccine: Discuss with pharmacy to see if immunization has been given    Covid-19: Completed vaccines  Advanced directives: Discussed. Information given.  Conditions/risks identified: See problem list  Next appointment: Follow up in one year for your annual wellness visit   Preventive Care 40-64 Years, Male Preventive care refers to lifestyle choices and visits with your health care provider that can promote health and wellness. What does preventive care include?  A yearly physical exam. This is also called an annual well check.  Dental exams once or twice a year.  Routine eye exams. Ask your health care provider how often you should have your eyes checked.  Personal lifestyle choices, including:  Daily care of your teeth and gums.  Regular physical activity.  Eating a healthy diet.  Avoiding tobacco and drug use.  Limiting alcohol use.  Practicing safe sex.  Taking low-dose aspirin every day starting at age 64. What happens during an annual well check? The services and screenings done by your health care provider during your annual well check will depend on your age, overall health, lifestyle risk factors, and family history of disease. Counseling  Your health care provider may ask you questions about  your:  Alcohol use.  Tobacco use.  Drug use.  Emotional well-being.  Home and relationship well-being.  Sexual activity.  Eating habits.  Work and work Statistician. Screening  You may have the following tests or measurements:  Height, weight, and BMI.  Blood pressure.  Lipid and cholesterol levels. These may be checked every 5 years, or more frequently if you are over 30 years old.  Skin check.  Lung cancer screening. You may have this screening every year starting at age 12 if you have a 30-pack-year history of smoking and currently smoke or have quit within the past 15 years.  Fecal occult blood test (FOBT) of the stool. You may have this test every year starting at age 75.  Flexible sigmoidoscopy or colonoscopy. You may have a sigmoidoscopy every 5 years or a colonoscopy every 10 years starting at age 32.  Prostate cancer screening. Recommendations will vary depending on your family history and other risks.  Hepatitis C blood test.  Hepatitis B blood test.  Sexually transmitted disease (STD) testing.  Diabetes screening. This is done by checking your blood sugar (glucose) after you have not eaten for a while (fasting). You may have this done every 1-3 years. Discuss your test results, treatment options, and if necessary, the need for more tests with your health care provider. Vaccines  Your health care provider may recommend certain vaccines, such as:  Influenza vaccine. This is recommended every year.  Tetanus, diphtheria, and acellular pertussis (Tdap, Td) vaccine. You may need a Td booster every 10 years.  Zoster vaccine. You may need this after age 3.  Pneumococcal 13-valent conjugate (PCV13) vaccine. You may need this if you have certain conditions and have not been  vaccinated.  Pneumococcal polysaccharide (PPSV23) vaccine. You may need one or two doses if you smoke cigarettes or if you have certain conditions. Talk to your health care provider about  which screenings and vaccines you need and how often you need them. This information is not intended to replace advice given to you by your health care provider. Make sure you discuss any questions you have with your health care provider. Document Released: 09/11/2015 Document Revised: 05/04/2016 Document Reviewed: 06/16/2015 Elsevier Interactive Patient Education  2017 [employer] Prevention in the Home Falls can cause injuries. They can happen to people of all ages. There are many things you can do to make your home safe and to help prevent falls. What can I do on the outside of my home?  Regularly fix the edges of walkways and driveways and fix any cracks.  Remove anything that might make you trip as you walk through a door, such as a raised step or threshold.  Trim any bushes or trees on the path to your home.  Use bright outdoor lighting.  Clear any walking paths of anything that might make someone trip, such as rocks or tools.  Regularly check to see if handrails are loose or broken. Make sure that both sides of any steps have handrails.  Any raised decks and porches should have guardrails on the edges.  Have any leaves, snow, or ice cleared regularly.  Use sand or salt on walking paths during winter.  Clean up any spills in your garage right away. This includes oil or grease spills. What can I do in the bathroom?  Use night lights.  Install grab bars by the toilet and in the tub and shower. Do not use towel bars as grab bars.  Use non-skid mats or decals in the tub or shower.  If you need to sit down in the shower, use a plastic, non-slip stool.  Keep the floor dry. Clean up any water that spills on the floor as soon as it happens.  Remove soap buildup in the tub or shower regularly.  Attach bath mats securely with double-sided non-slip rug tape.  Do not have throw rugs and other things on the floor that can make you trip. What can I do in the  bedroom?  Use night lights.  Make sure that you have a light by your bed that is easy to reach.  Do not use any sheets or blankets that are too big for your bed. They should not hang down onto the floor.  Have a firm chair that has side arms. You can use this for support while you get dressed.  Do not have throw rugs and other things on the floor that can make you trip. What can I do in the kitchen?  Clean up any spills right away.  Avoid walking on wet floors.  Keep items that you use a lot in easy-to-reach places.  If you need to reach something above you, use a strong step stool that has a grab bar.  Keep electrical cords out of the way.  Do not use floor polish or wax that makes floors slippery. If you must use wax, use non-skid floor wax.  Do not have throw rugs and other things on the floor that can make you trip. What can I do with my stairs?  Do not leave any items on the stairs.  Make sure that there are handrails on both sides of the stairs and use them. Fix  handrails that are broken or loose. Make sure that handrails are as long as the stairways.  Check any carpeting to make sure that it is firmly attached to the stairs. Fix any carpet that is loose or worn.  Avoid having throw rugs at the top or bottom of the stairs. If you do have throw rugs, attach them to the floor with carpet tape.  Make sure that you have a light switch at the top of the stairs and the bottom of the stairs. If you do not have them, ask someone to add them for you. What else can I do to help prevent falls?  Wear shoes that:  Do not have high heels.  Have rubber bottoms.  Are comfortable and fit you well.  Are closed at the toe. Do not wear sandals.  If you use a stepladder:  Make sure that it is fully opened. Do not climb a closed stepladder.  Make sure that both sides of the stepladder are locked into place.  Ask someone to hold it for you, if possible.  Clearly mark and make  sure that you can see:  Any grab bars or handrails.  First and last steps.  Where the edge of each step is.  Use tools that help you move around (mobility aids) if they are needed. These include:  Canes.  Walkers.  Scooters.  Crutches.  Turn on the lights when you go into a dark area. Replace any light bulbs as soon as they burn out.  Set up your furniture so you have a clear path. Avoid moving your furniture around.  If any of your floors are uneven, fix them.  If there are any pets around you, be aware of where they are.  Review your medicines with your doctor. Some medicines can make you feel dizzy. This can increase your chance of falling. Ask your doctor what other things that you can do to help prevent falls. This information is not intended to replace advice given to you by your health care provider. Make sure you discuss any questions you have with your health care provider. Document Released: 06/11/2009 Document Revised: 01/21/2016 Document Reviewed: 09/19/2014 Elsevier Interactive Patient Education  2017 [employer].

## 2020-02-26 NOTE — Telephone Encounter (Signed)
Take 2 in the morning and 2 again in the evening.

## 2020-02-27 ENCOUNTER — Ambulatory Visit: Payer: Medicare Other | Admitting: Family Medicine

## 2020-03-11 ENCOUNTER — Ambulatory Visit: Payer: Medicare Other | Admitting: Psychology

## 2020-03-11 ENCOUNTER — Ambulatory Visit (INDEPENDENT_AMBULATORY_CARE_PROVIDER_SITE_OTHER): Payer: Medicare Other | Admitting: Psychology

## 2020-03-11 DIAGNOSIS — F331 Major depressive disorder, recurrent, moderate: Secondary | ICD-10-CM

## 2020-03-11 DIAGNOSIS — F4310 Post-traumatic stress disorder, unspecified: Secondary | ICD-10-CM

## 2020-03-11 DIAGNOSIS — F200 Paranoid schizophrenia: Secondary | ICD-10-CM | POA: Diagnosis not present

## 2020-03-11 DIAGNOSIS — F411 Generalized anxiety disorder: Secondary | ICD-10-CM

## 2020-03-13 ENCOUNTER — Ambulatory Visit (INDEPENDENT_AMBULATORY_CARE_PROVIDER_SITE_OTHER): Payer: Medicare Other | Admitting: Orthopaedic Surgery

## 2020-03-13 ENCOUNTER — Ambulatory Visit (INDEPENDENT_AMBULATORY_CARE_PROVIDER_SITE_OTHER): Payer: Medicare Other

## 2020-03-13 ENCOUNTER — Encounter: Payer: Self-pay | Admitting: Orthopaedic Surgery

## 2020-03-13 DIAGNOSIS — M5442 Lumbago with sciatica, left side: Secondary | ICD-10-CM

## 2020-03-13 NOTE — Progress Notes (Signed)
Office Visit Note   Patient: Glenn Robertson           Date of Birth: 1966-08-21           MRN: [id number] Visit Date: 03/13/2020              Requested by: Libby Maw, MD [address] [place],  [place] [address] PCP: Libby Maw, MD   Assessment & Plan: Visit Diagnoses:  1. Acute bilateral low back pain with left-sided sciatica     Plan: Patient patient can continue to participate in the activities that do not bother him such as jogging or running.  He can ride his bicycle intermittently.  No evidence radiculopathy on exam.  Follow-up as needed.  Follow-Up Instructions: No follow-ups on file.   Orders:  Orders Placed This Encounter  Procedures  . XR Lumbar Spine 2-3 Views   No orders of the defined types were placed in this encounter.     Procedures: No procedures performed   Clinical Data: No additional findings.   Subjective: Chief Complaint  Patient presents with  . Lower Back - Pain    HPI 54 year old male seen with low back pain.  He states he fell out of bed 02/11/2020 since that time he said problems with his back discomfort with prolonged sitting.  He states that when he walks he has some pain it is able to get up and run or jog and has no pain with running or jogging.  Turning and twisting gives him minimal discomfort no associated bowel or bladder symptoms.  Previous MRI 2000 showed discogenic edematous changes with narrowing L5-S1 and no compressive lesions otherwise.  Patient has history of paranoid schizophrenia in the past likes to ride his bicycle as well as exercise regularly.  Review of Systems all other systems are negative as pertains HPI.   Objective: Vital Signs: There were no vitals taken for this visit.  Physical Exam Constitutional:      Appearance: He is well-developed.  HENT:     Head: Normocephalic and atraumatic.  Eyes:     Pupils: Pupils are equal, round, and reactive to light.  Neck:     Thyroid: No  thyromegaly.     Trachea: No tracheal deviation.  Cardiovascular:     Rate and Rhythm: Normal rate.  Pulmonary:     Effort: Pulmonary effort is normal.     Breath sounds: No wheezing.  Abdominal:     General: Bowel sounds are normal.     Palpations: Abdomen is soft.  Skin:    General: Skin is warm and dry.     Capillary Refill: Capillary refill takes less than 2 seconds.  Neurological:     Mental Status: He is alert and oriented to person, place, and time.  Psychiatric:        Behavior: Behavior normal.        Thought Content: Thought content normal.        Judgment: Judgment normal.     Ortho Exam patient gets from sitting to standing easily can heel and toe walk.  Forward flexion fingertips almost to his toes with knees straight.  No sciatic notch tenderness knee and ankle jerks are 1+ and symmetrical.  Good lower extremity strength.  Specialty Comments:  No specialty comments available.  Imaging: No results found.   PMFS History: Patient Active Problem List   Diagnosis Date Noted  . Elevated cholesterol 10/08/2019  . Benign prostatic hyperplasia with nocturia 10/08/2019  .  Schizoaffective disorder, depressive type ([place]) 11/27/2018  . Urinary hesitancy 10/08/2018  . Erectile dysfunction 09/24/2018  . Arm numbness 08/20/2018  . Transient alteration of awareness 08/08/2018  . Tendinopathy of right gluteus medius 05/22/2018  . Pain of right hip joint 05/08/2018  . Hearing difficulty 04/13/2018  . Trochanteric bursitis, right hip 03/23/2018  . Asthma 03/16/2018  . Headache 03/02/2018  . Type 2 diabetes mellitus without complication, without long-term current use of insulin ([place]) 02/16/2018  . Healthcare maintenance 02/16/2018  . Pain in left leg 10/28/2016  . Lumbosacral radiculopathy at S1 03/15/2016  . Lumbar degenerative disc disease 12/18/2015  . Disc displacement, lumbar 04/21/2015  . Chondromalacia of both patellae 02/24/2015  . Paranoid schizophrenia,  chronic condition ([place]) 02/24/2015  . Right anterior knee pain 07/18/2014  . Right ankle pain 01/27/2014  . Pain in joint, ankle and foot 01/27/2014  . Sciatica 01/10/2012  . Disturbance of skin sensation 11/14/2011   Past Medical History:  Diagnosis Date  . Allergy    dog and cats, citrus  . Anxiety   . Asthma   . Chronic pain   . Depression   . Diabetes mellitus   . Glaucoma   . Neuromuscular disorder ([place])   . Paranoid schizophrenia ([place])     Family History  Problem Relation Age of Onset  . Diabetes Father   . Diabetes Paternal Uncle   . Colon cancer Neg Hx   . Colon polyps Neg Hx   . Esophageal cancer Neg Hx   . Rectal cancer Neg Hx   . Stomach cancer Neg Hx     Past Surgical History:  Procedure Laterality Date  . ANKLE ARTHROSCOPY    . Arm Surgery  1/12   rt ulnar nerve decompression  . EPIDURAL BLOCK INJECTION     several  . ULNAR NERVE TRANSPOSITION  01/19/2012   Procedure: ULNAR NERVE DECOMPRESSION/TRANSPOSITION;  Surgeon: Cammie Sickle., MD;  Location: [place];  Service: Orthopedics;  Laterality: Left;  ulnar nerve decompression vs transposition left elbow   Social History   Occupational History  . Occupation: Unemlpoyed-disabled  Tobacco Use  . Smoking status: Never Smoker  . Smokeless tobacco: Never Used  Vaping Use  . Vaping Use: Never used  Substance and Sexual Activity  . Alcohol use: No  . Drug use: No  . Sexual activity: Not on file

## 2020-03-19 DIAGNOSIS — H401231 Low-tension glaucoma, bilateral, mild stage: Secondary | ICD-10-CM | POA: Diagnosis not present

## 2020-03-19 DIAGNOSIS — H04123 Dry eye syndrome of bilateral lacrimal glands: Secondary | ICD-10-CM | POA: Diagnosis not present

## 2020-03-19 DIAGNOSIS — H2513 Age-related nuclear cataract, bilateral: Secondary | ICD-10-CM | POA: Diagnosis not present

## 2020-03-19 DIAGNOSIS — H43813 Vitreous degeneration, bilateral: Secondary | ICD-10-CM | POA: Diagnosis not present

## 2020-03-23 ENCOUNTER — Encounter: Payer: Self-pay | Admitting: Family Medicine

## 2020-03-25 NOTE — Telephone Encounter (Signed)
Called pt and offered appt with Dr. Felecia Shelling on 04/17/2020. He declined, stating he has to take his mother to appt out of town. Advised I will discuss with MD and call back.

## 2020-03-25 NOTE — Telephone Encounter (Signed)
Next available appt with Amy NP is 06-11-20 at 1100 is Dr. Felecia Shelling able to see him sooner?

## 2020-03-30 NOTE — Telephone Encounter (Signed)
Called Glenn Robertson. Dr. Felecia Shelling approved working Glenn Robertson in this week for appt. He accepted 04/01/20 at 1:30pm, check in 1:00. I scheduled appt.

## 2020-04-01 ENCOUNTER — Ambulatory Visit (INDEPENDENT_AMBULATORY_CARE_PROVIDER_SITE_OTHER): Payer: Medicare Other | Admitting: Neurology

## 2020-04-01 ENCOUNTER — Encounter: Payer: Self-pay | Admitting: Neurology

## 2020-04-01 ENCOUNTER — Other Ambulatory Visit: Payer: Self-pay

## 2020-04-01 VITALS — BP 110/70 | HR 71 | Ht 71.0 in | Wt 199.5 lb

## 2020-04-01 DIAGNOSIS — G629 Polyneuropathy, unspecified: Secondary | ICD-10-CM

## 2020-04-01 DIAGNOSIS — R209 Unspecified disturbances of skin sensation: Secondary | ICD-10-CM | POA: Diagnosis not present

## 2020-04-01 DIAGNOSIS — M5417 Radiculopathy, lumbosacral region: Secondary | ICD-10-CM | POA: Diagnosis not present

## 2020-04-01 MED ORDER — GABAPENTIN 300 MG PO CAPS: 300.0000 mg | ORAL_CAPSULE | Freq: Three times a day (TID) | ORAL | 11 refills | Status: DC

## 2020-04-01 NOTE — Progress Notes (Signed)
[facility]  PATIENT: Glenn Robertson DOB: Jun 05, 1966  REFERRING DOCTOR OR PCP: Luetta Nutting SOURCE: Patient, notes from PCP, laboratory tests  _________________________________   HISTORICAL  CHIEF COMPLAINT:  Chief Complaint  Patient presents with   Follow-up    RM 12, alone. Last seen 10/08/19 by AL,NP. Here for balance problems.     HISTORY OF PRESENT ILLNESS:  Glenn Robertson is a 54 year old man noting more difficulty with balance.   I had previously seen him in 2020 for episodes of transient alteration of awareness, headaches and numbness.  He is noting more difficulty with his balance.   When he walks he veers some and he feels he might fall if he leans backwards.   He did fall once getting out of bed.    He has numbness in both feet.   He has had left sided sciatica starting 2012 that improved but recently worsened.  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   He was on gabapentin  He has had a few more spells of altered consciousness but he had no complete loss of consciousness.   We did an EEG in 09/2018 and it was normal.  He also has some numbness in his hands.   He has had ulnar nerve transpositions in the past.      He has Type 2 dm and is on metformin, glucotrol and Jardience.     REVIEW OF SYSTEMS: Constitutional: No fevers, chills, sweats, or change in appetite Eyes: No visual changes, double vision, eye pain Ear, nose and throat: No hearing loss, ear pain, nasal congestion, sore throat Cardiovascular: No chest pain, palpitations Respiratory: No shortness of breath at rest or with exertion.   No wheezes.  He snores. GastrointestinaI: No nausea, vomiting, diarrhea, abdominal pain, fecal incontinence Genitourinary: No dysuria, urinary retention or frequency.  No nocturia. Musculoskeletal: No neck pain, back pain Integumentary: No rash, pruritus, skin lesions Neurological: as  above Psychiatric: He has a diagnosis of paranoid schizophrenia.   Endocrine: No palpitations, diaphoresis, change in appetite, change in weigh or increased thirst Hematologic/Lymphatic: No anemia, purpura, petechiae. Allergic/Immunologic: No itchy/runny eyes, nasal congestion, recent allergic reactions, rashes  ALLERGIES: Allergies  Allergen Reactions   Citric Acid Other (See Comments)    Runny nose, eyes water   Food     Oranges or anything with citric acid    HOME MEDICATIONS:  Current Outpatient Medications:    ACCU-CHEK AVIVA PLUS test strip, TEST THREE TIMES DAILY, Disp: 100 strip, Rfl: 5   ACCU-CHEK SOFTCLIX LANCETS lancets, 3 (three) times daily. for testing, Disp: , Rfl: 0   albuterol (VENTOLIN HFA) 108 (90 Base) MCG/ACT inhaler, INHALE 2 PUFFS BY MOUTH INTO THE LUNGS AS NEEDED, Disp: 18 g, Rfl: 5   ASPIRIN 81 PO, Take 1 tablet by mouth daily., Disp: , Rfl:    clonazePAM (KLONOPIN) 1 MG tablet, Take 1 tablet (1 mg total) by mouth 3 (three) times daily as needed for anxiety. for anxiety, Disp: 90 tablet, Rfl: 1   fluPHENAZine (PROLIXIN) 5 MG tablet, TAKE 1 TABLET(5 MG) BY MOUTH AT BEDTIME, Disp: 30 tablet, Rfl: 2   fluticasone (FLONASE) 50 MCG/ACT nasal spray, SHAKE LIQUID AND USE 2 SPRAYS IN EACH NOSTRIL DAILY, Disp: 16 g, Rfl: 6   glipiZIDE (GLUCOTROL) 10 MG tablet, TAKE 1 TABLET(10 MG) BY MOUTH TWICE DAILY, Disp: 180 tablet, Rfl: 3   JARDIANCE 25 MG TABS tablet, TAKE 1 TABLET BY  MOUTH DAILY, Disp: 30 tablet, Rfl: 6   LUMIGAN 0.01 % SOLN, , Disp: , Rfl: 0   meloxicam (MOBIC) 15 MG tablet, Take 1 tablet (15 mg total) by mouth daily., Disp: 30 tablet, Rfl: 3   metFORMIN (GLUCOPHAGE-XR) 500 MG 24 hr tablet, Take 2 tablets (1,000 mg total) by mouth 2 (two) times daily., Disp: 360 tablet, Rfl: 1   montelukast (SINGULAIR) 10 MG tablet, TAKE 1 TABLET(10 MG) BY MOUTH AT BEDTIME, Disp: 90 tablet, Rfl: 3   sertraline (ZOLOFT) 100 MG tablet, TAKE 1 TABLET(100 MG) BY  MOUTH DAILY, Disp: 30 tablet, Rfl: 2   simvastatin (ZOCOR) 20 MG tablet, TAKE 1 TABLET(20 MG) BY MOUTH EVERY EVENING, Disp: 90 tablet, Rfl: 3   tadalafil (CIALIS) 20 MG tablet, TAKE ONE-HALF TO ONE TABLET BY MOUTH EVERY OTHER DAY AS NEEDED FOR ERECTILE DYSFUNCTION, Disp: 10 tablet, Rfl: 4   traZODone (DESYREL) 100 MG tablet, Take 1 tablet (100 mg total) by mouth at bedtime as needed for sleep., Disp: 30 tablet, Rfl: 2   VRAYLAR 6 MG CAPS, TAKE 1 CAPSULE(6 MG) BY MOUTH DAILY, Disp: 30 capsule, Rfl: 2   gabapentin (NEURONTIN) 300 MG capsule, Take 1 capsule (300 mg total) by mouth 3 (three) times daily., Disp: 90 capsule, Rfl: 11   RESTASIS 0.05 % ophthalmic emulsion, , Disp: , Rfl:   PAST MEDICAL HISTORY: Past Medical History:  Diagnosis Date   Allergy    dog and cats, citrus   Anxiety    Asthma    Chronic pain    Depression    Diabetes mellitus    Glaucoma    Neuromuscular disorder ([place])    Paranoid schizophrenia ([place])     PAST SURGICAL HISTORY: Past Surgical History:  Procedure Laterality Date   ANKLE ARTHROSCOPY     Arm Surgery  1/12   rt ulnar nerve decompression   EPIDURAL BLOCK INJECTION     several   ULNAR NERVE TRANSPOSITION  01/19/2012   Procedure: ULNAR NERVE DECOMPRESSION/TRANSPOSITION;  Surgeon: Cammie Sickle., MD;  Location: [place];  Service: Orthopedics;  Laterality: Left;  ulnar nerve decompression vs transposition left elbow    FAMILY HISTORY: Family History  Problem Relation Age of Onset   Diabetes Father    Diabetes Paternal Uncle    Colon cancer Neg Hx    Colon polyps Neg Hx    Esophageal cancer Neg Hx    Rectal cancer Neg Hx    Stomach cancer Neg Hx     SOCIAL HISTORY:  Social History   Socioeconomic History   Marital status: Single    Spouse name: Not on file   Number of children: 0   Years of education: BA   Highest education level: Not on file  Occupational History   Occupation:  Unemlpoyed-disabled  Tobacco Use   Smoking status: Never Smoker   Smokeless tobacco: Never Used  Scientific laboratory technician Use: Never used  Substance and Sexual Activity   Alcohol use: No   Drug use: No   Sexual activity: Not on file  Other Topics Concern   Not on file  Social History Narrative   Lives with mother   Caffeine use: Coke very rare   Right handed    Social Determinants of Health   Financial Resource Strain: Low Risk    Difficulty of Paying Living Expenses: Not hard at all  Food Insecurity: No Food Insecurity   Worried About Charity fundraiser in the  Last Year: Never true   Ran Out of Food in the Last Year: Never true  Transportation Needs: No Transportation Needs   Lack of Transportation (Medical): No   Lack of Transportation (Non-Medical): No  Physical Activity: Inactive   Days of Exercise per Week: 0 days   Minutes of Exercise per Session: 0 min  Stress:    Feeling of Stress :   Social Connections: Socially Isolated   Frequency of Communication with Friends and Family: Once a week   Frequency of Social Gatherings with Friends and Family: Once a week   Attends Religious Services: Never   Marine scientist or Organizations: No   Attends Archivist Meetings: Never   Marital Status: Never married  Human resources officer Violence:    Fear of Current or Ex-Partner:    Emotionally Abused:    Physically Abused:    Sexually Abused:      PHYSICAL EXAM  Vitals:   04/01/20 1312  BP: 110/70  Pulse: 71  Weight: 199 lb 8 oz (90.5 kg)  Height: 5\' 11"  (1.803 m)    Body mass index is 27.82 kg/m.   General: The patient is well-developed and well-nourished and in no acute distress   Neck: The neck is supple, no carotid bruits are noted.  The neck is nontender.  Cardiovascular: The heart has a regular rate and rhythm with a normal S1 and S2. There were no murmurs, gallops or rubs.  Skin: Extremities are without rash or  edema.  Musculoskeletal:  Back is nontender  Neurologic Exam  Mental status: The patient is alert and oriented x 3 at the time of the examination. The patient has apparent normal recent and remote memory, with an apparently normal attention span and concentration ability.   Speech is normal.  Cranial nerves: Extraocular movements are full.  . There is good facial sensation to soft touch bilaterally.Facial strength is normal.  Trapezius and sternocleidomastoid strength is normal. No dysarthria is noted.  The tongue is midline, and the patient has symmetric elevation of the soft palate. No obvious hearing deficits are noted.  Motor:  Muscle bulk is normal.   Tone is normal. Strength is  5 / 5 in all 4 extremities.   Sensory: Sensory testing is intact to pinprick, soft touch and vibration sensation in the arms.  He reports mildly reduced touch and vibration sensation in the toes but not at the ankles.  Coordination: Cerebellar testing reveals good finger-nose-finger and heel-to-shin bilaterally.  Gait and station: Station is normal.   Gait is normal. Tandem gait is normal. Romberg is negative.   Reflexes: Deep tendon reflexes are symmetric and normal bilaterally.   Plantar responses are flexor.    DIAGNOSTIC DATA (LABS, IMAGING, TESTING) - I reviewed patient records, labs, notes, testing and imaging myself where available.  Lab Results  Component Value Date   WBC 6.3 10/08/2019   HGB 15.1 10/08/2019   HCT 46.0 10/08/2019   MCV 87.6 10/08/2019   PLT 177.0 10/08/2019      Component Value Date/Time   NA 136 10/08/2019 0922   K 3.7 10/08/2019 0922   CL 104 10/08/2019 0922   CO2 27 10/08/2019 0922   GLUCOSE 154 (H) 10/08/2019 0922   BUN 13 10/08/2019 0922   CREATININE 1.03 10/08/2019 0922   CREATININE 1.10 03/02/2018 1451   CALCIUM 9.7 10/08/2019 0922   PROT 6.9 10/08/2019 0922   ALBUMIN 4.5 10/08/2019 0922   AST 14 10/08/2019 0922   ALT  11 10/08/2019 0922   ALKPHOS 60  10/08/2019 0922   BILITOT 0.6 10/08/2019 0922   GFRNONAA >60 06/28/2019 1040   GFRAA >60 06/28/2019 1040   Lab Results  Component Value Date   CHOL 124 07/08/2019   HDL 52.20 07/08/2019   LDLCALC 50 07/08/2019   LDLDIRECT 46.0 10/08/2019   TRIG 106.0 07/08/2019   CHOLHDL 2 07/08/2019   Lab Results  Component Value Date   HGBA1C 7.6 (H) 10/08/2019   Lab Results  Component Value Date   VITAMINB12 >1500 (H) 07/08/2019   Lab Results  Component Value Date   TSH 1.02 07/08/2019       ASSESSMENT AND PLAN  Polyneuropathy - Plan: NCV with EMG(electromyography)  Lumbosacral radiculopathy at S1 - Plan: NCV with EMG(electromyography)  Disturbance of skin sensation - Plan: NCV with EMG(electromyography)  1.  We will check an NCV/EMG to determine if there is neuropathy contributing to his symptoms.  If symptoms worsen and NCV is normal, consider MRI of the cervical spine to rule out myelopathy. 2.  gabapentin for numbness/tingling if it worsens 3.   He will return to see me at the NCV/EMG study or sooner if there are new or worsening neurologic symptoms.    Augusten Lipkin A. Felecia Shelling, MD, [facility] 7/0/7867, 5:44 PM Certified in Neurology, Clinical Neurophysiology, Sleep Medicine, Pain Medicine and Neuroimaging  [facility] [address], [place], [place] [address] [phone]

## 2020-04-04 ENCOUNTER — Other Ambulatory Visit: Payer: Self-pay | Admitting: Family Medicine

## 2020-04-06 ENCOUNTER — Other Ambulatory Visit: Payer: Self-pay

## 2020-04-06 ENCOUNTER — Encounter: Payer: Self-pay | Admitting: Family Medicine

## 2020-04-06 ENCOUNTER — Ambulatory Visit (INDEPENDENT_AMBULATORY_CARE_PROVIDER_SITE_OTHER): Payer: Medicare Other | Admitting: Family Medicine

## 2020-04-06 VITALS — BP 104/68 | HR 77 | Temp 97.1°F | Ht 71.0 in | Wt 200.0 lb

## 2020-04-06 DIAGNOSIS — E7800 Pure hypercholesterolemia, unspecified: Secondary | ICD-10-CM | POA: Diagnosis not present

## 2020-04-06 DIAGNOSIS — Z1159 Encounter for screening for other viral diseases: Secondary | ICD-10-CM | POA: Diagnosis not present

## 2020-04-06 DIAGNOSIS — N529 Male erectile dysfunction, unspecified: Secondary | ICD-10-CM

## 2020-04-06 DIAGNOSIS — E119 Type 2 diabetes mellitus without complications: Secondary | ICD-10-CM | POA: Diagnosis not present

## 2020-04-06 DIAGNOSIS — Z0000 Encounter for general adult medical examination without abnormal findings: Secondary | ICD-10-CM

## 2020-04-06 LAB — COMPREHENSIVE METABOLIC PANEL
ALT: 8 U/L (ref 0–53)
AST: 11 U/L (ref 0–37)
Albumin: 4.4 g/dL (ref 3.5–5.2)
Alkaline Phosphatase: 55 U/L (ref 39–117)
BUN: 13 mg/dL (ref 6–23)
CO2: 25 mEq/L (ref 19–32)
Calcium: 9.4 mg/dL (ref 8.4–10.5)
Chloride: 105 mEq/L (ref 96–112)
Creatinine, Ser: 0.95 mg/dL (ref 0.40–1.50)
GFR: 100.12 mL/min (ref >60.00)
Glucose, Bld: 76 mg/dL (ref 70–99)
Potassium: 3.9 mEq/L (ref 3.5–5.1)
Sodium: 140 mEq/L (ref 135–145)
Total Bilirubin: 0.9 mg/dL (ref 0.2–1.2)
Total Protein: 6.8 g/dL (ref 6.0–8.3)

## 2020-04-06 LAB — CBC
HCT: 46.2 % (ref 39.0–52.0)
Hemoglobin: 15.2 g/dL (ref 13.0–17.0)
MCHC: 32.8 g/dL (ref 30.0–36.0)
MCV: 87.3 fl (ref 78.0–100.0)
Platelets: 154 10*3/uL (ref 150.0–400.0)
RBC: 5.29 Mil/uL (ref 4.22–5.81)
RDW: 13 % (ref 11.5–15.5)
WBC: 6.5 10*3/uL (ref 4.0–10.5)

## 2020-04-06 LAB — LIPID PANEL
Cholesterol: 96 mg/dL (ref 0–200)
HDL: 45.1 mg/dL (ref >39.00)
LDL Cholesterol: 41 mg/dL (ref 0–99)
NonHDL: 50.81
Total CHOL/HDL Ratio: 2
Triglycerides: 50 mg/dL (ref 0.0–149.0)
VLDL: 10 mg/dL (ref 0.0–40.0)

## 2020-04-06 LAB — HEMOGLOBIN A1C: Hgb A1c MFr Bld: 6.9 % — ABNORMAL HIGH (ref 4.6–6.5)

## 2020-04-06 MED ORDER — SILDENAFIL CITRATE 20 MG PO TABS: ORAL_TABLET | ORAL | 1 refills | Status: DC

## 2020-04-06 NOTE — Progress Notes (Signed)
Established Patient Office Visit  Subjective:  Patient ID: Glenn Robertson, male    DOB: [birth year]  Age: 54 y.o. MRN: [id number]  CC:  Chief Complaint  Patient presents with  . Follow-up    6 month follow up on diabetes,     HPI [name] presents for diabetes, elevated cholesterol routine health maintenance.  Patient is fasting today.  Continues Zocor without issue.  Diabetes has been well controlled with Glucophage.  Just saw the eye doctor last month.  Up-to-date on health maintenance.  Having issues with his back.  Status post recent injections that have helped.  Would like to try something other than Cialis for ED.  Seeing orthopedics for back pain.  It has affected his exercise routine.  He is status post recent injection that helped.  Past Medical History:  Diagnosis Date  . Allergy    dog and cats, citrus  . Anxiety   . Asthma   . Chronic pain   . Depression   . Diabetes mellitus   . Glaucoma   . Neuromuscular disorder ([place])   . Paranoid schizophrenia [facility])     Past Surgical History:  Procedure Laterality Date  . ANKLE ARTHROSCOPY    . Arm Surgery  1/12   rt ulnar nerve decompression  . EPIDURAL BLOCK INJECTION     several  . ULNAR NERVE TRANSPOSITION  01/19/2012   Procedure: ULNAR NERVE DECOMPRESSION/TRANSPOSITION;  Surgeon: Cammie Sickle., MD;  Location: [place];  Service: Orthopedics;  Laterality: Left;  ulnar nerve decompression vs transposition left elbow    Family History  Problem Relation Age of Onset  . Diabetes Father   . Diabetes Paternal Uncle   . Colon cancer Neg Hx   . Colon polyps Neg Hx   . Esophageal cancer Neg Hx   . Rectal cancer Neg Hx   . Stomach cancer Neg Hx     Social History   Socioeconomic History  . Marital status: Single    Spouse name: Not on file  . Number of children: 0  . Years of education: BA  . Highest education level: Not on file  Occupational History  . Occupation: Unemlpoyed-disabled    Tobacco Use  . Smoking status: Never Smoker  . Smokeless tobacco: Never Used  Vaping Use  . Vaping Use: Never used  Substance and Sexual Activity  . Alcohol use: No  . Drug use: No  . Sexual activity: Not on file  Other Topics Concern  . Not on file  Social History Narrative   Lives with mother   Caffeine use: Coke very rare   Right handed    Social Determinants of Health   Financial Resource Strain: Low Risk   . Difficulty of Paying Living Expenses: Not hard at all  Food Insecurity: No Food Insecurity  . Worried About Charity fundraiser in the Last Year: Never true  . Ran Out of Food in the Last Year: Never true  Transportation Needs: No Transportation Needs  . Lack of Transportation (Medical): No  . Lack of Transportation (Non-Medical): No  Physical Activity: Inactive  . Days of Exercise per Week: 0 days  . Minutes of Exercise per Session: 0 min  Stress:   . Feeling of Stress :   Social Connections: Socially Isolated  . Frequency of Communication with Friends and Family: Once a week  . Frequency of Social Gatherings with Friends and Family: Once a week  . Attends Religious Services: Never  .  Active Member of Clubs or Organizations: No  . Attends Archivist Meetings: Never  . Marital Status: Never married  Intimate Partner Violence:   . Fear of Current or Ex-Partner:   . Emotionally Abused:   Marland Kitchen Physically Abused:   . Sexually Abused:     Outpatient Medications Prior to Visit  Medication Sig Dispense Refill  . ACCU-CHEK AVIVA PLUS test strip TEST THREE TIMES DAILY 100 strip 5  . ACCU-CHEK SOFTCLIX LANCETS lancets 3 (three) times daily. for testing  0  . albuterol (VENTOLIN HFA) 108 (90 Base) MCG/ACT inhaler INHALE 2 PUFFS BY MOUTH INTO THE LUNGS AS NEEDED 18 g 5  . ASPIRIN 81 PO Take 1 tablet by mouth daily.    . clonazePAM (KLONOPIN) 1 MG tablet Take 1 tablet (1 mg total) by mouth 3 (three) times daily as needed for anxiety. for anxiety 90 tablet 1  .  fluPHENAZine (PROLIXIN) 5 MG tablet TAKE 1 TABLET(5 MG) BY MOUTH AT BEDTIME 30 tablet 2  . fluticasone (FLONASE) 50 MCG/ACT nasal spray SHAKE LIQUID AND USE 2 SPRAYS IN EACH NOSTRIL DAILY 16 g 6  . gabapentin (NEURONTIN) 300 MG capsule Take 1 capsule (300 mg total) by mouth 3 (three) times daily. 90 capsule 11  . glipiZIDE (GLUCOTROL) 10 MG tablet TAKE 1 TABLET(10 MG) BY MOUTH TWICE DAILY 180 tablet 3  . JARDIANCE 25 MG TABS tablet TAKE 1 TABLET BY MOUTH DAILY 30 tablet 6  . LUMIGAN 0.01 % SOLN   0  . meloxicam (MOBIC) 15 MG tablet Take 1 tablet (15 mg total) by mouth daily. 30 tablet 3  . metFORMIN (GLUCOPHAGE-XR) 500 MG 24 hr tablet Take 2 tablets (1,000 mg total) by mouth 2 (two) times daily. 360 tablet 1  . montelukast (SINGULAIR) 10 MG tablet TAKE 1 TABLET(10 MG) BY MOUTH AT BEDTIME 90 tablet 3  . RESTASIS 0.05 % ophthalmic emulsion     . sertraline (ZOLOFT) 100 MG tablet TAKE 1 TABLET(100 MG) BY MOUTH DAILY 30 tablet 2  . simvastatin (ZOCOR) 20 MG tablet TAKE 1 TABLET(20 MG) BY MOUTH EVERY EVENING 90 tablet 3  . tadalafil (CIALIS) 20 MG tablet TAKE ONE-HALF TO ONE TABLET BY MOUTH EVERY OTHER DAY AS NEEDED FOR ERECTILE DYSFUNCTION 10 tablet 4  . traZODone (DESYREL) 100 MG tablet Take 1 tablet (100 mg total) by mouth at bedtime as needed for sleep. 30 tablet 2  . VRAYLAR 6 MG CAPS TAKE 1 CAPSULE(6 MG) BY MOUTH DAILY 30 capsule 2   No facility-administered medications prior to visit.    Allergies  Allergen Reactions  . Citric Acid Other (See Comments)    Runny nose, eyes water  . Food     Oranges or anything with citric acid    ROS Review of Systems  Constitutional: Negative.   HENT: Negative.   Eyes: Negative for photophobia and visual disturbance.  Respiratory: Negative.   Cardiovascular: Negative.   Gastrointestinal: Negative.   Endocrine: Negative for polyphagia and polyuria.  Genitourinary: Negative.   Musculoskeletal: Positive for back pain.  Allergic/Immunologic:  Negative for immunocompromised state.  Neurological: Negative for weakness and numbness.  Hematological: Does not bruise/bleed easily.  Psychiatric/Behavioral: Negative.       Objective:    Physical Exam Vitals and nursing note reviewed.  Constitutional:      General: He is not in acute distress.    Appearance: Normal appearance. He is normal weight. He is not ill-appearing, toxic-appearing or diaphoretic.  HENT:  Head: Normocephalic and atraumatic.     Right Ear: Tympanic membrane, ear canal and external ear normal.     Left Ear: Tympanic membrane, ear canal and external ear normal.     Mouth/Throat:     Mouth: Mucous membranes are moist.     Pharynx: Oropharynx is clear. No oropharyngeal exudate or posterior oropharyngeal erythema.  Eyes:     General: No scleral icterus.       Right eye: No discharge.        Left eye: No discharge.     Extraocular Movements: Extraocular movements intact.     Conjunctiva/sclera: Conjunctivae normal.     Pupils: Pupils are equal, round, and reactive to light.  Neck:     Vascular: Carotid bruit present.  Cardiovascular:     Rate and Rhythm: Normal rate and regular rhythm.  Pulmonary:     Effort: Pulmonary effort is normal.     Breath sounds: Normal breath sounds.  Abdominal:     General: Bowel sounds are normal.  Musculoskeletal:     Cervical back: Normal range of motion and neck supple. No rigidity or tenderness.     Right lower leg: No edema.     Left lower leg: No edema.  Skin:    General: Skin is warm and dry.  Neurological:     General: No focal deficit present.     Mental Status: He is alert and oriented to person, place, and time.  Psychiatric:        Mood and Affect: Mood normal.        Behavior: Behavior normal.     BP 104/68   Pulse 77   Temp (!) 97.1 F (36.2 C) (Tympanic)   Ht 5\' 11"  (1.803 m)   Wt 200 lb (90.7 kg)   SpO2 97%   BMI 27.89 kg/m  Wt Readings from Last 3 Encounters:  04/06/20 200 lb (90.7 kg)    04/01/20 199 lb 8 oz (90.5 kg)  02/26/20 203 lb 12.8 oz (92.4 kg)     Health Maintenance Due  Topic Date Due  . Hepatitis C Screening  Never done  . INFLUENZA VACCINE  03/29/2020  . HEMOGLOBIN A1C  04/06/2020    There are no preventive care reminders to display for this patient.  Lab Results  Component Value Date   TSH 1.02 07/08/2019   Lab Results  Component Value Date   WBC 6.3 10/08/2019   HGB 15.1 10/08/2019   HCT 46.0 10/08/2019   MCV 87.6 10/08/2019   PLT 177.0 10/08/2019   Lab Results  Component Value Date   NA 136 10/08/2019   K 3.7 10/08/2019   CO2 27 10/08/2019   GLUCOSE 154 (H) 10/08/2019   BUN 13 10/08/2019   CREATININE 1.03 10/08/2019   BILITOT 0.6 10/08/2019   ALKPHOS 60 10/08/2019   AST 14 10/08/2019   ALT 11 10/08/2019   PROT 6.9 10/08/2019   ALBUMIN 4.5 10/08/2019   CALCIUM 9.7 10/08/2019   ANIONGAP 9 06/28/2019   GFR 91.37 10/08/2019   Lab Results  Component Value Date   CHOL 124 07/08/2019   Lab Results  Component Value Date   HDL 52.20 07/08/2019   Lab Results  Component Value Date   LDLCALC 50 07/08/2019   Lab Results  Component Value Date   TRIG 106.0 07/08/2019   Lab Results  Component Value Date   CHOLHDL 2 07/08/2019   Lab Results  Component Value Date   HGBA1C 7.6 (H)  10/08/2019      Assessment & Plan:   Problem List Items Addressed This Visit      Endocrine   Type 2 diabetes mellitus without complication, without long-term current use of insulin ([place]) - Primary   Relevant Orders   CBC   Comprehensive metabolic panel   Hemoglobin A1c     Other   Healthcare maintenance   Relevant Orders   Hepatitis C antibody   Erectile dysfunction   Relevant Medications   sildenafil (REVATIO) 20 MG tablet   Elevated cholesterol   Relevant Medications   sildenafil (REVATIO) 20 MG tablet   Other Relevant Orders   Comprehensive metabolic panel   Lipid panel      Meds ordered this encounter  Medications  .  sildenafil (REVATIO) 20 MG tablet    Sig: May take 3-5 tablets daily as needed 45 minutes prior.    Dispense:  50 tablet    Refill:  1    Follow-up: Return in about 6 months (around 10/07/2020).  Continue all medications as above.  Follow-up in 6 months.  Libby Maw, MD

## 2020-04-07 LAB — HEPATITIS C ANTIBODY
Hepatitis C Ab: NONREACTIVE
SIGNAL TO CUT-OFF: 0.04 (ref <1.00)

## 2020-04-08 ENCOUNTER — Ambulatory Visit (INDEPENDENT_AMBULATORY_CARE_PROVIDER_SITE_OTHER): Payer: Medicare Other | Admitting: Psychology

## 2020-04-08 DIAGNOSIS — F4310 Post-traumatic stress disorder, unspecified: Secondary | ICD-10-CM | POA: Diagnosis not present

## 2020-04-08 DIAGNOSIS — F331 Major depressive disorder, recurrent, moderate: Secondary | ICD-10-CM

## 2020-04-08 DIAGNOSIS — F411 Generalized anxiety disorder: Secondary | ICD-10-CM

## 2020-04-20 ENCOUNTER — Telehealth (INDEPENDENT_AMBULATORY_CARE_PROVIDER_SITE_OTHER): Payer: Medicare Other | Admitting: Psychiatry

## 2020-04-20 ENCOUNTER — Other Ambulatory Visit: Payer: Self-pay

## 2020-04-20 DIAGNOSIS — R404 Transient alteration of awareness: Secondary | ICD-10-CM | POA: Diagnosis not present

## 2020-04-20 DIAGNOSIS — F251 Schizoaffective disorder, depressive type: Secondary | ICD-10-CM | POA: Diagnosis not present

## 2020-04-20 MED ORDER — CLONAZEPAM 1 MG PO TABS: 1.0000 mg | ORAL_TABLET | Freq: Three times a day (TID) | ORAL | 1 refills | Status: DC | PRN

## 2020-04-20 MED ORDER — TRAZODONE HCL 100 MG PO TABS: 100.0000 mg | ORAL_TABLET | Freq: Every evening | ORAL | 2 refills | Status: DC | PRN

## 2020-04-20 MED ORDER — VRAYLAR 6 MG PO CAPS: ORAL_CAPSULE | ORAL | 2 refills | Status: DC

## 2020-04-20 NOTE — Progress Notes (Signed)
[place] MD/PA/NP OP Progress Note  04/20/2020 9:49 AM Glenn Robertson  MRN:  [id number] Interview was conducted by phone and I verified that I was speaking with the correct person using two identifiers. I discussed the limitations of evaluation and management by telemedicine and  the availability of in person appointments. Patient expressed understanding and agreed to proceed. Patient location - home; physician - home office.  Chief Complaint: "I"m OK".  HPI: 54yo singleAAMwith past dx of schizophrenia paranoid type vsschizoaffective disorder. Patient has no hx of suicidal attempts but admits tohx ofon and off SI, he has a long hx of depression and excessive worrying as well as paranoid and grandiose delusions. He expressed havingsome bizarre perceptual disturbances e.g. that he suddenly stops hearing someone who he is talking to or the volume of conversation gradually decreases.He also reports brief, infrequent periods that he loses track of time. He has seen a neurologist for this in the past but "they did not find anything".We continuedclonazepam prn anxiety and insomnia. Dc [name] (no benefit) and insteadaddedVraylarforparanoia.Heappearedless paranoid though still anxious (takes clonazepambid ortiddepending on a day). We have added Prolixin 5 mg at bedtime when he became paranoid last December. He tolerates well and reports decrease of paranoid fears.Denies having AH. His sleepisfair but interrupted(has to go to the bathroom). Appetiteisgood. Hemoglobin A1C decreased to 6.9% and he is proud of this. Plans to participate in a 3 mile run in December.No recent medication changes have been madeand he reports feeling stable.   Visit Diagnosis:    ICD-10-CM   1. Schizoaffective disorder, depressive type ([place])  F25.1 clonazePAM (KLONOPIN) 1 MG tablet  2. Transient alteration of awareness  R40.4 clonazePAM (KLONOPIN) 1 MG tablet    Past Psychiatric History: Please see intake  H&P.  Past Medical History:  Past Medical History:  Diagnosis Date  . Allergy    dog and cats, citrus  . Anxiety   . Asthma   . Chronic pain   . Depression   . Diabetes mellitus   . Glaucoma   . Neuromuscular disorder ([place])   . Paranoid schizophrenia [facility])     Past Surgical History:  Procedure Laterality Date  . ANKLE ARTHROSCOPY    . Arm Surgery  1/12   rt ulnar nerve decompression  . EPIDURAL BLOCK INJECTION     several  . ULNAR NERVE TRANSPOSITION  01/19/2012   Procedure: ULNAR NERVE DECOMPRESSION/TRANSPOSITION;  Surgeon: Cammie Sickle., MD;  Location: [place];  Service: Orthopedics;  Laterality: Left;  ulnar nerve decompression vs transposition left elbow    Family Psychiatric History: None.  Family History:  Family History  Problem Relation Age of Onset  . Diabetes Father   . Diabetes Paternal Uncle   . Colon cancer Neg Hx   . Colon polyps Neg Hx   . Esophageal cancer Neg Hx   . Rectal cancer Neg Hx   . Stomach cancer Neg Hx     Social History:  Social History   Socioeconomic History  . Marital status: Single    Spouse name: Not on file  . Number of children: 0  . Years of education: BA  . Highest education level: Not on file  Occupational History  . Occupation: Unemlpoyed-disabled  Tobacco Use  . Smoking status: Never Smoker  . Smokeless tobacco: Never Used  Vaping Use  . Vaping Use: Never used  Substance and Sexual Activity  . Alcohol use: No  . Drug use: No  . Sexual activity: Not on  file  Other Topics Concern  . Not on file  Social History Narrative   Lives with mother   Caffeine use: Coke very rare   Right handed    Social Determinants of Health   Financial Resource Strain: Low Risk   . Difficulty of Paying Living Expenses: Not hard at all  Food Insecurity: No Food Insecurity  . Worried About Charity fundraiser in the Last Year: Never true  . Ran Out of Food in the Last Year: Never true  Transportation Needs:  No Transportation Needs  . Lack of Transportation (Medical): No  . Lack of Transportation (Non-Medical): No  Physical Activity: Inactive  . Days of Exercise per Week: 0 days  . Minutes of Exercise per Session: 0 min  Stress:   . Feeling of Stress : Not on file  Social Connections: Socially Isolated  . Frequency of Communication with Friends and Family: Once a week  . Frequency of Social Gatherings with Friends and Family: Once a week  . Attends Religious Services: Never  . Active Member of Clubs or Organizations: No  . Attends Archivist Meetings: Never  . Marital Status: Never married    Allergies:  Allergies  Allergen Reactions  . Citric Acid Other (See Comments)    Runny nose, eyes water  . Food     Oranges or anything with citric acid    Metabolic Disorder Labs: Lab Results  Component Value Date   HGBA1C 6.9 (H) 04/06/2020   MPG 123 03/02/2018   No results found for: PROLACTIN Lab Results  Component Value Date   CHOL 96 04/06/2020   TRIG 50.0 04/06/2020   HDL 45.10 04/06/2020   CHOLHDL 2 04/06/2020   VLDL 10.0 04/06/2020   LDLCALC 41 04/06/2020   LDLCALC 50 07/08/2019   Lab Results  Component Value Date   TSH 1.02 07/08/2019   TSH 0.92 03/15/2018    Therapeutic Level Labs: No results found for: LITHIUM No results found for: VALPROATE No components found for:  CBMZ  Current Medications: Current Outpatient Medications  Medication Sig Dispense Refill  . ACCU-CHEK AVIVA PLUS test strip TEST THREE TIMES DAILY 100 strip 5  . ACCU-CHEK SOFTCLIX LANCETS lancets 3 (three) times daily. for testing  0  . albuterol (VENTOLIN HFA) 108 (90 Base) MCG/ACT inhaler INHALE 2 PUFFS BY MOUTH INTO THE LUNGS AS NEEDED 18 g 5  . ASPIRIN 81 PO Take 1 tablet by mouth daily.    Derrill Memo ON 05/02/2020] Cariprazine HCl (VRAYLAR) 6 MG CAPS TAKE 1 CAPSULE(6 MG) BY MOUTH DAILY 30 capsule 2  . [START ON 04/27/2020] clonazePAM (KLONOPIN) 1 MG tablet Take 1 tablet (1 mg total)  by mouth 3 (three) times daily as needed for anxiety. for anxiety 90 tablet 1  . fluPHENAZine (PROLIXIN) 5 MG tablet TAKE 1 TABLET(5 MG) BY MOUTH AT BEDTIME 30 tablet 2  . fluticasone (FLONASE) 50 MCG/ACT nasal spray SHAKE LIQUID AND USE 2 SPRAYS IN EACH NOSTRIL DAILY 16 g 6  . gabapentin (NEURONTIN) 300 MG capsule Take 1 capsule (300 mg total) by mouth 3 (three) times daily. 90 capsule 11  . glipiZIDE (GLUCOTROL) 10 MG tablet TAKE 1 TABLET(10 MG) BY MOUTH TWICE DAILY 180 tablet 3  . JARDIANCE 25 MG TABS tablet TAKE 1 TABLET BY MOUTH DAILY 30 tablet 6  . LUMIGAN 0.01 % SOLN   0  . meloxicam (MOBIC) 15 MG tablet Take 1 tablet (15 mg total) by mouth daily. 30 tablet 3  .  metFORMIN (GLUCOPHAGE-XR) 500 MG 24 hr tablet Take 2 tablets (1,000 mg total) by mouth 2 (two) times daily. 360 tablet 1  . montelukast (SINGULAIR) 10 MG tablet TAKE 1 TABLET(10 MG) BY MOUTH AT BEDTIME 90 tablet 3  . RESTASIS 0.05 % ophthalmic emulsion     . sertraline (ZOLOFT) 100 MG tablet TAKE 1 TABLET(100 MG) BY MOUTH DAILY 30 tablet 2  . sildenafil (REVATIO) 20 MG tablet May take 3-5 tablets daily as needed 45 minutes prior. 50 tablet 1  . simvastatin (ZOCOR) 20 MG tablet TAKE 1 TABLET(20 MG) BY MOUTH EVERY EVENING 90 tablet 3  . tadalafil (CIALIS) 20 MG tablet TAKE ONE-HALF TO ONE TABLET BY MOUTH EVERY OTHER DAY AS NEEDED FOR ERECTILE DYSFUNCTION 10 tablet 4  . traZODone (DESYREL) 100 MG tablet Take 1 tablet (100 mg total) by mouth at bedtime as needed for sleep. 30 tablet 2   No current facility-administered medications for this visit.      Psychiatric Specialty Exam: Review of Systems  Musculoskeletal: Positive for back pain.  Psychiatric/Behavioral: Positive for sleep disturbance.  All other systems reviewed and are negative.   There were no vitals taken for this visit.There is no height or weight on file to calculate BMI.  General Appearance: NA  Eye Contact:  NA  Speech:  Clear and Coherent  Volume:  Normal   Mood:  Some anxiety.  Affect:  NA  Thought Process:  Goal Directed  Orientation:  Full (Time, Place, and Person)  Thought Content: Logical   Suicidal Thoughts:  No  Homicidal Thoughts:  No  Memory:  Immediate;   Good Recent;   Good Remote;   Good  Judgement:  Good  Insight:  Fair  Psychomotor Activity:  NA  Concentration:  Concentration: Good  Recall:  Good  Fund of Knowledge: Good  Language: Good  Akathisia:  Negative  Handed:  Right  AIMS (if indicated): not done  Assets:  Communication Skills Desire for Improvement Financial Resources/Insurance Housing Resilience  ADL's:  Intact  Cognition: WNL  Sleep:  Fair   Screenings: PHQ2-9     Office Visit from 04/06/2020 in [facility] from 02/26/2020 in [facility] [place] Visit from 10/08/2019 in [place] from [facility]/09/[facility] Visit from 10/28/2016 in [place]  PHQ-2 Total Score 1 3 2  0 0  PHQ-9 Total Score -- 11 -- -- --       Assessment and Plan: 54yo singleAAMwith past dx of schizophrenia paranoid type vsschizoaffective disorder. Patient has no hx of suicidal attempts but admits tohx ofon and off SI, he has a long hx of depression and excessive worrying as well as paranoid and grandiose delusions. He expressed havingsome bizarre perceptual disturbances e.g. that he suddenly stops hearing someone who he is talking to or the volume of conversation gradually decreases.He also reports brief, infrequent periods that he loses track of time. He has seen a neurologist for this in the past but "they did not find anything".We continuedclonazepam prn anxiety and insomnia. Dc [name] (no benefit) and insteadaddedVraylarforparanoia.Heappearedless paranoid though still anxious (takes clonazepambid ortiddepending on a day). We have added Prolixin 5 mg at bedtime when  he became paranoid last December. He tolerates well and reports decrease of paranoid fears.Denies having AH. His sleepisfair but interrupted(has to go to the bathroom). Appetiteisgood. Hemoglobin A1C decreased to 6.9% and he is proud of this. Plans to participate in  a 3 mile run in December.No recent medication changes have been madeand he reports feeling stable.   VQ:OHCOBTVM schizophrenia vsSchizoaffective disorder depressed type  Plan: Continue Zoloft 100 mg,Vraylar6mg ,Klonopin 1 mgtid,trazodone 100 mg at HS for sleepand fluphenazine5 mg at HS.Next appointment in2 months.The plan was discussed with patient who had an opportunity to ask questions and these were all answered.I spend 86min in phone consultation with the patient.    Stephanie Acre, MD 04/20/2020, 9:49 AM

## 2020-04-24 ENCOUNTER — Other Ambulatory Visit: Payer: Self-pay | Admitting: Family Medicine

## 2020-05-05 ENCOUNTER — Ambulatory Visit (INDEPENDENT_AMBULATORY_CARE_PROVIDER_SITE_OTHER): Payer: Medicare Other | Admitting: Psychology

## 2020-05-05 DIAGNOSIS — F200 Paranoid schizophrenia: Secondary | ICD-10-CM | POA: Diagnosis not present

## 2020-05-05 DIAGNOSIS — F411 Generalized anxiety disorder: Secondary | ICD-10-CM | POA: Diagnosis not present

## 2020-05-05 DIAGNOSIS — F331 Major depressive disorder, recurrent, moderate: Secondary | ICD-10-CM | POA: Diagnosis not present

## 2020-05-05 DIAGNOSIS — F4310 Post-traumatic stress disorder, unspecified: Secondary | ICD-10-CM

## 2020-05-09 DIAGNOSIS — Z23 Encounter for immunization: Secondary | ICD-10-CM | POA: Diagnosis not present

## 2020-05-12 ENCOUNTER — Encounter (INDEPENDENT_AMBULATORY_CARE_PROVIDER_SITE_OTHER): Payer: Medicare Other | Admitting: Neurology

## 2020-05-12 ENCOUNTER — Ambulatory Visit (INDEPENDENT_AMBULATORY_CARE_PROVIDER_SITE_OTHER): Payer: Medicare Other | Admitting: Neurology

## 2020-05-12 DIAGNOSIS — M5417 Radiculopathy, lumbosacral region: Secondary | ICD-10-CM

## 2020-05-12 DIAGNOSIS — G629 Polyneuropathy, unspecified: Secondary | ICD-10-CM | POA: Insufficient documentation

## 2020-05-12 DIAGNOSIS — Z0289 Encounter for other administrative examinations: Secondary | ICD-10-CM

## 2020-05-12 DIAGNOSIS — R209 Unspecified disturbances of skin sensation: Secondary | ICD-10-CM

## 2020-05-12 NOTE — Progress Notes (Signed)
Full Name: Glenn Robertson Gender: Male MRN #: [id number] Date of Birth: 04/06/1966    Visit Date: 05/12/2020 08:41 Age: 54 Years Examining Physician: Arlice Colt, MD  Referring Physician: Arlice Colt, MD Height: 5 feet 11 inch     History: Glenn Robertson is a 54 year old man with numbness in both feet, left greater than right extending to the ankle, involving the sole more than the dorsum.  Strength was normal.    Nerve conduction studies: The bilateral peroneal and tibial motor responses had normal distal latencies, amplitudes and conduction velocities.  Tibial F-wave latencies were normal.  Bilateral sural and superficial peroneal sensory responses had normal peak latencies and amplitudes.  The galvanic sympathetic skin response was normal in both feet  Electromyography: Needle EMG of selected muscles of the left leg was performed.  There was mild chronic denervation in the vastus medialis, gastrocnemius and abductor hallucis muscles.  There was no abnormal spontaneous activity.  Impression: This NCV/EMG study shows the following: 1.   No evidence of sensory, motor or autonomic polyneuropathy 2.   Mild chronic left S1 radiculopathy  Glenn Robertson A. Felecia Shelling, MD, PhD, FAAN Certified in Neurology, Clinical Neurophysiology, Sleep Medicine, Pain Medicine and Neuroimaging Director, [place] at [facility] [address] [place], [place] [address] [phone]  Clinical note: He is advised to call if symptoms worsen.  If more pain, consider referral to neurosurgery for epidural steroid injection.  If numbness extends higher up in the legs consider cervical MRI to rule out myelopathy.   -- RAS  Verbal informed consent was obtained from the patient, patient was informed of potential risk of procedure, including bruising, bleeding, hematoma formation, infection, muscle weakness, muscle pain, numbness, among  others.         [facility]    Nerve / Sites Muscle Latency Ref. Amplitude Ref. Rel Amp Segments Distance Velocity Ref. Area    ms ms mV mV %  cm m/s m/s mVms  R Peroneal - EDB     Ankle EDB 4.4 ?6.5 4.1 ?2.0 100 Ankle - EDB 9   12.1     Fib head EDB 10.8  3.6  87.6 Fib head - Ankle 31 49 ?44 14.1     Pop fossa EDB 12.9  3.5  98.5 Pop fossa - Fib head 10 46 ?44 12.8         Pop fossa - Ankle      L Peroneal - EDB     Ankle EDB 4.6 ?6.5 4.0 ?2.0 100 Ankle - EDB 9   9.9     Fib head EDB 11.3  3.6  89.3 Fib head - Ankle 29 44 ?44 9.8     Pop fossa EDB 13.5  3.4  95 Pop fossa - Fib head 10 44 ?44 9.4         Pop fossa - Ankle      R Tibial - AH     Ankle AH 4.1 ?5.8 8.9 ?4.0 100 Ankle - AH 9   18.3     Pop fossa AH 13.1  6.9  77.7 Pop fossa - Ankle 40 44 ?41 18.1  L Tibial - AH     Ankle AH 4.2 ?5.8 8.5 ?4.0 100 Ankle - AH 9   16.2     Pop fossa AH 13.2  6.5  76.2 Pop fossa - Ankle 40 44 ?41 17.9  SSR    Nerve / Sites Latency   s  R Sympathetic - Foot     Foot 1.47  L Sympathetic - Foot     Foot 1.64           SNC    Nerve / Sites Rec. Site Peak Lat Ref.  Amp Ref. Segments Distance    ms ms V V  cm  R Sural - Ankle (Calf)     Calf Ankle 3.4 ?4.4 7 ?6 Calf - Ankle 14  L Sural - Ankle (Calf)     Calf Ankle 4.0 ?4.4 7 ?6 Calf - Ankle 14  R Superficial peroneal - Ankle     Lat leg Ankle 3.7 ?4.4 6 ?6 Lat leg - Ankle 14  L Superficial peroneal - Ankle     Lat leg Ankle 4.1 ?4.4 12 ?6 Lat leg - Ankle 14             F  Wave    Nerve F Lat Ref.   ms ms  R Tibial - AH 49.2 ?56.0  L Tibial - AH 50.7 ?56.0          EMG Summary Table    Spontaneous MUAP Recruitment  Muscle IA Fib PSW Fasc Other Amp Dur. Poly Pattern  L. Vastus medialis Normal None None None _______ Normal Normal 2+ Reduced  L. Peroneus longus Normal None None None _______ Normal Normal Normal Normal  L. Tibialis anterior Normal None None None _______ Normal Normal Normal Normal  L. Gastrocnemius  (Medial head) Normal None None None _______ Normal Normal 1+ Reduced  L. Abductor hallucis Normal None None None _______ Normal Normal Normal Normal  L. Glut. Medius Normal None None None _______ Normal Normal 1+ Normal  L. Iliopsoas Normal None None None _______ Normal Normal Normal Normal

## 2020-05-13 ENCOUNTER — Other Ambulatory Visit: Payer: Self-pay | Admitting: Family Medicine

## 2020-05-15 ENCOUNTER — Other Ambulatory Visit: Payer: Self-pay | Admitting: Family Medicine

## 2020-05-16 ENCOUNTER — Other Ambulatory Visit: Payer: Self-pay | Admitting: Family Medicine

## 2020-05-18 ENCOUNTER — Other Ambulatory Visit: Payer: Self-pay | Admitting: Family Medicine

## 2020-05-18 ENCOUNTER — Telehealth: Payer: Self-pay

## 2020-05-18 ENCOUNTER — Telehealth: Payer: Self-pay | Admitting: Physical Medicine and Rehabilitation

## 2020-05-18 NOTE — Telephone Encounter (Signed)
Patient called and returned call to [facility]. Patient states he can do appt for 1300 hours on 10/4. Patient asked for a call back to confirm time and date. Patient phone number is [phone].

## 2020-05-18 NOTE — Telephone Encounter (Signed)
[place] but if complicated then OV with referring

## 2020-05-18 NOTE — Telephone Encounter (Signed)
Patient will call back to let me know if he has a ride on 10/4 at either 0815 or 1300.

## 2020-05-18 NOTE — Telephone Encounter (Signed)
Patient called he wants to schedule an appointment with Dr.Newton call [phone].

## 2020-05-18 NOTE — Telephone Encounter (Signed)
Right S1 TF 11/06/19. Ok to repeat if helped, same problem/side, and no new injury?

## 2020-05-19 NOTE — Telephone Encounter (Signed)
Forward to PCP for review

## 2020-05-19 NOTE — Telephone Encounter (Signed)
Scheduled for 10/4 at 1300 with driver.

## 2020-06-01 ENCOUNTER — Ambulatory Visit: Payer: Self-pay

## 2020-06-01 ENCOUNTER — Other Ambulatory Visit: Payer: Self-pay

## 2020-06-01 ENCOUNTER — Ambulatory Visit (INDEPENDENT_AMBULATORY_CARE_PROVIDER_SITE_OTHER): Payer: Medicare Other | Admitting: Physical Medicine and Rehabilitation

## 2020-06-01 VITALS — BP 99/67 | HR 77

## 2020-06-01 DIAGNOSIS — M5116 Intervertebral disc disorders with radiculopathy, lumbar region: Secondary | ICD-10-CM | POA: Diagnosis not present

## 2020-06-01 MED ORDER — METHYLPREDNISOLONE ACETATE 80 MG/ML IJ SUSP
80.0000 mg | Freq: Once | INTRAMUSCULAR | Status: AC
  Administered 2020-06-01: 80 mg

## 2020-06-01 NOTE — Progress Notes (Signed)
Pt states lower back pain that travels down both hip. Pt state pain in his left hamstring and neck. Pt state waking up and getting out of bed is when the pain hurts the most. Pt state ice/heating for the pain and it didn't work. Pt state he cant walking, standing or ride a bike anymore.  Pt has hx of inj on 11/06/19 Pt state it worked for a while because he was able to run.  Numeric Pain Rating Scale and Functional Assessment Average Pain 4   In the last MONTH (on 0-10 scale) has pain interfered with the following?  1. General activity like being  able to carry out your everyday physical activities such as walking, climbing stairs, carrying groceries, or moving a chair?  Rating(6)   +Driver, -BT, -Dye Allergies.

## 2020-06-03 ENCOUNTER — Ambulatory Visit (INDEPENDENT_AMBULATORY_CARE_PROVIDER_SITE_OTHER): Payer: Medicare Other | Admitting: Psychology

## 2020-06-03 DIAGNOSIS — F411 Generalized anxiety disorder: Secondary | ICD-10-CM | POA: Diagnosis not present

## 2020-06-03 DIAGNOSIS — F200 Paranoid schizophrenia: Secondary | ICD-10-CM | POA: Diagnosis not present

## 2020-06-03 DIAGNOSIS — F4310 Post-traumatic stress disorder, unspecified: Secondary | ICD-10-CM

## 2020-06-03 DIAGNOSIS — F331 Major depressive disorder, recurrent, moderate: Secondary | ICD-10-CM

## 2020-06-04 NOTE — Progress Notes (Signed)
Glenn Robertson - 54 y.o. male MRN [id number]  Date of birth: 1966-03-11  Office Visit Note: Visit Date: 06/01/2020 PCP: Libby Maw, MD Referred by: Libby Maw,*  Subjective: Chief Complaint  Patient presents with  . Lower Back - Pain  . Left Hip - Pain  . Right Hip - Pain   HPI:  Robertson Glenn is a 54 y.o. male who comes in today at the request of Dr. Rodell Perna for planned Bilateral S1-2 Lumbar epidural steroid injection with fluoroscopic guidance.  The patient has failed conservative care including home exercise, medications, time and activity modification.  This injection will be diagnostic and hopefully therapeutic.  Please see requesting physician notes for further details and justification.  MRI reviewed with images and spine model.  MRI reviewed in the note below.  Similar injection performed in March did give him diagnostic and therapeutic relief.  He has been exercising more and having a lot of increasing back pain over the last several weeks.  This is not responded to his normal conservative care.  He reports worsening with getting up and walking and try to get out of bed.  Does use heat and ice and medication.  His case is complicated by history of type 2 diabetes as well as paranoid schizophrenia or schizoaffective disorder.  Since I have seen him he has had electrodiagnostic study of both lower limbs by Dr. Arlice Colt.  This did not show any polyneuropathy.  It did show a chronic left S1 radiculopathy.  ROS Otherwise per HPI.  Assessment & Plan: Visit Diagnoses:  1. Radiculopathy due to lumbar intervertebral disc disorder     Plan: No additional findings.   Meds & Orders:  Meds ordered this encounter  Medications  . methylPREDNISolone acetate (DEPO-MEDROL) injection 80 mg    Orders Placed This Encounter  Procedures  . XR C-ARM NO REPORT  . Epidural Steroid injection    Follow-up: Return if symptoms worsen or fail to improve.    Procedures: No procedures performed  S1 Lumbosacral Transforaminal Epidural Steroid Injection - Sub-Pedicular Approach with Fluoroscopic Guidance   Patient: Glenn Robertson      Date of Birth: 26-Apr-1966 MRN: [id number] PCP: Libby Maw, MD      Visit Date: 06/01/2020   Universal Protocol:    Date/Time: 10/07/215:37 AM  Consent Given By: the patient  Position:  PRONE  Additional Comments: Vital signs were monitored before and after the procedure. Patient was prepped and draped in the usual sterile fashion. The correct patient, procedure, and site was verified.   Injection Procedure Details:  Procedure Site One Meds Administered:  Meds ordered this encounter  Medications  . methylPREDNISolone acetate (DEPO-MEDROL) injection 80 mg    Laterality: Bilateral  Location/Site:  S1 Foramen   Needle size: 22 ga.  Needle type: Spinal  Needle Placement: Transforaminal  Findings:   -Comments: Excellent flow of contrast along the nerve and into the epidural space.  Epidurogram: Contrast epidurogram showed no nerve root cut off or restricted flow pattern.  Procedure Details: After squaring off the sacral end-plate to get a true AP view, the C-arm was positioned so that the best possible view of the S1 foramen was visualized. The soft tissues overlying this structure were infiltrated with 2-3 ml. of 1% Lidocaine without Epinephrine.    The spinal needle was inserted toward the target using a "trajectory" view along the fluoroscope beam.  Under AP and lateral visualization, the needle was advanced so it did not  puncture dura. Biplanar projections were used to confirm position. Aspiration was confirmed to be negative for CSF and/or blood. A 1-2 ml. volume of Isovue-250 was injected and flow of contrast was noted at each level. Radiographs were obtained for documentation purposes.   After attaining the desired flow of contrast documented above, a 0.5 to 1.0 ml test dose  of 0.25% Marcaine was injected into each respective transforaminal space.  The patient was observed for 90 seconds post injection.  After no sensory deficits were reported, and normal lower extremity motor function was noted,   the above injectate was administered so that equal amounts of the injectate were placed at each foramen (level) into the transforaminal epidural space.   Additional Comments:  The patient tolerated the procedure well Dressing: Band-Aid with 2 x 2 sterile gauze    Post-procedure details: Patient was observed during the procedure. Post-procedure instructions were reviewed.  Patient left the clinic in stable condition.     Clinical History: 05/12/2020 electrodiagnostic study: Impression: This NCV/EMG study shows the following: 1.   No evidence of sensory, motor or autonomic polyneuropathy 2.   Mild chronic left S1 radiculopathy  Richard A. Felecia Shelling, MD, PhD, FAAN Certified in Neurology, Clinical Neurophysiology, Sleep Medicine, Pain Medicine and Neuroimaging Director, [place] at [facility] [address], [place], [place] [address] ----------------------  MRI [place]:  Multiplanar, multisequence MR imaging of the lumbar spine was  performed. No intravenous contrast was administered.    COMPARISON: 02/16/2017    FINDINGS:  Segmentation: Standard.    Alignment: Physiologic.    Vertebrae: No fracture, evidence of discitis, or bone lesion.    Conus medullaris and cauda equina: Conus extends to the L1 level.  Conus and cauda equina appear normal.    Paraspinal and other soft tissues: No acute paraspinal abnormality.    Other: Mild osteoarthritis of bilateral sacroiliac joints.    Disc levels:    Disc spaces: Degenerative disease with disc height loss at L5-S1.    T12-L1: No significant disc bulge. No evidence of neural  foraminal  stenosis. No central canal stenosis.    L1-L2: No significant disc bulge. No evidence of neural foraminal  stenosis. No central canal stenosis.    L2-L3: Minimal broad-based disc bulge. No evidence of neural  foraminal stenosis. No central canal stenosis.    L3-L4: Mild broad-based disc bulge. No evidence of neural foraminal  stenosis. No central canal stenosis.    L4-L5: Mild broad-based disc bulge. No evidence of neural foraminal  stenosis. No central canal stenosis.    L5-S1: Broad disc protrusion eccentric towards the left. Disc abuts  bilateral S1 nerve roots. No evidence of neural foraminal stenosis.  No central canal stenosis.    IMPRESSION:  1. At L5-S1 there is a broad disc protrusion eccentric towards the  left with the disc abutting bilateral S1 nerve roots.      Electronically Signed  By: Kathreen Devoid  On: 11/25/2017 10:39     Objective:  VS:  HT:    WT:   BMI:     BP:99/67  HR:77bpm  TEMP: ( )  RESP:  Physical Exam Constitutional:      General: He is not in acute distress.    Appearance: Normal appearance. He is not ill-appearing.  HENT:     Head: Normocephalic and atraumatic.     Right Ear: External ear normal.     Left  Ear: External ear normal.  Eyes:     Extraocular Movements: Extraocular movements intact.  Cardiovascular:     Rate and Rhythm: Normal rate.     Pulses: Normal pulses.  Abdominal:     General: There is no distension.     Palpations: Abdomen is soft.  Musculoskeletal:        General: No tenderness or signs of injury.     Right lower leg: No edema.     Left lower leg: No edema.     Comments: Patient has good distal strength without clonus.  Skin:    Findings: No erythema or rash.  Neurological:     General: No focal deficit present.     Mental Status: He is alert and oriented to person, place, and time.     Sensory: No sensory deficit.     Motor: No weakness or abnormal muscle tone.     Coordination:  Coordination normal.  Psychiatric:        Mood and Affect: Mood normal.        Behavior: Behavior normal.      Imaging: No results found.

## 2020-06-04 NOTE — Procedures (Signed)
S1 Lumbosacral Transforaminal Epidural Steroid Injection - Sub-Pedicular Approach with Fluoroscopic Guidance   Patient: Glenn Robertson      Date of Birth: 15-Oct-1965 MRN: [id number] PCP: Libby Maw, MD      Visit Date: 06/01/2020   Universal Protocol:    Date/Time: 10/07/215:37 AM  Consent Given By: the patient  Position:  PRONE  Additional Comments: Vital signs were monitored before and after the procedure. Patient was prepped and draped in the usual sterile fashion. The correct patient, procedure, and site was verified.   Injection Procedure Details:  Procedure Site One Meds Administered:  Meds ordered this encounter  Medications  . methylPREDNISolone acetate (DEPO-MEDROL) injection 80 mg    Laterality: Bilateral  Location/Site:  S1 Foramen   Needle size: 22 ga.  Needle type: Spinal  Needle Placement: Transforaminal  Findings:   -Comments: Excellent flow of contrast along the nerve and into the epidural space.  Epidurogram: Contrast epidurogram showed no nerve root cut off or restricted flow pattern.  Procedure Details: After squaring off the sacral end-plate to get a true AP view, the C-arm was positioned so that the best possible view of the S1 foramen was visualized. The soft tissues overlying this structure were infiltrated with 2-3 ml. of 1% Lidocaine without Epinephrine.    The spinal needle was inserted toward the target using a "trajectory" view along the fluoroscope beam.  Under AP and lateral visualization, the needle was advanced so it did not puncture dura. Biplanar projections were used to confirm position. Aspiration was confirmed to be negative for CSF and/or blood. A 1-2 ml. volume of Isovue-250 was injected and flow of contrast was noted at each level. Radiographs were obtained for documentation purposes.   After attaining the desired flow of contrast documented above, a 0.5 to 1.0 ml test dose of 0.25% Marcaine was injected into each  respective transforaminal space.  The patient was observed for 90 seconds post injection.  After no sensory deficits were reported, and normal lower extremity motor function was noted,   the above injectate was administered so that equal amounts of the injectate were placed at each foramen (level) into the transforaminal epidural space.   Additional Comments:  The patient tolerated the procedure well Dressing: Band-Aid with 2 x 2 sterile gauze    Post-procedure details: Patient was observed during the procedure. Post-procedure instructions were reviewed.  Patient left the clinic in stable condition.

## 2020-06-15 ENCOUNTER — Other Ambulatory Visit: Payer: Self-pay | Admitting: Family Medicine

## 2020-06-22 ENCOUNTER — Other Ambulatory Visit: Payer: Self-pay

## 2020-06-22 ENCOUNTER — Telehealth (INDEPENDENT_AMBULATORY_CARE_PROVIDER_SITE_OTHER): Payer: Medicare Other | Admitting: Psychiatry

## 2020-06-22 DIAGNOSIS — R404 Transient alteration of awareness: Secondary | ICD-10-CM

## 2020-06-22 DIAGNOSIS — F200 Paranoid schizophrenia: Secondary | ICD-10-CM | POA: Diagnosis not present

## 2020-06-22 MED ORDER — FLUPHENAZINE HCL 5 MG PO TABS: ORAL_TABLET | ORAL | 5 refills | Status: DC

## 2020-06-22 MED ORDER — VRAYLAR 6 MG PO CAPS
ORAL_CAPSULE | ORAL | 5 refills | Status: DC
Start: 2020-06-29 — End: 2020-11-24

## 2020-06-22 MED ORDER — TRAZODONE HCL 100 MG PO TABS: 100.0000 mg | ORAL_TABLET | Freq: Every evening | ORAL | 5 refills | Status: DC | PRN

## 2020-06-22 MED ORDER — SERTRALINE HCL 100 MG PO TABS
ORAL_TABLET | ORAL | 5 refills | Status: DC
Start: 2020-06-22 — End: 2020-07-12

## 2020-06-22 MED ORDER — CLONAZEPAM 1 MG PO TABS: 1.0000 mg | ORAL_TABLET | Freq: Three times a day (TID) | ORAL | 2 refills | Status: DC | PRN

## 2020-06-22 NOTE — Progress Notes (Addendum)
[place] MD/PA/NP OP Progress Note  06/22/2020 9:46 AM Glenn Robertson  MRN:  [id number] Interview was conducted by phone and I verified that I was speaking with the correct person using two identifiers. I discussed the limitations of evaluation and management by telemedicine and  the availability of in person appointments. Patient expressed understanding and agreed to proceed. Patient location - home; physician - home office.  Chief Complaint: Back pain, anxiety.  HPI: 54yo singleAAMwith past dx of schizophrenia paranoid type vsschizoaffective disorder. Patient has no hx of suicidal attempts but admits tohx ofon and off SI, he has a long hx of depression and excessive worrying as well as paranoid and grandiose delusions. He expressed havingsome bizarre perceptual disturbances e.g. that he suddenly stops hearing someone who he is talking to or the volume of conversation gradually decreases.He also reports brief, infrequent periods that he loses track of time. He has seen a neurologist for this in the past but "they did not find anything".We continuedclonazepam prn anxiety. Dc [name] (no benefit) and insteadaddedVraylarforparanoia.Heappearedless paranoid though still anxious (takes clonazepambid ortiddepending on a day).We have added Prolixin 5 mg at bedtimewhen he became paranoid last December. Hetolerates well and reports decrease of paranoid fears.Denies having AH, tremor, difficulty swallowing. His sleepisfair - rarely uses trazodone. Hemoglobin A1C decreased to 6.9%, no changes in appetite. No recent medication changes have been madeand he reports feeling stable.     Visit Diagnosis:    ICD-10-CM   1. Paranoid schizophrenia, chronic condition ([place])  F20.0   2. Transient alteration of awareness  R40.4 clonazePAM (KLONOPIN) 1 MG tablet    Past Psychiatric History: Please see intake H&P.  Past Medical History:  Past Medical History:  Diagnosis Date  . Allergy    dog  and cats, citrus  . Anxiety   . Asthma   . Chronic pain   . Depression   . Diabetes mellitus   . Glaucoma   . Neuromuscular disorder ([place])   . Paranoid schizophrenia [facility])     Past Surgical History:  Procedure Laterality Date  . ANKLE ARTHROSCOPY    . Arm Surgery  1/12   rt ulnar nerve decompression  . EPIDURAL BLOCK INJECTION     several  . ULNAR NERVE TRANSPOSITION  01/19/2012   Procedure: ULNAR NERVE DECOMPRESSION/TRANSPOSITION;  Surgeon: Cammie Sickle., MD;  Location: [place];  Service: Orthopedics;  Laterality: Left;  ulnar nerve decompression vs transposition left elbow    Family Psychiatric History: None.  Family History:  Family History  Problem Relation Age of Onset  . Diabetes Father   . Diabetes Paternal Uncle   . Colon cancer Neg Hx   . Colon polyps Neg Hx   . Esophageal cancer Neg Hx   . Rectal cancer Neg Hx   . Stomach cancer Neg Hx     Social History:  Social History   Socioeconomic History  . Marital status: Single    Spouse name: Not on file  . Number of children: 0  . Years of education: BA  . Highest education level: Not on file  Occupational History  . Occupation: Unemlpoyed-disabled  Tobacco Use  . Smoking status: Never Smoker  . Smokeless tobacco: Never Used  Vaping Use  . Vaping Use: Never used  Substance and Sexual Activity  . Alcohol use: No  . Drug use: No  . Sexual activity: Not on file  Other Topics Concern  . Not on file  Social History Narrative   Lives with  mother   Caffeine use: Coke very rare   Right handed    Social Determinants of Health   Financial Resource Strain: Low Risk   . Difficulty of Paying Living Expenses: Not hard at all  Food Insecurity: No Food Insecurity  . Worried About Charity fundraiser in the Last Year: Never true  . Ran Out of Food in the Last Year: Never true  Transportation Needs: No Transportation Needs  . Lack of Transportation (Medical): No  . Lack of Transportation  (Non-Medical): No  Physical Activity: Inactive  . Days of Exercise per Week: 0 days  . Minutes of Exercise per Session: 0 min  Stress:   . Feeling of Stress : Not on file  Social Connections: Socially Isolated  . Frequency of Communication with Friends and Family: Once a week  . Frequency of Social Gatherings with Friends and Family: Once a week  . Attends Religious Services: Never  . Active Member of Clubs or Organizations: No  . Attends Archivist Meetings: Never  . Marital Status: Never married    Allergies:  Allergies  Allergen Reactions  . Citric Acid Other (See Comments)    Runny nose, eyes water  . Food     Oranges or anything with citric acid    Metabolic Disorder Labs: Lab Results  Component Value Date   HGBA1C 6.9 (H) 04/06/2020   MPG 123 03/02/2018   No results found for: PROLACTIN Lab Results  Component Value Date   CHOL 96 04/06/2020   TRIG 50.0 04/06/2020   HDL 45.10 04/06/2020   CHOLHDL 2 04/06/2020   VLDL 10.0 04/06/2020   LDLCALC 41 04/06/2020   LDLCALC 50 07/08/2019   Lab Results  Component Value Date   TSH 1.02 07/08/2019   TSH 0.92 03/15/2018    Therapeutic Level Labs: No results found for: LITHIUM No results found for: VALPROATE No components found for:  CBMZ  Current Medications: Current Outpatient Medications  Medication Sig Dispense Refill  . ACCU-CHEK AVIVA PLUS test strip TEST THREE TIMES DAILY 100 strip 5  . ACCU-CHEK SOFTCLIX LANCETS lancets 3 (three) times daily. for testing  0  . albuterol (VENTOLIN HFA) 108 (90 Base) MCG/ACT inhaler INHALE 2 PUFFS BY MOUTH INTO THE LUNGS AS NEEDED 18 g 5  . ASPIRIN 81 PO Take 1 tablet by mouth daily.    Derrill Memo ON 06/29/2020] Cariprazine HCl (VRAYLAR) 6 MG CAPS TAKE 1 CAPSULE(6 MG) BY MOUTH DAILY 30 capsule 5  . [START ON 06/29/2020] clonazePAM (KLONOPIN) 1 MG tablet Take 1 tablet (1 mg total) by mouth 3 (three) times daily as needed for anxiety. for anxiety 90 tablet 2  .  fluPHENAZine (PROLIXIN) 5 MG tablet TAKE 1 TABLET(5 MG) BY MOUTH AT BEDTIME 30 tablet 5  . fluticasone (FLONASE) 50 MCG/ACT nasal spray SHAKE LIQUID AND USE 2 SPRAYS IN EACH NOSTRIL DAILY 16 g 6  . gabapentin (NEURONTIN) 300 MG capsule Take 1 capsule (300 mg total) by mouth 3 (three) times daily. 90 capsule 11  . glipiZIDE (GLUCOTROL) 10 MG tablet TAKE 1 TABLET(10 MG) BY MOUTH TWICE DAILY 180 tablet 3  . JARDIANCE 25 MG TABS tablet TAKE 1 TABLET BY MOUTH DAILY 90 tablet 2  . LUMIGAN 0.01 % SOLN   0  . meloxicam (MOBIC) 15 MG tablet Take 1 tablet (15 mg total) by mouth daily. 30 tablet 3  . metFORMIN (GLUCOPHAGE-XR) 500 MG 24 hr tablet Take 2 tablets (1,000 mg total) by mouth 2 (  two) times daily. 360 tablet 1  . montelukast (SINGULAIR) 10 MG tablet TAKE 1 TABLET(10 MG) BY MOUTH AT BEDTIME 90 tablet 3  . RESTASIS 0.05 % ophthalmic emulsion     . sertraline (ZOLOFT) 100 MG tablet TAKE 1 TABLET(100 MG) BY MOUTH DAILY 30 tablet 5  . sildenafil (REVATIO) 20 MG tablet May take 3-5 tablets daily as needed 45 minutes prior. 50 tablet 1  . simvastatin (ZOCOR) 20 MG tablet TAKE 1 TABLET(20 MG) BY MOUTH EVERY EVENING 90 tablet 3  . tadalafil (CIALIS) 20 MG tablet TAKE ONE-HALF TO ONE TABLET BY MOUTH EVERY OTHER DAY AS NEEDED FOR FOR ERECTILE DYSFUNCTION 10 tablet 4  . [START ON 06/29/2020] traZODone (DESYREL) 100 MG tablet Take 1 tablet (100 mg total) by mouth at bedtime as needed for sleep. 30 tablet 5   No current facility-administered medications for this visit.    Psychiatric Specialty Exam: Review of Systems  Musculoskeletal: Positive for back pain.  Psychiatric/Behavioral: The patient is nervous/anxious.   All other systems reviewed and are negative.   There were no vitals taken for this visit.There is no height or weight on file to calculate BMI.  General Appearance: NA  Eye Contact:  NA  Speech:  Clear and Coherent and Normal Rate  Volume:  Normal  Mood:  Anxious  Affect:  NA  Thought  Process:  Goal Directed and Linear  Orientation:  Full (Time, Place, and Person)  Thought Content: Logical   Suicidal Thoughts:  No  Homicidal Thoughts:  No  Memory:  Immediate;   Good Recent;   Good Remote;   Good  Judgement:  Good  Insight:  Fair  Psychomotor Activity:  NA  Concentration:  Concentration: Good  Recall:  Good  Fund of Knowledge: Good  Language: Good  Akathisia:  Negative  Handed:  Right  AIMS (if indicated): not done  Assets:  Communication Skills Desire for Improvement Financial Resources/Insurance Housing Resilience  ADL's:  Intact  Cognition: WNL  Sleep:  Fair   Screenings: PHQ2-9     Office Visit from 04/06/2020 in [facility] from 02/26/2020 in [facility] Visit from 10/08/2019 in [place] from [facility] Visit from 10/28/2016 in [place]  PHQ-2 Total Score 1 3 2  0 0  PHQ-9 Total Score -- 11 -- -- --       Assessment and Plan: 54yo singleAAMwith schizophrenia paranoid type vsschizoaffective disorder. Patient has no hx of suicidal attempts but admits tohx ofon and off SI, he has a long hx of depression and excessive worrying as well as paranoid and grandiose delusions. He expressed havingsome bizarre perceptual disturbances e.g. that he suddenly stops hearing someone who he is talking to or the volume of conversation gradually decreases.He also reports brief, infrequent periods that he loses track of time. He has seen a neurologist for this in the past but "they did not find anything".We continuedclonazepam prn anxiety. Dc [name] (no benefit) and insteadaddedVraylarforparanoia.Heappearedless paranoid though still anxious (takes clonazepambid ortiddepending on a day).We have added Prolixin 5 mg at bedtimewhen he became paranoid last December. Hetolerates well and  reports decrease of paranoid fears.Denies having AH, tremor, difficulty swallowing. His sleepisfair - rarely uses trazodone. Hemoglobin A1C decreased to 6.9%, no changes in appetite. No recent medication changes have been madeand he reports feeling stable.   VZ:DGLOVFIE schizophrenia  Plan: Continue Zoloft 100 mg,Vraylar6mg ,Klonopin 1 mgtid,trazodone 100  mg at HS for sleepand fluphenazine5 mg at HS.Next appointment inapproximately 2 months.The plan was discussed with patient who had an opportunity to ask questions and these were all answered.I spend20min in phone consultation with the patient.    Stephanie Acre, MD 06/22/2020, 9:46 AM

## 2020-06-25 DIAGNOSIS — H401231 Low-tension glaucoma, bilateral, mild stage: Secondary | ICD-10-CM | POA: Diagnosis not present

## 2020-06-25 DIAGNOSIS — H04123 Dry eye syndrome of bilateral lacrimal glands: Secondary | ICD-10-CM | POA: Diagnosis not present

## 2020-06-25 DIAGNOSIS — H2513 Age-related nuclear cataract, bilateral: Secondary | ICD-10-CM | POA: Diagnosis not present

## 2020-06-25 DIAGNOSIS — H43813 Vitreous degeneration, bilateral: Secondary | ICD-10-CM | POA: Diagnosis not present

## 2020-07-01 ENCOUNTER — Ambulatory Visit (INDEPENDENT_AMBULATORY_CARE_PROVIDER_SITE_OTHER): Payer: Medicare Other | Admitting: Psychology

## 2020-07-01 DIAGNOSIS — F200 Paranoid schizophrenia: Secondary | ICD-10-CM | POA: Diagnosis not present

## 2020-07-01 DIAGNOSIS — F411 Generalized anxiety disorder: Secondary | ICD-10-CM

## 2020-07-01 DIAGNOSIS — F331 Major depressive disorder, recurrent, moderate: Secondary | ICD-10-CM

## 2020-07-01 DIAGNOSIS — F4310 Post-traumatic stress disorder, unspecified: Secondary | ICD-10-CM

## 2020-07-02 DIAGNOSIS — Z23 Encounter for immunization: Secondary | ICD-10-CM | POA: Diagnosis not present

## 2020-07-11 ENCOUNTER — Other Ambulatory Visit (HOSPITAL_COMMUNITY): Payer: Self-pay | Admitting: Psychiatry

## 2020-07-27 ENCOUNTER — Telehealth: Payer: Self-pay | Admitting: Neurology

## 2020-07-27 ENCOUNTER — Ambulatory Visit (INDEPENDENT_AMBULATORY_CARE_PROVIDER_SITE_OTHER): Payer: Medicare Other | Admitting: Psychology

## 2020-07-27 DIAGNOSIS — F411 Generalized anxiety disorder: Secondary | ICD-10-CM | POA: Diagnosis not present

## 2020-07-27 DIAGNOSIS — F331 Major depressive disorder, recurrent, moderate: Secondary | ICD-10-CM

## 2020-07-27 DIAGNOSIS — F4310 Post-traumatic stress disorder, unspecified: Secondary | ICD-10-CM | POA: Diagnosis not present

## 2020-07-27 MED ORDER — LAMOTRIGINE 100 MG PO TABS: ORAL_TABLET | ORAL | 3 refills | Status: DC

## 2020-07-27 NOTE — Telephone Encounter (Signed)
Called pt back. He has been on gabapentin for about a couple months. Sx started when he started medication. He would like to come off of this med. I placed him on hold and spoke with MD. Per Dr. Felecia Shelling: he can stop gabapentin. Suggests having him try lamotrigine instead. Directions: Take 1/2 tablet by mouth daily for 5 days. Then 1/2 tablet twice daily for 5 days. Then 1/2 tablet three times daily for 5 days. Then 1 tablet twice daily thereafter. #60, 3 refills. I relayed this to pt. Pt agreeable to this plan. I e-scribed rx lamotrigine to [facility] per pt request. He will slowly come off gabapentin and then start lamotrigine.

## 2020-07-27 NOTE — Telephone Encounter (Signed)
Pt called, need to be taken off of gabapentin (NEURONTIN) 300 MG capsule. I have been walking into things, hard to maintain balance, having falls. Experiencing since I have been on Gabapentin. Would like a call from the nurse.

## 2020-08-06 ENCOUNTER — Telehealth (HOSPITAL_COMMUNITY): Payer: Self-pay | Admitting: *Deleted

## 2020-08-06 NOTE — Telephone Encounter (Signed)
Opened in error

## 2020-09-04 ENCOUNTER — Telehealth (INDEPENDENT_AMBULATORY_CARE_PROVIDER_SITE_OTHER): Payer: Medicare Other | Admitting: Psychiatry

## 2020-09-04 ENCOUNTER — Other Ambulatory Visit: Payer: Self-pay

## 2020-09-04 DIAGNOSIS — F200 Paranoid schizophrenia: Secondary | ICD-10-CM

## 2020-09-04 NOTE — Progress Notes (Signed)
[place] MD/PA/NP OP Progress Note  09/04/2020 8:44 AM Glenn Robertson  MRN:  [id number] Interview was conducted by phone and I verified that I was speaking with the correct person using two identifiers. I discussed the limitations of evaluation and management by telemedicine and  the availability of in person appointments. Patient expressed understanding and agreed to proceed. Participants in the visit: patient (location - home); physician (location - home office).  Chief Complaint: "Nothing new, I am doing well".  HPI: 55yo singleAAMwith schizophrenia paranoid type vsschizoaffective disorder. Patient has no hx of suicidal attempts but admits tohx ofon and off SI, he has a long hx of depression and excessive worrying as well as paranoid and grandiose delusions. He expressed havingsome bizarre perceptual disturbances e.g. that he suddenly stops hearing someone who he is talking to or the volume of conversation gradually decreases.He also reports brief, infrequent periods that he loses track of time. He has seen a neurologist for this in the past but "they did not find anything".We continuedclonazepam prn anxiety. Dc [name] (no benefit) and insteadaddedVraylarforparanoia.Heappearedless paranoid though still anxious (takes clonazepambid ortiddepending on a day).We have added Prolixin 5 mg at bedtimewhen he became paranoid last December. Hetolerates well and reports decrease of paranoid fears.Denies having AH, tremor, difficulty swallowing. His sleepisfair - typically uses 50 mg of trazodone.Hemoglobin A1C decreased to 6.9%, no changes in appetite. Recently switched from gabapentin to lamotrigine for polyneuropathic pain.   Visit Diagnosis:    ICD-10-CM   1. Paranoid schizophrenia, chronic condition ([place])  F20.0     Past Psychiatric History: Please see intake H&P.  Past Medical History:  Past Medical History:  Diagnosis Date  . Allergy    dog and cats, citrus  . Anxiety   .  Asthma   . Chronic pain   . Depression   . Diabetes mellitus   . Glaucoma   . Neuromuscular disorder ([place])   . Paranoid schizophrenia [facility])     Past Surgical History:  Procedure Laterality Date  . ANKLE ARTHROSCOPY    . Arm Surgery  1/12   rt ulnar nerve decompression  . EPIDURAL BLOCK INJECTION     several  . ULNAR NERVE TRANSPOSITION  01/19/2012   Procedure: ULNAR NERVE DECOMPRESSION/TRANSPOSITION;  Surgeon: Cammie Sickle., MD;  Location: [place];  Service: Orthopedics;  Laterality: Left;  ulnar nerve decompression vs transposition left elbow    Family Psychiatric History: None.  Family History:  Family History  Problem Relation Age of Onset  . Diabetes Father   . Diabetes Paternal Uncle   . Colon cancer Neg Hx   . Colon polyps Neg Hx   . Esophageal cancer Neg Hx   . Rectal cancer Neg Hx   . Stomach cancer Neg Hx     Social History:  Social History   Socioeconomic History  . Marital status: Single    Spouse name: Not on file  . Number of children: 0  . Years of education: BA  . Highest education level: Not on file  Occupational History  . Occupation: Unemlpoyed-disabled  Tobacco Use  . Smoking status: Never Smoker  . Smokeless tobacco: Never Used  Vaping Use  . Vaping Use: Never used  Substance and Sexual Activity  . Alcohol use: No  . Drug use: No  . Sexual activity: Not on file  Other Topics Concern  . Not on file  Social History Narrative   Lives with mother   Caffeine use: Coke very rare  Right handed    Social Determinants of Health   Financial Resource Strain: Low Risk   . Difficulty of Paying Living Expenses: Not hard at all  Food Insecurity: No Food Insecurity  . Worried About Charity fundraiser in the Last Year: Never true  . Ran Out of Food in the Last Year: Never true  Transportation Needs: No Transportation Needs  . Lack of Transportation (Medical): No  . Lack of Transportation (Non-Medical): No  Physical  Activity: Inactive  . Days of Exercise per Week: 0 days  . Minutes of Exercise per Session: 0 min  Stress: Not on file  Social Connections: Socially Isolated  . Frequency of Communication with Friends and Family: Once a week  . Frequency of Social Gatherings with Friends and Family: Once a week  . Attends Religious Services: Never  . Active Member of Clubs or Organizations: No  . Attends Archivist Meetings: Never  . Marital Status: Never married    Allergies:  Allergies  Allergen Reactions  . Citric Acid Other (See Comments)    Runny nose, eyes water  . Food     Oranges or anything with citric acid  . Gabapentin     Walking into things, hard to maintain balance, falls    Metabolic Disorder Labs: Lab Results  Component Value Date   HGBA1C 6.9 (H) 04/06/2020   MPG 123 03/02/2018   No results found for: PROLACTIN Lab Results  Component Value Date   CHOL 96 04/06/2020   TRIG 50.0 04/06/2020   HDL 45.10 04/06/2020   CHOLHDL 2 04/06/2020   VLDL 10.0 04/06/2020   LDLCALC 41 04/06/2020   LDLCALC 50 07/08/2019   Lab Results  Component Value Date   TSH 1.02 07/08/2019   TSH 0.92 03/15/2018    Therapeutic Level Labs: No results found for: LITHIUM No results found for: VALPROATE No components found for:  CBMZ  Current Medications: Current Outpatient Medications  Medication Sig Dispense Refill  . ACCU-CHEK AVIVA PLUS test strip TEST THREE TIMES DAILY 100 strip 5  . ACCU-CHEK SOFTCLIX LANCETS lancets 3 (three) times daily. for testing  0  . albuterol (VENTOLIN HFA) 108 (90 Base) MCG/ACT inhaler INHALE 2 PUFFS BY MOUTH INTO THE LUNGS AS NEEDED 18 g 5  . ASPIRIN 81 PO Take 1 tablet by mouth daily.    . Cariprazine HCl (VRAYLAR) 6 MG CAPS TAKE 1 CAPSULE(6 MG) BY MOUTH DAILY 30 capsule 5  . clonazePAM (KLONOPIN) 1 MG tablet Take 1 tablet (1 mg total) by mouth 3 (three) times daily as needed for anxiety. for anxiety 90 tablet 2  . fluPHENAZine (PROLIXIN) 5 MG  tablet TAKE 1 TABLET(5 MG) BY MOUTH AT BEDTIME 30 tablet 5  . fluticasone (FLONASE) 50 MCG/ACT nasal spray SHAKE LIQUID AND USE 2 SPRAYS IN EACH NOSTRIL DAILY 16 g 6  . glipiZIDE (GLUCOTROL) 10 MG tablet TAKE 1 TABLET(10 MG) BY MOUTH TWICE DAILY 180 tablet 3  . JARDIANCE 25 MG TABS tablet TAKE 1 TABLET BY MOUTH DAILY 90 tablet 2  . lamoTRIgine (LAMICTAL) 100 MG tablet Take 1/2 tablet by mouth daily for 5 days. Then 1/2 tablet twice daily for 5 days. Then 1/2 tablet three times daily for 5 days. Then 1 tablet twice daily thereafter. 60 tablet 3  . LUMIGAN 0.01 % SOLN   0  . meloxicam (MOBIC) 15 MG tablet Take 1 tablet (15 mg total) by mouth daily. 30 tablet 3  . metFORMIN (GLUCOPHAGE-XR) 500 MG  24 hr tablet Take 2 tablets (1,000 mg total) by mouth 2 (two) times daily. 360 tablet 1  . montelukast (SINGULAIR) 10 MG tablet TAKE 1 TABLET(10 MG) BY MOUTH AT BEDTIME 90 tablet 3  . RESTASIS 0.05 % ophthalmic emulsion     . sertraline (ZOLOFT) 100 MG tablet TAKE 1 TABLET(100 MG) BY MOUTH DAILY 30 tablet 5  . sildenafil (REVATIO) 20 MG tablet May take 3-5 tablets daily as needed 45 minutes prior. 50 tablet 1  . simvastatin (ZOCOR) 20 MG tablet TAKE 1 TABLET(20 MG) BY MOUTH EVERY EVENING 90 tablet 3  . tadalafil (CIALIS) 20 MG tablet TAKE ONE-HALF TO ONE TABLET BY MOUTH EVERY OTHER DAY AS NEEDED FOR FOR ERECTILE DYSFUNCTION 10 tablet 4  . traZODone (DESYREL) 100 MG tablet Take 1 tablet (100 mg total) by mouth at bedtime as needed for sleep. 30 tablet 5   No current facility-administered medications for this visit.      Psychiatric Specialty Exam: Review of Systems  Musculoskeletal: Positive for back pain.  Psychiatric/Behavioral: The patient is nervous/anxious.   All other systems reviewed and are negative.   There were no vitals taken for this visit.There is no height or weight on file to calculate BMI.  General Appearance: NA  Eye Contact:  NA  Speech:  Clear and Coherent and Normal Rate   Volume:  Normal  Mood:  Anxious  Affect:  NA  Thought Process:  Goal Directed  Orientation:  Full (Time, Place, and Person)  Thought Content: Rumination   Suicidal Thoughts:  No  Homicidal Thoughts:  No  Memory:  Immediate;   Good Recent;   Good Remote;   Good  Judgement:  Good  Insight:  Fair  Psychomotor Activity:  NA  Concentration:  Concentration: Good  Recall:  Good  Fund of Knowledge: Good  Language: Good  Akathisia:  Negative  Handed:  Right  AIMS (if indicated): not done  Assets:  Communication Skills Desire for Improvement Financial Resources/Insurance Housing Resilience  ADL's:  Intact  Cognition: WNL  Sleep:  Fair   Screenings: PHQ2-9   [place] Office Visit from 04/06/2020 in [facility] from 02/26/2020 in [facility] Visit from 10/08/2019 in [place] from [facility] Visit from 10/28/2016 in [facility]  PHQ-2 Total Score 1 3 2  0 0  PHQ-9 Total Score -- 11 -- -- --       Assessment and Plan: 55yo singleAAMwith schizophrenia paranoid type vsschizoaffective disorder. Patient has no hx of suicidal attempts but admits tohx ofon and off SI, he has a long hx of depression and excessive worrying as well as paranoid and grandiose delusions. He expressed havingsome bizarre perceptual disturbances e.g. that he suddenly stops hearing someone who he is talking to or the volume of conversation gradually decreases.He also reports brief, infrequent periods that he loses track of time. He has seen a neurologist for this in the past but "they did not find anything".We continuedclonazepam prn anxiety. Dc [name] (no benefit) and insteadaddedVraylarforparanoia.Heappearedless paranoid though still anxious (takes clonazepambid ortiddepending on a day).We have added Prolixin 5 mg at  bedtimewhen he became paranoid last December. Hetolerates well and reports decrease of paranoid fears.Denies having AH, tremor, difficulty swallowing. His sleepisfair - typically uses 50 mg of trazodone.Hemoglobin A1C decreased to 6.9%, no changes in appetite. Recently switched from gabapentin to lamotrigine for polyneuropathic pain.  [id number] schizophrenia  Plan: Continue Zoloft 100 mg,Vraylar6mg ,Klonopin 1 mgtid,trazodone 100 mg at HS for sleepand fluphenazine5 mg at HS.Next appointment inapproximately 2 months.The plan was discussed with patient who had an opportunity to ask questions and these were all answered.I spend25min in phone consultation with the patient.   Stephanie Acre, MD 09/04/2020, 8:44 AM

## 2020-09-07 ENCOUNTER — Other Ambulatory Visit: Payer: Self-pay

## 2020-09-08 ENCOUNTER — Ambulatory Visit: Payer: Medicare Other | Admitting: Family

## 2020-09-15 ENCOUNTER — Ambulatory Visit (INDEPENDENT_AMBULATORY_CARE_PROVIDER_SITE_OTHER): Payer: Medicare Other | Admitting: Psychology

## 2020-09-15 ENCOUNTER — Ambulatory Visit: Payer: Medicare Other | Admitting: Family

## 2020-09-15 DIAGNOSIS — F4310 Post-traumatic stress disorder, unspecified: Secondary | ICD-10-CM | POA: Diagnosis not present

## 2020-09-15 DIAGNOSIS — F200 Paranoid schizophrenia: Secondary | ICD-10-CM

## 2020-09-15 DIAGNOSIS — F331 Major depressive disorder, recurrent, moderate: Secondary | ICD-10-CM

## 2020-09-15 DIAGNOSIS — F411 Generalized anxiety disorder: Secondary | ICD-10-CM | POA: Diagnosis not present

## 2020-09-22 ENCOUNTER — Encounter: Payer: Self-pay | Admitting: Family

## 2020-09-22 ENCOUNTER — Other Ambulatory Visit: Payer: Self-pay

## 2020-09-22 ENCOUNTER — Ambulatory Visit (INDEPENDENT_AMBULATORY_CARE_PROVIDER_SITE_OTHER): Payer: Medicare Other | Admitting: Family

## 2020-09-22 VITALS — BP 112/68 | HR 91 | Temp 97.5°F | Ht 71.0 in | Wt 204.0 lb

## 2020-09-22 DIAGNOSIS — E119 Type 2 diabetes mellitus without complications: Secondary | ICD-10-CM

## 2020-09-22 DIAGNOSIS — L604 Beau's lines: Secondary | ICD-10-CM | POA: Diagnosis not present

## 2020-09-22 DIAGNOSIS — R350 Frequency of micturition: Secondary | ICD-10-CM | POA: Diagnosis not present

## 2020-09-22 DIAGNOSIS — E538 Deficiency of other specified B group vitamins: Secondary | ICD-10-CM | POA: Diagnosis not present

## 2020-09-22 DIAGNOSIS — M5416 Radiculopathy, lumbar region: Secondary | ICD-10-CM | POA: Diagnosis not present

## 2020-09-22 LAB — LIPID PANEL
Cholesterol: 141 mg/dL (ref 0–200)
HDL: 56.3 mg/dL (ref >39.00)
LDL Cholesterol: 60 mg/dL (ref 0–99)
NonHDL: 84.47
Total CHOL/HDL Ratio: 3
Triglycerides: 124 mg/dL (ref 0.0–149.0)
VLDL: 24.8 mg/dL (ref 0.0–40.0)

## 2020-09-22 LAB — COMPREHENSIVE METABOLIC PANEL
ALT: 13 U/L (ref 0–53)
AST: 14 U/L (ref 0–37)
Albumin: 4.7 g/dL (ref 3.5–5.2)
Alkaline Phosphatase: 70 U/L (ref 39–117)
BUN: 16 mg/dL (ref 6–23)
CO2: 26 mEq/L (ref 19–32)
Calcium: 9.4 mg/dL (ref 8.4–10.5)
Chloride: 103 mEq/L (ref 96–112)
Creatinine, Ser: 1.09 mg/dL (ref 0.40–1.50)
GFR: 77.19 mL/min (ref >60.00)
Glucose, Bld: 198 mg/dL — ABNORMAL HIGH (ref 70–99)
Potassium: 4 mEq/L (ref 3.5–5.1)
Sodium: 136 mEq/L (ref 135–145)
Total Bilirubin: 0.4 mg/dL (ref 0.2–1.2)
Total Protein: 7.1 g/dL (ref 6.0–8.3)

## 2020-09-22 LAB — CBC WITH DIFFERENTIAL/PLATELET
Basophils Absolute: 0.1 10*3/uL (ref 0.0–0.1)
Basophils Relative: 0.9 % (ref 0.0–3.0)
Eosinophils Absolute: 0.3 10*3/uL (ref 0.0–0.7)
Eosinophils Relative: 4 % (ref 0.0–5.0)
HCT: 45.1 % (ref 39.0–52.0)
Hemoglobin: 14.8 g/dL (ref 13.0–17.0)
Lymphocytes Relative: 23 % (ref 12.0–46.0)
Lymphs Abs: 1.5 10*3/uL (ref 0.7–4.0)
MCHC: 32.9 g/dL (ref 30.0–36.0)
MCV: 86.5 fl (ref 78.0–100.0)
Monocytes Absolute: 0.5 10*3/uL (ref 0.1–1.0)
Monocytes Relative: 7.9 % (ref 3.0–12.0)
Neutro Abs: 4.3 10*3/uL (ref 1.4–7.7)
Neutrophils Relative %: 64.2 % (ref 43.0–77.0)
Platelets: 174 10*3/uL (ref 150.0–400.0)
RBC: 5.21 Mil/uL (ref 4.22–5.81)
RDW: 13 % (ref 11.5–15.5)
WBC: 6.7 10*3/uL (ref 4.0–10.5)

## 2020-09-22 LAB — POCT URINALYSIS DIPSTICK
Bilirubin, UA: NEGATIVE
Blood, UA: NEGATIVE
Glucose, UA: POSITIVE — AB
Ketones, UA: NEGATIVE
Leukocytes, UA: NEGATIVE
Nitrite, UA: NEGATIVE
Protein, UA: NEGATIVE
Spec Grav, UA: 1.015 (ref 1.010–1.025)
Urobilinogen, UA: 0.2 E.U./dL
pH, UA: 6 (ref 5.0–8.0)

## 2020-09-22 LAB — HEMOGLOBIN A1C: Hgb A1c MFr Bld: 7.2 % — ABNORMAL HIGH (ref 4.6–6.5)

## 2020-09-22 LAB — VITAMIN B12: Vitamin B-12: >1526 pg/mL — ABNORMAL HIGH (ref 211–911)

## 2020-09-22 NOTE — Patient Instructions (Signed)
Diabetes Mellitus and Foot Care Foot care is an important part of your health, especially when you have diabetes. Diabetes may cause you to have problems because of poor blood flow (circulation) to your feet and legs, which can cause your skin to:  Become thinner and drier.  Break more easily.  Heal more slowly.  Peel and crack. You may also have nerve damage (neuropathy) in your legs and feet, causing decreased feeling in them. This means that you may not notice minor injuries to your feet that could lead to more serious problems. Noticing and addressing any potential problems early is the best way to prevent future foot problems. How to care for your feet Foot hygiene  Wash your feet daily with warm water and mild soap. Do not use hot water. Then, pat your feet and the areas between your toes until they are completely dry. Do not soak your feet as this can dry your skin.  Trim your toenails straight across. Do not dig under them or around the cuticle. File the edges of your nails with an emery board or nail file.  Apply a moisturizing lotion or petroleum jelly to the skin on your feet and to dry, brittle toenails. Use lotion that does not contain alcohol and is unscented. Do not apply lotion between your toes.   Shoes and socks  Wear clean socks or stockings every day. Make sure they are not too tight. Do not wear knee-high stockings since they may decrease blood flow to your legs.  Wear shoes that fit properly and have enough cushioning. Always look in your shoes before you put them on to be sure there are no objects inside.  To break in new shoes, wear them for just a few hours a day. This prevents injuries on your feet. Wounds, scrapes, corns, and calluses  Check your feet daily for blisters, cuts, bruises, sores, and redness. If you cannot see the bottom of your feet, use a mirror or ask someone for help.  Do not cut corns or calluses or try to remove them with medicine.  If you  find a minor scrape, cut, or break in the skin on your feet, keep it and the skin around it clean and dry. You may clean these areas with mild soap and water. Do not clean the area with peroxide, alcohol, or iodine.  If you have a wound, scrape, corn, or callus on your foot, look at it several times a day to make sure it is healing and not infected. Check for: ? Redness, swelling, or pain. ? Fluid or blood. ? Warmth. ? Pus or a bad smell.   General tips  Do not cross your legs. This may decrease blood flow to your feet.  Do not use heating pads or hot water bottles on your feet. They may burn your skin. If you have lost feeling in your feet or legs, you may not know this is happening until it is too late.  Protect your feet from hot and cold by wearing shoes, such as at the beach or on hot pavement.  Schedule a complete foot exam at least once a year (annually) or more often if you have foot problems. Report any cuts, sores, or bruises to your health care provider immediately. Where to find more information  American Diabetes Association: www.diabetes.org  Association of Diabetes Care & Education Specialists: www.diabeteseducator.org Contact a health care provider if:  You have a medical condition that increases your risk of infection and   you have any cuts, sores, or bruises on your feet.  You have an injury that is not healing.  You have redness on your legs or feet.  You feel burning or tingling in your legs or feet.  You have pain or cramps in your legs and feet.  Your legs or feet are numb.  Your feet always feel cold.  You have pain around any toenails. Get help right away if:  You have a wound, scrape, corn, or callus on your foot and: ? You have pain, swelling, or redness that gets worse. ? You have fluid or blood coming from the wound, scrape, corn, or callus. ? Your wound, scrape, corn, or callus feels warm to the touch. ? You have pus or a bad smell coming from  the wound, scrape, corn, or callus. ? You have a fever. ? You have a red line going up your leg. Summary  Check your feet every day for blisters, cuts, bruises, sores, and redness.  Apply a moisturizing lotion or petroleum jelly to the skin on your feet and to dry, brittle toenails.  Wear shoes that fit properly and have enough cushioning.  If you have foot problems, report any cuts, sores, or bruises to your health care provider immediately.  Schedule a complete foot exam at least once a year (annually) or more often if you have foot problems. This information is not intended to replace advice given to you by your health care provider. Make sure you discuss any questions you have with your health care provider. Document Revised: 03/05/2020 Document Reviewed: 03/05/2020 Elsevier Patient Education  2021 Elsevier Inc.  

## 2020-09-23 ENCOUNTER — Other Ambulatory Visit: Payer: Self-pay | Admitting: Family

## 2020-09-23 ENCOUNTER — Encounter: Payer: Self-pay | Admitting: Family

## 2020-09-23 NOTE — Progress Notes (Signed)
Acute Office Visit  Subjective:    Patient ID: Glenn Robertson, male    DOB: 1966/03/08, 55 y.o.   MRN: [id number]  Chief Complaint  Patient presents with  . Nail Problem    Left thumb has extra nail on nail bed x 1 year, scab on left ear , would like referral to urologist, shakes becoming worse.     HPI Patient is in today with c/o an indention in his thumb nails x 6 months. Denies any pain. Admits to nailbiting. Also reports a history of Vitamin B12 deficiency in the past. Patient also reports that he has numbness in his feet. He has a history of lumbar radiculopathy and is under the care of neurology. Believes he needs a MRI. Patient also reports urinary frequency and post void dribbling x 12-13 years but wants to see a urologist.  Past Medical History:  Diagnosis Date  . Allergy    dog and cats, citrus  . Anxiety   . Asthma   . Chronic pain   . Depression   . Diabetes mellitus   . Glaucoma   . Neuromuscular disorder ([place])   . Paranoid schizophrenia [facility])     Past Surgical History:  Procedure Laterality Date  . ANKLE ARTHROSCOPY    . Arm Surgery  1/12   rt ulnar nerve decompression  . EPIDURAL BLOCK INJECTION     several  . ULNAR NERVE TRANSPOSITION  01/19/2012   Procedure: ULNAR NERVE DECOMPRESSION/TRANSPOSITION;  Surgeon: Cammie Sickle., MD;  Location: [facility];  Service: Orthopedics;  Laterality: Left;  ulnar nerve decompression vs transposition left elbow    Family History  Problem Relation Age of Onset  . Diabetes Father   . Diabetes Paternal Uncle   . Colon cancer Neg Hx   . Colon polyps Neg Hx   . Esophageal cancer Neg Hx   . Rectal cancer Neg Hx   . Stomach cancer Neg Hx     Social History   Socioeconomic History  . Marital status: Single    Spouse name: Not on file  . Number of children: 0  . Years of education: BA  . Highest education level: Not on file  Occupational History  . Occupation: Unemlpoyed-disabled  Tobacco Use   . Smoking status: Never Smoker  . Smokeless tobacco: Never Used  Vaping Use  . Vaping Use: Never used  Substance and Sexual Activity  . Alcohol use: No  . Drug use: No  . Sexual activity: Not on file  Other Topics Concern  . Not on file  Social History Narrative   Lives with mother   Caffeine use: Coke very rare   Right handed    Social Determinants of Health   Financial Resource Strain: Low Risk   . Difficulty of Paying Living Expenses: Not hard at all  Food Insecurity: No Food Insecurity  . Worried About Charity fundraiser in the Last Year: Never true  . Ran Out of Food in the Last Year: Never true  Transportation Needs: No Transportation Needs  . Lack of Transportation (Medical): No  . Lack of Transportation (Non-Medical): No  Physical Activity: Inactive  . Days of Exercise per Week: 0 days  . Minutes of Exercise per Session: 0 min  Stress: Not on file  Social Connections: Socially Isolated  . Frequency of Communication with Friends and Family: Once a week  . Frequency of Social Gatherings with Friends and Family: Once a week  . Attends  Religious Services: Never  . Active Member of Clubs or Organizations: No  . Attends Archivist Meetings: Never  . Marital Status: Never married  Intimate Partner Violence: Not on file    Outpatient Medications Prior to Visit  Medication Sig Dispense Refill  . ACCU-CHEK AVIVA PLUS test strip TEST THREE TIMES DAILY 100 strip 5  . ACCU-CHEK SOFTCLIX LANCETS lancets 3 (three) times daily. for testing  0  . albuterol (VENTOLIN HFA) 108 (90 Base) MCG/ACT inhaler INHALE 2 PUFFS BY MOUTH INTO THE LUNGS AS NEEDED 18 g 5  . Cariprazine HCl (VRAYLAR) 6 MG CAPS TAKE 1 CAPSULE(6 MG) BY MOUTH DAILY 30 capsule 5  . clonazePAM (KLONOPIN) 1 MG tablet Take 1 tablet (1 mg total) by mouth 3 (three) times daily as needed for anxiety. for anxiety 90 tablet 2  . fluPHENAZine (PROLIXIN) 5 MG tablet TAKE 1 TABLET(5 MG) BY MOUTH AT BEDTIME 30  tablet 5  . fluticasone (FLONASE) 50 MCG/ACT nasal spray SHAKE LIQUID AND USE 2 SPRAYS IN EACH NOSTRIL DAILY 16 g 6  . glipiZIDE (GLUCOTROL) 10 MG tablet TAKE 1 TABLET(10 MG) BY MOUTH TWICE DAILY 180 tablet 3  . JARDIANCE 25 MG TABS tablet TAKE 1 TABLET BY MOUTH DAILY 90 tablet 2  . lamoTRIgine (LAMICTAL) 100 MG tablet Take 1/2 tablet by mouth daily for 5 days. Then 1/2 tablet twice daily for 5 days. Then 1/2 tablet three times daily for 5 days. Then 1 tablet twice daily thereafter. 60 tablet 3  . meloxicam (MOBIC) 15 MG tablet Take 1 tablet (15 mg total) by mouth daily. 30 tablet 3  . metFORMIN (GLUCOPHAGE-XR) 500 MG 24 hr tablet Take 2 tablets (1,000 mg total) by mouth 2 (two) times daily. 360 tablet 1  . montelukast (SINGULAIR) 10 MG tablet TAKE 1 TABLET(10 MG) BY MOUTH AT BEDTIME 90 tablet 3  . RESTASIS 0.05 % ophthalmic emulsion     . sertraline (ZOLOFT) 100 MG tablet TAKE 1 TABLET(100 MG) BY MOUTH DAILY 30 tablet 5  . simvastatin (ZOCOR) 20 MG tablet TAKE 1 TABLET(20 MG) BY MOUTH EVERY EVENING 90 tablet 3  . tadalafil (CIALIS) 20 MG tablet TAKE ONE-HALF TO ONE TABLET BY MOUTH EVERY OTHER DAY AS NEEDED FOR FOR ERECTILE DYSFUNCTION 10 tablet 4  . ASPIRIN 81 PO Take 1 tablet by mouth daily. (Patient not taking: Reported on 09/22/2020)    . LUMIGAN 0.01 % SOLN  (Patient not taking: Reported on 09/22/2020)  0  . sildenafil (REVATIO) 20 MG tablet May take 3-5 tablets daily as needed 45 minutes prior. (Patient not taking: Reported on 09/22/2020) 50 tablet 1  . traZODone (DESYREL) 100 MG tablet Take 1 tablet (100 mg total) by mouth at bedtime as needed for sleep. (Patient not taking: Reported on 09/22/2020) 30 tablet 5   No facility-administered medications prior to visit.    Allergies  Allergen Reactions  . Citric Acid Other (See Comments)    Runny nose, eyes water  . Food     Oranges or anything with citric acid  . Gabapentin     Walking into things, hard to maintain balance, falls     Review of Systems  Constitutional: Negative.   Respiratory: Negative.   Cardiovascular: Negative.   Endocrine: Negative.   Genitourinary: Positive for frequency.  Musculoskeletal: Positive for arthralgias and back pain.  Skin: Negative.   Allergic/Immunologic: Negative.   Neurological: Positive for numbness.       Numbness in the feet  Psychiatric/Behavioral: Negative.  All other systems reviewed and are negative.      Objective:    Physical Exam Vitals reviewed.  Constitutional:      Appearance: Normal appearance.  HENT:     Mouth/Throat:     Mouth: Mucous membranes are dry.  Cardiovascular:     Rate and Rhythm: Normal rate and regular rhythm.     Pulses: Normal pulses.     Heart sounds: Normal heart sounds.  Pulmonary:     Effort: Pulmonary effort is normal.     Breath sounds: Normal breath sounds.  Abdominal:     General: Abdomen is flat. Bowel sounds are normal.     Palpations: Abdomen is soft.  Musculoskeletal:        General: Normal range of motion.     Cervical back: Normal range of motion.  Skin:    General: Skin is warm and dry.     Comments: Nails have indention bilaterally.   Neurological:     General: No focal deficit present.     Mental Status: He is alert and oriented to person, place, and time.  Psychiatric:        Mood and Affect: Mood normal.        Behavior: Behavior normal.     BP 112/68   Pulse 91   Temp (!) 97.5 F (36.4 C) (Temporal)   Ht $R'5\' 11"'Cz$  (1.803 m)   Wt 204 lb (92.5 kg)   SpO2 96%   BMI 28.45 kg/m  Wt Readings from Last 3 Encounters:  09/22/20 204 lb (92.5 kg)  04/06/20 200 lb (90.7 kg)  04/01/20 199 lb 8 oz (90.5 kg)    Health Maintenance Due  Topic Date Due  . PNEUMOCOCCAL POLYSACCHARIDE VACCINE AGE 47-64 HIGH RISK  Never done  . COVID-19 Vaccine (3 - Booster for Pfizer series) 06/04/2020  . FOOT EXAM  07/07/2020  . URINE MICROALBUMIN  10/07/2020    There are no preventive care reminders to display for this  patient.   Lab Results  Component Value Date   TSH 1.02 07/08/2019   Lab Results  Component Value Date   WBC 6.7 09/22/2020   HGB 14.8 09/22/2020   HCT 45.1 09/22/2020   MCV 86.5 09/22/2020   PLT 174.0 09/22/2020   Lab Results  Component Value Date   NA 136 09/22/2020   K 4.0 09/22/2020   CO2 26 09/22/2020   GLUCOSE 198 (H) 09/22/2020   BUN 16 09/22/2020   CREATININE 1.09 09/22/2020   BILITOT 0.4 09/22/2020   ALKPHOS 70 09/22/2020   AST 14 09/22/2020   ALT 13 09/22/2020   PROT 7.1 09/22/2020   ALBUMIN 4.7 09/22/2020   CALCIUM 9.4 09/22/2020   ANIONGAP 9 06/28/2019   GFR 77.19 09/22/2020   Lab Results  Component Value Date   CHOL 141 09/22/2020   Lab Results  Component Value Date   HDL 56.30 09/22/2020   Lab Results  Component Value Date   LDLCALC 60 09/22/2020   Lab Results  Component Value Date   TRIG 124.0 09/22/2020   Lab Results  Component Value Date   CHOLHDL 3 09/22/2020   Lab Results  Component Value Date   HGBA1C 7.2 (H) 09/22/2020       Assessment & Plan:   Problem List Items Addressed This Visit    Type 2 diabetes mellitus without complication, without long-term current use of insulin ([place]) - Primary   Relevant Orders   Comp Met (CMET) (Completed)   B12 (  Completed)   Hemoglobin A1c (Completed)   Lipid Panel (Completed)   CBC w/Diff (Completed)    Other Visit Diagnoses    Lumbar radiculopathy       Urinary frequency       Relevant Orders   Ambulatory referral to Urology   POC Urinalysis Dipstick (Completed)   Beau's lines of nails       Relevant Orders   B12 (Completed)   CBC w/Diff (Completed)   Vitamin B 12 deficiency       Relevant Orders   Comp Met (CMET) (Completed)   B12 (Completed)   CBC w/Diff (Completed)       Labs obtained. Advised patient to call neurologist to discuss concerns of numbness in the feet to decide next steps. Urology referral placed.    Kennyth Arnold, FNP

## 2020-09-24 ENCOUNTER — Telehealth: Payer: Self-pay

## 2020-09-24 DIAGNOSIS — H04123 Dry eye syndrome of bilateral lacrimal glands: Secondary | ICD-10-CM | POA: Diagnosis not present

## 2020-09-24 DIAGNOSIS — H43813 Vitreous degeneration, bilateral: Secondary | ICD-10-CM | POA: Diagnosis not present

## 2020-09-24 DIAGNOSIS — H401231 Low-tension glaucoma, bilateral, mild stage: Secondary | ICD-10-CM | POA: Diagnosis not present

## 2020-09-24 DIAGNOSIS — H2513 Age-related nuclear cataract, bilateral: Secondary | ICD-10-CM | POA: Diagnosis not present

## 2020-09-24 NOTE — Telephone Encounter (Signed)
Pt calling to ask for a athlete's foot treatment that can be prescribed.  Patient forgot to mention it during last visit.  Pt has tried OTC products with no relief.  Please advise.  [phone]

## 2020-09-25 MED ORDER — CLOTRIMAZOLE-BETAMETHASONE 1-0.05 % EX CREA: 1.0000 "application " | TOPICAL_CREAM | Freq: Two times a day (BID) | CUTANEOUS | 0 refills | Status: DC

## 2020-09-25 NOTE — Telephone Encounter (Signed)
Patient aware that Rx sent in  °

## 2020-09-25 NOTE — Telephone Encounter (Signed)
Is there something that could sent in for patient for athlete's foot. Please advise review message below.

## 2020-09-27 ENCOUNTER — Other Ambulatory Visit: Payer: Self-pay | Admitting: Family Medicine

## 2020-09-27 DIAGNOSIS — E119 Type 2 diabetes mellitus without complications: Secondary | ICD-10-CM

## 2020-09-28 ENCOUNTER — Telehealth: Payer: Self-pay | Admitting: Neurology

## 2020-09-28 DIAGNOSIS — M5417 Radiculopathy, lumbosacral region: Secondary | ICD-10-CM

## 2020-09-28 NOTE — Telephone Encounter (Signed)
We recently started lamotrigine.  I do not think that that medication would cause tremors.

## 2020-09-28 NOTE — Telephone Encounter (Signed)
Pt called wanting to know if he can discuss the shakes that he has noticed recently with the provider. Pt states they started a couple of weeks ago and he is wanting to know if this is due to the medication he started about a month ago. Pt would also like to know if he can be referred to a Neurosurgeon for his back. Please advise.

## 2020-09-29 NOTE — Telephone Encounter (Signed)
Spoke with Dr. Felecia Shelling about request for referral to Neurosurgery. He is ok to refer to Dr. Davy Pique at [facility] for [facility] for left S1 radiculopathy.   I called pt. Relayed Dr. Garth Bigness messages. He states he has back/hip pain intermittently in the morning after waking. He is more concerned about polyneuropathy in feet. About the same as last visit but concerned about it worsening. Wondering if Dr. Felecia Shelling still wants him to see Neurosurgery or proceed with different plan. Advised I will speak with MD and call back.

## 2020-09-30 DIAGNOSIS — N401 Enlarged prostate with lower urinary tract symptoms: Secondary | ICD-10-CM | POA: Diagnosis not present

## 2020-09-30 DIAGNOSIS — N3943 Post-void dribbling: Secondary | ICD-10-CM | POA: Diagnosis not present

## 2020-09-30 DIAGNOSIS — Z125 Encounter for screening for malignant neoplasm of prostate: Secondary | ICD-10-CM | POA: Diagnosis not present

## 2020-09-30 DIAGNOSIS — R3915 Urgency of urination: Secondary | ICD-10-CM | POA: Diagnosis not present

## 2020-09-30 DIAGNOSIS — R350 Frequency of micturition: Secondary | ICD-10-CM | POA: Diagnosis not present

## 2020-10-01 ENCOUNTER — Other Ambulatory Visit: Payer: Self-pay

## 2020-10-02 ENCOUNTER — Ambulatory Visit (INDEPENDENT_AMBULATORY_CARE_PROVIDER_SITE_OTHER): Payer: Medicare Other | Admitting: Family Medicine

## 2020-10-02 ENCOUNTER — Encounter: Payer: Self-pay | Admitting: Family Medicine

## 2020-10-02 VITALS — BP 116/74 | HR 95 | Temp 97.6°F | Ht 71.0 in | Wt 204.4 lb

## 2020-10-02 DIAGNOSIS — L219 Seborrheic dermatitis, unspecified: Secondary | ICD-10-CM | POA: Diagnosis not present

## 2020-10-02 DIAGNOSIS — L309 Dermatitis, unspecified: Secondary | ICD-10-CM | POA: Insufficient documentation

## 2020-10-02 DIAGNOSIS — G251 Drug-induced tremor: Secondary | ICD-10-CM

## 2020-10-02 NOTE — Progress Notes (Signed)
[facility] [address] [place] [id number] Dept: [phone] Dept Fax: [phone]  Acute Office Visit  Subjective:    Patient ID: Glenn Robertson, male    DOB: 13-Jun-1966, 55 y.o..   MRN: [id number]  Chief Complaint  Patient presents with  . Tremors    C/O tremors per patient he noticed about 1-2 months ago. Would also like left ear checked.     History of Present Illness:  Patient is in today for two complaints:  He was recently noted to have a dry area on the left pinna. However, he is concerned there is more going on, as it is not resolving. He does have a history of dandruff. He also notes occasional flakiness of the skin at his brown and in the nasal folds. He typically uses a shampoo to control this.  He notes a tremor that has developed gradually. He had a tremor some years ago secondary to Abilify. He used Benadryl to help control, but eventually got off of the Abilify, as he did not like the tremor. Glenn Robertson has a history fo schizophrenia and depression and is currently treated with multiple psychoactive drugs. He is managed by a psychiatrist for this. He denies tremor other than in the hands.  Past Medical History: Patient Active Problem List   Diagnosis Date Noted  . Tremor due to multiple drugs 10/02/2020  . Seborrheic dermatitis 10/02/2020  . Polyneuropathy 05/12/2020  . Elevated cholesterol 10/08/2019  . Benign prostatic hyperplasia with nocturia 10/08/2019  . Schizoaffective disorder, depressive type ([place]) 11/27/2018  . Urinary hesitancy 10/08/2018  . Erectile dysfunction 09/24/2018  . Arm numbness 08/20/2018  . Transient alteration of awareness 08/08/2018  . Tendinopathy of right gluteus medius 05/22/2018  . Pain of right hip joint 05/08/2018  . Hearing difficulty 04/13/2018  . Trochanteric bursitis, right hip 03/23/2018  . Asthma 03/16/2018  . Headache 03/02/2018  . Type 2 diabetes mellitus  without complication, without long-term current use of insulin ([place]) 02/16/2018  . Healthcare maintenance 02/16/2018  . Pain in left leg 10/28/2016  . Lumbosacral radiculopathy at S1 03/15/2016  . Lumbar degenerative disc disease 12/18/2015  . Disc displacement, lumbar 04/21/2015  . Chondromalacia of both patellae 02/24/2015  . Paranoid schizophrenia, chronic condition ([place]) 02/24/2015  . Right anterior knee pain 07/18/2014  . Right ankle pain 01/27/2014  . Pain in joint, ankle and foot 01/27/2014  . Sciatica 01/10/2012  . Disturbance of skin sensation 11/14/2011   Past Surgical History:  Procedure Laterality Date  . ANKLE ARTHROSCOPY    . Arm Surgery  1/12   rt ulnar nerve decompression  . EPIDURAL BLOCK INJECTION     several  . ULNAR NERVE TRANSPOSITION  01/19/2012   Procedure: ULNAR NERVE DECOMPRESSION/TRANSPOSITION;  Surgeon: Cammie Sickle., MD;  Location: [facility];  Service: Orthopedics;  Laterality: Left;  ulnar nerve decompression vs transposition left elbow   Outpatient Medications Prior to Visit  Medication Sig Dispense Refill  . ACCU-CHEK AVIVA PLUS test strip TEST THREE TIMES DAILY 100 strip 5  . ACCU-CHEK SOFTCLIX LANCETS lancets 3 (three) times daily. for testing  0  . albuterol (VENTOLIN HFA) 108 (90 Base) MCG/ACT inhaler INHALE 2 PUFFS BY MOUTH INTO THE LUNGS AS NEEDED 18 g 5  . Cariprazine HCl (VRAYLAR) 6 MG CAPS TAKE 1 CAPSULE(6 MG) BY MOUTH DAILY 30 capsule 5  . clonazePAM (KLONOPIN) 1 MG tablet Take 1 tablet (1 mg total) by mouth 3 (  three) times daily as needed for anxiety. for anxiety 90 tablet 2  . clotrimazole-betamethasone (LOTRISONE) cream Apply 1 application topically 2 (two) times daily. 45 g 0  . fluPHENAZine (PROLIXIN) 5 MG tablet TAKE 1 TABLET(5 MG) BY MOUTH AT BEDTIME 30 tablet 5  . fluticasone (FLONASE) 50 MCG/ACT nasal spray SHAKE LIQUID AND USE 2 SPRAYS IN EACH NOSTRIL DAILY 16 g 6  . glipiZIDE (GLUCOTROL) 10 MG tablet TAKE 1  TABLET(10 MG) BY MOUTH TWICE DAILY 180 tablet 3  . JARDIANCE 25 MG TABS tablet TAKE 1 TABLET BY MOUTH DAILY 90 tablet 2  . lamoTRIgine (LAMICTAL) 100 MG tablet Take 1/2 tablet by mouth daily for 5 days. Then 1/2 tablet twice daily for 5 days. Then 1/2 tablet three times daily for 5 days. Then 1 tablet twice daily thereafter. 60 tablet 3  . meloxicam (MOBIC) 15 MG tablet Take 1 tablet (15 mg total) by mouth daily. 30 tablet 3  . metFORMIN (GLUCOPHAGE-XR) 500 MG 24 hr tablet TAKE 2 TABLETS(1000 MG) BY MOUTH TWICE DAILY 360 tablet 1  . montelukast (SINGULAIR) 10 MG tablet TAKE 1 TABLET(10 MG) BY MOUTH AT BEDTIME 90 tablet 3  . RESTASIS 0.05 % ophthalmic emulsion     . sertraline (ZOLOFT) 100 MG tablet TAKE 1 TABLET(100 MG) BY MOUTH DAILY 30 tablet 5  . simvastatin (ZOCOR) 20 MG tablet TAKE 1 TABLET(20 MG) BY MOUTH EVERY EVENING 90 tablet 3  . tadalafil (CIALIS) 20 MG tablet TAKE ONE-HALF TO ONE TABLET BY MOUTH EVERY OTHER DAY AS NEEDED FOR FOR ERECTILE DYSFUNCTION 10 tablet 4  . ASPIRIN 81 PO Take 1 tablet by mouth daily. (Patient not taking: No sig reported)    . LUMIGAN 0.01 % SOLN  (Patient not taking: No sig reported)  0  . sildenafil (REVATIO) 20 MG tablet May take 3-5 tablets daily as needed 45 minutes prior. (Patient not taking: No sig reported) 50 tablet 1  . traZODone (DESYREL) 100 MG tablet Take 1 tablet (100 mg total) by mouth at bedtime as needed for sleep. (Patient not taking: Reported on 09/22/2020) 30 tablet 5   No facility-administered medications prior to visit.   Allergies  Allergen Reactions  . Citric Acid Other (See Comments)    Runny nose, eyes water  . Food     Oranges or anything with citric acid  . Gabapentin     Walking into things, hard to maintain balance, falls     Objective:   Today's Vitals   10/02/20 1108  BP: 116/74  Pulse: 95  Temp: 97.6 F (36.4 C)  TempSrc: Temporal  SpO2: 96%  Weight: 204 lb 6.4 oz (92.7 kg)  Height: 5\' 11"  (1.803 m)   Body mass  index is 28.51 kg/m.   General: Well developed, well nourished. No acute distress. HEENT: Normocephalic, non-traumatic. There are several small patches of scaly skin with mild superficial ulceration   in the folds of the pinna. No redness or drainage. Neuro: The patient has a very mild fine tremor when holding his hands out. However, when laying his hands down and   at rest, he has an obvious pill-rolling tremor. He has no cog-wheel rigidity of the upper arms and his gait is normal. His   affect is a bit flat.  Health Maintenance Due  Topic Date Due  . PNEUMOCOCCAL POLYSACCHARIDE VACCINE AGE 20-64 HIGH RISK  Never done  . COVID-19 Vaccine (3 - Booster for Pfizer series) 06/04/2020  . FOOT EXAM  07/07/2020  .  URINE MICROALBUMIN  10/07/2020     Assessment & Plan:   1. Seborrheic dermatitis Recommend OTC 1% hydrocortisone cream twice daily.  2. Tremor due to multiple drugs Mr. Byington appears to have some parkinsonian-like symptoms (rest tremor and flat affect). These may be side effects of his antipsychotic medications. I recommend he follow-up with his psychiatrist for evaluation and to determine if further evaluation is needed vs. a medication change.  Haydee Salter, MD

## 2020-10-05 ENCOUNTER — Telehealth (HOSPITAL_COMMUNITY): Payer: Self-pay | Admitting: *Deleted

## 2020-10-05 NOTE — Telephone Encounter (Signed)
Patient called and reported that he was experiencing tremors in his arms and hands.  He said he thought it was related to [employer].  I can see he has been on this quite some time without this side effect.  Please review and advise.

## 2020-10-06 ENCOUNTER — Other Ambulatory Visit (HOSPITAL_COMMUNITY): Payer: Self-pay | Admitting: Psychiatry

## 2020-10-06 MED ORDER — AMANTADINE HCL 100 MG PO CAPS: 100.0000 mg | ORAL_CAPSULE | Freq: Two times a day (BID) | ORAL | 1 refills | Status: DC

## 2020-10-06 NOTE — Telephone Encounter (Signed)
It is likely drug-induced EPS - I added amantadine  100 mg bid. We will talk about it more at his next appointment.

## 2020-10-06 NOTE — Telephone Encounter (Signed)
Placed call to patient and advised him of prescription.

## 2020-10-15 ENCOUNTER — Other Ambulatory Visit: Payer: Self-pay

## 2020-10-15 ENCOUNTER — Ambulatory Visit: Payer: Medicare Other | Attending: Urology | Admitting: Physical Therapy

## 2020-10-15 ENCOUNTER — Ambulatory Visit (INDEPENDENT_AMBULATORY_CARE_PROVIDER_SITE_OTHER): Payer: Medicare Other | Admitting: Psychology

## 2020-10-15 ENCOUNTER — Encounter: Payer: Self-pay | Admitting: Physical Therapy

## 2020-10-15 DIAGNOSIS — R293 Abnormal posture: Secondary | ICD-10-CM | POA: Diagnosis not present

## 2020-10-15 DIAGNOSIS — F4310 Post-traumatic stress disorder, unspecified: Secondary | ICD-10-CM | POA: Diagnosis not present

## 2020-10-15 DIAGNOSIS — M6281 Muscle weakness (generalized): Secondary | ICD-10-CM

## 2020-10-15 DIAGNOSIS — F331 Major depressive disorder, recurrent, moderate: Secondary | ICD-10-CM

## 2020-10-15 DIAGNOSIS — F411 Generalized anxiety disorder: Secondary | ICD-10-CM | POA: Diagnosis not present

## 2020-10-15 DIAGNOSIS — F200 Paranoid schizophrenia: Secondary | ICD-10-CM | POA: Diagnosis not present

## 2020-10-15 DIAGNOSIS — R279 Unspecified lack of coordination: Secondary | ICD-10-CM

## 2020-10-15 NOTE — Therapy (Signed)
[facility] [address], [place], [name], [address] Phone: [phone]   Fax:  [phone]  Physical Therapy Evaluation  Patient Details  Name: Glenn Robertson MRN: [id number] Date of Birth: June 23, 1966 Referring Provider (PT): Lucas Mallow, MD   Encounter Date: 10/15/2020   PT End of Session - 10/15/20 1643    Visit Number 1    Date for PT Re-Evaluation 01/07/21    Authorization Type medicare/medicaid    PT Start Time 1104    PT Stop Time 1145    PT Time Calculation (min) 41 min    Activity Tolerance Patient tolerated treatment well    Behavior During Therapy [facility] for tasks assessed/performed           Past Medical History:  Diagnosis Date  . Allergy    dog and cats, citrus  . Anxiety   . Asthma   . Chronic pain   . Depression   . Diabetes mellitus   . Glaucoma   . Neuromuscular disorder ([place])   . Paranoid schizophrenia [facility])     Past Surgical History:  Procedure Laterality Date  . ANKLE ARTHROSCOPY    . Arm Surgery  1/12   rt ulnar nerve decompression  . EPIDURAL BLOCK INJECTION     several  . ULNAR NERVE TRANSPOSITION  01/19/2012   Procedure: ULNAR NERVE DECOMPRESSION/TRANSPOSITION;  Surgeon: Cammie Sickle., MD;  Location: [place];  Service: Orthopedics;  Laterality: Left;  ulnar nerve decompression vs transposition left elbow    There were no vitals filed for this visit.    Subjective Assessment - 10/15/20 1108    Subjective Pt states this issue has been going on for 10+ years.  Pt states he voids at least 7x/day.  Nocturia 2x/night.  He has small amount of leakage after voiding    Patient Stated Goals be able to empty bladder in less time    Currently in Pain? No/denies              [facility] PT Assessment - 10/16/20 0001      Assessment   Medical Diagnosis Z12.5 (ICD-10-CM) - Encounter for screening for malignant neoplasm of prostate; R39.15 (ICD-10-CM) -  Urgency of urination;N39.43 (ICD-10-CM) - Post-void dribbling;R35.0 (ICD-10-CM) - Frequency of micturition    Referring Provider (PT) Marton Redwood III, MD      Precautions   Precautions None      Restrictions   Weight Bearing Restrictions No      Balance Screen   Has the patient fallen in the past 6 months Yes    How many times? 3-4   not given balance screen today out of time; pt states this is normal for him due to meds   Has the patient had a decrease in activity level because of a fear of falling?  No    Is the patient reluctant to leave their home because of a fear of falling?  No      Home Environment   Living Environment Private residence    Living Arrangements Parent   mother     Prior Function   Level of Independence Independent    Vocation Requirements lying down a lot of the time due to back problems    Leisure running 1-2 miles; leg strength      Cognition   Overall Cognitive Status Within Functional Limits for tasks assessed      Posture/Postural Control   Posture/Postural Control  Postural limitations    Postural Limitations Rounded Shoulders;Anterior pelvic tilt;Increased lumbar lordosis      PROM   Overall PROM Comments Rt hip 50% Lt hip 80%      Strength   Overall Strength Comments 5/5 bilat LE; tenting of abdominal wall due to weakness and diastasis of rectus abdominus      Flexibility   Soft Tissue Assessment /Muscle Length yes    Hamstrings Rt tighter than Lt      Palpation   Palpation comment lumbar paraspinals tight      Ambulation/Gait   Gait Pattern Within Functional Limits                      Objective measurements completed on examination: See above findings.     Pelvic Floor Special Questions - 10/16/20 0001    Prior Pelvic/Prostate Exam Yes    Result Pelvic/Prostate Exam  high PSA    Urinary Leakage Yes    How often after voiding    Pad use no    Urinary urgency No    Urinary frequency 7/day and 2/night    Skin  Integrity Intact    Pelvic Floor Internal Exam Pt identity confirmed and internal assessment performed    Exam Type Rectal    Palpation slow resting time unable to relax fully    Strength fair squeeze, definite lift    Strength # of reps --   2/10 sec   Strength # of seconds 5    Tone high and weak                         PT Long Term Goals - 10/15/20 1113      PT LONG TERM GOAL #1   Title Pt will be able start the stream in 2 seconds or less    Baseline can take 10 sec or more    Time 12    Period Weeks    Status New    Target Date 01/07/21      PT LONG TERM GOAL #2   Title Be able to sleep through the night without nocturia    Baseline 1-2/ night    Time 12    Period Weeks    Status New    Target Date 01/07/21      PT LONG TERM GOAL #3   Title Pt will be able to jog 1-2 miles without increased bladder symptoms    Time 12    Period Weeks    Status New    Target Date 01/07/21                  Plan - 10/16/20 0907    Clinical Impression Statement Pt presents to clinic due to chronic pelvic floor dysfunction.  Pt has tight hamstrings, lumbar paraspinals, and decreased hip ROM as noted above.  Pt has normal LE strength.  Upon assessment of pelvic floor, he demonstrates difficulty relaxing and very slow resting time only able to do 2 contractions in 10 seconds.  Pt has weakness of 3/5 but only holding for 5 seconds.  Pt also has abdominal wall weakness and tension in upper torso with decreased ribcage excursion during inspiration. Pt has bulging and diastasis rectus abdominus with MMT of abdominal wall.  Pt will benefit from skilled PT to address impairments and improve bladder function with reduced leakage and normal bladder emptying.    Personal Factors and Comorbidities Time since  onset of injury/illness/exacerbation;Comorbidity 1    Comorbidities chronic low back pain    Examination-Activity Limitations Toileting;Continence     Examination-Participation Restrictions Community Activity    Stability/Clinical Decision Making Evolving/Moderate complexity    Clinical Decision Making Moderate    Rehab Potential Good    PT Frequency 1x / week    PT Duration 12 weeks    PT Treatment/Interventions ADLs/Self Care Home Management;Biofeedback;Cryotherapy;Electrical Stimulation;Moist Heat;Therapeutic activities;Therapeutic exercise;Neuromuscular re-education;Manual techniques;Patient/family education;Dry needling;Passive range of motion;Taping           Patient will benefit from skilled therapeutic intervention in order to improve the following deficits and impairments:  Postural dysfunction,Decreased strength,Impaired flexibility,Pain,Increased muscle spasms,Increased fascial restricitons,Decreased range of motion,Decreased coordination  Visit Diagnosis: Muscle weakness (generalized)  Unspecified lack of coordination  Abnormal posture     Problem List Patient Active Problem List   Diagnosis Date Noted  . Tremor due to multiple drugs 10/02/2020  . Seborrheic dermatitis 10/02/2020  . Polyneuropathy 05/12/2020  . Elevated cholesterol 10/08/2019  . Benign prostatic hyperplasia with nocturia 10/08/2019  . Schizoaffective disorder, depressive type ([place]) 11/27/2018  . Urinary hesitancy 10/08/2018  . Erectile dysfunction 09/24/2018  . Arm numbness 08/20/2018  . Transient alteration of awareness 08/08/2018  . Tendinopathy of right gluteus medius 05/22/2018  . Pain of right hip joint 05/08/2018  . Hearing difficulty 04/13/2018  . Trochanteric bursitis, right hip 03/23/2018  . Asthma 03/16/2018  . Headache 03/02/2018  . Type 2 diabetes mellitus without complication, without long-term current use of insulin ([place]) 02/16/2018  . Healthcare maintenance 02/16/2018  . Pain in left leg 10/28/2016  . Lumbosacral radiculopathy at S1 03/15/2016  . Lumbar degenerative disc disease 12/18/2015  . Disc displacement, lumbar  04/21/2015  . Chondromalacia of both patellae 02/24/2015  . Paranoid schizophrenia, chronic condition ([place]) 02/24/2015  . Right anterior knee pain 07/18/2014  . Right ankle pain 01/27/2014  . Pain in joint, ankle and foot 01/27/2014  . Sciatica 01/10/2012  . Disturbance of skin sensation 11/14/2011    Jule Ser, PT 10/16/2020, 9:30 AM  [facility] [address] [place], [name], [address] Phone: [phone]   Fax:  [phone]  Name: Glenn Robertson MRN: [id number] Date of Birth: 27-Feb-1966

## 2020-10-23 ENCOUNTER — Other Ambulatory Visit: Payer: Self-pay | Admitting: Family Medicine

## 2020-10-23 ENCOUNTER — Telehealth: Payer: Self-pay

## 2020-10-23 NOTE — Telephone Encounter (Signed)
FYI [facility] called and wanted lindsey to look out for a plan of care and a medicaid compliance form to sign for this pt. CB if needing to talk to them is [phone]

## 2020-10-27 ENCOUNTER — Encounter: Payer: Self-pay | Admitting: Physical Therapy

## 2020-10-27 ENCOUNTER — Ambulatory Visit: Payer: Medicare Other | Attending: Urology | Admitting: Physical Therapy

## 2020-10-27 ENCOUNTER — Other Ambulatory Visit: Payer: Self-pay

## 2020-10-27 DIAGNOSIS — R293 Abnormal posture: Secondary | ICD-10-CM | POA: Insufficient documentation

## 2020-10-27 DIAGNOSIS — M6281 Muscle weakness (generalized): Secondary | ICD-10-CM | POA: Diagnosis not present

## 2020-10-27 DIAGNOSIS — R279 Unspecified lack of coordination: Secondary | ICD-10-CM | POA: Diagnosis not present

## 2020-10-27 NOTE — Therapy (Signed)
[facility] [address], [place], [name], [address] Phone: [phone]   Fax:  [phone]  Physical Therapy Treatment  Patient Details  Name: Glenn Robertson MRN: [id number] Date of Birth: 1966-02-11 Referring Provider (PT): Lucas Mallow, MD   Encounter Date: 10/27/2020   PT End of Session - 10/27/20 1236    Visit Number 2    Date for PT Re-Evaluation 01/07/21    Authorization Type medicare/medicaid    PT Start Time 1232    PT Stop Time 1308    PT Time Calculation (min) 36 min    Activity Tolerance Patient tolerated treatment well    Behavior During Therapy [facility] for tasks assessed/performed           Past Medical History:  Diagnosis Date  . Allergy    dog and cats, citrus  . Anxiety   . Asthma   . Chronic pain   . Depression   . Diabetes mellitus   . Glaucoma   . Neuromuscular disorder ([place])   . Paranoid schizophrenia [facility])     Past Surgical History:  Procedure Laterality Date  . ANKLE ARTHROSCOPY    . Arm Surgery  1/12   rt ulnar nerve decompression  . EPIDURAL BLOCK INJECTION     several  . ULNAR NERVE TRANSPOSITION  01/19/2012   Procedure: ULNAR NERVE DECOMPRESSION/TRANSPOSITION;  Surgeon: Cammie Sickle., MD;  Location: [place];  Service: Orthopedics;  Laterality: Left;  ulnar nerve decompression vs transposition left elbow    There were no vitals filed for this visit.   Subjective Assessment - 10/27/20 1317    Subjective Pt states he ran today.  No new complaints    Patient Stated Goals be able to empty bladder in less time    Currently in Pain? No/denies                             [facility] Adult PT Treatment/Exercise - 10/27/20 0001      Neuro Re-ed    Neuro Re-ed Details  breathing and bulging with stretches      Exercises   Exercises Lumbar      Lumbar Exercises: Stretches   Active Hamstring Stretch 3 reps;20 seconds    Double Knee to  Chest Stretch 3 reps;20 seconds    Press Ups 3 reps;20 seconds    Figure 4 Stretch 3 reps;20 seconds      Lumbar Exercises: Quadruped   [place] 5 reps    Other Quadruped Lumbar Exercises transversus contraction TC and VC to do on exhale      Manual Therapy   Manual Therapy Soft tissue mobilization    Soft tissue mobilization lumbar paraspinals                  PT Education - 10/27/20 1320    Education Details Access Code: [email or web address]    Person(s) Educated Patient    Methods Explanation;Demonstration;Verbal cues;Handout;Tactile cues    Comprehension Verbalized understanding;Returned demonstration               PT Long Term Goals - 10/15/20 1113      PT LONG TERM GOAL #1   Title Pt will be able start the stream in 2 seconds or less    Baseline can take 10 sec or more    Time 12    Period Weeks    Status New  Target Date 01/07/21      PT LONG TERM GOAL #2   Title Be able to sleep through the night without nocturia    Baseline 1-2/ night    Time 12    Period Weeks    Status New    Target Date 01/07/21      PT LONG TERM GOAL #3   Title Pt will be able to jog 1-2 miles without increased bladder symptoms    Time 12    Period Weeks    Status New    Target Date 01/07/21                 Plan - 10/27/20 1237    Clinical Impression Statement Pt at first treatment since eval so no goals met today.  Today's session focused on intial HEP for improved soft tissue mobility and breathing for increased pelvic floor lengthening and downtraining.  Pt was able to begin core strengthening and did well in qped but required VC for breathing and TC for tightening instead of bulging the abdomen. Pt will benefi tfrom skilled PT to continue to progress strength and flexibility for improved bladder function and emptying.    PT Treatment/Interventions ADLs/Self Care Home Management;Biofeedback;Cryotherapy;Electrical Stimulation;Moist Heat;Therapeutic  activities;Therapeutic exercise;Neuromuscular re-education;Manual techniques;Patient/family education;Dry needling;Passive range of motion;Taping    PT Next Visit Plan review transverse activation, leaning on table and relaxing and tightening; pelvic floor isolation ex's    PT Home Exercise Plan Access Code: [email or web address]    Consulted and Agree with Plan of Care Patient           Patient will benefit from skilled therapeutic intervention in order to improve the following deficits and impairments:  Postural dysfunction,Decreased strength,Impaired flexibility,Pain,Increased muscle spasms,Increased fascial restricitons,Decreased range of motion,Decreased coordination  Visit Diagnosis: Muscle weakness (generalized)  Unspecified lack of coordination  Abnormal posture     Problem List Patient Active Problem List   Diagnosis Date Noted  . Tremor due to multiple drugs 10/02/2020  . Seborrheic dermatitis 10/02/2020  . Polyneuropathy 05/12/2020  . Elevated cholesterol 10/08/2019  . Benign prostatic hyperplasia with nocturia 10/08/2019  . Schizoaffective disorder, depressive type ([facility]) 11/27/2018  . Urinary hesitancy 10/08/2018  . Erectile dysfunction 09/24/2018  . Arm numbness 08/20/2018  . Transient alteration of awareness 08/08/2018  . Tendinopathy of right gluteus medius 05/22/2018  . Pain of right hip joint 05/08/2018  . Hearing difficulty 04/13/2018  . Trochanteric bursitis, right hip 03/23/2018  . Asthma 03/16/2018  . Headache 03/02/2018  . Type 2 diabetes mellitus without complication, without long-term current use of insulin ([facility]) 02/16/2018  . Healthcare maintenance 02/16/2018  . Pain in left leg 10/28/2016  . Lumbosacral radiculopathy at S1 03/15/2016  . Lumbar degenerative disc disease 12/18/2015  . Disc displacement, lumbar 04/21/2015  . Chondromalacia of both patellae 02/24/2015  . Paranoid schizophrenia, chronic condition ([facility]) 02/24/2015  . Right anterior knee pain  07/18/2014  . Right ankle pain 01/27/2014  . Pain in joint, ankle and foot 01/27/2014  . Sciatica 01/10/2012  . Disturbance of skin sensation 11/14/2011    Jakki L , PT 10/27/2020, 2:00 PM  [facility] [address], [address] [place], [place], [address] Phone: [phone]   Fax:  [phone]  Name: Cranston Hashimi MRN: [id number] Date of Birth: 04/25/1966   

## 2020-10-27 NOTE — Patient Instructions (Signed)
Access Code: [email or web address] URL: [email or web address] Date: 10/27/2020 Prepared by: Jari Favre  Exercises Diaphragmatic Breathing in Child's Pose with Pelvic Floor Relaxation - 1 x daily - 7 x weekly - 1 sets - 5 reps - 30 sec hold Hooklying Hamstring Stretch with Strap - 1 x daily - 7 x weekly - 1 sets - 3 reps - 30 sec hold Supine Piriformis Stretch - 1 x daily - 7 x weekly - 1 sets - 3 reps - 30 sec hold Cat-Camel to Child's Pose - 1 x daily - 7 x weekly - 1 sets - 5 reps - 10 sec hold Quadruped Transversus Abdominis Bracing - 1 x daily - 7 x weekly - 2 sets - 10 reps - 5 sec hold

## 2020-11-02 ENCOUNTER — Other Ambulatory Visit: Payer: Self-pay | Admitting: Family Medicine

## 2020-11-03 ENCOUNTER — Encounter: Payer: Self-pay | Admitting: Physical Therapy

## 2020-11-03 ENCOUNTER — Ambulatory Visit: Payer: Medicare Other | Admitting: Physical Therapy

## 2020-11-03 ENCOUNTER — Other Ambulatory Visit: Payer: Self-pay

## 2020-11-03 DIAGNOSIS — R279 Unspecified lack of coordination: Secondary | ICD-10-CM

## 2020-11-03 DIAGNOSIS — R293 Abnormal posture: Secondary | ICD-10-CM | POA: Diagnosis not present

## 2020-11-03 DIAGNOSIS — M6281 Muscle weakness (generalized): Secondary | ICD-10-CM

## 2020-11-03 NOTE — Patient Instructions (Signed)
Access Code: [email or web address] URL: [email or web address] Date: 11/03/2020 Prepared by: Jari Favre  Exercises Diaphragmatic Breathing in Child's Pose with Pelvic Floor Relaxation - 1 x daily - 7 x weekly - 1 sets - 5 reps - 30 sec hold Hooklying Hamstring Stretch with Strap - 1 x daily - 7 x weekly - 1 sets - 3 reps - 30 sec hold Supine Piriformis Stretch - 1 x daily - 7 x weekly - 1 sets - 3 reps - 30 sec hold Cat-Camel to Child's Pose - 1 x daily - 7 x weekly - 1 sets - 5 reps - 10 sec hold Hooklying Transversus Abdominis Palpation - 1 x daily - 7 x weekly - 3 sets - 10 reps

## 2020-11-03 NOTE — Therapy (Signed)
[facility] [address] [place], [name], [address] Phone: [phone]   Fax:  [phone]  Physical Therapy Treatment  Patient Details  Name: Glenn Robertson MRN: [id number] Date of Birth: 05/12/1966 Referring Provider (PT): Lucas Mallow, MD   Encounter Date: 11/03/2020   PT End of Session - 11/03/20 0805    Visit Number 3    Date for PT Re-Evaluation 01/07/21    Authorization Type medicare/medicaid    PT Start Time 0805    PT Stop Time 0840    PT Time Calculation (min) 35 min    Activity Tolerance Patient tolerated treatment well    Behavior During Therapy [facility] for tasks assessed/performed           Past Medical History:  Diagnosis Date  . Allergy    dog and cats, citrus  . Anxiety   . Asthma   . Chronic pain   . Depression   . Diabetes mellitus   . Glaucoma   . Neuromuscular disorder ([place])   . Paranoid schizophrenia [facility])     Past Surgical History:  Procedure Laterality Date  . ANKLE ARTHROSCOPY    . Arm Surgery  1/12   rt ulnar nerve decompression  . EPIDURAL BLOCK INJECTION     several  . ULNAR NERVE TRANSPOSITION  01/19/2012   Procedure: ULNAR NERVE DECOMPRESSION/TRANSPOSITION;  Surgeon: Cammie Sickle., MD;  Location: [place];  Service: Orthopedics;  Laterality: Left;  ulnar nerve decompression vs transposition left elbow    There were no vitals filed for this visit.   Subjective Assessment - 11/03/20 0808    Subjective I am still running.  States he is still having the same amount of symptoms    Currently in Pain? No/denies                             [facility] Adult PT Treatment/Exercise - 11/03/20 0001      Neuro Re-ed    Neuro Re-ed Details  breathing slowly for diaphragmatic breathing, transversus activation      Lumbar Exercises: Stretches   Other Lumbar Stretch Exercise child pose and cat cow with breathing      Lumbar Exercises: Supine    AB Set Limitations core set in hooklying - educated with max cues VC, TC - performed correctly at the end of session - performed 10x with UE overhead      Lumbar Exercises: Quadruped   Other Quadruped Lumbar Exercises transversus contraction TC and VC to do on exhale, did a lot of bulging      Manual Therapy   Soft tissue mobilization lumbar paraspinals, gluteals bilat                  PT Education - 11/03/20 0838    Education Details Access Code: [email or web address]    Person(s) Educated Patient    Methods Explanation;Demonstration;Tactile cues;Verbal cues;Handout    Comprehension Verbalized understanding;Returned demonstration               PT Long Term Goals - 11/03/20 0858      PT LONG TERM GOAL #1   Title Pt will be able start the stream in 2 seconds or less    Status On-going      PT LONG TERM GOAL #2   Title Be able to sleep through the night without nocturia    Baseline 1-2/ night  Status On-going      PT LONG TERM GOAL #3   Title Pt will be able to jog 1-2 miles without increased bladder symptoms    Baseline had one episode of feeling like he would have leakage 1.4 miles    Status On-going      PT LONG TERM GOAL #4   Title FOTO to 38% limited    Status On-going                 Plan - 11/03/20 0845    Clinical Impression Statement Pt has been working on initial HEP since previous visit.  He needed review with the stretches and ex's in qped as he was not getting lumbar flexion and was bulging abdoman and holding his breath.  He was able to get a good stretch in child's pose with cues to sit back and breathe slowly.  Pt had a hard time with transversus activation in qped even with max cueing.  Pt was advised to do transverse activation in supine instead of qped and was able to do this successfully.  Pt was fatigued at the end of the session today and more relaxed through low back.    PT Treatment/Interventions ADLs/Self Care Home  Management;Biofeedback;Cryotherapy;Electrical Stimulation;Moist Heat;Therapeutic activities;Therapeutic exercise;Neuromuscular re-education;Manual techniques;Patient/family education;Dry needling;Passive range of motion;Taping    PT Next Visit Plan progress core strength lumbar mobility, pelvic tilts against the wall, UE extension    PT Home Exercise Plan Access Code: [email or web address]    Consulted and Agree with Plan of Care Patient           Patient will benefit from skilled therapeutic intervention in order to improve the following deficits and impairments:  Postural dysfunction,Decreased strength,Impaired flexibility,Pain,Increased muscle spasms,Increased fascial restricitons,Decreased range of motion,Decreased coordination  Visit Diagnosis: Muscle weakness (generalized)  Unspecified lack of coordination  Abnormal posture     Problem List Patient Active Problem List   Diagnosis Date Noted  . Tremor due to multiple drugs 10/02/2020  . Seborrheic dermatitis 10/02/2020  . Polyneuropathy 05/12/2020  . Elevated cholesterol 10/08/2019  . Benign prostatic hyperplasia with nocturia 10/08/2019  . Schizoaffective disorder, depressive type ([place]) 11/27/2018  . Urinary hesitancy 10/08/2018  . Erectile dysfunction 09/24/2018  . Arm numbness 08/20/2018  . Transient alteration of awareness 08/08/2018  . Tendinopathy of right gluteus medius 05/22/2018  . Pain of right hip joint 05/08/2018  . Hearing difficulty 04/13/2018  . Trochanteric bursitis, right hip 03/23/2018  . Asthma 03/16/2018  . Headache 03/02/2018  . Type 2 diabetes mellitus without complication, without long-term current use of insulin ([place]) 02/16/2018  . Healthcare maintenance 02/16/2018  . Pain in left leg 10/28/2016  . Lumbosacral radiculopathy at S1 03/15/2016  . Lumbar degenerative disc disease 12/18/2015  . Disc displacement, lumbar 04/21/2015  . Chondromalacia of both patellae 02/24/2015  . Paranoid schizophrenia,  chronic condition ([place]) 02/24/2015  . Right anterior knee pain 07/18/2014  . Right ankle pain 01/27/2014  . Pain in joint, ankle and foot 01/27/2014  . Sciatica 01/10/2012  . Disturbance of skin sensation 11/14/2011    Jule Ser, PT 11/03/2020, 9:09 AM  [facility] [address] [place], [name], [address] Phone: [phone]   Fax:  [phone]  Name: Braxley Balandran MRN: [id number] Date of Birth: 05-29-1966

## 2020-11-04 ENCOUNTER — Telehealth: Payer: Self-pay

## 2020-11-04 MED ORDER — ACCU-CHEK AVIVA PLUS VI STRP: ORAL_STRIP | 2 refills | Status: DC

## 2020-11-04 NOTE — Telephone Encounter (Signed)
Requested Rx sent in for refill patient aware

## 2020-11-04 NOTE — Telephone Encounter (Signed)
Pt needs a refill for ACCU-CHEK AVIVA PLUS test strip   Send to [facility] on [address]  Thank you

## 2020-11-05 ENCOUNTER — Telehealth: Payer: Self-pay | Admitting: Family Medicine

## 2020-11-05 NOTE — Telephone Encounter (Signed)
Pt called and said that [facility] told him that Dr Ethelene Hal needs to fill out a CCM form in order for pts test strips to be filled, pt said he is is out. [facility] number [phone]

## 2020-11-06 NOTE — Telephone Encounter (Signed)
Patient is calling to check the status of previous message.

## 2020-11-09 ENCOUNTER — Encounter: Payer: Self-pay | Admitting: Family Medicine

## 2020-11-09 ENCOUNTER — Other Ambulatory Visit: Payer: Self-pay

## 2020-11-09 ENCOUNTER — Ambulatory Visit (INDEPENDENT_AMBULATORY_CARE_PROVIDER_SITE_OTHER): Payer: Medicare Other | Admitting: Family Medicine

## 2020-11-09 VITALS — BP 116/74 | HR 73 | Temp 97.5°F | Ht 71.0 in | Wt 203.6 lb

## 2020-11-09 DIAGNOSIS — L219 Seborrheic dermatitis, unspecified: Secondary | ICD-10-CM

## 2020-11-09 DIAGNOSIS — E119 Type 2 diabetes mellitus without complications: Secondary | ICD-10-CM

## 2020-11-09 MED ORDER — TRIAMCINOLONE ACETONIDE 0.1 % EX CREA: 1.0000 "application " | TOPICAL_CREAM | Freq: Two times a day (BID) | CUTANEOUS | 0 refills | Status: DC

## 2020-11-09 NOTE — Progress Notes (Signed)
[facility] [address] [place] [address] Dept: [phone] Dept Fax: [phone]  Acute Office Visit  Subjective:    Patient ID: Glenn Robertson, male    DOB: 07-17-1966, 55 y.o..   MRN: [id number]  Chief Complaint  Patient presents with  . Follow-up    F/u diabetes.  Average BS 90-120. Also c/o having sores on the outer part of both ears x 1 year.     History of Present Illness:  Patient is in today noting continued rash on his left outer ear. He states he did apply hydrocortisone cream on this area as discussed. He notes that occasionally he has some bleeding from the site. He thinks this occurs when he cleans it too vigorously. He also notes a chronic scabbed area over the nape of his neck, which has been present for some months.  Mr. Pafford also states he is having trouble acquiring his test strips for his Accucheck home glucose monitor. He states that he was told by his pharmacy that he has to have paperwork submitted to approve for his use. He notes that he may have sent paperwork here to our clinic. He is currently testing his blood sugars TID (fasting, prior to and after exercise). He states his usual blood sugars are in the 90-120 range. He occasionally notes a low blood sugar.  Past Medical History: Patient Active Problem List   Diagnosis Date Noted  . Tremor due to multiple drugs 10/02/2020  . Seborrheic dermatitis 10/02/2020  . Polyneuropathy 05/12/2020  . Elevated cholesterol 10/08/2019  . Benign prostatic hyperplasia with nocturia 10/08/2019  . Schizoaffective disorder, depressive type ([place]) 11/27/2018  . Urinary hesitancy 10/08/2018  . Erectile dysfunction 09/24/2018  . Arm numbness 08/20/2018  . Transient alteration of awareness 08/08/2018  . Tendinopathy of right gluteus medius 05/22/2018  . Pain of right hip joint 05/08/2018  . Hearing difficulty 04/13/2018  . Trochanteric bursitis, right hip  03/23/2018  . Asthma 03/16/2018  . Headache 03/02/2018  . Type 2 diabetes mellitus without complication, without long-term current use of insulin ([place]) 02/16/2018  . Healthcare maintenance 02/16/2018  . Pain in left leg 10/28/2016  . Lumbosacral radiculopathy at S1 03/15/2016  . Lumbar degenerative disc disease 12/18/2015  . Disc displacement, lumbar 04/21/2015  . Chondromalacia of both patellae 02/24/2015  . Paranoid schizophrenia, chronic condition ([place]) 02/24/2015  . Right anterior knee pain 07/18/2014  . Right ankle pain 01/27/2014  . Pain in joint, ankle and foot 01/27/2014  . Sciatica 01/10/2012  . Disturbance of skin sensation 11/14/2011   Past Surgical History:  Procedure Laterality Date  . ANKLE ARTHROSCOPY    . Arm Surgery  1/12   rt ulnar nerve decompression  . EPIDURAL BLOCK INJECTION     several  . ULNAR NERVE TRANSPOSITION  01/19/2012   Procedure: ULNAR NERVE DECOMPRESSION/TRANSPOSITION;  Surgeon: Cammie Sickle., MD;  Location: [facility];  Service: Orthopedics;  Laterality: Left;  ulnar nerve decompression vs transposition left elbow   Family History  Problem Relation Age of Onset  . Diabetes Father   . Diabetes Paternal Uncle   . Colon cancer Neg Hx   . Colon polyps Neg Hx   . Esophageal cancer Neg Hx   . Rectal cancer Neg Hx   . Stomach cancer Neg Hx    Outpatient Medications Prior to Visit  Medication Sig Dispense Refill  . ACCU-CHEK SOFTCLIX LANCETS lancets 3 (three) times daily. for testing  0  .  albuterol (VENTOLIN HFA) 108 (90 Base) MCG/ACT inhaler INHALE 2 PUFFS BY MOUTH INTO THE LUNGS AS NEEDED 18 g 5  . amantadine (SYMMETREL) 100 MG capsule TAKE 1 CAPSULE(100 MG) BY MOUTH TWICE DAILY 180 capsule 0  . Cariprazine HCl (VRAYLAR) 6 MG CAPS TAKE 1 CAPSULE(6 MG) BY MOUTH DAILY 30 capsule 5  . clotrimazole-betamethasone (LOTRISONE) cream Apply 1 application topically 2 (two) times daily. 45 g 0  . fluPHENAZine (PROLIXIN) 5 MG tablet  TAKE 1 TABLET(5 MG) BY MOUTH AT BEDTIME 30 tablet 5  . fluticasone (FLONASE) 50 MCG/ACT nasal spray SHAKE LIQUID AND USE 2 SPRAYS IN EACH NOSTRIL DAILY 16 g 6  . glipiZIDE (GLUCOTROL) 10 MG tablet TAKE 1 TABLET(10 MG) BY MOUTH TWICE DAILY 180 tablet 3  . glucose blood (ACCU-CHEK AVIVA PLUS) test strip TEST 3 TIMES DAILY 100 strip 2  . JARDIANCE 25 MG TABS tablet TAKE 1 TABLET BY MOUTH DAILY 90 tablet 2  . lamoTRIgine (LAMICTAL) 100 MG tablet Take 1/2 tablet by mouth daily for 5 days. Then 1/2 tablet twice daily for 5 days. Then 1/2 tablet three times daily for 5 days. Then 1 tablet twice daily thereafter. 60 tablet 3  . LUMIGAN 0.01 % SOLN INSTILL 1 DROP INTO AFFECTED EYE ONCE AT BEDTIME    . meloxicam (MOBIC) 15 MG tablet Take 1 tablet (15 mg total) by mouth daily. 30 tablet 3  . metFORMIN (GLUCOPHAGE-XR) 500 MG 24 hr tablet TAKE 2 TABLETS(1000 MG) BY MOUTH TWICE DAILY 360 tablet 1  . montelukast (SINGULAIR) 10 MG tablet TAKE 1 TABLET(10 MG) BY MOUTH AT BEDTIME 90 tablet 3  . RESTASIS 0.05 % ophthalmic emulsion     . sertraline (ZOLOFT) 100 MG tablet TAKE 1 TABLET(100 MG) BY MOUTH DAILY 30 tablet 5  . simvastatin (ZOCOR) 20 MG tablet TAKE 1 TABLET(20 MG) BY MOUTH EVERY EVENING 90 tablet 3  . tadalafil (CIALIS) 20 MG tablet TAKE ONE-HALF TO ONE TABLET BY MOUTH EVERY OTHER DAY AS NEEDED FOR ERECTILE DYSFUNCTION 10 tablet 4  . TYRVAYA 0.03 MG/ACT SOLN Spray 1 spray in each nostril twice daily, approximately 12 hours apart    . clonazePAM (KLONOPIN) 1 MG tablet Take 1 tablet (1 mg total) by mouth 3 (three) times daily as needed for anxiety. for anxiety 90 tablet 2   No facility-administered medications prior to visit.   Allergies  Allergen Reactions  . Citric Acid Other (See Comments)    Runny nose, eyes water  . Food     Oranges or anything with citric acid  . Gabapentin     Walking into things, hard to maintain balance, falls    Objective:   Today's Vitals   11/09/20 0937  BP: 116/74   Pulse: 73  Temp: (!) 97.5 F (36.4 C)  TempSrc: Temporal  SpO2: 96%  Weight: 203 lb 9.6 oz (92.4 kg)  Height: 5\' 11"  (1.803 m)   Body mass index is 28.4 kg/m.   General: Well developed, well nourished. No acute distress. Skin: Warm and dry. There are several small papular lesions within the fold of the helix and antihelix. These have small   eschar present. There is a superficial sore with an eschar over the right nape of the neck, within the hair. Psych: Alert and oriented. Normal mood and affect.  Health Maintenance Due  Topic Date Due  . PNEUMOCOCCAL POLYSACCHARIDE VACCINE AGE 85-64 HIGH RISK  Never done  . COVID-19 Vaccine (3 - Booster for Pfizer series) 06/04/2020  .  FOOT EXAM  07/07/2020  . URINE MICROALBUMIN  10/07/2020      Assessment & Plan:   1. Seborrheic dermatitis Since he has not fully responded to HC cream, I will switch to TAC cream to see if the more potent steroid will work. He can include applying this to the area t the nape of the neck.  2. Type 2 diabetes mellitus without complication, without long-term current use of insulin (HCC) I will check with Dr. Ethelene Hal and his MA about the test strip paperwork and ask that they assist Mr. Mannor in obtaining his test strips. He did have a change in his therapy prescribed by Ms. Webb in Jan. and will be due for his 3-month follow-up with Dr. Ethelene Hal in April.    Haydee Salter, MD

## 2020-11-09 NOTE — Telephone Encounter (Signed)
Called and spoke with pharmacist who states that CCM form needs to be filled out. They will all and have form faxed to be filled out by PCP.

## 2020-11-10 ENCOUNTER — Other Ambulatory Visit: Payer: Self-pay

## 2020-11-10 MED ORDER — ACCU-CHEK AVIVA PLUS VI STRP: 1.0000 | ORAL_STRIP | Freq: Every day | 0 refills | Status: DC

## 2020-11-10 NOTE — Telephone Encounter (Signed)
Patient is calling to check the status of his test strips. Please call him back at [phone].

## 2020-11-10 NOTE — Telephone Encounter (Signed)
Spoke with patient who verbally understood form filled out and faxed back to [employer]. Patient agrees to give them a call to check on Rx

## 2020-11-11 ENCOUNTER — Ambulatory Visit (INDEPENDENT_AMBULATORY_CARE_PROVIDER_SITE_OTHER): Payer: Medicare Other | Admitting: Surgery

## 2020-11-11 ENCOUNTER — Ambulatory Visit: Payer: Self-pay

## 2020-11-11 ENCOUNTER — Encounter: Payer: Self-pay | Admitting: Surgery

## 2020-11-11 ENCOUNTER — Telehealth: Payer: Self-pay | Admitting: Family Medicine

## 2020-11-11 ENCOUNTER — Other Ambulatory Visit: Payer: Self-pay

## 2020-11-11 VITALS — BP 111/76 | HR 70 | Ht 71.0 in | Wt 203.6 lb

## 2020-11-11 DIAGNOSIS — S300XXA Contusion of lower back and pelvis, initial encounter: Secondary | ICD-10-CM

## 2020-11-11 DIAGNOSIS — M5136 Other intervertebral disc degeneration, lumbar region: Secondary | ICD-10-CM

## 2020-11-11 NOTE — Telephone Encounter (Signed)
Pt called and requested call back from nurse, please advise. Call back [phone]

## 2020-11-11 NOTE — Progress Notes (Signed)
Office Visit Note   Patient: Glenn Robertson           Date of Birth: [birth year]           MRN: [id number] Visit Date: 11/11/2020              Requested by: Libby Maw, MD [address] [place],  [place] [address] PCP: Libby Maw, MD   Assessment & Plan: Visit Diagnoses:  1. Lumbar degenerative disc disease   2. Contusion of buttock, initial encounter     Plan: Advised patient that I think his pain related to contusion after his fall.  This should resolve over the next week or so.  I will have him follow-up with me in 4 weeks for recheck.  If he is doing well he may cancel his next appointment.  He is has had ongoing chronic issues with his lumbar spine and if this continues to be a problem we did discuss scheduling MRI.  I reviewed patient's chart and he has been following neurology for chronic balance issues.  Patient states that this did improve for some after changing his gabapentin.  The neurologist made some mention of possibly getting a cervical spine MRI to rule out myelopathy if he continued to have balance issues.  I advised patient that he should contact their office if this continues to be an ongoing problem.  He voices understanding.  Follow-Up Instructions: Return in about 4 weeks (around 12/09/2020).   Orders:  Orders Placed This Encounter  Procedures  . XR Lumbar Spine 2-3 Views   No orders of the defined types were placed in this encounter.     Procedures: No procedures performed   Clinical Data: No additional findings.   Subjective: Chief Complaint  Patient presents with  . Lower Back - Follow-up    HPI 55 year old black male returns with complaints of low back pain and left buttock pain.  Patient states that few days ago he tripped in his driveway falling backwards onto a brick.  States that he hit the brick on his left buttock.  States that this has been improving.  Denies lower extremity radicular symptoms.  Dr. Lorin Mercy has  seen him in the past for low back pain. Review of Systems No current cardiopulmonary GI GU issues  Objective: Vital Signs: BP 111/76   Pulse 70   Ht 5\' 11"  (1.803 m)   Wt 203 lb 9.6 oz (92.4 kg)   BMI 28.40 kg/m   Physical Exam HENT:     Head: Normocephalic.  Eyes:     Extraocular Movements: Extraocular movements intact.  Pulmonary:     Effort: No respiratory distress.  Musculoskeletal:     Comments: Gait is normal.  Lumbar spine and bilateral buttocks nontender.  Good painless range of motion bilateral hips.  Negative straight leg raise.  Patient has lumbar flexion with hands to toes.  Good painless lumbar extension.  Neurologically intact.  No focal motor deficits.  Neurological:     General: No focal deficit present.     Mental Status: He is alert and oriented to person, place, and time.     Ortho Exam  Specialty Comments:  No specialty comments available.  Imaging: No results found.   PMFS History: Patient Active Problem List   Diagnosis Date Noted  . Tremor due to multiple drugs 10/02/2020  . Seborrheic dermatitis 10/02/2020  . Polyneuropathy 05/12/2020  . Elevated cholesterol 10/08/2019  . Benign prostatic hyperplasia with nocturia 10/08/2019  .  Schizoaffective disorder, depressive type ([place]) 11/27/2018  . Urinary hesitancy 10/08/2018  . Erectile dysfunction 09/24/2018  . Arm numbness 08/20/2018  . Transient alteration of awareness 08/08/2018  . Tendinopathy of right gluteus medius 05/22/2018  . Pain of right hip joint 05/08/2018  . Hearing difficulty 04/13/2018  . Trochanteric bursitis, right hip 03/23/2018  . Asthma 03/16/2018  . Headache 03/02/2018  . Type 2 diabetes mellitus without complication, without long-term current use of insulin ([place]) 02/16/2018  . Healthcare maintenance 02/16/2018  . Pain in left leg 10/28/2016  . Lumbosacral radiculopathy at S1 03/15/2016  . Lumbar degenerative disc disease 12/18/2015  . Disc displacement, lumbar  04/21/2015  . Chondromalacia of both patellae 02/24/2015  . Paranoid schizophrenia, chronic condition ([place]) 02/24/2015  . Right anterior knee pain 07/18/2014  . Right ankle pain 01/27/2014  . Pain in joint, ankle and foot 01/27/2014  . Sciatica 01/10/2012  . Disturbance of skin sensation 11/14/2011   Past Medical History:  Diagnosis Date  . Allergy    dog and cats, citrus  . Anxiety   . Asthma   . Chronic pain   . Depression   . Diabetes mellitus   . Glaucoma   . Neuromuscular disorder ([place])   . Paranoid schizophrenia ([place])     Family History  Problem Relation Age of Onset  . Diabetes Father   . Diabetes Paternal Uncle   . Colon cancer Neg Hx   . Colon polyps Neg Hx   . Esophageal cancer Neg Hx   . Rectal cancer Neg Hx   . Stomach cancer Neg Hx     Past Surgical History:  Procedure Laterality Date  . ANKLE ARTHROSCOPY    . Arm Surgery  1/12   rt ulnar nerve decompression  . EPIDURAL BLOCK INJECTION     several  . ULNAR NERVE TRANSPOSITION  01/19/2012   Procedure: ULNAR NERVE DECOMPRESSION/TRANSPOSITION;  Surgeon: Cammie Sickle., MD;  Location: [facility];  Service: Orthopedics;  Laterality: Left;  ulnar nerve decompression vs transposition left elbow   Social History   Occupational History  . Occupation: Unemlpoyed-disabled  Tobacco Use  . Smoking status: Never Smoker  . Smokeless tobacco: Never Used  Vaping Use  . Vaping Use: Never used  Substance and Sexual Activity  . Alcohol use: No  . Drug use: No  . Sexual activity: Not on file

## 2020-11-11 NOTE — Telephone Encounter (Signed)
Spoke with patient who verbally understood forms have been faxed twice and I spoke with pharmacist today who states that they have received the forms.

## 2020-11-12 ENCOUNTER — Encounter: Payer: Self-pay | Admitting: Physical Therapy

## 2020-11-12 ENCOUNTER — Ambulatory Visit: Payer: Medicare Other | Admitting: Physical Therapy

## 2020-11-12 DIAGNOSIS — R279 Unspecified lack of coordination: Secondary | ICD-10-CM

## 2020-11-12 DIAGNOSIS — R293 Abnormal posture: Secondary | ICD-10-CM | POA: Diagnosis not present

## 2020-11-12 DIAGNOSIS — M6281 Muscle weakness (generalized): Secondary | ICD-10-CM | POA: Diagnosis not present

## 2020-11-12 NOTE — Therapy (Signed)
[facility] [address], [place], [name], [address] Phone: [phone]   Fax:  [phone]  Physical Therapy Treatment  Patient Details  Name: Glenn Robertson MRN: [id number] Date of Birth: [birth year] Referring Provider (PT): Lucas Mallow, MD   Encounter Date: 11/12/2020   PT End of Session - 11/12/20 1625    Visit Number 4    Date for PT Re-Evaluation 01/07/21    Authorization Type medicare/medicaid    PT Start Time 1618    PT Stop Time 1700    PT Time Calculation (min) 42 min    Activity Tolerance Patient tolerated treatment well    Behavior During Therapy [facility] for tasks assessed/performed           Past Medical History:  Diagnosis Date  . Allergy    dog and cats, citrus  . Anxiety   . Asthma   . Chronic pain   . Depression   . Diabetes mellitus   . Glaucoma   . Neuromuscular disorder ([place])   . Paranoid schizophrenia [facility])     Past Surgical History:  Procedure Laterality Date  . ANKLE ARTHROSCOPY    . Arm Surgery  1/12   rt ulnar nerve decompression  . EPIDURAL BLOCK INJECTION     several  . ULNAR NERVE TRANSPOSITION  01/19/2012   Procedure: ULNAR NERVE DECOMPRESSION/TRANSPOSITION;  Surgeon: Cammie Sickle., MD;  Location: [facility];  Service: Orthopedics;  Laterality: Left;  ulnar nerve decompression vs transposition left elbow    There were no vitals filed for this visit.   Subjective Assessment - 11/12/20 1622    Subjective Pt states he had a hard time with the transversus abdominus contraction . pt has tailbone pain since falling but went to the doctor yesterday and states it did not show anything.    Patient Stated Goals be able to empty bladder in less time    Currently in Pain? No/denies                             [facility] Adult PT Treatment/Exercise - 11/12/20 0001      Self-Care   Self-Care Other Self-Care Comments    Other Self-Care  Comments  massage to gluteals on foam roller - pt has one at home      Lumbar Exercises: Aerobic   Nustep L1 x 6 min PT presnet for status update      Lumbar Exercises: Supine   AB Set Limitations core set on foam roll - arm pulses, alt UE    Bent Knee Raise 10 reps    Bent Knee Raise Limitations arm press on thigh    Large Ball Abdominal Isometric 10 reps    Large Ball Abdominal Isometric Limitations UE overhead with large ball; LE roll out                       PT Long Term Goals - 11/12/20 1723      PT LONG TERM GOAL #1   Title Pt will be able start the stream in 2 seconds or less    Status On-going      PT LONG TERM GOAL #2   Title Be able to sleep through the night without nocturia    Status On-going      PT LONG TERM GOAL #3   Title Pt will be able to jog 1-2  miles without increased bladder symptoms    Baseline tailbone pain, no jogging right now    Status On-going                 Plan - 11/12/20 1717    Clinical Impression Statement Today's session with more focus on core.  Pt demonstrates fatigue at the end of the session today.  HE was given progression of core strength and did well with supine on foam roll . Cat cow reviewed and he still has a hard time with arching and very tight lumbar extensors. Pt still has not been able to do much core strengthening at home as he was not sure about how to do the exercise from last week.  New exercises added this week and he will benefit from skilled PT to continue to progress for greater core strengthening and address bladder control.    PT Treatment/Interventions ADLs/Self Care Home Management;Biofeedback;Cryotherapy;Electrical Stimulation;Moist Heat;Therapeutic activities;Therapeutic exercise;Neuromuscular re-education;Manual techniques;Patient/family education;Dry needling;Passive range of motion;Taping    PT Next Visit Plan progress core strength lumbar mobility, pelvic tilts against the wall, review goals     PT Home Exercise Plan Access Code: [email or web address]    Consulted and Agree with Plan of Care Patient           Patient will benefit from skilled therapeutic intervention in order to improve the following deficits and impairments:  Postural dysfunction,Decreased strength,Impaired flexibility,Pain,Increased muscle spasms,Increased fascial restricitons,Decreased range of motion,Decreased coordination  Visit Diagnosis: Muscle weakness (generalized)  Unspecified lack of coordination  Abnormal posture     Problem List Patient Active Problem List   Diagnosis Date Noted  . Tremor due to multiple drugs 10/02/2020  . Seborrheic dermatitis 10/02/2020  . Polyneuropathy 05/12/2020  . Elevated cholesterol 10/08/2019  . Benign prostatic hyperplasia with nocturia 10/08/2019  . Schizoaffective disorder, depressive type ([place]) 11/27/2018  . Urinary hesitancy 10/08/2018  . Erectile dysfunction 09/24/2018  . Arm numbness 08/20/2018  . Transient alteration of awareness 08/08/2018  . Tendinopathy of right gluteus medius 05/22/2018  . Pain of right hip joint 05/08/2018  . Hearing difficulty 04/13/2018  . Trochanteric bursitis, right hip 03/23/2018  . Asthma 03/16/2018  . Headache 03/02/2018  . Type 2 diabetes mellitus without complication, without long-term current use of insulin ([place]) 02/16/2018  . Healthcare maintenance 02/16/2018  . Pain in left leg 10/28/2016  . Lumbosacral radiculopathy at S1 03/15/2016  . Lumbar degenerative disc disease 12/18/2015  . Disc displacement, lumbar 04/21/2015  . Chondromalacia of both patellae 02/24/2015  . Paranoid schizophrenia, chronic condition ([place]) 02/24/2015  . Right anterior knee pain 07/18/2014  . Right ankle pain 01/27/2014  . Pain in joint, ankle and foot 01/27/2014  . Sciatica 01/10/2012  . Disturbance of skin sensation 11/14/2011    Jule Ser, PT 11/12/2020, 5:26 PM  [facility] [address] [place], [name], [address] Phone: [phone]   Fax:  [phone]  Name: Glenn Robertson MRN: [id number] Date of Birth: May 29, 1966

## 2020-11-13 ENCOUNTER — Other Ambulatory Visit (HOSPITAL_COMMUNITY): Payer: Self-pay | Admitting: Psychiatry

## 2020-11-16 ENCOUNTER — Ambulatory Visit (INDEPENDENT_AMBULATORY_CARE_PROVIDER_SITE_OTHER): Payer: Medicare Other | Admitting: Psychology

## 2020-11-16 DIAGNOSIS — F411 Generalized anxiety disorder: Secondary | ICD-10-CM

## 2020-11-16 DIAGNOSIS — F331 Major depressive disorder, recurrent, moderate: Secondary | ICD-10-CM

## 2020-11-16 DIAGNOSIS — F4310 Post-traumatic stress disorder, unspecified: Secondary | ICD-10-CM | POA: Diagnosis not present

## 2020-11-16 DIAGNOSIS — F200 Paranoid schizophrenia: Secondary | ICD-10-CM | POA: Diagnosis not present

## 2020-11-17 ENCOUNTER — Telehealth: Payer: Self-pay | Admitting: Neurology

## 2020-11-17 NOTE — Telephone Encounter (Signed)
Pt is needing a refill on his lamoTRIgine (LAMICTAL) 100 MG tablet sent in to the [facility] on [address]  Pt also states that something has been making him real gassy and is maybe thinking it is the lamoTRIgine (LAMICTAL) 100 MG tablet Please advise.

## 2020-11-17 NOTE — Telephone Encounter (Signed)
Dr. Felecia Shelling- I was reviewing his chart. He has no follow up. Were you planning on continuing to refill this for him. If so, when would you like me to make a follow up? What would you recommend about SE?

## 2020-11-17 NOTE — Telephone Encounter (Signed)
Ok to fill for 6 months -- he should see either me or Amy in next few months

## 2020-11-18 ENCOUNTER — Encounter: Payer: Medicare Other | Admitting: Physical Therapy

## 2020-11-18 MED ORDER — LAMOTRIGINE 100 MG PO TABS: ORAL_TABLET | ORAL | 5 refills | Status: DC

## 2020-11-24 ENCOUNTER — Encounter: Payer: Medicare Other | Admitting: Physical Therapy

## 2020-11-24 ENCOUNTER — Other Ambulatory Visit: Payer: Self-pay

## 2020-11-24 ENCOUNTER — Telehealth (INDEPENDENT_AMBULATORY_CARE_PROVIDER_SITE_OTHER): Payer: Medicare Other | Admitting: Psychiatry

## 2020-11-24 DIAGNOSIS — F251 Schizoaffective disorder, depressive type: Secondary | ICD-10-CM | POA: Diagnosis not present

## 2020-11-24 DIAGNOSIS — R404 Transient alteration of awareness: Secondary | ICD-10-CM

## 2020-11-24 MED ORDER — CLONAZEPAM 1 MG PO TABS
1.0000 mg | ORAL_TABLET | Freq: Three times a day (TID) | ORAL | 2 refills | Status: DC | PRN
Start: 2020-11-24 — End: 2021-01-27

## 2020-11-24 MED ORDER — FLUPHENAZINE HCL 5 MG PO TABS: ORAL_TABLET | ORAL | 0 refills | Status: DC

## 2020-11-24 MED ORDER — VRAYLAR 6 MG PO CAPS: ORAL_CAPSULE | ORAL | 5 refills | Status: DC

## 2020-11-24 MED ORDER — SERTRALINE HCL 100 MG PO TABS: 100.0000 mg | ORAL_TABLET | Freq: Every day | ORAL | 1 refills | Status: DC

## 2020-11-24 NOTE — Progress Notes (Signed)
[place] MD/PA/NP OP Progress Note  11/24/2020 8:43 AM Glenn Robertson  MRN:  [id number] Interview was conducted by phone application and I verified that I was speaking with the correct person using two identifiers. I discussed the limitations of evaluation and management by telemedicine and  the availability of in person appointments. Patient expressed understanding and agreed to proceed. Participants in the visit: patient (location - home); physician (location - home office).  Chief Complaint: "I'm just bored".  HPI: 55yo singleAAMwith schizophrenia paranoid type vsschizoaffective disorder. Patient has no hx of suicidal attempts but admits tohx ofon and off SI, he has a long hx of depression and excessive worrying as well as paranoid and grandiose delusions. He expressed havingsome bizarre perceptual disturbances e.g. that he suddenly stops hearing someone who he is talking to or the volume of conversation gradually decreases.He also reports brief, infrequent periods that he loses track of time. He has seen a neurologist for this in the past but "they did not find anything".We continuedclonazepam prn anxiety. Dc [name] (no benefit) and insteadaddedVraylarforparanoia.Heappearedless paranoid though still anxious (takes clonazepambid ortiddepending on a day).We have added Prolixin 5 mg at bedtimewhen he became paranoid last December. Hetolerates well and reports decrease of paranoid fears.Denies having AH. He developed mild tremor a month or so ago - amantadine was added and tremor resolved. His sleepisfair.Hemoglobin A1C decreased to 6.9%, no changes in appetite.On lamotrigine for polyneuropathic pain.   Visit Diagnosis:    ICD-10-CM   1. Schizoaffective disorder, depressive type ([place])  F25.1   2. Transient alteration of awareness  R40.4 clonazePAM (KLONOPIN) 1 MG tablet    Past Psychiatric History: Please see intake H&P.  Past Medical History:  Past Medical History:   Diagnosis Date  . Allergy    dog and cats, citrus  . Anxiety   . Asthma   . Chronic pain   . Depression   . Diabetes mellitus   . Glaucoma   . Neuromuscular disorder ([place])   . Paranoid schizophrenia [facility])     Past Surgical History:  Procedure Laterality Date  . ANKLE ARTHROSCOPY    . Arm Surgery  1/12   rt ulnar nerve decompression  . EPIDURAL BLOCK INJECTION     several  . ULNAR NERVE TRANSPOSITION  01/19/2012   Procedure: ULNAR NERVE DECOMPRESSION/TRANSPOSITION;  Surgeon: Cammie Sickle., MD;  Location: [facility];  Service: Orthopedics;  Laterality: Left;  ulnar nerve decompression vs transposition left elbow    Family Psychiatric History: None.  Family History:  Family History  Problem Relation Age of Onset  . Diabetes Father   . Diabetes Paternal Uncle   . Colon cancer Neg Hx   . Colon polyps Neg Hx   . Esophageal cancer Neg Hx   . Rectal cancer Neg Hx   . Stomach cancer Neg Hx     Social History:  Social History   Socioeconomic History  . Marital status: Single    Spouse name: Not on file  . Number of children: 0  . Years of education: BA  . Highest education level: Not on file  Occupational History  . Occupation: Unemlpoyed-disabled  Tobacco Use  . Smoking status: Never Smoker  . Smokeless tobacco: Never Used  Vaping Use  . Vaping Use: Never used  Substance and Sexual Activity  . Alcohol use: No  . Drug use: No  . Sexual activity: Not on file  Other Topics Concern  . Not on file  Social History Narrative  Lives with mother   Caffeine use: Coke very rare   Right handed    Social Determinants of Health   Financial Resource Strain: Low Risk   . Difficulty of Paying Living Expenses: Not hard at all  Food Insecurity: No Food Insecurity  . Worried About Charity fundraiser in the Last Year: Never true  . Ran Out of Food in the Last Year: Never true  Transportation Needs: No Transportation Needs  . Lack of Transportation  (Medical): No  . Lack of Transportation (Non-Medical): No  Physical Activity: Inactive  . Days of Exercise per Week: 0 days  . Minutes of Exercise per Session: 0 min  Stress: Not on file  Social Connections: Socially Isolated  . Frequency of Communication with Friends and Family: Once a week  . Frequency of Social Gatherings with Friends and Family: Once a week  . Attends Religious Services: Never  . Active Member of Clubs or Organizations: No  . Attends Archivist Meetings: Never  . Marital Status: Never married    Allergies:  Allergies  Allergen Reactions  . Citric Acid Other (See Comments)    Runny nose, eyes water  . Food     Oranges or anything with citric acid  . Gabapentin     Walking into things, hard to maintain balance, falls    Metabolic Disorder Labs: Lab Results  Component Value Date   HGBA1C 7.2 (H) 09/22/2020   MPG 123 03/02/2018   No results found for: PROLACTIN Lab Results  Component Value Date   CHOL 141 09/22/2020   TRIG 124.0 09/22/2020   HDL 56.30 09/22/2020   CHOLHDL 3 09/22/2020   VLDL 24.8 09/22/2020   LDLCALC 60 09/22/2020   LDLCALC 41 04/06/2020   Lab Results  Component Value Date   TSH 1.02 07/08/2019   TSH 0.92 03/15/2018    Therapeutic Level Labs: No results found for: LITHIUM No results found for: VALPROATE No components found for:  CBMZ  Current Medications: Current Outpatient Medications  Medication Sig Dispense Refill  . ACCU-CHEK SOFTCLIX LANCETS lancets 3 (three) times daily. for testing  0  . albuterol (VENTOLIN HFA) 108 (90 Base) MCG/ACT inhaler INHALE 2 PUFFS BY MOUTH INTO THE LUNGS AS NEEDED 18 g 5  . amantadine (SYMMETREL) 100 MG capsule TAKE 1 CAPSULE(100 MG) BY MOUTH TWICE DAILY 180 capsule 0  . [START ON 12/27/2020] Cariprazine HCl (VRAYLAR) 6 MG CAPS TAKE 1 CAPSULE(6 MG) BY MOUTH DAILY 30 capsule 5  . clonazePAM (KLONOPIN) 1 MG tablet Take 1 tablet (1 mg total) by mouth 3 (three) times daily as needed for  anxiety. for anxiety 90 tablet 2  . clotrimazole-betamethasone (LOTRISONE) cream Apply 1 application topically 2 (two) times daily. 45 g 0  . fluPHENAZine (PROLIXIN) 5 MG tablet TAKE 1 TABLET(5 MG) BY MOUTH AT BEDTIME 90 tablet 0  . fluticasone (FLONASE) 50 MCG/ACT nasal spray SHAKE LIQUID AND USE 2 SPRAYS IN EACH NOSTRIL DAILY 16 g 6  . glipiZIDE (GLUCOTROL) 10 MG tablet TAKE 1 TABLET(10 MG) BY MOUTH TWICE DAILY 180 tablet 3  . glucose blood (ACCU-CHEK AVIVA PLUS) test strip 1 each by Other route daily. TEST DAILY 100 strip 0  . JARDIANCE 25 MG TABS tablet TAKE 1 TABLET BY MOUTH DAILY 90 tablet 2  . lamoTRIgine (LAMICTAL) 100 MG tablet Take 1 tablet by mouth twice daily 60 tablet 5  . LUMIGAN 0.01 % SOLN INSTILL 1 DROP INTO AFFECTED EYE ONCE AT BEDTIME    .  meloxicam (MOBIC) 15 MG tablet Take 1 tablet (15 mg total) by mouth daily. 30 tablet 3  . metFORMIN (GLUCOPHAGE-XR) 500 MG 24 hr tablet TAKE 2 TABLETS(1000 MG) BY MOUTH TWICE DAILY 360 tablet 1  . montelukast (SINGULAIR) 10 MG tablet TAKE 1 TABLET(10 MG) BY MOUTH AT BEDTIME 90 tablet 3  . RESTASIS 0.05 % ophthalmic emulsion     . sertraline (ZOLOFT) 100 MG tablet Take 1 tablet (100 mg total) by mouth daily. 90 tablet 1  . simvastatin (ZOCOR) 20 MG tablet TAKE 1 TABLET(20 MG) BY MOUTH EVERY EVENING 90 tablet 3  . tadalafil (CIALIS) 20 MG tablet TAKE ONE-HALF TO ONE TABLET BY MOUTH EVERY OTHER DAY AS NEEDED FOR ERECTILE DYSFUNCTION 10 tablet 4  . triamcinolone (KENALOG) 0.1 % Apply 1 application topically 2 (two) times daily. 30 g 0  . TYRVAYA 0.03 MG/ACT SOLN Spray 1 spray in each nostril twice daily, approximately 12 hours apart     No current facility-administered medications for this visit.       Psychiatric Specialty Exam: Review of Systems  Psychiatric/Behavioral: The patient is nervous/anxious.   All other systems reviewed and are negative.   There were no vitals taken for this visit.There is no height or weight on file to  calculate BMI.  General Appearance: NA  Eye Contact:  NA  Speech:  Clear and Coherent and Normal Rate  Volume:  Normal  Mood:  Anxious  Affect:  NA  Thought Process:  Goal Directed  Orientation:  Full (Time, Place, and Person)  Thought Content: Logical   Suicidal Thoughts:  No  Homicidal Thoughts:  No  Memory:  Immediate;   Good Recent;   Good Remote;   Good  Judgement:  Good  Insight:  Fair  Psychomotor Activity:  NA  Concentration:  Concentration: Good  Recall:  Good  Fund of Knowledge: Good  Language: Good  Akathisia:  Negative  Handed:  Right  AIMS (if indicated): not done  Assets:  Communication Skills Desire for Improvement Financial Resources/Insurance Housing  ADL's:  Intact  Cognition: WNL  Sleep:  Fair   Screenings: PHQ2-9   [facility] Office Visit from 04/06/2020 in [facility] from 02/26/2020 in [facility] Visit from 10/08/2019 in [facility] from [facility] Visit from 10/28/2016 in [facility]  PHQ-2 Total Score 1 3 2  0 0  PHQ-9 Total Score -- 11 -- -- --       Assessment and Plan: 55yo singleAAMwith schizophrenia paranoid type vsschizoaffective disorder. Patient has no hx of suicidal attempts but admits tohx ofon and off SI, he has a long hx of depression and excessive worrying as well as paranoid and grandiose delusions. He expressed havingsome bizarre perceptual disturbances e.g. that he suddenly stops hearing someone who he is talking to or the volume of conversation gradually decreases.He also reports brief, infrequent periods that he loses track of time. He has seen a neurologist for this in the past but "they did not find anything".We continuedclonazepam prn anxiety. Dc [name] (no benefit) and insteadaddedVraylarforparanoia.Heappearedless paranoid though still anxious  (takes clonazepambid ortiddepending on a day).We have added Prolixin 5 mg at bedtimewhen he became paranoid last December. Hetolerates well and reports decrease of paranoid fears.Denies having AH. He developed mild tremor a month or so ago - amantadine was added and tremor resolved. His sleepisfair.Hemoglobin A1C decreased to 6.9%, no changes in appetite.On  lamotrigine for polyneuropathic pain.  HK:[id number] schizophrenia  Plan: Continue Zoloft 100 mg,Vraylar6mg ,Klonopin 1 mgtid,amantadine 100 mg bidand fluphenazine5 mg at HS.Next appointment inapproximately1 month.The plan was discussed with patient who had an opportunity to ask questions and these were all answered.I spend37min in phone consultation with the patient.    Stephanie Acre, MD 11/24/2020, 8:43 AM

## 2020-11-25 ENCOUNTER — Other Ambulatory Visit: Payer: Self-pay

## 2020-11-25 ENCOUNTER — Ambulatory Visit: Payer: Medicare Other | Admitting: Physical Therapy

## 2020-11-25 ENCOUNTER — Encounter: Payer: Self-pay | Admitting: Physical Therapy

## 2020-11-25 ENCOUNTER — Telehealth: Payer: Self-pay | Admitting: Family Medicine

## 2020-11-25 DIAGNOSIS — M6281 Muscle weakness (generalized): Secondary | ICD-10-CM

## 2020-11-25 DIAGNOSIS — R293 Abnormal posture: Secondary | ICD-10-CM | POA: Diagnosis not present

## 2020-11-25 DIAGNOSIS — R279 Unspecified lack of coordination: Secondary | ICD-10-CM

## 2020-11-25 NOTE — Patient Instructions (Signed)
Access Code: [email or web address] URL: [email or web address] Date: 11/25/2020 Prepared by: Jari Favre  Exercises Diaphragmatic Breathing in Child's Pose with Pelvic Floor Relaxation - 1 x daily - 7 x weekly - 1 sets - 5 reps - 30 sec hold Hooklying Hamstring Stretch with Strap - 1 x daily - 7 x weekly - 1 sets - 3 reps - 30 sec hold Supine Piriformis Stretch - 1 x daily - 7 x weekly - 1 sets - 3 reps - 30 sec hold Cat-Camel to Child's Pose - 1 x daily - 7 x weekly - 1 sets - 5 reps - 10 sec hold Hooklying Transversus Abdominis Palpation - 1 x daily - 7 x weekly - 3 sets - 10 reps Supine March on Foam Roll - 1 x daily - 7 x weekly - 3 sets - 10 reps Supine Chest Stretch on Foam Roll - 1 x daily - 7 x weekly - 3 sets - 10 reps Standing with Back Flat Against Wall - 1 x daily - 7 x weekly - 1 sets - 10 reps - 5 sec hold Supine Pelvic Floor Stretch - Hands on Knees - 1 x daily - 7 x weekly - 1 sets - 3 reps - 30 hold

## 2020-11-25 NOTE — Therapy (Signed)
[facility] [address], [place], [name], [address] Phone: [phone]   Fax:  [phone]  Physical Therapy Treatment  Patient Details  Name: Glenn Robertson MRN: [id number] Date of Birth: [birth year] Referring Provider (PT): Lucas Mallow, MD   Encounter Date: 11/25/2020   PT End of Session - 11/25/20 1148    Visit Number 5    Date for PT Re-Evaluation 01/07/21    Authorization Type medicare/medicaid    PT Start Time 1125    PT Stop Time 1205    PT Time Calculation (min) 40 min    Activity Tolerance Patient tolerated treatment well    Behavior During Therapy [facility] for tasks assessed/performed           Past Medical History:  Diagnosis Date  . Allergy    dog and cats, citrus  . Anxiety   . Asthma   . Chronic pain   . Depression   . Diabetes mellitus   . Glaucoma   . Neuromuscular disorder ([place])   . Paranoid schizophrenia [facility])     Past Surgical History:  Procedure Laterality Date  . ANKLE ARTHROSCOPY    . Arm Surgery  1/12   rt ulnar nerve decompression  . EPIDURAL BLOCK INJECTION     several  . ULNAR NERVE TRANSPOSITION  01/19/2012   Procedure: ULNAR NERVE DECOMPRESSION/TRANSPOSITION;  Surgeon: Cammie Sickle., MD;  Location: [facility];  Service: Orthopedics;  Laterality: Left;  ulnar nerve decompression vs transposition left elbow    There were no vitals filed for this visit.   Subjective Assessment - 11/25/20 1125    Subjective Pt states he reinjured his tailbone when he went to sit and missed the chair.  Denies pain unless sitting directly on tailbone.    Patient Stated Goals be able to empty bladder in less time    Currently in Pain? No/denies                             [facility] Adult PT Treatment/Exercise - 11/25/20 0001      Self-Care   Other Self-Care Comments  urge techniques and lean with UE support on wall for improved trunk relaxation when  voiding      Neuro Re-ed    Neuro Re-ed Details  exhale with exertion      Lumbar Exercises: Stretches   Double Knee to Chest Stretch 3 reps;20 seconds      Lumbar Exercises: Standing   Other Standing Lumbar Exercises pelvic tilt against wall      Lumbar Exercises: Seated   Long Arc Quad on [place] Strengthening;Both;10 reps    Hip Flexion on [place] Strengthening;Both;10 reps      Lumbar Exercises: Supine   Bent Knee Raise Limitations alt LE with knee hug and core on exhale      Lumbar Exercises: Quadruped   [place] 5 reps                  PT Education - 11/25/20 1218    Education Details Access Code: [email or web address]    Person(s) Educated Patient    Methods Explanation;Demonstration;Tactile cues;Verbal cues;Handout    Comprehension Verbalized understanding;Returned demonstration               PT Long Term Goals - 11/25/20 1129      PT LONG TERM GOAL #1   Title Pt will be able start the  stream in 2 seconds or less    Baseline depends on how long I wait to go, usually it takes 2 seconds but sometimes it takes 10 seconds      PT LONG TERM GOAL #2   Title Be able to sleep through the night without nocturia    Baseline only wakes when sleeping on the couch, but in bed does not wake up to void    Status Achieved      PT LONG TERM GOAL #3   Title Pt will be able to jog 1-2 miles without increased bladder symptoms    Baseline denies pain or leakage when jogging but will JIC void several before running, no bladder symptoms lately    Status Achieved      PT LONG TERM GOAL #4   Title ...      PT LONG TERM GOAL #5   Title ...    Baseline .Marland Kitchen                 Plan - 11/25/20 1155    Clinical Impression Statement Pt is making progress with goals.  Today he reports less leakage overall.  Most of time when he has difficulty emptying the bladder it is because of voiding too frequentlying and not a lot in his bladder. Pt was educated on urge to void techniques to  relax bladderurges.  Pt continues to make progress with core strength and progressed to pelvic tilts on the wall.  He is getting better at not bulging and drawing belly button towards his spine. Pt needs a lot of cues with breathing and pelvic tilts initially but able to perform correctly at the end of the session today and updates made as seen in HEP.  Continue working on functional goals.    PT Treatment/Interventions ADLs/Self Care Home Management;Biofeedback;Cryotherapy;Electrical Stimulation;Moist Heat;Therapeutic activities;Therapeutic exercise;Neuromuscular re-education;Manual techniques;Patient/family education;Dry needling;Passive range of motion;Taping    PT Next Visit Plan bridge on foam roll with exhale on exertion and posterior pelvic tilt, review urge and give handout    PT Home Exercise Plan Access Code: [email or web address]    Consulted and Agree with Plan of Care Patient           Patient will benefit from skilled therapeutic intervention in order to improve the following deficits and impairments:  Postural dysfunction,Decreased strength,Impaired flexibility,Pain,Increased muscle spasms,Increased fascial restricitons,Decreased range of motion,Decreased coordination  Visit Diagnosis: Muscle weakness (generalized)  Unspecified lack of coordination  Abnormal posture     Problem List Patient Active Problem List   Diagnosis Date Noted  . Tremor due to multiple drugs 10/02/2020  . Seborrheic dermatitis 10/02/2020  . Polyneuropathy 05/12/2020  . Elevated cholesterol 10/08/2019  . Benign prostatic hyperplasia with nocturia 10/08/2019  . Schizoaffective disorder, depressive type ([place]) 11/27/2018  . Urinary hesitancy 10/08/2018  . Erectile dysfunction 09/24/2018  . Arm numbness 08/20/2018  . Transient alteration of awareness 08/08/2018  . Tendinopathy of right gluteus medius 05/22/2018  . Pain of right hip joint 05/08/2018  . Hearing difficulty 04/13/2018  . Trochanteric bursitis,  right hip 03/23/2018  . Asthma 03/16/2018  . Headache 03/02/2018  . Type 2 diabetes mellitus without complication, without long-term current use of insulin ([place]) 02/16/2018  . Healthcare maintenance 02/16/2018  . Pain in left leg 10/28/2016  . Lumbosacral radiculopathy at S1 03/15/2016  . Lumbar degenerative disc disease 12/18/2015  . Disc displacement, lumbar 04/21/2015  . Chondromalacia of both patellae 02/24/2015  . Paranoid schizophrenia, chronic condition ([place]) 02/24/2015  .  Right anterior knee pain 07/18/2014  . Right ankle pain 01/27/2014  . Pain in joint, ankle and foot 01/27/2014  . Sciatica 01/10/2012  . Disturbance of skin sensation 11/14/2011    Jule Ser, PT 11/25/2020, 12:24 PM  [facility] [address], [place], [name], [address] Phone: [phone]   Fax:  [phone]  Name: Anais Koenen MRN: [id number] Date of Birth: December 06, 1965

## 2020-11-25 NOTE — Telephone Encounter (Signed)
Patient is calling to speak to the provider or nurse. He wants to know when he should use his inhaler (before of after exercise) Please call him at [phone] and advise.

## 2020-11-29 NOTE — Telephone Encounter (Signed)
May use before exercise.

## 2020-11-30 ENCOUNTER — Ambulatory Visit: Payer: Medicare Other | Attending: Urology | Admitting: Physical Therapy

## 2020-11-30 ENCOUNTER — Other Ambulatory Visit: Payer: Self-pay

## 2020-11-30 DIAGNOSIS — R279 Unspecified lack of coordination: Secondary | ICD-10-CM | POA: Insufficient documentation

## 2020-11-30 DIAGNOSIS — M6281 Muscle weakness (generalized): Secondary | ICD-10-CM | POA: Diagnosis not present

## 2020-11-30 DIAGNOSIS — R293 Abnormal posture: Secondary | ICD-10-CM

## 2020-11-30 NOTE — Therapy (Signed)
[facility] [address] [place], [name], [address] Phone: [phone]   Fax:  [phone]  Physical Therapy Treatment  Patient Details  Name: Glenn Robertson MRN: [id number] Date of Birth: [birth year] Referring Provider (PT): Lucas Mallow, MD   Encounter Date: 11/30/2020   PT End of Session - 11/30/20 1104    Visit Number 6    Date for PT Re-Evaluation 01/07/21    Authorization Type medicare/medicaid    PT Start Time 1100    PT Stop Time 1143    PT Time Calculation (min) 43 min    Activity Tolerance Patient tolerated treatment well    Behavior During Therapy [facility] for tasks assessed/performed           Past Medical History:  Diagnosis Date  . Allergy    dog and cats, citrus  . Anxiety   . Asthma   . Chronic pain   . Depression   . Diabetes mellitus   . Glaucoma   . Neuromuscular disorder ([place])   . Paranoid schizophrenia [facility])     Past Surgical History:  Procedure Laterality Date  . ANKLE ARTHROSCOPY    . Arm Surgery  1/12   rt ulnar nerve decompression  . EPIDURAL BLOCK INJECTION     several  . ULNAR NERVE TRANSPOSITION  01/19/2012   Procedure: ULNAR NERVE DECOMPRESSION/TRANSPOSITION;  Surgeon: Cammie Sickle., MD;  Location: [place];  Service: Orthopedics;  Laterality: Left;  ulnar nerve decompression vs transposition left elbow    There were no vitals filed for this visit.   Subjective Assessment - 11/30/20 1104    Subjective Pt states low back and tailbone are sore when waking up in the morning and sometimes squatting down.  This is also when he feels low back pain    Patient Stated Goals be able to empty bladder in less time    Currently in Pain? No/denies                             [facility] Adult PT Treatment/Exercise - 11/30/20 0001      Lumbar Exercises: Stretches   Hip Flexor Stretch Right;Left;1 rep;30 seconds      Lumbar Exercises: Aerobic    Elliptical L1x 5 min PT present for status      Lumbar Exercises: Standing   Forward Lunge Limitations slider - 10x each side    Side Lunge Limitations side step with green loop; hip ext green loop - cues to keep trunk stable    Wall Slides Limitations ball behind back 20x      Lumbar Exercises: Supine   Bent Knee Raise 20 reps    Bent Knee Raise Limitations alt knee with UE to ceiling - no holding breath    Large Ball Abdominal Isometric 10 reps    Large Ball Abdominal Isometric Limitations UE overhead with large ball; LE roll out      Manual Therapy   Soft tissue mobilization bilat gluteals addaday and lubmar parapsinals manual TPR                       PT Long Term Goals - 11/30/20 1156      PT LONG TERM GOAL #1   Title Pt will be able start the stream in 2 seconds or less    Status On-going  Plan - 11/30/20 1139    Clinical Impression Statement Pt responded well to IASTM to gluteals.  Lumbar paraspinlas tight Rt>Lt.  Pt was noticeably fatigued with exercises today.  Pt able to do more challengeing exercises such as wall slide on pball and lunges.  He did well not holding his breath without needing cues for this today.  No increased pain during treatment.  Pt will benefit from skilled PT to restore maximum function and activity level.    PT Treatment/Interventions ADLs/Self Care Home Management;Biofeedback;Cryotherapy;Electrical Stimulation;Moist Heat;Therapeutic activities;Therapeutic exercise;Neuromuscular re-education;Manual techniques;Patient/family education;Dry needling;Passive range of motion;Taping    PT Next Visit Plan f/u bridge on foam roll with exhale on exertion and posterior pelvic tilt, review urge and give handout    PT Home Exercise Plan Access Code: [email or web address]    Consulted and Agree with Plan of Care Patient           Patient will benefit from skilled therapeutic intervention in order to improve the following deficits and  impairments:  Postural dysfunction,Decreased strength,Impaired flexibility,Pain,Increased muscle spasms,Increased fascial restricitons,Decreased range of motion,Decreased coordination  Visit Diagnosis: Muscle weakness (generalized)  Unspecified lack of coordination  Abnormal posture     Problem List Patient Active Problem List   Diagnosis Date Noted  . Tremor due to multiple drugs 10/02/2020  . Seborrheic dermatitis 10/02/2020  . Polyneuropathy 05/12/2020  . Elevated cholesterol 10/08/2019  . Benign prostatic hyperplasia with nocturia 10/08/2019  . Schizoaffective disorder, depressive type ([place]) 11/27/2018  . Urinary hesitancy 10/08/2018  . Erectile dysfunction 09/24/2018  . Arm numbness 08/20/2018  . Transient alteration of awareness 08/08/2018  . Tendinopathy of right gluteus medius 05/22/2018  . Pain of right hip joint 05/08/2018  . Hearing difficulty 04/13/2018  . Trochanteric bursitis, right hip 03/23/2018  . Asthma 03/16/2018  . Headache 03/02/2018  . Type 2 diabetes mellitus without complication, without long-term current use of insulin ([place]) 02/16/2018  . Healthcare maintenance 02/16/2018  . Pain in left leg 10/28/2016  . Lumbosacral radiculopathy at S1 03/15/2016  . Lumbar degenerative disc disease 12/18/2015  . Disc displacement, lumbar 04/21/2015  . Chondromalacia of both patellae 02/24/2015  . Paranoid schizophrenia, chronic condition ([place]) 02/24/2015  . Right anterior knee pain 07/18/2014  . Right ankle pain 01/27/2014  . Pain in joint, ankle and foot 01/27/2014  . Sciatica 01/10/2012  . Disturbance of skin sensation 11/14/2011    Jule Ser, PT 11/30/2020, 12:14 PM  [facility] [address] [place], [name], [address] Phone: [phone]   Fax:  [phone]  Name: Glenn Robertson MRN: [id number] Date of Birth: 15-Jun-1966

## 2020-11-30 NOTE — Telephone Encounter (Signed)
Pt aware of Dr. Bebe Shaggy message back.

## 2020-12-07 ENCOUNTER — Encounter: Payer: Self-pay | Admitting: Physical Therapy

## 2020-12-07 ENCOUNTER — Other Ambulatory Visit: Payer: Self-pay

## 2020-12-07 ENCOUNTER — Ambulatory Visit: Payer: Medicare Other | Admitting: Physical Therapy

## 2020-12-07 DIAGNOSIS — R279 Unspecified lack of coordination: Secondary | ICD-10-CM

## 2020-12-07 DIAGNOSIS — M6281 Muscle weakness (generalized): Secondary | ICD-10-CM | POA: Diagnosis not present

## 2020-12-07 DIAGNOSIS — R293 Abnormal posture: Secondary | ICD-10-CM

## 2020-12-07 NOTE — Therapy (Signed)
[facility] [address], [place], [name], [address] Phone: [phone]   Fax:  [phone]  Physical Therapy Treatment  Patient Details  Name: Glenn Robertson MRN: [id number] Date of Birth: 1966/03/07 Referring Provider (PT): Lucas Mallow, MD   Encounter Date: 12/07/2020   PT End of Session - 12/07/20 1018    Visit Number 7    Date for PT Re-Evaluation 01/07/21    Authorization Type medicare/medicaid    PT Start Time 1015    PT Stop Time 1055    PT Time Calculation (min) 40 min    Activity Tolerance Patient tolerated treatment well    Behavior During Therapy [facility] for tasks assessed/performed           Past Medical History:  Diagnosis Date  . Allergy    dog and cats, citrus  . Anxiety   . Asthma   . Chronic pain   . Depression   . Diabetes mellitus   . Glaucoma   . Neuromuscular disorder ([place])   . Paranoid schizophrenia [facility])     Past Surgical History:  Procedure Laterality Date  . ANKLE ARTHROSCOPY    . Arm Surgery  1/12   rt ulnar nerve decompression  . EPIDURAL BLOCK INJECTION     several  . ULNAR NERVE TRANSPOSITION  01/19/2012   Procedure: ULNAR NERVE DECOMPRESSION/TRANSPOSITION;  Surgeon: Cammie Sickle., MD;  Location: [place];  Service: Orthopedics;  Laterality: Left;  ulnar nerve decompression vs transposition left elbow    There were no vitals filed for this visit.   Subjective Assessment - 12/07/20 1142    Subjective pt states pain when waking today was bad, low back and tail bone,  Currently better    Currently in Pain? Yes    Pain Score 2     Pain Location Back    Pain Orientation Right;Left;Lower    Pain Descriptors / Indicators Aching    Pain Type Acute pain;Chronic pain    Aggravating Factors  waking up    Pain Relieving Factors moving around    Multiple Pain Sites No                             [facility] Adult PT Treatment/Exercise -  12/07/20 0001      Lumbar Exercises: Stretches   Active Hamstring Stretch 3 reps;20 seconds    Hip Flexor Stretch Right;Left;1 rep;30 seconds      Lumbar Exercises: Standing   Side Lunge Limitations side step with blue loop; hip ext green loop - cues to keep trunk stable    Other Standing Lumbar Exercises marching with blue      Lumbar Exercises: Supine   Other Supine Lumbar Exercises bridge on foam roll      Lumbar Exercises: Sidelying   Clam Right;Left;20 reps   green loops   Other Sidelying Lumbar Exercises hip abduction 3 ways 10x each way both side      Lumbar Exercises: Quadruped   Other Quadruped Lumbar Exercises fire hydrant green loop 3 x10                       PT Long Term Goals - 12/07/20 1025      PT LONG TERM GOAL #1   Title Pt will be able start the stream in 2 seconds or less    Baseline it empties okay now  Status Achieved      PT LONG TERM GOAL #2   Title Be able to sleep through the night without nocturia    Status Achieved      PT LONG TERM GOAL #3   Title Pt will be able to jog 1-2 miles without increased bladder symptoms    Baseline denies pain or leakage when jogging and only voids 1x before running, no bladder symptoms lately    Status Achieved      PT LONG TERM GOAL #4   Title Pt will not have any leakage down from having leakage 2x/day after voiding    Baseline currently 1-2x week    Status New      PT LONG TERM GOAL #5   Title Pt will report back pain reduced by at least 25% in the mornings    Baseline has been 9/10 in the morning    Time 1    Period Weeks    Status New                 Plan - 12/07/20 1044    Clinical Impression Statement Pt continues to make progress and reports he did not have much leakage. He was having leakage 2x/day and now down to 1-2/week .  Pt able to progress bridges onto the foam.  Today's session focusing a lot of gluteal strength and cues to keep pelvis in netural during standing  exercises.  Pt will benefit from skilled PT to ensure he reaches his functional goals and is able to return to maximum functional without pain.    PT Treatment/Interventions ADLs/Self Care Home Management;Biofeedback;Cryotherapy;Electrical Stimulation;Moist Heat;Therapeutic activities;Therapeutic exercise;Neuromuscular re-education;Manual techniques;Patient/family education;Dry needling;Passive range of motion;Taping    PT Next Visit Plan f/u bridge on foam roll with exhale on exertion and posterior pelvic tilt, review urge and give handout    PT Home Exercise Plan Access Code: [email or web address]    Consulted and Agree with Plan of Care Patient           Patient will benefit from skilled therapeutic intervention in order to improve the following deficits and impairments:  Postural dysfunction,Decreased strength,Impaired flexibility,Pain,Increased muscle spasms,Increased fascial restricitons,Decreased range of motion,Decreased coordination  Visit Diagnosis: Muscle weakness (generalized)  Unspecified lack of coordination  Abnormal posture     Problem List Patient Active Problem List   Diagnosis Date Noted  . Tremor due to multiple drugs 10/02/2020  . Seborrheic dermatitis 10/02/2020  . Polyneuropathy 05/12/2020  . Elevated cholesterol 10/08/2019  . Benign prostatic hyperplasia with nocturia 10/08/2019  . Schizoaffective disorder, depressive type ([place]) 11/27/2018  . Urinary hesitancy 10/08/2018  . Erectile dysfunction 09/24/2018  . Arm numbness 08/20/2018  . Transient alteration of awareness 08/08/2018  . Tendinopathy of right gluteus medius 05/22/2018  . Pain of right hip joint 05/08/2018  . Hearing difficulty 04/13/2018  . Trochanteric bursitis, right hip 03/23/2018  . Asthma 03/16/2018  . Headache 03/02/2018  . Type 2 diabetes mellitus without complication, without long-term current use of insulin ([place]) 02/16/2018  . Healthcare maintenance 02/16/2018  . Pain in left leg 10/28/2016   . Lumbosacral radiculopathy at S1 03/15/2016  . Lumbar degenerative disc disease 12/18/2015  . Disc displacement, lumbar 04/21/2015  . Chondromalacia of both patellae 02/24/2015  . Paranoid schizophrenia, chronic condition ([place]) 02/24/2015  . Right anterior knee pain 07/18/2014  . Right ankle pain 01/27/2014  . Pain in joint, ankle and foot 01/27/2014  . Sciatica 01/10/2012  . Disturbance of skin sensation 11/14/2011  Camillo Flaming Eziah Negro, PT 12/07/2020, 11:43 AM  [facility] [address], [place], [name], [address] Phone: [phone]   Fax:  [phone]  Name: Glenn Robertson MRN: [id number] Date of Birth: 23-Nov-1965

## 2020-12-09 ENCOUNTER — Ambulatory Visit: Payer: Medicare Other | Admitting: Surgery

## 2020-12-10 ENCOUNTER — Other Ambulatory Visit: Payer: Self-pay | Admitting: *Deleted

## 2020-12-10 ENCOUNTER — Encounter: Payer: Self-pay | Admitting: Surgery

## 2020-12-10 ENCOUNTER — Ambulatory Visit (INDEPENDENT_AMBULATORY_CARE_PROVIDER_SITE_OTHER): Payer: Medicare Other | Admitting: Surgery

## 2020-12-10 ENCOUNTER — Other Ambulatory Visit: Payer: Self-pay

## 2020-12-10 VITALS — BP 106/71 | HR 93 | Ht 71.0 in | Wt 203.6 lb

## 2020-12-10 DIAGNOSIS — M5136 Other intervertebral disc degeneration, lumbar region: Secondary | ICD-10-CM | POA: Diagnosis not present

## 2020-12-10 MED ORDER — LAMOTRIGINE 100 MG PO TABS: ORAL_TABLET | ORAL | 0 refills | Status: DC

## 2020-12-10 NOTE — Progress Notes (Signed)
55 year old black male returns for recheck of his low back pain.  States that he continues have ongoing low back pain.  States that pain radiates to the left buttock and hamstring area.    Exam Pleasant black male alert and oriented in no acute distress.  Negative log about his.  Mild discomfort with left straight leg raise.  Neurologically intact.  Plan As previously discussed last office visit I will go ahead and schedule lumbar MRI to rule out HNP/stenosis.  He will follow-up with Dr. Lorin Mercy in 3 weeks for recheck to discuss results and further treatment options.  All questions answered.

## 2020-12-14 ENCOUNTER — Other Ambulatory Visit: Payer: Self-pay

## 2020-12-14 ENCOUNTER — Ambulatory Visit (INDEPENDENT_AMBULATORY_CARE_PROVIDER_SITE_OTHER): Payer: Medicare Other | Admitting: Psychiatry

## 2020-12-14 DIAGNOSIS — R404 Transient alteration of awareness: Secondary | ICD-10-CM | POA: Diagnosis not present

## 2020-12-15 ENCOUNTER — Encounter (HOSPITAL_COMMUNITY): Payer: Self-pay | Admitting: Psychiatry

## 2020-12-15 NOTE — Progress Notes (Signed)
[facility] [facility] MD/PA/NP OP Progress Note  12/14/2020 Glenn Robertson  MRN:  [id number]   Chief Complaint:  Patient returns for medication management  HPI: 55yo male, who has been diagnosed with schizoaffective disorder. He was being followed by Dr . Montel Culver, who has left the clinic, and I am providing follow ups /bridging until new provider starts .  He reports he has been doing "OK". Denies significant depression , denies significant neuro-vegetative symptoms. Denies suicidal ideations. Reports chronic paranoid ideations. He reports he has experienced these ideations for many years . Reports that he has seen a vehicle driving around his house a few times and states " I think maybe it's someone from [place] " ( explains this is a town he used to live years ago) . He has some insight , and states " I don't know why they would be following me, it does not make sense " and is able to question if this is indeed happening . As above, reports that these ideations are chronic and apparently present for years in a waxing and waning manner .  He denies acting out on paranoid ideations other than admitting he is hypervigilant . Denies violent ideations . Denies hallucinations and does not present internally preoccupied . Denies medication side effects. No negative type symptoms are reported or apparent . He reports he is functioning well in daily activities and is for example actively learning languages and currently has a tutor helping him learn portuguese.     Visit Diagnosis:    ICD-10-CM   1. Transient alteration of awareness  R40.4     Past Psychiatric History: Please see intake H&P.  Past Medical History:  Past Medical History:  Diagnosis Date  . Allergy    dog and cats, citrus  . Anxiety   . Asthma   . Chronic pain   . Depression   . Diabetes mellitus   . Glaucoma   . Neuromuscular disorder ([place])   . Paranoid schizophrenia [facility])     Past Surgical History:  Procedure Laterality Date   . ANKLE ARTHROSCOPY    . Arm Surgery  1/12   rt ulnar nerve decompression  . EPIDURAL BLOCK INJECTION     several  . ULNAR NERVE TRANSPOSITION  01/19/2012   Procedure: ULNAR NERVE DECOMPRESSION/TRANSPOSITION;  Surgeon: Cammie Sickle., MD;  Location: [place];  Service: Orthopedics;  Laterality: Left;  ulnar nerve decompression vs transposition left elbow    Family Psychiatric History: None.  Family History:  Family History  Problem Relation Age of Onset  . Diabetes Father   . Diabetes Paternal Uncle   . Colon cancer Neg Hx   . Colon polyps Neg Hx   . Esophageal cancer Neg Hx   . Rectal cancer Neg Hx   . Stomach cancer Neg Hx     Social History:  Social History   Socioeconomic History  . Marital status: Single    Spouse name: Not on file  . Number of children: 0  . Years of education: BA  . Highest education level: Not on file  Occupational History  . Occupation: Unemlpoyed-disabled  Tobacco Use  . Smoking status: Never Smoker  . Smokeless tobacco: Never Used  Vaping Use  . Vaping Use: Never used  Substance and Sexual Activity  . Alcohol use: No  . Drug use: No  . Sexual activity: Not on file  Other Topics Concern  . Not on file  Social History Narrative   Lives with mother  Caffeine use: Coke very rare   Right handed    Social Determinants of Health   Financial Resource Strain: Low Risk   . Difficulty of Paying Living Expenses: Not hard at all  Food Insecurity: No Food Insecurity  . Worried About Charity fundraiser in the Last Year: Never true  . Ran Out of Food in the Last Year: Never true  Transportation Needs: No Transportation Needs  . Lack of Transportation (Medical): No  . Lack of Transportation (Non-Medical): No  Physical Activity: Inactive  . Days of Exercise per Week: 0 days  . Minutes of Exercise per Session: 0 min  Stress: Not on file  Social Connections: Socially Isolated  . Frequency of Communication with Friends  and Family: Once a week  . Frequency of Social Gatherings with Friends and Family: Once a week  . Attends Religious Services: Never  . Active Member of Clubs or Organizations: No  . Attends Archivist Meetings: Never  . Marital Status: Never married    Allergies:  Allergies  Allergen Reactions  . Citric Acid Other (See Comments)    Runny nose, eyes water  . Food     Oranges or anything with citric acid  . Gabapentin     Walking into things, hard to maintain balance, falls    Metabolic Disorder Labs: Lab Results  Component Value Date   HGBA1C 7.2 (H) 09/22/2020   MPG 123 03/02/2018   No results found for: PROLACTIN Lab Results  Component Value Date   CHOL 141 09/22/2020   TRIG 124.0 09/22/2020   HDL 56.30 09/22/2020   CHOLHDL 3 09/22/2020   VLDL 24.8 09/22/2020   LDLCALC 60 09/22/2020   LDLCALC 41 04/06/2020   Lab Results  Component Value Date   TSH 1.02 07/08/2019   TSH 0.92 03/15/2018    Therapeutic Level Labs: No results found for: LITHIUM No results found for: VALPROATE No components found for:  CBMZ  Current Medications: Current Outpatient Medications  Medication Sig Dispense Refill  . ACCU-CHEK SOFTCLIX LANCETS lancets 3 (three) times daily. for testing  0  . albuterol (VENTOLIN HFA) 108 (90 Base) MCG/ACT inhaler INHALE 2 PUFFS BY MOUTH INTO THE LUNGS AS NEEDED 18 g 5  . amantadine (SYMMETREL) 100 MG capsule TAKE 1 CAPSULE(100 MG) BY MOUTH TWICE DAILY 180 capsule 0  . [START ON 12/27/2020] Cariprazine HCl (VRAYLAR) 6 MG CAPS TAKE 1 CAPSULE(6 MG) BY MOUTH DAILY 30 capsule 5  . clonazePAM (KLONOPIN) 1 MG tablet Take 1 tablet (1 mg total) by mouth 3 (three) times daily as needed for anxiety. for anxiety 90 tablet 2  . clotrimazole-betamethasone (LOTRISONE) cream Apply 1 application topically 2 (two) times daily. 45 g 0  . fluPHENAZine (PROLIXIN) 5 MG tablet TAKE 1 TABLET(5 MG) BY MOUTH AT BEDTIME 90 tablet 0  . fluticasone (FLONASE) 50 MCG/ACT nasal  spray SHAKE LIQUID AND USE 2 SPRAYS IN EACH NOSTRIL DAILY 16 g 6  . glipiZIDE (GLUCOTROL) 10 MG tablet TAKE 1 TABLET(10 MG) BY MOUTH TWICE DAILY 180 tablet 3  . glucose blood (ACCU-CHEK AVIVA PLUS) test strip 1 each by Other route daily. TEST DAILY 100 strip 0  . JARDIANCE 25 MG TABS tablet TAKE 1 TABLET BY MOUTH DAILY 90 tablet 2  . lamoTRIgine (LAMICTAL) 100 MG tablet Take 1 tablet by mouth twice daily 180 tablet 0  . LUMIGAN 0.01 % SOLN INSTILL 1 DROP INTO AFFECTED EYE ONCE AT BEDTIME    . meloxicam (MOBIC) 15  MG tablet Take 1 tablet (15 mg total) by mouth daily. 30 tablet 3  . metFORMIN (GLUCOPHAGE-XR) 500 MG 24 hr tablet TAKE 2 TABLETS(1000 MG) BY MOUTH TWICE DAILY 360 tablet 1  . montelukast (SINGULAIR) 10 MG tablet TAKE 1 TABLET(10 MG) BY MOUTH AT BEDTIME 90 tablet 3  . RESTASIS 0.05 % ophthalmic emulsion     . sertraline (ZOLOFT) 100 MG tablet Take 1 tablet (100 mg total) by mouth daily. 90 tablet 1  . simvastatin (ZOCOR) 20 MG tablet TAKE 1 TABLET(20 MG) BY MOUTH EVERY EVENING 90 tablet 3  . tadalafil (CIALIS) 20 MG tablet TAKE ONE-HALF TO ONE TABLET BY MOUTH EVERY OTHER DAY AS NEEDED FOR ERECTILE DYSFUNCTION 10 tablet 4  . triamcinolone (KENALOG) 0.1 % Apply 1 application topically 2 (two) times daily. 30 g 0  . TYRVAYA 0.03 MG/ACT SOLN Spray 1 spray in each nostril twice daily, approximately 12 hours apart     No current facility-administered medications for this visit.       Psychiatric Specialty Exam: Review of Systems  Psychiatric/Behavioral: The patient is nervous/anxious.   All other systems reviewed and are negative. Does not endorse   There were no vitals taken for this visit.There is no height or weight on file to calculate BMI.  General Appearance: casual, well groomed   Eye Contact:  Good   Speech:  Normal   Volume:  Normal  Mood:  Reports mood "OK", denies depression  Affect:  Appropriate, vaguely guarded   Thought Process:  Goal Directed/ linear   Orientation:   Full (Time, Place, and Person)  Thought Content: paranoid ideations/self referential ideations as above, no hallucinations reported or noted.  Suicidal Thoughts:  No denies SI  Homicidal Thoughts:  No denies any violent or homicidal plan or intentions   Memory: recent and remote grossly intact   Judgement:  Good  Insight:  Fair  Psychomotor Activity:  No restlessness or agitation  Concentration:  Good   Recall:  Good  Fund of Knowledge: Good  Language: Good  Akathisia:  Negative  Handed:  Right  AIMS (if indicated): does not report abnormal or involuntary movements   Assets:  Communication Skills Desire for Improvement Financial Resources/Insurance Housing  ADL's:  Intact  Cognition: WNL  Sleep:  Fair   Screenings: PHQ2-9   [place] Visit from 04/06/2020 in [place] from 02/26/2020 in [place] from 10/08/2019 in [place] from 05/09/2018 [facility] Visit from 10/28/2016 in [facility]  PHQ-2 Total Score 1 3 2  0 0  PHQ-9 Total Score -- 11 -- -- --       Assessment and Plan: 55yo singleAAMwith schizophrenia paranoid type vsschizoaffective disorder.  Currently reports he is doing " all right" and does not endorse significant depression or any SI.  He reports a history of chronic paranoid ideations , and states he feels he may be being monitored /followed , stating he has seen a white vehicle driving around his house a few times . He has some insight/ability to question whether his interpretation of being followed is correct. No hallucinations.  No significant  negative symptoms are noted at this time. Denies medication side effects at this time, and feels they are helping .   EL:FYBOFBPZWCHENID Disorder   Plan:  Continue Zoloft 100 mg  QDAY for depression, anxiety Continue Vraylar6mg  for  paranoia, psychosis Continue Klonopin 1  mgTID PRN for anxiety Continue amantadine 100 mg BID to minimize tremors /EPS  Continue Fluphenazine5 mg QHS for paranoia , psychosis  Does no trequire medication reflls at this time. Side effects reviewed    Next appointment inapproximatelyin 6-8 weeks . Agrees to contact clinic sooner if any worsening prior or medication concerns      Jenne Campus, MD 12/15/2020, 9:22 AM

## 2020-12-16 ENCOUNTER — Telehealth: Payer: Self-pay | Admitting: Surgery

## 2020-12-16 NOTE — Telephone Encounter (Signed)
Please get his questions or he can send message and then forward this to me.  Thanks.

## 2020-12-16 NOTE — Telephone Encounter (Signed)
He wanted to ask if he should get MRI, states after 2nd fall his back still hurts

## 2020-12-16 NOTE — Telephone Encounter (Signed)
See below

## 2020-12-16 NOTE — Telephone Encounter (Signed)
Patient called requesting a call back from [place]. Patient did not disclose reasoning for call other than he has medical questions. Please call patient at [phone].

## 2020-12-17 ENCOUNTER — Ambulatory Visit: Payer: Medicare Other | Admitting: Physical Therapy

## 2020-12-17 ENCOUNTER — Other Ambulatory Visit: Payer: Self-pay

## 2020-12-17 ENCOUNTER — Other Ambulatory Visit: Payer: Self-pay | Admitting: Surgery

## 2020-12-17 DIAGNOSIS — M6281 Muscle weakness (generalized): Secondary | ICD-10-CM | POA: Diagnosis not present

## 2020-12-17 DIAGNOSIS — R279 Unspecified lack of coordination: Secondary | ICD-10-CM

## 2020-12-17 DIAGNOSIS — R293 Abnormal posture: Secondary | ICD-10-CM

## 2020-12-17 DIAGNOSIS — M5416 Radiculopathy, lumbar region: Secondary | ICD-10-CM

## 2020-12-17 DIAGNOSIS — M5136 Other intervertebral disc degeneration, lumbar region: Secondary | ICD-10-CM

## 2020-12-17 NOTE — Telephone Encounter (Signed)
It is ordered

## 2020-12-17 NOTE — Therapy (Signed)
[facility] [address] [place], [name], [address] Phone: [phone]   Fax:  [phone]  Physical Therapy Treatment  Patient Details  Name: Glenn Robertson MRN: [id number] Date of Birth: 1965/12/11 Referring Provider (PT): Lucas Mallow, MD   Encounter Date: 12/17/2020   PT End of Session - 12/17/20 1018    Visit Number 8    Date for PT Re-Evaluation 01/07/21    Authorization Type medicare/medicaid    PT Start Time 1016    PT Stop Time 1054    PT Time Calculation (min) 38 min    Activity Tolerance Patient tolerated treatment well    Behavior During Therapy [facility] for tasks assessed/performed           Past Medical History:  Diagnosis Date  . Allergy    dog and cats, citrus  . Anxiety   . Asthma   . Chronic pain   . Depression   . Diabetes mellitus   . Glaucoma   . Neuromuscular disorder ([place])   . Paranoid schizophrenia [facility])     Past Surgical History:  Procedure Laterality Date  . ANKLE ARTHROSCOPY    . Arm Surgery  1/12   rt ulnar nerve decompression  . EPIDURAL BLOCK INJECTION     several  . ULNAR NERVE TRANSPOSITION  01/19/2012   Procedure: ULNAR NERVE DECOMPRESSION/TRANSPOSITION;  Surgeon: Cammie Sickle., MD;  Location: [place];  Service: Orthopedics;  Laterality: Left;  ulnar nerve decompression vs transposition left elbow    There were no vitals filed for this visit.   Subjective Assessment - 12/17/20 1100    Subjective Pt states no tailbone pain and back pain was only 2/10 this morning.  It seems to be disapating. Pt states only small amount of leakage 1x after voiding.    Patient Stated Goals be able to empty bladder in less time    Currently in Pain? No/denies                             [facility] Adult PT Treatment/Exercise - 12/17/20 0001      Lumbar Exercises: Stretches   Hip Flexor Stretch Right;Left;1 rep;30 seconds    Hip Flexor Stretch  Limitations in half kneel    Quad Stretch Right;Left;3 reps;30 seconds      Lumbar Exercises: Standing   Other Standing Lumbar Exercises walking with sports cord 25 back and 20 side to side - 10x      Lumbar Exercises: Sidelying   Other Sidelying Lumbar Exercises hip abduction 3 ways 10x each way both side   add 1.5#                      PT Long Term Goals - 12/07/20 1025      PT LONG TERM GOAL #1   Title Pt will be able start the stream in 2 seconds or less    Baseline it empties okay now    Status Achieved      PT LONG TERM GOAL #2   Title Be able to sleep through the night without nocturia    Status Achieved      PT LONG TERM GOAL #3   Title Pt will be able to jog 1-2 miles without increased bladder symptoms    Baseline denies pain or leakage when jogging and only voids 1x before running, no bladder symptoms lately  Status Achieved      PT LONG TERM GOAL #4   Title Pt will not have any leakage down from having leakage 2x/day after voiding    Baseline currently 1-2x week    Status New      PT LONG TERM GOAL #5   Title Pt will report back pain reduced by at least 25% in the mornings    Baseline has been 9/10 in the morning    Time 1    Period Weeks    Status New                 Plan - 12/17/20 1051    Clinical Impression Statement Pt is still having some leakage of a drop after usingthe bathroom up to 2 x/week. Pt has reported improved low back pain only 2/10 down from 9/10 and states tailbone pain is under control  Pt states his exercises at home are going well.  He is expected to be able to discharge next visit to maintain improvements with final HEP.    PT Treatment/Interventions ADLs/Self Care Home Management;Biofeedback;Cryotherapy;Electrical Stimulation;Moist Heat;Therapeutic activities;Therapeutic exercise;Neuromuscular re-education;Manual techniques;Patient/family education;Dry needling;Passive range of motion;Taping    PT Next Visit Plan  final HEP, hip flexor stretches reviewed and review goals for d/c    PT Home Exercise Plan Access Code: [email or web address]    Consulted and Agree with Plan of Care Patient           Patient will benefit from skilled therapeutic intervention in order to improve the following deficits and impairments:  Postural dysfunction,Decreased strength,Impaired flexibility,Pain,Increased muscle spasms,Increased fascial restricitons,Decreased range of motion,Decreased coordination  Visit Diagnosis: Muscle weakness (generalized)  Unspecified lack of coordination  Abnormal posture     Problem List Patient Active Problem List   Diagnosis Date Noted  . Tremor due to multiple drugs 10/02/2020  . Seborrheic dermatitis 10/02/2020  . Polyneuropathy 05/12/2020  . Elevated cholesterol 10/08/2019  . Benign prostatic hyperplasia with nocturia 10/08/2019  . Schizoaffective disorder, depressive type ([place]) 11/27/2018  . Urinary hesitancy 10/08/2018  . Erectile dysfunction 09/24/2018  . Arm numbness 08/20/2018  . Transient alteration of awareness 08/08/2018  . Tendinopathy of right gluteus medius 05/22/2018  . Pain of right hip joint 05/08/2018  . Hearing difficulty 04/13/2018  . Trochanteric bursitis, right hip 03/23/2018  . Asthma 03/16/2018  . Headache 03/02/2018  . Type 2 diabetes mellitus without complication, without long-term current use of insulin ([place]) 02/16/2018  . Healthcare maintenance 02/16/2018  . Pain in left leg 10/28/2016  . Lumbosacral radiculopathy at S1 03/15/2016  . Lumbar degenerative disc disease 12/18/2015  . Disc displacement, lumbar 04/21/2015  . Chondromalacia of both patellae 02/24/2015  . Paranoid schizophrenia, chronic condition ([place]) 02/24/2015  . Right anterior knee pain 07/18/2014  . Right ankle pain 01/27/2014  . Pain in joint, ankle and foot 01/27/2014  . Sciatica 01/10/2012  . Disturbance of skin sensation 11/14/2011    Jule Ser, PT 12/17/2020, 12:14  PM  [facility] [address] [place], [name], [address] Phone: [phone]   Fax:  [phone]  Name: Ifeoluwa Beller MRN: [id number] Date of Birth: 1966-06-11

## 2020-12-19 DIAGNOSIS — Z20822 Contact with and (suspected) exposure to covid-19: Secondary | ICD-10-CM | POA: Diagnosis not present

## 2020-12-21 ENCOUNTER — Other Ambulatory Visit: Payer: Self-pay

## 2020-12-21 ENCOUNTER — Encounter: Payer: Self-pay | Admitting: Physical Therapy

## 2020-12-21 ENCOUNTER — Ambulatory Visit: Payer: Medicare Other | Admitting: Physical Therapy

## 2020-12-21 DIAGNOSIS — R293 Abnormal posture: Secondary | ICD-10-CM

## 2020-12-21 DIAGNOSIS — M6281 Muscle weakness (generalized): Secondary | ICD-10-CM

## 2020-12-21 DIAGNOSIS — R279 Unspecified lack of coordination: Secondary | ICD-10-CM

## 2020-12-21 NOTE — Therapy (Signed)
[facility] [address], [address] [place], [place], [address] Phone: [phone]   Fax:  [phone]  Physical Therapy Treatment  Patient Details  Name: Glenn Robertson MRN: [id number] Date of Birth: 08/19/1966 Referring Provider (PT): Bell, Eugene D III, MD   Encounter Date: 12/21/2020   PT End of Session - 12/21/20 1043    Visit Number 9    Date for PT Re-Evaluation 01/07/21    Authorization Type medicare/medicaid    PT Start Time 1016    PT Stop Time 1056    PT Time Calculation (min) 40 min    Activity Tolerance Patient tolerated treatment well    Behavior During Therapy WFL for tasks assessed/performed           Past Medical History:  Diagnosis Date  . Allergy    dog and cats, citrus  . Anxiety   . Asthma   . Chronic pain   . Depression   . Diabetes mellitus   . Glaucoma   . Neuromuscular disorder (HCC)   . Paranoid schizophrenia (HCC)     Past Surgical History:  Procedure Laterality Date  . ANKLE ARTHROSCOPY    . Arm Surgery  1/12   rt ulnar nerve decompression  . EPIDURAL BLOCK INJECTION     several  . ULNAR NERVE TRANSPOSITION  01/19/2012   Procedure: ULNAR NERVE DECOMPRESSION/TRANSPOSITION;  Surgeon: Robert V Sypher Jr., MD;  Location: [facility];  Service: Orthopedics;  Laterality: Left;  ulnar nerve decompression vs transposition left elbow    There were no vitals filed for this visit.   Subjective Assessment - 12/21/20 1019    Subjective Pt fell on Saturday and scraped his Lt knee and hand.  States he just rested the rest ofthe weekend after that    Patient Stated Goals be able to empty bladder in less time    Currently in Pain? No/denies                             [facility] Adult PT Treatment/Exercise - 12/21/20 0001      Self-Care   Other Self-Care Comments  self massage with foam noodle on perineum in sitting; reviewed all exercises in HEP as needed with  printout given      Lumbar Exercises: Stretches   Other Lumbar Stretch Exercise hamstring and hip flexors      Lumbar Exercises: Standing   Other Standing Lumbar Exercises side step band; hip ext band - 20x      Lumbar Exercises: Supine   Other Supine Lumbar Exercises bent knee fall out; UE flex on foam roll      Lumbar Exercises: Quadruped   [place] 5 reps    Other Quadruped Lumbar Exercises child pose                       PT Long Term Goals - 12/21/20 1034      PT LONG TERM GOAL #1   Title Pt will be able start the stream in 2 seconds or less    Baseline it empties okay now    Status Achieved      PT LONG TERM GOAL #2   Title Be able to sleep through the night without nocturia    Baseline only wakes when sleeping on the couch, but in bed does not wake up to void    Status Achieved        PT LONG TERM GOAL #3   Title Pt will be able to jog 1-2 miles without increased bladder symptoms    Baseline denies pain or leakage when jogging and only voids 1x before running, no bladder symptoms lately    Status Achieved      PT LONG TERM GOAL #4   Title Pt will not have any leakage down from having leakage 2x/day after voiding    Baseline haven't had for a while    Status Achieved      PT LONG TERM GOAL #5   Title Pt will report back pain reduced by at least 25% in the mornings    Baseline overall not sure    Status Not Met                 Plan - 12/21/20 1029    Clinical Impression Statement Pt is independent with final HEP.  He has met goals as stated and is recommended to d/c from PT today.    PT Treatment/Interventions ADLs/Self Care Home Management;Biofeedback;Cryotherapy;Electrical Stimulation;Moist Heat;Therapeutic activities;Therapeutic exercise;Neuromuscular re-education;Manual techniques;Patient/family education;Dry needling;Passive range of motion;Taping    PT Next Visit Plan d/c today    PT Home Exercise Plan Access Code: [email or web address]     Consulted and Agree with Plan of Care Patient           Patient will benefit from skilled therapeutic intervention in order to improve the following deficits and impairments:  Postural dysfunction,Decreased strength,Impaired flexibility,Pain,Increased muscle spasms,Increased fascial restricitons,Decreased range of motion,Decreased coordination  Visit Diagnosis: Muscle weakness (generalized)  Unspecified lack of coordination  Abnormal posture     Problem List Patient Active Problem List   Diagnosis Date Noted  . Tremor due to multiple drugs 10/02/2020  . Seborrheic dermatitis 10/02/2020  . Polyneuropathy 05/12/2020  . Elevated cholesterol 10/08/2019  . Benign prostatic hyperplasia with nocturia 10/08/2019  . Schizoaffective disorder, depressive type (HCC) 11/27/2018  . Urinary hesitancy 10/08/2018  . Erectile dysfunction 09/24/2018  . Arm numbness 08/20/2018  . Transient alteration of awareness 08/08/2018  . Tendinopathy of right gluteus medius 05/22/2018  . Pain of right hip joint 05/08/2018  . Hearing difficulty 04/13/2018  . Trochanteric bursitis, right hip 03/23/2018  . Asthma 03/16/2018  . Headache 03/02/2018  . Type 2 diabetes mellitus without complication, without long-term current use of insulin (HCC) 02/16/2018  . Healthcare maintenance 02/16/2018  . Pain in left leg 10/28/2016  . Lumbosacral radiculopathy at S1 03/15/2016  . Lumbar degenerative disc disease 12/18/2015  . Disc displacement, lumbar 04/21/2015  . Chondromalacia of both patellae 02/24/2015  . Paranoid schizophrenia, chronic condition (HCC) 02/24/2015  . Right anterior knee pain 07/18/2014  . Right ankle pain 01/27/2014  . Pain in joint, ankle and foot 01/27/2014  . Sciatica 01/10/2012  . Disturbance of skin sensation 11/14/2011    Jakki L , PT 12/21/2020, 11:04 AM  [facility] [address], [address] [place], [place],  [address] Phone: [phone]   Fax:  [phone]  Name: Taishaun Winward MRN: [id number] Date of Birth: 12/03/1965  PHYSICAL THERAPY DISCHARGE SUMMARY  Visits from Start of Care: 9  Current functional level related to goals / functional outcomes: See goals met   Remaining deficits: See above   Education / Equipment: HEP  Plan: Patient agrees to discharge.  Patient goals were met. Patient is being discharged due to being pleased with the current functional level.  ?????    Jakki , PT 12/21/20 11:06 AM   

## 2020-12-22 ENCOUNTER — Ambulatory Visit (INDEPENDENT_AMBULATORY_CARE_PROVIDER_SITE_OTHER): Payer: Medicare Other | Admitting: Psychology

## 2020-12-22 DIAGNOSIS — F4310 Post-traumatic stress disorder, unspecified: Secondary | ICD-10-CM

## 2020-12-22 DIAGNOSIS — F331 Major depressive disorder, recurrent, moderate: Secondary | ICD-10-CM

## 2020-12-22 DIAGNOSIS — F411 Generalized anxiety disorder: Secondary | ICD-10-CM | POA: Diagnosis not present

## 2020-12-22 DIAGNOSIS — F200 Paranoid schizophrenia: Secondary | ICD-10-CM

## 2020-12-23 ENCOUNTER — Other Ambulatory Visit: Payer: Self-pay

## 2020-12-24 DIAGNOSIS — H401232 Low-tension glaucoma, bilateral, moderate stage: Secondary | ICD-10-CM | POA: Diagnosis not present

## 2020-12-24 DIAGNOSIS — H04123 Dry eye syndrome of bilateral lacrimal glands: Secondary | ICD-10-CM | POA: Diagnosis not present

## 2020-12-24 DIAGNOSIS — H43813 Vitreous degeneration, bilateral: Secondary | ICD-10-CM | POA: Diagnosis not present

## 2020-12-25 ENCOUNTER — Other Ambulatory Visit: Payer: Self-pay

## 2020-12-25 ENCOUNTER — Encounter: Payer: Self-pay | Admitting: Family Medicine

## 2020-12-25 ENCOUNTER — Ambulatory Visit (INDEPENDENT_AMBULATORY_CARE_PROVIDER_SITE_OTHER): Payer: Medicare Other | Admitting: Family Medicine

## 2020-12-25 VITALS — BP 116/76 | HR 77 | Temp 97.3°F | Ht 71.0 in | Wt 198.2 lb

## 2020-12-25 DIAGNOSIS — S00412A Abrasion of left ear, initial encounter: Secondary | ICD-10-CM | POA: Diagnosis not present

## 2020-12-25 DIAGNOSIS — S80212A Abrasion, left knee, initial encounter: Secondary | ICD-10-CM | POA: Insufficient documentation

## 2020-12-25 MED ORDER — MUPIROCIN 2 % EX OINT: TOPICAL_OINTMENT | CUTANEOUS | 0 refills | Status: DC

## 2020-12-25 NOTE — Patient Instructions (Addendum)
Abrasion An abrasion is a cut or a scrape on your skin. You must take care of your wound so germs do not get in it and cause infection. What are the causes? This condition is caused by rubbing your skin on something or falling on a surface, such as the ground. When your skin rubs on something, some layers of skin may rub off. What are the signs or symptoms?  A cut or a scrape.  Bleeding.  A red or pink spot.  A bruise under your wound. How is this treated? This condition may be treated with:  Cleaning your wound.  Putting ointment on your wound.  Putting a bandage on your wound.  Getting a tetanus shot. Follow these instructions at home: Your doctor may tell you to do these things: Medicines  Take or use over-the-counter and prescription medicines only as told by your doctor.  If you were prescribed an antibiotic medicine, use it as told by your doctor. Do not stop using it even if you start to feel better. Keep your wound clean  Clean your wound 1 or 2 times a day or as often told by your doctor. To do this: 1. Wash your hands for at least 20 seconds with mild soap and water. Do this before and after you clean your wound. 2. Wash your wound with mild soap and water. 3. Rinse off the soap. 4. Pat your wound with a clean towel to dry it. Do not rub your wound.  Keep your bandage clean and dry. Take it off and change it as told by your doctor. ? You may have to change your bandage one or more times a day, or as told by your doctor. Watch for signs of infection Check your wound every day for signs of infection. Check for:  A red streak that goes away from your wound.  Other redness.  Swelling or more pain.  Warmth.  Blood, fluid, pus, or a bad smell. Treat pain and swelling  If told, put ice on the injured area. To do this: ? Put ice in a plastic bag. ? Place a towel between your skin and the bag. ? Leave the ice on for 20 minutes, 2-3 times a day. ? Take off  the ice if your skin turns bright red. This is very important. If you cannot feel pain, heat, or cold, you have a greater risk of damage to the area.  If you can, raise the injured area above the level of your heart while you are sitting or lying down.   General instructions  Do not take baths, swim, or use a hot tub. Ask your doctor about taking showers or sponge baths.  Keep all follow-up visits. Contact a doctor if:  You had a tetanus shot, and you have any of these where the needle went in: ? Swelling. ? Very bad pain. ? Redness. ? Bleeding.  You have a lot of pain, and medicine does not help.  You have a fever.  You have any of these signs of infection in your wound: ? Redness, swelling, or more pain. ? Blood, fluid, pus, or a bad smell. ? Warmth. Get help right away if:  You have a red streak going away from your wound. Summary  An abrasion is a cut or a scrape on your skin. Take care of your wound so germs do not get in it.  Clean your wound 1 or 2 times a day or as often as told. Change   your bandage as told and use medicines as told.  Call your doctor if you have a fever or if you have redness, swelling, or more pain in your wound.  Call your doctor if you have blood, fluid, pus, or a bad smell in your wound.  Get help right away if you have a red streak going away from your wound. This information is not intended to replace advice given to you by your health care provider. Make sure you discuss any questions you have with your health care provider. Document Revised: 11/14/2019 Document Reviewed: 11/14/2019 Elsevier Patient Education  2021 [place] Injuries, Adult Running is a great sport, whether you want to run to stay in shape, to have fun, or to compete in races. Running improves fitness and coordination. It can also improve your mood and energy level. Setting and achieving a running goal can give you a great sense of  accomplishment. Running is one of the safest sports, but injuries do happen. Many running injuries can be prevented, and there are several things you can do to lower your risk of injury. How can these injuries affect me? Running injuries usually affect the muscles, bones, and joints. Common running injuries include:  Soft tissue injuries to the attachments between bones (ligament sprains) and the attachments of muscles to bones (tendon strains). Sprains and strains from running usually occur in the feet, ankles, knees, and hips.  Overuse injuries. These happen after repeating certain movements too many times, often from overtraining. Without adequate rest, minor injuries do not have time to heal. Common overuse injuries from running include: ? Injuries in the knees and ankles. ? Inflammation of a tendon (tendinitis). ? Damage to the muscles and tendons near the shin bone (shin splints). ? Tiny cracks in the bones of the foot or lower leg (stress fractures). ? Conditions that cause pain in the bottom of the foot (plantar fasciitis), in the calf muscle, or behind the knee cap. When you run outside, you are also at risk for an injury related to the weather conditions. These include heat injuries, such as sunburn, dehydration, heat exhaustion, and heat stroke. Cold weather can cause hypothermia and frostbite. What actions can I take to prevent running injuries? Take safety measures before you begin Before you start running:  Check with your health care provider to make sure running is safe for you.  Have your feet checked to see if you need extra arch support or cushioning.  Think about whether you want to run for fun or train for a certain event. Different goals require different running plans.  Consider getting help from a coach or trainer to come up with a safe running plan and a training progression that fits your goals and ability. Use proper equipment Essential running gear  includes:  Running shoes that fit well and have good cushioning and arch support.  Moisture-wicking, cushioned socks to prevent blisters. Make sure you have running gear that is appropriate for the weather conditions.  In warm, sunny weather, wear: ? Lightweight, non-cotton, breathable clothing. ? A visor or breathable hat with a brim. ? Sunscreen that is at least 15 SPF. ? Sunglasses that block UVA and UVB rays.  In cold weather, dress in layers. Wear: ? Breathable clothing next to your skin. ? An insulated layer next for warmth. ? An outer layer that protects against moisture and wind. In windy weather, run against the wind going out and with the wind coming back. ? A  hat that covers your head, face, and ears. ? Gloves.  If you run at night, wear reflective clothing. Use proper training techniques  Warm up by jogging in place or doing some light exercise for about 5 minutes. After warming up, stretch for another 5 minutes.  Start running gradually, and work up to a comfortable speed.  Pay attention to your running form. ? Keep your head level and looking ahead. Do not tilt your chin up or down. ? Keep your shoulders down (not shrugged) and squeeze your shoulder blades together. ? Keep your spine straight and upright with just a slight lean forward. ? Watch your stride. To avoid landing on your heel, make sure that you do not step too far out in front.  Do not overtrain or increase miles too quickly.  To prevent dehydration: ? Drink about 10-15 ounces of water before you start running. ? While running, drink 10-15 ounces every 20 minutes.  After running, stretch again to cool down.   Follow basic safety rules  Let your body rest when needed. ? Do not run if you have pain. See your health care provider, and return to running only after he or she says it is safe to start again. ? Do not run if you are tired, sick, or recovering from illness.  Avoid running alone.  Let  someone know where you will be running, and carry a mobile phone.  Pay attention to your surroundings. Do not wear noise-canceling headphones or earbuds while running.  Take enough food and water on long runs.  Do not run outdoors in bad conditions such as: ? Extreme heat or cold. ? Heavy air pollution. ? High humidity. ? Lightning or other dangerous weather. Where to find more information  American Academy of Orthopa[employer] Surgeons: orthoinfo.org Contact a health care provider if you:  Have pain while running.  Have pain while resting or sleeping.  Are limping.  Have stiffness or swelling.  Feel weak, tired, or short of breath while running. Summary  Running is a great sport, whether you want to run to stay in shape, to have fun, or to compete in races.  Running injuries usually affect the muscles, bones, and joints.  Common running injuries include soft tissue injuries and overuse injuries. Other injuries can result from running in hot or cold weather.  Many running injuries can be prevented by preparing before you start, having proper gear, using good training techniques, and following basic safety rules.  Contact a health care provider if you have a running injury. Do not try to run through the pain. This information is not intended to replace advice given to you by your health care provider. Make sure you discuss any questions you have with your health care provider. Document Revised: 06/07/2019 Document Reviewed: 06/07/2019 Elsevier Patient Education  [place].

## 2020-12-25 NOTE — Progress Notes (Signed)
Established Patient Office Visit  Subjective:  Patient ID: Glenn Robertson, male    DOB: April 04, 1966  Age: 55 y.o. MRN: [id number]  CC:  Chief Complaint  Patient presents with  . Acute Visit    Concerns about scab on left ear, chafing in groin area, sore on knee.     HPI Glenn Robertson presents for evaluation of an abrasion he sustained to his left knee after he fell on one of his running routes.  This happened 6 days ago and seems to be healing well and he would like it checked.  He also has had a few lesions in his left ear that have been present over the last several months that do not appear to be healing.  They tend to bleed after he cleans them during self hygiene.  Past Medical History:  Diagnosis Date  . Allergy    dog and cats, citrus  . Anxiety   . Asthma   . Chronic pain   . Depression   . Diabetes mellitus   . Glaucoma   . Neuromuscular disorder ([place])   . Paranoid schizophrenia [facility])     Past Surgical History:  Procedure Laterality Date  . ANKLE ARTHROSCOPY    . Arm Surgery  1/12   rt ulnar nerve decompression  . EPIDURAL BLOCK INJECTION     several  . ULNAR NERVE TRANSPOSITION  01/19/2012   Procedure: ULNAR NERVE DECOMPRESSION/TRANSPOSITION;  Surgeon: Cammie Sickle., MD;  Location: [place];  Service: Orthopedics;  Laterality: Left;  ulnar nerve decompression vs transposition left elbow    Family History  Problem Relation Age of Onset  . Diabetes Father   . Diabetes Paternal Uncle   . Colon cancer Neg Hx   . Colon polyps Neg Hx   . Esophageal cancer Neg Hx   . Rectal cancer Neg Hx   . Stomach cancer Neg Hx     Social History   Socioeconomic History  . Marital status: Single    Spouse name: Not on file  . Number of children: 0  . Years of education: BA  . Highest education level: Not on file  Occupational History  . Occupation: Unemlpoyed-disabled  Tobacco Use  . Smoking status: Never Smoker  . Smokeless tobacco: Never Used   Vaping Use  . Vaping Use: Never used  Substance and Sexual Activity  . Alcohol use: No  . Drug use: No  . Sexual activity: Not on file  Other Topics Concern  . Not on file  Social History Narrative   Lives with mother   Caffeine use: Coke very rare   Right handed    Social Determinants of Health   Financial Resource Strain: Low Risk   . Difficulty of Paying Living Expenses: Not hard at all  Food Insecurity: No Food Insecurity  . Worried About Charity fundraiser in the Last Year: Never true  . Ran Out of Food in the Last Year: Never true  Transportation Needs: No Transportation Needs  . Lack of Transportation (Medical): No  . Lack of Transportation (Non-Medical): No  Physical Activity: Inactive  . Days of Exercise per Week: 0 days  . Minutes of Exercise per Session: 0 min  Stress: Not on file  Social Connections: Socially Isolated  . Frequency of Communication with Friends and Family: Once a week  . Frequency of Social Gatherings with Friends and Family: Once a week  . Attends Religious Services: Never  . Active Member of  Clubs or Organizations: No  . Attends Archivist Meetings: Never  . Marital Status: Never married  Intimate Partner Violence: Not on file    Outpatient Medications Prior to Visit  Medication Sig Dispense Refill  . ACCU-CHEK SOFTCLIX LANCETS lancets 3 (three) times daily. for testing  0  . albuterol (VENTOLIN HFA) 108 (90 Base) MCG/ACT inhaler INHALE 2 PUFFS BY MOUTH INTO THE LUNGS AS NEEDED 18 g 5  . amantadine (SYMMETREL) 100 MG capsule TAKE 1 CAPSULE(100 MG) BY MOUTH TWICE DAILY 180 capsule 0  . [START ON 12/27/2020] Cariprazine HCl (VRAYLAR) 6 MG CAPS TAKE 1 CAPSULE(6 MG) BY MOUTH DAILY 30 capsule 5  . clonazePAM (KLONOPIN) 1 MG tablet Take 1 tablet (1 mg total) by mouth 3 (three) times daily as needed for anxiety. for anxiety 90 tablet 2  . clotrimazole-betamethasone (LOTRISONE) cream Apply 1 application topically 2 (two) times daily. 45 g 0   . fluPHENAZine (PROLIXIN) 5 MG tablet TAKE 1 TABLET(5 MG) BY MOUTH AT BEDTIME 90 tablet 0  . fluticasone (FLONASE) 50 MCG/ACT nasal spray SHAKE LIQUID AND USE 2 SPRAYS IN EACH NOSTRIL DAILY 16 g 6  . glipiZIDE (GLUCOTROL) 10 MG tablet TAKE 1 TABLET(10 MG) BY MOUTH TWICE DAILY 180 tablet 3  . glucose blood (ACCU-CHEK AVIVA PLUS) test strip 1 each by Other route daily. TEST DAILY 100 strip 0  . JARDIANCE 25 MG TABS tablet TAKE 1 TABLET BY MOUTH DAILY 90 tablet 2  . lamoTRIgine (LAMICTAL) 100 MG tablet Take 1 tablet by mouth twice daily 180 tablet 0  . LUMIGAN 0.01 % SOLN INSTILL 1 DROP INTO AFFECTED EYE ONCE AT BEDTIME    . meloxicam (MOBIC) 15 MG tablet Take 1 tablet (15 mg total) by mouth daily. 30 tablet 3  . metFORMIN (GLUCOPHAGE-XR) 500 MG 24 hr tablet TAKE 2 TABLETS(1000 MG) BY MOUTH TWICE DAILY 360 tablet 1  . montelukast (SINGULAIR) 10 MG tablet TAKE 1 TABLET(10 MG) BY MOUTH AT BEDTIME 90 tablet 3  . sertraline (ZOLOFT) 100 MG tablet Take 1 tablet (100 mg total) by mouth daily. 90 tablet 1  . simvastatin (ZOCOR) 20 MG tablet TAKE 1 TABLET(20 MG) BY MOUTH EVERY EVENING 90 tablet 3  . tadalafil (CIALIS) 20 MG tablet TAKE ONE-HALF TO ONE TABLET BY MOUTH EVERY OTHER DAY AS NEEDED FOR ERECTILE DYSFUNCTION 10 tablet 4  . triamcinolone (KENALOG) 0.1 % Apply 1 application topically 2 (two) times daily. 30 g 0  . TYRVAYA 0.03 MG/ACT SOLN Spray 1 spray in each nostril twice daily, approximately 12 hours apart    . RESTASIS 0.05 % ophthalmic emulsion  (Patient not taking: Reported on 12/25/2020)     No facility-administered medications prior to visit.    Allergies  Allergen Reactions  . Citric Acid Other (See Comments)    Runny nose, eyes water  . Food     Oranges or anything with citric acid  . Gabapentin     Walking into things, hard to maintain balance, falls    ROS Review of Systems  Constitutional: Negative.   HENT: Negative for ear discharge, ear pain and hearing loss.    Respiratory: Negative.   Cardiovascular: Negative.   Gastrointestinal: Negative.   Skin: Positive for wound. Negative for color change.      Objective:    Physical Exam Vitals and nursing note reviewed.  Constitutional:      Appearance: Normal appearance.  HENT:     Head: Normocephalic and atraumatic.  Right Ear: Tympanic membrane and ear canal normal.     Left Ear: Tympanic membrane, ear canal and external ear normal.     Ears:   Eyes:     General: No scleral icterus.       Right eye: No discharge.        Left eye: No discharge.     Conjunctiva/sclera: Conjunctivae normal.  Pulmonary:     Effort: Pulmonary effort is normal.  Abdominal:     Hernia: There is no hernia in the left inguinal area or right inguinal area.  Genitourinary:    Penis: No hypospadias, erythema, tenderness, discharge, swelling or lesions.      Testes:        Right: Mass, tenderness or swelling not present. Right testis is descended.        Left: Mass, tenderness or swelling not present. Left testis is descended.     Epididymis:     Right: Not inflamed.     Left: Not inflamed.  Lymphadenopathy:     Lower Body: No right inguinal adenopathy. No left inguinal adenopathy.  Neurological:     Mental Status: He is alert.  Psychiatric:        Mood and Affect: Mood normal.        Behavior: Behavior normal.     BP 116/76   Pulse 77   Temp (!) 97.3 F (36.3 C) (Temporal)   Ht 5\' 11"  (1.803 m)   Wt 198 lb 3.2 oz (89.9 kg)   SpO2 96%   BMI 27.64 kg/m  Wt Readings from Last 3 Encounters:  12/25/20 198 lb 3.2 oz (89.9 kg)  12/10/20 203 lb 9.6 oz (92.4 kg)  11/11/20 203 lb 9.6 oz (92.4 kg)     Health Maintenance Due  Topic Date Due  . PNEUMOCOCCAL POLYSACCHARIDE VACCINE AGE 15-64 HIGH RISK  Never done  . FOOT EXAM  07/07/2020  . URINE MICROALBUMIN  10/07/2020    There are no preventive care reminders to display for this patient.  Lab Results  Component Value Date   TSH 1.02 07/08/2019    Lab Results  Component Value Date   WBC 6.7 09/22/2020   HGB 14.8 09/22/2020   HCT 45.1 09/22/2020   MCV 86.5 09/22/2020   PLT 174.0 09/22/2020   Lab Results  Component Value Date   NA 136 09/22/2020   K 4.0 09/22/2020   CO2 26 09/22/2020   GLUCOSE 198 (H) 09/22/2020   BUN 16 09/22/2020   CREATININE 1.09 09/22/2020   BILITOT 0.4 09/22/2020   ALKPHOS 70 09/22/2020   AST 14 09/22/2020   ALT 13 09/22/2020   PROT 7.1 09/22/2020   ALBUMIN 4.7 09/22/2020   CALCIUM 9.4 09/22/2020   ANIONGAP 9 06/28/2019   GFR 77.19 09/22/2020   Lab Results  Component Value Date   CHOL 141 09/22/2020   Lab Results  Component Value Date   HDL 56.30 09/22/2020   Lab Results  Component Value Date   LDLCALC 60 09/22/2020   Lab Results  Component Value Date   TRIG 124.0 09/22/2020   Lab Results  Component Value Date   CHOLHDL 3 09/22/2020   Lab Results  Component Value Date   HGBA1C 7.2 (H) 09/22/2020      Assessment & Plan:   Problem List Items Addressed This Visit      Nervous and Auditory   Abrasion of left pinna - Primary   Relevant Medications   mupirocin ointment (BACTROBAN) 2 %  Other Relevant Orders   Ambulatory referral to Dermatology     Musculoskeletal and Integument   Abrasion of left knee      Meds ordered this encounter  Medications  . mupirocin ointment (BACTROBAN) 2 %    Sig: Apply a thin coat to sores in left ear twice daily.    Dispense:  15 g    Refill:  0    Follow-up: No follow-ups on file.  Given information on abrasions and also avoiding injuries with running.  Follow-up as previously scheduled for his chronic medical issues. Libby Maw, MD

## 2020-12-28 ENCOUNTER — Telehealth: Payer: Self-pay | Admitting: Family Medicine

## 2020-12-28 NOTE — Telephone Encounter (Signed)
Patient is calling to speak to his provider or the nurse. Asked him if I could assist him and he said no, he just needs to speak to either the nurse or provider. Please call him back at [phone].

## 2020-12-29 NOTE — Telephone Encounter (Signed)
Returned patients call who was calling to know how long should he wait after eating to work out.

## 2020-12-30 ENCOUNTER — Telehealth (HOSPITAL_COMMUNITY): Payer: Self-pay | Admitting: *Deleted

## 2020-12-30 ENCOUNTER — Other Ambulatory Visit (HOSPITAL_COMMUNITY): Payer: Self-pay | Admitting: *Deleted

## 2020-12-30 MED ORDER — AMANTADINE HCL 100 MG PO CAPS: 100.0000 mg | ORAL_CAPSULE | Freq: Two times a day (BID) | ORAL | 0 refills | Status: DC

## 2020-12-30 NOTE — Telephone Encounter (Signed)
Kim he already had an appointment with Dr.Cobos . If Dr.Cobos unavailable ?

## 2020-12-30 NOTE — Telephone Encounter (Signed)
Pharmacy request received for Amantdine.  Patient does not have an appointment until 02/08/21.  He was Glenn Robertson's patient.

## 2020-12-30 NOTE — Telephone Encounter (Signed)
I have sent Dr Parke Poisson a message.

## 2020-12-30 NOTE — Telephone Encounter (Signed)
Patient needs refill of Amantadine.  Next appointment 02/14/21.

## 2020-12-31 ENCOUNTER — Other Ambulatory Visit: Payer: Medicare Other

## 2021-01-02 DIAGNOSIS — Z23 Encounter for immunization: Secondary | ICD-10-CM | POA: Diagnosis not present

## 2021-01-05 ENCOUNTER — Ambulatory Visit: Payer: Medicare Other | Admitting: Orthopaedic Surgery

## 2021-01-06 ENCOUNTER — Inpatient Hospital Stay: Admission: RE | Admit: 2021-01-06 | Payer: Medicare Other | Source: Ambulatory Visit

## 2021-01-07 ENCOUNTER — Other Ambulatory Visit: Payer: Self-pay

## 2021-01-07 ENCOUNTER — Ambulatory Visit
Admission: RE | Admit: 2021-01-07 | Discharge: 2021-01-07 | Disposition: A | Discharge status: Home / Self Care | Payer: Medicare Other | Source: Ambulatory Visit | Attending: Surgery | Admitting: Surgery

## 2021-01-07 DIAGNOSIS — M545 Low back pain, unspecified: Secondary | ICD-10-CM | POA: Diagnosis not present

## 2021-01-07 DIAGNOSIS — M5416 Radiculopathy, lumbar region: Secondary | ICD-10-CM

## 2021-01-07 DIAGNOSIS — M5136 Other intervertebral disc degeneration, lumbar region: Secondary | ICD-10-CM

## 2021-01-07 DIAGNOSIS — M48061 Spinal stenosis, lumbar region without neurogenic claudication: Secondary | ICD-10-CM | POA: Diagnosis not present

## 2021-01-11 ENCOUNTER — Telehealth (HOSPITAL_COMMUNITY): Payer: Self-pay

## 2021-01-11 MED ORDER — AMANTADINE HCL 100 MG PO CAPS: 100.0000 mg | ORAL_CAPSULE | Freq: Two times a day (BID) | ORAL | 0 refills | Status: DC

## 2021-01-11 NOTE — Telephone Encounter (Signed)
Medication management - Fax from pt's [facility] requesting a  90 day order of pt's prescribed Amantadine.  Dr. Parke Poisson authorized this 90 day order to be sent and requested  this nurse send the new order to pt's [facility].  Order e-scribed as approved and ordered by Dr. Parke Poisson this date for a 90 day supply of Amantadine 100 mg, 1 capsule by mouth twice daily, #180 with no refills.

## 2021-01-12 ENCOUNTER — Ambulatory Visit (INDEPENDENT_AMBULATORY_CARE_PROVIDER_SITE_OTHER): Payer: Medicare Other | Admitting: Orthopaedic Surgery

## 2021-01-12 ENCOUNTER — Encounter: Payer: Self-pay | Admitting: Orthopaedic Surgery

## 2021-01-12 VITALS — BP 113/74 | HR 87

## 2021-01-12 DIAGNOSIS — M5136 Other intervertebral disc degeneration, lumbar region: Secondary | ICD-10-CM | POA: Diagnosis not present

## 2021-01-12 NOTE — Progress Notes (Signed)
Office Visit Note   Patient: Glenn Robertson           Date of Birth: 11-09-1965           MRN: [id number] Visit Date: 01/12/2021              Requested by: Libby Maw, MD [address] [place],  [place] [address] PCP: Libby Maw, MD   Assessment & Plan: Visit Diagnoses:  1. Lumbar degenerative disc disease     Plan: We discussed the edema changes around L5-S1 disc level.  He does have some foraminal stenosis.  He requested repeat epidural which gave him many months of relief in the past with Dr. Ernestina Patches.  He can follow-up with me as needed.  No surgery is recommended at this time.  Follow-Up Instructions: No follow-ups on file.   Orders:  Orders Placed This Encounter  Procedures  . Ambulatory referral to Physical Medicine Rehab   No orders of the defined types were placed in this encounter.     Procedures: No procedures performed   Clinical Data: No additional findings.   Subjective: Chief Complaint  Patient presents with  . Lower Back - Pain    HPI 55 year old male returns post lumbar MRI scan.  He states his pain comes and goes tends to bother him in the morning.  He is not taking anything for the pain currently tried Tylenol but it did not work.  MRI scan from 01/08/2021 is available for review which shows some moderate L5 foraminal stenosis endplate edema changes at L5-S1.  Mild bulge L2-3, L3-4 and L4-5 without significant stenosis. Patient not working he is currently studying [place] language.  Past history of epidural gave him good relief last year. Review of Systems paranoid schizo phrenic disorder chronic.  History of elevated cholesterol hypertension.  Lumbar disc degeneration.  Type 2 diabetes.  He wanted to All other systems noncontributory to HPI.  Objective: Vital Signs: BP 113/74   Pulse 87   Physical Exam Constitutional:      Appearance: He is well-developed.  HENT:     Head: Normocephalic and atraumatic.  Eyes:      Pupils: Pupils are equal, round, and reactive to light.  Neck:     Thyroid: No thyromegaly.     Trachea: No tracheal deviation.  Cardiovascular:     Rate and Rhythm: Normal rate.  Pulmonary:     Effort: Pulmonary effort is normal.     Breath sounds: No wheezing.  Abdominal:     General: Bowel sounds are normal.     Palpations: Abdomen is soft.  Skin:    General: Skin is warm and dry.     Capillary Refill: Capillary refill takes less than 2 seconds.  Neurological:     Mental Status: He is alert and oriented to person, place, and time.  Psychiatric:        Behavior: Behavior normal.        Thought Content: Thought content normal.        Judgment: Judgment normal.     Ortho Exam patient is able to heel and toe walk.  Negative straight leg raise 90 degrees negative logroll of the hips.  Anterior tib EHL strong.  Specialty Comments:  No specialty comments available.  Imaging: CLINICAL DATA:  Initial evaluation for worsening back pain with left lower extremity radiculopathy.  EXAM: MRI LUMBAR SPINE WITHOUT CONTRAST  TECHNIQUE: Multiplanar, multisequence MR imaging of the lumbar spine was performed. No intravenous  contrast was administered.  COMPARISON:  Previous radiograph from 11/11/2020 as well as prior MRI from 06/15/2019.  FINDINGS: Segmentation: Standard. Lowest well-formed disc space labeled the L5-S1 level.  Alignment: Physiologic with preservation of the normal lumbar lordosis. No listhesis.  Vertebrae: Vertebral body height maintained without acute or chronic fracture. Bone marrow signal intensity within normal limits. Probable small atypical hemangioma noted within the T12 vertebral body, stable. No other discrete or worrisome osseous lesions. Discogenic reactive endplate change with marrow edema present about the L5-S1 interspace. No other abnormal marrow edema.  Conus medullaris and cauda equina: Conus extends to the L1-2 level. Conus and  cauda equina appear normal.  Paraspinal and other soft tissues: Paraspinous soft tissues within normal limits. Visualized visceral structures are unremarkable.  Disc levels:  L1-2:  Unremarkable.  L2-3: Mild degenerative intervertebral disc space narrowing with disc bulge and disc desiccation. Associated mild reactive endplate spurring. No spinal stenosis. No more than mild bilateral foraminal narrowing. No impingement. Appearance is stable.  L3-4: Mild degenerative intervertebral disc space narrowing with diffuse disc bulge and disc desiccation. Minimal reactive endplate spurring. No spinal stenosis. Mild bilateral L3 foraminal narrowing without frank impingement. Appearance is stable.  L4-5: Minimal disc bulge, slightly eccentric to the left. Annular fissure at the level of the left neural foramen. No significant spinal stenosis. Mild bilateral L4 foraminal narrowing, slightly worse on the left. No frank impingement. Appearance is stable.  L5-S1: Advanced degenerative intervertebral disc space narrowing with disc desiccation and diffuse disc bulge. Associated reactive endplate change with marginal endplate osteophytic spurring and marrow edema. Superimposed broad-based central to left subarticular disc protrusion contacts the descending left S1 nerve root as it courses through the left lateral recess (series 5, image 39). No frank neural impingement. Mild epidural lipomatosis. No significant canal or lateral recess stenosis. Moderate bilateral L5 foraminal stenosis.  IMPRESSION: 1. Broad-based central 2 left subarticular disc protrusion at L5-S1, contacting and potentially irritating the descending left S1 nerve root. 2. Moderate bilateral L5 foraminal stenosis related to disc bulge, reactive endplate change, and marrow edema. 3. Discogenic reactive endplate change with marrow edema about the L5-S1 level, which could contribute to lower back pain. 4. Additional  mild noncompressive disc bulging at L2-3 through L4-5 without significant stenosis or impingement.   Electronically Signed   By: Jeannine Boga M.D.   On: 01/08/2021 04:14    PMFS History: Patient Active Problem List   Diagnosis Date Noted  . Abrasion of left pinna 12/25/2020  . Abrasion of left knee 12/25/2020  . Tremor due to multiple drugs 10/02/2020  . Seborrheic dermatitis 10/02/2020  . Polyneuropathy 05/12/2020  . Elevated cholesterol 10/08/2019  . Benign prostatic hyperplasia with nocturia 10/08/2019  . Schizoaffective disorder, depressive type ([place]) 11/27/2018  . Urinary hesitancy 10/08/2018  . Erectile dysfunction 09/24/2018  . Arm numbness 08/20/2018  . Transient alteration of awareness 08/08/2018  . Tendinopathy of right gluteus medius 05/22/2018  . Pain of right hip joint 05/08/2018  . Hearing difficulty 04/13/2018  . Trochanteric bursitis, right hip 03/23/2018  . Asthma 03/16/2018  . Headache 03/02/2018  . Type 2 diabetes mellitus without complication, without long-term current use of insulin ([place]) 02/16/2018  . Healthcare maintenance 02/16/2018  . Pain in left leg 10/28/2016  . Lumbosacral radiculopathy at S1 03/15/2016  . Lumbar degenerative disc disease 12/18/2015  . Disc displacement, lumbar 04/21/2015  . Chondromalacia of both patellae 02/24/2015  . Paranoid schizophrenia, chronic condition ([place]) 02/24/2015  . Right anterior knee pain 07/18/2014  .  Right ankle pain 01/27/2014  . Pain in joint, ankle and foot 01/27/2014  . Sciatica 01/10/2012  . Disturbance of skin sensation 11/14/2011   Past Medical History:  Diagnosis Date  . Allergy    dog and cats, citrus  . Anxiety   . Asthma   . Chronic pain   . Depression   . Diabetes mellitus   . Glaucoma   . Neuromuscular disorder ([place])   . Paranoid schizophrenia ([employer])     Family History  Problem Relation Age of Onset  . Diabetes Father   . Diabetes Paternal Uncle   . Colon cancer Neg Hx    . Colon polyps Neg Hx   . Esophageal cancer Neg Hx   . Rectal cancer Neg Hx   . Stomach cancer Neg Hx     Past Surgical History:  Procedure Laterality Date  . ANKLE ARTHROSCOPY    . Arm Surgery  1/12   rt ulnar nerve decompression  . EPIDURAL BLOCK INJECTION     several  . ULNAR NERVE TRANSPOSITION  01/19/2012   Procedure: ULNAR NERVE DECOMPRESSION/TRANSPOSITION;  Surgeon: Cammie Sickle., MD;  Location: [place];  Service: Orthopedics;  Laterality: Left;  ulnar nerve decompression vs transposition left elbow   Social History   Occupational History  . Occupation: Unemlpoyed-disabled  Tobacco Use  . Smoking status: Never Smoker  . Smokeless tobacco: Never Used  Vaping Use  . Vaping Use: Never used  Substance and Sexual Activity  . Alcohol use: No  . Drug use: No  . Sexual activity: Not on file

## 2021-01-18 ENCOUNTER — Telehealth (INDEPENDENT_AMBULATORY_CARE_PROVIDER_SITE_OTHER): Payer: Medicare Other | Admitting: Family Medicine

## 2021-01-18 ENCOUNTER — Telehealth: Payer: Self-pay | Admitting: Physical Medicine and Rehabilitation

## 2021-01-18 ENCOUNTER — Encounter: Payer: Self-pay | Admitting: Family Medicine

## 2021-01-18 VITALS — Temp 97.1°F | Ht 71.0 in | Wt 199.0 lb

## 2021-01-18 DIAGNOSIS — J309 Allergic rhinitis, unspecified: Secondary | ICD-10-CM | POA: Diagnosis not present

## 2021-01-18 DIAGNOSIS — H6192 Disorder of left external ear, unspecified: Secondary | ICD-10-CM | POA: Insufficient documentation

## 2021-01-18 DIAGNOSIS — J4520 Mild intermittent asthma, uncomplicated: Secondary | ICD-10-CM

## 2021-01-18 MED ORDER — PREDNISONE 20 MG PO TABS: 20.0000 mg | ORAL_TABLET | Freq: Two times a day (BID) | ORAL | 0 refills | Status: AC

## 2021-01-18 NOTE — Telephone Encounter (Signed)
Patient called needing to reschedule his appointment. The number to contact patient is [phone]

## 2021-01-18 NOTE — Telephone Encounter (Signed)
Called pt and LVM #1 

## 2021-01-18 NOTE — Progress Notes (Signed)
Established Patient Office Visit  Subjective:  Patient ID: Glenn Robertson, male    DOB: July 25, 1966  Age: 55 y.o. MRN: [id number]  CC:  Chief Complaint  Patient presents with  . Allergies    Cough that come and go x 10 days. Per patient last coughing episode there was thick mucus that came up while vomiting.     HPI Glenn Robertson presents for evaluation and treatment of a 3-week history of cough with wheeze associated with running.  His rescue inhaler does not seem to be helping as much.  He has 67 puffs left.  This inhaler does not expire until 2023.  History of year-round allergy issues.  He is allergic to dogs cats and citrus.  He had noticed that fresh cut grass seems to affect him.  I denies chest pain, diaphoresis or shortness of breath.  He takes Human resources officer, [employer], production).  Using for an ointment did not help with the lesions on his left pinna.  Past Medical History:  Diagnosis Date  . Allergy    dog and cats, citrus  . Anxiety   . Asthma   . Chronic pain   . Depression   . Diabetes mellitus   . Glaucoma   . Neuromuscular disorder ([place])   . Paranoid schizophrenia [facility])     Past Surgical History:  Procedure Laterality Date  . ANKLE ARTHROSCOPY    . Arm Surgery  1/12   rt ulnar nerve decompression  . EPIDURAL BLOCK INJECTION     several  . ULNAR NERVE TRANSPOSITION  01/19/2012   Procedure: ULNAR NERVE DECOMPRESSION/TRANSPOSITION;  Surgeon: Cammie Sickle., MD;  Location: [place];  Service: Orthopedics;  Laterality: Left;  ulnar nerve decompression vs transposition left elbow    Family History  Problem Relation Age of Onset  . Diabetes Father   . Diabetes Paternal Uncle   . Colon cancer Neg Hx   . Colon polyps Neg Hx   . Esophageal cancer Neg Hx   . Rectal cancer Neg Hx   . Stomach cancer Neg Hx     Social History   Socioeconomic History  . Marital status: Single    Spouse name: Not on file  . Number of children: 0  .  Years of education: BA  . Highest education level: Not on file  Occupational History  . Occupation: Unemlpoyed-disabled  Tobacco Use  . Smoking status: Never Smoker  . Smokeless tobacco: Never Used  Vaping Use  . Vaping Use: Never used  Substance and Sexual Activity  . Alcohol use: No  . Drug use: No  . Sexual activity: Not on file  Other Topics Concern  . Not on file  Social History Narrative   Lives with mother   Caffeine use: Coke very rare   Right handed    Social Determinants of Health   Financial Resource Strain: Low Risk   . Difficulty of Paying Living Expenses: Not hard at all  Food Insecurity: No Food Insecurity  . Worried About Charity fundraiser in the Last Year: Never true  . Ran Out of Food in the Last Year: Never true  Transportation Needs: No Transportation Needs  . Lack of Transportation (Medical): No  . Lack of Transportation (Non-Medical): No  Physical Activity: Inactive  . Days of Exercise per Week: 0 days  . Minutes of Exercise per Session: 0 min  Stress: Not on file  Social Connections: Socially Isolated  . Frequency of Communication  with Friends and Family: Once a week  . Frequency of Social Gatherings with Friends and Family: Once a week  . Attends Religious Services: Never  . Active Member of Clubs or Organizations: No  . Attends Archivist Meetings: Never  . Marital Status: Never married  Intimate Partner Violence: Not on file    Outpatient Medications Prior to Visit  Medication Sig Dispense Refill  . ACCU-CHEK SOFTCLIX LANCETS lancets 3 (three) times daily. for testing  0  . albuterol (VENTOLIN HFA) 108 (90 Base) MCG/ACT inhaler INHALE 2 PUFFS BY MOUTH INTO THE LUNGS AS NEEDED 18 g 5  . amantadine (SYMMETREL) 100 MG capsule Take 1 capsule (100 mg total) by mouth 2 (two) times daily. 180 capsule 0  . Cariprazine HCl (VRAYLAR) 6 MG CAPS TAKE 1 CAPSULE(6 MG) BY MOUTH DAILY 30 capsule 5  . clonazePAM (KLONOPIN) 1 MG tablet Take 1  tablet (1 mg total) by mouth 3 (three) times daily as needed for anxiety. for anxiety 90 tablet 2  . clotrimazole-betamethasone (LOTRISONE) cream Apply 1 application topically 2 (two) times daily. 45 g 0  . fluPHENAZine (PROLIXIN) 5 MG tablet TAKE 1 TABLET(5 MG) BY MOUTH AT BEDTIME 90 tablet 0  . fluticasone (FLONASE) 50 MCG/ACT nasal spray SHAKE LIQUID AND USE 2 SPRAYS IN EACH NOSTRIL DAILY 16 g 6  . glipiZIDE (GLUCOTROL) 10 MG tablet TAKE 1 TABLET(10 MG) BY MOUTH TWICE DAILY 180 tablet 3  . glucose blood (ACCU-CHEK AVIVA PLUS) test strip 1 each by Other route daily. TEST DAILY 100 strip 0  . JARDIANCE 25 MG TABS tablet TAKE 1 TABLET BY MOUTH DAILY 90 tablet 2  . lamoTRIgine (LAMICTAL) 100 MG tablet Take 1 tablet by mouth twice daily 180 tablet 0  . LUMIGAN 0.01 % SOLN INSTILL 1 DROP INTO AFFECTED EYE ONCE AT BEDTIME    . meloxicam (MOBIC) 15 MG tablet Take 1 tablet (15 mg total) by mouth daily. 30 tablet 3  . metFORMIN (GLUCOPHAGE-XR) 500 MG 24 hr tablet TAKE 2 TABLETS(1000 MG) BY MOUTH TWICE DAILY 360 tablet 1  . montelukast (SINGULAIR) 10 MG tablet TAKE 1 TABLET(10 MG) BY MOUTH AT BEDTIME 90 tablet 3  . sertraline (ZOLOFT) 100 MG tablet Take 1 tablet (100 mg total) by mouth daily. 90 tablet 1  . simvastatin (ZOCOR) 20 MG tablet TAKE 1 TABLET(20 MG) BY MOUTH EVERY EVENING 90 tablet 3  . tadalafil (CIALIS) 20 MG tablet TAKE ONE-HALF TO ONE TABLET BY MOUTH EVERY OTHER DAY AS NEEDED FOR ERECTILE DYSFUNCTION 10 tablet 4  . triamcinolone (KENALOG) 0.1 % Apply 1 application topically 2 (two) times daily. 30 g 0  . TYRVAYA 0.03 MG/ACT SOLN Spray 1 spray in each nostril twice daily, approximately 12 hours apart    . mupirocin ointment (BACTROBAN) 2 % Apply a thin coat to sores in left ear twice daily. 15 g 0  . RESTASIS 0.05 % ophthalmic emulsion  (Patient not taking: No sig reported)     No facility-administered medications prior to visit.    Allergies  Allergen Reactions  . Citric Acid Other  (See Comments)    Runny nose, eyes water  . Food     Oranges or anything with citric acid  . Gabapentin     Walking into things, hard to maintain balance, falls    ROS Review of Systems  Constitutional: Negative for chills, diaphoresis, fatigue, fever and unexpected weight change.  HENT: Positive for postnasal drip and sneezing. Negative for voice change.  Eyes: Negative for photophobia and visual disturbance.  Respiratory: Positive for cough and wheezing. Negative for shortness of breath.   Cardiovascular: Negative for chest pain.  Gastrointestinal: Negative.  Negative for nausea.  Musculoskeletal: Negative for gait problem.      Objective:    Physical Exam Vitals and nursing note reviewed.  Constitutional:      Appearance: Normal appearance.  HENT:     Head: Normocephalic and atraumatic.  Eyes:     General: No scleral icterus.       Right eye: No discharge.        Left eye: No discharge.     Conjunctiva/sclera: Conjunctivae normal.  Pulmonary:     Effort: Pulmonary effort is normal.  Neurological:     Mental Status: He is alert and oriented to person, place, and time.  Psychiatric:        Mood and Affect: Mood normal.        Behavior: Behavior normal.     Temp (!) 97.1 F (36.2 C) (Temporal)   Ht 5\' 11"  (1.803 m)   Wt 199 lb (90.3 kg)   BMI 27.75 kg/m  Wt Readings from Last 3 Encounters:  01/18/21 199 lb (90.3 kg)  12/25/20 198 lb 3.2 oz (89.9 kg)  12/10/20 203 lb 9.6 oz (92.4 kg)     Health Maintenance Due  Topic Date Due  . PNEUMOCOCCAL POLYSACCHARIDE VACCINE AGE 57-64 HIGH RISK  Never done  . FOOT EXAM  07/07/2020  . URINE MICROALBUMIN  10/07/2020    There are no preventive care reminders to display for this patient.  Lab Results  Component Value Date   TSH 1.02 07/08/2019   Lab Results  Component Value Date   WBC 6.7 09/22/2020   HGB 14.8 09/22/2020   HCT 45.1 09/22/2020   MCV 86.5 09/22/2020   PLT 174.0 09/22/2020   Lab Results   Component Value Date   NA 136 09/22/2020   K 4.0 09/22/2020   CO2 26 09/22/2020   GLUCOSE 198 (H) 09/22/2020   BUN 16 09/22/2020   CREATININE 1.09 09/22/2020   BILITOT 0.4 09/22/2020   ALKPHOS 70 09/22/2020   AST 14 09/22/2020   ALT 13 09/22/2020   PROT 7.1 09/22/2020   ALBUMIN 4.7 09/22/2020   CALCIUM 9.4 09/22/2020   ANIONGAP 9 06/28/2019   GFR 77.19 09/22/2020   Lab Results  Component Value Date   CHOL 141 09/22/2020   Lab Results  Component Value Date   HDL 56.30 09/22/2020   Lab Results  Component Value Date   LDLCALC 60 09/22/2020   Lab Results  Component Value Date   TRIG 124.0 09/22/2020   Lab Results  Component Value Date   CHOLHDL 3 09/22/2020   Lab Results  Component Value Date   HGBA1C 7.2 (H) 09/22/2020      Assessment & Plan:   Problem List Items Addressed This Visit      Respiratory   Reactive airway disease   Relevant Medications   predniSONE (DELTASONE) 20 MG tablet   Allergic rhinitis - Primary   Relevant Medications   predniSONE (DELTASONE) 20 MG tablet     Nervous and Auditory   Skin lesion of left ear   Relevant Orders   Ambulatory referral to Dermatology      Meds ordered this encounter  Medications  . predniSONE (DELTASONE) 20 MG tablet    Sig: Take 1 tablet (20 mg total) by mouth 2 (two) times daily with a meal for  7 days.    Dispense:  14 tablet    Refill:  0    Follow-up: Return if symptoms worsen or fail to improve.  Hopefully a short course of prednisone will relieve this exacerbation of exercise-induced asthma.  If not may consider steroid inhaler and/or allergist referral.  He will continue his current allergy medicines as mentioned above.  Virtual Visit via Video Note  I connected with [name] on 01/18/21 at  1:30 PM EDT by a video enabled telemedicine application and verified that I am speaking with the correct person using two identifiers.  Location: Patient: home alone.  Provider: work   I  discussed the limitations of evaluation and management by telemedicine and the availability of in person appointments. The patient expressed understanding and agreed to proceed.  History of Present Illness:    Observations/Objective:   Assessment and Plan:   Follow Up Instructions:    I discussed the assessment and treatment plan with the patient. The patient was provided an opportunity to ask questions and all were answered. The patient agreed with the plan and demonstrated an understanding of the instructions.   The patient was advised to call back or seek an in-person evaluation if the symptoms worsen or if the condition fails to improve as anticipated.  I provided25 minutes of non-face-to-face time during this encounter.   Libby Maw, MD   Libby Maw, MD

## 2021-01-20 ENCOUNTER — Ambulatory Visit (INDEPENDENT_AMBULATORY_CARE_PROVIDER_SITE_OTHER): Payer: Medicare Other | Admitting: Psychology

## 2021-01-20 DIAGNOSIS — F200 Paranoid schizophrenia: Secondary | ICD-10-CM

## 2021-01-20 DIAGNOSIS — F4310 Post-traumatic stress disorder, unspecified: Secondary | ICD-10-CM

## 2021-01-20 DIAGNOSIS — F331 Major depressive disorder, recurrent, moderate: Secondary | ICD-10-CM | POA: Diagnosis not present

## 2021-01-20 DIAGNOSIS — F411 Generalized anxiety disorder: Secondary | ICD-10-CM

## 2021-01-20 NOTE — Telephone Encounter (Signed)
Left message #2

## 2021-01-20 NOTE — Telephone Encounter (Signed)
Pt called and cancel.

## 2021-01-21 ENCOUNTER — Ambulatory Visit: Payer: Medicare Other | Admitting: Physical Medicine and Rehabilitation

## 2021-01-26 ENCOUNTER — Other Ambulatory Visit: Payer: Self-pay | Admitting: Family Medicine

## 2021-01-26 DIAGNOSIS — J4520 Mild intermittent asthma, uncomplicated: Secondary | ICD-10-CM

## 2021-01-26 MED ORDER — ALBUTEROL SULFATE HFA 108 (90 BASE) MCG/ACT IN AERS: 1.0000 | INHALATION_SPRAY | Freq: Four times a day (QID) | RESPIRATORY_TRACT | 1 refills | Status: DC | PRN

## 2021-01-26 NOTE — Addendum Note (Signed)
Addended by: Jon Billings on: 01/26/2021 04:03 PM   Modules accepted: Orders

## 2021-01-27 ENCOUNTER — Telehealth (INDEPENDENT_AMBULATORY_CARE_PROVIDER_SITE_OTHER): Payer: Medicare Other | Admitting: Psychiatry

## 2021-01-27 ENCOUNTER — Other Ambulatory Visit: Payer: Self-pay

## 2021-01-27 ENCOUNTER — Other Ambulatory Visit (HOSPITAL_COMMUNITY): Payer: Self-pay | Admitting: Psychiatry

## 2021-01-27 ENCOUNTER — Encounter (HOSPITAL_COMMUNITY): Payer: Self-pay | Admitting: Psychiatry

## 2021-01-27 DIAGNOSIS — R404 Transient alteration of awareness: Secondary | ICD-10-CM | POA: Diagnosis not present

## 2021-01-27 DIAGNOSIS — L218 Other seborrheic dermatitis: Secondary | ICD-10-CM | POA: Diagnosis not present

## 2021-01-27 DIAGNOSIS — F251 Schizoaffective disorder, depressive type: Secondary | ICD-10-CM | POA: Diagnosis not present

## 2021-01-27 DIAGNOSIS — L280 Lichen simplex chronicus: Secondary | ICD-10-CM | POA: Diagnosis not present

## 2021-01-27 MED ORDER — SERTRALINE HCL 100 MG PO TABS: 100.0000 mg | ORAL_TABLET | Freq: Every day | ORAL | 1 refills | Status: DC

## 2021-01-27 MED ORDER — VRAYLAR 6 MG PO CAPS: ORAL_CAPSULE | ORAL | 1 refills | Status: DC

## 2021-01-27 MED ORDER — FLUPHENAZINE HCL 5 MG PO TABS: ORAL_TABLET | ORAL | 1 refills | Status: DC

## 2021-01-27 MED ORDER — CLONAZEPAM 1 MG PO TABS: 1.0000 mg | ORAL_TABLET | Freq: Three times a day (TID) | ORAL | 1 refills | Status: DC | PRN

## 2021-01-27 MED ORDER — AMANTADINE HCL 100 MG PO CAPS: 100.0000 mg | ORAL_CAPSULE | Freq: Two times a day (BID) | ORAL | 1 refills | Status: DC

## 2021-01-27 NOTE — Progress Notes (Signed)
Glenn Robertson OP Progress Note  12/14/2020 Glenn Robertson  MRN:  [id number]   Chief Complaint:  Patient returns for medication management  This was an in person, face to face appointment  Duration 25 minutes  HPI: 55yo male, who has been diagnosed with schizoaffective disorder.  He was being followed by Dr . Montel Culver, who has left the clinic, and I am providing follow ups /bridging until new provider starts .  Patient reports he has been doing "OK". He presents calm, polite on approach, vaguely /subtly guarded particularly at start of session, cooperative. No psychomotor agitation or restlessness . He is well groomed.  Denies significant depression or neuro-vegetative symptoms . No SI, and presents future oriented, for example stating that he is currently learning portuguese and plans to learn other languages in the future .  He has a history of chronic paranoid ideations - please refer to prior notes . Today he appears less focused on persecutory ideations . On last visit had described seeing a vehicle driving around his home , monitoring him, but today does not mention, and when specifically asked , answered " oh, they stopped coming around ". He also makes no mention today of someone from his past monitoring or harassing him . Today more focused on languages he is learning.  Denies medication side effects. Note he is on two antipsychotics, which appear to be an effective regimen for him at present.  AIMS test done - no abnormal involuntary movements noted or reported .      Visit Diagnosis:  No diagnosis found.  Past Psychiatric History: Please see intake H&P.  Past Medical History:  Past Medical History:  Diagnosis Date  . Allergy    dog and cats, citrus  . Anxiety   . Asthma   . Chronic pain   . Depression   . Diabetes mellitus   . Glaucoma   . Neuromuscular disorder ([place])   . Paranoid schizophrenia [facility])     Past Surgical History:  Procedure Laterality Date  .  ANKLE ARTHROSCOPY    . Arm Surgery  1/12   rt ulnar nerve decompression  . EPIDURAL BLOCK INJECTION     several  . ULNAR NERVE TRANSPOSITION  01/19/2012   Procedure: ULNAR NERVE DECOMPRESSION/TRANSPOSITION;  Surgeon: Cammie Sickle., MD;  Location: [place];  Service: Orthopedics;  Laterality: Left;  ulnar nerve decompression vs transposition left elbow    Family Psychiatric History: None.  Family History:  Family History  Problem Relation Age of Onset  . Diabetes Father   . Diabetes Paternal Uncle   . Colon cancer Neg Hx   . Colon polyps Neg Hx   . Esophageal cancer Neg Hx   . Rectal cancer Neg Hx   . Stomach cancer Neg Hx     Social History:  Social History   Socioeconomic History  . Marital status: Single    Spouse name: Not on file  . Number of children: 0  . Years of education: BA  . Highest education level: Not on file  Occupational History  . Occupation: Unemlpoyed-disabled  Tobacco Use  . Smoking status: Never Smoker  . Smokeless tobacco: Never Used  Vaping Use  . Vaping Use: Never used  Substance and Sexual Activity  . Alcohol use: No  . Drug use: No  . Sexual activity: Not on file  Other Topics Concern  . Not on file  Social History Narrative   Lives with mother   Caffeine use: Coke  very rare   Right handed    Social Determinants of Health   Financial Resource Strain: Low Risk   . Difficulty of Paying Living Expenses: Not hard at all  Food Insecurity: No Food Insecurity  . Worried About Charity fundraiser in the Last Year: Never true  . Ran Out of Food in the Last Year: Never true  Transportation Needs: No Transportation Needs  . Lack of Transportation (Medical): No  . Lack of Transportation (Non-Medical): No  Physical Activity: Inactive  . Days of Exercise per Week: 0 days  . Minutes of Exercise per Session: 0 min  Stress: Not on file  Social Connections: Socially Isolated  . Frequency of Communication with Friends and  Family: Once a week  . Frequency of Social Gatherings with Friends and Family: Once a week  . Attends Religious Services: Never  . Active Member of Clubs or Organizations: No  . Attends Archivist Meetings: Never  . Marital Status: Never married    Allergies:  Allergies  Allergen Reactions  . Citric Acid Other (See Comments)    Runny nose, eyes water  . Food     Oranges or anything with citric acid  . Gabapentin     Walking into things, hard to maintain balance, falls    Metabolic Disorder Labs: Lab Results  Component Value Date   HGBA1C 7.2 (H) 09/22/2020   MPG 123 03/02/2018   No results found for: PROLACTIN Lab Results  Component Value Date   CHOL 141 09/22/2020   TRIG 124.0 09/22/2020   HDL 56.30 09/22/2020   CHOLHDL 3 09/22/2020   VLDL 24.8 09/22/2020   LDLCALC 60 09/22/2020   LDLCALC 41 04/06/2020   Lab Results  Component Value Date   TSH 1.02 07/08/2019   TSH 0.92 03/15/2018    Therapeutic Level Labs: No results found for: LITHIUM No results found for: VALPROATE No components found for:  CBMZ  Current Medications: Current Outpatient Medications  Medication Sig Dispense Refill  . ACCU-CHEK SOFTCLIX LANCETS lancets 3 (three) times daily. for testing  0  . albuterol (VENTOLIN HFA) 108 (90 Base) MCG/ACT inhaler Inhale 1-2 puffs into the lungs every 6 (six) hours as needed for wheezing or shortness of breath. 18 g 1  . amantadine (SYMMETREL) 100 MG capsule Take 1 capsule (100 mg total) by mouth 2 (two) times daily. 180 capsule 0  . Cariprazine HCl (VRAYLAR) 6 MG CAPS TAKE 1 CAPSULE(6 MG) BY MOUTH DAILY 30 capsule 5  . clonazePAM (KLONOPIN) 1 MG tablet Take 1 tablet (1 mg total) by mouth 3 (three) times daily as needed for anxiety. for anxiety 90 tablet 2  . clotrimazole-betamethasone (LOTRISONE) cream Apply 1 application topically 2 (two) times daily. 45 g 0  . fluPHENAZine (PROLIXIN) 5 MG tablet TAKE 1 TABLET(5 MG) BY MOUTH AT BEDTIME 90 tablet 0   . fluticasone (FLONASE) 50 MCG/ACT nasal spray SHAKE LIQUID AND USE 2 SPRAYS IN EACH NOSTRIL DAILY 16 g 6  . glipiZIDE (GLUCOTROL) 10 MG tablet TAKE 1 TABLET(10 MG) BY MOUTH TWICE DAILY 180 tablet 3  . glucose blood (ACCU-CHEK AVIVA PLUS) test strip 1 each by Other route daily. TEST DAILY 100 strip 0  . JARDIANCE 25 MG TABS tablet TAKE 1 TABLET BY MOUTH DAILY 90 tablet 2  . lamoTRIgine (LAMICTAL) 100 MG tablet Take 1 tablet by mouth twice daily 180 tablet 0  . LUMIGAN 0.01 % SOLN INSTILL 1 DROP INTO AFFECTED EYE ONCE AT BEDTIME    .  meloxicam (MOBIC) 15 MG tablet Take 1 tablet (15 mg total) by mouth daily. 30 tablet 3  . metFORMIN (GLUCOPHAGE-XR) 500 MG 24 hr tablet TAKE 2 TABLETS(1000 MG) BY MOUTH TWICE DAILY 360 tablet 1  . montelukast (SINGULAIR) 10 MG tablet TAKE 1 TABLET(10 MG) BY MOUTH AT BEDTIME 90 tablet 3  . mupirocin ointment (BACTROBAN) 2 % Apply a thin coat to sores in left ear twice daily. 15 g 0  . RESTASIS 0.05 % ophthalmic emulsion  (Patient not taking: No sig reported)    . sertraline (ZOLOFT) 100 MG tablet Take 1 tablet (100 mg total) by mouth daily. 90 tablet 1  . simvastatin (ZOCOR) 20 MG tablet TAKE 1 TABLET(20 MG) BY MOUTH EVERY EVENING 90 tablet 3  . tadalafil (CIALIS) 20 MG tablet TAKE ONE-HALF TO ONE TABLET BY MOUTH EVERY OTHER DAY AS NEEDED FOR ERECTILE DYSFUNCTION 10 tablet 4  . triamcinolone (KENALOG) 0.1 % Apply 1 application topically 2 (two) times daily. 30 g 0  . TYRVAYA 0.03 MG/ACT SOLN Spray 1 spray in each nostril twice daily, approximately 12 hours apart     No current facility-administered medications for this visit.       Psychiatric Specialty Exam: Review of Systems  Psychiatric/Behavioral: The patient is nervous/anxious.   All other systems reviewed and are negative. Does not endorse   There were no vitals taken for this visit.There is no height or weight on file to calculate BMI.  General Appearance:  well groomed , calm, cooperative, polite    Eye Contact:  Good   Speech:  Normal   Volume:  Normal  Mood:  Reports mood "OK", denies depression  Affect:  Appropriate, vaguely guarded/restricted   Thought Process:  Goal Directed/ linear   Orientation:  Full (Time, Place, and Person)  Thought Content: today less focused on, less ruminative about persecutory ideations and does not spontaneously express delusional , persecutory ideations , does not present internally preoccupied, denies SI .   Suicidal Thoughts:  No denies SI  Homicidal Thoughts:  No denies homicidal ideations   Memory: recent and remote grossly intact   Judgement:  Good  Insight:  Fair  Psychomotor Activity:  No restlessness or agitation, presents calm, comfortable , no akathisia noted   Concentration:  Good   Recall:  Good  Fund of Knowledge: Good  Language: Good  Akathisia:  Negative  Handed:  Right  AIMS (if indicated): does not report abnormal or involuntary movements, AIMS test unremarkable    Assets:  Communication Skills Desire for Improvement Financial Resources/Insurance Housing  ADL's:  Intact  Cognition: WNL  Sleep:  Fair   Screenings: PHQ2-9   [place] Office Visit from 12/25/2020 in [place] from 04/06/2020 in [place] from 02/26/2020 in [place] from 10/08/2019 in [place] from [facility]09/[facility]  PHQ-2 Total Score 1 1 3 2  0  PHQ-9 Total Score -- -- 11 -- --       Assessment and Plan: 55yo singleAAMwith schizophrenia paranoid type vsschizoaffective disorder.   At this time patient reports he is doing " all right". Does not endorse depression or significant neuro-vegetative symptoms and denies SI, presents future oriented . He presents vaguely guarded but polite and cooperative . Today does not exhibit paranoid ideations and appears less focused on  persecutory ideations compared to last visit. Of note, no clear negative symptoms noted ,  and patient presents well groomed, expressing interest in languages, actively learning portuguese.  Tolerating medications well. Side effects reviewed, to include potential risk of movement and motor disorders on antipsychotic meds , potential increased risk of bleeding on antidepressant and NSAID. He is on two antipsychotics ( Prolixin and Vraylar )- it appears he is responding well to this combination and was more paranoid before combination was institututed, is tolerating well so will continue for now.   Plan:  Continue Zoloft 100 mg  QDAY for depression, anxiety Continue Vraylar6mg  for paranoia, psychosis Continue Klonopin 1 mgTID PRN for anxiety Continue Amantadine 100 mg BID to minimize tremors /EPS  Continue Fluphenazine5 mg QHS for paranoia , psychosis  Side effects reviewed    Next appointment inapproximatelyin 6-8 weeks . Agrees to contact clinic sooner if any worsening prior or medication concerns      Jenne Campus, MD 01/27/2021, 2:12 PM

## 2021-01-29 ENCOUNTER — Telehealth: Payer: Self-pay | Admitting: Family Medicine

## 2021-01-29 NOTE — Telephone Encounter (Signed)
Called patient to check and see how often he was using his inhaler. Per patient he sometimes will need to use his inhale 3 times a day while running but it seems to help when he feels like his asthma is flaring up.

## 2021-01-29 NOTE — Telephone Encounter (Signed)
Pt states he missed his call and would like a cb. Please advise

## 2021-01-29 NOTE — Telephone Encounter (Signed)
Returned patients call duplicate message

## 2021-02-03 ENCOUNTER — Telehealth: Payer: Self-pay

## 2021-02-03 ENCOUNTER — Telehealth: Payer: Self-pay | Admitting: Physical Medicine and Rehabilitation

## 2021-02-03 NOTE — Telephone Encounter (Signed)
Patient called he is requesting a appointment with Dr.Newton for a injection call [phone]

## 2021-02-03 NOTE — Telephone Encounter (Signed)
Pt called wanting to make an appt with Dr. Ernestina Patches. The best call back number is [phone].

## 2021-02-04 ENCOUNTER — Telehealth: Payer: Self-pay | Admitting: Neurology

## 2021-02-04 ENCOUNTER — Encounter: Payer: Self-pay | Admitting: Family Medicine

## 2021-02-04 ENCOUNTER — Telehealth: Payer: Self-pay

## 2021-02-04 ENCOUNTER — Ambulatory Visit: Payer: Medicare Other | Admitting: Family Medicine

## 2021-02-04 NOTE — Progress Notes (Deleted)
PATIENT: Glenn Robertson DOB: 12-15-1965  REASON FOR VISIT: follow up HISTORY FROM: patient  No chief complaint on file.    HISTORY OF PRESENT ILLNESS: 02/04/2021 ALL: Linford returns for follow up. NCS/EMG 04/2020 showed no evidence of sensory, motor or autonomic polyneuropathy and mild chronic left S1 radiculopathy. He was started on lamotrigine 100mg  BID and referred to [facility] for ESI injections. MRI lumbar spine 12/2020 concerning for DDD (multilevel) with disc protrusion at L5-S1 potentially irritaitng S1 nerve, bilateral L5 foraminal stenosis.    04/01/2020 RS: Glenn Robertson is a 55 year old man noting more difficulty with balance.   I had previously seen him in 2020 for episodes of transient alteration of awareness, headaches and numbness.   He is noting more difficulty with his balance.   When he walks he veers some and he feels he might fall if he leans backwards.   He did fall once getting out of bed.     He has numbness in both feet.   He has had left sided sciatica starting 2012 that improved but recently worsened.  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   He was on gabapentin   He has had a few more spells of altered consciousness but he had no complete loss of consciousness.   We did an EEG in 09/2018 and it was normal.   He also has some numbness in his hands.   He has had ulnar nerve transpositions in the past.       He has Type 2 dm and is on metformin, glucotrol and Jardience.  10/08/2019 ALL:  Glenn Robertson is a 55 y.o. male here today for follow up transient altered awareness and numbness of extremities. EEG was normal in 09/2018. NCS/EMG showed mild ulnar neuropathy of right arm. He is not having any trouble with numbness of upper extremities. He continues to have low back pain and right hip pain. Pain radiated down the posterior thigh. He does have intermittent numbness of bilateral feet. He is followed by Dr Laurence Spates  for pain management. Lumbar spine and hip MRI were unremarkable. He is getting epidural steroid injections that seem to help temporarily. Last injection in 06/2019. He continues close follow up with Dr Montel Culver, psychiatry, for schizophrenia treated with Zoloft, Vraylar, Klonopin, trazodone and Prolixin. He is followed closely by PCP as well. DM is well controlled per patient. Last A1C 7.1.   HISTORY: (copied from my note on 11/19/2018)  Glenn Robertson is a 55 y.o. male here today for follow up for transient alteration of awareness and numbness in bilateral feet. He is doing well and without concerns. He denies episodes of altered awareness since last visit.  He does continue to complain of neuropathic pain.  Left leg and foot is worse but he has numbness in bilateral feet.  He was given a prescription of gabapentin at his last visit but reports that he never picked it up.  He does not feel that this pain is limiting him, however, it is very aggravating.   HISTORY: (copied from Dr Garth Bigness note on 08/20/2018)  I had the pleasure seeing a patient, Glenn Robertson, at [facility] for neurologic consultation regarding his episodes of transient alteration of awareness, headaches and numbness.   He has had several spells of altered awareness where he feels he loses time over the past couple years.   He denies loss of consciousness.  There is no  generalized tonic-clonic activity and no tongue biting or incontinence.  He will have a lapse of seconds but will be non-interactive and won't hear what others are saying.    The first episode occurred in 2012.   He has had several others, the last one about 2 months ago.   He was sitting at the table and felt everything went black but others at the table were not aware of his changed.   His mom witnessed an event at a restaurant where he stared blankly for several seconds and had no memory of the event.   He does not recall having an EEG.      He also  has a problem with headaches on the right.  The headaches occur weekly and last much of the day.   He may takes an NSAID but it does not always help.   He denies nausea or photophobia.       He also reports numbness in his legs.   He has a h/o disc bulges in the past.   In 2012, he has the onset of numbness in the lateral left foot felt to be due to sciatica or lumbar changes.    This year, he has had numbness in both feet.   He denies foot weakness.   No changes in his gait.   He has tenderness in the right lateral hip.       He has paranoid schizophrenia that is doing well.    He has been on Latuda.      He snores but has no witnessed OSA.    He sometimes dozes off in the evening.   REVIEW OF SYSTEMS: Out of a complete 14 system review of symptoms, the patient complains only of the following symptoms, right hip pain and all other reviewed systems are negative.   ALLERGIES: Allergies  Allergen Reactions   Citric Acid Other (See Comments)    Runny nose, eyes water   Food     Oranges or anything with citric acid   Gabapentin     Walking into things, hard to maintain balance, falls    HOME MEDICATIONS: Outpatient Medications Prior to Visit  Medication Sig Dispense Refill   ACCU-CHEK SOFTCLIX LANCETS lancets 3 (three) times daily. for testing  0   albuterol (VENTOLIN HFA) 108 (90 Base) MCG/ACT inhaler Inhale 1-2 puffs into the lungs every 6 (six) hours as needed for wheezing or shortness of breath. 18 g 1   amantadine (SYMMETREL) 100 MG capsule Take 1 capsule (100 mg total) by mouth 2 (two) times daily. 60 capsule 1   Cariprazine HCl (VRAYLAR) 6 MG CAPS TAKE 1 CAPSULE(6 MG) BY MOUTH DAILY 30 capsule 1   clonazePAM (KLONOPIN) 1 MG tablet Take 1 tablet (1 mg total) by mouth 3 (three) times daily as needed for anxiety. for anxiety 90 tablet 1   clotrimazole-betamethasone (LOTRISONE) cream Apply 1 application topically 2 (two) times daily. 45 g 0   fluPHENAZine (PROLIXIN) 5 MG tablet TAKE 1  TABLET(5 MG) BY MOUTH AT BEDTIME 30 tablet 1   fluticasone (FLONASE) 50 MCG/ACT nasal spray SHAKE LIQUID AND USE 2 SPRAYS IN EACH NOSTRIL DAILY 16 g 6   glipiZIDE (GLUCOTROL) 10 MG tablet TAKE 1 TABLET(10 MG) BY MOUTH TWICE DAILY 180 tablet 3   glucose blood (ACCU-CHEK AVIVA PLUS) test strip 1 each by Other route daily. TEST DAILY 100 strip 0   JARDIANCE 25 MG TABS tablet TAKE 1 TABLET BY MOUTH DAILY 90 tablet  2   lamoTRIgine (LAMICTAL) 100 MG tablet Take 1 tablet by mouth twice daily 180 tablet 0   LUMIGAN 0.01 % SOLN INSTILL 1 DROP INTO AFFECTED EYE ONCE AT BEDTIME     meloxicam (MOBIC) 15 MG tablet Take 1 tablet (15 mg total) by mouth daily. 30 tablet 3   metFORMIN (GLUCOPHAGE-XR) 500 MG 24 hr tablet TAKE 2 TABLETS(1000 MG) BY MOUTH TWICE DAILY 360 tablet 1   montelukast (SINGULAIR) 10 MG tablet TAKE 1 TABLET(10 MG) BY MOUTH AT BEDTIME 90 tablet 3   mupirocin ointment (BACTROBAN) 2 % Apply a thin coat to sores in left ear twice daily. 15 g 0   RESTASIS 0.05 % ophthalmic emulsion  (Patient not taking: No sig reported)     sertraline (ZOLOFT) 100 MG tablet Take 1 tablet (100 mg total) by mouth daily. 30 tablet 1   simvastatin (ZOCOR) 20 MG tablet TAKE 1 TABLET(20 MG) BY MOUTH EVERY EVENING 90 tablet 3   tadalafil (CIALIS) 20 MG tablet TAKE ONE-HALF TO ONE TABLET BY MOUTH EVERY OTHER DAY AS NEEDED FOR ERECTILE DYSFUNCTION 10 tablet 4   triamcinolone (KENALOG) 0.1 % Apply 1 application topically 2 (two) times daily. 30 g 0   TYRVAYA 0.03 MG/ACT SOLN Spray 1 spray in each nostril twice daily, approximately 12 hours apart     No facility-administered medications prior to visit.    PAST MEDICAL HISTORY: Past Medical History:  Diagnosis Date   Allergy    dog and cats, citrus   Anxiety    Asthma    Chronic pain    Depression    Diabetes mellitus    Glaucoma    Neuromuscular disorder ([place])    Paranoid schizophrenia ([place])     PAST SURGICAL HISTORY: Past Surgical History:  Procedure  Laterality Date   ANKLE ARTHROSCOPY     Arm Surgery  1/12   rt ulnar nerve decompression   EPIDURAL BLOCK INJECTION     several   ULNAR NERVE TRANSPOSITION  01/19/2012   Procedure: ULNAR NERVE DECOMPRESSION/TRANSPOSITION;  Surgeon: Cammie Sickle., MD;  Location: [place];  Service: Orthopedics;  Laterality: Left;  ulnar nerve decompression vs transposition left elbow    FAMILY HISTORY: Family History  Problem Relation Age of Onset   Diabetes Father    Diabetes Paternal Uncle    Colon cancer Neg Hx    Colon polyps Neg Hx    Esophageal cancer Neg Hx    Rectal cancer Neg Hx    Stomach cancer Neg Hx     SOCIAL HISTORY: Social History   Socioeconomic History   Marital status: Single    Spouse name: Not on file   Number of children: 0   Years of education: BA   Highest education level: Not on file  Occupational History   Occupation: Unemlpoyed-disabled  Tobacco Use   Smoking status: Never   Smokeless tobacco: Never  Vaping Use   Vaping Use: Never used  Substance and Sexual Activity   Alcohol use: No   Drug use: No   Sexual activity: Not on file  Other Topics Concern   Not on file  Social History Narrative   Lives with mother   Caffeine use: Coke very rare   Right handed    Social Determinants of Health   Financial Resource Strain: Low Risk    Difficulty of Paying Living Expenses: Not hard at all  Food Insecurity: No Food Insecurity   Worried About Running Out of  Food in the Last Year: Never true   [place] in the Last Year: Never true  Transportation Needs: No Transportation Needs   Lack of Transportation (Medical): No   Lack of Transportation (Non-Medical): No  Physical Activity: Inactive   Days of Exercise per Week: 0 days   Minutes of Exercise per Session: 0 min  Stress: Not on file  Social Connections: Socially Isolated   Frequency of Communication with Friends and Family: Once a week   Frequency of Social Gatherings with  Friends and Family: Once a week   Attends Religious Services: Never   Marine scientist or Organizations: No   Attends Archivist Meetings: Never   Marital Status: Never married  Human resources officer Violence: Not on file      PHYSICAL EXAM  There were no vitals filed for this visit.  There is no height or weight on file to calculate BMI.  Generalized: Well developed, in no acute distress  Cardiology: normal rate and rhythm, no murmur noted Respiratory: clear to auscultation bilaterally  Neurological examination  Mentation: Alert oriented to time, place, history taking. Follows all commands speech and language fluent Cranial nerve II-XII: Pupils were equal round reactive to light. Extraocular movements were full, visual field were full  Motor: The motor testing reveals 5 over 5 strength of all 4 extremities. Good symmetric motor tone is noted throughout.  Sensory: Sensory testing is intact to soft touch and pinprick on all 4 extremities. No evidence of extinction is noted.  Coordination: Cerebellar testing reveals good finger-nose-finger and heel-to-shin bilaterally.  Gait and station: Gait is normal.   DIAGNOSTIC DATA (LABS, IMAGING, TESTING) - I reviewed patient records, labs, notes, testing and imaging myself where available.  No flowsheet data found.   Lab Results  Component Value Date   WBC 6.7 09/22/2020   HGB 14.8 09/22/2020   HCT 45.1 09/22/2020   MCV 86.5 09/22/2020   PLT 174.0 09/22/2020      Component Value Date/Time   NA 136 09/22/2020 1424   K 4.0 09/22/2020 1424   CL 103 09/22/2020 1424   CO2 26 09/22/2020 1424   GLUCOSE 198 (H) 09/22/2020 1424   BUN 16 09/22/2020 1424   CREATININE 1.09 09/22/2020 1424   CREATININE 1.10 03/02/2018 1451   CALCIUM 9.4 09/22/2020 1424   PROT 7.1 09/22/2020 1424   ALBUMIN 4.7 09/22/2020 1424   AST 14 09/22/2020 1424   ALT 13 09/22/2020 1424   ALKPHOS 70 09/22/2020 1424   BILITOT 0.4 09/22/2020 1424    GFRNONAA >60 06/28/2019 1040   GFRAA >60 06/28/2019 1040   Lab Results  Component Value Date   CHOL 141 09/22/2020   HDL 56.30 09/22/2020   LDLCALC 60 09/22/2020   LDLDIRECT 46.0 10/08/2019   TRIG 124.0 09/22/2020   CHOLHDL 3 09/22/2020   Lab Results  Component Value Date   HGBA1C 7.2 (H) 09/22/2020   Lab Results  Component Value Date   VITAMINB12 >1526 (H) 09/22/2020   Lab Results  Component Value Date   TSH 1.02 07/08/2019     ASSESSMENT AND PLAN 55 y.o. year old male  has a past medical history of Allergy, Anxiety, Asthma, Chronic pain, Depression, Diabetes mellitus, Glaucoma, Neuromuscular disorder ([place]), and Paranoid schizophrenia ([place]). here with   No diagnosis found.   Mr Mcaulay continues to have intermittent upper and lower extremity numbness, however, has tried Lyrica and gabapentin with minimal relief. He was seen by ortho for concerns of  ulnar neuropathy. No surgical intervention recommended at this time. He continues close follow up with Dr Laurence Spates for [facility] of lumbar spine that helps. He is also seeing psychiatry and PCP regularly. He feels that mood and DM is well managed. He had updated labs today with PCP. He was advised to continue current treatment plan. We have discussed the importance of keeping blood sugars under good control to help prevent worsening of numbness. Regular exercise will help.  He will continue to monitor symptoms and follow up as needed.    No orders of the defined types were placed in this encounter.    No orders of the defined types were placed in this encounter.    Debbora Presto, FNP-C 02/04/2021, 10:19 AM [facility] [address], [place], [place] [address] [phone]

## 2021-02-04 NOTE — Telephone Encounter (Signed)
Patient called regarding scheduling a appointment patient stated that June 21,2022 at 2:30 pm will work best for him call [phone]

## 2021-02-04 NOTE — Telephone Encounter (Signed)
Pt called missed appt because had a upset stomach

## 2021-02-04 NOTE — Telephone Encounter (Signed)
Rescheduled for 6/21 at 1430 with driver.

## 2021-02-04 NOTE — Telephone Encounter (Signed)
See previous message

## 2021-02-04 NOTE — Telephone Encounter (Signed)
Patient will check with his driver and call me back with date and time.

## 2021-02-05 ENCOUNTER — Encounter: Payer: Self-pay | Admitting: Family Medicine

## 2021-02-08 ENCOUNTER — Telehealth: Payer: Self-pay | Admitting: Family Medicine

## 2021-02-08 ENCOUNTER — Telehealth (HOSPITAL_COMMUNITY): Payer: Medicare Other | Admitting: Psychiatry

## 2021-02-08 NOTE — Telephone Encounter (Signed)
Left message for patient to call back and schedule Medicare Annual Wellness Visit (AWV). Please scheduled appointment after 02/25/21.  Please offer to do virtually or by telephone.   Last AWV:02/26/2020  Please schedule at anytime with Nurse Health Advisor.

## 2021-02-11 ENCOUNTER — Other Ambulatory Visit: Payer: Self-pay | Admitting: Family Medicine

## 2021-02-16 ENCOUNTER — Ambulatory Visit (INDEPENDENT_AMBULATORY_CARE_PROVIDER_SITE_OTHER): Payer: Medicare Other | Admitting: Physical Medicine and Rehabilitation

## 2021-02-16 ENCOUNTER — Encounter: Payer: Self-pay | Admitting: Physical Medicine and Rehabilitation

## 2021-02-16 ENCOUNTER — Other Ambulatory Visit: Payer: Self-pay

## 2021-02-16 ENCOUNTER — Ambulatory Visit: Payer: Self-pay

## 2021-02-16 VITALS — BP 112/77 | HR 98

## 2021-02-16 DIAGNOSIS — M5116 Intervertebral disc disorders with radiculopathy, lumbar region: Secondary | ICD-10-CM

## 2021-02-16 MED ORDER — BETAMETHASONE SOD PHOS & ACET 6 (3-3) MG/ML IJ SUSP
12.0000 mg | Freq: Once | INTRAMUSCULAR | Status: AC
  Administered 2021-02-16: 12 mg

## 2021-02-16 NOTE — Progress Notes (Signed)
Pt state lower back pain that travels to both hips mostly his right hip. Pt state walking, sitting, standing and bending makes the pain worse. Pt state he lies in bed and takes over the counter pain meds to help ease his pain Pt has hx of inj on 06/01/20 pt state it helped.  Numeric Pain Rating Scale and Functional Assessment Average Pain 3   In the last MONTH (on 0-10 scale) has pain interfered with the following?  1. General activity like being  able to carry out your everyday physical activities such as walking, climbing stairs, carrying groceries, or moving a chair?  Rating(8)   +Driver, -BT, -Dye Allergies.

## 2021-02-16 NOTE — Patient Instructions (Signed)

## 2021-02-16 NOTE — Progress Notes (Signed)
Glenn Robertson - 55 y.o. male MRN [id number]  Date of birth: 08-11-1966  Office Visit Note: Visit Date: 02/16/2021 PCP: Libby Maw, MD Referred by: Libby Maw,*  Subjective: Chief Complaint  Patient presents with   Lower Back - Pain   Left Hip - Pain   Right Hip - Pain   HPI:  Glenn Robertson is a 55 y.o. male who comes in today at the request of Dr. Rodell Perna for planned Bilateral S1-2 Lumbar Transforaminal epidural steroid injection with fluoroscopic guidance.  The patient has failed conservative care including home exercise, medications, time and activity modification.  This injection will be diagnostic and hopefully therapeutic.  Please see requesting physician notes for further details and justification.  Patient has had new MRI of the lumbar spine since I last saw him.  The last time I saw him was in October of last year and completed S1 transforaminal injections with really good relief for quite some time.  He still actively tries to exercise and run.  He continues to have issues at the L5-S1 level with endplate edema degenerative changes as well as by foraminal narrowing and central to left paracentral disc protrusion broadly potentially irritating S1 nerve roots.   ROS Otherwise per HPI.  Assessment & Plan: Visit Diagnoses:    ICD-10-CM   1. Radiculopathy due to lumbar intervertebral disc disorder  M51.16 XR C-ARM NO REPORT    Epidural Steroid injection    betamethasone acetate-betamethasone sodium phosphate (CELESTONE) injection 12 mg      Plan: No additional findings.   Meds & Orders:  Meds ordered this encounter  Medications   betamethasone acetate-betamethasone sodium phosphate (CELESTONE) injection 12 mg    Orders Placed This Encounter  Procedures   XR C-ARM NO REPORT   Epidural Steroid injection    Follow-up: No follow-ups on file.   Procedures: No procedures performed  S1 Lumbosacral Transforaminal Epidural Steroid Injection -  Sub-Pedicular Approach with Fluoroscopic Guidance   Patient: Glenn Robertson      Date of Birth: [birth year] MRN: [id number] PCP: Libby Maw, MD      Visit Date: 02/16/2021   Universal Protocol:    Date/Time: 06/22/228:40 AM  Consent Given By: the patient  Position:  PRONE  Additional Comments: Vital signs were monitored before and after the procedure. Patient was prepped and draped in the usual sterile fashion. The correct patient, procedure, and site was verified.   Injection Procedure Details:  Procedure Site One Meds Administered:  Meds ordered this encounter  Medications   betamethasone acetate-betamethasone sodium phosphate (CELESTONE) injection 12 mg    Laterality: Bilateral  Location/Site:  S1 Foramen   Needle size: 22 ga.  Needle type: Spinal  Needle Placement: Transforaminal  Findings:   -Comments: Excellent flow of contrast along the nerve, nerve root and into the epidural space.  Epidurogram: Contrast epidurogram showed no nerve root cut off or restricted flow pattern.  Procedure Details: After squaring off the sacral end-plate to get a true AP view, the C-arm was positioned so that the best possible view of the S1 foramen was visualized. The soft tissues overlying this structure were infiltrated with 2-3 ml. of 1% Lidocaine without Epinephrine.    The spinal needle was inserted toward the target using a "trajectory" view along the fluoroscope beam.  Under AP and lateral visualization, the needle was advanced so it did not puncture dura. Biplanar projections were used to confirm position. Aspiration was confirmed to be negative for CSF and/or  blood. A 1-2 ml. volume of Isovue-250 was injected and flow of contrast was noted at each level. Radiographs were obtained for documentation purposes.   After attaining the desired flow of contrast documented above, a 0.5 to 1.0 ml test dose of 0.25% Marcaine was injected into each respective transforaminal  space.  The patient was observed for 90 seconds post injection.  After no sensory deficits were reported, and normal lower extremity motor function was noted,   the above injectate was administered so that equal amounts of the injectate were placed at each foramen (level) into the transforaminal epidural space.   Additional Comments:  The patient tolerated the procedure well Dressing: Band-Aid with 2 x 2 sterile gauze    Post-procedure details: Patient was observed during the procedure. Post-procedure instructions were reviewed.  Patient left the clinic in stable condition.   Clinical History: MRI LUMBAR SPINE WITHOUT CONTRAST   TECHNIQUE: Multiplanar, multisequence MR imaging of the lumbar spine was performed. No intravenous contrast was administered.   COMPARISON:  Previous radiograph from 11/11/2020 as well as prior MRI from 06/15/2019.   FINDINGS: Segmentation: Standard. Lowest well-formed disc space labeled the L5-S1 level.   Alignment: Physiologic with preservation of the normal lumbar lordosis. No listhesis.   Vertebrae: Vertebral body height maintained without acute or chronic fracture. Bone marrow signal intensity within normal limits. Probable small atypical hemangioma noted within the T12 vertebral body, stable. No other discrete or worrisome osseous lesions. Discogenic reactive endplate change with marrow edema present about the L5-S1 interspace. No other abnormal marrow edema.   Conus medullaris and cauda equina: Conus extends to the L1-2 level. Conus and cauda equina appear normal.   Paraspinal and other soft tissues: Paraspinous soft tissues within normal limits. Visualized visceral structures are unremarkable.   Disc levels:   L1-2:  Unremarkable.   L2-3: Mild degenerative intervertebral disc space narrowing with disc bulge and disc desiccation. Associated mild reactive endplate spurring. No spinal stenosis. No more than mild bilateral  foraminal narrowing. No impingement. Appearance is stable.   L3-4: Mild degenerative intervertebral disc space narrowing with diffuse disc bulge and disc desiccation. Minimal reactive endplate spurring. No spinal stenosis. Mild bilateral L3 foraminal narrowing without frank impingement. Appearance is stable.   L4-5: Minimal disc bulge, slightly eccentric to the left. Annular fissure at the level of the left neural foramen. No significant spinal stenosis. Mild bilateral L4 foraminal narrowing, slightly worse on the left. No frank impingement. Appearance is stable.   L5-S1: Advanced degenerative intervertebral disc space narrowing with disc desiccation and diffuse disc bulge. Associated reactive endplate change with marginal endplate osteophytic spurring and marrow edema. Superimposed broad-based central to left subarticular disc protrusion contacts the descending left S1 nerve root as it courses through the left lateral recess (series 5, image 39). No frank neural impingement. Mild epidural lipomatosis. No significant canal or lateral recess stenosis. Moderate bilateral L5 foraminal stenosis.   IMPRESSION: 1. Broad-based central 2 left subarticular disc protrusion at L5-S1, contacting and potentially irritating the descending left S1 nerve root. 2. Moderate bilateral L5 foraminal stenosis related to disc bulge, reactive endplate change, and marrow edema. 3. Discogenic reactive endplate change with marrow edema about the L5-S1 level, which could contribute to lower back pain. 4. Additional mild noncompressive disc bulging at L2-3 through L4-5 without significant stenosis or impingement.     Electronically Signed   By: Jeannine Boga M.D.   On: 01/08/2021 04:14     Objective:  VS:  HT:  WT:   BMI:     BP:112/77  HR:98bpm  TEMP: ( )  RESP:  Physical Exam Vitals and nursing note reviewed.  Constitutional:      General: He is not in acute distress.    Appearance:  Normal appearance. He is not ill-appearing.  HENT:     Head: Normocephalic and atraumatic.     Right Ear: External ear normal.     Left Ear: External ear normal.     Nose: No congestion.  Eyes:     Extraocular Movements: Extraocular movements intact.  Cardiovascular:     Rate and Rhythm: Normal rate.     Pulses: Normal pulses.  Pulmonary:     Effort: Pulmonary effort is normal. No respiratory distress.  Abdominal:     General: There is no distension.     Palpations: Abdomen is soft.  Musculoskeletal:        General: No tenderness or signs of injury.     Cervical back: Neck supple.     Right lower leg: No edema.     Left lower leg: No edema.     Comments: Patient has good distal strength without clonus.  Skin:    Findings: No erythema or rash.  Neurological:     General: No focal deficit present.     Mental Status: He is alert and oriented to person, place, and time.     Sensory: No sensory deficit.     Motor: No weakness or abnormal muscle tone.     Coordination: Coordination normal.  Psychiatric:        Mood and Affect: Mood normal.        Behavior: Behavior normal.     Imaging: XR C-ARM NO REPORT  Result Date: 02/16/2021 Please see Notes tab for imaging impression.

## 2021-02-17 ENCOUNTER — Ambulatory Visit (INDEPENDENT_AMBULATORY_CARE_PROVIDER_SITE_OTHER): Payer: Medicare Other | Admitting: Psychology

## 2021-02-17 ENCOUNTER — Telehealth: Payer: Self-pay | Admitting: Family Medicine

## 2021-02-17 DIAGNOSIS — F411 Generalized anxiety disorder: Secondary | ICD-10-CM

## 2021-02-17 DIAGNOSIS — F331 Major depressive disorder, recurrent, moderate: Secondary | ICD-10-CM | POA: Diagnosis not present

## 2021-02-17 DIAGNOSIS — F4310 Post-traumatic stress disorder, unspecified: Secondary | ICD-10-CM | POA: Diagnosis not present

## 2021-02-17 DIAGNOSIS — F200 Paranoid schizophrenia: Secondary | ICD-10-CM | POA: Diagnosis not present

## 2021-02-17 NOTE — Procedures (Signed)
S1 Lumbosacral Transforaminal Epidural Steroid Injection - Sub-Pedicular Approach with Fluoroscopic Guidance   Patient: Glenn Robertson      Date of Birth: [birth year] MRN: [id number] PCP: Libby Maw, MD      Visit Date: 02/16/2021   Universal Protocol:    Date/Time: 06/22/228:40 AM  Consent Given By: the patient  Position:  PRONE  Additional Comments: Vital signs were monitored before and after the procedure. Patient was prepped and draped in the usual sterile fashion. The correct patient, procedure, and site was verified.   Injection Procedure Details:  Procedure Site One Meds Administered:  Meds ordered this encounter  Medications   betamethasone acetate-betamethasone sodium phosphate (CELESTONE) injection 12 mg    Laterality: Bilateral  Location/Site:  S1 Foramen   Needle size: 22 ga.  Needle type: Spinal  Needle Placement: Transforaminal  Findings:   -Comments: Excellent flow of contrast along the nerve, nerve root and into the epidural space.  Epidurogram: Contrast epidurogram showed no nerve root cut off or restricted flow pattern.  Procedure Details: After squaring off the sacral end-plate to get a true AP view, the C-arm was positioned so that the best possible view of the S1 foramen was visualized. The soft tissues overlying this structure were infiltrated with 2-3 ml. of 1% Lidocaine without Epinephrine.    The spinal needle was inserted toward the target using a "trajectory" view along the fluoroscope beam.  Under AP and lateral visualization, the needle was advanced so it did not puncture dura. Biplanar projections were used to confirm position. Aspiration was confirmed to be negative for CSF and/or blood. A 1-2 ml. volume of Isovue-250 was injected and flow of contrast was noted at each level. Radiographs were obtained for documentation purposes.   After attaining the desired flow of contrast documented above, a 0.5 to 1.0 ml test dose of 0.25%  Marcaine was injected into each respective transforaminal space.  The patient was observed for 90 seconds post injection.  After no sensory deficits were reported, and normal lower extremity motor function was noted,   the above injectate was administered so that equal amounts of the injectate were placed at each foramen (level) into the transforaminal epidural space.   Additional Comments:  The patient tolerated the procedure well Dressing: Band-Aid with 2 x 2 sterile gauze    Post-procedure details: Patient was observed during the procedure. Post-procedure instructions were reviewed.  Patient left the clinic in stable condition.

## 2021-02-17 NOTE — Telephone Encounter (Signed)
Returned patients call, have resent forms to pharmacy patient aware.

## 2021-02-17 NOTE — Telephone Encounter (Signed)
Patient is requesting a call back from the nurse. Would not say what it is regarding. Please give him a call back at [phone].

## 2021-02-28 ENCOUNTER — Other Ambulatory Visit (HOSPITAL_COMMUNITY): Payer: Self-pay | Admitting: Psychiatry

## 2021-03-03 ENCOUNTER — Encounter: Payer: Self-pay | Admitting: Surgery

## 2021-03-03 ENCOUNTER — Ambulatory Visit (INDEPENDENT_AMBULATORY_CARE_PROVIDER_SITE_OTHER): Payer: Medicare Other | Admitting: Surgery

## 2021-03-03 ENCOUNTER — Telehealth: Payer: Self-pay

## 2021-03-03 ENCOUNTER — Ambulatory Visit: Payer: Self-pay

## 2021-03-03 VITALS — BP 108/74 | HR 86 | Ht 71.0 in | Wt 199.0 lb

## 2021-03-03 DIAGNOSIS — M25562 Pain in left knee: Secondary | ICD-10-CM | POA: Diagnosis not present

## 2021-03-03 DIAGNOSIS — M2242 Chondromalacia patellae, left knee: Secondary | ICD-10-CM

## 2021-03-03 NOTE — Telephone Encounter (Signed)
Talked with patient and advised him of message below.  Patient would like to know if there is a medicine he can take or a shot?  Please advise.  Thank you.

## 2021-03-03 NOTE — Telephone Encounter (Signed)
Talked with patient and advised him of previous message per Benjiman Core.

## 2021-03-03 NOTE — Progress Notes (Signed)
Office Visit Note   Patient: Glenn Robertson           Date of Birth: 06-24-1966           MRN: [id number] Visit Date: 03/03/2021              Requested by: Libby Maw, MD [address] [place],  [place] [address] PCP: Libby Maw, MD   Assessment & Plan: Visit Diagnoses:  1. Acute pain of left knee   2. Chondromalacia patellae, left knee     Plan: I advised patient that his exam is very benign today.  Recommended he can resume activity as tolerated but instructed to avoid activities that may aggravate his knee pain.  Advised patient that he should perhaps try stationary bike riding or using the elliptical machine and he advised him that with his mental health issues that he is not able to use stationary equipment.  I will have him follow-up with Dr. Lorin Mercy in 6 weeks for recheck.  If his knee continues to be problematic he may consider doing an intra-articular injection.  I did not think injection was indicated today.  Advised patient I think that he does have a inferior patellar spur that may be contribute to his intermittent symptoms.  Surgical intervention not indicated for what he has.  Follow-Up Instructions: Return in about 6 weeks (around 04/14/2021) for WITH DR YATES RECHECK LEFT KNEE.   Orders:  Orders Placed This Encounter  Procedures   XR KNEE 3 VIEW LEFT   No orders of the defined types were placed in this encounter.     Procedures: No procedures performed   Clinical Data: No additional findings.   Subjective: Chief Complaint  Patient presents with   Left Knee - Pain    HPI 55 year old black male comes in with complaints of left knee pain.  States had a fall a while back and has since had off-and-on pain in his knee.  No mechanical symptoms or feeling of instability.  No swelling.  Some soreness when he is going up and down stairs.  Has taken Mobic without any relief. Review of Systems No current cardiac pulmonary GI GU  issues  Objective: Vital Signs: BP 108/74   Pulse 86   Ht 5\' 11"  (1.803 m)   Wt 199 lb (90.3 kg)   BMI 27.75 kg/m   Physical Exam HENT:     Head: Normocephalic.  Eyes:     Extraocular Movements: Extraocular movements intact.  Pulmonary:     Effort: No respiratory distress.  Musculoskeletal:     Comments: Gait is normal.  Negative logroll bilateral hips.  Negative straight leg raise.  Left knee good range of motion.  Mildly positive patellar grind and crepitus.  Negative patellar apprehension.  Joint line nontender.  No swelling of the knee or palpable effusion.  Knee ligaments are stable.  Calf nontender.  Neurovascular intact.  Neurological:     General: No focal deficit present.     Mental Status: He is alert.    Ortho Exam  Specialty Comments:  No specialty comments available.  Imaging: No results found.   PMFS History: Patient Active Problem List   Diagnosis Date Noted   Allergic rhinitis 01/18/2021   Skin lesion of left ear 01/18/2021   Abrasion of left pinna 12/25/2020   Abrasion of left knee 12/25/2020   Tremor due to multiple drugs 10/02/2020   Seborrheic dermatitis 10/02/2020   Polyneuropathy 05/12/2020   Elevated cholesterol 10/08/2019  Benign prostatic hyperplasia with nocturia 10/08/2019   Schizoaffective disorder, depressive type ([place]) 11/27/2018   Urinary hesitancy 10/08/2018   Erectile dysfunction 09/24/2018   Arm numbness 08/20/2018   Transient alteration of awareness 08/08/2018   Tendinopathy of right gluteus medius 05/22/2018   Pain of right hip joint 05/08/2018   Hearing difficulty 04/13/2018   Trochanteric bursitis, right hip 03/23/2018   Reactive airway disease 03/16/2018   Headache 03/02/2018   Type 2 diabetes mellitus without complication, without long-term current use of insulin ([place]) 02/16/2018   Healthcare maintenance 02/16/2018   Pain in left leg 10/28/2016   Lumbosacral radiculopathy at S1 03/15/2016   Lumbar degenerative disc  disease 12/18/2015   Disc displacement, lumbar 04/21/2015   Chondromalacia of both patellae 02/24/2015   Paranoid schizophrenia, chronic condition ([place]) 02/24/2015   Right anterior knee pain 07/18/2014   Right ankle pain 01/27/2014   Pain in joint, ankle and foot 01/27/2014   Sciatica 01/10/2012   Disturbance of skin sensation 11/14/2011   Past Medical History:  Diagnosis Date   Allergy    dog and cats, citrus   Anxiety    Asthma    Chronic pain    Depression    Diabetes mellitus    Glaucoma    Neuromuscular disorder ([place])    Paranoid schizophrenia ([place])     Family History  Problem Relation Age of Onset   Diabetes Father    Diabetes Paternal Uncle    Colon cancer Neg Hx    Colon polyps Neg Hx    Esophageal cancer Neg Hx    Rectal cancer Neg Hx    Stomach cancer Neg Hx     Past Surgical History:  Procedure Laterality Date   ANKLE ARTHROSCOPY     Arm Surgery  1/12   rt ulnar nerve decompression   EPIDURAL BLOCK INJECTION     several   ULNAR NERVE TRANSPOSITION  01/19/2012   Procedure: ULNAR NERVE DECOMPRESSION/TRANSPOSITION;  Surgeon: Cammie Sickle., MD;  Location: [facility];  Service: Orthopedics;  Laterality: Left;  ulnar nerve decompression vs transposition left elbow   Social History   Occupational History   Occupation: Unemlpoyed-disabled  Tobacco Use   Smoking status: Never   Smokeless tobacco: Never  Vaping Use   Vaping Use: Never used  Substance and Sexual Activity   Alcohol use: No   Drug use: No   Sexual activity: Not on file

## 2021-03-04 ENCOUNTER — Telehealth: Payer: Self-pay | Admitting: Surgery

## 2021-03-04 ENCOUNTER — Other Ambulatory Visit: Payer: Self-pay | Admitting: Surgical

## 2021-03-04 MED ORDER — MELOXICAM 15 MG PO TABS: 15.0000 mg | ORAL_TABLET | Freq: Every day | ORAL | 3 refills | Status: DC

## 2021-03-04 NOTE — Telephone Encounter (Signed)
Sent in refill

## 2021-03-04 NOTE — Telephone Encounter (Signed)
Pt called requesting meloxicam for his arthritis. Please send to [facility] on [address]. Also pt wanted to notify PA Jeneen Rinks he has not tried to ride a bike yet but he will soon. Pt phone number is [phone]

## 2021-03-04 NOTE — Telephone Encounter (Signed)
IC advised.  

## 2021-03-10 ENCOUNTER — Telehealth: Payer: Self-pay

## 2021-03-10 NOTE — Telephone Encounter (Signed)
Talked with patient and advised him of message per Benjiman Core.  Patient voiced that he understands.

## 2021-03-19 ENCOUNTER — Telehealth: Payer: Self-pay

## 2021-03-19 NOTE — Telephone Encounter (Signed)
FYI

## 2021-03-19 NOTE — Telephone Encounter (Signed)
Pt called in stating that the meloxicam helped.  Pt stated that he can run a yard without it hurting but he needs to be running miles.

## 2021-03-22 ENCOUNTER — Encounter (HOSPITAL_COMMUNITY): Payer: Self-pay | Admitting: Psychiatry

## 2021-03-22 ENCOUNTER — Telehealth (INDEPENDENT_AMBULATORY_CARE_PROVIDER_SITE_OTHER): Payer: Medicare Other | Admitting: Psychiatry

## 2021-03-22 ENCOUNTER — Other Ambulatory Visit: Payer: Self-pay

## 2021-03-22 DIAGNOSIS — R404 Transient alteration of awareness: Secondary | ICD-10-CM

## 2021-03-22 DIAGNOSIS — F251 Schizoaffective disorder, depressive type: Secondary | ICD-10-CM | POA: Diagnosis not present

## 2021-03-22 MED ORDER — VRAYLAR 6 MG PO CAPS: ORAL_CAPSULE | ORAL | 1 refills | Status: DC

## 2021-03-22 MED ORDER — SERTRALINE HCL 100 MG PO TABS: 100.0000 mg | ORAL_TABLET | Freq: Every day | ORAL | 1 refills | Status: DC

## 2021-03-22 MED ORDER — FLUPHENAZINE HCL 5 MG PO TABS: ORAL_TABLET | ORAL | 1 refills | Status: DC

## 2021-03-22 MED ORDER — CLONAZEPAM 1 MG PO TABS: 1.0000 mg | ORAL_TABLET | Freq: Three times a day (TID) | ORAL | 1 refills | Status: DC | PRN

## 2021-03-22 MED ORDER — AMANTADINE HCL 100 MG PO CAPS: 100.0000 mg | ORAL_CAPSULE | Freq: Two times a day (BID) | ORAL | 1 refills | Status: DC

## 2021-03-22 NOTE — Progress Notes (Signed)
[facility] MD/PA/NP OP Progress Note  12/14/2020 Glenn Robertson  MRN:  [id number]   Chief Complaint:  Patient returns for medication management  This appointment was via telephone.  Patient's identity was verified using 2 separate identifiers.  Limitations associated with this type of visit have been reviewed. [facility] 20 minutes  HPI: 55 yo male, who has been diagnosed with schizoaffective disorder.  He was being followed by Dr . Montel Culver, who has left the clinic, and I am providing follow ups /bridging until new provider starts .  Currently reports he is doing "all right".   He describes his mood as "stable" and does not endorse worsening depression or any suicidal ideations at this time.  No significant neurovegetative symptoms. He describes ongoing intellectual interests and states he is learning [place] and also [place], states he would like to travel to [place] eventually. Denies hallucinations.  No overt paranoid ideations or delusions are expressed.  When asked states that recently there was a vehicle parked close to his house with the lights on which she felt was suspicious but states that "I left my mind after while".  He did not act out on this and currently denies history of violence or of acting out on paranoid/self referential ideations. He states that current medications "seem to be helping".  As noted he is currently on 2 antipsychotics (Vraylar and Prolixin).  We discussed simplifying medication regimen back to 1 antipsychotic but at this time he prefers not to , stating he is concerned that doing this might result in increased paranoia.  Denies any benzo misuse or abuse.  Currently on clonazepam PRN He denies medication side effects.  At this time is not endorsing akathisia or abnormal involuntary movements.      Visit Diagnosis:  No diagnosis found.  Past Psychiatric History: Please see intake H&P.  Past  Medical History:  Past Medical History:  Diagnosis Date   Allergy    dog and cats, citrus   Anxiety    Asthma    Chronic pain    Depression    Diabetes mellitus    Glaucoma    Neuromuscular disorder ([place])    Paranoid schizophrenia ([place])     Past Surgical History:  Procedure Laterality Date   ANKLE ARTHROSCOPY     Arm Surgery  1/12   rt ulnar nerve decompression   EPIDURAL BLOCK INJECTION     several   ULNAR NERVE TRANSPOSITION  01/19/2012   Procedure: ULNAR NERVE DECOMPRESSION/TRANSPOSITION;  Surgeon: Cammie Sickle., MD;  Location: [facility];  Service: Orthopedics;  Laterality: Left;  ulnar nerve decompression vs transposition left elbow    Family Psychiatric History: None.  Family History:  Family History  Problem Relation Age of Onset   Diabetes Father    Diabetes Paternal Uncle    Colon cancer Neg Hx    Colon polyps Neg Hx    Esophageal cancer Neg Hx    Rectal cancer Neg Hx    Stomach cancer Neg Hx     Social History:  Social History   Socioeconomic History   Marital status: Single    Spouse name: Not on file   Number of children: 0   Years of education: BA   Highest education level: Not on file  Occupational History   Occupation: Unemlpoyed-disabled  Tobacco Use   Smoking status: Never   Smokeless tobacco: Never  Vaping Use   Vaping Use: Never used  Substance  and Sexual Activity   Alcohol use: No   Drug use: No   Sexual activity: Not on file  Other Topics Concern   Not on file  Social History Narrative   Lives with mother   Caffeine use: Coke very rare   Right handed    Social Determinants of Health   Financial Resource Strain: Not on file  Food Insecurity: Not on file  Transportation Needs: Not on file  Physical Activity: Not on file  Stress: Not on file  Social Connections: Not on file    Allergies:  Allergies  Allergen Reactions   Citric Acid Other (See Comments)    Runny nose, eyes water   Food     Oranges  or anything with citric acid   Gabapentin     Walking into things, hard to maintain balance, falls    Metabolic Disorder Labs: Lab Results  Component Value Date   HGBA1C 7.2 (H) 09/22/2020   MPG 123 03/02/2018   No results found for: PROLACTIN Lab Results  Component Value Date   CHOL 141 09/22/2020   TRIG 124.0 09/22/2020   HDL 56.30 09/22/2020   CHOLHDL 3 09/22/2020   VLDL 24.8 09/22/2020   LDLCALC 60 09/22/2020   LDLCALC 41 04/06/2020   Lab Results  Component Value Date   TSH 1.02 07/08/2019   TSH 0.92 03/15/2018    Therapeutic Level Labs: No results found for: LITHIUM No results found for: VALPROATE No components found for:  CBMZ  Current Medications: Current Outpatient Medications  Medication Sig Dispense Refill   ACCU-CHEK SOFTCLIX LANCETS lancets 3 (three) times daily. for testing  0   albuterol (VENTOLIN HFA) 108 (90 Base) MCG/ACT inhaler Inhale 1-2 puffs into the lungs every 6 (six) hours as needed for wheezing or shortness of breath. 18 g 1   amantadine (SYMMETREL) 100 MG capsule Take 1 capsule (100 mg total) by mouth 2 (two) times daily. 60 capsule 1   Cariprazine HCl (VRAYLAR) 6 MG CAPS TAKE 1 CAPSULE(6 MG) BY MOUTH DAILY 30 capsule 1   clonazePAM (KLONOPIN) 1 MG tablet Take 1 tablet (1 mg total) by mouth 3 (three) times daily as needed for anxiety. for anxiety 90 tablet 1   clotrimazole-betamethasone (LOTRISONE) cream Apply 1 application topically 2 (two) times daily. 45 g 0   fluPHENAZine (PROLIXIN) 5 MG tablet TAKE 1 TABLET(5 MG) BY MOUTH AT BEDTIME 30 tablet 1   fluticasone (FLONASE) 50 MCG/ACT nasal spray SHAKE LIQUID AND USE 2 SPRAYS IN EACH NOSTRIL DAILY 16 g 6   glipiZIDE (GLUCOTROL) 10 MG tablet TAKE 1 TABLET(10 MG) BY MOUTH TWICE DAILY 180 tablet 3   glucose blood (ACCU-CHEK AVIVA PLUS) test strip 1 each by Other route daily. TEST DAILY 100 strip 0   JARDIANCE 25 MG TABS tablet TAKE 1 TABLET BY MOUTH DAILY 90 tablet 0   lamoTRIgine (LAMICTAL) 100 MG  tablet Take 1 tablet by mouth twice daily 180 tablet 0   LUMIGAN 0.01 % SOLN INSTILL 1 DROP INTO AFFECTED EYE ONCE AT BEDTIME     meloxicam (MOBIC) 15 MG tablet Take 1 tablet (15 mg total) by mouth daily. 30 tablet 3   metFORMIN (GLUCOPHAGE-XR) 500 MG 24 hr tablet TAKE 2 TABLETS(1000 MG) BY MOUTH TWICE DAILY 360 tablet 1   montelukast (SINGULAIR) 10 MG tablet TAKE 1 TABLET(10 MG) BY MOUTH AT BEDTIME 90 tablet 3   mupirocin ointment (BACTROBAN) 2 % Apply a thin coat to sores in left ear twice daily. 15  g 0   RESTASIS 0.05 % ophthalmic emulsion      sertraline (ZOLOFT) 100 MG tablet Take 1 tablet (100 mg total) by mouth daily. 30 tablet 1   simvastatin (ZOCOR) 20 MG tablet TAKE 1 TABLET(20 MG) BY MOUTH EVERY EVENING 90 tablet 3   tadalafil (CIALIS) 20 MG tablet TAKE ONE-HALF TO ONE TABLET BY MOUTH EVERY OTHER DAY AS NEEDED FOR ERECTILE DYSFUNCTION 10 tablet 4   triamcinolone (KENALOG) 0.1 % Apply 1 application topically 2 (two) times daily. 30 g 0   TYRVAYA 0.03 MG/ACT SOLN Spray 1 spray in each nostril twice daily, approximately 12 hours apart     No current facility-administered medications for this visit.       Psychiatric Specialty Exam: Please take into account limitations in obtaining a full mental status exam in the context of phone based communication Review of Systems  Psychiatric/Behavioral:  The patient is nervous/anxious.   All other systems reviewed and are negative.Does not endorse   There were no vitals taken for this visit.There is no height or weight on file to calculate BMI.  General Appearance: NA  Eye Contact: N/A  Speech:  Normal   Volume:  Normal  Mood:  Reports stable mood, denies feeling depressed.  Affect: Appears appropriate  Thought Process:  Goal Directed/ linear   Orientation:  Full (Time, Place, and Person)  Thought Content: Denies hallucinations.  No overt delusions expressed.  Does not seem focused on persecutory or self-referential ideations or  spontaneously describe.  When asked, states he recently saw a car parked with lights on near his home which he felt was suspicious, but states he did not ruminate about it and describes "it left my mind after a while".  Suicidal Thoughts:  No denies SI or self injurious ideations  Homicidal Thoughts:  No denies homicidal ideations   Memory: recent and remote grossly intact   Judgement:  Good  Insight:  Fair  Psychomotor Activity:  No restlessness or agitation, presents calm, comfortable , no akathisia noted   Concentration:  Good   Recall:  Good  Fund of Knowledge: Good  Language: Good  Akathisia:  Negative  Handed:  Right  AIMS (if indicated): does not report abnormal or involuntary movements, AIMS test unremarkable    Assets:  Communication Skills Desire for Improvement Financial Resources/Insurance Housing  ADL's:  Intact  Cognition: WNL  Sleep:  Fair   Screenings: [facility] Office Visit from 12/25/2020 in [facility] from 04/06/2020 in [facility] from 02/26/2020 in [facility] from 10/08/2019 in [facility] from [facility]  PHQ-2 Total Score '1 1 3 2 '$ 0  PHQ-9 Total Score -- -- 11 -- --        Assessment and Plan: 55 yo single AAM with schizophrenia paranoid type vs schizoaffective disorder.   Patient reports he is doing well.  He denies significant or worsening depression.  Denies SI/  does not endorse neurovegetative symptoms at this time.  Overall, paranoid/self-referential/persecutory ideations appear improved.  At this time patient is not endorsing hallucinations and is not expressing delusional ideations.  He is tolerating medications well.  He is on 2 antipsychotics (Vraylar and Fluphenazine).  Tolerating this regimen well thus far, denies side effects.  Currently prefers not to  change med regimen back to 1 antipsychotic as he feels this could result in increased symptoms/paranoia.  Side effects reviewed, to include risk of metabolic disturbances/akathisia/TD, and risk of sedation/habituation to clonazepam.   Noted patient is prescribed Mobic which he states he takes on a as needed basis.  Reviewed potential increased risk of GI bleed in combination with antidepressant.  Plan:  Continue Zoloft 100 mg  QDAY for depression, anxiety Continue Vraylar 6 mg for paranoia, psychosis Continue Klonopin 1 mg TID PRN for anxiety Continue Amantadine 100 mg BID to minimize tremors /EPS  Continue Fluphenazine 5 mg QHS for paranoia , psychosis  Side effects reviewed    Next appointment in approximately in 6-8 weeks . Agrees to contact clinic sooner if any worsening prior or medication concerns      Jenne Campus, MD 03/22/2021, 1:46 PM

## 2021-03-24 ENCOUNTER — Telehealth: Payer: Self-pay | Admitting: Orthopaedic Surgery

## 2021-03-24 NOTE — Telephone Encounter (Signed)
Patient called. Says he had to stop taking Mobic. Would like to know what he should do next. His call back number is [phone]

## 2021-03-24 NOTE — Telephone Encounter (Signed)
Please advise 

## 2021-03-25 ENCOUNTER — Other Ambulatory Visit (HOSPITAL_COMMUNITY): Payer: Self-pay | Admitting: Psychiatry

## 2021-03-26 ENCOUNTER — Ambulatory Visit (INDEPENDENT_AMBULATORY_CARE_PROVIDER_SITE_OTHER): Payer: Medicare Other | Admitting: Psychology

## 2021-03-26 ENCOUNTER — Other Ambulatory Visit: Payer: Self-pay | Admitting: Family Medicine

## 2021-03-26 DIAGNOSIS — F331 Major depressive disorder, recurrent, moderate: Secondary | ICD-10-CM | POA: Diagnosis not present

## 2021-03-26 DIAGNOSIS — F411 Generalized anxiety disorder: Secondary | ICD-10-CM

## 2021-03-26 DIAGNOSIS — F4310 Post-traumatic stress disorder, unspecified: Secondary | ICD-10-CM | POA: Diagnosis not present

## 2021-03-29 ENCOUNTER — Telehealth: Payer: Self-pay | Admitting: Orthopaedic Surgery

## 2021-03-29 NOTE — Telephone Encounter (Signed)
Please advise 

## 2021-03-29 NOTE — Telephone Encounter (Signed)
noted 

## 2021-03-29 NOTE — Telephone Encounter (Signed)
Pt called stating he has tried taking Advil but there's still a dull pain afterward. Pt also states he had x-rays done and would like a CB to discuss both matters.   [phone]

## 2021-03-31 ENCOUNTER — Other Ambulatory Visit: Payer: Self-pay | Admitting: Family Medicine

## 2021-03-31 DIAGNOSIS — E119 Type 2 diabetes mellitus without complications: Secondary | ICD-10-CM

## 2021-04-08 ENCOUNTER — Encounter: Payer: Self-pay | Admitting: Surgery

## 2021-04-08 ENCOUNTER — Other Ambulatory Visit: Payer: Self-pay

## 2021-04-08 ENCOUNTER — Ambulatory Visit (INDEPENDENT_AMBULATORY_CARE_PROVIDER_SITE_OTHER): Payer: Medicare Other | Admitting: Surgery

## 2021-04-08 VITALS — BP 125/83 | HR 97

## 2021-04-08 DIAGNOSIS — M25521 Pain in right elbow: Secondary | ICD-10-CM

## 2021-04-08 DIAGNOSIS — M25522 Pain in left elbow: Secondary | ICD-10-CM

## 2021-04-08 DIAGNOSIS — M5416 Radiculopathy, lumbar region: Secondary | ICD-10-CM

## 2021-04-08 NOTE — Progress Notes (Signed)
Office Visit Note   Patient: Glenn Robertson           Date of Birth: 25-Dec-1965           MRN: [id number] Visit Date: 04/08/2021              Requested by: Libby Maw, MD [address] [place],  [place] [address] PCP: Libby Maw, MD   Assessment & Plan: Visit Diagnoses:  1. Radiculopathy, lumbar region   2. Bilateral elbow joint pain     Plan: Since patient continues have ongoing lumbar spine issues and has failed conservative treatment with previous lumbar ESI I will have him follow-up with Dr. Lorin Mercy to discuss further treatment options which may or may not involve surgical intervention.  Advised patient that I think he has chronic issues with his elbows and we have seen him for this in the past.  I do not think imaging studies were indicated at this point since he did not have a specific injury.  His elbow exam is benign.  If his knee symptoms return he can also discuss this with Dr. Lorin Mercy at next office visit.  He may continue to use Mobic but I advised him that he absolutely cannot use Mobic and Advil together.  Follow-Up Instructions: Return in about 13 days (around 04/21/2021) for With Dr. Lorin Mercy as scheduled.   Orders:  No orders of the defined types were placed in this encounter.  No orders of the defined types were placed in this encounter.     Procedures: No procedures performed   Clinical Data: No additional findings.   Subjective: Chief Complaint  Patient presents with   Left Knee - Follow-up    HPI 55 year old black male returns for recheck of his left knee pain.  When I last seen patient here in the clinic I had wanted him to follow-up with Dr. Inda Merlin and appointment was scheduled for August 17 but for some reason patient canceled that appointment.  He currently states that his knee is doing fine.  Has ongoing issues with his low back pain and says that the lumbar ESI did not help.  He states that he has had intermittent soreness in  his left elbow over the last couple days.  No specific injury.  States that my elbow is "sore to the touch".  Denies any swelling or bruising.  He continues to use his left upper extremity without any issues. Review of Systems No current cardiac pulmonary GI GU issues  Objective: Vital Signs: BP 125/83 (BP Location: Left Arm, Patient Position: Sitting)   Pulse 97   SpO2 96%   Physical Exam HENT:     Head: Normocephalic.  Eyes:     Extraocular Movements: Extraocular movements intact.  Musculoskeletal:     Comments: Exam bilateral elbows unremarkable.  He has minimal soreness around the left bursa.  No olecranon bursal swelling.  No redness.  No bruising.  No signs infection.  Good full left elbow range of motion; no pain with forearm supination and pronation.  Good upper extremity strength.  No pain with resistance.  Neurological:     General: No focal deficit present.    Ortho Exam  Specialty Comments:  No specialty comments available.  Imaging: No results found.   PMFS History: Patient Active Problem List   Diagnosis Date Noted   Allergic rhinitis 01/18/2021   Skin lesion of left ear 01/18/2021   Abrasion of left pinna 12/25/2020   Abrasion of left  knee 12/25/2020   Tremor due to multiple drugs 10/02/2020   Seborrheic dermatitis 10/02/2020   Polyneuropathy 05/12/2020   Elevated cholesterol 10/08/2019   Benign prostatic hyperplasia with nocturia 10/08/2019   Schizoaffective disorder, depressive type ([place]) 11/27/2018   Urinary hesitancy 10/08/2018   Erectile dysfunction 09/24/2018   Arm numbness 08/20/2018   Transient alteration of awareness 08/08/2018   Tendinopathy of right gluteus medius 05/22/2018   Pain of right hip joint 05/08/2018   Hearing difficulty 04/13/2018   Trochanteric bursitis, right hip 03/23/2018   Reactive airway disease 03/16/2018   Headache 03/02/2018   Type 2 diabetes mellitus without complication, without long-term current use of insulin ([place])  02/16/2018   Healthcare maintenance 02/16/2018   Pain in left leg 10/28/2016   Lumbosacral radiculopathy at S1 03/15/2016   Lumbar degenerative disc disease 12/18/2015   Disc displacement, lumbar 04/21/2015   Chondromalacia of both patellae 02/24/2015   Paranoid schizophrenia, chronic condition ([facility]) 02/24/2015   Right anterior knee pain 07/18/2014   Right ankle pain 01/27/2014   Pain in joint, ankle and foot 01/27/2014   Sciatica 01/10/2012   Disturbance of skin sensation 11/14/2011   Past Medical History:  Diagnosis Date   Allergy    dog and cats, citrus   Anxiety    Asthma    Chronic pain    Depression    Diabetes mellitus    Glaucoma    Neuromuscular disorder ([place])    Paranoid schizophrenia ([place])     Family History  Problem Relation Age of Onset   Diabetes Father    Diabetes Paternal Uncle    Colon cancer Neg Hx    Colon polyps Neg Hx    Esophageal cancer Neg Hx    Rectal cancer Neg Hx    Stomach cancer Neg Hx     Past Surgical History:  Procedure Laterality Date   ANKLE ARTHROSCOPY     Arm Surgery  1/12   rt ulnar nerve decompression   EPIDURAL BLOCK INJECTION     several   ULNAR NERVE TRANSPOSITION  01/19/2012   Procedure: ULNAR NERVE DECOMPRESSION/TRANSPOSITION;  Surgeon: Cammie Sickle., MD;  Location: [facility];  Service: Orthopedics;  Laterality: Left;  ulnar nerve decompression vs transposition left elbow   Social History   Occupational History   Occupation: Unemlpoyed-disabled  Tobacco Use   Smoking status: Never   Smokeless tobacco: Never  Vaping Use   Vaping Use: Never used  Substance and Sexual Activity   Alcohol use: No   Drug use: No   Sexual activity: Not on file

## 2021-04-14 ENCOUNTER — Ambulatory Visit: Payer: Medicare Other | Admitting: Orthopaedic Surgery

## 2021-04-21 ENCOUNTER — Ambulatory Visit: Payer: Medicare Other | Admitting: Orthopaedic Surgery

## 2021-04-26 ENCOUNTER — Telehealth: Payer: Self-pay | Admitting: Physical Medicine and Rehabilitation

## 2021-04-26 NOTE — Telephone Encounter (Signed)
Bilateral S1 TF on 02/16/21. Ok to repeat if helped, same problem/side, and no new injury?

## 2021-04-26 NOTE — Telephone Encounter (Signed)
Pt called requesting an appt for back injections. Please call pt about this matter at [phone].

## 2021-04-27 ENCOUNTER — Telehealth: Payer: Self-pay | Admitting: Physical Medicine and Rehabilitation

## 2021-04-27 NOTE — Telephone Encounter (Signed)
Left message #1

## 2021-04-27 NOTE — Telephone Encounter (Signed)
Pt returned call to [place]. Please call pt at [phone].

## 2021-04-28 ENCOUNTER — Ambulatory Visit (INDEPENDENT_AMBULATORY_CARE_PROVIDER_SITE_OTHER): Payer: Medicare Other | Admitting: Orthopaedic Surgery

## 2021-04-28 ENCOUNTER — Other Ambulatory Visit: Payer: Self-pay

## 2021-04-28 ENCOUNTER — Encounter: Payer: Self-pay | Admitting: Family Medicine

## 2021-04-28 ENCOUNTER — Encounter: Payer: Self-pay | Admitting: Orthopaedic Surgery

## 2021-04-28 VITALS — BP 122/83 | HR 80

## 2021-04-28 DIAGNOSIS — M5136 Other intervertebral disc degeneration, lumbar region: Secondary | ICD-10-CM | POA: Diagnosis not present

## 2021-04-28 DIAGNOSIS — M51369 Other intervertebral disc degeneration, lumbar region without mention of lumbar back pain or lower extremity pain: Secondary | ICD-10-CM

## 2021-04-28 DIAGNOSIS — D485 Neoplasm of uncertain behavior of skin: Secondary | ICD-10-CM | POA: Diagnosis not present

## 2021-04-28 NOTE — Telephone Encounter (Signed)
Scheduled for 9/14 at 1530.

## 2021-04-28 NOTE — Progress Notes (Signed)
Office Visit Note   Patient: Glenn Robertson           Date of Birth: [birth year]           MRN: [id number] Visit Date: 04/28/2021              Requested by: Libby Maw, MD [address] [place],  [place] [address] PCP: Libby Maw, MD   Assessment & Plan: Visit Diagnoses:  1. Lumbar degenerative disc disease     Plan: Patient does have foraminal stenosis but he states he is able to run is still active.  Certain types of workouts he likes to do give him a problem and again he is concerned about this.  No motor weakness related to help L5 foraminal stenosis.  Minimal nerve root tension signs.  Does have type 2 diabetes.  Would recommend continued exercise and cardiovascular with things it does not seem to aggravate his back currently states he is able to run which is unusual for severe foraminal stenosis.  Can follow-up with him on a as needed basis.  Follow-Up Instructions: Return if symptoms worsen or fail to improve.   Orders:  No orders of the defined types were placed in this encounter.  No orders of the defined types were placed in this encounter.     Procedures: No procedures performed   Clinical Data: No additional findings.   Subjective: Chief Complaint  Patient presents with   Lower Back - Follow-up    HPI 55 year old male returns with ongoing problems with back pain.  He states he is able to run but certain activities such as bike riding bothers him.  He has been using Mobic.  Epidural 02/16/2021 did not help.  He had previous epidurals 2021 and also previous years.  Currently he is learning [place].  He tries to workout frequently.  He has bilateral L5 foraminal stenosis with disc bulge endplate changes and marrow edema.  Discogenic reactive endplate changes which have been persistent at L5-S1.  Minimal bulges at other levels no compression.  Review of Systems post treatment for paranoid schizophrenia.  Positive for lumbar foraminal  stenosis all other systems updated noncontributory to HPI.   Objective: Vital Signs: BP 122/83 (BP Location: Left Arm, Patient Position: Sitting, Cuff Size: Normal)   Pulse 80   SpO2 96%   Physical Exam Constitutional:      Appearance: He is well-developed.  HENT:     Head: Normocephalic and atraumatic.     Right Ear: External ear normal.     Left Ear: External ear normal.  Eyes:     Pupils: Pupils are equal, round, and reactive to light.  Neck:     Thyroid: No thyromegaly.     Trachea: No tracheal deviation.  Cardiovascular:     Rate and Rhythm: Normal rate.  Pulmonary:     Effort: Pulmonary effort is normal.     Breath sounds: No wheezing.  Abdominal:     General: Bowel sounds are normal.     Palpations: Abdomen is soft.  Musculoskeletal:     Cervical back: Neck supple.  Skin:    General: Skin is warm and dry.     Capillary Refill: Capillary refill takes less than 2 seconds.  Neurological:     Mental Status: He is alert and oriented to person, place, and time.  Psychiatric:        Behavior: Behavior normal.        Thought Content: Thought content normal.  Judgment: Judgment normal.    Ortho Exam patient can get from sitting standing no sciatic notch tenderness.  Some trochanteric bursal tenderness.  Negative logroll to the hips knees reach full extension.  Normal heel toe gait.  Specialty Comments:  No specialty comments available.  Imaging: No results found.   PMFS History: Patient Active Problem List   Diagnosis Date Noted   Allergic rhinitis 01/18/2021   Skin lesion of left ear 01/18/2021   Abrasion of left pinna 12/25/2020   Abrasion of left knee 12/25/2020   Tremor due to multiple drugs 10/02/2020   Seborrheic dermatitis 10/02/2020   Polyneuropathy 05/12/2020   Elevated cholesterol 10/08/2019   Benign prostatic hyperplasia with nocturia 10/08/2019   Schizoaffective disorder, depressive type ([place]) 11/27/2018   Urinary hesitancy 10/08/2018    Erectile dysfunction 09/24/2018   Arm numbness 08/20/2018   Transient alteration of awareness 08/08/2018   Tendinopathy of right gluteus medius 05/22/2018   Pain of right hip joint 05/08/2018   Hearing difficulty 04/13/2018   Trochanteric bursitis, right hip 03/23/2018   Reactive airway disease 03/16/2018   Headache 03/02/2018   Type 2 diabetes mellitus without complication, without long-term current use of insulin ([facility]) 02/16/2018   Healthcare maintenance 02/16/2018   Pain in left leg 10/28/2016   Lumbosacral radiculopathy at S1 03/15/2016   Lumbar degenerative disc disease 12/18/2015   Disc displacement, lumbar 04/21/2015   Chondromalacia of both patellae 02/24/2015   Paranoid schizophrenia, chronic condition ([facility]) 02/24/2015   Right anterior knee pain 07/18/2014   Right ankle pain 01/27/2014   Pain in joint, ankle and foot 01/27/2014   Sciatica 01/10/2012   Disturbance of skin sensation 11/14/2011   Past Medical History:  Diagnosis Date   Allergy    dog and cats, citrus   Anxiety    Asthma    Chronic pain    Depression    Diabetes mellitus    Glaucoma    Neuromuscular disorder ([facility])    Paranoid schizophrenia ([facility])     Family History  Problem Relation Age of Onset   Diabetes Father    Diabetes Paternal Uncle    Colon cancer Neg Hx    Colon polyps Neg Hx    Esophageal cancer Neg Hx    Rectal cancer Neg Hx    Stomach cancer Neg Hx     Past Surgical History:  Procedure Laterality Date   ANKLE ARTHROSCOPY     Arm Surgery  1/12   rt ulnar nerve decompression   EPIDURAL BLOCK INJECTION     several   ULNAR NERVE TRANSPOSITION  01/19/2012   Procedure: ULNAR NERVE DECOMPRESSION/TRANSPOSITION;  Surgeon: Cammie Sickle., MD;  Location: [facility];  Service: Orthopedics;  Laterality: Left;  ulnar nerve decompression vs transposition left elbow   Social History   Occupational History   Occupation: Unemlpoyed-disabled  Tobacco Use   Smoking status:  Never   Smokeless tobacco: Never  Vaping Use   Vaping Use: Never used  Substance and Sexual Activity   Alcohol use: No   Drug use: No   Sexual activity: Not on file

## 2021-04-28 NOTE — Telephone Encounter (Signed)
See previous message

## 2021-04-29 ENCOUNTER — Other Ambulatory Visit (HOSPITAL_COMMUNITY): Payer: Self-pay | Admitting: Psychiatry

## 2021-05-04 ENCOUNTER — Telehealth: Payer: Self-pay | Admitting: Physical Medicine and Rehabilitation

## 2021-05-04 NOTE — Telephone Encounter (Signed)
Pt called to cancel appt. Please call pt at [phone] to confirmation cancellation.

## 2021-05-05 NOTE — Telephone Encounter (Signed)
Appointment canceled- confirmed with patient.

## 2021-05-11 ENCOUNTER — Ambulatory Visit (INDEPENDENT_AMBULATORY_CARE_PROVIDER_SITE_OTHER): Payer: Medicare Other | Admitting: Psychology

## 2021-05-11 DIAGNOSIS — F331 Major depressive disorder, recurrent, moderate: Secondary | ICD-10-CM | POA: Diagnosis not present

## 2021-05-11 DIAGNOSIS — F4310 Post-traumatic stress disorder, unspecified: Secondary | ICD-10-CM | POA: Diagnosis not present

## 2021-05-11 DIAGNOSIS — F200 Paranoid schizophrenia: Secondary | ICD-10-CM

## 2021-05-11 DIAGNOSIS — F411 Generalized anxiety disorder: Secondary | ICD-10-CM

## 2021-05-12 ENCOUNTER — Ambulatory Visit: Payer: Medicare Other | Admitting: Physical Medicine and Rehabilitation

## 2021-05-12 ENCOUNTER — Encounter: Payer: Self-pay | Admitting: Orthopaedic Surgery

## 2021-05-12 ENCOUNTER — Other Ambulatory Visit: Payer: Self-pay | Admitting: Family Medicine

## 2021-05-12 DIAGNOSIS — M5136 Other intervertebral disc degeneration, lumbar region: Secondary | ICD-10-CM

## 2021-05-12 DIAGNOSIS — M51369 Other intervertebral disc degeneration, lumbar region without mention of lumbar back pain or lower extremity pain: Secondary | ICD-10-CM

## 2021-05-12 NOTE — Telephone Encounter (Signed)
Please advise 

## 2021-05-13 ENCOUNTER — Telehealth: Payer: Self-pay | Admitting: Physical Medicine and Rehabilitation

## 2021-05-13 NOTE — Telephone Encounter (Signed)
Patient called. He would like an appointment with Dr. Ernestina Patches. His call back number is [phone]

## 2021-05-14 NOTE — Telephone Encounter (Signed)
Rescheduled injection appointment.

## 2021-05-17 ENCOUNTER — Other Ambulatory Visit: Payer: Self-pay

## 2021-05-17 ENCOUNTER — Encounter (HOSPITAL_COMMUNITY): Payer: Self-pay | Admitting: Psychiatry

## 2021-05-17 ENCOUNTER — Telehealth (INDEPENDENT_AMBULATORY_CARE_PROVIDER_SITE_OTHER): Payer: Medicare Other | Admitting: Psychiatry

## 2021-05-17 DIAGNOSIS — F251 Schizoaffective disorder, depressive type: Secondary | ICD-10-CM

## 2021-05-17 DIAGNOSIS — R404 Transient alteration of awareness: Secondary | ICD-10-CM | POA: Diagnosis not present

## 2021-05-17 MED ORDER — AMANTADINE HCL 100 MG PO CAPS: 100.0000 mg | ORAL_CAPSULE | Freq: Two times a day (BID) | ORAL | 1 refills | Status: DC

## 2021-05-17 MED ORDER — FLUPHENAZINE HCL 5 MG PO TABS: ORAL_TABLET | ORAL | 1 refills | Status: DC

## 2021-05-17 MED ORDER — VRAYLAR 6 MG PO CAPS: ORAL_CAPSULE | ORAL | 1 refills | Status: DC

## 2021-05-17 MED ORDER — SERTRALINE HCL 100 MG PO TABS: 100.0000 mg | ORAL_TABLET | Freq: Every day | ORAL | 1 refills | Status: DC

## 2021-05-17 MED ORDER — CLONAZEPAM 1 MG PO TABS: 1.0000 mg | ORAL_TABLET | Freq: Three times a day (TID) | ORAL | 1 refills | Status: DC | PRN
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
## 2021-05-18 ENCOUNTER — Telehealth: Payer: Self-pay | Admitting: Physical Medicine and Rehabilitation

## 2021-05-18 ENCOUNTER — Other Ambulatory Visit (HOSPITAL_COMMUNITY): Payer: Self-pay | Admitting: *Deleted

## 2021-05-18 DIAGNOSIS — F200 Paranoid schizophrenia: Secondary | ICD-10-CM

## 2021-05-18 DIAGNOSIS — Z79899 Other long term (current) drug therapy: Secondary | ICD-10-CM

## 2021-05-18 NOTE — Telephone Encounter (Signed)
Called patient and rescheduled appointment

## 2021-05-18 NOTE — Telephone Encounter (Signed)
Pt wants to know can he come in the 29th?  [place]

## 2021-05-19 ENCOUNTER — Telehealth (HOSPITAL_COMMUNITY): Payer: Self-pay

## 2021-05-19 NOTE — Telephone Encounter (Signed)
Patient called requesting an Excuse from [employer] letter for 06/09/21. Can we do the letter? Please review and advise. Thank you

## 2021-05-23 DIAGNOSIS — Z23 Encounter for immunization: Secondary | ICD-10-CM | POA: Diagnosis not present

## 2021-05-25 ENCOUNTER — Ambulatory Visit: Payer: Medicare Other | Admitting: Physical Medicine and Rehabilitation

## 2021-05-25 DIAGNOSIS — H04123 Dry eye syndrome of bilateral lacrimal glands: Secondary | ICD-10-CM | POA: Diagnosis not present

## 2021-05-25 DIAGNOSIS — H401232 Low-tension glaucoma, bilateral, moderate stage: Secondary | ICD-10-CM | POA: Diagnosis not present

## 2021-05-25 DIAGNOSIS — H43813 Vitreous degeneration, bilateral: Secondary | ICD-10-CM | POA: Diagnosis not present

## 2021-05-26 ENCOUNTER — Encounter (HOSPITAL_COMMUNITY): Payer: Self-pay

## 2021-05-27 ENCOUNTER — Ambulatory Visit (INDEPENDENT_AMBULATORY_CARE_PROVIDER_SITE_OTHER): Payer: Medicare Other | Admitting: Family Medicine

## 2021-05-27 ENCOUNTER — Encounter: Payer: Self-pay | Admitting: Physical Medicine and Rehabilitation

## 2021-05-27 ENCOUNTER — Ambulatory Visit: Payer: Self-pay

## 2021-05-27 ENCOUNTER — Other Ambulatory Visit: Payer: Self-pay

## 2021-05-27 ENCOUNTER — Encounter: Payer: Self-pay | Admitting: Family Medicine

## 2021-05-27 ENCOUNTER — Ambulatory Visit (INDEPENDENT_AMBULATORY_CARE_PROVIDER_SITE_OTHER): Payer: Medicare Other | Admitting: Physical Medicine and Rehabilitation

## 2021-05-27 VITALS — BP 124/97 | HR 97

## 2021-05-27 VITALS — BP 105/70 | HR 92 | Ht 71.0 in | Wt 198.5 lb

## 2021-05-27 DIAGNOSIS — M5417 Radiculopathy, lumbosacral region: Secondary | ICD-10-CM | POA: Diagnosis not present

## 2021-05-27 DIAGNOSIS — M5116 Intervertebral disc disorders with radiculopathy, lumbar region: Secondary | ICD-10-CM

## 2021-05-27 MED ORDER — BETAMETHASONE SOD PHOS & ACET 6 (3-3) MG/ML IJ SUSP
12.0000 mg | Freq: Once | INTRAMUSCULAR | Status: AC
  Administered 2021-05-27: 12 mg

## 2021-05-27 MED ORDER — LAMOTRIGINE 100 MG PO TABS: ORAL_TABLET | ORAL | 3 refills | Status: DC

## 2021-05-27 NOTE — Progress Notes (Signed)
Glenn Robertson - 55 y.o. male MRN [id number]  Date of birth: 1966-04-24  Office Visit Note: Visit Date: 05/27/2021 PCP: Libby Maw, MD Referred by: Libby Maw,*  Subjective: Chief Complaint  Patient presents with   Lower Back - Pain   Right Thigh - Pain   Left Leg - Pain   HPI:  Glenn Robertson is a 55 y.o. male who comes in today at the request of Dr. Rodell Perna for planned Bilateral S1-2 Lumbar Transforaminal epidural steroid injection with fluoroscopic guidance.  The patient has failed conservative care including home exercise, medications, time and activity modification.  This injection will be diagnostic and hopefully therapeutic.  Please see requesting physician notes for further details and justification.  He did get some relief with the last injection he just was not as good as normal for him.  His symptoms are still very consistent posterior leg pain.  He is doing a lot of bicycle riding at this point and will ride up to 16 miles on the [place] with biking that he says is very strenuous.  In the past he is done a lot of running.  I do think with his disc issue at L5-S1 sitting on a bicycle for that length of time is probably exacerbating his symptoms and I tried to express that to him.  I think he continue to ride his bike with intermittent breaks but I do think that is probably causing some of the flareup as did the running.  He needs to stay active with core strengthening and that would be his best chance at long-term pain relief for this.  He has had multiple rounds of therapy and he does continue to try to exercise but is very much cardiovascular oriented.  He will continue to follow-up with Dr. Lorin Mercy from a spine surgery standpoint.  ROS Otherwise per HPI.  Assessment & Plan: Visit Diagnoses:    ICD-10-CM   1. Radiculopathy due to lumbar intervertebral disc disorder  M51.16 XR C-ARM NO REPORT    Epidural Steroid injection    betamethasone  acetate-betamethasone sodium phosphate (CELESTONE) injection 12 mg      Plan: No additional findings.   Meds & Orders:  Meds ordered this encounter  Medications   betamethasone acetate-betamethasone sodium phosphate (CELESTONE) injection 12 mg    Orders Placed This Encounter  Procedures   XR C-ARM NO REPORT   Epidural Steroid injection    Follow-up: Return if symptoms worsen or fail to improve.   Procedures: No procedures performed  S1 Lumbosacral Transforaminal Epidural Steroid Injection - Sub-Pedicular Approach with Fluoroscopic Guidance   Patient: Glenn Robertson      Date of Birth: [birth year] MRN: [id number] PCP: Libby Maw, MD      Visit Date: 05/27/2021   Universal Protocol:    Date/Time: 09/30/226:47 PM  Consent Given By: the patient  Position:  PRONE  Additional Comments: Vital signs were monitored before and after the procedure. Patient was prepped and draped in the usual sterile fashion. The correct patient, procedure, and site was verified.   Injection Procedure Details:  Procedure Site One Meds Administered:  Meds ordered this encounter  Medications   betamethasone acetate-betamethasone sodium phosphate (CELESTONE) injection 12 mg    Laterality: Bilateral  Location/Site:  S1 Foramen   Needle size: 22 ga.  Needle type: Spinal  Needle Placement: Transforaminal  Findings:   -Comments: Excellent flow of contrast along the nerve, nerve root and into the epidural space.  Initial flow of  contrast on the right was venous flow despite different rotations and ventral and dorsal movement.  Ultimately we were able to just restart that side from a different angle and did get good flow.  There does seem to be on the right some level of cut off of the flow indicating some narrowing.    Procedure Details: After squaring off the sacral end-plate to get a true AP view, the C-arm was positioned so that the best possible view of the S1 foramen was  visualized. The soft tissues overlying this structure were infiltrated with 2-3 ml. of 1% Lidocaine without Epinephrine.    The spinal needle was inserted toward the target using a "trajectory" view along the fluoroscope beam.  Under AP and lateral visualization, the needle was advanced so it did not puncture dura. Biplanar projections were used to confirm position. Aspiration was confirmed to be negative for CSF and/or blood. A 1-2 ml. volume of Isovue-250 was injected and flow of contrast was noted at each level. Radiographs were obtained for documentation purposes.   After attaining the desired flow of contrast documented above, a 0.5 to 1.0 ml test dose of 0.25% Marcaine was injected into each respective transforaminal space.  The patient was observed for 90 seconds post injection.  After no sensory deficits were reported, and normal lower extremity motor function was noted,   the above injectate was administered so that equal amounts of the injectate were placed at each foramen (level) into the transforaminal epidural space.   Additional Comments:  The patient tolerated the procedure well Dressing: Band-Aid with 2 x 2 sterile gauze    Post-procedure details: Patient was observed during the procedure. Post-procedure instructions were reviewed.  Patient left the clinic in stable condition.   Clinical History: MRI LUMBAR SPINE WITHOUT CONTRAST   TECHNIQUE: Multiplanar, multisequence MR imaging of the lumbar spine was performed. No intravenous contrast was administered.   COMPARISON:  Previous radiograph from 11/11/2020 as well as prior MRI from 06/15/2019.   FINDINGS: Segmentation: Standard. Lowest well-formed disc space labeled the L5-S1 level.   Alignment: Physiologic with preservation of the normal lumbar lordosis. No listhesis.   Vertebrae: Vertebral body height maintained without acute or chronic fracture. Bone marrow signal intensity within normal limits. Probable small  atypical hemangioma noted within the T12 vertebral body, stable. No other discrete or worrisome osseous lesions. Discogenic reactive endplate change with marrow edema present about the L5-S1 interspace. No other abnormal marrow edema.   Conus medullaris and cauda equina: Conus extends to the L1-2 level. Conus and cauda equina appear normal.   Paraspinal and other soft tissues: Paraspinous soft tissues within normal limits. Visualized visceral structures are unremarkable.   Disc levels:   L1-2:  Unremarkable.   L2-3: Mild degenerative intervertebral disc space narrowing with disc bulge and disc desiccation. Associated mild reactive endplate spurring. No spinal stenosis. No more than mild bilateral foraminal narrowing. No impingement. Appearance is stable.   L3-4: Mild degenerative intervertebral disc space narrowing with diffuse disc bulge and disc desiccation. Minimal reactive endplate spurring. No spinal stenosis. Mild bilateral L3 foraminal narrowing without frank impingement. Appearance is stable.   L4-5: Minimal disc bulge, slightly eccentric to the left. Annular fissure at the level of the left neural foramen. No significant spinal stenosis. Mild bilateral L4 foraminal narrowing, slightly worse on the left. No frank impingement. Appearance is stable.   L5-S1: Advanced degenerative intervertebral disc space narrowing with disc desiccation and diffuse disc bulge. Associated reactive endplate change with marginal  endplate osteophytic spurring and marrow edema. Superimposed broad-based central to left subarticular disc protrusion contacts the descending left S1 nerve root as it courses through the left lateral recess (series 5, image 39). No frank neural impingement. Mild epidural lipomatosis. No significant canal or lateral recess stenosis. Moderate bilateral L5 foraminal stenosis.   IMPRESSION: 1. Broad-based central 2 left subarticular disc protrusion at  L5-S1, contacting and potentially irritating the descending left S1 nerve root. 2. Moderate bilateral L5 foraminal stenosis related to disc bulge, reactive endplate change, and marrow edema. 3. Discogenic reactive endplate change with marrow edema about the L5-S1 level, which could contribute to lower back pain. 4. Additional mild noncompressive disc bulging at L2-3 through L4-5 without significant stenosis or impingement.     Electronically Signed   By: Jeannine Boga M.D.   On: 01/08/2021 04:14     Objective:  VS:  HT:    WT:   BMI:     BP:(!) 124/97  HR:97bpm  TEMP: ( )  RESP:  Physical Exam Vitals and nursing note reviewed.  Constitutional:      General: He is not in acute distress.    Appearance: Normal appearance. He is not ill-appearing.  HENT:     Head: Normocephalic and atraumatic.     Right Ear: External ear normal.     Left Ear: External ear normal.     Nose: No congestion.  Eyes:     Extraocular Movements: Extraocular movements intact.  Cardiovascular:     Rate and Rhythm: Normal rate.     Pulses: Normal pulses.  Pulmonary:     Effort: Pulmonary effort is normal. No respiratory distress.  Abdominal:     General: There is no distension.     Palpations: Abdomen is soft.  Musculoskeletal:        General: No tenderness or signs of injury.     Cervical back: Neck supple.     Right lower leg: No edema.     Left lower leg: No edema.     Comments: Patient has good distal strength without clonus.  Skin:    Findings: No erythema or rash.  Neurological:     General: No focal deficit present.     Mental Status: He is alert and oriented to person, place, and time.     Sensory: No sensory deficit.     Motor: No weakness or abnormal muscle tone.     Coordination: Coordination normal.  Psychiatric:        Mood and Affect: Mood normal.        Behavior: Behavior normal.     Imaging: No results found.

## 2021-05-27 NOTE — Progress Notes (Signed)
Pt state lower back pain. Pt state he has pain the runs down his left leg and pain the runs down the posterior of his right thigh. Pt state riding his bike and running makes the pain worse. Pt state he takes over the counter pain meds.  Pt has hx of inj on 02/16/21 pt state it helped just a little didn't last long. Pt state the one he had in Oct 2021 helped the most.  Numeric Pain Rating Scale and Functional Assessment Average Pain 3   In the last MONTH (on 0-10 scale) has pain interfered with the following?  1. General activity like being  able to carry out your everyday physical activities such as walking, climbing stairs, carrying groceries, or moving a chair?  Rating(9)   +Driver, -BT, -Dye Allergies.

## 2021-05-27 NOTE — Patient Instructions (Signed)

## 2021-05-27 NOTE — Patient Instructions (Signed)
Below is our plan:  We will continue lamotrigine 100mg  twice daily. Continue close follow up with your orthopedic and pain management provider for back pain and sciatica. Continue close follow up with psychiatry.  Please make sure you are staying well hydrated. I recommend 50-60 ounces daily. Well balanced diet and regular exercise encouraged. Consistent sleep schedule with 6-8 hours recommended.   Please continue follow up with care team as directed.   Follow up with Dr Felecia Shelling in 1 year   You may receive a survey regarding today's visit. I encourage you to leave honest feed back as I do use this information to improve patient care. Thank you for seeing me today!
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
## 2021-05-28 ENCOUNTER — Encounter: Payer: Self-pay | Admitting: Orthopedic Surgery

## 2021-05-28 ENCOUNTER — Ambulatory Visit (INDEPENDENT_AMBULATORY_CARE_PROVIDER_SITE_OTHER): Payer: Medicare Other | Admitting: Orthopedic Surgery

## 2021-05-28 ENCOUNTER — Ambulatory Visit (INDEPENDENT_AMBULATORY_CARE_PROVIDER_SITE_OTHER): Payer: Medicare Other

## 2021-05-28 VITALS — BP 133/82 | HR 95 | Ht 72.0 in | Wt 198.4 lb

## 2021-05-28 DIAGNOSIS — M25522 Pain in left elbow: Secondary | ICD-10-CM | POA: Diagnosis not present

## 2021-05-28 DIAGNOSIS — M7022 Olecranon bursitis, left elbow: Secondary | ICD-10-CM | POA: Diagnosis not present

## 2021-05-28 NOTE — Procedures (Signed)
S1 Lumbosacral Transforaminal Epidural Steroid Injection - Sub-Pedicular Approach with Fluoroscopic Guidance   Patient: Glenn Robertson      Date of Birth: 10-25-1965 MRN: [id number] PCP: Libby Maw, MD      Visit Date: 05/27/2021   Universal Protocol:    Date/Time: 09/30/226:47 PM  Consent Given By: the patient  Position:  PRONE  Additional Comments: Vital signs were monitored before and after the procedure. Patient was prepped and draped in the usual sterile fashion. The correct patient, procedure, and site was verified.   Injection Procedure Details:  Procedure Site One Meds Administered:  Meds ordered this encounter  Medications   betamethasone acetate-betamethasone sodium phosphate (CELESTONE) injection 12 mg    Laterality: Bilateral  Location/Site:  S1 Foramen   Needle size: 22 ga.  Needle type: Spinal  Needle Placement: Transforaminal  Findings:   -Comments: Excellent flow of contrast along the nerve, nerve root and into the epidural space.  Initial flow of contrast on the right was venous flow despite different rotations and ventral and dorsal movement.  Ultimately we were able to just restart that side from a different angle and did get good flow.  There does seem to be on the right some level of cut off of the flow indicating some narrowing.    Procedure Details: After squaring off the sacral end-plate to get a true AP view, the C-arm was positioned so that the best possible view of the S1 foramen was visualized. The soft tissues overlying this structure were infiltrated with 2-3 ml. of 1% Lidocaine without Epinephrine.    The spinal needle was inserted toward the target using a "trajectory" view along the fluoroscope beam.  Under AP and lateral visualization, the needle was advanced so it did not puncture dura. Biplanar projections were used to confirm position. Aspiration was confirmed to be negative for CSF and/or blood. A 1-2 ml. volume of  Isovue-250 was injected and flow of contrast was noted at each level. Radiographs were obtained for documentation purposes.   After attaining the desired flow of contrast documented above, a 0.5 to 1.0 ml test dose of 0.25% Marcaine was injected into each respective transforaminal space.  The patient was observed for 90 seconds post injection.  After no sensory deficits were reported, and normal lower extremity motor function was noted,   the above injectate was administered so that equal amounts of the injectate were placed at each foramen (level) into the transforaminal epidural space.   Additional Comments:  The patient tolerated the procedure well Dressing: Band-Aid with 2 x 2 sterile gauze    Post-procedure details: Patient was observed during the procedure. Post-procedure instructions were reviewed.  Patient left the clinic in stable condition.

## 2021-05-29 ENCOUNTER — Other Ambulatory Visit: Payer: Self-pay | Admitting: Family Medicine

## 2021-05-31 ENCOUNTER — Telehealth (HOSPITAL_COMMUNITY): Payer: Medicare Other | Admitting: Psychiatry

## 2021-05-31 DIAGNOSIS — M7022 Olecranon bursitis, left elbow: Secondary | ICD-10-CM | POA: Insufficient documentation

## 2021-05-31 NOTE — Progress Notes (Signed)
Office Visit Note   Patient: Glenn Robertson           Date of Birth: [birth year]           MRN: [id number] Visit Date: 05/28/2021              Requested by: Libby Maw, MD [address] [place],  [place] [address] PCP: Libby Maw, MD   Assessment & Plan: Visit Diagnoses:  1. Pain in left elbow   2. Olecranon bursitis of left elbow     Plan: We discussed the diagnosis, prognosis, non-operative and operative treatment options for olecranon bursitis.  He has not tried any treatment to date.   After our discussion, the patient would like to proceed with oral NSAIDs and compression dressing.  We reviewed the risks and benefits of conservative management.  The patient expressed understanding of the reasoning and strategy going forward.  All patient questions and concerns were addressed.    Follow-Up Instructions: No follow-ups on file.   Orders:  Orders Placed This Encounter  Procedures   XR Elbow Complete Left (3+View)   No orders of the defined types were placed in this encounter.     Procedures: No procedures performed   Clinical Data: No additional findings.   Subjective: Chief Complaint  Patient presents with   Left Elbow - Follow-up    This is a 55 yo RHD M who presents w/ L posterior elbow swelling after a fall approximately 1 month ago.  His pain is localized to swelling directly over the olecranon and only present when he puts direct pressure onto the swollen area or occasionally when he rises from a seated position. His pain is 3/10 at worst. He has no pain w/ elbow AROM.  He denies any drainage or systemic symptoms.    Review of Systems  Constitutional: Negative.   Respiratory: Negative.    Cardiovascular: Negative.   Skin: Negative.     Objective: Vital Signs: BP 133/82 (BP Location: Left Arm, Patient Position: Sitting, Cuff Size: Normal)   Pulse 95   Ht 6' (1.829 m)   Wt 198 lb 6.4 oz (90 kg)   SpO2 93%   BMI 26.91  kg/m   Physical Exam Cardiovascular:     Rate and Rhythm: Normal rate.     Pulses: Normal pulses.  Pulmonary:     Effort: Pulmonary effort is normal.  Skin:    General: Skin is warm and dry.     Capillary Refill: Capillary refill takes less than 2 seconds.  Neurological:     Mental Status: He is alert.    Left Elbow Exam   Tenderness  Left elbow tenderness location: Mildly TTP at swollen olecranon bursa.   Range of Motion  The patient has normal left elbow ROM.  Muscle Strength  The patient has normal left elbow strength.  Other  Erythema: absent Sensation: normal Pulse: present  Comments:  Swollen olecranon bursa w/ no surrounding erythema, induration, or wounds.  5/5 elbow extension against resistance.  Full and painless elbow ROM.      Specialty Comments:  No specialty comments available.  Imaging: Multiple views of the L elbow reviewed and interpreted by me.  They demonstrate mild to moderate ulnohumeral osteoarthritis.  There is no acute bony injury.  There is soft tissue swelling over the olecranon.    PMFS History: Patient Active Problem List   Diagnosis Date Noted   Olecranon bursitis of left elbow 05/31/2021   Allergic  rhinitis 01/18/2021   Skin lesion of left ear 01/18/2021   Abrasion of left pinna 12/25/2020   Abrasion of left knee 12/25/2020   Tremor due to multiple drugs 10/02/2020   Seborrheic dermatitis 10/02/2020   Polyneuropathy 05/12/2020   Elevated cholesterol 10/08/2019   Benign prostatic hyperplasia with nocturia 10/08/2019   Schizoaffective disorder, depressive type ([place]) 11/27/2018   Urinary hesitancy 10/08/2018   Erectile dysfunction 09/24/2018   Arm numbness 08/20/2018   Transient alteration of awareness 08/08/2018   Tendinopathy of right gluteus medius 05/22/2018   Pain of right hip joint 05/08/2018   Hearing difficulty 04/13/2018   Trochanteric bursitis, right hip 03/23/2018   Reactive airway disease 03/16/2018   Headache  03/02/2018   Type 2 diabetes mellitus without complication, without long-term current use of insulin ([place]) 02/16/2018   Healthcare maintenance 02/16/2018   Pain in left leg 10/28/2016   Lumbosacral radiculopathy at S1 03/15/2016   Lumbar degenerative disc disease 12/18/2015   Disc displacement, lumbar 04/21/2015   Chondromalacia of both patellae 02/24/2015   Paranoid schizophrenia, chronic condition ([place]) 02/24/2015   Right anterior knee pain 07/18/2014   Right ankle pain 01/27/2014   Pain in joint, ankle and foot 01/27/2014   Sciatica 01/10/2012   Disturbance of skin sensation 11/14/2011   Past Medical History:  Diagnosis Date   Allergy    dog and cats, citrus   Anxiety    Asthma    Chronic pain    Depression    Diabetes mellitus    Glaucoma    Neuromuscular disorder ([place])    Paranoid schizophrenia ([place])     Family History  Problem Relation Age of Onset   Diabetes Father    Diabetes Paternal Uncle    Colon cancer Neg Hx    Colon polyps Neg Hx    Esophageal cancer Neg Hx    Rectal cancer Neg Hx    Stomach cancer Neg Hx     Past Surgical History:  Procedure Laterality Date   ANKLE ARTHROSCOPY     Arm Surgery  1/12   rt ulnar nerve decompression   EPIDURAL BLOCK INJECTION     several   ULNAR NERVE TRANSPOSITION  01/19/2012   Procedure: ULNAR NERVE DECOMPRESSION/TRANSPOSITION;  Surgeon: Cammie Sickle., MD;  Location: [facility];  Service: Orthopedics;  Laterality: Left;  ulnar nerve decompression vs transposition left elbow   Social History   Occupational History   Occupation: Unemlpoyed-disabled  Tobacco Use   Smoking status: Never   Smokeless tobacco: Never  Vaping Use   Vaping Use: Never used  Substance and Sexual Activity   Alcohol use: No   Drug use: No   Sexual activity: Not on file

## 2021-06-03 ENCOUNTER — Encounter: Payer: Self-pay | Admitting: Orthopaedic Surgery

## 2021-06-07 ENCOUNTER — Ambulatory Visit (INDEPENDENT_AMBULATORY_CARE_PROVIDER_SITE_OTHER): Payer: Medicare Other | Admitting: Psychology

## 2021-06-07 DIAGNOSIS — F200 Paranoid schizophrenia: Secondary | ICD-10-CM | POA: Diagnosis not present

## 2021-06-07 DIAGNOSIS — F331 Major depressive disorder, recurrent, moderate: Secondary | ICD-10-CM

## 2021-06-07 DIAGNOSIS — F411 Generalized anxiety disorder: Secondary | ICD-10-CM | POA: Diagnosis not present

## 2021-06-07 DIAGNOSIS — F4310 Post-traumatic stress disorder, unspecified: Secondary | ICD-10-CM

## 2021-06-11 ENCOUNTER — Other Ambulatory Visit: Payer: Self-pay

## 2021-06-11 ENCOUNTER — Encounter: Payer: Self-pay | Admitting: Physical Medicine and Rehabilitation

## 2021-06-11 DIAGNOSIS — E119 Type 2 diabetes mellitus without complications: Secondary | ICD-10-CM

## 2021-06-11 MED ORDER — ACCU-CHEK GUIDE VI STRP: ORAL_STRIP | 3 refills | Status: DC

## 2021-06-12 DIAGNOSIS — Z23 Encounter for immunization: Secondary | ICD-10-CM | POA: Diagnosis not present

## 2021-06-13 ENCOUNTER — Encounter (HOSPITAL_COMMUNITY): Payer: Self-pay

## 2021-06-13 ENCOUNTER — Emergency Department (HOSPITAL_COMMUNITY)
Admission: EM | Admit: 2021-06-13 | Discharge: 2021-06-13 | Disposition: A | Discharge status: Home / Self Care | Payer: Medicare Other | Attending: Emergency Medicine | Admitting: Emergency Medicine

## 2021-06-13 ENCOUNTER — Other Ambulatory Visit: Payer: Self-pay

## 2021-06-13 DIAGNOSIS — R059 Cough, unspecified: Secondary | ICD-10-CM | POA: Diagnosis not present

## 2021-06-13 DIAGNOSIS — Z20822 Contact with and (suspected) exposure to covid-19: Secondary | ICD-10-CM | POA: Diagnosis not present

## 2021-06-13 DIAGNOSIS — E119 Type 2 diabetes mellitus without complications: Secondary | ICD-10-CM | POA: Insufficient documentation

## 2021-06-13 DIAGNOSIS — Z7984 Long term (current) use of oral hypoglycemic drugs: Secondary | ICD-10-CM | POA: Insufficient documentation

## 2021-06-13 DIAGNOSIS — J45909 Unspecified asthma, uncomplicated: Secondary | ICD-10-CM | POA: Diagnosis not present

## 2021-06-13 DIAGNOSIS — R000 Tachycardia, unspecified: Secondary | ICD-10-CM | POA: Diagnosis not present

## 2021-06-13 LAB — RESP PANEL BY RT-PCR (FLU A&B, COVID) ARPGX2
Influenza A by PCR: NEGATIVE
Influenza B by PCR: NEGATIVE
SARS Coronavirus 2 by RT PCR: NEGATIVE

## 2021-06-13 MED ORDER — IPRATROPIUM-ALBUTEROL 0.5-2.5 (3) MG/3ML IN SOLN
3.0000 mL | Freq: Once | RESPIRATORY_TRACT | Status: AC
  Administered 2021-06-13: 3 mL via RESPIRATORY_TRACT
  Filled 2021-06-13: qty 3

## 2021-06-13 NOTE — ED Provider Notes (Addendum)
[facility] DEPT Provider Note   CSN: [id number] Arrival date & time: 06/13/21  1205     History Chief Complaint  Patient presents with   Asthma    Glenn Robertson is a 55 y.o. male with a past medical history of depression, schizoaffective disorder, anxiety and asthma presenting today with a complaint of cough.  Patient reports that he went for half a mile run and when he returned he had a cough.  Reports he self diagnosed himself with asthma in the 90s and obtained an albuterol inhaler.  He utilized this inhaler prior to working out and when he returned he used it again because of his cough.  This did not stop his coughing.  Denying chest pain or shortness of breath at this time.  No recent illness, fever, chills.  Cough is dry and nonproductive.  He is unsure of his asthma triggers.    Past Medical History:  Diagnosis Date   Allergy    dog and cats, citrus   Anxiety    Asthma    Chronic pain    Depression    Diabetes mellitus    Glaucoma    Neuromuscular disorder ([place])    Paranoid schizophrenia ([place])     Patient Active Problem List   Diagnosis Date Noted   Olecranon bursitis of left elbow 05/31/2021   Allergic rhinitis 01/18/2021   Skin lesion of left ear 01/18/2021   Abrasion of left pinna 12/25/2020   Abrasion of left knee 12/25/2020   Tremor due to multiple drugs 10/02/2020   Seborrheic dermatitis 10/02/2020   Polyneuropathy 05/12/2020   Elevated cholesterol 10/08/2019   Benign prostatic hyperplasia with nocturia 10/08/2019   Schizoaffective disorder, depressive type ([place]) 11/27/2018   Urinary hesitancy 10/08/2018   Erectile dysfunction 09/24/2018   Arm numbness 08/20/2018   Transient alteration of awareness 08/08/2018   Tendinopathy of right gluteus medius 05/22/2018   Pain of right hip joint 05/08/2018   Hearing difficulty 04/13/2018   Trochanteric bursitis, right hip 03/23/2018   Reactive airway disease 03/16/2018   Headache  03/02/2018   Type 2 diabetes mellitus without complication, without long-term current use of insulin ([place]) 02/16/2018   Healthcare maintenance 02/16/2018   Pain in left leg 10/28/2016   Lumbosacral radiculopathy at S1 03/15/2016   Lumbar degenerative disc disease 12/18/2015   Disc displacement, lumbar 04/21/2015   Chondromalacia of both patellae 02/24/2015   Paranoid schizophrenia, chronic condition ([place]) 02/24/2015   Right anterior knee pain 07/18/2014   Right ankle pain 01/27/2014   Pain in joint, ankle and foot 01/27/2014   Sciatica 01/10/2012   Disturbance of skin sensation 11/14/2011    Past Surgical History:  Procedure Laterality Date   ANKLE ARTHROSCOPY     Arm Surgery  1/12   rt ulnar nerve decompression   EPIDURAL BLOCK INJECTION     several   ULNAR NERVE TRANSPOSITION  01/19/2012   Procedure: ULNAR NERVE DECOMPRESSION/TRANSPOSITION;  Surgeon: Cammie Sickle., MD;  Location: [facility];  Service: Orthopedics;  Laterality: Left;  ulnar nerve decompression vs transposition left elbow       Family History  Problem Relation Age of Onset   Diabetes Father    Diabetes Paternal Uncle    Colon cancer Neg Hx    Colon polyps Neg Hx    Esophageal cancer Neg Hx    Rectal cancer Neg Hx    Stomach cancer Neg Hx     Social History   Tobacco  Use   Smoking status: Never   Smokeless tobacco: Never  Vaping Use   Vaping Use: Never used  Substance Use Topics   Alcohol use: No   Drug use: No    Home Medications Prior to Admission medications   Medication Sig Start Date End Date Taking? Authorizing Provider  ACCU-CHEK SOFTCLIX LANCETS lancets 3 (three) times daily. for testing 10/11/17   [provider]  albuterol (VENTOLIN HFA) 108 (90 Base) MCG/ACT inhaler Inhale 1-2 puffs into the lungs every 6 (six) hours as needed for wheezing or shortness of breath. 01/26/21   Libby Maw, MD  amantadine (SYMMETREL) 100 MG capsule Take 1 capsule  (100 mg total) by mouth 2 (two) times daily. 05/17/21   Cobos, Myer Peer, MD  Cariprazine HCl (VRAYLAR) 6 MG CAPS TAKE 1 CAPSULE(6 MG) BY MOUTH DAILY 05/17/21   Cobos, Myer Peer, MD  clonazePAM (KLONOPIN) 1 MG tablet Take 1 tablet (1 mg total) by mouth 3 (three) times daily as needed for anxiety. for anxiety 05/17/21 08/15/21  Cobos, Myer Peer, MD  clotrimazole-betamethasone (LOTRISONE) cream Apply 1 application topically 2 (two) times daily. 09/25/20   Dutch Quint B, FNP  fluPHENAZine (PROLIXIN) 5 MG tablet TAKE 1 TABLET(5 MG) BY MOUTH AT BEDTIME 05/17/21   Cobos, Myer Peer, MD  fluticasone [facility]) 50 MCG/ACT nasal spray SHAKE LIQUID AND USE 2 SPRAYS IN [facility] NOSTRIL DAILY 11/11/19   Libby Maw, MD  glipiZIDE (GLUCOTROL) 10 MG tablet TAKE 1 TABLET(10 MG) BY MOUTH TWICE DAILY 05/30/21   Libby Maw, MD  glucose blood (ACCU-CHEK GUIDE) test strip TEST THREE TIMES DAILY 06/11/21   Libby Maw, MD  JARDIANCE 25 MG TABS tablet TAKE 1 TABLET BY MOUTH DAILY 05/12/21   Libby Maw, MD  lamoTRIgine (LAMICTAL) 100 MG tablet Take 1 tablet by mouth twice daily 05/27/21   Lomax, Amy, NP  LUMIGAN 0.01 % SOLN INSTILL 1 DROP INTO AFFECTED EYE ONCE AT BEDTIME 10/28/20   [provider]  meloxicam (MOBIC) 15 MG tablet Take 1 tablet (15 mg total) by mouth daily. 03/04/21   Magnant, Charles L, PA-C  metFORMIN (GLUCOPHAGE-XR) 500 MG 24 hr tablet TAKE 2 TABLETS(1000 MG) BY MOUTH TWICE DAILY 03/31/21   Libby Maw, MD  montelukast (SINGULAIR) 10 MG tablet TAKE 1 TABLET(10 MG) BY MOUTH AT BEDTIME 05/13/20   Luetta Nutting, DO  mupirocin ointment (BACTROBAN) 2 % Apply a thin coat to sores in left ear twice daily. 12/25/20   Libby Maw, MD  RESTASIS 0.05 % ophthalmic emulsion  03/30/20   [provider]  sertraline (ZOLOFT) 100 MG tablet Take 1 tablet (100 mg total) by mouth daily. 05/17/21 11/13/21  Cobos, Myer Peer, MD  simvastatin (ZOCOR) 20 MG  tablet TAKE 1 TABLET(20 MG) BY MOUTH EVERY EVENING 06/16/20   Libby Maw, MD  tadalafil (CIALIS) 20 MG tablet TAKE ONE-HALF TO ONE TABLET BY MOUTH EVERY OTHER DAY AS NEEDED FOR ERECTILE DYSFUNCTION 10/25/20   Dutch Quint B, FNP  triamcinolone (KENALOG) 0.1 % Apply 1 application topically 2 (two) times daily. 11/09/20   Haydee Salter, MD  TYRVAYA 0.03 MG/ACT SOLN Spray 1 spray in each nostril twice daily, approximately 12 hours apart 10/22/20   [provider]  pregabalin (LYRICA) 100 MG capsule Take 100 mg by mouth daily.  11/14/11  [provider]    Allergies    Citric acid, Food, and Gabapentin  Review of Systems   Review of Systems  Constitutional:  Negative for chills and fever.  HENT:  Negative for sore throat.   Respiratory:  Positive for cough. Negative for shortness of breath and wheezing.   Cardiovascular:  Negative for chest pain.  Gastrointestinal:  Negative for nausea and vomiting.  All other systems reviewed and are negative.  Physical Exam Updated Vital Signs BP (!) 136/98 (BP Location: Left Arm)   Pulse (!) 109   Temp 97.6 F (36.4 C) (Oral)   Resp 18   Ht 6' (1.829 m)   Wt 89.8 kg   SpO2 97%   BMI 26.85 kg/m   Physical Exam Vitals and nursing note reviewed.  Constitutional:      General: He is not in acute distress.    Appearance: Normal appearance.  HENT:     Head: Normocephalic and atraumatic.     Mouth/Throat:     Mouth: Mucous membranes are moist.     Pharynx: Oropharynx is clear.  Eyes:     General: No scleral icterus.    Conjunctiva/sclera: Conjunctivae normal.  Cardiovascular:     Rate and Rhythm: Regular rhythm. Tachycardia present.  Pulmonary:     Effort: Pulmonary effort is normal. No respiratory distress.  Skin:    General: Skin is warm and dry.     Findings: No rash.  Neurological:     Mental Status: He is alert.  Psychiatric:        Mood and Affect: Mood normal.        Behavior: Behavior normal.     ED Results / Procedures / Treatments   Labs (all labs ordered are listed, but only abnormal results are displayed) Labs Reviewed  RESP PANEL BY RT-PCR (FLU A&B, COVID) ARPGX2    EKG None  Radiology No results found.  Procedures Procedures   Medications Ordered in ED Medications  ipratropium-albuterol (DUONEB) 0.5-2.5 (3) MG/3ML nebulizer solution 3 mL (3 mLs Nebulization Given 06/13/21 1306)    ED Course  I have reviewed the triage vital signs and the nursing notes.  Pertinent labs & imaging results that were available during my care of the patient were reviewed by me and considered in my medical decision making (see chart for details).    MDM Rules/Calculators/A&P Patient was evaluated by me in triage.  He was resting comfortably and not in respiratory distress.  He says that he is here because his cough would not stop.  Patient without URI symptoms however stated we might as well tested him for COVID and the flu.  His lung sounds were clear but I will give him a nebulizer and reassess.  He will likely be discharged without steroids and told to follow-up with his primary care.  We discussed proper use of albuterol inhalers because the patient was unsure he was utilizing his inhaler correctly.  Low suspicion for pulmonary embolus.  Low likelihood category and Wells PE score.  Patient reports feeling better after the nebulizer.  He continues to be tachycardic however he was tachycardic on arrival likely due to multiple uses of his inhaler.  Still tachycardic but this is expected after a DuoNeb.  He is ambulatory and stable for discharge.  COVID and flu test negative. He does not need to be discharged on steroids.  He will follow-up with his primary care if this type of thing continues.  Ambulatory and will be discharged. Final Clinical Impression(s) / ED Diagnoses Final diagnoses:  Cough, unspecified type    Rx / DC Orders Results and diagnoses were explained to  the patient.  Return precautions discussed in full. Patient had no additional questions and expressed complete understanding.     Rhae Hammock, PA-C 06/13/21 1359    Rhae Hammock, PA-C 06/13/21 [place], Ankit, MD 06/13/21 1442

## 2021-06-13 NOTE — Discharge Instructions (Signed)
You were evaluated for a cough today.  I do not believe this was an asthma attack however you should follow-up with your primary care provider about this incident.  It may be helpful to reassess the severity of your asthma in an outpatient manner.  You may continue running if you would like.  Try to use your inhaler at least 15 minutes prior to initiating your exercise.  Information about asthma attacks is attached to your discharge papers.  It was a pleasure to meet you today and I hope that you feel better.

## 2021-06-13 NOTE — ED Triage Notes (Signed)
Pt states that he is having an asthma exacerbation. Pt states that he used his inhaler before his run, then after, but still feels SHOB and tight. Pt states in the past he has had cough attacks when it feels like this and wants to prevent the same. Pt speaking in full sentences.

## 2021-06-23 ENCOUNTER — Other Ambulatory Visit: Payer: Self-pay | Admitting: Neurology

## 2021-06-28 ENCOUNTER — Encounter (HOSPITAL_COMMUNITY): Payer: Self-pay | Admitting: Psychiatry

## 2021-06-28 ENCOUNTER — Other Ambulatory Visit (HOSPITAL_COMMUNITY): Payer: Self-pay | Admitting: Psychiatry

## 2021-06-28 ENCOUNTER — Other Ambulatory Visit: Payer: Self-pay

## 2021-06-28 ENCOUNTER — Telehealth (HOSPITAL_BASED_OUTPATIENT_CLINIC_OR_DEPARTMENT_OTHER): Payer: Medicare Other | Admitting: Psychiatry

## 2021-06-28 DIAGNOSIS — F251 Schizoaffective disorder, depressive type: Secondary | ICD-10-CM | POA: Diagnosis not present

## 2021-06-28 DIAGNOSIS — R404 Transient alteration of awareness: Secondary | ICD-10-CM | POA: Diagnosis not present

## 2021-06-28 MED ORDER — FLUPHENAZINE HCL 5 MG PO TABS: ORAL_TABLET | ORAL | 1 refills | Status: DC

## 2021-06-28 MED ORDER — CLONAZEPAM 1 MG PO TABS: 1.0000 mg | ORAL_TABLET | Freq: Three times a day (TID) | ORAL | 1 refills | Status: DC | PRN

## 2021-06-28 MED ORDER — SERTRALINE HCL 100 MG PO TABS: 100.0000 mg | ORAL_TABLET | Freq: Every day | ORAL | 1 refills | Status: DC

## 2021-06-28 MED ORDER — AMANTADINE HCL 100 MG PO CAPS: 100.0000 mg | ORAL_CAPSULE | Freq: Two times a day (BID) | ORAL | 1 refills | Status: DC

## 2021-06-28 NOTE — Progress Notes (Signed)
Rudene Christians MD/PA/NP OP Progress Note  06/28/2021  Glenn Robertson  MRN:  [id number]   Chief Complaint:  Patient returns for medication management  This appointment was via telephone.  Patient's identity was verified using 2 separate identifiers.  Limitations associated with this type of visit have been reviewed. [facility]  HPI: 54 yo male, who has been diagnosed with schizoaffective disorder. On disability He was being followed by Dr . Montel Culver, who has left the clinic, and I am providing follow ups /bridging until new provider starts .  Reports he is doing "OK". Functionin g well in daily activities. Mostly at hokme , lives with mother, but is socializing some and states for example that he is going to the movies with a neighbor later this week. Continues to describe some paranoid ideations and for example reports that someone stole his trash recently, leading him to think he is being monitored somehow. However , he has a degree of preserved reality testing and states he realizes he has a tendency to have paranoid thoughts and that it " may be nothing " . Denies depression or significant neuro-vegetative symptoms. He continues to engage in self learning, primarily languages . Currently studying [place] and states interested in learning Spanish as well. Denies suicidal ideations . Denies homicidal or violent ideations . States he would only act violently if someone attacked him first  Denies medication side effects- we reviewed side effect profiles to include risk of TD and of metabolic disturbances associated with antipsychotic medications , as well as potential for sedation and tolerance, abuse related to BZDs. Of note reports he has taken Klonopin for many years now, and denies history of misusing .            Visit Diagnosis:  No diagnosis found.  Past Psychiatric History: Please see intake H&P.  Past  Medical History:  Past Medical History:  Diagnosis Date   Allergy    dog and cats, citrus   Anxiety    Asthma    Chronic pain    Depression    Diabetes mellitus    Glaucoma    Neuromuscular disorder ([place])    Paranoid schizophrenia ([place])     Past Surgical History:  Procedure Laterality Date   ANKLE ARTHROSCOPY     Arm Surgery  1/12   rt ulnar nerve decompression   EPIDURAL BLOCK INJECTION     several   ULNAR NERVE TRANSPOSITION  01/19/2012   Procedure: ULNAR NERVE DECOMPRESSION/TRANSPOSITION;  Surgeon: Cammie Sickle., MD;  Location: [facility];  Service: Orthopedics;  Laterality: Left;  ulnar nerve decompression vs transposition left elbow    Family Psychiatric History: None.  Family History:  Family History  Problem Relation Age of Onset   Diabetes Father    Diabetes Paternal Uncle    Colon cancer Neg Hx    Colon polyps Neg Hx    Esophageal cancer Neg Hx    Rectal cancer Neg Hx    Stomach cancer Neg Hx     Social History:  Social History   Socioeconomic History   Marital status: Single    Spouse name: Not on file   Number of children: 0   Years of education: BA   Highest education level: Not on file  Occupational History   Occupation: Unemlpoyed-disabled  Tobacco Use   Smoking status: Never   Smokeless tobacco: Never  Vaping Use   Vaping Use: Never used  Substance and Sexual Activity   Alcohol use: No   Drug use: No   Sexual activity: Not on file  Other Topics Concern   Not on file  Social History Narrative   Lives with mother   Caffeine use: Coke very rare   Right handed    Social Determinants of Health   Financial Resource Strain: Not on file  Food Insecurity: Not on file  Transportation Needs: Not on file  Physical Activity: Not on file  Stress: Not on file  Social Connections: Not on file    Allergies:  Allergies  Allergen Reactions   Citric Acid Other (See Comments)    Runny nose, eyes water   Food     Oranges  or anything with citric acid   Gabapentin     Walking into things, hard to maintain balance, falls    Metabolic Disorder Labs: Lab Results  Component Value Date   HGBA1C 7.2 (H) 09/22/2020   MPG 123 03/02/2018   No results found for: PROLACTIN Lab Results  Component Value Date   CHOL 141 09/22/2020   TRIG 124.0 09/22/2020   HDL 56.30 09/22/2020   CHOLHDL 3 09/22/2020   VLDL 24.8 09/22/2020   LDLCALC 60 09/22/2020   LDLCALC 41 04/06/2020   Lab Results  Component Value Date   TSH 1.02 07/08/2019   TSH 0.92 03/15/2018    Therapeutic Level Labs: No results found for: LITHIUM No results found for: VALPROATE No components found for:  CBMZ  Current Medications: Current Outpatient Medications  Medication Sig Dispense Refill   ACCU-CHEK SOFTCLIX LANCETS lancets 3 (three) times daily. for testing  0   albuterol (VENTOLIN HFA) 108 (90 Base) MCG/ACT inhaler Inhale 1-2 puffs into the lungs every 6 (six) hours as needed for wheezing or shortness of breath. 18 g 1   amantadine (SYMMETREL) 100 MG capsule Take 1 capsule (100 mg total) by mouth 2 (two) times daily. 60 capsule 1   Cariprazine HCl (VRAYLAR) 6 MG CAPS TAKE 1 CAPSULE(6 MG) BY MOUTH DAILY 30 capsule 1   clonazePAM (KLONOPIN) 1 MG tablet Take 1 tablet (1 mg total) by mouth 3 (three) times daily as needed for anxiety. for anxiety 90 tablet 1   clotrimazole-betamethasone (LOTRISONE) cream Apply 1 application topically 2 (two) times daily. 45 g 0   fluPHENAZine (PROLIXIN) 5 MG tablet TAKE 1 TABLET(5 MG) BY MOUTH AT BEDTIME 30 tablet 1   fluticasone (FLONASE) 50 MCG/ACT nasal spray SHAKE LIQUID AND USE 2 SPRAYS IN EACH NOSTRIL DAILY 16 g 6   glipiZIDE (GLUCOTROL) 10 MG tablet TAKE 1 TABLET(10 MG) BY MOUTH TWICE DAILY 180 tablet 3   glucose blood (ACCU-CHEK GUIDE) test strip TEST THREE TIMES DAILY 100 strip 3   JARDIANCE 25 MG TABS tablet TAKE 1 TABLET BY MOUTH DAILY 90 tablet 0   lamoTRIgine (LAMICTAL) 100 MG tablet Take 1 tablet  by mouth twice daily 180 tablet 3   LUMIGAN 0.01 % SOLN INSTILL 1 DROP INTO AFFECTED EYE ONCE AT BEDTIME     meloxicam (MOBIC) 15 MG tablet Take 1 tablet (15 mg total) by mouth daily. 30 tablet 3   metFORMIN (GLUCOPHAGE-XR) 500 MG 24 hr tablet TAKE 2 TABLETS(1000 MG) BY MOUTH TWICE DAILY 360 tablet 1   montelukast (SINGULAIR) 10 MG tablet TAKE 1 TABLET(10 MG) BY MOUTH AT BEDTIME 90 tablet 3   mupirocin ointment (BACTROBAN) 2 % Apply a thin coat to sores in left ear twice daily. 15 g 0  RESTASIS 0.05 % ophthalmic emulsion      sertraline (ZOLOFT) 100 MG tablet Take 1 tablet (100 mg total) by mouth daily. 30 tablet 1   simvastatin (ZOCOR) 20 MG tablet TAKE 1 TABLET(20 MG) BY MOUTH EVERY EVENING 90 tablet 3   tadalafil (CIALIS) 20 MG tablet TAKE ONE-HALF TO ONE TABLET BY MOUTH EVERY OTHER DAY AS NEEDED FOR ERECTILE DYSFUNCTION 10 tablet 4   triamcinolone (KENALOG) 0.1 % Apply 1 application topically 2 (two) times daily. 30 g 0   TYRVAYA 0.03 MG/ACT SOLN Spray 1 spray in each nostril twice daily, approximately 12 hours apart     No current facility-administered medications for this visit.       Psychiatric Specialty Exam: Please take into account limitations in obtaining a full mental status exam in the context of phone based communication Review of Systems  Psychiatric/Behavioral:  The patient is nervous/anxious.   All other systems reviewed and are negative.Does not endorse   There were no vitals taken for this visit.There is no height or weight on file to calculate BMI.  General Appearance: NA  Eye Contact: N/A  Speech:  Normal   Volume:  Normal  Mood:  presents with stable mood, does not endorse depression, reports mood "OK"  Affect: Appears appropriate, reactive  Thought Process:  Goal Directed/ linear   Orientation:  Full (Time, Place, and Person)  Thought Content: denies hallucinations, reports vague paranoid ideations and states he thinks someone stole his trash recently. He does  express some reality testing and is able to state he realizes he has a tendency towards paranoid ideations and that " maybe it was nothing". Denies SI, denies HI  Suicidal Thoughts:  No denies SI or self injurious ideations  Homicidal Thoughts:  No  denies HI  Memory: recent and remote grossly intact   Judgement:  Good  Insight:  Fair  Psychomotor Activity:  unable to assess as appointment via phone   Concentration:  Good   Recall:  [place] of Knowledge: Good  Language: Good  Akathisia:  Negative  Handed:  Right  AIMS (if indicated): NA  Assets:  Communication Skills Desire for Improvement Financial Resources/Insurance Housing  ADL's:  Intact  Cognition: WNL  Sleep:  Fair   Screenings: PHQ2-9    [place] Office Visit from 12/25/2020 in [facility] from 04/06/2020 in [facility] from 02/26/2020 in [facility] from 10/08/2019 in [facility]  PHQ-2 Total Score 1 1 3 2  0  PHQ-9 Total Score -- -- 11 -- --      [facility] ED from 06/13/2021 in [facility] DEPT  C-SSRS RISK CATEGORY No Risk        Assessment and Plan: 55 yo single AAM with schizophrenia paranoid type vs schizoaffective disorder.   Generally stable , reports functioning well in daily activities . Endorses some chronic paranoid ideations, but no overt or systematized delusions expressed and some preserved reality testing noted. Mood stable . Tolerating medications well . We discussed options ,to include working on simplifying medication regimen , at this time his preference is to continue current management . Side effects, to include risk of TD /metabolic disturbances, have been reviewed. Note patient is on two antipsychotic medications ( Vraylar and Fluphenazine ) - appears to be  responding well to this combination therapy .  Plan:  Continue  Zoloft 100 mg  QDAY for depression, anxiety Continue Vraylar 6 mg for paranoia, psychosis Continue Klonopin 1 mg TID PRN for anxiety Continue Amantadine 100 mg BID to minimize tremors /EPS  Continue Fluphenazine 5 mg QHS for paranoia , psychosis  Side effects reviewed   Next appointment in approximately in 6 weeks . Agrees to contact clinic sooner if any worsening prior or medication concerns  Plans to continue following up with his PCP for medical management Of note, he is expressing interest in being referred for individual psychotherapy.      Jenne Campus, MD 06/28/2021, 8:45 AM Patient ID: Glenn Robertson, male   DOB: 1966-05-01, 55 y.o.   MRN: [id number]

## 2021-06-29 ENCOUNTER — Encounter: Payer: Self-pay | Admitting: Physical Medicine and Rehabilitation

## 2021-07-01 ENCOUNTER — Other Ambulatory Visit: Payer: Self-pay | Admitting: Family Medicine

## 2021-07-07 ENCOUNTER — Other Ambulatory Visit: Payer: Self-pay | Admitting: Family Medicine

## 2021-07-07 ENCOUNTER — Other Ambulatory Visit (HOSPITAL_COMMUNITY): Payer: Self-pay | Admitting: Psychiatry

## 2021-07-14 ENCOUNTER — Other Ambulatory Visit: Payer: Self-pay

## 2021-07-15 ENCOUNTER — Ambulatory Visit (INDEPENDENT_AMBULATORY_CARE_PROVIDER_SITE_OTHER): Payer: Medicare Other | Admitting: Family Medicine

## 2021-07-15 ENCOUNTER — Encounter: Payer: Self-pay | Admitting: Family Medicine

## 2021-07-15 VITALS — BP 118/74 | HR 81 | Temp 97.2°F | Ht 69.75 in | Wt 199.4 lb

## 2021-07-15 DIAGNOSIS — N401 Enlarged prostate with lower urinary tract symptoms: Secondary | ICD-10-CM

## 2021-07-15 DIAGNOSIS — R351 Nocturia: Secondary | ICD-10-CM | POA: Diagnosis not present

## 2021-07-15 DIAGNOSIS — E7800 Pure hypercholesterolemia, unspecified: Secondary | ICD-10-CM | POA: Diagnosis not present

## 2021-07-15 DIAGNOSIS — Z0000 Encounter for general adult medical examination without abnormal findings: Secondary | ICD-10-CM

## 2021-07-15 DIAGNOSIS — E119 Type 2 diabetes mellitus without complications: Secondary | ICD-10-CM

## 2021-07-15 LAB — LIPID PANEL
Cholesterol: 152 mg/dL (ref 0–200)
HDL: 61 mg/dL (ref >39.00)
LDL Cholesterol: 77 mg/dL (ref 0–99)
NonHDL: 90.86
Total CHOL/HDL Ratio: 2
Triglycerides: 67 mg/dL (ref 0.0–149.0)
VLDL: 13.4 mg/dL (ref 0.0–40.0)

## 2021-07-15 LAB — URINALYSIS, ROUTINE W REFLEX MICROSCOPIC
Bilirubin Urine: NEGATIVE
Hgb urine dipstick: NEGATIVE
Ketones, ur: NEGATIVE
Leukocytes,Ua: NEGATIVE
Nitrite: NEGATIVE
RBC / HPF: NONE SEEN (ref 0–?)
Specific Gravity, Urine: <=1.005 — AB (ref 1.000–1.030)
Total Protein, Urine: NEGATIVE
Urine Glucose: >=1000 — AB
Urobilinogen, UA: 0.2 (ref 0.0–1.0)
WBC, UA: NONE SEEN (ref 0–?)
pH: 6 (ref 5.0–8.0)

## 2021-07-15 LAB — COMPREHENSIVE METABOLIC PANEL
ALT: 13 U/L (ref 0–53)
AST: 13 U/L (ref 0–37)
Albumin: 4.8 g/dL (ref 3.5–5.2)
Alkaline Phosphatase: 82 U/L (ref 39–117)
BUN: 14 mg/dL (ref 6–23)
CO2: 29 mEq/L (ref 19–32)
Calcium: 9.6 mg/dL (ref 8.4–10.5)
Chloride: 101 mEq/L (ref 96–112)
Creatinine, Ser: 1.15 mg/dL (ref 0.40–1.50)
GFR: 71.97 mL/min (ref >60.00)
Glucose, Bld: 77 mg/dL (ref 70–99)
Potassium: 4.1 mEq/L (ref 3.5–5.1)
Sodium: 137 mEq/L (ref 135–145)
Total Bilirubin: 0.8 mg/dL (ref 0.2–1.2)
Total Protein: 7.3 g/dL (ref 6.0–8.3)

## 2021-07-15 LAB — CBC
HCT: 48.2 % (ref 39.0–52.0)
Hemoglobin: 15.3 g/dL (ref 13.0–17.0)
MCHC: 31.7 g/dL (ref 30.0–36.0)
MCV: 87.6 fl (ref 78.0–100.0)
Platelets: 191 10*3/uL (ref 150.0–400.0)
RBC: 5.5 Mil/uL (ref 4.22–5.81)
RDW: 12.9 % (ref 11.5–15.5)
WBC: 7 10*3/uL (ref 4.0–10.5)

## 2021-07-15 LAB — MICROALBUMIN / CREATININE URINE RATIO
Creatinine,U: 31.5 mg/dL
Microalb Creat Ratio: 2.2 mg/g (ref 0.0–30.0)
Microalb, Ur: <0.7 mg/dL (ref 0.0–1.9)

## 2021-07-15 LAB — LDL CHOLESTEROL, DIRECT: Direct LDL: 78 mg/dL

## 2021-07-15 LAB — PSA: PSA: 0.44 ng/mL (ref 0.10–4.00)

## 2021-07-15 LAB — HEMOGLOBIN A1C: Hgb A1c MFr Bld: 7.7 % — ABNORMAL HIGH (ref 4.6–6.5)

## 2021-07-15 NOTE — Progress Notes (Signed)
Established Patient Office Visit  Subjective:  Patient ID: Glenn Robertson, male    DOB: [birth year]  Age: 55 y.o. MRN: [id number]  CC:  Chief Complaint  Patient presents with   Annual Exam    CPE/labs.  Fasting today.  No concerns.      HPI Glenn Robertson presents for a physical exam and follow-up of diabetes and elevated cholesterol.  Has been doing relatively well.  Unfortunately he cannot run right now because he sciatica.  He has seen orthopedics for this problem as well his various other orthopedic issues.  Sees ophthalmology twice yearly for follow-up of glaucoma.  Moving his bowels normally.  Urine flow is good especially after physical therapy, he tells me.  Currently in physical therapy for core strengthening regarding his ongoing back issues.  Ongoing follow-up with mental health for paranoid schizophrenia.  He does not smoke drink alcohol or use illicit drugs.  He is fasting this morning.  He had taken a statin in the past but somehow it was discontinued he tells me.  Assures me that he has had his pneumonia and shingles vaccines through his pharmacy.  Past Medical History:  Diagnosis Date   Allergy    dog and cats, citrus   Anxiety    Asthma    Chronic pain    Depression    Diabetes mellitus    Glaucoma    Neuromuscular disorder ([place])    Paranoid schizophrenia ([place])     Past Surgical History:  Procedure Laterality Date   ANKLE ARTHROSCOPY     Arm Surgery  1/12   rt ulnar nerve decompression   EPIDURAL BLOCK INJECTION     several   ULNAR NERVE TRANSPOSITION  01/19/2012   Procedure: ULNAR NERVE DECOMPRESSION/TRANSPOSITION;  Surgeon: Cammie Sickle., MD;  Location: [facility];  Service: Orthopedics;  Laterality: Left;  ulnar nerve decompression vs transposition left elbow    Family History  Problem Relation Age of Onset   Diabetes Father    Diabetes Paternal Uncle    Colon cancer Neg Hx    Colon polyps Neg Hx    Esophageal cancer Neg Hx     Rectal cancer Neg Hx    Stomach cancer Neg Hx     Social History   Socioeconomic History   Marital status: Single    Spouse name: Not on file   Number of children: 0   Years of education: BA   Highest education level: Not on file  Occupational History   Occupation: Unemlpoyed-disabled  Tobacco Use   Smoking status: Never   Smokeless tobacco: Never  Vaping Use   Vaping Use: Never used  Substance and Sexual Activity   Alcohol use: No   Drug use: No   Sexual activity: Yes  Other Topics Concern   Not on file  Social History Narrative   Lives with mother   Caffeine use: Coke very rare   Right handed    Social Determinants of Health   Financial Resource Strain: Not on file  Food Insecurity: Not on file  Transportation Needs: Not on file  Physical Activity: Not on file  Stress: Not on file  Social Connections: Not on file  Intimate Partner Violence: Not on file    Outpatient Medications Prior to Visit  Medication Sig Dispense Refill   ACCU-CHEK SOFTCLIX LANCETS lancets 3 (three) times daily. for testing  0   albuterol (VENTOLIN HFA) 108 (90 Base) MCG/ACT inhaler Inhale 1-2 puffs into the lungs  every 6 (six) hours as needed for wheezing or shortness of breath. 18 g 1   amantadine (SYMMETREL) 100 MG capsule Take 1 capsule (100 mg total) by mouth 2 (two) times daily. 60 capsule 1   Cariprazine HCl (VRAYLAR) 6 MG CAPS TAKE 1 CAPSULE(6 MG) BY MOUTH DAILY 30 capsule 1   clonazePAM (KLONOPIN) 1 MG tablet Take 1 tablet (1 mg total) by mouth 3 (three) times daily as needed for anxiety. for anxiety 90 tablet 1   clotrimazole-betamethasone (LOTRISONE) cream Apply 1 application topically 2 (two) times daily. 45 g 0   fluPHENAZine (PROLIXIN) 5 MG tablet TAKE 1 TABLET(5 MG) BY MOUTH AT BEDTIME 30 tablet 1   fluticasone (FLONASE) 50 MCG/ACT nasal spray SHAKE LIQUID AND USE 2 SPRAYS IN EACH NOSTRIL DAILY 16 g 6   glipiZIDE (GLUCOTROL) 10 MG tablet TAKE 1 TABLET(10 MG) BY MOUTH TWICE DAILY  180 tablet 3   glucose blood (ACCU-CHEK GUIDE) test strip TEST THREE TIMES DAILY 100 strip 3   JARDIANCE 25 MG TABS tablet TAKE 1 TABLET BY MOUTH DAILY 90 tablet 0   lamoTRIgine (LAMICTAL) 100 MG tablet Take 1 tablet by mouth twice daily 180 tablet 3   LUMIGAN 0.01 % SOLN INSTILL 1 DROP INTO AFFECTED EYE ONCE AT BEDTIME     meloxicam (MOBIC) 15 MG tablet Take 1 tablet (15 mg total) by mouth daily. 30 tablet 3   metFORMIN (GLUCOPHAGE-XR) 500 MG 24 hr tablet TAKE 2 TABLETS(1000 MG) BY MOUTH TWICE DAILY 360 tablet 1   montelukast (SINGULAIR) 10 MG tablet TAKE 1 TABLET(10 MG) BY MOUTH AT BEDTIME 90 tablet 3   mupirocin ointment (BACTROBAN) 2 % Apply a thin coat to sores in left ear twice daily. 15 g 0   RESTASIS 0.05 % ophthalmic emulsion      sertraline (ZOLOFT) 100 MG tablet Take 1 tablet (100 mg total) by mouth daily. 30 tablet 1   tadalafil (CIALIS) 20 MG tablet TAKE ONE-HALF TO ONE TABLET BY MOUTH EVERY OTHER DAY AS NEEDED FOR ERECTILE DYSFUNCTION 10 tablet 4   triamcinolone (KENALOG) 0.1 % Apply 1 application topically 2 (two) times daily. 30 g 0   TYRVAYA 0.03 MG/ACT SOLN Spray 1 spray in each nostril twice daily, approximately 12 hours apart     simvastatin (ZOCOR) 20 MG tablet TAKE 1 TABLET(20 MG) BY MOUTH EVERY EVENING (Patient not taking: Reported on 07/15/2021) 90 tablet 3   No facility-administered medications prior to visit.    Allergies  Allergen Reactions   Citric Acid Other (See Comments)    Runny nose, eyes water   Food     Oranges or anything with citric acid   Gabapentin     Walking into things, hard to maintain balance, falls    ROS Review of Systems  Constitutional:  Negative for diaphoresis, fatigue, fever and unexpected weight change.  HENT: Negative.    Eyes:  Negative for photophobia and visual disturbance.  Respiratory: Negative.    Cardiovascular: Negative.   Gastrointestinal: Negative.  Negative for abdominal pain, anal bleeding, blood in stool and  constipation.  Endocrine: Negative for polyphagia and polyuria.  Genitourinary:  Negative for difficulty urinating, frequency and urgency.  Musculoskeletal:  Positive for arthralgias, back pain and gait problem.  Neurological:  Positive for numbness. Negative for speech difficulty and weakness.  Hematological:  Does not bruise/bleed easily.     Objective:    Physical Exam Vitals and nursing note reviewed.  Constitutional:      General:  He is not in acute distress.    Appearance: Normal appearance. He is not ill-appearing, toxic-appearing or diaphoretic.  HENT:     Head: Normocephalic and atraumatic.     Right Ear: Tympanic membrane, ear canal and external ear normal.     Left Ear: Tympanic membrane, ear canal and external ear normal.     Mouth/Throat:     Mouth: Mucous membranes are moist.     Pharynx: Oropharynx is clear. No oropharyngeal exudate or posterior oropharyngeal erythema.  Eyes:     General: No scleral icterus.       Right eye: No discharge.        Left eye: No discharge.     Extraocular Movements: Extraocular movements intact.     Pupils: Pupils are equal, round, and reactive to light.  Neck:     Vascular: No carotid bruit.  Cardiovascular:     Rate and Rhythm: Normal rate and regular rhythm.  Pulmonary:     Effort: Pulmonary effort is normal.     Breath sounds: Normal breath sounds.  Abdominal:     General: Abdomen is flat. Bowel sounds are normal. There is no distension.     Palpations: Abdomen is soft. There is no mass.     Tenderness: There is no abdominal tenderness.     Hernia: No hernia is present.  Genitourinary:    Comments: Patient declines anogenital exam. Musculoskeletal:     Cervical back: No rigidity or tenderness.     Right lower leg: No edema.     Left lower leg: No edema.  Lymphadenopathy:     Cervical: No cervical adenopathy.  Skin:    General: Skin is warm and dry.  Neurological:     Mental Status: He is alert and oriented to person,  place, and time. Mental status is at baseline.  Psychiatric:        Mood and Affect: Mood normal.        Behavior: Behavior normal.   Diabetic Foot Exam - Simple   Simple Foot Form Diabetic Foot exam was performed with the following findings: Yes 07/15/2021  8:18 AM  Visual Inspection See comments: Yes Sensation Testing Intact to touch and monofilament testing bilaterally: Yes Pulse Check Posterior Tibialis and Dorsalis pulse intact bilaterally: Yes Comments Both feet with relaxed arches.  Sensory is intact to light touch.        BP 118/74   Pulse 81   Temp (!) 97.2 F (36.2 C) (Temporal)   Ht 5' 9.75" (1.772 m)   Wt 199 lb 6.4 oz (90.4 kg)   SpO2 95%   BMI 28.82 kg/m  Wt Readings from Last 3 Encounters:  07/15/21 199 lb 6.4 oz (90.4 kg)  06/13/21 198 lb (89.8 kg)  05/28/21 198 lb 6.4 oz (90 kg)     Health Maintenance Due  Topic Date Due   Zoster Vaccines- Shingrix (1 of 2) Never done   Pneumococcal Vaccine 109-71 Years old (2 - PPSV23 if available, else PCV20) 03/23/2018   COVID-19 Vaccine (4 - Booster for Pfizer series) 08/27/2020   URINE MICROALBUMIN  10/07/2020   OPHTHALMOLOGY EXAM  03/19/2021   HEMOGLOBIN A1C  03/22/2021    There are no preventive care reminders to display for this patient.  Lab Results  Component Value Date   TSH 1.02 07/08/2019   Lab Results  Component Value Date   WBC 6.7 09/22/2020   HGB 14.8 09/22/2020   HCT 45.1 09/22/2020   MCV 86.5 09/22/2020  PLT 174.0 09/22/2020   Lab Results  Component Value Date   NA 136 09/22/2020   K 4.0 09/22/2020   CO2 26 09/22/2020   GLUCOSE 198 (H) 09/22/2020   BUN 16 09/22/2020   CREATININE 1.09 09/22/2020   BILITOT 0.4 09/22/2020   ALKPHOS 70 09/22/2020   AST 14 09/22/2020   ALT 13 09/22/2020   PROT 7.1 09/22/2020   ALBUMIN 4.7 09/22/2020   CALCIUM 9.4 09/22/2020   ANIONGAP 9 06/28/2019   GFR 77.19 09/22/2020   Lab Results  Component Value Date   CHOL 141 09/22/2020   Lab  Results  Component Value Date   HDL 56.30 09/22/2020   Lab Results  Component Value Date   LDLCALC 60 09/22/2020   Lab Results  Component Value Date   TRIG 124.0 09/22/2020   Lab Results  Component Value Date   CHOLHDL 3 09/22/2020   Lab Results  Component Value Date   HGBA1C 7.2 (H) 09/22/2020      Assessment & Plan:   Problem List Items Addressed This Visit       Endocrine   Type 2 diabetes mellitus without complication, without long-term current use of insulin (HCC)   Relevant Orders   CBC   Comprehensive metabolic panel   Hemoglobin A1c   Urinalysis, Routine w reflex microscopic   Microalbumin / creatinine urine ratio   Ambulatory referral to Ophthalmology     Other   Healthcare maintenance   Elevated cholesterol - Primary   Relevant Orders   Comprehensive metabolic panel   LDL cholesterol, direct   Lipid panel   Benign prostatic hyperplasia with nocturia   Relevant Orders   PSA    No orders of the defined types were placed in this encounter.   Follow-up: Return in about 1 year (around 07/15/2022), or if symptoms worsen or fail to improve.    Libby Maw, MD

## 2021-07-16 DIAGNOSIS — R269 Unspecified abnormalities of gait and mobility: Secondary | ICD-10-CM | POA: Diagnosis not present

## 2021-07-16 DIAGNOSIS — M25652 Stiffness of left hip, not elsewhere classified: Secondary | ICD-10-CM | POA: Diagnosis not present

## 2021-07-16 DIAGNOSIS — R2681 Unsteadiness on feet: Secondary | ICD-10-CM | POA: Diagnosis not present

## 2021-07-16 DIAGNOSIS — M5416 Radiculopathy, lumbar region: Secondary | ICD-10-CM | POA: Diagnosis not present

## 2021-07-18 DIAGNOSIS — R2681 Unsteadiness on feet: Secondary | ICD-10-CM | POA: Diagnosis not present

## 2021-07-18 DIAGNOSIS — M25652 Stiffness of left hip, not elsewhere classified: Secondary | ICD-10-CM | POA: Diagnosis not present

## 2021-07-18 DIAGNOSIS — R269 Unspecified abnormalities of gait and mobility: Secondary | ICD-10-CM | POA: Diagnosis not present

## 2021-07-18 DIAGNOSIS — M5416 Radiculopathy, lumbar region: Secondary | ICD-10-CM | POA: Diagnosis not present

## 2021-07-20 DIAGNOSIS — M25652 Stiffness of left hip, not elsewhere classified: Secondary | ICD-10-CM | POA: Diagnosis not present

## 2021-07-20 DIAGNOSIS — D485 Neoplasm of uncertain behavior of skin: Secondary | ICD-10-CM | POA: Diagnosis not present

## 2021-07-20 DIAGNOSIS — R269 Unspecified abnormalities of gait and mobility: Secondary | ICD-10-CM | POA: Diagnosis not present

## 2021-07-20 DIAGNOSIS — R2681 Unsteadiness on feet: Secondary | ICD-10-CM | POA: Diagnosis not present

## 2021-07-20 DIAGNOSIS — M5416 Radiculopathy, lumbar region: Secondary | ICD-10-CM | POA: Diagnosis not present

## 2021-07-26 ENCOUNTER — Other Ambulatory Visit: Payer: Self-pay

## 2021-07-26 ENCOUNTER — Ambulatory Visit (HOSPITAL_BASED_OUTPATIENT_CLINIC_OR_DEPARTMENT_OTHER): Payer: Medicare Other | Admitting: Psychiatry

## 2021-07-26 ENCOUNTER — Encounter (HOSPITAL_COMMUNITY): Payer: Self-pay | Admitting: Psychiatry

## 2021-07-26 VITALS — BP 132/83 | HR 78 | Resp 18 | Ht 71.5 in | Wt 199.2 lb

## 2021-07-26 DIAGNOSIS — R404 Transient alteration of awareness: Secondary | ICD-10-CM

## 2021-07-26 DIAGNOSIS — F251 Schizoaffective disorder, depressive type: Secondary | ICD-10-CM

## 2021-07-26 MED ORDER — SERTRALINE HCL 100 MG PO TABS: 100.0000 mg | ORAL_TABLET | Freq: Every day | ORAL | 1 refills | Status: DC

## 2021-07-26 MED ORDER — VRAYLAR 6 MG PO CAPS: ORAL_CAPSULE | ORAL | 1 refills | Status: DC

## 2021-07-26 MED ORDER — AMANTADINE HCL 100 MG PO CAPS: 100.0000 mg | ORAL_CAPSULE | Freq: Two times a day (BID) | ORAL | 1 refills | Status: DC

## 2021-07-26 MED ORDER — FLUPHENAZINE HCL 5 MG PO TABS: ORAL_TABLET | ORAL | 1 refills | Status: DC

## 2021-07-26 MED ORDER — CLONAZEPAM 1 MG PO TABS: 1.0000 mg | ORAL_TABLET | Freq: Three times a day (TID) | ORAL | 1 refills | Status: DC | PRN

## 2021-07-26 NOTE — Progress Notes (Signed)
Rudene Christians MD/PA/NP OP Progress Note  06/28/2021  Carter Kassel  MRN:  [id number]   Chief Complaint:  Patient returns for medication management  This appointment was face to face , in office . Location - [facility] outpatient clinic Duration 25 minutes  HPI: 55 yo male, who has been diagnosed with schizoaffective disorder. On disability He was being followed by Dr . Montel Culver, who has left the clinic, and I am providing follow ups /bridging until new provider starts .  Reports he is going " all right ". He lives with his elderly mother, and helps her with daily activities, home upkeep, driving her to appointments . States unfortunately mother's physical health has been declining . He has a brother who is supportive and reports he spent Thanksgiving with brother and family.  He continues to have interest in languages and reports that at this time is studying [place], and has enrolled in a [place] college course starting early next year . He reports he enjoys languages and reading .  He continues to have ( chronic, persistent ) paranoid ideations . These are often vague ( such as having a feeling that he is being watched or monitored ) and at times feels that there are certain vehicles that drive by him or idle suspiciously for periods of time. He does not confront anyone in these situations but does state it leads to some avoidance, as prefers to stay at home if possible. He has some degree of reality testing and states " It seems real", but is able to sometimes say tos elf  " maybe its not real". Mood is stable and presents euthymic, affect is reactive , and not particularly guarded or suspicious . Of note, no significant negative type symptoms are noted or reported - as noted, he is studious ,learning languages , he exhibits a full range of affect , and is well groomed . He states he started having paranoid ideations back in his early 16s .   Denies medication side effects. We reviewed - he reports  current regimen working well for him and states he feels he has improved on it . He prefers to continue current meds. Note he is on two antipsychotics - Vraylar /Prolixin) . Reports he is " doing a lot better than before " on these meds . Denies side effects  , denies motor disturbances,but some intermittent  mandibular movements are noted during session. He states Symmetrel has been helpful, and denies side effects.  No SI, no HI or violent ideations . Denies hallucinations and does not present internally preoccupied   Labs reviewed  11/17 BPM, AST , ALT unremarkable. Cholesterol 152, LDL 77, HDL 61, Trigs 67, Hgb A1C 7.7 .  Patient reports he is taking Diabetes medications regularly and checking his glucose /fingerstick daily. He follows up with PCP for DM management.            Visit Diagnosis:  No diagnosis found.  Past Psychiatric History: Please see intake H&P.  Past Medical History:  Past Medical History:  Diagnosis Date   Allergy    dog and cats, citrus   Anxiety    Asthma    Chronic pain    Depression    Diabetes mellitus    Glaucoma    Neuromuscular disorder ([place])    Paranoid schizophrenia ([place])     Past Surgical History:  Procedure Laterality Date   ANKLE ARTHROSCOPY     Arm Surgery  1/12   rt ulnar nerve decompression  EPIDURAL BLOCK INJECTION     several   ULNAR NERVE TRANSPOSITION  01/19/2012   Procedure: ULNAR NERVE DECOMPRESSION/TRANSPOSITION;  Surgeon: Cammie Sickle., MD;  Location: [facility];  Service: Orthopedics;  Laterality: Left;  ulnar nerve decompression vs transposition left elbow    Family Psychiatric History: None.  Family History:  Family History  Problem Relation Age of Onset   Diabetes Father    Diabetes Paternal Uncle    Colon cancer Neg Hx    Colon polyps Neg Hx    Esophageal cancer Neg Hx    Rectal cancer Neg Hx    Stomach cancer Neg Hx     Social History:  Social History   Socioeconomic History    Marital status: Single    Spouse name: Not on file   Number of children: 0   Years of education: BA   Highest education level: Not on file  Occupational History   Occupation: Unemlpoyed-disabled  Tobacco Use   Smoking status: Never   Smokeless tobacco: Never  Vaping Use   Vaping Use: Never used  Substance and Sexual Activity   Alcohol use: No   Drug use: No   Sexual activity: Yes  Other Topics Concern   Not on file  Social History Narrative   Lives with mother   Caffeine use: Coke very rare   Right handed    Social Determinants of Health   Financial Resource Strain: Not on file  Food Insecurity: Not on file  Transportation Needs: Not on file  Physical Activity: Not on file  Stress: Not on file  Social Connections: Not on file    Allergies:  Allergies  Allergen Reactions   Citric Acid Other (See Comments)    Runny nose, eyes water   Food     Oranges or anything with citric acid   Gabapentin     Walking into things, hard to maintain balance, falls    Metabolic Disorder Labs: Lab Results  Component Value Date   HGBA1C 7.7 (H) 07/15/2021   MPG 123 03/02/2018   No results found for: PROLACTIN Lab Results  Component Value Date   CHOL 152 07/15/2021   TRIG 67.0 07/15/2021   HDL 61.00 07/15/2021   CHOLHDL 2 07/15/2021   VLDL 13.4 07/15/2021   LDLCALC 77 07/15/2021   LDLCALC 60 09/22/2020   Lab Results  Component Value Date   TSH 1.02 07/08/2019   TSH 0.92 03/15/2018    Therapeutic Level Labs: No results found for: LITHIUM No results found for: VALPROATE No components found for:  CBMZ  Current Medications: Current Outpatient Medications  Medication Sig Dispense Refill   ACCU-CHEK SOFTCLIX LANCETS lancets 3 (three) times daily. for testing  0   albuterol (VENTOLIN HFA) 108 (90 Base) MCG/ACT inhaler Inhale 1-2 puffs into the lungs every 6 (six) hours as needed for wheezing or shortness of breath. 18 g 1   amantadine (SYMMETREL) 100 MG capsule Take 1  capsule (100 mg total) by mouth 2 (two) times daily. 60 capsule 1   Cariprazine HCl (VRAYLAR) 6 MG CAPS TAKE 1 CAPSULE(6 MG) BY MOUTH DAILY 30 capsule 1   clonazePAM (KLONOPIN) 1 MG tablet Take 1 tablet (1 mg total) by mouth 3 (three) times daily as needed for anxiety. for anxiety 90 tablet 1   clotrimazole-betamethasone (LOTRISONE) cream Apply 1 application topically 2 (two) times daily. 45 g 0   fluPHENAZine (PROLIXIN) 5 MG tablet TAKE 1 TABLET(5 MG) BY MOUTH AT BEDTIME 30  tablet 1   fluticasone (FLONASE) 50 MCG/ACT nasal spray SHAKE LIQUID AND USE 2 SPRAYS IN EACH NOSTRIL DAILY 16 g 6   glipiZIDE (GLUCOTROL) 10 MG tablet TAKE 1 TABLET(10 MG) BY MOUTH TWICE DAILY 180 tablet 3   glucose blood (ACCU-CHEK GUIDE) test strip TEST THREE TIMES DAILY 100 strip 3   JARDIANCE 25 MG TABS tablet TAKE 1 TABLET BY MOUTH DAILY 90 tablet 0   lamoTRIgine (LAMICTAL) 100 MG tablet Take 1 tablet by mouth twice daily 180 tablet 3   LUMIGAN 0.01 % SOLN INSTILL 1 DROP INTO AFFECTED EYE ONCE AT BEDTIME     meloxicam (MOBIC) 15 MG tablet Take 1 tablet (15 mg total) by mouth daily. 30 tablet 3   metFORMIN (GLUCOPHAGE-XR) 500 MG 24 hr tablet TAKE 2 TABLETS(1000 MG) BY MOUTH TWICE DAILY 360 tablet 1   montelukast (SINGULAIR) 10 MG tablet TAKE 1 TABLET(10 MG) BY MOUTH AT BEDTIME 90 tablet 3   mupirocin ointment (BACTROBAN) 2 % Apply a thin coat to sores in left ear twice daily. 15 g 0   RESTASIS 0.05 % ophthalmic emulsion      sertraline (ZOLOFT) 100 MG tablet Take 1 tablet (100 mg total) by mouth daily. 30 tablet 1   simvastatin (ZOCOR) 20 MG tablet TAKE 1 TABLET(20 MG) BY MOUTH EVERY EVENING (Patient not taking: Reported on 07/15/2021) 90 tablet 3   tadalafil (CIALIS) 20 MG tablet TAKE ONE-HALF TO ONE TABLET BY MOUTH EVERY OTHER DAY AS NEEDED FOR ERECTILE DYSFUNCTION 10 tablet 4   triamcinolone (KENALOG) 0.1 % Apply 1 application topically 2 (two) times daily. 30 g 0   TYRVAYA 0.03 MG/ACT SOLN Spray 1 spray in each  nostril twice daily, approximately 12 hours apart     No current facility-administered medications for this visit.       Psychiatric Specialty Exam:  Review of Systems  HENT:  Positive for hearing loss.   Psychiatric/Behavioral:  The patient is nervous/anxious.   All other systems reviewed and are negative.Does not endorse   There were no vitals taken for this visit.There is no height or weight on file to calculate BMI.  General Appearance: calm, polite, no psychomotor agitation or restlessness, makes good eye contact, well groomed  Eye Contact: as above   Speech:  Normal   Volume:  Normal  Mood:  mood presents stable and affect appropriate, reactive   Affect: Appears appropriate, reactive  Thought Process:  Goal Directed/ linear   Orientation:  Full (Time, Place, and Person)  Thought Content: denies hallucinations, and does not present internally preoccupied . Chronic/persistent paranoid /persecutory ideations as reported above . States that although persistent they have decreased in intensity on current meds .  Denies SI, denies HI or violent ideations  Suicidal Thoughts:  No denies SI or self injurious ideations  Homicidal Thoughts:  No  denies HI  Memory: recent and remote grossly intact   Judgement:  Good  Insight:  Fair  Psychomotor Activity:  no restlessness or agitation, no akathisia, some intermittent mandibular movements noted during session  Concentration:  Good   Recall:  Good  Fund of Knowledge: Good  Language: Good  Akathisia:  Negative  Handed:  Right  AIMS (if indicated): NA  Assets:  Communication Skills Desire for Improvement Financial Resources/Insurance Housing  ADL's:  Intact  Cognition: WNL  Sleep:  Fair   Screenings: Camera [employer] [place] Office Visit from 12/25/2020 in [facility] from 04/06/2020 in [place]  Primary [place] from 02/26/2020 in [place] Visit  from 10/08/2019 in [facility]  PHQ-2 Total Score 1 1 3 2  0  PHQ-9 Total Score -- -- 11 -- --      [place] from 06/13/2021 in [facility] DEPT  C-SSRS RISK CATEGORY No Risk        Assessment and Plan: 55 yo single AAM with schizophrenia paranoid type vs schizoaffective disorder.   Generally stable , functioning well in daily activities, which he reports include helping take care of the house and his elderly mother, studying languages. He reports he prefers to stay at home but did go visit brother for thanksgiving and is planning on spending Christmas with family as well . Paranoid ideations are persistent, with some degree of reality testing . No hallucinations are noted or endorsed . No significant negative symptoms are noted or endorsed and presents with a full range of affect and well groomed, with full/fluent/well developed speech. He reports current medication regimen has been effective and well tolerated , and states intensity of paranoid ideations has decreased on current regimen. Side effects reviewed . His preference is to continue current regimen.   Plan:  Continue Zoloft 100 mg  QDAY for depression, anxiety Continue Vraylar 6 mg for paranoia, psychosis Continue Klonopin 1 mg TID PRN for anxiety Continue Amantadine 100 mg BID to minimize tremors /EPS  Continue Fluphenazine 5 mg QHS for paranoia , psychosis  Side effects reviewed   Next appointment in approximately in 6 weeks . Agrees to contact clinic sooner if any worsening prior or medication concerns  Plans to continue following up with his PCP for medical management Refer to individual psychotherapy     Jenne Campus, MD 07/26/2021, 10:42 AM Patient ID: [name], male   DOB: 1965/11/21, 55 y.o.   MRN: [id number]

## 2021-07-27 ENCOUNTER — Other Ambulatory Visit (HOSPITAL_COMMUNITY): Payer: Self-pay | Admitting: Psychiatry

## 2021-07-27 DIAGNOSIS — M5416 Radiculopathy, lumbar region: Secondary | ICD-10-CM | POA: Diagnosis not present

## 2021-07-27 DIAGNOSIS — R269 Unspecified abnormalities of gait and mobility: Secondary | ICD-10-CM | POA: Diagnosis not present

## 2021-07-27 DIAGNOSIS — R2681 Unsteadiness on feet: Secondary | ICD-10-CM | POA: Diagnosis not present

## 2021-07-27 DIAGNOSIS — M25652 Stiffness of left hip, not elsewhere classified: Secondary | ICD-10-CM | POA: Diagnosis not present

## 2021-07-30 DIAGNOSIS — M25652 Stiffness of left hip, not elsewhere classified: Secondary | ICD-10-CM | POA: Diagnosis not present

## 2021-07-30 DIAGNOSIS — R2681 Unsteadiness on feet: Secondary | ICD-10-CM | POA: Diagnosis not present

## 2021-07-30 DIAGNOSIS — M5416 Radiculopathy, lumbar region: Secondary | ICD-10-CM | POA: Diagnosis not present

## 2021-07-30 DIAGNOSIS — R269 Unspecified abnormalities of gait and mobility: Secondary | ICD-10-CM | POA: Diagnosis not present

## 2021-08-04 ENCOUNTER — Telehealth: Payer: Self-pay | Admitting: Radiology

## 2021-08-04 DIAGNOSIS — M5136 Other intervertebral disc degeneration, lumbar region: Secondary | ICD-10-CM

## 2021-08-04 NOTE — Telephone Encounter (Signed)
Per James-patient is coming in to see him tomorrow and Jeneen Rinks feels that he needs a repeat injection by Dr Ernestina Patches.  Jeneen Rinks will see patient, but asked that I could get referral for Dr. Ernestina Patches entered and see if he was able to get on the books for an appointment.  Referral entered. Sending to you so that you are aware of Jeneen Rinks plans.

## 2021-08-05 ENCOUNTER — Ambulatory Visit (INDEPENDENT_AMBULATORY_CARE_PROVIDER_SITE_OTHER): Payer: Medicare Other | Admitting: Surgery

## 2021-08-05 ENCOUNTER — Encounter: Payer: Self-pay | Admitting: Surgery

## 2021-08-05 ENCOUNTER — Other Ambulatory Visit: Payer: Self-pay

## 2021-08-05 DIAGNOSIS — R2681 Unsteadiness on feet: Secondary | ICD-10-CM | POA: Diagnosis not present

## 2021-08-05 DIAGNOSIS — M25652 Stiffness of left hip, not elsewhere classified: Secondary | ICD-10-CM | POA: Diagnosis not present

## 2021-08-05 DIAGNOSIS — M5416 Radiculopathy, lumbar region: Secondary | ICD-10-CM

## 2021-08-05 DIAGNOSIS — R269 Unspecified abnormalities of gait and mobility: Secondary | ICD-10-CM | POA: Diagnosis not present

## 2021-08-05 NOTE — Progress Notes (Signed)
Office Visit Note   Patient: Glenn Robertson           Date of Birth: May 04, 1966           MRN: [id number] Visit Date: 08/05/2021              Requested by: Libby Maw, MD [address] [place],  [place] [address] PCP: Libby Maw, MD   Assessment & Plan: Visit Diagnoses:  1. Radiculopathy, lumbar region     Plan: Patient continues to describe ongoing low back pain with left lower extremity radiculopathy and feeling of leg buckling.  I recommend that he follow-up with Dr. Lorin Mercy next week to discuss potential surgical intervention.  Patient would like to continue to be in very active but is finding it difficult to do so with his current problem.  All questions answered.  Follow-Up Instructions: Return in about 1 week (around 08/12/2021) for WITH DR [place].   Orders:  No orders of the defined types were placed in this encounter.  No orders of the defined types were placed in this encounter.     Procedures: No procedures performed   Clinical Data: No additional findings.   Subjective: Chief Complaint  Patient presents with   Lower Back - Pain    HPI 55 year old black male returns with complaints of chronic low back pain and left lower extremity radiculopathy.  He continues have pain that radiates into the left buttock and down his leg.  Numbness and tingling in the left leg as well.  Also feels like his left leg buckles at times when he goes to stand and with activity.  He did have a lumbar ESI with Dr. Ernestina Patches and this did not give long-term relief.   Objective: Vital Signs: There were no vitals taken for this visit.  Physical Exam Gait is normal.  No focal motor deficits. Ortho Exam  Specialty Comments:  No specialty comments available.  Imaging: No results found.   PMFS History: Patient Active Problem List   Diagnosis Date Noted   Olecranon bursitis of left elbow 05/31/2021   Allergic rhinitis  01/18/2021   Skin lesion of left ear 01/18/2021   Abrasion of left pinna 12/25/2020   Abrasion of left knee 12/25/2020   Tremor due to multiple drugs 10/02/2020   Seborrheic dermatitis 10/02/2020   Polyneuropathy 05/12/2020   Elevated cholesterol 10/08/2019   Benign prostatic hyperplasia with nocturia 10/08/2019   Schizoaffective disorder, depressive type ([place]) 11/27/2018   Urinary hesitancy 10/08/2018   Erectile dysfunction 09/24/2018   Arm numbness 08/20/2018   Transient alteration of awareness 08/08/2018   Tendinopathy of right gluteus medius 05/22/2018   Pain of right hip joint 05/08/2018   Hearing difficulty 04/13/2018   Trochanteric bursitis, right hip 03/23/2018   Reactive airway disease 03/16/2018   Headache 03/02/2018   Type 2 diabetes mellitus without complication, without long-term current use of insulin ([place]) 02/16/2018   Vitamin B 12 deficiency 02/16/2018   Healthcare maintenance 02/16/2018   Pain in left leg 10/28/2016   Lumbosacral radiculopathy at S1 03/15/2016   Lumbar degenerative disc disease 12/18/2015   Disc displacement, lumbar 04/21/2015   Chondromalacia of both patellae 02/24/2015   Paranoid schizophrenia, chronic condition ([place]) 02/24/2015   Right anterior knee pain 07/18/2014   Right ankle pain 01/27/2014   Pain in joint, ankle and foot 01/27/2014   Sciatica 01/10/2012   Disturbance of skin sensation 11/14/2011   Past Medical History:  Diagnosis  Date   Allergy    dog and cats, citrus   Anxiety    Asthma    Chronic pain    Depression    Diabetes mellitus    Glaucoma    Neuromuscular disorder ([place])    Paranoid schizophrenia ([place])     Family History  Problem Relation Age of Onset   Diabetes Father    Diabetes Paternal Uncle    Colon cancer Neg Hx    Colon polyps Neg Hx    Esophageal cancer Neg Hx    Rectal cancer Neg Hx    Stomach cancer Neg Hx     Past Surgical History:  Procedure Laterality Date   ANKLE ARTHROSCOPY     Arm Surgery   1/12   rt ulnar nerve decompression   EPIDURAL BLOCK INJECTION     several   ULNAR NERVE TRANSPOSITION  01/19/2012   Procedure: ULNAR NERVE DECOMPRESSION/TRANSPOSITION;  Surgeon: Cammie Sickle., MD;  Location: [facility];  Service: Orthopedics;  Laterality: Left;  ulnar nerve decompression vs transposition left elbow   Social History   Occupational History   Occupation: Unemlpoyed-disabled  Tobacco Use   Smoking status: Never   Smokeless tobacco: Never  Vaping Use   Vaping Use: Never used  Substance and Sexual Activity   Alcohol use: No   Drug use: No   Sexual activity: Yes

## 2021-08-10 ENCOUNTER — Ambulatory Visit (HOSPITAL_COMMUNITY): Payer: Medicare Other | Admitting: Clinical

## 2021-08-10 ENCOUNTER — Ambulatory Visit (INDEPENDENT_AMBULATORY_CARE_PROVIDER_SITE_OTHER): Payer: Medicare Other | Admitting: Orthopaedic Surgery

## 2021-08-10 ENCOUNTER — Other Ambulatory Visit: Payer: Self-pay

## 2021-08-10 ENCOUNTER — Encounter: Payer: Self-pay | Admitting: Orthopaedic Surgery

## 2021-08-10 VITALS — BP 111/77 | HR 89 | Ht 71.0 in | Wt 200.0 lb

## 2021-08-10 DIAGNOSIS — M5136 Other intervertebral disc degeneration, lumbar region: Secondary | ICD-10-CM | POA: Diagnosis not present

## 2021-08-10 NOTE — Progress Notes (Signed)
Office Visit Note   Patient: Glenn Robertson           Date of Birth: 05-29-1966           MRN: [id number] Visit Date: 08/10/2021              Requested by: Libby Maw, MD [address] [place],  [place] [address] PCP: Libby Maw, MD   Assessment & Plan: Visit Diagnoses:  1. Lumbar degenerative disc disease     Plan: Patient states she has been able to do some light weight lifting activities but cannot really run which she like to do.  We discussed trying stationary bike but he states with the psychiatric condition staying in 1 place is not something he is able to do.  No nerve root tension signs.  We reviewed his MRI scan again with copy of the report he does have some foraminal narrowing with broad-based disc protrusion contacting the left S1 nerve root.  Discogenic edema is also noted at the endplate we discussed that this may resolve with time with Modic changes.  We discussed that surgery might not guarantee that he can get back to doing her running activities.  Follow-Up Instructions: Return in about 6 weeks (around 09/21/2021).   Orders:  No orders of the defined types were placed in this encounter.  No orders of the defined types were placed in this encounter.     Procedures: No procedures performed   Clinical Data: No additional findings.   Subjective: Chief Complaint  Patient presents with   Lower Back - Pain    HPI 55 year old male returns had seen Jeneen Rinks is here for discussion about possible surgical intervention.  He has some foraminal stenosis at L5.  Previous epidural was not really helpful.  He states when he runs he can only run 50 to 100 yards.  He states 1 injection gave him relief for a week or 2.  Pain radiates into his left hip.  Due to psychiatric conditions he states he has to do activities where he is more mobile.  Patient's been treated for schizophrenia and is compliant in his treatments.  He has pain that radiates from  his back into his left hip.  Review of Systems 14 point update unchanged.   Objective: Vital Signs: BP 111/77    Pulse 89    Ht 5\' 11"  (1.803 m)    Wt 200 lb (90.7 kg)    BMI 27.89 kg/m   Physical Exam Constitutional:      Appearance: He is well-developed.  HENT:     Head: Normocephalic and atraumatic.     Right Ear: External ear normal.     Left Ear: External ear normal.  Eyes:     Pupils: Pupils are equal, round, and reactive to light.  Neck:     Thyroid: No thyromegaly.     Trachea: No tracheal deviation.  Cardiovascular:     Rate and Rhythm: Normal rate.  Pulmonary:     Effort: Pulmonary effort is normal.     Breath sounds: No wheezing.  Abdominal:     General: Bowel sounds are normal.     Palpations: Abdomen is soft.  Musculoskeletal:     Cervical back: Neck supple.  Skin:    General: Skin is warm and dry.     Capillary Refill: Capillary refill takes less than 2 seconds.  Neurological:     Mental Status: He is alert and oriented to person, place, and  time.  Psychiatric:        Behavior: Behavior normal.        Thought Content: Thought content normal.        Judgment: Judgment normal.    Ortho Exam patient gets rapidly from sitting standing normal heel toe gait.  Negative log roll to the hips negative popliteal compression test.  Specialty Comments:  No specialty comments available.  Imaging: Narrative & Impression  CLINICAL DATA:  Initial evaluation for worsening back pain with left lower extremity radiculopathy.   EXAM: MRI LUMBAR SPINE WITHOUT CONTRAST   TECHNIQUE: Multiplanar, multisequence MR imaging of the lumbar spine was performed. No intravenous contrast was administered.   COMPARISON:  Previous radiograph from 11/11/2020 as well as prior MRI from 06/15/2019.   FINDINGS: Segmentation: Standard. Lowest well-formed disc space labeled the L5-S1 level.   Alignment: Physiologic with preservation of the normal lumbar lordosis. No listhesis.    Vertebrae: Vertebral body height maintained without acute or chronic fracture. Bone marrow signal intensity within normal limits. Probable small atypical hemangioma noted within the T12 vertebral body, stable. No other discrete or worrisome osseous lesions. Discogenic reactive endplate change with marrow edema present about the L5-S1 interspace. No other abnormal marrow edema.   Conus medullaris and cauda equina: Conus extends to the L1-2 level. Conus and cauda equina appear normal.   Paraspinal and other soft tissues: Paraspinous soft tissues within normal limits. Visualized visceral structures are unremarkable.   Disc levels:   L1-2:  Unremarkable.   L2-3: Mild degenerative intervertebral disc space narrowing with disc bulge and disc desiccation. Associated mild reactive endplate spurring. No spinal stenosis. No more than mild bilateral foraminal narrowing. No impingement. Appearance is stable.   L3-4: Mild degenerative intervertebral disc space narrowing with diffuse disc bulge and disc desiccation. Minimal reactive endplate spurring. No spinal stenosis. Mild bilateral L3 foraminal narrowing without frank impingement. Appearance is stable.   L4-5: Minimal disc bulge, slightly eccentric to the left. Annular fissure at the level of the left neural foramen. No significant spinal stenosis. Mild bilateral L4 foraminal narrowing, slightly worse on the left. No frank impingement. Appearance is stable.   L5-S1: Advanced degenerative intervertebral disc space narrowing with disc desiccation and diffuse disc bulge. Associated reactive endplate change with marginal endplate osteophytic spurring and marrow edema. Superimposed broad-based central to left subarticular disc protrusion contacts the descending left S1 nerve root as it courses through the left lateral recess (series 5, image 39). No frank neural impingement. Mild epidural lipomatosis. No significant canal or lateral recess  stenosis. Moderate bilateral L5 foraminal stenosis.   IMPRESSION: 1. Broad-based central 2 left subarticular disc protrusion at L5-S1, contacting and potentially irritating the descending left S1 nerve root. 2. Moderate bilateral L5 foraminal stenosis related to disc bulge, reactive endplate change, and marrow edema. 3. Discogenic reactive endplate change with marrow edema about the L5-S1 level, which could contribute to lower back pain. 4. Additional mild noncompressive disc bulging at L2-3 through L4-5 without significant stenosis or impingement.     Electronically Signed   By: Jeannine Boga M.D.   On: 01/08/2021 04:14     PMFS History: Patient Active Problem List   Diagnosis Date Noted   Olecranon bursitis of left elbow 05/31/2021   Allergic rhinitis 01/18/2021   Skin lesion of left ear 01/18/2021   Abrasion of left pinna 12/25/2020   Abrasion of left knee 12/25/2020   Tremor due to multiple drugs 10/02/2020   Seborrheic dermatitis 10/02/2020   Polyneuropathy 05/12/2020  Elevated cholesterol 10/08/2019   Benign prostatic hyperplasia with nocturia 10/08/2019   Schizoaffective disorder, depressive type ([place]) 11/27/2018   Urinary hesitancy 10/08/2018   Erectile dysfunction 09/24/2018   Arm numbness 08/20/2018   Transient alteration of awareness 08/08/2018   Tendinopathy of right gluteus medius 05/22/2018   Pain of right hip joint 05/08/2018   Hearing difficulty 04/13/2018   Trochanteric bursitis, right hip 03/23/2018   Reactive airway disease 03/16/2018   Headache 03/02/2018   Type 2 diabetes mellitus without complication, without long-term current use of insulin ([place]) 02/16/2018   Vitamin B 12 deficiency 02/16/2018   Healthcare maintenance 02/16/2018   Pain in left leg 10/28/2016   Lumbosacral radiculopathy at S1 03/15/2016   Lumbar degenerative disc disease 12/18/2015   Disc displacement, lumbar 04/21/2015   Chondromalacia of both patellae 02/24/2015    Paranoid schizophrenia, chronic condition ([place]) 02/24/2015   Right anterior knee pain 07/18/2014   Right ankle pain 01/27/2014   Pain in joint, ankle and foot 01/27/2014   Sciatica 01/10/2012   Disturbance of skin sensation 11/14/2011   Past Medical History:  Diagnosis Date   Allergy    dog and cats, citrus   Anxiety    Asthma    Chronic pain    Depression    Diabetes mellitus    Glaucoma    Neuromuscular disorder ([place])    Paranoid schizophrenia ([place])     Family History  Problem Relation Age of Onset   Diabetes Father    Diabetes Paternal Uncle    Colon cancer Neg Hx    Colon polyps Neg Hx    Esophageal cancer Neg Hx    Rectal cancer Neg Hx    Stomach cancer Neg Hx     Past Surgical History:  Procedure Laterality Date   ANKLE ARTHROSCOPY     Arm Surgery  1/12   rt ulnar nerve decompression   EPIDURAL BLOCK INJECTION     several   ULNAR NERVE TRANSPOSITION  01/19/2012   Procedure: ULNAR NERVE DECOMPRESSION/TRANSPOSITION;  Surgeon: Cammie Sickle., MD;  Location: [facility];  Service: Orthopedics;  Laterality: Left;  ulnar nerve decompression vs transposition left elbow   Social History   Occupational History   Occupation: Unemlpoyed-disabled  Tobacco Use   Smoking status: Never   Smokeless tobacco: Never  Vaping Use   Vaping Use: Never used  Substance and Sexual Activity   Alcohol use: No   Drug use: No   Sexual activity: Yes

## 2021-08-11 ENCOUNTER — Telehealth: Payer: Self-pay | Admitting: Radiology

## 2021-08-11 NOTE — Telephone Encounter (Signed)
Patient had order for [facility] that was entered by Jeneen Rinks, but we were holding that as he came in to see you yesterday to discuss surgery. Would you like to proceed with ESI? I was not sure of your plans.

## 2021-08-11 NOTE — Telephone Encounter (Signed)
I called patient. He did not get much relief from last injection and would like to hold off on scheduling at either facility.   Shena-you can close out your referral per patient.

## 2021-08-12 ENCOUNTER — Other Ambulatory Visit: Payer: Self-pay | Admitting: Family Medicine

## 2021-08-18 ENCOUNTER — Other Ambulatory Visit: Payer: Self-pay | Admitting: Surgical

## 2021-08-18 ENCOUNTER — Ambulatory Visit (INDEPENDENT_AMBULATORY_CARE_PROVIDER_SITE_OTHER): Payer: Medicare Other | Admitting: Clinical

## 2021-08-18 ENCOUNTER — Other Ambulatory Visit: Payer: Self-pay

## 2021-08-18 DIAGNOSIS — F251 Schizoaffective disorder, depressive type: Secondary | ICD-10-CM

## 2021-08-18 DIAGNOSIS — M5136 Other intervertebral disc degeneration, lumbar region: Secondary | ICD-10-CM
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
## 2021-08-18 NOTE — Telephone Encounter (Signed)
Please advise 

## 2021-08-18 NOTE — Telephone Encounter (Signed)
Glenn Robertson pt

## 2021-08-31 ENCOUNTER — Other Ambulatory Visit: Payer: Self-pay

## 2021-08-31 ENCOUNTER — Ambulatory Visit (INDEPENDENT_AMBULATORY_CARE_PROVIDER_SITE_OTHER): Payer: Medicare Other | Admitting: Clinical

## 2021-08-31 DIAGNOSIS — F251 Schizoaffective disorder, depressive type: Secondary | ICD-10-CM

## 2021-08-31 NOTE — Progress Notes (Signed)
° °  THERAPIST PROGRESS NOTE  Session Time: 9am  Participation Level: Active  Behavioral Response: CasualAlertpleasant  Type of Therapy: Individual Therapy  Treatment Goals addressed: Coping  Interventions: Supportive Virtual Visit via Video Note  I connected with Glenn Robertson on 08/31/21 at  9:00 AM EST by a video enabled telemedicine application and verified that I am speaking with the correct person using two identifiers.  Location: Patient: home Provider: office   I discussed the limitations of evaluation and management by telemedicine and the availability of in person appointments. The patient expressed understanding and agreed to proceed.   I discussed the assessment and treatment plan with the patient. The patient was provided an opportunity to ask questions and all were answered. The patient agreed with the plan and demonstrated an understanding of the instructions.   The patient was advised to call back or seek an in-person evaluation if the symptoms worsen or if the condition fails to improve as anticipated.  I provided 45 minutes of non-face-to-face time during this encounter.  Summary: Pt reports hx of paranoia. Pt discussed stalkers following him when he is alone. Per pt report paranoia more prevalent during periods of increased stress. 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 Pt states having poor self care but says he would like to work toward improving this. According to pt, physical health problems have limited his mobility. Pt says in the past he enjoyed biking, hiking, running and other outdoor activities. Pt reports he is enrolled to begin a spanish class on 1/9 and says he enjoys learning new languages.  Suicidal/Homicidal: Pt denies SI/HI, no AH/VH reported.  Therapist Response: Todays session consisted of discussing the 8 dimensions of wellness. CSW probed for feedback on how pt is progressing in each area. Pt identifies areas of improvement  in each category. CSW discussed with pt importance of creating a self care routine and assisted him in identifying hobbies he can start to incorporate into his routine. Processed with pt how self care routine can assist with improving overall health and wellness.  Plan: Return again in 1 weeks.  Diagnosis: Axis I: Schizoaffective disorder, depressed type    Axis II: No diagnosis    Yvette Rack, LCSW 08/31/2021

## 2021-09-06 ENCOUNTER — Telehealth (HOSPITAL_COMMUNITY): Payer: Medicare Other | Admitting: Psychiatry

## 2021-09-07 ENCOUNTER — Ambulatory Visit (INDEPENDENT_AMBULATORY_CARE_PROVIDER_SITE_OTHER): Payer: Medicare Other | Admitting: Clinical

## 2021-09-07 ENCOUNTER — Telehealth: Payer: Self-pay | Admitting: Physical Medicine and Rehabilitation

## 2021-09-07 ENCOUNTER — Encounter (HOSPITAL_COMMUNITY): Payer: Self-pay | Admitting: Psychiatry

## 2021-09-07 ENCOUNTER — Other Ambulatory Visit (HOSPITAL_COMMUNITY): Payer: Self-pay | Admitting: Psychiatry

## 2021-09-07 ENCOUNTER — Other Ambulatory Visit: Payer: Self-pay

## 2021-09-07 ENCOUNTER — Telehealth (HOSPITAL_BASED_OUTPATIENT_CLINIC_OR_DEPARTMENT_OTHER): Payer: Medicare Other | Admitting: Psychiatry

## 2021-09-07 DIAGNOSIS — F251 Schizoaffective disorder, depressive type: Secondary | ICD-10-CM | POA: Diagnosis not present

## 2021-09-07 DIAGNOSIS — R404 Transient alteration of awareness: Secondary | ICD-10-CM | POA: Diagnosis not present

## 2021-09-07 MED ORDER — AMANTADINE HCL 100 MG PO CAPS: 100.0000 mg | ORAL_CAPSULE | Freq: Two times a day (BID) | ORAL | 1 refills | Status: DC

## 2021-09-07 MED ORDER — VRAYLAR 6 MG PO CAPS: ORAL_CAPSULE | ORAL | 1 refills | Status: DC

## 2021-09-07 MED ORDER — CLONAZEPAM 1 MG PO TABS: 1.0000 mg | ORAL_TABLET | Freq: Three times a day (TID) | ORAL | 1 refills | Status: DC | PRN

## 2021-09-07 MED ORDER — SERTRALINE HCL 100 MG PO TABS: 100.0000 mg | ORAL_TABLET | Freq: Every day | ORAL | 1 refills | Status: DC

## 2021-09-07 MED ORDER — FLUPHENAZINE HCL 5 MG PO TABS: ORAL_TABLET | ORAL | 1 refills | Status: DC

## 2021-09-07 NOTE — Telephone Encounter (Signed)
Patient called advised the only day he is available for the injection is 09/20/2021.  The number to contact patient is [phone]
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
## 2021-09-08 ENCOUNTER — Other Ambulatory Visit: Payer: Self-pay | Admitting: Physical Medicine and Rehabilitation

## 2021-09-08 DIAGNOSIS — M5116 Intervertebral disc disorders with radiculopathy, lumbar region: Secondary | ICD-10-CM

## 2021-09-21 ENCOUNTER — Ambulatory Visit (INDEPENDENT_AMBULATORY_CARE_PROVIDER_SITE_OTHER): Payer: Medicare Other | Admitting: Orthopaedic Surgery

## 2021-09-21 ENCOUNTER — Ambulatory Visit (INDEPENDENT_AMBULATORY_CARE_PROVIDER_SITE_OTHER): Payer: Medicare Other

## 2021-09-21 ENCOUNTER — Encounter: Payer: Self-pay | Admitting: Orthopaedic Surgery

## 2021-09-21 ENCOUNTER — Other Ambulatory Visit: Payer: Self-pay

## 2021-09-21 VITALS — BP 105/71 | HR 91 | Ht 71.0 in | Wt 200.0 lb

## 2021-09-21 DIAGNOSIS — Z0000 Encounter for general adult medical examination without abnormal findings: Secondary | ICD-10-CM

## 2021-09-21 DIAGNOSIS — M5136 Other intervertebral disc degeneration, lumbar region: Secondary | ICD-10-CM

## 2021-09-21 DIAGNOSIS — M5126 Other intervertebral disc displacement, lumbar region: Secondary | ICD-10-CM | POA: Diagnosis not present

## 2021-09-21 NOTE — Patient Instructions (Signed)
[name] , Thank you for taking time to come for your Medicare Wellness Visit. I appreciate your ongoing commitment to your health goals. Please review the following plan we discussed and let me know if I can assist you in the future.   Screening recommendations/referrals: Colonoscopy: 09/04/2019 Recommended yearly ophthalmology/optometry visit for glaucoma screening and checkup Recommended yearly dental visit for hygiene and checkup  Vaccinations: Influenza vaccine: completed  Pneumococcal vaccine: completed  Tdap vaccine: 06/28/2019 Shingles vaccine: will consider     Advanced directives: none   Conditions/risks identified: none   Next appointment: none   Preventive Care 40-64 Years, Male Preventive care refers to lifestyle choices and visits with your health care provider that can promote health and wellness. What does preventive care include? A yearly physical exam. This is also called an annual well check. Dental exams once or twice a year. Routine eye exams. Ask your health care provider how often you should have your eyes checked. Personal lifestyle choices, including: Daily care of your teeth and gums. Regular physical activity. Eating a healthy diet. Avoiding tobacco and drug use. Limiting alcohol use. Practicing safe sex. Taking low-dose aspirin every day starting at age 63. What happens during an annual well check? The services and screenings done by your health care provider during your annual well check will depend on your age, overall health, lifestyle risk factors, and family history of disease. Counseling  Your health care provider may ask you questions about your: Alcohol use. Tobacco use. Drug use. Emotional well-being. Home and relationship well-being. Sexual activity. Eating habits. Work and work Statistician. Screening  You may have the following tests or measurements: Height, weight, and BMI. Blood pressure. Lipid and cholesterol levels. These  may be checked every 5 years, or more frequently if you are over 32 years old. Skin check. Lung cancer screening. You may have this screening every year starting at age 69 if you have a 30-pack-year history of smoking and currently smoke or have quit within the past 15 years. Fecal occult blood test (FOBT) of the stool. You may have this test every year starting at age 18. Flexible sigmoidoscopy or colonoscopy. You may have a sigmoidoscopy every 5 years or a colonoscopy every 10 years starting at age 103. Prostate cancer screening. Recommendations will vary depending on your family history and other risks. Hepatitis C blood test. Hepatitis B blood test. Sexually transmitted disease (STD) testing. Diabetes screening. This is done by checking your blood sugar (glucose) after you have not eaten for a while (fasting). You may have this done every 1-3 years. Discuss your test results, treatment options, and if necessary, the need for more tests with your health care provider. Vaccines  Your health care provider may recommend certain vaccines, such as: Influenza vaccine. This is recommended every year. Tetanus, diphtheria, and acellular pertussis (Tdap, Td) vaccine. You may need a Td booster every 10 years. Zoster vaccine. You may need this after age 48. Pneumococcal 13-valent conjugate (PCV13) vaccine. You may need this if you have certain conditions and have not been vaccinated. Pneumococcal polysaccharide (PPSV23) vaccine. You may need one or two doses if you smoke cigarettes or if you have certain conditions. Talk to your health care provider about which screenings and vaccines you need and how often you need them. This information is not intended to replace advice given to you by your health care provider. Make sure you discuss any questions you have with your health care provider. Document Released: 09/11/2015 Document Revised: 05/04/2016  Document Reviewed: 06/16/2015 Elsevier Interactive Patient  Education  2017 [place] Prevention in the Home Falls can cause injuries. They can happen to people of all ages. There are many things you can do to make your home safe and to help prevent falls. What can I do on the outside of my home? Regularly fix the edges of walkways and driveways and fix any cracks. Remove anything that might make you trip as you walk through a door, such as a raised step or threshold. Trim any bushes or trees on the path to your home. Use bright outdoor lighting. Clear any walking paths of anything that might make someone trip, such as rocks or tools. Regularly check to see if handrails are loose or broken. Make sure that both sides of any steps have handrails. Any raised decks and porches should have guardrails on the edges. Have any leaves, snow, or ice cleared regularly. Use sand or salt on walking paths during winter. Clean up any spills in your garage right away. This includes oil or grease spills. What can I do in the bathroom? Use night lights. Install grab bars by the toilet and in the tub and shower. Do not use towel bars as grab bars. Use non-skid mats or decals in the tub or shower. If you need to sit down in the shower, use a plastic, non-slip stool. Keep the floor dry. Clean up any water that spills on the floor as soon as it happens. Remove soap buildup in the tub or shower regularly. Attach bath mats securely with double-sided non-slip rug tape. Do not have throw rugs and other things on the floor that can make you trip. What can I do in the bedroom? Use night lights. Make sure that you have a light by your bed that is easy to reach. Do not use any sheets or blankets that are too big for your bed. They should not hang down onto the floor. Have a firm chair that has side arms. You can use this for support while you get dressed. Do not have throw rugs and other things on the floor that can make you trip. What can I do in the  kitchen? Clean up any spills right away. Avoid walking on wet floors. Keep items that you use a lot in easy-to-reach places. If you need to reach something above you, use a strong step stool that has a grab bar. Keep electrical cords out of the way. Do not use floor polish or wax that makes floors slippery. If you must use wax, use non-skid floor wax. Do not have throw rugs and other things on the floor that can make you trip. What can I do with my stairs? Do not leave any items on the stairs. Make sure that there are handrails on both sides of the stairs and use them. Fix handrails that are broken or loose. Make sure that handrails are as long as the stairways. Check any carpeting to make sure that it is firmly attached to the stairs. Fix any carpet that is loose or worn. Avoid having throw rugs at the top or bottom of the stairs. If you do have throw rugs, attach them to the floor with carpet tape. Make sure that you have a light switch at the top of the stairs and the bottom of the stairs. If you do not have them, ask someone to add them for you. What else can I do to help prevent falls? Wear shoes that:  Do not have high heels. Have rubber bottoms. Are comfortable and fit you well. Are closed at the toe. Do not wear sandals. If you use a stepladder: Make sure that it is fully opened. Do not climb a closed stepladder. Make sure that both sides of the stepladder are locked into place. Ask someone to hold it for you, if possible. Clearly mark and make sure that you can see: Any grab bars or handrails. First and last steps. Where the edge of each step is. Use tools that help you move around (mobility aids) if they are needed. These include: Canes. Walkers. Scooters. Crutches. Turn on the lights when you go into a dark area. Replace any light bulbs as soon as they burn out. Set up your furniture so you have a clear path. Avoid moving your furniture around. If any of your floors are  uneven, fix them. If there are any pets around you, be aware of where they are. Review your medicines with your doctor. Some medicines can make you feel dizzy. This can increase your chance of falling. Ask your doctor what other things that you can do to help prevent falls. This information is not intended to replace advice given to you by your health care provider. Make sure you discuss any questions you have with your health care provider. Document Released: 06/11/2009 Document Revised: 01/21/2016 Document Reviewed: 09/19/2014 Elsevier Interactive Patient Education  2017 [employer].

## 2021-09-21 NOTE — Progress Notes (Signed)
Office Visit Note   Patient: Glenn Robertson           Date of Birth: [birth year]           MRN: [id number] Visit Date: 09/21/2021              Requested by: Libby Maw, MD [address] [place],  [place] [address] PCP: Libby Maw, MD   Assessment & Plan: Visit Diagnoses:  1. Lumbar degenerative disc disease   2. Disc displacement, lumbar     Plan: Patient scheduled for upcoming lumbar epidural.  Reviewed plain radiographs and previous MRI scan that showed endplate marrow edema problems osteophyte formation narrowing the disc space L5-S1 with left subarticular disc protrusion on the left side lateral recess narrowing.  He has some foraminal stenosis no symptoms on the right currently.  Hopefully get good relief sustained with the injection and then can resume as running activities.  Follow-Up Instructions: No follow-ups on file.   Orders:  No orders of the defined types were placed in this encounter.  No orders of the defined types were placed in this encounter.     Procedures: No procedures performed   Clinical Data: No additional findings.   Subjective: Chief Complaint  Patient presents with   Lower Back - Follow-up    HPI 56 year old male returns with ongoing problems with pain in his back that prevents him from running.  He states at times he has been able to run for up to 2 months but then has increased symptoms with running primarily on the left side.  An epidural injection in the past that gave him some relief he is scheduled for another and is concerned that the relief may be short-lived.   Review of Systems positive for type 2 diabetes not on insulin schizophrenia affective disorder with depression type.  Knee chondromalacia.  All other systems noncontributory HPI.   Objective: Vital Signs: BP 105/71    Pulse 91    Ht 5\' 11"  (1.803 m)    Wt 200 lb (90.7 kg)    BMI 27.89 kg/m   Physical Exam Constitutional:      Appearance: He  is well-developed.  HENT:     Head: Normocephalic and atraumatic.     Right Ear: External ear normal.     Left Ear: External ear normal.  Eyes:     Pupils: Pupils are equal, round, and reactive to light.  Neck:     Thyroid: No thyromegaly.     Trachea: No tracheal deviation.  Cardiovascular:     Rate and Rhythm: Normal rate.  Pulmonary:     Effort: Pulmonary effort is normal.     Breath sounds: No wheezing.  Abdominal:     General: Bowel sounds are normal.     Palpations: Abdomen is soft.  Musculoskeletal:     Cervical back: Neck supple.  Skin:    General: Skin is warm and dry.     Capillary Refill: Capillary refill takes less than 2 seconds.  Neurological:     Mental Status: He is alert and oriented to person, place, and time.  Psychiatric:        Behavior: Behavior normal.        Thought Content: Thought content normal.        Judgment: Judgment normal.    Ortho Exam normal heel toe gait no limping.  Has some discomfort over his left trochanter when he is in the lateral decubitus position on the  left none on the right.  Negative Faber negative straight leg raising 90 degrees.  Specialty Comments:  No specialty comments available.  Imaging: No results found.   PMFS History: Patient Active Problem List   Diagnosis Date Noted   Olecranon bursitis of left elbow 05/31/2021   Allergic rhinitis 01/18/2021   Skin lesion of left ear 01/18/2021   Abrasion of left pinna 12/25/2020   Abrasion of left knee 12/25/2020   Tremor due to multiple drugs 10/02/2020   Seborrheic dermatitis 10/02/2020   Polyneuropathy 05/12/2020   Elevated cholesterol 10/08/2019   Benign prostatic hyperplasia with nocturia 10/08/2019   Schizoaffective disorder, depressive type ([place]) 11/27/2018   Urinary hesitancy 10/08/2018   Erectile dysfunction 09/24/2018   Arm numbness 08/20/2018   Transient alteration of awareness 08/08/2018   Tendinopathy of right gluteus medius 05/22/2018   Pain of right  hip joint 05/08/2018   Hearing difficulty 04/13/2018   Trochanteric bursitis, right hip 03/23/2018   Reactive airway disease 03/16/2018   Headache 03/02/2018   Type 2 diabetes mellitus without complication, without long-term current use of insulin ([place]) 02/16/2018   Vitamin B 12 deficiency 02/16/2018   Healthcare maintenance 02/16/2018   Pain in left leg 10/28/2016   Lumbosacral radiculopathy at S1 03/15/2016   Lumbar degenerative disc disease 12/18/2015   Disc displacement, lumbar 04/21/2015   Chondromalacia of both patellae 02/24/2015   Paranoid schizophrenia, chronic condition ([place]) 02/24/2015   Right anterior knee pain 07/18/2014   Right ankle pain 01/27/2014   Pain in joint, ankle and foot 01/27/2014   Sciatica 01/10/2012   Disturbance of skin sensation 11/14/2011   Past Medical History:  Diagnosis Date   Allergy    dog and cats, citrus   Anxiety    Asthma    Chronic pain    Depression    Diabetes mellitus    Glaucoma    Neuromuscular disorder ([place])    Paranoid schizophrenia ([place])     Family History  Problem Relation Age of Onset   Diabetes Father    Diabetes Paternal Uncle    Colon cancer Neg Hx    Colon polyps Neg Hx    Esophageal cancer Neg Hx    Rectal cancer Neg Hx    Stomach cancer Neg Hx     Past Surgical History:  Procedure Laterality Date   ANKLE ARTHROSCOPY     Arm Surgery  1/12   rt ulnar nerve decompression   EPIDURAL BLOCK INJECTION     several   ULNAR NERVE TRANSPOSITION  01/19/2012   Procedure: ULNAR NERVE DECOMPRESSION/TRANSPOSITION;  Surgeon: Cammie Sickle., MD;  Location: [facility];  Service: Orthopedics;  Laterality: Left;  ulnar nerve decompression vs transposition left elbow   Social History   Occupational History   Occupation: Unemlpoyed-disabled  Tobacco Use   Smoking status: Never   Smokeless tobacco: Never  Vaping Use   Vaping Use: Never used  Substance and Sexual Activity   Alcohol use: No   Drug use:  No   Sexual activity: Yes

## 2021-09-21 NOTE — Progress Notes (Signed)
Subjective:   Glenn Robertson is a 56 y.o. male who presents for an Subsequent  Medicare Annual Wellness Visit.  Virtual Visit via Video Note  I connected with Glenn Robertson by a video enabled telemedicine application and verified that I am speaking with the correct person using two identifiers.  Location: Patient: Home Provider: Office Persons participating in the virtual visit: patient, provider   I discussed the limitations of evaluation and management by telemedicine and the availability of in person appointments. The patient expressed understanding and agreed to proceed.     Mickel Baas Caily Rakers,LPN  Review of Systems     Cardiac Risk Factors include: advanced age (>24men, >69 women);diabetes mellitus;dyslipidemia;male gender;hypertension     Objective:    Today's Vitals   There is no height or weight on file to calculate BMI.  Advanced Directives 09/21/2021 06/13/2021 02/26/2020 05/09/2018 06/15/2017 12/14/2016 11/03/2016  Does Patient Have a Medical Advance Directive? No No No No No No No  Would patient like information on creating a medical advance directive? No - Patient declined Yes (ED - Information included in AVS) Yes (ED - Information included in AVS) No - Patient declined - No - Patient declined -    Current Medications (verified) Outpatient Encounter Medications as of 09/21/2021  Medication Sig   ACCU-CHEK SOFTCLIX LANCETS lancets 3 (three) times daily. for testing   albuterol (VENTOLIN HFA) 108 (90 Base) MCG/ACT inhaler Inhale 1-2 puffs into the lungs every 6 (six) hours as needed for wheezing or shortness of breath.   amantadine (SYMMETREL) 100 MG capsule Take 1 capsule (100 mg total) by mouth 2 (two) times daily.   Cariprazine HCl (VRAYLAR) 6 MG CAPS TAKE 1 CAPSULE(6 MG) BY MOUTH DAILY   clonazePAM (KLONOPIN) 1 MG tablet Take 1 tablet (1 mg total) by mouth 3 (three) times daily as needed for anxiety. for anxiety   clotrimazole-betamethasone (LOTRISONE) cream Apply 1  application topically 2 (two) times daily.   fluPHENAZine (PROLIXIN) 5 MG tablet TAKE 1 TABLET(5 MG) BY MOUTH AT BEDTIME   fluticasone (FLONASE) 50 MCG/ACT nasal spray SHAKE LIQUID AND USE 2 SPRAYS IN EACH NOSTRIL DAILY   glipiZIDE (GLUCOTROL) 10 MG tablet TAKE 1 TABLET(10 MG) BY MOUTH TWICE DAILY   glucose blood (ACCU-CHEK GUIDE) test strip TEST THREE TIMES DAILY   JARDIANCE 25 MG TABS tablet TAKE 1 TABLET BY MOUTH DAILY   lamoTRIgine (LAMICTAL) 100 MG tablet Take 1 tablet by mouth twice daily   LUMIGAN 0.01 % SOLN INSTILL 1 DROP INTO AFFECTED EYE ONCE AT BEDTIME   meloxicam (MOBIC) 15 MG tablet TAKE 1 TABLET(15 MG) BY MOUTH DAILY   metFORMIN (GLUCOPHAGE-XR) 500 MG 24 hr tablet TAKE 2 TABLETS(1000 MG) BY MOUTH TWICE DAILY   montelukast (SINGULAIR) 10 MG tablet TAKE 1 TABLET(10 MG) BY MOUTH AT BEDTIME   sertraline (ZOLOFT) 100 MG tablet Take 1 tablet (100 mg total) by mouth daily.   simvastatin (ZOCOR) 20 MG tablet TAKE 1 TABLET(20 MG) BY MOUTH EVERY EVENING   tadalafil (CIALIS) 20 MG tablet TAKE ONE-HALF TO ONE TABLET BY MOUTH EVERY OTHER DAY AS NEEDED FOR ERECTILE DYSFUNCTION   TYRVAYA 0.03 MG/ACT SOLN Spray 1 spray in each nostril twice daily, approximately 12 hours apart   mupirocin ointment (BACTROBAN) 2 % Apply a thin coat to sores in left ear twice daily. (Patient not taking: Reported on 09/21/2021)   RESTASIS 0.05 % ophthalmic emulsion  (Patient not taking: Reported on 09/21/2021)   triamcinolone (KENALOG) 0.1 % Apply 1 application topically  2 (two) times daily. (Patient not taking: Reported on 09/21/2021)   [DISCONTINUED] pregabalin (LYRICA) 100 MG capsule Take 100 mg by mouth daily.   No facility-administered encounter medications on file as of 09/21/2021.    Allergies (verified) Citric acid, Food, and Gabapentin   History: Past Medical History:  Diagnosis Date   Allergy    dog and cats, citrus   Anxiety    Asthma    Chronic pain    Depression    Diabetes mellitus     Glaucoma    Neuromuscular disorder ([place])    Paranoid schizophrenia ([place])    Past Surgical History:  Procedure Laterality Date   ANKLE ARTHROSCOPY     Arm Surgery  1/12   rt ulnar nerve decompression   EPIDURAL BLOCK INJECTION     several   ULNAR NERVE TRANSPOSITION  01/19/2012   Procedure: ULNAR NERVE DECOMPRESSION/TRANSPOSITION;  Surgeon: Cammie Sickle., MD;  Location: [facility];  Service: Orthopedics;  Laterality: Left;  ulnar nerve decompression vs transposition left elbow   Family History  Problem Relation Age of Onset   Diabetes Father    Diabetes Paternal Uncle    Colon cancer Neg Hx    Colon polyps Neg Hx    Esophageal cancer Neg Hx    Rectal cancer Neg Hx    Stomach cancer Neg Hx    Social History   Socioeconomic History   Marital status: Single    Spouse name: Not on file   Number of children: 0   Years of education: BA   Highest education level: Not on file  Occupational History   Occupation: Unemlpoyed-disabled  Tobacco Use   Smoking status: Never   Smokeless tobacco: Never  Vaping Use   Vaping Use: Never used  Substance and Sexual Activity   Alcohol use: No   Drug use: No   Sexual activity: Yes  Other Topics Concern   Not on file  Social History Narrative   Lives with mother   Caffeine use: Coke very rare   Right handed    Social Determinants of Health   Financial Resource Strain: Low Risk    Difficulty of Paying Living Expenses: Not hard at all  Food Insecurity: No Food Insecurity   Worried About Charity fundraiser in the Last Year: Never true   Ran Out of Food in the Last Year: Never true  Transportation Needs: No Transportation Needs   Lack of Transportation (Medical): No   Lack of Transportation (Non-Medical): No  Physical Activity: Insufficiently Active   Days of Exercise per Week: 1 day   Minutes of Exercise per Session: 20 min  Stress: No Stress Concern Present   Feeling of Stress : Not at all  Social  Connections: Socially Isolated   Frequency of Communication with Friends and Family: Three times a week   Frequency of Social Gatherings with Friends and Family: Three times a week   Attends Religious Services: Never   Active Member of Clubs or Organizations: No   Attends Music therapist: Not on file   Marital Status: Never married    Tobacco Counseling Counseling given: Not Answered   Clinical Intake:  Pre-visit preparation completed: Yes  Pain : No/denies pain     Nutritional Risks: None Diabetes: Yes CBG done?: No Did pt. bring in CBG monitor from home?: No  How often do you need to have someone help you when you read instructions, pamphlets, or other written materials from  your doctor or pharmacy?: 1 - Never What is the last grade level you completed in school?: BA  Diabetic?yes   Nutrition Risk Assessment:  Has the patient had any N/V/D within the last 2 months?  No  Does the patient have any non-healing wounds?  No  Has the patient had any unintentional weight loss or weight gain?  No   Diabetes:  Is the patient diabetic?  Yes  If diabetic, was a CBG obtained today?  No  Did the patient bring in their glucometer from home?  No  How often do you monitor your CBG's? 3 x day .   Financial Strains and Diabetes Management:  Are you having any financial strains with the device, your supplies or your medication? No .  Does the patient want to be seen by Chronic Care Management for management of their diabetes?  No  Would the patient like to be referred to a Nutritionist or for Diabetic Management?  No   Diabetic Exams:  Diabetic Eye Exam: Completed scheduled 09/24/2021 Diabetic Foot Exam: Overdue, Pt has been advised about the importance in completing this exam. Pt is scheduled for diabetic foot exam on next office visit .   Interpreter Needed?: No  Information entered by :: [facility] of Daily Living In your present state of  health, do you have any difficulty performing the following activities: 09/21/2021  Hearing? N  Vision? N  Difficulty concentrating or making decisions? N  Walking or climbing stairs? N  Dressing or bathing? N  Doing errands, shopping? N  Preparing Food and eating ? N  Using the Toilet? N  In the past six months, have you accidently leaked urine? N  Do you have problems with loss of bowel control? N  Managing your Medications? N  Managing your Finances? N  Housekeeping or managing your Housekeeping? N  Some recent data might be hidden    Patient Care Team: Libby Maw, MD as PCP - General (Family Medicine)  Indicate any recent Medical Services you may have received from other than [facility] providers in the past year (date may be approximate).     Assessment:   This is a routine wellness examination for Glenn Robertson.  Hearing/Vision screen No results found.  Dietary issues and exercise activities discussed: Current Exercise Habits: Home exercise routine, Type of exercise: walking, Time (Minutes): 30, Frequency (Times/Week): 1, Weekly Exercise (Minutes/Week): 30, Intensity: Mild, Exercise limited by: orthopedic condition(s)   Goals Addressed             This Visit's Progress    Patient Stated   On track    Increase exercise & lose weight       Depression Screen PHQ 2/9 Scores 09/21/2021 09/21/2021 12/25/2020 04/06/2020 02/26/2020 10/08/2019 05/09/2018  PHQ - 2 Score 0 0 1 1 3 2  0  PHQ- 9 Score - - - - 11 - -  Exception Documentation - - - - - - -  Some encounter information is confidential and restricted. Go to Review Flowsheets activity to see all data.    Fall Risk Fall Risk  09/21/2021 12/25/2020 04/06/2020 02/26/2020 10/08/2019  Falls in the past year? 0 0 1 1 1   Number falls in past yr: 0 - 1 0 0  Injury with Fall? 0 - 1 1 1   Comment - - stitches on forehead injury in Oct. 2020 black eye had stitches from fall above right eye  Risk for fall due to : - - - - -  Follow up  Falls evaluation completed - - Falls prevention discussed -  Comment - - - - -    FALL RISK PREVENTION PERTAINING TO THE HOME:  Any stairs in or around the home? No  If so, are there any without handrails? No  Home free of loose throw rugs in walkways, pet beds, electrical cords, etc? Yes  Adequate lighting in your home to reduce risk of falls? Yes   ASSISTIVE DEVICES UTILIZED TO PREVENT FALLS:  Life alert? No  Use of a cane, walker or w/c? No  Grab bars in the bathroom? No  Shower chair or bench in shower? Yes  Elevated toilet seat or a handicapped toilet? No     Cognitive Function:Normal cognitive status assessed by direct observation by this Nurse Health Advisor. No abnormalities found.       6CIT Screen 02/26/2020  What Year? 0 points  What month? 0 points  What time? 0 points  Count back from 20 0 points  Months in reverse 0 points  Repeat phrase 0 points  Total Score 0    Immunizations Immunization History  Administered Date(s) Administered   Influenza Inj Mdck Quad With Preservative 06/09/2018   Influenza,inj,Quad PF,6+ Mos 05/19/2017, 04/25/2019   Influenza-Unspecified 06/09/2018, 06/07/2021   PFIZER(Purple Top)SARS-COV-2 Vaccination 11/12/2019, 12/04/2019, 07/02/2020   Pneumococcal Conjugate-13 03/23/2017   Tdap 06/28/2019    TDAP status: Up to date  Flu Vaccine status: Up to date  Pneumococcal vaccine status: Up to date  Covid-19 vaccine status: Completed vaccines  Qualifies for Shingles Vaccine? Yes   Zostavax completed No   Shingrix Completed?: No.    Education has been provided regarding the importance of this vaccine. Patient has been advised to call insurance company to determine out of pocket expense if they have not yet received this vaccine. Advised may also receive vaccine at local pharmacy or Health Dept. Verbalized acceptance and understanding.  Screening Tests Health Maintenance  Topic Date Due   Zoster Vaccines- Shingrix (1 of 2)  Never done   COVID-19 Vaccine (4 - Booster for Pfizer series) 08/27/2020   OPHTHALMOLOGY EXAM  03/19/2021   HEMOGLOBIN A1C  01/12/2022   FOOT EXAM  07/15/2022   URINE MICROALBUMIN  07/15/2022   TETANUS/TDAP  06/27/2029   COLONOSCOPY (Pts 45-53yrs Insurance coverage will need to be confirmed)  09/03/2029   INFLUENZA VACCINE  Completed   Hepatitis C Screening  Completed   HIV Screening  Completed   HPV VACCINES  Aged Out    Health Maintenance  Health Maintenance Due  Topic Date Due   Zoster Vaccines- Shingrix (1 of 2) Never done   COVID-19 Vaccine (4 - Booster for Pfizer series) 08/27/2020   OPHTHALMOLOGY EXAM  03/19/2021    Colorectal cancer screening: Type of screening: Colonoscopy. Completed 09/04/2019. Repeat every 10 years  Lung Cancer Screening: (Low Dose CT Chest recommended if Age 56-80 years, 30 pack-year currently smoking OR have quit w/in 15years.) does not qualify.   Lung Cancer Screening Referral: n/a  Additional Screening:  Hepatitis C Screening: does not qualify;   Vision Screening: Recommended annual ophthalmology exams for early detection of glaucoma and other disorders of the eye. Is the patient up to date with their annual eye exam?  Yes  Who is the provider or what is the name of the office in which the patient attends annual eye exams? Dr.Whittiaker  If pt is not established with a provider, would they like to be referred to a provider to establish care?  No .   Dental Screening: Recommended annual dental exams for proper oral hygiene  Community Resource Referral / Chronic Care Management: CRR required this visit?  No   CCM required this visit?  No      Plan:     I have personally reviewed and noted the following in the patients chart:   Medical and social history Use of alcohol, tobacco or illicit drugs  Current medications and supplements including opioid prescriptions. Patient is not currently taking opioid prescriptions. Functional ability  and status Nutritional status Physical activity Advanced directives List of other physicians Hospitalizations, surgeries, and ER visits in previous 12 months Vitals Screenings to include cognitive, depression, and falls Referrals and appointments  In addition, I have reviewed and discussed with patient certain preventive protocols, quality metrics, and best practice recommendations. A written personalized care plan for preventive services as well as general preventive health recommendations were provided to patient.     Randel Pigg, LPN   6/83/7290   Nurse Notes: none

## 2021-09-22 ENCOUNTER — Ambulatory Visit (INDEPENDENT_AMBULATORY_CARE_PROVIDER_SITE_OTHER): Payer: Medicare Other | Admitting: Clinical

## 2021-09-22 DIAGNOSIS — F251 Schizoaffective disorder, depressive type: Secondary | ICD-10-CM

## 2021-09-22 NOTE — Progress Notes (Signed)
° °  THERAPIST PROGRESS NOTE  Session Time: 2pm  Participation Level: Active  Behavioral Response: CasualAlertpleasant  Type of Therapy: Individual Therapy  Treatment Goals addressed: Coping  Interventions: Supportive Virtual Visit via Video Note  I connected with Glenn Robertson on 09/22/21 at  2:00 PM EST by a video enabled telemedicine application and verified that I am speaking with the correct person using two identifiers.  Location: Patient: home  Provider: office   I discussed the limitations of evaluation and management by telemedicine and the availability of in person appointments. The patient expressed understanding and agreed to proceed.   I discussed the assessment and treatment plan with the patient. The patient was provided an opportunity to ask questions and all were answered. The patient agreed with the plan and demonstrated an understanding of the instructions.   The patient was advised to call back or seek an in-person evaluation if the symptoms worsen or if the condition fails to improve as anticipated.  I provided 40 minutes of non-face-to-face time during this encounter.   Summary:  pt presents in pleasant mood. Pt reports his spanish class is going well and says he is teaching himself french. 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 Pt says his activity level decreased during the pandemic but is able to create a routine again. Pt discussed challenges with junk food intake. Pt acknowledges junk food intake increased as his activity level decreased.  Suicidal/Homicidal:  Pt denies SI/HI, no AH/VH reported.  Therapist Response: Assessed for changes in mood, behavior and daily functioning. Processed with pt reframing negative thinking. Actively listened and supported pt as he discussed difficulty with transitions. Pt reports having a back problem that prevents him from going on runs. Pt says he is use to being able to run long  distances and having some difficulty adjusting to the change. Discussed with pt incorporating other exercises into his routine such as bike riding, which he reports being disappointed because he is unable to ride for 16 miles. Highlighted pt strength, ability to bike for 10 miles with minimal pain.  Plan: Return again in 3 weeks.  Diagnosis: Axis I: Schizoaffective disorder, depressed type    Axis II: No diagnosis    Yvette Rack, LCSW 09/22/2021

## 2021-09-23 DIAGNOSIS — H43813 Vitreous degeneration, bilateral: Secondary | ICD-10-CM | POA: Diagnosis not present

## 2021-09-23 DIAGNOSIS — H04123 Dry eye syndrome of bilateral lacrimal glands: Secondary | ICD-10-CM | POA: Diagnosis not present

## 2021-09-23 DIAGNOSIS — H401232 Low-tension glaucoma, bilateral, moderate stage: Secondary | ICD-10-CM | POA: Diagnosis not present

## 2021-09-27 ENCOUNTER — Encounter: Payer: Self-pay | Admitting: Physical Medicine and Rehabilitation

## 2021-09-27 ENCOUNTER — Ambulatory Visit: Payer: Self-pay

## 2021-09-27 ENCOUNTER — Ambulatory Visit (INDEPENDENT_AMBULATORY_CARE_PROVIDER_SITE_OTHER): Payer: Medicare Other | Admitting: Physical Medicine and Rehabilitation

## 2021-09-27 ENCOUNTER — Other Ambulatory Visit: Payer: Self-pay

## 2021-09-27 VITALS — BP 110/75 | HR 85

## 2021-09-27 DIAGNOSIS — M5116 Intervertebral disc disorders with radiculopathy, lumbar region: Secondary | ICD-10-CM

## 2021-09-27 DIAGNOSIS — M48061 Spinal stenosis, lumbar region without neurogenic claudication: Secondary | ICD-10-CM

## 2021-09-27 MED ORDER — METHYLPREDNISOLONE ACETATE 80 MG/ML IJ SUSP: 80.0000 mg | Freq: Once | INTRAMUSCULAR | Status: AC

## 2021-09-27 NOTE — Patient Instructions (Signed)

## 2021-09-27 NOTE — Progress Notes (Signed)
Pt state lower back pain. Pt state he has pain the runs down both leg Pt state riding his bike and running makes the pain worse. Pt state he takes over the counter pain meds.  Numeric Pain Rating Scale and Functional Assessment Average Pain 1   In the last MONTH (on 0-10 scale) has pain interfered with the following?  1. General activity like being  able to carry out your everyday physical activities such as walking, climbing stairs, carrying groceries, or moving a chair?  Rating(10)   +Driver, -BT, -Dye Allergies.

## 2021-09-28 ENCOUNTER — Telehealth: Payer: Self-pay | Admitting: Family Medicine

## 2021-09-28 NOTE — Telephone Encounter (Signed)
Pt would like a call from the nurse to discuss having a reaction to lamoTRIgine (LAMICTAL) 100 MG tablet . Side effects having some off balance while walking.

## 2021-09-28 NOTE — Telephone Encounter (Signed)
Called the patient back. Pt states that he has problems with his balance which has been an ongoing problem. He may run into the wall. The patient has been on the medication for over a year. Advised that most likely since he has been on this medication the symptoms are not related to the medication. He states that this when he was on gabapentin the balance was much worse but he has had this problem while being on the lamotrigine. Pt states that it is intermittent and that mostly viers to the left. There is no dizziness associated with it. He had not had any head ct or imaging since 2020. He denies falls. I was able to get the pt worked in on Monday with Dr. Felecia Shelling

## 2021-10-04 ENCOUNTER — Encounter: Payer: Self-pay | Admitting: Neurology

## 2021-10-04 ENCOUNTER — Ambulatory Visit (INDEPENDENT_AMBULATORY_CARE_PROVIDER_SITE_OTHER): Payer: Medicare Other | Admitting: Neurology

## 2021-10-04 VITALS — BP 110/68 | HR 88 | Ht 69.75 in | Wt 200.0 lb

## 2021-10-04 DIAGNOSIS — M5417 Radiculopathy, lumbosacral region: Secondary | ICD-10-CM | POA: Diagnosis not present

## 2021-10-04 DIAGNOSIS — R200 Anesthesia of skin: Secondary | ICD-10-CM

## 2021-10-04 DIAGNOSIS — R269 Unspecified abnormalities of gait and mobility: Secondary | ICD-10-CM | POA: Diagnosis not present

## 2021-10-04 NOTE — Progress Notes (Signed)
[facility]  PATIENT: Glenn Robertson DOB: 19-Jan-1966  REFERRING DOCTOR OR PCP: Luetta Nutting SOURCE: Patient, notes from PCP, laboratory tests  _________________________________   HISTORICAL  CHIEF COMPLAINT:  Chief Complaint  Patient presents with   Follow-up    Rm 1, alone. Here to f/u for ongoing balance concerns. Pt has has notices during his walks and turning corners he feels like he's swaying to his side. Unable to bend down without feeling he's going to fall down. Sx have been ongoing for months.     HISTORY OF PRESENT ILLNESS:  He is noting more difficulty with balance.  He notes changes in his gait and has more trouble squatting for a while.   When he walks he veers some and he says he feels he might fall if he leans backwards.   He did fall once getting out of bed.  He has numbness in both feet.   He has had left sided sciatica starting 2012 that improved but recently worsened.  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   He was on gabapentin  He has had a few more spells of altered consciousness but he had no complete loss of consciousness.   We did an EEG in 09/2018 and it was normal.  He also has some numbness in his hands.   He has had ulnar nerve transpositions in the past.      He has Type 2 dm and is on metformin, glucotrol and Jardience.   No polyneuropathy.  On NCV  NCV/EMG 05/12/2021 shows the following: 1.   No evidence of sensory, motor or autonomic polyneuropathy 2.   Mild chronic left S1 radiculopathy     REVIEW OF SYSTEMS: Constitutional: No fevers, chills, sweats, or change in appetite Eyes: No visual changes, double vision, eye pain Ear, nose and throat: No hearing loss, ear pain, nasal congestion, sore throat Cardiovascular: No chest pain, palpitations Respiratory:  No shortness of breath at rest or with exertion.   No wheezes.  He snores. GastrointestinaI: No nausea, vomiting,  diarrhea, abdominal pain, fecal incontinence Genitourinary:  No dysuria, urinary retention or frequency.  No nocturia. Musculoskeletal:  No neck pain, back pain Integumentary: No rash, pruritus, skin lesions Neurological: as above Psychiatric: He has a diagnosis of paranoid schizophrenia.   Endocrine: No palpitations, diaphoresis, change in appetite, change in weigh or increased thirst Hematologic/Lymphatic:  No anemia, purpura, petechiae. Allergic/Immunologic: No itchy/runny eyes, nasal congestion, recent allergic reactions, rashes  ALLERGIES: Allergies  Allergen Reactions   Citric Acid Other (See Comments)    Runny nose, eyes water   Food     Oranges or anything with citric acid   Gabapentin     Walking into things, hard to maintain balance, falls    HOME MEDICATIONS:  Current Outpatient Medications:    ACCU-CHEK SOFTCLIX LANCETS lancets, 3 (three) times daily. for testing, Disp: , Rfl: 0   albuterol (VENTOLIN HFA) 108 (90 Base) MCG/ACT inhaler, Inhale 1-2 puffs into the lungs every 6 (six) hours as needed for wheezing or shortness of breath., Disp: 18 g, Rfl: 1   amantadine (SYMMETREL) 100 MG capsule, Take 1 capsule (100 mg total) by mouth 2 (two) times daily., Disp: 60 capsule, Rfl: 1   Cariprazine HCl (VRAYLAR) 6 MG CAPS, TAKE 1 CAPSULE(6 MG) BY MOUTH DAILY, Disp: 30 capsule, Rfl: 1   clonazePAM (KLONOPIN) 1 MG tablet, Take 1 tablet (1 mg total) by mouth 3 (three) times  daily as needed for anxiety. for anxiety, Disp: 90 tablet, Rfl: 1   fluPHENAZine (PROLIXIN) 5 MG tablet, TAKE 1 TABLET(5 MG) BY MOUTH AT BEDTIME, Disp: 30 tablet, Rfl: 1   fluticasone (FLONASE) 50 MCG/ACT nasal spray, SHAKE LIQUID AND USE 2 SPRAYS IN EACH NOSTRIL DAILY, Disp: 16 g, Rfl: 6   glipiZIDE (GLUCOTROL) 10 MG tablet, TAKE 1 TABLET(10 MG) BY MOUTH TWICE DAILY, Disp: 180 tablet, Rfl: 3   glucose blood (ACCU-CHEK GUIDE) test strip, TEST THREE TIMES DAILY, Disp: 100 strip, Rfl: 3   JARDIANCE 25 MG TABS  tablet, TAKE 1 TABLET BY MOUTH DAILY, Disp: 90 tablet, Rfl: 2   lamoTRIgine (LAMICTAL) 100 MG tablet, Take 1 tablet by mouth twice daily, Disp: 180 tablet, Rfl: 3   LUMIGAN 0.01 % SOLN, INSTILL 1 DROP INTO AFFECTED EYE ONCE AT BEDTIME, Disp: , Rfl:    meloxicam (MOBIC) 15 MG tablet, TAKE 1 TABLET(15 MG) BY MOUTH DAILY, Disp: 30 tablet, Rfl: 3   metFORMIN (GLUCOPHAGE-XR) 500 MG 24 hr tablet, TAKE 2 TABLETS(1000 MG) BY MOUTH TWICE DAILY, Disp: 360 tablet, Rfl: 1   montelukast (SINGULAIR) 10 MG tablet, TAKE 1 TABLET(10 MG) BY MOUTH AT BEDTIME, Disp: 90 tablet, Rfl: 3   RESTASIS 0.05 % ophthalmic emulsion, , Disp: , Rfl:    sertraline (ZOLOFT) 100 MG tablet, Take 1 tablet (100 mg total) by mouth daily., Disp: 30 tablet, Rfl: 1   simvastatin (ZOCOR) 20 MG tablet, TAKE 1 TABLET(20 MG) BY MOUTH EVERY EVENING, Disp: 90 tablet, Rfl: 3   tadalafil (CIALIS) 20 MG tablet, TAKE ONE-HALF TO ONE TABLET BY MOUTH EVERY OTHER DAY AS NEEDED FOR ERECTILE DYSFUNCTION, Disp: 10 tablet, Rfl: 4   triamcinolone (KENALOG) 0.1 %, Apply 1 application topically 2 (two) times daily., Disp: 30 g, Rfl: 0   TYRVAYA 0.03 MG/ACT SOLN, Spray 1 spray in each nostril twice daily, approximately 12 hours apart, Disp: , Rfl:   Current Facility-Administered Medications:    methylPREDNISolone acetate (DEPO-MEDROL) injection 80 mg, 80 mg, Other, Once, Magnus Sinning, MD  PAST MEDICAL HISTORY: Past Medical History:  Diagnosis Date   Allergy    dog and cats, citrus   Anxiety    Asthma    Chronic pain    Depression    Diabetes mellitus    Glaucoma    Neuromuscular disorder ([place])    Paranoid schizophrenia ([place])     PAST SURGICAL HISTORY: Past Surgical History:  Procedure Laterality Date   ANKLE ARTHROSCOPY     Arm Surgery  1/12   rt ulnar nerve decompression   EPIDURAL BLOCK INJECTION     several   ULNAR NERVE TRANSPOSITION  01/19/2012   Procedure: ULNAR NERVE DECOMPRESSION/TRANSPOSITION;  Surgeon: Cammie Sickle., MD;   Location: [facility];  Service: Orthopedics;  Laterality: Left;  ulnar nerve decompression vs transposition left elbow    FAMILY HISTORY: Family History  Problem Relation Age of Onset   Diabetes Father    Diabetes Paternal Uncle    Colon cancer Neg Hx    Colon polyps Neg Hx    Esophageal cancer Neg Hx    Rectal cancer Neg Hx    Stomach cancer Neg Hx     SOCIAL HISTORY:  Social History   Socioeconomic History   Marital status: Single    Spouse name: Not on file   Number of children: 0   Years of education: BA   Highest education level: Not on file  Occupational History  Occupation: Unemlpoyed-disabled  Tobacco Use   Smoking status: Never   Smokeless tobacco: Never  Vaping Use   Vaping Use: Never used  Substance and Sexual Activity   Alcohol use: No   Drug use: No   Sexual activity: Yes  Other Topics Concern   Not on file  Social History Narrative   Lives with mother   Caffeine use: Coke very rare   Right handed    Social Determinants of Health   Financial Resource Strain: Low Risk    Difficulty of Paying Living Expenses: Not hard at all  Food Insecurity: No Food Insecurity   Worried About Charity fundraiser in the Last Year: Never true   Ran Out of Food in the Last Year: Never true  Transportation Needs: No Transportation Needs   Lack of Transportation (Medical): No   Lack of Transportation (Non-Medical): No  Physical Activity: Insufficiently Active   Days of Exercise per Week: 1 day   Minutes of Exercise per Session: 20 min  Stress: No Stress Concern Present   Feeling of Stress : Not at all  Social Connections: Socially Isolated   Frequency of Communication with Friends and Family: Three times a week   Frequency of Social Gatherings with Friends and Family: Three times a week   Attends Religious Services: Never   Active Member of Clubs or Organizations: No   Attends Music therapist: Not on file   Marital Status: Never  married  Intimate Partner Violence: Not At Risk   Fear of Current or Ex-Partner: No   Emotionally Abused: No   Physically Abused: No   Sexually Abused: No     PHYSICAL EXAM  Vitals:   10/04/21 1103  BP: 110/68  Pulse: 88  SpO2: 97%  Weight: 200 lb (90.7 kg)  Height: 5' 9.75" (1.772 m)    Body mass index is 28.9 kg/m.   General: The patient is well-developed and well-nourished and in no acute distress   Skin: Extremities are without rash or edema.  Neurologic Exam  Mental status: The patient is alert and oriented x 3 at the time of the examination. The patient has apparent normal recent and remote memory, with an apparently normal attention span and concentration ability.   Speech is normal.  Cranial nerves: Extraocular movements are full.  .Facial strength and sensation were normal.  No obvious hearing deficits are noted.  Motor:  Muscle bulk is normal.   Tone is normal. Strength is  5 / 5 in all 4 extremities.   Sensory: Sensory testing is intact to pinprick, soft touch and vibration sensation in the arms.  He reports mildly reduced touch and vibration sensation in the toes but not at the ankles.  Coordination: Cerebellar testing reveals good finger-nose-finger and heel-to-shin bilaterally.  Gait and station: Station is normal.   Gait is normal. Tandem gait is mildly wide. Romberg is negative.   Reflexes: Deep tendon reflexes are symmetric and normal bilaterally.       DIAGNOSTIC DATA (LABS, IMAGING, TESTING) - I reviewed patient records, labs, notes, testing and imaging myself where available.  Lab Results  Component Value Date   WBC 7.0 07/15/2021   HGB 15.3 07/15/2021   HCT 48.2 07/15/2021   MCV 87.6 07/15/2021   PLT 191.0 07/15/2021      Component Value Date/Time   NA 137 07/15/2021 0833   K 4.1 07/15/2021 0833   CL 101 07/15/2021 0833   CO2 29 07/15/2021 0833   GLUCOSE  77 07/15/2021 0833   BUN 14 07/15/2021 0833   CREATININE 1.15 07/15/2021 0833    CREATININE 1.10 03/02/2018 1451   CALCIUM 9.6 07/15/2021 0833   PROT 7.3 07/15/2021 0833   ALBUMIN 4.8 07/15/2021 0833   AST 13 07/15/2021 0833   ALT 13 07/15/2021 0833   ALKPHOS 82 07/15/2021 0833   BILITOT 0.8 07/15/2021 0833   GFRNONAA >60 06/28/2019 1040   GFRAA >60 06/28/2019 1040   Lab Results  Component Value Date   CHOL 152 07/15/2021   HDL 61.00 07/15/2021   LDLCALC 77 07/15/2021   LDLDIRECT 78.0 07/15/2021   TRIG 67.0 07/15/2021   CHOLHDL 2 07/15/2021   Lab Results  Component Value Date   HGBA1C 7.7 (H) 07/15/2021   Lab Results  Component Value Date   VITAMINB12 >1526 (H) 09/22/2020   Lab Results  Component Value Date   TSH 1.02 07/08/2019       ASSESSMENT AND PLAN  Gait disturbance  Numbness  Lumbosacral radiculopathy at S1  1.  He has fairly mild gait disturbance.   NCV/EMG showed no neuropathy.  He also has urinary urgency.   MRI of the cervical spine to rule out myelopathy. 2.  He feels he did not get benefit form lamotrigine so we will d/c.   3.  Rtc prn    Tobi Leinweber A. Felecia Shelling, MD, [facility] 03/06/3902, 00:92 AM Certified in Neurology, Clinical Neurophysiology, Sleep Medicine, Pain Medicine and Neuroimaging  [facility] [address], [place], [place] [address] [phone]

## 2021-10-05 ENCOUNTER — Telehealth (HOSPITAL_BASED_OUTPATIENT_CLINIC_OR_DEPARTMENT_OTHER): Payer: Medicare Other | Admitting: Psychiatry

## 2021-10-05 ENCOUNTER — Other Ambulatory Visit: Payer: Self-pay

## 2021-10-05 ENCOUNTER — Telehealth: Payer: Self-pay | Admitting: Neurology

## 2021-10-05 ENCOUNTER — Other Ambulatory Visit (HOSPITAL_COMMUNITY): Payer: Self-pay | Admitting: Psychiatry

## 2021-10-05 DIAGNOSIS — R404 Transient alteration of awareness: Secondary | ICD-10-CM | POA: Diagnosis not present

## 2021-10-05 MED ORDER — AMANTADINE HCL 100 MG PO CAPS: 100.0000 mg | ORAL_CAPSULE | Freq: Two times a day (BID) | ORAL | 1 refills | Status: DC

## 2021-10-05 MED ORDER — CLONAZEPAM 1 MG PO TABS: 1.0000 mg | ORAL_TABLET | Freq: Three times a day (TID) | ORAL | 1 refills | Status: DC | PRN

## 2021-10-05 MED ORDER — VRAYLAR 6 MG PO CAPS: ORAL_CAPSULE | ORAL | 1 refills | Status: DC

## 2021-10-05 MED ORDER — SERTRALINE HCL 100 MG PO TABS: 100.0000 mg | ORAL_TABLET | Freq: Every day | ORAL | 1 refills | Status: DC

## 2021-10-05 MED ORDER — FLUPHENAZINE HCL 5 MG PO TABS: ORAL_TABLET | ORAL | 1 refills | Status: DC
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
## 2021-10-05 NOTE — Telephone Encounter (Signed)
Medicare/medicaid order sent to [facility], [facility] they will reach out to the patient to schedule.  ?

## 2021-10-07 ENCOUNTER — Ambulatory Visit (INDEPENDENT_AMBULATORY_CARE_PROVIDER_SITE_OTHER): Payer: Medicare Other | Admitting: Family Medicine

## 2021-10-07 ENCOUNTER — Other Ambulatory Visit: Payer: Self-pay

## 2021-10-07 ENCOUNTER — Encounter: Payer: Self-pay | Admitting: Family Medicine

## 2021-10-07 VITALS — BP 115/70 | HR 90 | Temp 97.1°F | Ht 69.0 in | Wt 198.2 lb

## 2021-10-07 DIAGNOSIS — E119 Type 2 diabetes mellitus without complications: Secondary | ICD-10-CM | POA: Diagnosis not present

## 2021-10-07 DIAGNOSIS — L720 Epidermal cyst: Secondary | ICD-10-CM

## 2021-10-07 MED ORDER — DOXYCYCLINE HYCLATE 100 MG PO TABS: 100.0000 mg | ORAL_TABLET | Freq: Two times a day (BID) | ORAL | 0 refills | Status: AC

## 2021-10-07 NOTE — Progress Notes (Signed)
Established Patient Office Visit  Subjective:  Patient ID: Glenn Robertson, male    DOB: 05-27-1966  Age: 56 y.o. MRN: [id number]  CC:  Chief Complaint  Patient presents with   Rash    Rash right side of inner thigh patient notice last week.     HPI Glenn Robertson presents for evaluation of a 1 week history of a tender lump in his right inner thigh.  Past Medical History:  Diagnosis Date   Allergy    dog and cats, citrus   Anxiety    Asthma    Chronic pain    Depression    Diabetes mellitus    Glaucoma    Neuromuscular disorder ([place])    Paranoid schizophrenia ([place])     Past Surgical History:  Procedure Laterality Date   ANKLE ARTHROSCOPY     Arm Surgery  1/12   rt ulnar nerve decompression   EPIDURAL BLOCK INJECTION     several   ULNAR NERVE TRANSPOSITION  01/19/2012   Procedure: ULNAR NERVE DECOMPRESSION/TRANSPOSITION;  Surgeon: Cammie Sickle., MD;  Location: [facility];  Service: Orthopedics;  Laterality: Left;  ulnar nerve decompression vs transposition left elbow    Family History  Problem Relation Age of Onset   Diabetes Father    Diabetes Paternal Uncle    Colon cancer Neg Hx    Colon polyps Neg Hx    Esophageal cancer Neg Hx    Rectal cancer Neg Hx    Stomach cancer Neg Hx     Social History   Socioeconomic History   Marital status: Single    Spouse name: Not on file   Number of children: 0   Years of education: BA   Highest education level: Not on file  Occupational History   Occupation: Unemlpoyed-disabled  Tobacco Use   Smoking status: Never   Smokeless tobacco: Never  Vaping Use   Vaping Use: Never used  Substance and Sexual Activity   Alcohol use: No   Drug use: No   Sexual activity: Yes  Other Topics Concern   Not on file  Social History Narrative   Lives with mother   Caffeine use: Coke very rare   Right handed    Social Determinants of Health   Financial Resource Strain: Low Risk    Difficulty of Paying  Living Expenses: Not hard at all  Food Insecurity: No Food Insecurity   Worried About Charity fundraiser in the Last Year: Never true   Ran Out of Food in the Last Year: Never true  Transportation Needs: No Transportation Needs   Lack of Transportation (Medical): No   Lack of Transportation (Non-Medical): No  Physical Activity: Insufficiently Active   Days of Exercise per Week: 1 day   Minutes of Exercise per Session: 20 min  Stress: No Stress Concern Present   Feeling of Stress : Not at all  Social Connections: Socially Isolated   Frequency of Communication with Friends and Family: Three times a week   Frequency of Social Gatherings with Friends and Family: Three times a week   Attends Religious Services: Never   Active Member of Clubs or Organizations: No   Attends Music therapist: Not on file   Marital Status: Never married  Intimate Partner Violence: Not At Risk   Fear of Current or Ex-Partner: No   Emotionally Abused: No   Physically Abused: No   Sexually Abused: No    Outpatient Medications Prior to  Visit  Medication Sig Dispense Refill   ACCU-CHEK SOFTCLIX LANCETS lancets 3 (three) times daily. for testing  0   albuterol (VENTOLIN HFA) 108 (90 Base) MCG/ACT inhaler Inhale 1-2 puffs into the lungs every 6 (six) hours as needed for wheezing or shortness of breath. 18 g 1   amantadine (SYMMETREL) 100 MG capsule Take 1 capsule (100 mg total) by mouth 2 (two) times daily. 60 capsule 1   Cariprazine HCl (VRAYLAR) 6 MG CAPS TAKE 1 CAPSULE(6 MG) BY MOUTH DAILY 30 capsule 1   clonazePAM (KLONOPIN) 1 MG tablet Take 1 tablet (1 mg total) by mouth 3 (three) times daily as needed for anxiety. for anxiety 90 tablet 1   fluPHENAZine (PROLIXIN) 5 MG tablet TAKE 1 TABLET(5 MG) BY MOUTH AT BEDTIME 30 tablet 1   fluticasone (FLONASE) 50 MCG/ACT nasal spray SHAKE LIQUID AND USE 2 SPRAYS IN EACH NOSTRIL DAILY 16 g 6   glipiZIDE (GLUCOTROL) 10 MG tablet TAKE 1 TABLET(10 MG) BY  MOUTH TWICE DAILY 180 tablet 3   glucose blood (ACCU-CHEK GUIDE) test strip TEST THREE TIMES DAILY 100 strip 3   JARDIANCE 25 MG TABS tablet TAKE 1 TABLET BY MOUTH DAILY 90 tablet 2   LUMIGAN 0.01 % SOLN INSTILL 1 DROP INTO AFFECTED EYE ONCE AT BEDTIME     meloxicam (MOBIC) 15 MG tablet TAKE 1 TABLET(15 MG) BY MOUTH DAILY 30 tablet 3   metFORMIN (GLUCOPHAGE-XR) 500 MG 24 hr tablet TAKE 2 TABLETS(1000 MG) BY MOUTH TWICE DAILY 360 tablet 1   montelukast (SINGULAIR) 10 MG tablet TAKE 1 TABLET(10 MG) BY MOUTH AT BEDTIME 90 tablet 3   RESTASIS 0.05 % ophthalmic emulsion      sertraline (ZOLOFT) 100 MG tablet Take 1 tablet (100 mg total) by mouth daily. 30 tablet 1   tadalafil (CIALIS) 20 MG tablet TAKE ONE-HALF TO ONE TABLET BY MOUTH EVERY OTHER DAY AS NEEDED FOR ERECTILE DYSFUNCTION 10 tablet 4   TYRVAYA 0.03 MG/ACT SOLN Spray 1 spray in each nostril twice daily, approximately 12 hours apart     simvastatin (ZOCOR) 20 MG tablet TAKE 1 TABLET(20 MG) BY MOUTH EVERY EVENING (Patient not taking: Reported on 10/07/2021) 90 tablet 3   triamcinolone (KENALOG) 0.1 % Apply 1 application topically 2 (two) times daily. (Patient not taking: Reported on 10/07/2021) 30 g 0   Facility-Administered Medications Prior to Visit  Medication Dose Route Frequency Provider Last Rate Last Admin   methylPREDNISolone acetate (DEPO-MEDROL) injection 80 mg  80 mg Other Once Magnus Sinning, MD        Allergies  Allergen Reactions   Citric Acid Other (See Comments)    Runny nose, eyes water   Food     Oranges or anything with citric acid   Gabapentin     Walking into things, hard to maintain balance, falls    ROS Review of Systems  Constitutional: Negative.   HENT: Negative.    Eyes:  Negative for photophobia and visual disturbance.  Respiratory: Negative.    Cardiovascular: Negative.   Gastrointestinal: Negative.   Skin:  Positive for color change and wound.  Psychiatric/Behavioral: Negative.       Objective:     Physical Exam Vitals and nursing note reviewed.  Constitutional:      General: He is not in acute distress.    Appearance: Normal appearance. He is not ill-appearing, toxic-appearing or diaphoretic.  HENT:     Head: Normocephalic and atraumatic.  Eyes:     General: No  scleral icterus.       Right eye: No discharge.        Left eye: No discharge.     Conjunctiva/sclera: Conjunctivae normal.  Pulmonary:     Effort: Pulmonary effort is normal.  Skin:    General: Skin is warm and dry.       Neurological:     Mental Status: He is alert and oriented to person, place, and time.  Psychiatric:        Mood and Affect: Mood normal.        Behavior: Behavior normal.    BP 115/70 (BP Location: Right Arm, Patient Position: Sitting, Cuff Size: Normal)    Pulse 90    Temp (!) 97.1 F (36.2 C) (Temporal)    Ht 5\' 9"  (1.753 m)    Wt 198 lb 3.2 oz (89.9 kg)    SpO2 96%    BMI 29.27 kg/m  Wt Readings from Last 3 Encounters:  10/07/21 198 lb 3.2 oz (89.9 kg)  10/04/21 200 lb (90.7 kg)  09/21/21 200 lb (90.7 kg)     Health Maintenance Due  Topic Date Due   Zoster Vaccines- Shingrix (1 of 2) Never done   OPHTHALMOLOGY EXAM  03/19/2021    There are no preventive care reminders to display for this patient.  Lab Results  Component Value Date   TSH 1.02 07/08/2019   Lab Results  Component Value Date   WBC 7.0 07/15/2021   HGB 15.3 07/15/2021   HCT 48.2 07/15/2021   MCV 87.6 07/15/2021   PLT 191.0 07/15/2021   Lab Results  Component Value Date   NA 137 07/15/2021   K 4.1 07/15/2021   CO2 29 07/15/2021   GLUCOSE 77 07/15/2021   BUN 14 07/15/2021   CREATININE 1.15 07/15/2021   BILITOT 0.8 07/15/2021   ALKPHOS 82 07/15/2021   AST 13 07/15/2021   ALT 13 07/15/2021   PROT 7.3 07/15/2021   ALBUMIN 4.8 07/15/2021   CALCIUM 9.6 07/15/2021   ANIONGAP 9 06/28/2019   GFR 71.97 07/15/2021   Lab Results  Component Value Date   CHOL 152 07/15/2021   Lab Results  Component  Value Date   HDL 61.00 07/15/2021   Lab Results  Component Value Date   LDLCALC 77 07/15/2021   Lab Results  Component Value Date   TRIG 67.0 07/15/2021   Lab Results  Component Value Date   CHOLHDL 2 07/15/2021   Lab Results  Component Value Date   HGBA1C 7.7 (H) 07/15/2021      Assessment & Plan:   Problem List Items Addressed This Visit       Endocrine   Type 2 diabetes mellitus without complication, without long-term current use of insulin ([place])   Relevant Orders   Ambulatory referral to Ophthalmology     Other   Inclusion cyst - Primary   Relevant Medications   doxycycline (VIBRA-TABS) 100 MG tablet   Other Relevant Orders   Wound culture    Meds ordered this encounter  Medications   doxycycline (VIBRA-TABS) 100 MG tablet    Sig: Take 1 tablet (100 mg total) by mouth 2 (two) times daily for 10 days.    Dispense:  20 tablet    Refill:  0    Follow-up: Return if symptoms worsen or fail to improve.   Advised warm compresses for 10 to 15 minutes 3 times daily.  We will start doxycycline twice daily for 10 days.  Follow-up if not  improving by next week. Libby Maw, MD

## 2021-10-09 NOTE — Procedures (Signed)
Lumbosacral Transforaminal Epidural Steroid Injection - Sub-Pedicular Approach with Fluoroscopic Guidance  Patient: Glenn Robertson      Date of Birth: [birth year] MRN: [id number] PCP: Libby Maw, MD      Visit Date: 09/27/2021   Universal Protocol:    Date/Time: 09/27/2021  Consent Given By: the patient  Position: PRONE  Additional Comments: Vital signs were monitored before and after the procedure. Patient was prepped and draped in the usual sterile fashion. The correct patient, procedure, and site was verified.   Injection Procedure Details:   Procedure diagnoses: Radiculopathy due to lumbar intervertebral disc disorder [M51.16]    Meds Administered:  Meds ordered this encounter  Medications   methylPREDNISolone acetate (DEPO-MEDROL) injection 80 mg    Laterality: Left  Location/Site: L5  Needle:5.0 in., 22 ga.  Short bevel or Quincke spinal needle  Needle Placement: Transforaminal  Findings:    -Comments: Excellent flow of contrast along the nerve, nerve root and into the epidural space.  Procedure Details: After squaring off the end-plates to get a true AP view, the C-arm was positioned so that an oblique view of the foramen as noted above was visualized. The target area is just inferior to the "nose of the scotty dog" or sub pedicular. The soft tissues overlying this structure were infiltrated with 2-3 ml. of 1% Lidocaine without Epinephrine.  The spinal needle was inserted toward the target using a "trajectory" view along the fluoroscope beam.  Under AP and lateral visualization, the needle was advanced so it did not puncture dura and was located close the 6 O'Clock position of the pedical in AP tracterory. Biplanar projections were used to confirm position. Aspiration was confirmed to be negative for CSF and/or blood. A 1-2 ml. volume of Isovue-250 was injected and flow of contrast was noted at each level. Radiographs were obtained for documentation  purposes.   After attaining the desired flow of contrast documented above, a 0.5 to 1.0 ml test dose of 0.25% Marcaine was injected into each respective transforaminal space.  The patient was observed for 90 seconds post injection.  After no sensory deficits were reported, and normal lower extremity motor function was noted,   the above injectate was administered so that equal amounts of the injectate were placed at each foramen (level) into the transforaminal epidural space.   Additional Comments:  No complications occurred Dressing: 2 x 2 sterile gauze and Band-Aid    Post-procedure details: Patient was observed during the procedure. Post-procedure instructions were reviewed.  Patient left the clinic in stable condition.

## 2021-10-09 NOTE — Progress Notes (Signed)
Glenn Robertson - 56 y.o. male MRN [id number]  Date of birth: Apr 24, 1966  Office Visit Note: Visit Date: 09/27/2021 PCP: Libby Maw, MD Referred by: Libby Maw,*  Subjective: Chief Complaint  Patient presents with   Lower Back - Pain   Right Leg - Pain   Left Leg - Pain   HPI:  Glenn Robertson is a 56 y.o. male who comes in today at the request of Dr. Rodell Perna for planned Left L5-S1 Lumbar Transforaminal epidural steroid injection with fluoroscopic guidance.  The patient has failed conservative care including home exercise, medications, time and activity modification.  This injection will be diagnostic and hopefully therapeutic.  Please see requesting physician notes for further details and justification.  ROS Otherwise per HPI.  Assessment & Plan: Visit Diagnoses:    ICD-10-CM   1. Radiculopathy due to lumbar intervertebral disc disorder  M51.16 XR C-ARM NO REPORT    Epidural Steroid injection    methylPREDNISolone acetate (DEPO-MEDROL) injection 80 mg    2. Foraminal stenosis of lumbar region  M48.061       Plan: No additional findings.   Meds & Orders:  Meds ordered this encounter  Medications   methylPREDNISolone acetate (DEPO-MEDROL) injection 80 mg    Orders Placed This Encounter  Procedures   XR C-ARM NO REPORT   Epidural Steroid injection    Follow-up: Return for visit to requesting provider as needed.   Procedures: No procedures performed  Lumbosacral Transforaminal Epidural Steroid Injection - Sub-Pedicular Approach with Fluoroscopic Guidance  Patient: Glenn Robertson      Date of Birth: 1966-01-09 MRN: [id number] PCP: Libby Maw, MD      Visit Date: 09/27/2021   Universal Protocol:    Date/Time: 09/27/2021  Consent Given By: the patient  Position: PRONE  Additional Comments: Vital signs were monitored before and after the procedure. Patient was prepped and draped in the usual sterile fashion. The correct patient,  procedure, and site was verified.   Injection Procedure Details:   Procedure diagnoses: Radiculopathy due to lumbar intervertebral disc disorder [M51.16]    Meds Administered:  Meds ordered this encounter  Medications   methylPREDNISolone acetate (DEPO-MEDROL) injection 80 mg    Laterality: Left  Location/Site: L5  Needle:5.0 in., 22 ga.  Short bevel or Quincke spinal needle  Needle Placement: Transforaminal  Findings:    -Comments: Excellent flow of contrast along the nerve, nerve root and into the epidural space.  Procedure Details: After squaring off the end-plates to get a true AP view, the C-arm was positioned so that an oblique view of the foramen as noted above was visualized. The target area is just inferior to the "nose of the scotty dog" or sub pedicular. The soft tissues overlying this structure were infiltrated with 2-3 ml. of 1% Lidocaine without Epinephrine.  The spinal needle was inserted toward the target using a "trajectory" view along the fluoroscope beam.  Under AP and lateral visualization, the needle was advanced so it did not puncture dura and was located close the 6 O'Clock position of the pedical in AP tracterory. Biplanar projections were used to confirm position. Aspiration was confirmed to be negative for CSF and/or blood. A 1-2 ml. volume of Isovue-250 was injected and flow of contrast was noted at each level. Radiographs were obtained for documentation purposes.   After attaining the desired flow of contrast documented above, a 0.5 to 1.0 ml test dose of 0.25% Marcaine was injected into each respective transforaminal space.  The patient was observed for 90 seconds post injection.  After no sensory deficits were reported, and normal lower extremity motor function was noted,   the above injectate was administered so that equal amounts of the injectate were placed at each foramen (level) into the transforaminal epidural space.   Additional Comments:  No  complications occurred Dressing: 2 x 2 sterile gauze and Band-Aid    Post-procedure details: Patient was observed during the procedure. Post-procedure instructions were reviewed.  Patient left the clinic in stable condition.    Clinical History: MRI LUMBAR SPINE WITHOUT CONTRAST   TECHNIQUE: Multiplanar, multisequence MR imaging of the lumbar spine was performed. No intravenous contrast was administered.   COMPARISON:  Previous radiograph from 11/11/2020 as well as prior MRI from 06/15/2019.   FINDINGS: Segmentation: Standard. Lowest well-formed disc space labeled the L5-S1 level.   Alignment: Physiologic with preservation of the normal lumbar lordosis. No listhesis.   Vertebrae: Vertebral body height maintained without acute or chronic fracture. Bone marrow signal intensity within normal limits. Probable small atypical hemangioma noted within the T12 vertebral body, stable. No other discrete or worrisome osseous lesions. Discogenic reactive endplate change with marrow edema present about the L5-S1 interspace. No other abnormal marrow edema.   Conus medullaris and cauda equina: Conus extends to the L1-2 level. Conus and cauda equina appear normal.   Paraspinal and other soft tissues: Paraspinous soft tissues within normal limits. Visualized visceral structures are unremarkable.   Disc levels:   L1-2:  Unremarkable.   L2-3: Mild degenerative intervertebral disc space narrowing with disc bulge and disc desiccation. Associated mild reactive endplate spurring. No spinal stenosis. No more than mild bilateral foraminal narrowing. No impingement. Appearance is stable.   L3-4: Mild degenerative intervertebral disc space narrowing with diffuse disc bulge and disc desiccation. Minimal reactive endplate spurring. No spinal stenosis. Mild bilateral L3 foraminal narrowing without frank impingement. Appearance is stable.   L4-5: Minimal disc bulge, slightly eccentric to the  left. Annular fissure at the level of the left neural foramen. No significant spinal stenosis. Mild bilateral L4 foraminal narrowing, slightly worse on the left. No frank impingement. Appearance is stable.   L5-S1: Advanced degenerative intervertebral disc space narrowing with disc desiccation and diffuse disc bulge. Associated reactive endplate change with marginal endplate osteophytic spurring and marrow edema. Superimposed broad-based central to left subarticular disc protrusion contacts the descending left S1 nerve root as it courses through the left lateral recess (series 5, image 39). No frank neural impingement. Mild epidural lipomatosis. No significant canal or lateral recess stenosis. Moderate bilateral L5 foraminal stenosis.   IMPRESSION: 1. Broad-based central 2 left subarticular disc protrusion at L5-S1, contacting and potentially irritating the descending left S1 nerve root. 2. Moderate bilateral L5 foraminal stenosis related to disc bulge, reactive endplate change, and marrow edema. 3. Discogenic reactive endplate change with marrow edema about the L5-S1 level, which could contribute to lower back pain. 4. Additional mild noncompressive disc bulging at L2-3 through L4-5 without significant stenosis or impingement.     Electronically Signed   By: Jeannine Boga M.D.   On: 01/08/2021 04:14     Objective:  VS:  HT:     WT:    BMI:      BP:110/75   HR:85bpm   TEMP: ( )   RESP:  Physical Exam Vitals and nursing note reviewed.  Constitutional:      General: He is not in acute distress.    Appearance: Normal appearance. He is not  ill-appearing.  HENT:     Head: Normocephalic and atraumatic.     Right Ear: External ear normal.     Left Ear: External ear normal.     Nose: No congestion.  Eyes:     Extraocular Movements: Extraocular movements intact.  Cardiovascular:     Rate and Rhythm: Normal rate.     Pulses: Normal pulses.  Pulmonary:     Effort:  Pulmonary effort is normal. No respiratory distress.  Abdominal:     General: There is no distension.     Palpations: Abdomen is soft.  Musculoskeletal:        General: No tenderness or signs of injury.     Cervical back: Neck supple.     Right lower leg: No edema.     Left lower leg: No edema.     Comments: Patient has good distal strength without clonus.  Skin:    Findings: No erythema or rash.  Neurological:     General: No focal deficit present.     Mental Status: He is alert and oriented to person, place, and time.     Sensory: No sensory deficit.     Motor: No weakness or abnormal muscle tone.     Coordination: Coordination normal.  Psychiatric:        Mood and Affect: Mood normal.        Behavior: Behavior normal.     Imaging: No results found.

## 2021-10-11 ENCOUNTER — Other Ambulatory Visit: Payer: Self-pay | Admitting: Family Medicine

## 2021-10-11 DIAGNOSIS — E119 Type 2 diabetes mellitus without complications: Secondary | ICD-10-CM

## 2021-10-11 LAB — WOUND CULTURE
MICRO NUMBER:: 12987258
RESULT:: NO GROWTH
SPECIMEN QUALITY:: ADEQUATE

## 2021-10-17 ENCOUNTER — Other Ambulatory Visit: Payer: Self-pay

## 2021-10-17 ENCOUNTER — Ambulatory Visit
Admission: RE | Admit: 2021-10-17 | Discharge: 2021-10-17 | Disposition: A | Discharge status: Home / Self Care | Payer: Medicare Other | Source: Ambulatory Visit | Attending: Neurology | Admitting: Neurology

## 2021-10-17 DIAGNOSIS — R200 Anesthesia of skin: Secondary | ICD-10-CM

## 2021-10-17 DIAGNOSIS — R269 Unspecified abnormalities of gait and mobility: Secondary | ICD-10-CM

## 2021-10-18 ENCOUNTER — Ambulatory Visit (HOSPITAL_COMMUNITY): Payer: Medicare Other | Admitting: Clinical

## 2021-11-02 ENCOUNTER — Other Ambulatory Visit (HOSPITAL_COMMUNITY): Payer: Self-pay | Admitting: Psychiatry

## 2021-11-03 ENCOUNTER — Encounter: Payer: Self-pay | Admitting: Orthopaedic Surgery

## 2021-11-09 ENCOUNTER — Ambulatory Visit (INDEPENDENT_AMBULATORY_CARE_PROVIDER_SITE_OTHER): Payer: Medicare Other | Admitting: Clinical

## 2021-11-09 ENCOUNTER — Other Ambulatory Visit: Payer: Self-pay

## 2021-11-09 DIAGNOSIS — F251 Schizoaffective disorder, depressive type: Secondary | ICD-10-CM | POA: Diagnosis not present

## 2021-11-09 NOTE — Progress Notes (Signed)
? ?  THERAPIST PROGRESS NOTE ? ?Session Time: 1pm ? ?Participation Level: Active ? ?Behavioral Response: Casual and NeatAlert"Even" ? ?Type of Therapy: Individual Therapy ? ?Treatment Goals addressed: coping methods ? ?ProgressTowards Goals: Progressing ? ?Interventions: Supportive ?Virtual Visit via Video Note ? ?I connected with Glenn Robertson on 11/09/21 at  1:00 PM EDT by a video enabled telemedicine application and verified that I am speaking with the correct person using two identifiers. ? ?Location: ?Patient: home ?Provider: office ?  ?I discussed the limitations of evaluation and management by telemedicine and the availability of in person appointments. The patient expressed understanding and agreed to proceed. ?  ?I discussed the assessment and treatment plan with the patient. The patient was provided an opportunity to ask questions and all were answered. The patient agreed with the plan and demonstrated an understanding of the instructions. ?  ?The patient was advised to call back or seek an in-person evaluation if the symptoms worsen or if the condition fails to improve as anticipated. ? ?I provided 40 minutes of non-face-to-face time during this encounter.  ?Summary: Pt reports he is planning a trip to [place] at the end of the month to visit family and engage in his hobby of photography. Pt states school is going good and is planning to take up another language next month. Pt reports he is unsure of where his relationship with his girlfriend is headed and says she would like to get married. Pt states he is not interested in marriage and says his lack of financial means prevents him from doing so. Pt reports limited physical activity but says he wants to start going back to the gym.  ? ?Suicidal/Homicidal:  Pt denies SI/HI, no AH/VH reported or witnessed. ? ?Therapist Response: Assessed for changes in mood, behavior and daily functioning. Highlighted pt strengths and discussed how strengths can be used to  manage symptoms. Actively listened as pt discussed his love for photography and learning new languages. ? ?Plan: Return again in 2 weeks. ? ?Diagnosis: Schizoaffective disorder, depressed type ? ?Collaboration of Care: Other none requested ? ?Patient/Guardian was advised Release of Information must be obtained prior to any record release in order to collaborate their care with an outside provider. Patient/Guardian was advised if they have not already done so to contact the registration department to sign all necessary forms in order for [place] to release information regarding their care.  ? ?Consent: Patient/Guardian gives verbal consent for treatment and assignment of benefits for services provided during this visit. Patient/Guardian expressed understanding and agreed to proceed.  ? ?Lashae Wollenberg Lynelle Smoke, LCSW ?11/09/2021 ? ?

## 2021-11-10 ENCOUNTER — Other Ambulatory Visit: Payer: Self-pay

## 2021-11-10 DIAGNOSIS — E119 Type 2 diabetes mellitus without complications: Secondary | ICD-10-CM

## 2021-11-10 MED ORDER — ACCU-CHEK GUIDE VI STRP: ORAL_STRIP | 3 refills | Status: DC

## 2021-11-16 ENCOUNTER — Other Ambulatory Visit: Payer: Self-pay

## 2021-11-16 ENCOUNTER — Encounter (HOSPITAL_COMMUNITY): Payer: Self-pay | Admitting: Psychiatry

## 2021-11-16 ENCOUNTER — Ambulatory Visit (HOSPITAL_BASED_OUTPATIENT_CLINIC_OR_DEPARTMENT_OTHER): Payer: Medicare Other | Admitting: Psychiatry

## 2021-11-16 DIAGNOSIS — F251 Schizoaffective disorder, depressive type: Secondary | ICD-10-CM

## 2021-11-16 DIAGNOSIS — R404 Transient alteration of awareness: Secondary | ICD-10-CM | POA: Diagnosis not present

## 2021-11-16 MED ORDER — SERTRALINE HCL 100 MG PO TABS: 100.0000 mg | ORAL_TABLET | Freq: Every day | ORAL | 1 refills | Status: DC

## 2021-11-16 MED ORDER — CLONAZEPAM 1 MG PO TABS: 1.0000 mg | ORAL_TABLET | Freq: Three times a day (TID) | ORAL | 1 refills | Status: DC | PRN

## 2021-11-16 MED ORDER — FLUPHENAZINE HCL 5 MG PO TABS: ORAL_TABLET | ORAL | 1 refills | Status: DC

## 2021-11-16 MED ORDER — VRAYLAR 6 MG PO CAPS
ORAL_CAPSULE | ORAL | 1 refills | Status: DC
Start: 2021-11-16 — End: 2022-01-10

## 2021-11-16 MED ORDER — AMANTADINE HCL 100 MG PO CAPS: 100.0000 mg | ORAL_CAPSULE | Freq: Two times a day (BID) | ORAL | 1 refills | Status: DC
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
## 2021-11-16 NOTE — Progress Notes (Signed)
Patient ID: Glenn Robertson, male   DOB: 09-17-1965, 56 y.o.   MRN: [id number] ? ?.BH MD/PA/NP OP Progress Note ?11/16/2021 ?[name]  ?MRN:  [id number] ? ? ?Chief Complaint:  Patient returns for medication management ? ?This appointment was phone based, limitations associated with this type of communication have been reviewed, and patient's identity verified using two identifiers .  ?Location of parties  ?Patient - home ?Clinician - [place] outpatient clinic ?Duration -25 minutes ? ?HPI: 56 yo male, who has been diagnosed with schizoaffective disorder. On disability ?He was being followed by Dr . Montel Culver, who has left the clinic, and I am providing follow ups /bridging until new provider starts . ? ? ?Patient reports he has been doing "all right".  ?Continues to express i having different interests and being active in different activities and particularly in learning, with ongoing efforts regarding learning new languages and states he also likes photography as a hobby. ?He is future oriented and looking forward to going to [facility] for a few days in the near future. ?Patient has a history of chronic paranoid ideations.  He has some insight regarding paranoia.  States that he continues to feel vaguely "paranoid" but less so than before. ?During past visits has reported concern about suspicious vehicles near his home but states that he has recently not seen any suspicious vehicles or activities.  He describes having some issues with his neighbor but when explored no overt paranoid thoughts noted.  He states it is because neighbor plays loud music and is rude. ?He does not endorse depression or sadness and describes mood as stable. ?He is not endorsing medication side effects.  States he is taking medications as prescribed. ?He does report having had some gait difficulties, we reviewed these, at this time states that one of his knees tends to "buckle".  He states his actual gait is steady.  He is not currently endorsing  having abnormal involuntary movements. ? ? ? ? ? ? ? ? ? ? ? ? ?  ? ?Visit Diagnosis:  ?No diagnosis found. ? ?Past Psychiatric History: Please see intake H&P. ? ?Past Medical History:  ?Past Medical History:  ?Diagnosis Date  ? Allergy   ? dog and cats, citrus  ? Anxiety   ? Asthma   ? Chronic pain   ? Depression   ? Diabetes mellitus   ? Glaucoma   ? Neuromuscular disorder ([place])   ? Paranoid schizophrenia ([place])   ?  ?Past Surgical History:  ?Procedure Laterality Date  ? ANKLE ARTHROSCOPY    ? Arm Surgery  1/12  ? rt ulnar nerve decompression  ? EPIDURAL BLOCK INJECTION    ? several  ? ULNAR NERVE TRANSPOSITION  01/19/2012  ? Procedure: ULNAR NERVE DECOMPRESSION/TRANSPOSITION;  Surgeon: Cammie Sickle., MD;  Location: [facility];  Service: Orthopedics;  Laterality: Left;  ulnar nerve decompression vs transposition left elbow  ? ? ?Family Psychiatric History: None. ? ?Family History:  ?Family History  ?Problem Relation Age of Onset  ? Diabetes Father   ? Diabetes Paternal Uncle   ? Colon cancer Neg Hx   ? Colon polyps Neg Hx   ? Esophageal cancer Neg Hx   ? Rectal cancer Neg Hx   ? Stomach cancer Neg Hx   ? ? ?Social History:  ?Social History  ? ?Socioeconomic History  ? Marital status: Single  ?  Spouse name: Not on file  ? Number of children: 0  ? Years of education:  BA  ? Highest education level: Not on file  ?Occupational History  ? Occupation: Unemlpoyed-disabled  ?Tobacco Use  ? Smoking status: Never  ? Smokeless tobacco: Never  ?Vaping Use  ? Vaping Use: Never used  ?Substance and Sexual Activity  ? Alcohol use: No  ? Drug use: No  ? Sexual activity: Yes  ?Other Topics Concern  ? Not on file  ?Social History Narrative  ? Lives with mother  ? Caffeine use: Coke very rare  ? Right handed   ? ?Social Determinants of Health  ? ?Financial Resource Strain: Low Risk   ? Difficulty of Paying Living Expenses: Not hard at all  ?Food Insecurity: No Food Insecurity  ? Worried About Charity fundraiser  in the Last Year: Never true  ? Ran Out of Food in the Last Year: Never true  ?Transportation Needs: No Transportation Needs  ? Lack of Transportation (Medical): No  ? Lack of Transportation (Non-Medical): No  ?Physical Activity: Insufficiently Active  ? Days of Exercise per Week: 1 day  ? Minutes of Exercise per Session: 20 min  ?Stress: No Stress Concern Present  ? Feeling of Stress : Not at all  ?Social Connections: Socially Isolated  ? Frequency of Communication with Friends and Family: Three times a week  ? Frequency of Social Gatherings with Friends and Family: Three times a week  ? Attends Religious Services: Never  ? Active Member of Clubs or Organizations: No  ? Attends Archivist Meetings: Not on file  ? Marital Status: Never married  ? ? ?Allergies:  ?Allergies  ?Allergen Reactions  ? Citric Acid Other (See Comments)  ?  Runny nose, eyes water  ? Food   ?  Oranges or anything with citric acid  ? Gabapentin   ?  Walking into things, hard to maintain balance, falls  ? ? ?Metabolic Disorder Labs: ?Lab Results  ?Component Value Date  ? HGBA1C 7.7 (H) 07/15/2021  ? MPG 123 03/02/2018  ? ?No results found for: PROLACTIN ?Lab Results  ?Component Value Date  ? CHOL 152 07/15/2021  ? TRIG 67.0 07/15/2021  ? HDL 61.00 07/15/2021  ? CHOLHDL 2 07/15/2021  ? VLDL 13.4 07/15/2021  ? [place] 77 07/15/2021  ? [place] 60 09/22/2020  ? ?Lab Results  ?Component Value Date  ? TSH 1.02 07/08/2019  ? TSH 0.92 03/15/2018  ? ? ?Therapeutic Level Labs: ?No results found for: LITHIUM ?No results found for: VALPROATE ?No components found for:  CBMZ ? ?Current Medications: ?Current Outpatient Medications  ?Medication Sig Dispense Refill  ? ACCU-CHEK SOFTCLIX LANCETS lancets 3 (three) times daily. for testing  0  ? albuterol (VENTOLIN HFA) 108 (90 Base) MCG/ACT inhaler Inhale 1-2 puffs into the lungs every 6 (six) hours as needed for wheezing or shortness of breath. 18 g 1  ? amantadine (SYMMETREL) 100 MG capsule Take 1  capsule (100 mg total) by mouth 2 (two) times daily. 60 capsule 1  ? Cariprazine HCl (VRAYLAR) 6 MG CAPS TAKE 1 CAPSULE(6 MG) BY MOUTH DAILY 30 capsule 1  ? clonazePAM (KLONOPIN) 1 MG tablet Take 1 tablet (1 mg total) by mouth 3 (three) times daily as needed for anxiety. for anxiety 90 tablet 1  ? fluPHENAZine (PROLIXIN) 5 MG tablet TAKE 1 TABLET(5 MG) BY MOUTH AT BEDTIME 30 tablet 1  ? fluticasone (FLONASE) 50 MCG/ACT nasal spray SHAKE LIQUID AND USE 2 SPRAYS IN EACH NOSTRIL DAILY 16 g 6  ? glipiZIDE (GLUCOTROL) 10 MG tablet  TAKE 1 TABLET(10 MG) BY MOUTH TWICE DAILY 180 tablet 3  ? glucose blood (ACCU-CHEK GUIDE) test strip TEST THREE TIMES DAILY 100 strip 3  ? JARDIANCE 25 MG TABS tablet TAKE 1 TABLET BY MOUTH DAILY 90 tablet 2  ? LUMIGAN 0.01 % SOLN INSTILL 1 DROP INTO AFFECTED EYE ONCE AT BEDTIME    ? meloxicam (MOBIC) 15 MG tablet TAKE 1 TABLET(15 MG) BY MOUTH DAILY 30 tablet 3  ? metFORMIN (GLUCOPHAGE-XR) 500 MG 24 hr tablet TAKE 2 TABLETS(1000 MG) BY MOUTH TWICE DAILY 360 tablet 1  ? montelukast (SINGULAIR) 10 MG tablet TAKE 1 TABLET(10 MG) BY MOUTH AT BEDTIME 90 tablet 3  ? RESTASIS 0.05 % ophthalmic emulsion     ? sertraline (ZOLOFT) 100 MG tablet Take 1 tablet (100 mg total) by mouth daily. 30 tablet 1  ? simvastatin (ZOCOR) 20 MG tablet TAKE 1 TABLET(20 MG) BY MOUTH EVERY EVENING (Patient not taking: Reported on 10/07/2021) 90 tablet 3  ? tadalafil (CIALIS) 20 MG tablet TAKE ONE-HALF TO ONE TABLET BY MOUTH EVERY OTHER DAY AS NEEDED FOR ERECTILE DYSFUNCTION 10 tablet 4  ? triamcinolone (KENALOG) 0.1 % Apply 1 application topically 2 (two) times daily. (Patient not taking: Reported on 10/07/2021) 30 g 0  ? TYRVAYA 0.03 MG/ACT SOLN Spray 1 spray in each nostril twice daily, approximately 12 hours apart    ? ?Current Facility-Administered Medications  ?Medication Dose Route Frequency Provider Last Rate Last Admin  ? methylPREDNISolone acetate (DEPO-MEDROL) injection 80 mg  80 mg Other Once Magnus Sinning, MD       ? ? ? ?  ?Psychiatric Specialty Exam: please note limitations in obtaining  a full MSE in the context of phone communication  ?Review of Systems  ?HENT:  Positive for hearing loss.   ?Psychiatric/Behavio

## 2021-11-26 ENCOUNTER — Other Ambulatory Visit: Payer: Self-pay | Admitting: Family Medicine

## 2021-12-09 ENCOUNTER — Ambulatory Visit (INDEPENDENT_AMBULATORY_CARE_PROVIDER_SITE_OTHER): Payer: Medicare Other | Admitting: Clinical

## 2021-12-09 DIAGNOSIS — F251 Schizoaffective disorder, depressive type: Secondary | ICD-10-CM

## 2021-12-09 NOTE — Progress Notes (Signed)
? ?  THERAPIST PROGRESS NOTE ? ?Session Time: 10am ? ?Participation Level: Active ? ?Behavioral Response: CasualAlertpleasant ? ?Type of Therapy: Individual Therapy ? ?Treatment Goals addressed: healthy coping ? ?ProgressTowards Goals: Progressing ? ?Interventions: Supportive ?Virtual Visit via Video Note ? ?I connected with [name] on 12/09/21 at 10:00 AM EDT by a video enabled telemedicine application and verified that I am speaking with the correct person using two identifiers. ? ?Location: ?Patient: home ?Provider: office ?  ?I discussed the limitations of evaluation and management by telemedicine and the availability of in person appointments. The patient expressed understanding and agreed to proceed. ?  ?I discussed the assessment and treatment plan with the patient. The patient was provided an opportunity to ask questions and all were answered. The patient agreed with the plan and demonstrated an understanding of the instructions. ?  ?The patient was advised to call back or seek an in-person evaluation if the symptoms worsen or if the condition fails to improve as anticipated. ? ?I provided 40 minutes of non-face-to-face time during this encounter.  ?Summary: Writer informed pt last day of employment 4/21; discussed referral process and addressed questions. Pt agreeable to be referred to [facility]). Pt reports decrease in depressive sxs and rates anxiety 5/10 (10=exceptionally well). Pt continues to report paranoia and some difficulty with staying on task. Pt states he is doing well in school. ? ?Diagnosis: schizoaffective disorder, depressive type ? ?Collaboration of Care: Other pt referred to Malka So ? ?Patient/Guardian was advised Release of Information must be obtained prior to any record release in order to collaborate their care with an outside provider. Patient/Guardian was advised if they have not already done so to contact the registration department to sign all  necessary forms in order for [place] to release information regarding their care.  ? ?Consent: Patient/Guardian gives verbal consent for treatment and assignment of benefits for services provided during this visit. Patient/Guardian expressed understanding and agreed to proceed.  ? ?Kodi Guerrera Lynelle Smoke, LCSW ?12/09/2021 ? ?

## 2021-12-14 ENCOUNTER — Ambulatory Visit (HOSPITAL_COMMUNITY): Payer: Medicare Other | Admitting: Psychiatry

## 2021-12-15 ENCOUNTER — Encounter: Payer: Self-pay | Admitting: Family Medicine

## 2021-12-17 DIAGNOSIS — H40023 Open angle with borderline findings, high risk, bilateral: Secondary | ICD-10-CM | POA: Diagnosis not present

## 2021-12-17 DIAGNOSIS — H16223 Keratoconjunctivitis sicca, not specified as Sjogren's, bilateral: Secondary | ICD-10-CM | POA: Diagnosis not present

## 2021-12-17 DIAGNOSIS — E119 Type 2 diabetes mellitus without complications: Secondary | ICD-10-CM | POA: Diagnosis not present

## 2021-12-17 DIAGNOSIS — H5213 Myopia, bilateral: Secondary | ICD-10-CM | POA: Diagnosis not present

## 2021-12-17 LAB — HM DIABETES EYE EXAM

## 2021-12-23 ENCOUNTER — Encounter: Payer: Self-pay | Admitting: Family Medicine

## 2021-12-28 ENCOUNTER — Ambulatory Visit (HOSPITAL_COMMUNITY): Payer: Medicare Other | Admitting: Psychiatry

## 2021-12-30 ENCOUNTER — Ambulatory Visit (INDEPENDENT_AMBULATORY_CARE_PROVIDER_SITE_OTHER): Payer: Medicare Other | Admitting: Surgery

## 2021-12-30 ENCOUNTER — Encounter: Payer: Self-pay | Admitting: Surgery

## 2021-12-30 VITALS — BP 119/83 | HR 89

## 2021-12-30 DIAGNOSIS — M5416 Radiculopathy, lumbar region: Secondary | ICD-10-CM

## 2021-12-30 DIAGNOSIS — M5136 Other intervertebral disc degeneration, lumbar region: Secondary | ICD-10-CM | POA: Diagnosis not present

## 2021-12-30 NOTE — Progress Notes (Signed)
? ?Office Visit Note ?  ?Patient: Glenn Robertson           ?Date of Birth: 11-21-1965           ?MRN: [id number] ?Visit Date: 12/30/2021 ?             ?Requested by: Libby Maw, MD ?[address] ?[place],  [place] [id number] ?PCP: Libby Maw, MD ? ? ?Assessment & Plan: ?Visit Diagnoses:  ?1. Lumbar degenerative disc disease   ?2. Radiculopathy, lumbar region   ? ? ?Plan: At this point recommend formal PT and follow-up with Dr. Lorin Mercy in 5 weeks for recheck.  His exam is unremarkable today.  Dr. Lorin Mercy will make decision as to whether or not he needs repeat lumbar MRI to compare to the study that was done May 2022. ? ?Follow-Up Instructions: Return in about 5 weeks (around 02/03/2022) for WITH DR YATES RECHECK RIGHT LEG PAIN.  .  ? ?Orders:  ?No orders of the defined types were placed in this encounter. ? ?No orders of the defined types were placed in this encounter. ? ? ? ? Procedures: ?No procedures performed ? ? ?Clinical Data: ?No additional findings. ? ? ?Subjective: ?Chief Complaint  ?Patient presents with  ? Right Leg - Pain  ? ? ?HPI ?56 year old white male with known history of lumbar spondylosis and lower extremity radiculopathy comes in today with complaints of left "quadriceps" pain.  Patient states that this has been going on for about a month.  Aggravated with sitting, riding his bike, walking.  Pain is not severe.  He continues to have ongoing issues with his low back and left lower extremity radiculopathy. ?Review of Systems ?No current cardiopulmonary GI/GU issues ? ?Objective: ?Vital Signs: BP 119/83   Pulse 89  ? ?Physical Exam ?HENT:  ?   Head: Normocephalic.  ?Pulmonary:  ?   Effort: No respiratory distress.  ?Musculoskeletal:  ?   Comments: Gait is normal.  No lumbar paraspinal tenderness/spasm.  Negative logroll bilateral hips.  Negative straight leg raise.  No focal motor deficits.  ?Neurological:  ?   Mental Status: He is alert.  ? ? ?Ortho Exam ? ?Specialty Comments:   ?No specialty comments available. ? ?Imaging: ?No results found. ? ? ?PMFS History: ?Patient Active Problem List  ? Diagnosis Date Noted  ? Inclusion cyst 10/07/2021  ? Gait disturbance 10/04/2021  ? Olecranon bursitis of left elbow 05/31/2021  ? Allergic rhinitis 01/18/2021  ? Skin lesion of left ear 01/18/2021  ? Abrasion of left pinna 12/25/2020  ? Abrasion of left knee 12/25/2020  ? Tremor due to multiple drugs 10/02/2020  ? Seborrheic dermatitis 10/02/2020  ? Polyneuropathy 05/12/2020  ? Elevated cholesterol 10/08/2019  ? Benign prostatic hyperplasia with nocturia 10/08/2019  ? Schizoaffective disorder, depressive type ([place]) 11/27/2018  ? Urinary hesitancy 10/08/2018  ? Erectile dysfunction 09/24/2018  ? Numbness 08/20/2018  ? Transient alteration of awareness 08/08/2018  ? Tendinopathy of right gluteus medius 05/22/2018  ? Pain of right hip joint 05/08/2018  ? Hearing difficulty 04/13/2018  ? Trochanteric bursitis, right hip 03/23/2018  ? Reactive airway disease 03/16/2018  ? Headache 03/02/2018  ? Type 2 diabetes mellitus without complication, without long-term current use of insulin ([place]) 02/16/2018  ? Vitamin B 12 deficiency 02/16/2018  ? Healthcare maintenance 02/16/2018  ? Pain in left leg 10/28/2016  ? Lumbosacral radiculopathy at S1 03/15/2016  ? Lumbar degenerative disc disease 12/18/2015  ? Disc displacement, lumbar 04/21/2015  ? Chondromalacia of  both patellae 02/24/2015  ? Paranoid schizophrenia, chronic condition ([place]) 02/24/2015  ? Right anterior knee pain 07/18/2014  ? Right ankle pain 01/27/2014  ? Pain in joint, ankle and foot 01/27/2014  ? Sciatica 01/10/2012  ? Disturbance of skin sensation 11/14/2011  ? ?Past Medical History:  ?Diagnosis Date  ? Allergy   ? dog and cats, citrus  ? Anxiety   ? Asthma   ? Chronic pain   ? Depression   ? Diabetes mellitus   ? Glaucoma   ? Neuromuscular disorder ([place])   ? Paranoid schizophrenia ([place])   ?  ?Family History  ?Problem Relation Age of Onset  ?  Diabetes Father   ? Diabetes Paternal Uncle   ? Colon cancer Neg Hx   ? Colon polyps Neg Hx   ? Esophageal cancer Neg Hx   ? Rectal cancer Neg Hx   ? Stomach cancer Neg Hx   ?  ?Past Surgical History:  ?Procedure Laterality Date  ? ANKLE ARTHROSCOPY    ? Arm Surgery  1/12  ? rt ulnar nerve decompression  ? EPIDURAL BLOCK INJECTION    ? several  ? ULNAR NERVE TRANSPOSITION  01/19/2012  ? Procedure: ULNAR NERVE DECOMPRESSION/TRANSPOSITION;  Surgeon: Cammie Sickle., MD;  Location: [facility];  Service: Orthopedics;  Laterality: Left;  ulnar nerve decompression vs transposition left elbow  ? ?Social History  ? ?Occupational History  ? Occupation: Unemlpoyed-disabled  ?Tobacco Use  ? Smoking status: Never  ? Smokeless tobacco: Never  ?Vaping Use  ? Vaping Use: Never used  ?Substance and Sexual Activity  ? Alcohol use: No  ? Drug use: No  ? Sexual activity: Yes  ? ? ? ? ? ? ?
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
## 2022-01-06 ENCOUNTER — Ambulatory Visit: Payer: Medicare Other | Attending: Surgery

## 2022-01-06 ENCOUNTER — Other Ambulatory Visit: Payer: Self-pay

## 2022-01-06 DIAGNOSIS — R279 Unspecified lack of coordination: Secondary | ICD-10-CM | POA: Insufficient documentation

## 2022-01-06 DIAGNOSIS — M6281 Muscle weakness (generalized): Secondary | ICD-10-CM | POA: Insufficient documentation

## 2022-01-06 DIAGNOSIS — M79604 Pain in right leg: Secondary | ICD-10-CM | POA: Diagnosis not present

## 2022-01-06 DIAGNOSIS — M5416 Radiculopathy, lumbar region: Secondary | ICD-10-CM | POA: Diagnosis not present

## 2022-01-06 DIAGNOSIS — M5459 Other low back pain: Secondary | ICD-10-CM | POA: Insufficient documentation

## 2022-01-06 DIAGNOSIS — M5136 Other intervertebral disc degeneration, lumbar region: Secondary | ICD-10-CM | POA: Insufficient documentation

## 2022-01-06 DIAGNOSIS — R293 Abnormal posture: Secondary | ICD-10-CM | POA: Diagnosis not present

## 2022-01-10 ENCOUNTER — Other Ambulatory Visit (HOSPITAL_COMMUNITY): Payer: Self-pay | Admitting: Psychiatry

## 2022-01-10 ENCOUNTER — Ambulatory Visit (HOSPITAL_BASED_OUTPATIENT_CLINIC_OR_DEPARTMENT_OTHER): Payer: Medicare Other | Admitting: Psychiatry

## 2022-01-10 ENCOUNTER — Encounter (HOSPITAL_COMMUNITY): Payer: Self-pay | Admitting: Psychiatry

## 2022-01-10 DIAGNOSIS — R404 Transient alteration of awareness: Secondary | ICD-10-CM | POA: Diagnosis not present

## 2022-01-10 DIAGNOSIS — F251 Schizoaffective disorder, depressive type: Secondary | ICD-10-CM | POA: Diagnosis not present

## 2022-01-10 MED ORDER — CLONAZEPAM 1 MG PO TABS: 1.0000 mg | ORAL_TABLET | Freq: Three times a day (TID) | ORAL | 1 refills | Status: DC | PRN

## 2022-01-10 MED ORDER — AMANTADINE HCL 100 MG PO CAPS: 100.0000 mg | ORAL_CAPSULE | Freq: Two times a day (BID) | ORAL | 1 refills | Status: DC

## 2022-01-10 MED ORDER — VRAYLAR 6 MG PO CAPS: ORAL_CAPSULE | ORAL | 1 refills | Status: DC

## 2022-01-10 MED ORDER — SERTRALINE HCL 100 MG PO TABS: 100.0000 mg | ORAL_TABLET | Freq: Every day | ORAL | 1 refills | Status: DC

## 2022-01-10 MED ORDER — FLUPHENAZINE HCL 5 MG PO TABS: ORAL_TABLET | ORAL | 1 refills | Status: DC
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
## 2022-01-12 ENCOUNTER — Ambulatory Visit: Payer: Medicare Other

## 2022-01-12 DIAGNOSIS — M5459 Other low back pain: Secondary | ICD-10-CM

## 2022-01-12 DIAGNOSIS — M79604 Pain in right leg: Secondary | ICD-10-CM

## 2022-01-12 DIAGNOSIS — M6281 Muscle weakness (generalized): Secondary | ICD-10-CM

## 2022-01-12 DIAGNOSIS — M5136 Other intervertebral disc degeneration, lumbar region: Secondary | ICD-10-CM | POA: Diagnosis not present

## 2022-01-12 DIAGNOSIS — R293 Abnormal posture: Secondary | ICD-10-CM | POA: Diagnosis not present

## 2022-01-12 DIAGNOSIS — R279 Unspecified lack of coordination: Secondary | ICD-10-CM | POA: Diagnosis not present

## 2022-01-12 NOTE — Therapy (Signed)
?OUTPATIENT PHYSICAL THERAPY TREATMENT NOTE ? ? ?Patient Name: Glenn Robertson ?MRN: [id number] ?DOB:06-24-1966, 56 y.o., male ?Today's Date: 01/12/2022 ? ?PCP: Libby Maw, MD ?REFERRING PROVIDER: Lanae Crumbly, PA-C ? ?END OF SESSION:  ? PT End of Session - 01/12/22 1316   ? ? Visit Number 2   ? Number of Visits 9   ? Date for PT Re-Evaluation 03/10/22   ? Authorization Type MCR/ MCD   ? Authorization Time Period FOTO v6, v10, KX mod v15   ? Progress Note Due on Visit 10   ? PT Start Time 1315   Pt arrived 15 minutes late to appt  ? PT Stop Time 9833   ? PT Time Calculation (min) 30 min   ? Activity Tolerance Patient tolerated treatment well   ? Behavior During Therapy [facility] for tasks assessed/performed   ? ?  ?  ? ?  ? ? ?Past Medical History:  ?Diagnosis Date  ? Allergy   ? dog and cats, citrus  ? Anxiety   ? Asthma   ? Chronic pain   ? Depression   ? Diabetes mellitus   ? Glaucoma   ? Neuromuscular disorder ([place])   ? Paranoid schizophrenia ([place])   ? ?Past Surgical History:  ?Procedure Laterality Date  ? ANKLE ARTHROSCOPY    ? Arm Surgery  1/12  ? rt ulnar nerve decompression  ? EPIDURAL BLOCK INJECTION    ? several  ? ULNAR NERVE TRANSPOSITION  01/19/2012  ? Procedure: ULNAR NERVE DECOMPRESSION/TRANSPOSITION;  Surgeon: Cammie Sickle., MD;  Location: [place];  Service: Orthopedics;  Laterality: Left;  ulnar nerve decompression vs transposition left elbow  ? ?Patient Active Problem List  ? Diagnosis Date Noted  ? Inclusion cyst 10/07/2021  ? Gait disturbance 10/04/2021  ? Olecranon bursitis of left elbow 05/31/2021  ? Allergic rhinitis 01/18/2021  ? Skin lesion of left ear 01/18/2021  ? Abrasion of left pinna 12/25/2020  ? Abrasion of left knee 12/25/2020  ? Tremor due to multiple drugs 10/02/2020  ? Seborrheic dermatitis 10/02/2020  ? Polyneuropathy 05/12/2020  ? Elevated cholesterol 10/08/2019  ? Benign prostatic hyperplasia with nocturia 10/08/2019  ? Schizoaffective disorder,  depressive type ([place]) 11/27/2018  ? Urinary hesitancy 10/08/2018  ? Erectile dysfunction 09/24/2018  ? Numbness 08/20/2018  ? Transient alteration of awareness 08/08/2018  ? Tendinopathy of right gluteus medius 05/22/2018  ? Pain of right hip joint 05/08/2018  ? Hearing difficulty 04/13/2018  ? Trochanteric bursitis, right hip 03/23/2018  ? Reactive airway disease 03/16/2018  ? Headache 03/02/2018  ? Type 2 diabetes mellitus without complication, without long-term current use of insulin ([place]) 02/16/2018  ? Vitamin B 12 deficiency 02/16/2018  ? Healthcare maintenance 02/16/2018  ? Pain in left leg 10/28/2016  ? Lumbosacral radiculopathy at S1 03/15/2016  ? Lumbar degenerative disc disease 12/18/2015  ? Disc displacement, lumbar 04/21/2015  ? Chondromalacia of both patellae 02/24/2015  ? Paranoid schizophrenia, chronic condition ([place]) 02/24/2015  ? Right anterior knee pain 07/18/2014  ? Right ankle pain 01/27/2014  ? Pain in joint, ankle and foot 01/27/2014  ? Sciatica 01/10/2012  ? Disturbance of skin sensation 11/14/2011  ? ? ?REFERRING DIAG:  ?M51.36 (ICD-10-CM) - Lumbar degenerative disc disease  ?M54.16 (ICD-10-CM) - Radiculopathy, lumbar region  ? ? ?THERAPY DIAG:  ?Other low back pain ? ?Muscle weakness (generalized) ? ?Pain in right leg ? ?PERTINENT HISTORY: Paranoid schizophrenia, depression, anxiety, DMII, glaucoma, >20 year hx of LBP with 10  year hx of radicular pain ? ?PRECAUTIONS: None ? ?SUBJECTIVE: Patient reports moderate pain in his R quad. He states he wasn't able to do the split lunges due to not having a good surface to put his foot on. ? ?PAIN:  ?Are you having pain? Yes: NPRS scale:5/10 ?Pain location: Rt Quad, global low back ?Pain description: Rt quad: sharp, low back: dull, stiff ?Aggravating factors: driving > 2 hours, static standing any amount of time, getting out of bed in the morning ?Relieving factors: sitting (after exacerbation due to standing), walking ? ? ?OBJECTIVE: (objective  measures completed at initial evaluation unless otherwise dated) ? ? ?DIAGNOSTIC FINDINGS:  ?01/07/2022: MR Lumbar Spine Without Contrast: IMPRESSION: ?1. Broad-based central 2 left subarticular disc protrusion at L5-S1, ?contacting and potentially irritating the descending left S1 nerve ?root. ?2. Moderate bilateral L5 foraminal stenosis related to disc bulge, ?reactive endplate change, and marrow edema. ?3. Discogenic reactive endplate change with marrow edema about the ?L5-S1 level, which could contribute to lower back pain. ?4. Additional mild noncompressive disc bulging at L2-3 through L4-5 ?without significant stenosis or impingement. ?  ?PATIENT SURVEYS:  ?FOTO: 48%, projected 65% in visits ?  ?SCREENING FOR RED FLAGS: ?Bowel or bladder incontinence: No ?Cauda equina syndrome: No ?  ?COGNITION: ?          Overall cognitive status: Within functional limits for tasks assessed               ?           ?SENSATION: ?WFL ?  ?MUSCLE LENGTH: ?Hamstrings: WNL BIL ?Thomas test: Moderate limitation BIL ?Ely's Test: Mild limitation on Rt, no pain ?  ?POSTURE:  ?Increased lumbar lordosis ?  ?PALPATION: ?No TTP to BIL lumbar paraspinals/ QL; no TTP to Rt quadriceps musculature ?  ?PASSIVE ACCESSORIES: ?CPAs T10-L4 hypomobile and painful ?  ?LUMBAR ROM:  ?  ?Active  AROM  ?01/05/2022  ?Flexion 75d, pain in Rt quad  ?Extension 15d, pain in back  ?Right lateral flexion 22d  ?Left lateral flexion 22d  ?Right rotation 45d  ?Left rotation 45d  ? (Blank rows = not tested) ?  ?  ?LE MMT: ?  ?MMT Right ?01/05/2022 Left ?01/05/2022  ?Hip flexion 4+/5 5/5  ?Hip extension 3/5 3+/5  ?Hip abduction 5/5 5/5  ?Knee flexion 5/5 5/5  ?Knee extension 4+/5, pain in quad 5/5  ?Ankle dorsiflexion 5/5 5/5  ?Ankle plantarflexion 5/5 5/5  ? (Blank rows = not tested) ?  ?LUMBAR SPECIAL TESTS:  ?Slump: (-) BIL ?SLR: (-) BIL, quad pain in Rt ?PLE: (-) ?  ?FUNCTIONAL TESTS:  ?5xSTS: 15.4 seconds ?Squat: 75%, pain in Rt quad ?Plank: 85 seconds ?  ?  ?   ?TODAY'S TREATMENT  ?[facility] Adult PT Treatment:                                                DATE: 01/12/2022 ?Therapeutic Exercise: ?SLR Rt 2x10 ?Modified thomas stretch EOM Rt x1' ?Sidelying open books x10 BIL ?Bridges 5" hold 2x10 ?Single leg bridge 2x10 BIL ?Supine figure 4 piriformis stretch ?Cat/cow x10 ?Child's pose x30" ?Ardine Eng pose with side stretch x30" BIL ?Bird dogs x10 BIL ? ? ?01/05/2022: Demonstrated and issued HEP ?  ?  ?PATIENT EDUCATION:  ?Education details: Pt educated on potential underlying pathophysiology behind his pain presentation, FOTO, prognosis, POC, and HEP ?Person  educated: Patient ?Education method: Explanation, Demonstration, and Handouts ?Education comprehension: verbalized understanding and returned demonstration ?  ?  ?HOME EXERCISE PROGRAM: ?Access Code: [email or web address] ?URL: [email or web address] ?Date: 01/06/2022 ?Prepared by: Vanessa Rudyard ?  ?Exercises ?- Hip Flexor Stretch at Edge of Bed  - 1 x daily - 7 x weekly - 2-min hold ?- Sidelying Open Book Thoracic Lumbar Rotation and Extension  - 1 x daily - 7 x weekly - 2 sets - 10 reps ?- Quadriceps Stretch with Chair  - 1 x daily - 7 x weekly - 2-min hold ?- Single Leg Lunge with Foot on Bench  - 1 x daily - 7 x weekly - 2 sets - 10 reps ?  ?ASSESSMENT: ?  ?CLINICAL IMPRESSION: ?Patient presents to PT with moderate pain in his R quadriceps and reports HEP compliance minus single leg split squats. He arrived 15 minutes late to session, shortening time allotted. Session today focused on R quad strengthening and stretching as well as core strengthening and lumbar ROM. Patient was able to tolerate all prescribed exercises with no adverse effects. Patient continues to benefit from skilled PT services and should be progressed as able to improve functional independence. ?  ?  ?OBJECTIVE IMPAIRMENTS Abnormal gait, decreased balance, decreased mobility, difficulty walking, decreased ROM, decreased strength, hypomobility,  increased edema, impaired flexibility, improper body mechanics, postural dysfunction, and pain.  ?  ?ACTIVITY LIMITATIONS community activity, driving, yard work, and shopping.  ?  ?PERSONAL FACTORS Time since onset of inj

## 2022-01-13 ENCOUNTER — Ambulatory Visit (HOSPITAL_COMMUNITY): Payer: Medicare Other | Admitting: Licensed Clinical Social Worker

## 2022-01-18 NOTE — Therapy (Signed)
OUTPATIENT PHYSICAL THERAPY TREATMENT NOTE   Patient Name: Glenn Robertson MRN: [id number] DOB:10-Apr-1966, 56 y.o., male Today's Date: 01/19/2022  PCP: Libby Maw, MD REFERRING PROVIDER: Lanae Crumbly, PA-C  END OF SESSION:   PT End of Session - 01/19/22 0923     Visit Number 3    Number of Visits 9    Date for PT Re-Evaluation 03/10/22    Authorization Type MCR/ MCD    Authorization Time Period FOTO v6, v10, KX mod v15    Progress Note Due on Visit 10    PT Start Time 0922   Pt arrived 7 minutes late to his appointment.   PT Stop Time 1000    PT Time Calculation (min) 38 min    Activity Tolerance Patient tolerated treatment well    Behavior During Therapy WFL for tasks assessed/performed              Past Medical History:  Diagnosis Date   Allergy    dog and cats, citrus   Anxiety    Asthma    Chronic pain    Depression    Diabetes mellitus    Glaucoma    Neuromuscular disorder ([place])    Paranoid schizophrenia ([place])    Past Surgical History:  Procedure Laterality Date   ANKLE ARTHROSCOPY     Arm Surgery  1/12   rt ulnar nerve decompression   EPIDURAL BLOCK INJECTION     several   ULNAR NERVE TRANSPOSITION  01/19/2012   Procedure: ULNAR NERVE DECOMPRESSION/TRANSPOSITION;  Surgeon: Cammie Sickle., MD;  Location: [place];  Service: Orthopedics;  Laterality: Left;  ulnar nerve decompression vs transposition left elbow   Patient Active Problem List   Diagnosis Date Noted   Inclusion cyst 10/07/2021   Gait disturbance 10/04/2021   Olecranon bursitis of left elbow 05/31/2021   Allergic rhinitis 01/18/2021   Skin lesion of left ear 01/18/2021   Abrasion of left pinna 12/25/2020   Abrasion of left knee 12/25/2020   Tremor due to multiple drugs 10/02/2020   Seborrheic dermatitis 10/02/2020   Polyneuropathy 05/12/2020   Elevated cholesterol 10/08/2019   Benign prostatic hyperplasia with nocturia 10/08/2019   Schizoaffective  disorder, depressive type ([place]) 11/27/2018   Urinary hesitancy 10/08/2018   Erectile dysfunction 09/24/2018   Numbness 08/20/2018   Transient alteration of awareness 08/08/2018   Tendinopathy of right gluteus medius 05/22/2018   Pain of right hip joint 05/08/2018   Hearing difficulty 04/13/2018   Trochanteric bursitis, right hip 03/23/2018   Reactive airway disease 03/16/2018   Headache 03/02/2018   Type 2 diabetes mellitus without complication, without long-term current use of insulin ([place]) 02/16/2018   Vitamin B 12 deficiency 02/16/2018   Healthcare maintenance 02/16/2018   Pain in left leg 10/28/2016   Lumbosacral radiculopathy at S1 03/15/2016   Lumbar degenerative disc disease 12/18/2015   Disc displacement, lumbar 04/21/2015   Chondromalacia of both patellae 02/24/2015   Paranoid schizophrenia, chronic condition ([place]) 02/24/2015   Right anterior knee pain 07/18/2014   Right ankle pain 01/27/2014   Pain in joint, ankle and foot 01/27/2014   Sciatica 01/10/2012   Disturbance of skin sensation 11/14/2011    REFERRING DIAG:  M51.36 (ICD-10-CM) - Lumbar degenerative disc disease  M54.16 (ICD-10-CM) - Radiculopathy, lumbar region    THERAPY DIAG:  Other low back pain  Muscle weakness (generalized)  Pain in right leg  Unspecified lack of coordination  Abnormal posture  PERTINENT HISTORY: Paranoid schizophrenia, depression,  anxiety, DMII, glaucoma, >20 year hx of LBP with 10 year hx of radicular pain  PRECAUTIONS: None  SUBJECTIVE: Pt reports 1-2/10 Rt anterior thigh pain today. He reports adherence to his HEP.  PAIN:  Are you having pain? Yes: NPRS scale: 1-2/10 Pain location: Rt Quad, global low back Pain description: Rt quad: sharp, low back: dull, stiff Aggravating factors: driving > 2 hours, static standing any amount of time, getting out of bed in the morning Relieving factors: sitting (after exacerbation due to standing), walking   OBJECTIVE: (objective  measures completed at initial evaluation unless otherwise dated)   DIAGNOSTIC FINDINGS:  01/07/2022: MR Lumbar Spine Without Contrast: IMPRESSION: 1. Broad-based central 2 left subarticular disc protrusion at L5-S1, contacting and potentially irritating the descending left S1 nerve root. 2. Moderate bilateral L5 foraminal stenosis related to disc bulge, reactive endplate change, and marrow edema. 3. Discogenic reactive endplate change with marrow edema about the L5-S1 level, which could contribute to lower back pain. 4. Additional mild noncompressive disc bulging at L2-3 through L4-5 without significant stenosis or impingement.   PATIENT SURVEYS:  FOTO: 48%, projected 65% in visits   SCREENING FOR RED FLAGS: Bowel or bladder incontinence: No Cauda equina syndrome: No   COGNITION:           Overall cognitive status: Within functional limits for tasks assessed                          SENSATION: WFL   MUSCLE LENGTH: Hamstrings: WNL BIL Thomas test: Moderate limitation BIL Ely's Test: Mild limitation on Rt, no pain   POSTURE:  Increased lumbar lordosis   PALPATION: No TTP to BIL lumbar paraspinals/ QL; no TTP to Rt quadriceps musculature   PASSIVE ACCESSORIES: CPAs T10-L4 hypomobile and painful   LUMBAR ROM:    Active  AROM  01/05/2022  Flexion 75d, pain in Rt quad  Extension 15d, pain in back  Right lateral flexion 22d  Left lateral flexion 22d  Right rotation 45d  Left rotation 45d   (Blank rows = not tested)     LE MMT:   MMT Right 01/05/2022 Left 01/05/2022  Hip flexion 4+/5 5/5  Hip extension 3/5 3+/5  Hip abduction 5/5 5/5  Knee flexion 5/5 5/5  Knee extension 4+/5, pain in quad 5/5  Ankle dorsiflexion 5/5 5/5  Ankle plantarflexion 5/5 5/5   (Blank rows = not tested)   LUMBAR SPECIAL TESTS:  Slump: (-) BIL SLR: (-) BIL, quad pain in Rt PLE: (-)   FUNCTIONAL TESTS:  5xSTS: 15.4 seconds Squat: 75%, pain in Rt quad Plank: 85 seconds        TODAY'S TREATMENT   [facility] Adult PT Treatment:                                                DATE: 01/19/2022 Therapeutic Exercise: Seated eccentric Rt knee extension (2 up, 1 down) with 35# 3x10 Lateral heel taps on 4-inch step 3x10 on Rt Prone quad stretch with green band x2 min on Rt Standing Rt hip flexion with subsequent knee extension with 7# cable attachment to ankle at Free Motion machine 2x10 Standard plank x4 to failure Side knee plank with hip abduction 2x10 BIL Sidelying lumbar open book x10 BIL Manual Therapy: N/A Neuromuscular re-ed: N/A Therapeutic Activity: N/A Modalities: N/A  Self Care: N/A   [facility] Adult PT Treatment:                                                DATE: 01/12/2022 Therapeutic Exercise: SLR Rt 2x10 Modified thomas stretch EOM Rt x1' Sidelying open books x10 BIL Bridges 5" hold 2x10 Single leg bridge 2x10 BIL Supine figure 4 piriformis stretch Cat/cow x10 Child's pose x30" Childs pose with side stretch x30" BIL Bird dogs x10 BIL   01/05/2022: Demonstrated and issued HEP     PATIENT EDUCATION:  Education details: Pt educated on potential underlying pathophysiology behind his pain presentation, FOTO, prognosis, POC, and HEP Person educated: Patient Education method: Explanation, Demonstration, and Handouts Education comprehension: verbalized understanding and returned demonstration     HOME EXERCISE PROGRAM: Access Code: [email or web address] URL: [email or web address] Date: 01/06/2022 Prepared by: Vanessa [employer]yer[employer]euser   Exercises - Hip Flexor Stretch at [facility] of Bed  - 1 x daily - 7 x weekly - 2-min hold - Sidelying Open Book Thoracic Lumbar Rotation and Extension  - 1 x daily - 7 x weekly - 2 sets - 10 reps - Quadriceps Stretch with Chair  - 1 x daily - 7 x weekly - 2-min hold - Single Leg Lunge with Foot on Bench  - 1 x daily - 7 x weekly - 2 sets - 10 reps  Added 01/19/2022: - Standard Plank  - 1 x daily - 7 x weekly - 3 sets -  1-min hold - Modified Side Plank with Hip Abduction  - 1 x daily - 7 x weekly - 2 sets - 10 reps - Seated Long Arc Quad with Ankle Weight  - 1 x daily - 7 x weekly - 3 sets - 10 reps - 3-sec hold   ASSESSMENT:   CLINICAL IMPRESSION: Pt responded well to all interventions today, demonstrating good form and no increase in pain with performed exercises. Treatment focused more on eccentric and closed-chain quad reinforcement today. He will continue to benefit from skilled PT to address his primary impairments and return to his prior level of function with less limitation.     OBJECTIVE IMPAIRMENTS Abnormal gait, decreased balance, decreased mobility, difficulty walking, decreased ROM, decreased strength, hypomobility, increased edema, impaired flexibility, improper body mechanics, postural dysfunction, and pain.    ACTIVITY LIMITATIONS community activity, driving, yard work, and shopping.    PERSONAL FACTORS Time since onset of injury/illness/exacerbation and 3+ comorbidities: Paranoid schizophrenia, depression, anxiety, DMII, glaucoma, >20 year hx of LBP with 10 year hx of radicular pain  are also affecting patient's functional outcome.          GOALS: Goals reviewed with patient? Yes   SHORT TERM GOALS: Target date: 02/03/2022      Pt will report understanding and adherence to his HEP in order to promote independence in the management of his primary impairments. Baseline: HEP provided at eval 01/19/2022: Pt reports adherence to his HEP Goal status: ACHIEVED     LONG TERM GOALS: Target date: 03/03/2022     Pt will achieve an FOTO score of 65% in order to demonstrate improved functional ability as it relates to his LBP. Baseline: 48% Goal status: INITIAL   2.  Pt will achieve lumbar AROM [id number] degrees with 0-2/10 pain in order to get dressed with less limitation. Baseline: 15 degrees with 5/10 pain Goal status:  INITIAL   3.  Pt will report ability to stand for >15 minutes with 0-2/10  pain in order to wash dishes with less limitation. Baseline: Any amount of standing results in >5/10 pain Goal status: INITIAL   4.  Pt will achieve BIL hip extension strength of 4+/5 in order to return to biking for exercise with less limitation. Baseline: Rt: 3/5, Lt: 3+/5 Goal status: INITIAL       PLAN: PT FREQUENCY: 1x/week   PT DURATION: 8 weeks   PLANNED INTERVENTIONS: Therapeutic exercises, Therapeutic activity, Neuromuscular re-education, Balance training, Gait training, Patient/Family education, Joint manipulation, Joint mobilization, Stair training, Aquatic Therapy, Dry Needling, Electrical stimulation, Spinal manipulation, Spinal mobilization, Cryotherapy, Moist heat, Taping, Vasopneumatic device, Traction, Biofeedback, Ionotophoresis '4mg'$ /ml Dexamethasone, and Manual therapy.   PLAN FOR NEXT SESSION: Progress quad reinforcement, lumbar and quadriceps stretching, hip strengthening    Cherie Ouch, PT 01/19/2022, 10:00 AM

## 2022-01-19 ENCOUNTER — Ambulatory Visit: Payer: Medicare Other

## 2022-01-19 DIAGNOSIS — M79604 Pain in right leg: Secondary | ICD-10-CM

## 2022-01-19 DIAGNOSIS — R293 Abnormal posture: Secondary | ICD-10-CM | POA: Diagnosis not present

## 2022-01-19 DIAGNOSIS — R279 Unspecified lack of coordination: Secondary | ICD-10-CM

## 2022-01-19 DIAGNOSIS — M6281 Muscle weakness (generalized): Secondary | ICD-10-CM | POA: Diagnosis not present

## 2022-01-19 DIAGNOSIS — M5136 Other intervertebral disc degeneration, lumbar region: Secondary | ICD-10-CM | POA: Diagnosis not present

## 2022-01-19 DIAGNOSIS — M5459 Other low back pain: Secondary | ICD-10-CM

## 2022-01-21 DIAGNOSIS — E119 Type 2 diabetes mellitus without complications: Secondary | ICD-10-CM | POA: Diagnosis not present

## 2022-01-21 DIAGNOSIS — H40013 Open angle with borderline findings, low risk, bilateral: Secondary | ICD-10-CM | POA: Diagnosis not present

## 2022-01-26 ENCOUNTER — Ambulatory Visit: Payer: Medicare Other

## 2022-01-26 DIAGNOSIS — M5459 Other low back pain: Secondary | ICD-10-CM | POA: Diagnosis not present

## 2022-01-26 DIAGNOSIS — R293 Abnormal posture: Secondary | ICD-10-CM | POA: Diagnosis not present

## 2022-01-26 DIAGNOSIS — R279 Unspecified lack of coordination: Secondary | ICD-10-CM | POA: Diagnosis not present

## 2022-01-26 DIAGNOSIS — M5136 Other intervertebral disc degeneration, lumbar region: Secondary | ICD-10-CM | POA: Diagnosis not present

## 2022-01-26 DIAGNOSIS — M6281 Muscle weakness (generalized): Secondary | ICD-10-CM | POA: Diagnosis not present

## 2022-01-26 DIAGNOSIS — M79604 Pain in right leg: Secondary | ICD-10-CM | POA: Diagnosis not present

## 2022-01-26 NOTE — Therapy (Signed)
OUTPATIENT PHYSICAL THERAPY TREATMENT NOTE   Patient Name: Glenn Robertson MRN: [id number] DOB:1966-06-04, 56 y.o., male Today's Date: 01/26/2022  PCP: Libby Maw, MD REFERRING PROVIDER: Lanae Crumbly, PA-C  END OF SESSION:   PT End of Session - 01/26/22 1302     Visit Number 4    Number of Visits 9    Date for PT Re-Evaluation 03/10/22    Authorization Type MCR/ MCD    Authorization Time Period FOTO v6, v10, KX mod v15    Progress Note Due on Visit 10    PT Start Time 1300    PT Stop Time 1341    PT Time Calculation (min) 41 min    Activity Tolerance Patient tolerated treatment well    Behavior During Therapy WFL for tasks assessed/performed               Past Medical History:  Diagnosis Date   Allergy    dog and cats, citrus   Anxiety    Asthma    Chronic pain    Depression    Diabetes mellitus    Glaucoma    Neuromuscular disorder ([place])    Paranoid schizophrenia ([place])    Past Surgical History:  Procedure Laterality Date   ANKLE ARTHROSCOPY     Arm Surgery  1/12   rt ulnar nerve decompression   EPIDURAL BLOCK INJECTION     several   ULNAR NERVE TRANSPOSITION  01/19/2012   Procedure: ULNAR NERVE DECOMPRESSION/TRANSPOSITION;  Surgeon: Cammie Sickle., MD;  Location: [facility];  Service: Orthopedics;  Laterality: Left;  ulnar nerve decompression vs transposition left elbow   Patient Active Problem List   Diagnosis Date Noted   Inclusion cyst 10/07/2021   Gait disturbance 10/04/2021   Olecranon bursitis of left elbow 05/31/2021   Allergic rhinitis 01/18/2021   Skin lesion of left ear 01/18/2021   Abrasion of left pinna 12/25/2020   Abrasion of left knee 12/25/2020   Tremor due to multiple drugs 10/02/2020   Seborrheic dermatitis 10/02/2020   Polyneuropathy 05/12/2020   Elevated cholesterol 10/08/2019   Benign prostatic hyperplasia with nocturia 10/08/2019   Schizoaffective disorder, depressive type ([place]) 11/27/2018    Urinary hesitancy 10/08/2018   Erectile dysfunction 09/24/2018   Numbness 08/20/2018   Transient alteration of awareness 08/08/2018   Tendinopathy of right gluteus medius 05/22/2018   Pain of right hip joint 05/08/2018   Hearing difficulty 04/13/2018   Trochanteric bursitis, right hip 03/23/2018   Reactive airway disease 03/16/2018   Headache 03/02/2018   Type 2 diabetes mellitus without complication, without long-term current use of insulin ([place]) 02/16/2018   Vitamin B 12 deficiency 02/16/2018   Healthcare maintenance 02/16/2018   Pain in left leg 10/28/2016   Lumbosacral radiculopathy at S1 03/15/2016   Lumbar degenerative disc disease 12/18/2015   Disc displacement, lumbar 04/21/2015   Chondromalacia of both patellae 02/24/2015   Paranoid schizophrenia, chronic condition ([place]) 02/24/2015   Right anterior knee pain 07/18/2014   Right ankle pain 01/27/2014   Pain in joint, ankle and foot 01/27/2014   Sciatica 01/10/2012   Disturbance of skin sensation 11/14/2011    REFERRING DIAG:  M51.36 (ICD-10-CM) - Lumbar degenerative disc disease  M54.16 (ICD-10-CM) - Radiculopathy, lumbar region    THERAPY DIAG:  Other low back pain  Muscle weakness (generalized)  Pain in right leg  PERTINENT HISTORY: Paranoid schizophrenia, depression, anxiety, DMII, glaucoma, >20 year hx of LBP with 10 year hx of radicular pain  PRECAUTIONS: None  SUBJECTIVE: Pt reports no current pain in the Rt quad and reports that it hasn't been hurting lately.  PAIN:  Are you having pain? Yes: NPRS scale: 0/10 Pain location: Rt Quad, global low back Pain description: Rt quad: sharp, low back: dull, stiff Aggravating factors: driving > 2 hours, static standing any amount of time, getting out of bed in the morning Relieving factors: sitting (after exacerbation due to standing), walking   OBJECTIVE: (objective measures completed at initial evaluation unless otherwise dated)   DIAGNOSTIC FINDINGS:   01/07/2022: MR Lumbar Spine Without Contrast: IMPRESSION: 1. Broad-based central 2 left subarticular disc protrusion at L5-S1, contacting and potentially irritating the descending left S1 nerve root. 2. Moderate bilateral L5 foraminal stenosis related to disc bulge, reactive endplate change, and marrow edema. 3. Discogenic reactive endplate change with marrow edema about the L5-S1 level, which could contribute to lower back pain. 4. Additional mild noncompressive disc bulging at L2-3 through L4-5 without significant stenosis or impingement.   PATIENT SURVEYS:  FOTO: 48%, projected 65% in visits   SCREENING FOR RED FLAGS: Bowel or bladder incontinence: No Cauda equina syndrome: No   COGNITION:           Overall cognitive status: Within functional limits for tasks assessed                          SENSATION: WFL   MUSCLE LENGTH: Hamstrings: WNL BIL Thomas test: Moderate limitation BIL Ely's Test: Mild limitation on Rt, no pain   POSTURE:  Increased lumbar lordosis   PALPATION: No TTP to BIL lumbar paraspinals/ QL; no TTP to Rt quadriceps musculature   PASSIVE ACCESSORIES: CPAs T10-L4 hypomobile and painful   LUMBAR ROM:    Active  AROM  01/05/2022  Flexion 75d, pain in Rt quad  Extension 15d, pain in back  Right lateral flexion 22d  Left lateral flexion 22d  Right rotation 45d  Left rotation 45d   (Blank rows = not tested)     LE MMT:   MMT Right 01/05/2022 Left 01/05/2022  Hip flexion 4+/5 5/5  Hip extension 3/5 3+/5  Hip abduction 5/5 5/5  Knee flexion 5/5 5/5  Knee extension 4+/5, pain in quad 5/5  Ankle dorsiflexion 5/5 5/5  Ankle plantarflexion 5/5 5/5   (Blank rows = not tested)   LUMBAR SPECIAL TESTS:  Slump: (-) BIL SLR: (-) BIL, quad pain in Rt PLE: (-)   FUNCTIONAL TESTS:  5xSTS: 15.4 seconds Squat: 75%, pain in Rt quad Plank: 85 seconds       TODAY'S TREATMENT  [facility] Adult PT Treatment:                                                 DATE: 01/26/2022 Therapeutic Exercise: Seated eccentric Rt knee extension (2 up, 1 down) with 35# 3x10 Lateral heel taps on 4-inch step 3x10 on Rt Prone quad stretch with green band x2 min on Rt Prone press ups x10 Standard plank x1' Side knee plank with hip abduction x10 BIL Sidelying lumbar open book x10 BIL Cybex hip extension 25# 2x10 BIL Palloff press 10# 2x10 BIL   [facility] Adult PT Treatment:  DATE: 01/19/2022 Therapeutic Exercise: Seated eccentric Rt knee extension (2 up, 1 down) with 35# 3x10 Lateral heel taps on 4-inch step 3x10 on Rt Prone quad stretch with green band x2 min on Rt Standing Rt hip flexion with subsequent knee extension with 7# cable attachment to ankle at Free Motion machine 2x10 Standard plank x4 to failure Side knee plank with hip abduction 2x10 BIL Sidelying lumbar open book x10 BIL Manual Therapy: N/A Neuromuscular re-ed: N/A Therapeutic Activity: N/A Modalities: N/A Self Care: N/A   [facility] Adult PT Treatment:                                                DATE: 01/12/2022 Therapeutic Exercise: SLR Rt 2x10 Modified thomas stretch EOM Rt x1' Sidelying open books x10 BIL Bridges 5" hold 2x10 Single leg bridge 2x10 BIL Supine figure 4 piriformis stretch Cat/cow x10 Child's pose x30" Childs pose with side stretch x30" BIL Bird dogs x10 BIL     PATIENT EDUCATION:  Education details: Pt educated on potential underlying pathophysiology behind his pain presentation, FOTO, prognosis, POC, and HEP Person educated: Patient Education method: Consulting civil engineer, Demonstration, and Handouts Education comprehension: verbalized understanding and returned demonstration     HOME EXERCISE PROGRAM: Access Code: [email or web address] URL: [email or web address] Date: 01/06/2022 Prepared by: Vanessa [place]   Exercises - Hip Flexor Stretch at [facility] of Bed  - 1 x daily - 7 x weekly - 2-min hold - Sidelying Open  Book Thoracic Lumbar Rotation and Extension  - 1 x daily - 7 x weekly - 2 sets - 10 reps - Quadriceps Stretch with Chair  - 1 x daily - 7 x weekly - 2-min hold - Single Leg Lunge with Foot on Bench  - 1 x daily - 7 x weekly - 2 sets - 10 reps  Added 01/19/2022: - Standard Plank  - 1 x daily - 7 x weekly - 3 sets - 1-min hold - Modified Side Plank with Hip Abduction  - 1 x daily - 7 x weekly - 2 sets - 10 reps - Seated Long Arc Quad with Ankle Weight  - 1 x daily - 7 x weekly - 3 sets - 10 reps - 3-sec hold   ASSESSMENT:   CLINICAL IMPRESSION: Patient presents to PT with no current pain in his anterior thigh and reports HEP compliance. He reports he has had some mild LBP recently. Session today focused on RLE strengthening, particularly the quadriceps, as well as core strengthening. Patient was able to tolerate all prescribed exercises with no adverse effects. Patient continues to benefit from skilled PT services and should be progressed as able to improve functional independence.    OBJECTIVE IMPAIRMENTS Abnormal gait, decreased balance, decreased mobility, difficulty walking, decreased ROM, decreased strength, hypomobility, increased edema, impaired flexibility, improper body mechanics, postural dysfunction, and pain.    ACTIVITY LIMITATIONS community activity, driving, yard work, and shopping.    PERSONAL FACTORS Time since onset of injury/illness/exacerbation and 3+ comorbidities: Paranoid schizophrenia, depression, anxiety, DMII, glaucoma, >20 year hx of LBP with 10 year hx of radicular pain  are also affecting patient's functional outcome.          GOALS: Goals reviewed with patient? Yes   SHORT TERM GOALS: Target date: 02/03/2022      Pt will report understanding and adherence to his HEP in order to promote independence  in the management of his primary impairments. Baseline: HEP provided at eval 01/19/2022: Pt reports adherence to his HEP Goal status: ACHIEVED     LONG TERM GOALS:  Target date: 03/03/2022     Pt will achieve an FOTO score of 65% in order to demonstrate improved functional ability as it relates to his LBP. Baseline: 48% Goal status: INITIAL   2.  Pt will achieve lumbar AROM [id number] degrees with 0-2/10 pain in order to get dressed with less limitation. Baseline: 15 degrees with 5/10 pain Goal status: INITIAL   3.  Pt will report ability to stand for >15 minutes with 0-2/10 pain in order to wash dishes with less limitation. Baseline: Any amount of standing results in >5/10 pain Goal status: INITIAL   4.  Pt will achieve BIL hip extension strength of 4+/5 in order to return to biking for exercise with less limitation. Baseline: Rt: 3/5, Lt: 3+/5 Goal status: INITIAL       PLAN: PT FREQUENCY: 1x/week   PT DURATION: 8 weeks   PLANNED INTERVENTIONS: Therapeutic exercises, Therapeutic activity, Neuromuscular re-education, Balance training, Gait training, Patient/Family education, Joint manipulation, Joint mobilization, Stair training, Aquatic Therapy, Dry Needling, Electrical stimulation, Spinal manipulation, Spinal mobilization, Cryotherapy, Moist heat, Taping, Vasopneumatic device, Traction, Biofeedback, Ionotophoresis '4mg'$ /ml Dexamethasone, and Manual therapy.   PLAN FOR NEXT SESSION: Progress quad reinforcement, lumbar and quadriceps stretching, hip strengthening    SEvelene Croon PTA 01/26/2022, 1:41 PM

## 2022-02-02 ENCOUNTER — Ambulatory Visit: Payer: Medicare Other

## 2022-02-02 ENCOUNTER — Ambulatory Visit (INDEPENDENT_AMBULATORY_CARE_PROVIDER_SITE_OTHER): Payer: Medicare Other | Admitting: Orthopaedic Surgery

## 2022-02-02 VITALS — BP 112/74 | HR 86

## 2022-02-02 DIAGNOSIS — M51369 Other intervertebral disc degeneration, lumbar region without mention of lumbar back pain or lower extremity pain: Secondary | ICD-10-CM

## 2022-02-02 DIAGNOSIS — M5136 Other intervertebral disc degeneration, lumbar region: Secondary | ICD-10-CM

## 2022-02-02 NOTE — Progress Notes (Signed)
Office Visit Note   Patient: Glenn Robertson           Date of Birth: [birth year]           MRN: [id number] Visit Date: 02/02/2022              Requested by: Libby Maw, MD [address] [place],  [place] [address] PCP: Libby Maw, MD   Assessment & Plan: Visit Diagnoses:  1. Lumbar degenerative disc disease     Plan: We discussed work activities that are not likely to bother his lumbar symptoms.  He fell he tries to run it irritates his back although sometimes he had read in the past and it was better tolerated.  He has done better riding a bicycle outside but not currently doing his to high ozone and asthma problems.  He has had previous epidural gave him temporary relief but recurrence of symptoms similar to previous problems.  We will check him back again on as-needed basis.  Activity modification recommendations made.  Follow-Up Instructions: Return if symptoms worsen or fail to improve.   Orders:  No orders of the defined types were placed in this encounter.  No orders of the defined types were placed in this encounter.     Procedures: No procedures performed   Clinical Data: No additional findings.   Subjective: Chief Complaint  Patient presents with   Right Leg - Pain   Lower Back - Pain    HPI 56 year old male returns with ongoing intermittent problems with his back.  He had some discomfort in his right thigh states that is gotten better.  Patient has schizoaffective disorder depression type.  He has had problems with slight disc bulge L5-S1 more on the left than right.  Last MRI 2022 his MRI at that time was unchanged from previous MRI.  He has had problems when he tries to run.  He does not like to work at indoors due to his mental problems.  He rides a bike outside but currently not doing it due to high ozone levels.  No associated bowel or bladder symptoms.  Review of Systems 14 point system update unchanged.   Objective: Vital  Signs: BP 112/74   Pulse 86   Physical Exam Constitutional:      Appearance: He is well-developed.  HENT:     Head: Normocephalic and atraumatic.     Right Ear: External ear normal.     Left Ear: External ear normal.  Eyes:     Pupils: Pupils are equal, round, and reactive to light.  Neck:     Thyroid: No thyromegaly.     Trachea: No tracheal deviation.  Cardiovascular:     Rate and Rhythm: Normal rate.  Pulmonary:     Effort: Pulmonary effort is normal.     Breath sounds: No wheezing.  Abdominal:     General: Bowel sounds are normal.     Palpations: Abdomen is soft.  Musculoskeletal:     Cervical back: Neck supple.  Skin:    General: Skin is warm and dry.     Capillary Refill: Capillary refill takes less than 2 seconds.  Neurological:     Mental Status: He is alert and oriented to person, place, and time.  Psychiatric:        Behavior: Behavior normal.        Thought Content: Thought content normal.        Judgment: Judgment normal.    Ortho Exam patient  is able to heel and toe walk after sitting standing no trochanteric bursal tenderness.  No problems with figure-of-four position in sitting position right or left.  Knees reach full extension.  No quad atrophy.  Specialty Comments:  No specialty comments available.  Imaging: CLINICAL DATA:  Initial evaluation for worsening back pain with left lower extremity radiculopathy.   EXAM: MRI LUMBAR SPINE WITHOUT CONTRAST   TECHNIQUE: Multiplanar, multisequence MR imaging of the lumbar spine was performed. No intravenous contrast was administered.   COMPARISON:  Previous radiograph from 11/11/2020 as well as prior MRI from 06/15/2019.   FINDINGS: Segmentation: Standard. Lowest well-formed disc space labeled the L5-S1 level.   Alignment: Physiologic with preservation of the normal lumbar lordosis. No listhesis.   Vertebrae: Vertebral body height maintained without acute or chronic fracture. Bone marrow signal  intensity within normal limits. Probable small atypical hemangioma noted within the T12 vertebral body, stable. No other discrete or worrisome osseous lesions. Discogenic reactive endplate change with marrow edema present about the L5-S1 interspace. No other abnormal marrow edema.   Conus medullaris and cauda equina: Conus extends to the L1-2 level. Conus and cauda equina appear normal.   Paraspinal and other soft tissues: Paraspinous soft tissues within normal limits. Visualized visceral structures are unremarkable.   Disc levels:   L1-2:  Unremarkable.   L2-3: Mild degenerative intervertebral disc space narrowing with disc bulge and disc desiccation. Associated mild reactive endplate spurring. No spinal stenosis. No more than mild bilateral foraminal narrowing. No impingement. Appearance is stable.   L3-4: Mild degenerative intervertebral disc space narrowing with diffuse disc bulge and disc desiccation. Minimal reactive endplate spurring. No spinal stenosis. Mild bilateral L3 foraminal narrowing without frank impingement. Appearance is stable.   L4-5: Minimal disc bulge, slightly eccentric to the left. Annular fissure at the level of the left neural foramen. No significant spinal stenosis. Mild bilateral L4 foraminal narrowing, slightly worse on the left. No frank impingement. Appearance is stable.   L5-S1: Advanced degenerative intervertebral disc space narrowing with disc desiccation and diffuse disc bulge. Associated reactive endplate change with marginal endplate osteophytic spurring and marrow edema. Superimposed broad-based central to left subarticular disc protrusion contacts the descending left S1 nerve root as it courses through the left lateral recess (series 5, image 39). No frank neural impingement. Mild epidural lipomatosis. No significant canal or lateral recess stenosis. Moderate bilateral L5 foraminal stenosis.   IMPRESSION: 1. Broad-based central 2 left  subarticular disc protrusion at L5-S1, contacting and potentially irritating the descending left S1 nerve root. 2. Moderate bilateral L5 foraminal stenosis related to disc bulge, reactive endplate change, and marrow edema. 3. Discogenic reactive endplate change with marrow edema about the L5-S1 level, which could contribute to lower back pain. 4. Additional mild noncompressive disc bulging at L2-3 through L4-5 without significant stenosis or impingement.     Electronically Signed   By: Jeannine Boga M.D.   On: 01/08/2021 04:14     PMFS History: Patient Active Problem List   Diagnosis Date Noted   Inclusion cyst 10/07/2021   Gait disturbance 10/04/2021   Olecranon bursitis of left elbow 05/31/2021   Allergic rhinitis 01/18/2021   Skin lesion of left ear 01/18/2021   Abrasion of left pinna 12/25/2020   Abrasion of left knee 12/25/2020   Tremor due to multiple drugs 10/02/2020   Seborrheic dermatitis 10/02/2020   Polyneuropathy 05/12/2020   Elevated cholesterol 10/08/2019   Benign prostatic hyperplasia with nocturia 10/08/2019   Schizoaffective disorder, depressive type ([place])  11/27/2018   Urinary hesitancy 10/08/2018   Erectile dysfunction 09/24/2018   Numbness 08/20/2018   Transient alteration of awareness 08/08/2018   Tendinopathy of right gluteus medius 05/22/2018   Pain of right hip joint 05/08/2018   Hearing difficulty 04/13/2018   Trochanteric bursitis, right hip 03/23/2018   Reactive airway disease 03/16/2018   Headache 03/02/2018   Type 2 diabetes mellitus without complication, without long-term current use of insulin ([place]) 02/16/2018   Vitamin B 12 deficiency 02/16/2018   Healthcare maintenance 02/16/2018   Pain in left leg 10/28/2016   Lumbosacral radiculopathy at S1 03/15/2016   Lumbar degenerative disc disease 12/18/2015   Disc displacement, lumbar 04/21/2015   Chondromalacia of both patellae 02/24/2015   Paranoid schizophrenia, chronic condition  ([place]) 02/24/2015   Right anterior knee pain 07/18/2014   Right ankle pain 01/27/2014   Pain in joint, ankle and foot 01/27/2014   Sciatica 01/10/2012   Disturbance of skin sensation 11/14/2011   Past Medical History:  Diagnosis Date   Allergy    dog and cats, citrus   Anxiety    Asthma    Chronic pain    Depression    Diabetes mellitus    Glaucoma    Neuromuscular disorder ([place])    Paranoid schizophrenia ([place])     Family History  Problem Relation Age of Onset   Diabetes Father    Diabetes Paternal Uncle    Colon cancer Neg Hx    Colon polyps Neg Hx    Esophageal cancer Neg Hx    Rectal cancer Neg Hx    Stomach cancer Neg Hx     Past Surgical History:  Procedure Laterality Date   ANKLE ARTHROSCOPY     Arm Surgery  1/12   rt ulnar nerve decompression   EPIDURAL BLOCK INJECTION     several   ULNAR NERVE TRANSPOSITION  01/19/2012   Procedure: ULNAR NERVE DECOMPRESSION/TRANSPOSITION;  Surgeon: Cammie Sickle., MD;  Location: [facility];  Service: Orthopedics;  Laterality: Left;  ulnar nerve decompression vs transposition left elbow   Social History   Occupational History   Occupation: Unemlpoyed-disabled  Tobacco Use   Smoking status: Never   Smokeless tobacco: Never  Vaping Use   Vaping Use: Never used  Substance and Sexual Activity   Alcohol use: No   Drug use: No   Sexual activity: Yes

## 2022-02-08 ENCOUNTER — Other Ambulatory Visit (HOSPITAL_COMMUNITY): Payer: Self-pay | Admitting: Psychiatry

## 2022-02-08 ENCOUNTER — Ambulatory Visit: Payer: Medicare Other

## 2022-02-15 ENCOUNTER — Encounter: Payer: Self-pay | Admitting: Family Medicine

## 2022-02-16 ENCOUNTER — Telehealth (HOSPITAL_COMMUNITY): Payer: Self-pay | Admitting: *Deleted

## 2022-02-16 ENCOUNTER — Ambulatory Visit: Payer: Medicare Other | Attending: Surgery

## 2022-02-16 DIAGNOSIS — M6281 Muscle weakness (generalized): Secondary | ICD-10-CM | POA: Insufficient documentation

## 2022-02-16 DIAGNOSIS — M5459 Other low back pain: Secondary | ICD-10-CM | POA: Diagnosis not present

## 2022-02-16 DIAGNOSIS — M79604 Pain in right leg: Secondary | ICD-10-CM | POA: Diagnosis not present

## 2022-02-16 NOTE — Therapy (Signed)
OUTPATIENT PHYSICAL THERAPY TREATMENT NOTE   Patient Name: Glenn Robertson MRN: [id number] DOB:01-23-1966, 56 y.o., male Today's Date: 02/16/2022  PCP: Libby Maw, MD REFERRING PROVIDER: Lanae Crumbly, PA-C  END OF SESSION:   PT End of Session - 02/16/22 1614     Visit Number 5    Number of Visits 9    Date for PT Re-Evaluation 03/10/22    Authorization Type MCR/ MCD    Authorization Time Period FOTO v6, v10, KX mod v15    Progress Note Due on Visit 10    PT Start Time 1615    PT Stop Time 1655    PT Time Calculation (min) 40 min    Activity Tolerance Patient tolerated treatment well    Behavior During Therapy WFL for tasks assessed/performed                Past Medical History:  Diagnosis Date   Allergy    dog and cats, citrus   Anxiety    Asthma    Chronic pain    Depression    Diabetes mellitus    Glaucoma    Neuromuscular disorder ([place])    Paranoid schizophrenia ([place])    Past Surgical History:  Procedure Laterality Date   ANKLE ARTHROSCOPY     Arm Surgery  1/12   rt ulnar nerve decompression   EPIDURAL BLOCK INJECTION     several   ULNAR NERVE TRANSPOSITION  01/19/2012   Procedure: ULNAR NERVE DECOMPRESSION/TRANSPOSITION;  Surgeon: Cammie Sickle., MD;  Location: [facility];  Service: Orthopedics;  Laterality: Left;  ulnar nerve decompression vs transposition left elbow   Patient Active Problem List   Diagnosis Date Noted   Inclusion cyst 10/07/2021   Gait disturbance 10/04/2021   Olecranon bursitis of left elbow 05/31/2021   Allergic rhinitis 01/18/2021   Skin lesion of left ear 01/18/2021   Abrasion of left pinna 12/25/2020   Abrasion of left knee 12/25/2020   Tremor due to multiple drugs 10/02/2020   Seborrheic dermatitis 10/02/2020   Polyneuropathy 05/12/2020   Elevated cholesterol 10/08/2019   Benign prostatic hyperplasia with nocturia 10/08/2019   Schizoaffective disorder, depressive type ([place]) 11/27/2018    Urinary hesitancy 10/08/2018   Erectile dysfunction 09/24/2018   Numbness 08/20/2018   Transient alteration of awareness 08/08/2018   Tendinopathy of right gluteus medius 05/22/2018   Pain of right hip joint 05/08/2018   Hearing difficulty 04/13/2018   Trochanteric bursitis, right hip 03/23/2018   Reactive airway disease 03/16/2018   Headache 03/02/2018   Type 2 diabetes mellitus without complication, without long-term current use of insulin ([place]) 02/16/2018   Vitamin B 12 deficiency 02/16/2018   Healthcare maintenance 02/16/2018   Pain in left leg 10/28/2016   Lumbosacral radiculopathy at S1 03/15/2016   Lumbar degenerative disc disease 12/18/2015   Disc displacement, lumbar 04/21/2015   Chondromalacia of both patellae 02/24/2015   Paranoid schizophrenia, chronic condition ([place]) 02/24/2015   Right anterior knee pain 07/18/2014   Right ankle pain 01/27/2014   Pain in joint, ankle and foot 01/27/2014   Sciatica 01/10/2012   Disturbance of skin sensation 11/14/2011    REFERRING DIAG:  M51.36 (ICD-10-CM) - Lumbar degenerative disc disease  M54.16 (ICD-10-CM) - Radiculopathy, lumbar region    THERAPY DIAG:  Other low back pain  Muscle weakness (generalized)  Pain in right leg  PERTINENT HISTORY: Paranoid schizophrenia, depression, anxiety, DMII, glaucoma, >20 year hx of LBP with 10 year hx of radicular pain  PRECAUTIONS: None  SUBJECTIVE: Pt reports his Rt quad pain is greatly improved, not experiencing any pain the past few weeks. He also reports improved back pain, reporting no pain currently. He reports doing his his HEP nearly every day.  PAIN:  Are you having pain? Yes: NPRS scale: 0/10 Pain location: Rt Quad, global low back Pain description: Rt quad: sharp, low back: dull, stiff Aggravating factors: driving > 2 hours, static standing any amount of time, getting out of bed in the morning Relieving factors: sitting (after exacerbation due to standing),  walking   OBJECTIVE: (objective measures completed at initial evaluation unless otherwise dated)   DIAGNOSTIC FINDINGS:  01/07/2022: MR Lumbar Spine Without Contrast: IMPRESSION: 1. Broad-based central 2 left subarticular disc protrusion at L5-S1, contacting and potentially irritating the descending left S1 nerve root. 2. Moderate bilateral L5 foraminal stenosis related to disc bulge, reactive endplate change, and marrow edema. 3. Discogenic reactive endplate change with marrow edema about the L5-S1 level, which could contribute to lower back pain. 4. Additional mild noncompressive disc bulging at L2-3 through L4-5 without significant stenosis or impingement.   PATIENT SURVEYS:  FOTO: 48%, projected 65% in visits   SCREENING FOR RED FLAGS: Bowel or bladder incontinence: No Cauda equina syndrome: No   COGNITION:           Overall cognitive status: Within functional limits for tasks assessed                          SENSATION: WFL   MUSCLE LENGTH: Hamstrings: WNL BIL Thomas test: Moderate limitation BIL Ely's Test: Mild limitation on Rt, no pain   POSTURE:  Increased lumbar lordosis   PALPATION: No TTP to BIL lumbar paraspinals/ QL; no TTP to Rt quadriceps musculature   PASSIVE ACCESSORIES: CPAs T10-L4 hypomobile and painful   LUMBAR ROM:    Active  AROM  01/05/2022  Flexion 75d, pain in Rt quad  Extension 15d, pain in back  Right lateral flexion 22d  Left lateral flexion 22d  Right rotation 45d  Left rotation 45d   (Blank rows = not tested)     LE MMT:   MMT Right 01/05/2022 Left 01/05/2022 Right 02/16/2022 Left 02/16/2022  Hip flexion 4+/5 5/5 5/5   Hip extension 3/5 3+/5 5/5 5/5  Hip abduction 5/5 5/5    Knee flexion 5/5 5/5    Knee extension 4+/5, pain in quad 5/5 5/5   Ankle dorsiflexion 5/5 5/5    Ankle plantarflexion 5/5 5/5     (Blank rows = not tested)   LUMBAR SPECIAL TESTS:  Slump: (-) BIL SLR: (-) BIL, quad pain in Rt PLE: (-)    FUNCTIONAL TESTS:  5xSTS: 15.4 seconds Squat: 75%, pain in Rt quad Plank: 85 seconds  02/16/2022: 11 seconds       TODAY'S TREATMENT   [facility] Adult PT Treatment:                                                DATE: 02/16/2022 Therapeutic Exercise: Straight arm reverse crunches with 2kg ball hand-off to knees 3x8 Prone superman with GTB around ankles, added hip abduction 3x10 Prone cobra stretch 2x30sec Bridge isometric with marching with GTB above knees 3x20 DKTC stretch 2x30sec Manual Therapy: N/A Neuromuscular re-ed: N/A Therapeutic Activity: Re-assessment of objective measures with pt education  [place] deadlift with 75# hex bar 3x6 Modalities: N/A Self Care: N/A   [facility] Adult PT Treatment:                                                DATE: 01/26/2022 Therapeutic Exercise: Seated eccentric Rt knee extension (2 up, 1 down) with 35# 3x10 Lateral heel taps on 4-inch step 3x10 on Rt Prone quad stretch with green band x2 min on Rt Prone press ups x10 Standard plank x1' Side knee plank with hip abduction x10 BIL Sidelying lumbar open book x10 BIL Cybex hip extension 25# 2x10 BIL Palloff press 10# 2x10 BIL   [facility] Adult PT Treatment:                                                DATE: 01/19/2022 Therapeutic Exercise: Seated eccentric Rt knee extension (2 up, 1 down) with 35# 3x10 Lateral heel taps on 4-inch step 3x10 on Rt Prone quad stretch with green band x2 min on Rt Standing Rt hip flexion with subsequent knee extension with 7# cable attachment to ankle at Free Motion machine 2x10 Standard plank x4 to failure Side knee plank with hip abduction 2x10 BIL Sidelying lumbar open book x10 BIL Manual Therapy: N/A Neuromuscular re-ed: N/A Therapeutic Activity: N/A Modalities: N/A Self Care: N/A      PATIENT EDUCATION:  Education details: Pt educated on potential underlying pathophysiology behind his pain presentation, FOTO, prognosis, POC, and HEP Person  educated: Patient Education method: Explanation, Demonstration, and Handouts Education comprehension: verbalized understanding and returned demonstration     HOME EXERCISE PROGRAM: Access Code: [email or web address] URL: [email or web address] Date: 01/06/2022 Prepared by: Vanessa [place]   Exercises - Hip Flexor Stretch at [facility] of Bed  - 1 x daily - 7 x weekly - 2-min hold - Sidelying Open Book Thoracic Lumbar Rotation and Extension  - 1 x daily - 7 x weekly - 2 sets - 10 reps - Quadriceps Stretch with Chair  - 1 x daily - 7 x weekly - 2-min hold - Single Leg Lunge with Foot on Bench  - 1 x daily - 7 x weekly - 2 sets - 10 reps  Added 01/19/2022: - Standard Plank  - 1 x daily - 7 x weekly - 3 sets - 1-min hold - Modified Side Plank with Hip Abduction  - 1 x daily - 7 x weekly - 2 sets - 10 reps - Seated Long Arc Quad with Ankle Weight  - 1 x daily - 7 x weekly - 3 sets - 10 reps - 3-sec hold   ASSESSMENT:   CLINICAL IMPRESSION: Pt responded well to progressed strengthening interventions today, demonstrating good form and no increase in pain with selected exercises. Upon re-assessment of objective measures, the pt has made excellent improvements in his 5xSTS and global LE strength. He will continue to benefit from skilled PT to address his primary impairments and return to his prior level of function with less limitation.   OBJECTIVE IMPAIRMENTS Abnormal gait, decreased balance, decreased mobility, difficulty walking, decreased ROM, decreased strength, hypomobility, increased edema, impaired flexibility, improper body mechanics, postural dysfunction, and pain.    ACTIVITY LIMITATIONS community activity, driving, yard work, and shopping.    PERSONAL  FACTORS Time since onset of injury/illness/exacerbation and 3+ comorbidities: Paranoid schizophrenia, depression, anxiety, DMII, glaucoma, >20 year hx of LBP with 10 year hx of radicular pain  are also affecting patient's functional  outcome.          GOALS: Goals reviewed with patient? Yes   SHORT TERM GOALS: Target date: 02/03/2022      Pt will report understanding and adherence to his HEP in order to promote independence in the management of his primary impairments. Baseline: HEP provided at eval 01/19/2022: Pt reports adherence to his HEP Goal status: ACHIEVED     LONG TERM GOALS: Target date: 03/03/2022     Pt will achieve an FOTO score of 65% in order to demonstrate improved functional ability as it relates to his LBP. Baseline: 48% Goal status: INITIAL   2.  Pt will achieve lumbar AROM [id number] degrees with 0-2/10 pain in order to get dressed with less limitation. Baseline: 15 degrees with 5/10 pain Goal status: INITIAL   3.  Pt will report ability to stand for >15 minutes with 0-2/10 pain in order to wash dishes with less limitation. Baseline: Any amount of standing results in >5/10 pain Goal status: INITIAL   4.  Pt will achieve BIL hip extension strength of 4+/5 in order to return to biking for exercise with less limitation. Baseline: Rt: 3/5, Lt: 3+/5 02/16/2022: 5/5 BIL Goal status: ACHIEVED       PLAN: PT FREQUENCY: 1x/week   PT DURATION: 8 weeks   PLANNED INTERVENTIONS: Therapeutic exercises, Therapeutic activity, Neuromuscular re-education, Balance training, Gait training, Patient/Family education, Joint manipulation, Joint mobilization, Stair training, Aquatic Therapy, Dry Needling, Electrical stimulation, Spinal manipulation, Spinal mobilization, Cryotherapy, Moist heat, Taping, Vasopneumatic device, Traction, Biofeedback, Ionotophoresis '4mg'$ /ml Dexamethasone, and Manual therapy.   PLAN FOR NEXT SESSION: Progress quad reinforcement, lumbar and quadriceps stretching, hip strengthening    TCherie Ouch PT 02/16/2022, 4:56 PM

## 2022-02-16 NOTE — Telephone Encounter (Signed)
Writer spoke with pt who called with c/o akathisia which pt believes is r/t Dietitian. Pt is also on oral Prolixin 5 mg qhs, as well as Zoloft and Klonopin. Pt does take Amantadine 100 mg bid prophylactically for EPS. Pt denies any muscle rigidity, neck stiffness, or tight jaw muscles. Pt denies all. However pt feels that he is experiencing what sound like fine tremors in hands bi laterally. Pt next appointment is om 02/21/22. Please review.

## 2022-02-21 ENCOUNTER — Encounter (HOSPITAL_COMMUNITY): Payer: Self-pay | Admitting: Psychiatry

## 2022-02-21 ENCOUNTER — Ambulatory Visit (HOSPITAL_BASED_OUTPATIENT_CLINIC_OR_DEPARTMENT_OTHER): Payer: Medicare Other | Admitting: Psychiatry

## 2022-02-21 DIAGNOSIS — R404 Transient alteration of awareness: Secondary | ICD-10-CM

## 2022-02-21 DIAGNOSIS — F251 Schizoaffective disorder, depressive type: Secondary | ICD-10-CM

## 2022-02-21 MED ORDER — SERTRALINE HCL 100 MG PO TABS
100.0000 mg | ORAL_TABLET | Freq: Every day | ORAL | 1 refills | Status: DC
Start: 2022-02-21 — End: 2022-03-25

## 2022-02-21 MED ORDER — CLONAZEPAM 1 MG PO TABS: 1.0000 mg | ORAL_TABLET | Freq: Three times a day (TID) | ORAL | 1 refills | Status: DC | PRN

## 2022-02-21 MED ORDER — VRAYLAR 6 MG PO CAPS: ORAL_CAPSULE | ORAL | 1 refills | Status: DC

## 2022-02-21 MED ORDER — AMANTADINE HCL 100 MG PO CAPS: 100.0000 mg | ORAL_CAPSULE | Freq: Two times a day (BID) | ORAL | 1 refills | Status: DC

## 2022-02-21 MED ORDER — FLUPHENAZINE HCL 5 MG PO TABS: ORAL_TABLET | ORAL | 1 refills | Status: DC

## 2022-02-21 NOTE — Progress Notes (Signed)
Patient ID: Glenn Robertson, male   DOB: [birth year], 56 y.o.   MRN: [id number]  .BH MD/PA/NP OP Progress Note 02/21/2022 [name]  MRN:  [id number]   Chief Complaint:  Patient returns for medication management  This was a face-to-face, in office appointment.  Duration -25 minutes  HPI: 56yo male, who has been diagnosed with schizoaffective disorder. On disability. Lives with mother . He was being followed by Dr . Hinton Dyer, who has left the clinic, and I am providing follow ups /bridging until new provider starts .  Reports he is doing " all right". States he is studying [place]  and is reading a book on [place] history.   [name] called clinic recently, describing  feeling psycho-motorically restless\ describing symptoms suggestive of akathisia  Currently he presents alert, attentive, calm, without noticeable psychomotor restlessness or agitation . He appeared comfortable in waiting room prior to appointment, sitting comfortably /reading on approach.  He was also able to sit comfortably throughout visit today without increased movements or restlessness.  No abnormal or involuntary movements were noted .   He reports mood is " all right". Denies depression. Affect tends to be blunted /restricted. Denies hallucinations and does not present internally preoccupied . Reports he has not recently had any experiences of being followed or monitored and did not express any overt paranoid or persecutory ideations today.    Denies depression or sadness . Affect presents somewhat blunted and restricted . No thought disorder was noted . Denies SI, denies HI and denies having confronted or having any plan to confront anyone regarding persecutory ideations.   As above, currently he does not present with overt akathisia and appears comfortable , in no distress. He states his recent feeling of restlessness could be related to not exercising as much , and states that in part due to back pain has been  less physically active , not walking as much and has not yet ridden his bicycle this year , which he states has been a preferred physical activity .   We reviewed medications and possible medication side effects, including potential risk of psychomotor side effects ( TD, akathisia, EPS) . He reports that he has " felt the best I ever have " on current medication regimen and reports that once he was started on Prolixin in addition to [place] he feels he made noticeable improvement . His preference is to continue current medications/doses and not to taper/decrease antipsychotic med doses at this time.  He is also on  Amantadine and on Klonopin. Takes Klonopin regularly, denies misuse /abuse . Denies sedation/drowsiness .                           Visit Diagnosis:  No diagnosis found.  Past Psychiatric History: Please see intake H&P.  Past Medical History:  Past Medical History:  Diagnosis Date   Allergy    dog and cats, citrus   Anxiety    Asthma    Chronic pain    Depression    Diabetes mellitus    Glaucoma    Neuromuscular disorder (HCC)    Paranoid schizophrenia (HCC)     Past Surgical History:  Procedure Laterality Date   ANKLE ARTHROSCOPY     Arm Surgery  1/12   rt ulnar nerve decompression   EPIDURAL BLOCK INJECTION     several   ULNAR NERVE TRANSPOSITION  01/19/2012   Procedure: ULNAR NERVE DECOMPRESSION/TRANSPOSITION;  Surgeon: Wyn Forster.,  MD;  Location: [facility];  Service: Orthopedics;  Laterality: Left;  ulnar nerve decompression vs transposition left elbow    Family Psychiatric History: None.  Family History:  Family History  Problem Relation Age of Onset   Diabetes Father    Diabetes Paternal Uncle    Colon cancer Neg Hx    Colon polyps Neg Hx    Esophageal cancer Neg Hx    Rectal cancer Neg Hx    Stomach cancer Neg Hx     Social History:  Social History   Socioeconomic History   Marital status: Single     Spouse name: Not on file   Number of children: 0   Years of education: BA   Highest education level: Not on file  Occupational History   Occupation: Unemlpoyed-disabled  Tobacco Use   Smoking status: Never   Smokeless tobacco: Never  Vaping Use   Vaping Use: Never used  Substance and Sexual Activity   Alcohol use: No   Drug use: No   Sexual activity: Yes  Other Topics Concern   Not on file  Social History Narrative   Lives with mother   Caffeine use: Coke very rare   Right handed    Social Determinants of Health   Financial Resource Strain: Low Risk  (09/21/2021)   Overall Financial Resource Strain (CARDIA)    Difficulty of Paying Living Expenses: Not hard at all  Food Insecurity: No Food Insecurity (09/21/2021)   Hunger Vital Sign    Worried About Running Out of Food in the Last Year: Never true    Ran Out of Food in the Last Year: Never true  Transportation Needs: No Transportation Needs (09/21/2021)   PRAPARE - Administrator, Civil Service (Medical): No    Lack of Transportation (Non-Medical): No  Physical Activity: Insufficiently Active (09/21/2021)   Exercise Vital Sign    Days of Exercise per Week: 1 day    Minutes of Exercise per Session: 20 min  Stress: No Stress Concern Present (09/21/2021)   [employer] of Occupational Health - Occupational Stress Questionnaire    Feeling of Stress : Not at all  Social Connections: Socially Isolated (09/21/2021)   Social Connection and Isolation Panel [NHANES]    Frequency of Communication with Friends and Family: Three times a week    Frequency of Social Gatherings with Friends and Family: Three times a week    Attends Religious Services: Never    Active Member of Clubs or Organizations: No    Attends Banker Meetings: Not on file    Marital Status: Never married    Allergies:  Allergies  Allergen Reactions   Citric Acid Other (See Comments)    Runny nose, eyes water   Food     Oranges or  anything with citric acid   Gabapentin     Walking into things, hard to maintain balance, falls    Metabolic Disorder Labs: Lab Results  Component Value Date   HGBA1C 7.7 (H) 07/15/2021   MPG 123 03/02/2018   No results found for: "PROLACTIN" Lab Results  Component Value Date   CHOL 152 07/15/2021   TRIG 67.0 07/15/2021   HDL 61.00 07/15/2021   CHOLHDL 2 07/15/2021   VLDL 13.4 07/15/2021   LDLCALC 77 07/15/2021   LDLCALC 60 09/22/2020   Lab Results  Component Value Date   TSH 1.02 07/08/2019   TSH 0.92 03/15/2018    Therapeutic Level Labs: No results found  for: "LITHIUM" No results found for: "VALPROATE" No results found for: "CBMZ"  Current Medications: Current Outpatient Medications  Medication Sig Dispense Refill   ACCU-CHEK SOFTCLIX LANCETS lancets 3 (three) times daily. for testing  0   albuterol (VENTOLIN HFA) 108 (90 Base) MCG/ACT inhaler Inhale 1-2 puffs into the lungs every 6 (six) hours as needed for wheezing or shortness of breath. 18 g 1   amantadine (SYMMETREL) 100 MG capsule Take 1 capsule (100 mg total) by mouth 2 (two) times daily. 60 capsule 1   Cariprazine HCl (VRAYLAR) 6 MG CAPS TAKE 1 CAPSULE(6 MG) BY MOUTH DAILY 30 capsule 1   clonazePAM (KLONOPIN) 1 MG tablet Take 1 tablet (1 mg total) by mouth 3 (three) times daily as needed for anxiety. for anxiety 90 tablet 1   fluPHENAZine (PROLIXIN) 5 MG tablet TAKE 1 TABLET(5 MG) BY MOUTH AT BEDTIME 30 tablet 1   fluticasone (FLONASE) 50 MCG/ACT nasal spray SHAKE LIQUID AND USE 2 SPRAYS IN EACH NOSTRIL DAILY 16 g 6   glipiZIDE (GLUCOTROL) 10 MG tablet TAKE 1 TABLET(10 MG) BY MOUTH TWICE DAILY 180 tablet 3   glucose blood (ACCU-CHEK GUIDE) test strip TEST THREE TIMES DAILY 100 strip 3   JARDIANCE 25 MG TABS tablet TAKE 1 TABLET BY MOUTH DAILY 90 tablet 2   LUMIGAN 0.01 % SOLN INSTILL 1 DROP INTO AFFECTED EYE ONCE AT BEDTIME     metFORMIN (GLUCOPHAGE-XR) 500 MG 24 hr tablet TAKE 2 TABLETS(1000 MG) BY MOUTH  TWICE DAILY 360 tablet 1   montelukast (SINGULAIR) 10 MG tablet TAKE 1 TABLET(10 MG) BY MOUTH AT BEDTIME 90 tablet 3   RESTASIS 0.05 % ophthalmic emulsion  (Patient not taking: Reported on 01/06/2022)     sertraline (ZOLOFT) 100 MG tablet Take 1 tablet (100 mg total) by mouth daily. 30 tablet 1   simvastatin (ZOCOR) 20 MG tablet TAKE 1 TABLET(20 MG) BY MOUTH EVERY EVENING (Patient not taking: Reported on 10/07/2021) 90 tablet 3   tadalafil (CIALIS) 20 MG tablet TAKE ONE-HALF TO ONE TABLET BY MOUTH EVERY OTHER DAY AS NEEDED FOR ERECTILE DYSFUNCTION 10 tablet 4   triamcinolone (KENALOG) 0.1 % Apply 1 application topically 2 (two) times daily. (Patient not taking: Reported on 10/07/2021) 30 g 0   TYRVAYA 0.03 MG/ACT SOLN Spray 1 spray in each nostril twice daily, approximately 12 hours apart     Current Facility-Administered Medications  Medication Dose Route Frequency Provider Last Rate Last Admin   methylPREDNISolone acetate (DEPO-MEDROL) injection 80 mg  80 mg Other Once Tyrell Antonio, MD           Psychiatric Specialty Exam:  ROS reports back pain   There were no vitals taken for this visit.There is no height or weight on file to calculate BMI.  General Appearance: well groomed, makes good eye contact, polite, cooperative, no psychomotor agitation or restlessness   Eye Contact: as above  Speech:  Normal / fluent   Volume:  Normal  Mood:  mood reported as " same" , does not endorse depression  Affect: tends to be blunted, restricted   Thought Process:  Goal Directed/ presents linear   Orientation:  Full (Time, Place, and Person)  Thought Content: Denies hallucinations and does not present internally preoccupied .   History of chronic paranoid ideations, but reports he has not had any recent experiences of being followed or monitored and does not appear to be focused on or particularly concerned about persecutory ideations   Suicidal Thoughts:  No  denies SI or self injurious ideations   Homicidal Thoughts:  No  denies HI /violent ideations  Memory: recent and remote grossly intact   Judgement:  present   Insight:  Fair  Psychomotor Activity:  No akathisia noted at this time and able to sit comfortably during session, without restlessness ,AIMS test done - negative for involuntary movements /dyskinesias   Concentration:  Good   Recall:  Good  Fund of Knowledge: Good  Language: Good  Akathisia:  Negative  Handed:  Right  AIMS (if indicated): as above   Assets:  Communication Skills Desire for Improvement Financial Resources/Insurance Housing  ADL's:  Intact  Cognition: WNL  Sleep:  reports sleeping " OK"    Screenings: PHQ2-9    Flowsheet Row Office Visit from 10/07/2021 in [facility] Clinical Support from 09/21/2021 in [facility] Counselor from 08/18/2021 in [facility] [place] Office Visit from 12/25/2020 in [facility] Office Visit from 04/06/2020 in [facility]  PHQ-2 Total Score 0 0 6 1 1   PHQ-9 Total Score -- -- 15 -- --      Flowsheet Row Counselor from 08/18/2021 in [facility] [place] [facility] from 06/13/2021 in [facility]  C-SSRS RISK CATEGORY No Risk No Risk        Assessment and Plan: 56 yo single AAM with schizophrenia paranoid type vs schizoaffective disorder. On disability, lives with mother  Presents for face to face / in office appointment .  He had recently contacted office reporting psychomotor restlessness which I suspected could be related to akathisia, since he is on Vraylar/Prolixin combination.  However, today he presents calm/comfortable and was able to sit comfortably in waiting room before appointment and also comfortable without restlessness throughout session which lasted close to 30 minutes.  He attributes increased restlessness to being less physically active due  to some increased back pain recently.  He does plan to start riding his bike again soon which he thinks will help.  We did review medication side effect profile to include potential risk of movement disorders such as TD and akathisia on antipsychotic medications.  He clearly states that the combination of Prolixin/Vraylar has been effective and states that on these meds he has felt "the best I ever have".  He has a history of chronic paranoid /persecutory ideations but at this time does not express paranoid delusions and seems less focused on these concerns.  We reviewed options and his preference is to continue current regimen.  Plan:  Continue Zoloft 100 mg  QDAY for depression, anxiety Continue Vraylar 6 mg for paranoia, psychosis Continue Klonopin 1 mg TID PRN for anxiety Continue Amantadine 100 mg BID to minimize tremors /EPS  Continue Fluphenazine 5 mg QHS for paranoia , psychosis   Next appointment in approximately in 4-6 weeks .  Encourage  psychotherapy  Agrees to contact clinic sooner if any worsening or concern prior. He has clinic number and crisis hotline number if needed .      Craige Cotta, MD 02/21/2022, 1:05 PM Patient ID: Glenn Robertson, male   DOB: [birth year], 56 y.o.   MRN: [id number]

## 2022-02-22 ENCOUNTER — Ambulatory Visit: Payer: Medicare Other

## 2022-02-22 DIAGNOSIS — M6281 Muscle weakness (generalized): Secondary | ICD-10-CM

## 2022-02-22 DIAGNOSIS — M5459 Other low back pain: Secondary | ICD-10-CM | POA: Diagnosis not present

## 2022-02-22 DIAGNOSIS — M79604 Pain in right leg: Secondary | ICD-10-CM | POA: Diagnosis not present

## 2022-03-03 ENCOUNTER — Ambulatory Visit: Payer: Medicare Other

## 2022-03-09 ENCOUNTER — Ambulatory Visit: Payer: Medicare Other

## 2022-03-11 ENCOUNTER — Ambulatory Visit: Payer: Medicare Other | Attending: Surgery

## 2022-03-11 DIAGNOSIS — M5459 Other low back pain: Secondary | ICD-10-CM | POA: Diagnosis not present

## 2022-03-11 DIAGNOSIS — M6281 Muscle weakness (generalized): Secondary | ICD-10-CM | POA: Diagnosis not present

## 2022-03-11 DIAGNOSIS — M79604 Pain in right leg: Secondary | ICD-10-CM | POA: Diagnosis not present
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
## 2022-03-25 ENCOUNTER — Ambulatory Visit (HOSPITAL_BASED_OUTPATIENT_CLINIC_OR_DEPARTMENT_OTHER): Payer: Medicare Other | Admitting: Psychiatry

## 2022-03-25 ENCOUNTER — Encounter (HOSPITAL_COMMUNITY): Payer: Self-pay | Admitting: Psychiatry

## 2022-03-25 DIAGNOSIS — F251 Schizoaffective disorder, depressive type: Secondary | ICD-10-CM | POA: Diagnosis not present

## 2022-03-25 DIAGNOSIS — R404 Transient alteration of awareness: Secondary | ICD-10-CM

## 2022-03-25 MED ORDER — VRAYLAR 6 MG PO CAPS: ORAL_CAPSULE | ORAL | 1 refills | Status: DC

## 2022-03-25 MED ORDER — AMANTADINE HCL 100 MG PO CAPS: 100.0000 mg | ORAL_CAPSULE | Freq: Two times a day (BID) | ORAL | 1 refills | Status: DC

## 2022-03-25 MED ORDER — SERTRALINE HCL 100 MG PO TABS: 100.0000 mg | ORAL_TABLET | Freq: Every day | ORAL | 1 refills | Status: DC

## 2022-03-25 MED ORDER — FLUPHENAZINE HCL 5 MG PO TABS: ORAL_TABLET | ORAL | 1 refills | Status: DC

## 2022-03-25 MED ORDER — CLONAZEPAM 1 MG PO TABS: 1.0000 mg | ORAL_TABLET | Freq: Three times a day (TID) | ORAL | 1 refills | Status: DC | PRN
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
## 2022-04-04 ENCOUNTER — Other Ambulatory Visit: Payer: Self-pay | Admitting: Family Medicine

## 2022-04-04 DIAGNOSIS — E119 Type 2 diabetes mellitus without complications: Secondary | ICD-10-CM

## 2022-04-06 ENCOUNTER — Other Ambulatory Visit: Payer: Self-pay | Admitting: Family Medicine

## 2022-04-06 DIAGNOSIS — E119 Type 2 diabetes mellitus without complications: Secondary | ICD-10-CM

## 2022-04-18 ENCOUNTER — Encounter (HOSPITAL_COMMUNITY): Payer: Self-pay | Admitting: Psychiatry

## 2022-04-18 ENCOUNTER — Telehealth (HOSPITAL_BASED_OUTPATIENT_CLINIC_OR_DEPARTMENT_OTHER): Payer: Medicare Other | Admitting: Psychiatry

## 2022-04-18 DIAGNOSIS — F251 Schizoaffective disorder, depressive type: Secondary | ICD-10-CM | POA: Diagnosis not present

## 2022-04-18 DIAGNOSIS — R404 Transient alteration of awareness: Secondary | ICD-10-CM | POA: Diagnosis not present

## 2022-04-18 MED ORDER — VRAYLAR 6 MG PO CAPS: ORAL_CAPSULE | ORAL | 1 refills | Status: DC

## 2022-04-18 MED ORDER — AMANTADINE HCL 100 MG PO CAPS
100.0000 mg | ORAL_CAPSULE | Freq: Two times a day (BID) | ORAL | 1 refills | Status: DC
Start: 2022-04-18 — End: 2022-06-01

## 2022-04-18 MED ORDER — SERTRALINE HCL 100 MG PO TABS: 100.0000 mg | ORAL_TABLET | Freq: Every day | ORAL | 1 refills | Status: DC

## 2022-04-18 MED ORDER — FLUPHENAZINE HCL 5 MG PO TABS: ORAL_TABLET | ORAL | 1 refills | Status: DC

## 2022-04-18 MED ORDER — CLONAZEPAM 1 MG PO TABS: 1.0000 mg | ORAL_TABLET | Freq: Three times a day (TID) | ORAL | 1 refills | Status: DC | PRN
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
## 2022-04-21 ENCOUNTER — Encounter: Payer: Self-pay | Admitting: Family Medicine

## 2022-04-30 ENCOUNTER — Other Ambulatory Visit: Payer: Self-pay | Admitting: Family Medicine

## 2022-05-04 ENCOUNTER — Encounter: Payer: Self-pay | Admitting: Family Medicine

## 2022-05-12 ENCOUNTER — Ambulatory Visit (INDEPENDENT_AMBULATORY_CARE_PROVIDER_SITE_OTHER): Payer: Medicare Other | Admitting: Licensed Clinical Social Worker

## 2022-05-12 DIAGNOSIS — F251 Schizoaffective disorder, depressive type: Secondary | ICD-10-CM

## 2022-05-13 ENCOUNTER — Encounter (HOSPITAL_COMMUNITY): Payer: Self-pay

## 2022-05-13 NOTE — Plan of Care (Signed)
  Problem: Mood disorder Goal:  Reduce overall frequency, intensity, and duration of the anxiety so that daily functioning is not impaired per pt self report 3 out of 5 sessions documented.   Outcome: Initial Goal: Develop healthy thinking patterns and beliefs about self, others, and the world that lead to the alleviation and help prevent the relapse of depression per pt report 3 out of 5 sessions documented Outcome: Initial    Developed/revised tx plan based on pt self reported input. Pt verbally agrees with treatment plan at time of session and consents to sign digitally.
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
## 2022-05-23 ENCOUNTER — Other Ambulatory Visit: Payer: Self-pay | Admitting: Family Medicine

## 2022-05-26 DIAGNOSIS — Z23 Encounter for immunization: Secondary | ICD-10-CM | POA: Diagnosis not present

## 2022-05-30 ENCOUNTER — Encounter: Payer: Self-pay | Admitting: Neurology

## 2022-05-30 ENCOUNTER — Ambulatory Visit (INDEPENDENT_AMBULATORY_CARE_PROVIDER_SITE_OTHER): Payer: Medicare Other | Admitting: Neurology

## 2022-05-30 VITALS — BP 112/76 | HR 75 | Ht 71.0 in | Wt 202.2 lb

## 2022-05-30 DIAGNOSIS — M5417 Radiculopathy, lumbosacral region: Secondary | ICD-10-CM

## 2022-05-30 DIAGNOSIS — R269 Unspecified abnormalities of gait and mobility: Secondary | ICD-10-CM

## 2022-05-30 DIAGNOSIS — M5432 Sciatica, left side: Secondary | ICD-10-CM | POA: Diagnosis not present

## 2022-05-30 DIAGNOSIS — R200 Anesthesia of skin: Secondary | ICD-10-CM

## 2022-05-30 DIAGNOSIS — R404 Transient alteration of awareness: Secondary | ICD-10-CM

## 2022-05-30 NOTE — Progress Notes (Signed)
[facility]  PATIENT: Glenn Robertson DOB: [birth year]  REFERRING DOCTOR OR PCP: Luetta Nutting SOURCE: Patient, notes from PCP, laboratory tests  _________________________________   HISTORICAL  CHIEF COMPLAINT:  Chief Complaint  Patient presents with   Follow-up    Pt in room # 16 and alone.    HISTORY OF PRESENT ILLNESS:  He is a 56 y.o. man with gait issues and occasional spells of transient alteration of awareness  He feels gait is similar to earlier this year.   However he is still off balanced with his gait.   He did ride a bike a few times this year.    Neither leg feels weak.   When he walks he veers some and he says he feels he might fall if he leans backwards.   He has had a few stumbles and one fall when he fell asleep in a chair.  He feels he is overall weak as not exercising anymore.     He has numbness and pain in both feet.  He has sciatica type symptoms bilaterally.   He has had left sided sciatica starting 2012 that improved but recently worsened.  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   He had ESI in the past with short-term benefit. Last one January at Ortho  He could not tolerate gabapentin or Lyrica.   Lamotrigine had not helped.   Due to DM cannot take NSAIDs chronically.   He also has some numbness in his hands.   He has had ulnar nerve transpositions in the past.      He still notes episodes where he feels he loses time.  Sometimes he feels he is partially blacked out but not completely blacked out and can still hear what is around him.  EEG in 09/2018  was normal.   No actual seizure activity ever noted.    He has Type 2 dm and is on metformin, glucotrol and Jardience.   No polyneuropathy.  On NCV  NCV/EMG 05/12/2021 shows the following: 1.   No evidence of sensory, motor or autonomic polyneuropathy 2.   Mild chronic left S1 radiculopathy  MRI cervical spine was normal.      REVIEW OF  SYSTEMS: Constitutional: No fevers, chills, sweats, or change in appetite Eyes: No visual changes, double vision, eye pain Ear, nose and throat: No hearing loss, ear pain, nasal congestion, sore throat Cardiovascular: No chest pain, palpitations Respiratory:  No shortness of breath at rest or with exertion.   No wheezes.  He snores. GastrointestinaI: No nausea, vomiting, diarrhea, abdominal pain, fecal incontinence Genitourinary:  No dysuria, urinary retention or frequency.  No nocturia. Musculoskeletal:  No neck pain, back pain Integumentary: No rash, pruritus, skin lesions Neurological: as above Psychiatric: He has a diagnosis of paranoid schizophrenia.   Endocrine: No palpitations, diaphoresis, change in appetite, change in weigh or increased thirst Hematologic/Lymphatic:  No anemia, purpura, petechiae. Allergic/Immunologic: No itchy/runny eyes, nasal congestion, recent allergic reactions, rashes  ALLERGIES: Allergies  Allergen Reactions   Citric Acid Other (See Comments)    Runny nose, eyes water   Food     Oranges or anything with citric acid   Gabapentin     Walking into things, hard to maintain balance, falls    HOME MEDICATIONS:  Current Outpatient Medications:    ACCU-CHEK SOFTCLIX LANCETS lancets, 3 (three) times daily. for testing, Disp: , Rfl: 0   albuterol (VENTOLIN HFA) 108 (90 Base)  MCG/ACT inhaler, Inhale 1-2 puffs into the lungs every 6 (six) hours as needed for wheezing or shortness of breath., Disp: 18 g, Rfl: 1   amantadine (SYMMETREL) 100 MG capsule, Take 1 capsule (100 mg total) by mouth 2 (two) times daily., Disp: 60 capsule, Rfl: 1   Cariprazine HCl (VRAYLAR) 6 MG CAPS, TAKE 1 CAPSULE(6 MG) BY MOUTH DAILY, Disp: 30 capsule, Rfl: 1   clonazePAM (KLONOPIN) 1 MG tablet, Take 1 tablet (1 mg total) by mouth 3 (three) times daily as needed for anxiety. for anxiety, Disp: 90 tablet, Rfl: 1   fluPHENAZine (PROLIXIN) 5 MG tablet, TAKE HALF A  TABLET (2.5 MG) BY MOUTH  AT BEDTIME, Disp: 15 tablet, Rfl: 1   fluticasone (FLONASE) 50 MCG/ACT nasal spray, SHAKE LIQUID AND USE 2 SPRAYS IN EACH NOSTRIL DAILY, Disp: 16 g, Rfl: 6   glipiZIDE (GLUCOTROL) 10 MG tablet, TAKE 1 TABLET(10 MG) BY MOUTH TWICE DAILY, Disp: 180 tablet, Rfl: 3   glucose blood (ACCU-CHEK GUIDE) test strip, USE TO TEST 3 TIMES DAILY, Disp: 100 strip, Rfl: 3   JARDIANCE 25 MG TABS tablet, TAKE 1 TABLET BY MOUTH DAILY, Disp: 90 tablet, Rfl: 2   LUMIGAN 0.01 % SOLN, INSTILL 1 DROP INTO AFFECTED EYE ONCE AT BEDTIME, Disp: , Rfl:    metFORMIN (GLUCOPHAGE-XR) 500 MG 24 hr tablet, TAKE 2 TABLETS(1000 MG) BY MOUTH TWICE DAILY, Disp: 360 tablet, Rfl: 0   montelukast (SINGULAIR) 10 MG tablet, TAKE 1 TABLET(10 MG) BY MOUTH AT BEDTIME, Disp: 90 tablet, Rfl: 3   RESTASIS 0.05 % ophthalmic emulsion, , Disp: , Rfl:    sertraline (ZOLOFT) 100 MG tablet, Take 1 tablet (100 mg total) by mouth daily., Disp: 30 tablet, Rfl: 1   simvastatin (ZOCOR) 20 MG tablet, TAKE 1 TABLET(20 MG) BY MOUTH EVERY EVENING, Disp: 90 tablet, Rfl: 3   tadalafil (CIALIS) 20 MG tablet, TAKE ONE-HALF TO ONE TABLET BY MOUTH EVERY OTHER DAY AS NEEDED FOR ERECTILE DYSFUNCTION, Disp: 10 tablet, Rfl: 4   triamcinolone (KENALOG) 0.1 %, Apply 1 application topically 2 (two) times daily., Disp: 30 g, Rfl: 0   TYRVAYA 0.03 MG/ACT SOLN, Spray 1 spray in each nostril twice daily, approximately 12 hours apart, Disp: , Rfl:   Current Facility-Administered Medications:    methylPREDNISolone acetate (DEPO-MEDROL) injection 80 mg, 80 mg, Other, Once, Magnus Sinning, MD  PAST MEDICAL HISTORY: Past Medical History:  Diagnosis Date   Allergy    dog and cats, citrus   Anxiety    Asthma    Chronic pain    Depression    Diabetes mellitus    Glaucoma    Neuromuscular disorder ([place])    Paranoid schizophrenia ([place])     PAST SURGICAL HISTORY: Past Surgical History:  Procedure Laterality Date   ANKLE ARTHROSCOPY     Arm Surgery  1/12   rt ulnar  nerve decompression   EPIDURAL BLOCK INJECTION     several   ULNAR NERVE TRANSPOSITION  01/19/2012   Procedure: ULNAR NERVE DECOMPRESSION/TRANSPOSITION;  Surgeon: Cammie Sickle., MD;  Location: [facility];  Service: Orthopedics;  Laterality: Left;  ulnar nerve decompression vs transposition left elbow    FAMILY HISTORY: Family History  Problem Relation Age of Onset   Diabetes Father    Diabetes Paternal Uncle    Colon cancer Neg Hx    Colon polyps Neg Hx    Esophageal cancer Neg Hx    Rectal cancer Neg Hx    Stomach  cancer Neg Hx     SOCIAL HISTORY:  Social History   Socioeconomic History   Marital status: Single    Spouse name: Not on file   Number of children: 0   Years of education: BA   Highest education level: Not on file  Occupational History   Occupation: Unemlpoyed-disabled  Tobacco Use   Smoking status: Never   Smokeless tobacco: Never  Vaping Use   Vaping Use: Never used  Substance and Sexual Activity   Alcohol use: No   Drug use: No   Sexual activity: Yes  Other Topics Concern   Not on file  Social History Narrative   Lives with mother   Caffeine use: Coke very rare   Right handed    Social Determinants of Health   Financial Resource Strain: Low Risk  (09/21/2021)   Overall Financial Resource Strain (CARDIA)    Difficulty of Paying Living Expenses: Not hard at all  Food Insecurity: No Food Insecurity (09/21/2021)   Hunger Vital Sign    Worried About Running Out of Food in the Last Year: Never true    Ran Out of Food in the Last Year: Never true  Transportation Needs: No Transportation Needs (09/21/2021)   PRAPARE - Hydrologist (Medical): No    Lack of Transportation (Non-Medical): No  Physical Activity: Insufficiently Active (09/21/2021)   Exercise Vital Sign    Days of Exercise per Week: 1 day    Minutes of Exercise per Session: 20 min  Stress: No Stress Concern Present (09/21/2021)   [place]    Feeling of Stress : Not at all  Social Connections: Socially Isolated (09/21/2021)   Social Connection and Isolation Panel [NHANES]    Frequency of Communication with Friends and Family: Three times a week    Frequency of Social Gatherings with Friends and Family: Three times a week    Attends Religious Services: Never    Active Member of Clubs or Organizations: No    Attends Archivist Meetings: Not on file    Marital Status: Never married  Intimate Partner Violence: Not At Risk (09/21/2021)   Humiliation, Afraid, Rape, and Kick questionnaire    Fear of Current or Ex-Partner: No    Emotionally Abused: No    Physically Abused: No    Sexually Abused: No     PHYSICAL EXAM  Vitals:   05/30/22 0939  BP: 112/76  Pulse: 75  Weight: 202 lb 3.2 oz (91.7 kg)  Height: '5\' 11"'$  (1.803 m)    Body mass index is 28.2 kg/m.   General: The patient is well-developed and well-nourished and in no acute distress   Skin: Extremities are without rash or edema.  Neurologic Exam  Mental status: The patient is alert and oriented x 3 at the time of the examination. The patient has apparent normal recent and remote memory, with an apparently normal attention span and concentration ability.   Speech is normal.  Cranial nerves: Extraocular movements are full.  .Facial strength and sensation were normal.  No obvious hearing deficits are noted but Weber lateralized to the left  Motor:  Muscle bulk is normal.   Tone is normal. Strength is  5 / 5 in all 4 extremities.   Sensory: Sensory testing is intact to pinprick, soft touch and vibration sensation in the arms.  He reports near normal vibration sensation in the toes and normal at ankles .Reduced S1  sensation in feet to touch  Coordination: Cerebellar testing reveals good finger-nose-finger and heel-to-shin bilaterally.  Gait and station: Station is normal.   Gait is  normal. Tandem gait is minimally wide. Romberg is negative.   Reflexes: Deep tendon reflexes are symmetric and normal bilaterally.       DIAGNOSTIC DATA (LABS, IMAGING, TESTING) - I reviewed patient records, labs, notes, testing and imaging myself where available.  Lab Results  Component Value Date   WBC 7.0 07/15/2021   HGB 15.3 07/15/2021   HCT 48.2 07/15/2021   MCV 87.6 07/15/2021   PLT 191.0 07/15/2021      Component Value Date/Time   NA 137 07/15/2021 0833   K 4.1 07/15/2021 0833   CL 101 07/15/2021 0833   CO2 29 07/15/2021 0833   GLUCOSE 77 07/15/2021 0833   BUN 14 07/15/2021 0833   CREATININE 1.15 07/15/2021 0833   CREATININE 1.10 03/02/2018 1451   CALCIUM 9.6 07/15/2021 0833   PROT 7.3 07/15/2021 0833   ALBUMIN 4.8 07/15/2021 0833   AST 13 07/15/2021 0833   ALT 13 07/15/2021 0833   ALKPHOS 82 07/15/2021 0833   BILITOT 0.8 07/15/2021 0833   GFRNONAA >60 06/28/2019 1040   GFRAA >60 06/28/2019 1040   Lab Results  Component Value Date   CHOL 152 07/15/2021   HDL 61.00 07/15/2021   LDLCALC 77 07/15/2021   LDLDIRECT 78.0 07/15/2021   TRIG 67.0 07/15/2021   CHOLHDL 2 07/15/2021   Lab Results  Component Value Date   HGBA1C 7.7 (H) 07/15/2021   Lab Results  Component Value Date   VITAMINB12 >1526 (H) 09/22/2020   Lab Results  Component Value Date   TSH 1.02 07/08/2019       ASSESSMENT AND PLAN  Gait disturbance  Lumbosacral radiculopathy at S1  Numbness  Sciatica of left side  Transient alteration of awareness - Plan: EEG adult  1.  Etiology of his subjective gait disturbance is unclear.  Objectively, his gait was fairly normal.   NCV/EMG showed no neuropathy.     MRI of the cervical spine did not show any myelopathy.   2.he did not get benefit from gabapentin or lamotrigine for his pain.  He cannot take anti-inflammatories.  I recommended that he take Tylenol 650 mg 3 times a day 3.  If back pain/sciatica worsens he can call orthopedics for  an epidural as he has had in the past  4.   I will check an of the EEG as he continues to have the occasional spells where he notes altered awareness without loss of consciousness and without seizure activity.  EEG has been normal in the past.   5.   Rtc prn new or worsening symptoms.    Meridee Branum A. Felecia Shelling, MD, [facility] 29/12/6211, 08:65 AM Certified in Neurology, Clinical Neurophysiology, Sleep Medicine, Pain Medicine and Neuroimaging  [facility] [address] [place], [place] [address] [phone]

## 2022-06-01 ENCOUNTER — Encounter (HOSPITAL_COMMUNITY): Payer: Self-pay | Admitting: Psychiatry

## 2022-06-01 ENCOUNTER — Telehealth (HOSPITAL_BASED_OUTPATIENT_CLINIC_OR_DEPARTMENT_OTHER): Payer: Medicare Other | Admitting: Psychiatry

## 2022-06-01 DIAGNOSIS — F251 Schizoaffective disorder, depressive type: Secondary | ICD-10-CM

## 2022-06-01 DIAGNOSIS — R404 Transient alteration of awareness: Secondary | ICD-10-CM | POA: Diagnosis not present

## 2022-06-01 MED ORDER — FLUPHENAZINE HCL 5 MG PO TABS: ORAL_TABLET | ORAL | 1 refills | Status: DC

## 2022-06-01 MED ORDER — CLONAZEPAM 1 MG PO TABS: 1.0000 mg | ORAL_TABLET | Freq: Three times a day (TID) | ORAL | 1 refills | Status: DC | PRN

## 2022-06-01 MED ORDER — SERTRALINE HCL 100 MG PO TABS: 100.0000 mg | ORAL_TABLET | Freq: Every day | ORAL | 1 refills | Status: DC

## 2022-06-01 MED ORDER — AMANTADINE HCL 100 MG PO CAPS: 100.0000 mg | ORAL_CAPSULE | Freq: Two times a day (BID) | ORAL | 1 refills | Status: DC

## 2022-06-01 MED ORDER — VRAYLAR 6 MG PO CAPS: ORAL_CAPSULE | ORAL | 1 refills | Status: DC
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
## 2022-06-02 ENCOUNTER — Other Ambulatory Visit: Payer: Self-pay | Admitting: Family Medicine

## 2022-06-07 ENCOUNTER — Ambulatory Visit (INDEPENDENT_AMBULATORY_CARE_PROVIDER_SITE_OTHER): Payer: Medicare Other | Admitting: Neurology

## 2022-06-07 DIAGNOSIS — R4182 Altered mental status, unspecified: Secondary | ICD-10-CM

## 2022-06-07 DIAGNOSIS — R404 Transient alteration of awareness: Secondary | ICD-10-CM

## 2022-06-07 NOTE — Procedures (Signed)
    History:  56 year old man with transient alteration of awareness  EEG classification:  Awake and asleep  Description of the recording: The background rhythms of this recording consists of a fairly well modulated medium amplitude background activity of 10 Hz. As the record progresses, the patient initially is in the waking state, but appears to enter the early stage II sleep during the recording, with rudimentary sleep spindles and vertex sharp wave activity seen. During the wakeful state, photic stimulation is performed, and no abnormal responses were seen. Hyperventilation was also performed, no abnormal response seen. No epileptiform discharges seen during this recording. There was no focal slowing. EKG monitor shows no evidence of cardiac rhythm abnormalities with a heart rate of 84.  Abnormality: None   Impression: This is a normal EEG recording in the waking and sleeping state. No evidence interictal epileptiform discharges were seen at any time during the recording.  A normal EEG does not exclude a diagnosis of epilepsy.    Alric Ran, MD [facility]

## 2022-06-08 ENCOUNTER — Telehealth: Payer: Self-pay | Admitting: *Deleted

## 2022-06-08 NOTE — Telephone Encounter (Signed)
Called pt. Relayed results of EEG. He will f/u prn and call back if he has any new or worsening symptoms.

## 2022-06-25 ENCOUNTER — Encounter (HOSPITAL_COMMUNITY): Payer: Self-pay

## 2022-06-25 ENCOUNTER — Emergency Department (HOSPITAL_COMMUNITY)
Admission: EM | Admit: 2022-06-25 | Discharge: 2022-06-25 | Disposition: A | Discharge status: Home / Self Care | Payer: Medicare Other | Attending: Emergency Medicine | Admitting: Emergency Medicine

## 2022-06-25 ENCOUNTER — Other Ambulatory Visit: Payer: Self-pay

## 2022-06-25 ENCOUNTER — Emergency Department (HOSPITAL_COMMUNITY): Payer: Medicare Other

## 2022-06-25 DIAGNOSIS — S90931A Unspecified superficial injury of right great toe, initial encounter: Secondary | ICD-10-CM | POA: Diagnosis present

## 2022-06-25 DIAGNOSIS — S92424A Nondisplaced fracture of distal phalanx of right great toe, initial encounter for closed fracture: Secondary | ICD-10-CM | POA: Diagnosis not present

## 2022-06-25 DIAGNOSIS — Z7984 Long term (current) use of oral hypoglycemic drugs: Secondary | ICD-10-CM | POA: Diagnosis not present

## 2022-06-25 DIAGNOSIS — X58XXXA Exposure to other specified factors, initial encounter: Secondary | ICD-10-CM | POA: Diagnosis not present

## 2022-06-25 DIAGNOSIS — E119 Type 2 diabetes mellitus without complications: Secondary | ICD-10-CM | POA: Insufficient documentation

## 2022-06-25 DIAGNOSIS — M79671 Pain in right foot: Secondary | ICD-10-CM | POA: Diagnosis not present

## 2022-06-25 NOTE — ED Provider Notes (Signed)
[place] DEPT Provider Note   CSN: [id number] Arrival date & time: 06/25/22  1416     History  Chief Complaint  Patient presents with   Toe Pain    Glenn Robertson is a 56 y.o. male with a past medical history of diabetes who presents to the emergency department with concerns for right great toe pain onset prior to arrival.  Notes that his right great toe bent back and he felt a pop to the area and the area began to bleed.  No that he is having painful ambulation secondary to pain.  No meds tried prior to arrival.  Denies foot pain, ankle pain.  The history is provided by the patient. No language interpreter was used.       Home Medications Prior to Admission medications   Medication Sig Start Date End Date Taking? Authorizing Provider  ACCU-CHEK SOFTCLIX LANCETS lancets 3 (three) times daily. for testing 10/11/17   [provider]  albuterol (VENTOLIN HFA) 108 (90 Base) MCG/ACT inhaler Inhale 1-2 puffs into the lungs every 6 (six) hours as needed for wheezing or shortness of breath. 01/26/21   Libby Maw, MD  amantadine (SYMMETREL) 100 MG capsule Take 1 capsule (100 mg total) by mouth 2 (two) times daily. 06/01/22   Cobos, Myer Peer, MD  Cariprazine HCl (VRAYLAR) 6 MG CAPS TAKE 1 CAPSULE(6 MG) BY MOUTH DAILY 06/01/22   Cobos, Myer Peer, MD  clonazePAM (KLONOPIN) 1 MG tablet Take 1 tablet (1 mg total) by mouth 3 (three) times daily as needed for anxiety. for anxiety 06/01/22 08/30/22  Cobos, Myer Peer, MD  fluPHENAZine (PROLIXIN) 5 MG tablet Take one tablet ( 5 mgr)  at bedtime 06/01/22   Cobos, Myer Peer, MD  fluticasone [facility]) 50 MCG/ACT nasal spray SHAKE LIQUID AND USE 2 SPRAYS IN [facility] NOSTRIL DAILY 07/01/21   Libby Maw, MD  glipiZIDE (GLUCOTROL) 10 MG tablet TAKE 1 TABLET(10 MG) BY MOUTH TWICE DAILY 05/23/22   Libby Maw, MD  glucose blood (ACCU-CHEK GUIDE) test strip USE TO TEST 3 TIMES DAILY 04/04/22    Libby Maw, MD  JARDIANCE 25 MG TABS tablet TAKE 1 TABLET BY MOUTH DAILY 05/03/22   Libby Maw, MD  LUMIGAN 0.01 % SOLN INSTILL 1 DROP INTO AFFECTED EYE ONCE AT BEDTIME 10/28/20   [provider]  metFORMIN (GLUCOPHAGE-XR) 500 MG 24 hr tablet TAKE 2 TABLETS(1000 MG) BY MOUTH TWICE DAILY 04/07/22   Libby Maw, MD  montelukast (SINGULAIR) 10 MG tablet TAKE 1 TABLET(10 MG) BY MOUTH AT BEDTIME 07/08/21   Libby Maw, MD  RESTASIS 0.05 % ophthalmic emulsion  03/30/20   [provider]  sertraline (ZOLOFT) 100 MG tablet Take 1 tablet (100 mg total) by mouth daily. 06/01/22 11/28/22  Cobos, Myer Peer, MD  simvastatin (ZOCOR) 20 MG tablet TAKE 1 TABLET(20 MG) BY MOUTH EVERY EVENING 06/16/20   Libby Maw, MD  tadalafil (CIALIS) 20 MG tablet TAKE ONE-HALF TO ONE TABLET BY MOUTH EVERY OTHER DAY AS NEEDED FOR ERECTILE DYSFUNCTION 06/02/22   Libby Maw, MD  triamcinolone (KENALOG) 0.1 % Apply 1 application topically 2 (two) times daily. 11/09/20   Haydee Salter, MD  TYRVAYA 0.03 MG/ACT SOLN Spray 1 spray in each nostril twice daily, approximately 12 hours apart 10/22/20   [provider]  pregabalin (LYRICA) 100 MG capsule Take 100 mg by mouth daily.  11/14/11  [provider]  Allergies    Citric acid, Food, and Gabapentin    Review of Systems   Review of Systems  All other systems reviewed and are negative.   Physical Exam Updated Vital Signs BP 114/87 (BP Location: Right Arm)   Pulse 86   Temp 98.5 F (36.9 C)   Resp 18   SpO2 97%  Physical Exam Vitals and nursing note reviewed.  Constitutional:      General: He is not in acute distress.    Appearance: Normal appearance.  Eyes:     General: No scleral icterus.    Extraocular Movements: Extraocular movements intact.  Cardiovascular:     Rate and Rhythm: Normal rate.  Pulmonary:     Effort: Pulmonary effort is normal. No respiratory  distress.  Abdominal:     Palpations: Abdomen is soft. There is no mass.     Tenderness: There is no abdominal tenderness.  Musculoskeletal:        General: Normal range of motion.     Cervical back: Neck supple.     Comments: No tenderness to palpation noted to right great foot.  Bleeding able to be expressed from the nailbed to the right great toe. No obvious wound noted to the area. Pedal pulses intact.  Normal flexion extension of right great toe without difficulty.  Nail intact.  Skin:    General: Skin is warm and dry.     Findings: No rash.  Neurological:     Mental Status: He is alert.     Sensory: Sensation is intact.     Motor: Motor function is intact.  Psychiatric:        Behavior: Behavior normal.     ED Results / Procedures / Treatments   Labs (all labs ordered are listed, but only abnormal results are displayed) Labs Reviewed - No data to display  EKG None  Radiology DG Foot Complete Right  Result Date: 06/25/2022 CLINICAL DATA:  Pain EXAM: RIGHT FOOT COMPLETE - 3+ VIEW COMPARISON:  01/07/2020 FINDINGS: There is linear lucency in the base of distal phalanx of right big toe suggesting undisplaced fracture. Minimal bony spurs seen in first metatarsophalangeal joint. IMPRESSION: There is new radiolucent line in the base of distal phalanx of right big toe suggesting undisplaced fracture. Electronically Signed   By: Elmer Picker M.D.   On: 06/25/2022 15:12    Procedures Procedures    Medications Ordered in ED Medications - No data to display  ED Course/ Medical Decision Making/ A&P Clinical Course as of 06/25/22 2143  Sat Jun 25, 2022  1510 Discussed with patient discharge treatment plan. Answered all available questions. Pt appears safe for discharge at this time.  [SB]    Clinical Course User Index [SB] Lindley Hiney A, PA-C                           Medical Decision Making Amount and/or Complexity of Data Reviewed Radiology: ordered.   Pt  presents with right toe pain onset PTA. History of DM. Vital signs, pt afebrile. On exam, pt with no tenderness to palpation noted to right great foot.  Bleeding able to be expressed from the nailbed to the right great toe. No obvious wound noted to the area. Pedal pulses intact.  Normal flexion extension of right great toe without difficulty.  Nail intact. No acute cardiovascular, respiratory, abdominal exam findings. Differential diagnosis includes fracture, dislocation, paronychia.    Co morbidities that complicate the  patient evaluation: DM  Imaging: I ordered imaging studies including  Right foot X-ray I independently visualized and interpreted imaging which showed: There is new radiolucent line in the base of distal phalanx of right big toe suggesting undisplaced fracture. I agree with the radiologist interpretation  Disposition: Presentation suspicious for fracture, doubt dislocation at this time. Doubt concerns for paronychia or cellulitis at this time.  After consideration of the diagnostic results and the patients response to treatment, I feel that the patient would benefit from Discharge home.  Patient provided with information for orthopedist today.  Patient provided with postop shoe today.  Supportive care measures and strict return precautions discussed with patient at bedside. Pt acknowledges and verbalizes understanding. Pt appears safe for discharge. Follow up as indicated in discharge paperwork.    This chart was dictated using voice recognition software, Dragon. Despite the best efforts of this provider to proofread and correct errors, errors may still occur which can change documentation meaning.   Final Clinical Impression(s) / ED Diagnoses Final diagnoses:  Closed nondisplaced fracture of distal phalanx of right great toe, initial encounter    Rx / DC Orders ED Discharge Orders     None         Shahmeer Bunn A, PA-C 06/25/22 2144    Regan Lemming,  MD 06/25/22 2254

## 2022-06-25 NOTE — Discharge Instructions (Addendum)
It was a pleasure taking care of you today!  Your x-ray showed concern for fracture (break in bone) to your right big toe. Attached is information for the on-call Orthopedist, call today and set up a follow-up appointment regarding today's ED visit.  You will be given a postop shoe today in the emergency department, wear during the day to aid with ambulation, you may remove it at night.  You may take over-the-counter 500 mg Tylenol every 6 hours and alternate with 600 mg ibuprofen every 6 hours for no more than 7 days.  Place ice to the affected area up to 15 minutes at a time, initially place a barrier between your skin and the ice.  Return to the emergency department if you experience increasing/worsening symptoms.

## 2022-06-25 NOTE — ED Triage Notes (Signed)
Patient said his right toe bent back, he heard something pop, and it began to bleed. History of type 2 diabetes.

## 2022-06-27 ENCOUNTER — Ambulatory Visit (INDEPENDENT_AMBULATORY_CARE_PROVIDER_SITE_OTHER): Payer: Medicare Other | Admitting: Licensed Clinical Social Worker

## 2022-06-27 DIAGNOSIS — F251 Schizoaffective disorder, depressive type: Secondary | ICD-10-CM | POA: Diagnosis not present

## 2022-06-27 NOTE — Progress Notes (Signed)
Virtual Visit via Video Note  I connected with [name] on 06/27/22 at  2:00 PM EDT by a video enabled telemedicine application and verified that I am speaking with the correct person using two identifiers.  Location: Patient: home Provider: remote office [place], [place])   I discussed the limitations of evaluation and management by telemedicine and the availability of in person appointments. The patient expressed understanding and agreed to proceed.   I discussed the assessment and treatment plan with the patient. The patient was provided an opportunity to ask questions and all were answered. The patient agreed with the plan and demonstrated an understanding of the instructions.   The patient was advised to call back or seek an in-person evaluation if the symptoms worsen or if the condition fails to improve as anticipated.  I provided 55 minutes of non-face-to-face time during this encounter.   Kendry Pfarr R Halo Laski, LCSW   THERAPIST PROGRESS NOTE  Session Time: 2-3p  Participation Level: Active  Behavioral Response: Neat and Well GroomedAlertDepressed  Type of Therapy: Individual Therapy  Treatment Goals addressed: Problem: Mood disorder Goal:  Reduce overall frequency, intensity, and duration of the anxiety so that daily functioning is not impaired per pt self report 3 out of 5 sessions documented.   Outcome: Progressing Goal: Develop healthy thinking patterns and beliefs about self, others, and the world that lead to the alleviation and help prevent the relapse of depression per pt report 3 out of 5 sessions documented Outcome: Progressing Intervention: REVIEW PLEASE SKILLS (TREAT PHYSICAL ILLNESS, BALANCE EATING, AVOID MOOD-ALTERING SUBSTANCES, BALANCE SLEEP AND GET EXERCISE) WITH Thanh Note: Reviewed  Intervention: Encourage patient to identify triggers Note: Assisted pt w/ identifying recent emotional triggers and coping skills to manage  ProgressTowards Goals:  Progressing  Interventions: CBT--identifying/challenging thought distortions  Summary: HAMZEH POPWELL is a 56 y.o. male who presents with continuing symptoms related to schizoaffective disorder. Pt reports that he is compliant with his medication and is getting fair quality and quantity of sleep currently (pt recently injured his toe and the pain is impacting sleep quality).   Allowed pt to explore and express thoughts and feelings associated with recent life situations and external stressors.Patient reports that he recently injured his toe, and the pain has been impacting his everyday functioning. Patient reports before the injury he was feeling OK, mood wise. Discussed recent external stressors--patient reports that sometimes he will continue to get intrusive thoughts about his neighbor "that didn't like me" popping into his head. Patient reports that the neighbor no longer lives there, and patient has not interacted with this neighbor in quite some time. Patient reports that he is very self aware, and recognizes that when the thoughts pop into his head that they are irrational thoughts. Patient reports that typically he has the ability to dismissed the thoughts without any additional emotion being triggered by them--but sometimes patient will find himself getting angry when the thoughts pop into his head. Reviewed ways of managing emotion in the moment using deep breathing, and positive behavior distractions. Patient reflects understanding.  Patient reports that he continues to have very good relationships with family members, and they engage in outings and family gatherings on a regular basis. Patient reports that he continues to worry about the health of his father and has a very positive relationship with him. Patient reports he also has a very good relationship with his brother and his mother, and that they are very good support system for him.  Discussed overall  health related behaviors--patient  reports that he tries to be mindful of what he eats, and has lost some weight. Patient reports that he used to eat pizza and fries everyday, but now he is trying to make healthier choices.  Patient reports that he does have continuing paranoia--patient reports that he often will see people in cars that are following him when he's driving "they only follow me when I'm driving". Patient reports that he has a good level of self-awareness about this, and often can recognize that it it is just his paranoia. However after making this statement patient also stated that he knows that people are really following him and it's everyone else that doesn't believe him.   Patient reports that he is continuing in his Spanish class, and is worried about missing classes because of his injury. Encouraged patient to reach out to professor and school personnel to make them aware of his situation. Patient reflects understanding.  Continued recommendations are as follows: self care behaviors, positive social engagements, focusing on overall work/home/life balance, and focusing on positive physical and emotional wellness.   Suicidal/Homicidal: No  Therapist Response: Pt is continuing to apply interventions learned in session into daily life situations. Pt is currently on track to meet goals utilizing interventions mentioned above. Personal growth and progress noted. Treatment to continue as indicated.   Plan: Return again in 4 weeks.  Diagnosis:  Encounter Diagnosis  Name Primary?   Schizoaffective disorder, depressive type (HCC) Yes   Collaboration of Care: Other Pt encouraged to continue care with psychiatrist of record, Dr. Madaline Guthrie Cobos.   Patient/Guardian was advised Release of Information must be obtained prior to any record release in order to collaborate their care with an outside provider. Patient/Guardian was advised if they have not already done so to contact the registration department to sign all  necessary forms in order for [place] to release information regarding their care.   Consent: Patient/Guardian gives verbal consent for treatment and assignment of benefits for services provided during this visit. Patient/Guardian expressed understanding and agreed to proceed.   Ernest Haber Sula Fetterly, LCSW 06/27/2022

## 2022-06-28 ENCOUNTER — Ambulatory Visit (INDEPENDENT_AMBULATORY_CARE_PROVIDER_SITE_OTHER): Payer: Medicare Other | Admitting: Orthopaedic Surgery

## 2022-06-28 ENCOUNTER — Encounter (HOSPITAL_COMMUNITY): Payer: Self-pay | Admitting: Psychiatry

## 2022-06-28 ENCOUNTER — Ambulatory Visit (HOSPITAL_BASED_OUTPATIENT_CLINIC_OR_DEPARTMENT_OTHER): Payer: Medicare Other | Admitting: Psychiatry

## 2022-06-28 ENCOUNTER — Encounter: Payer: Self-pay | Admitting: Orthopaedic Surgery

## 2022-06-28 DIAGNOSIS — F251 Schizoaffective disorder, depressive type: Secondary | ICD-10-CM

## 2022-06-28 DIAGNOSIS — S92424A Nondisplaced fracture of distal phalanx of right great toe, initial encounter for closed fracture: Secondary | ICD-10-CM

## 2022-06-28 DIAGNOSIS — R404 Transient alteration of awareness: Secondary | ICD-10-CM | POA: Diagnosis not present

## 2022-06-28 MED ORDER — SERTRALINE HCL 100 MG PO TABS: 100.0000 mg | ORAL_TABLET | Freq: Every day | ORAL | 1 refills | Status: DC

## 2022-06-28 MED ORDER — VRAYLAR 6 MG PO CAPS: ORAL_CAPSULE | ORAL | 1 refills | Status: DC

## 2022-06-28 MED ORDER — FLUPHENAZINE HCL 5 MG PO TABS: ORAL_TABLET | ORAL | 1 refills | Status: DC

## 2022-06-28 MED ORDER — AMANTADINE HCL 100 MG PO CAPS: 100.0000 mg | ORAL_CAPSULE | Freq: Two times a day (BID) | ORAL | 1 refills | Status: DC

## 2022-06-28 MED ORDER — CLONAZEPAM 1 MG PO TABS: 1.0000 mg | ORAL_TABLET | Freq: Three times a day (TID) | ORAL | 1 refills | Status: DC | PRN

## 2022-06-28 NOTE — Plan of Care (Signed)
  Problem: Mood disorder Goal:  Reduce overall frequency, intensity, and duration of the anxiety so that daily functioning is not impaired per pt self report 3 out of 5 sessions documented.   Outcome: Progressing Goal: Develop healthy thinking patterns and beliefs about self, others, and the world that lead to the alleviation and help prevent the relapse of depression per pt report 3 out of 5 sessions documented Outcome: Progressing Intervention: REVIEW PLEASE SKILLS (TREAT PHYSICAL ILLNESS, BALANCE EATING, AVOID MOOD-ALTERING SUBSTANCES, BALANCE SLEEP AND GET EXERCISE) WITH [place] Note: Reviewed  Intervention: Encourage patient to identify triggers Note: Assisted pt w/ identifying recent emotional triggers and coping skills to manage
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
## 2022-06-28 NOTE — Progress Notes (Signed)
Office Visit Note   Patient: Glenn Robertson           Date of Birth: 08-Oct-1965           MRN: [id number] Visit Date: 06/28/2022              Requested by: Libby Maw, MD [address] [place],  [place] [address] PCP: Libby Maw, MD   Assessment & Plan: Visit Diagnoses:  1. Nondisplaced fracture of distal phalanx of right great toe, initial encounter for closed fracture     Plan: Continue current shoe wear.  Good work-up done no weights.  We reviewed x-rays he can continue the meloxicam as needed.  Recheck 6 weeks and he would like to have x-rays of his toe on return.  Follow-Up Instructions: Return in about 6 weeks (around 08/09/2022).   Orders:  No orders of the defined types were placed in this encounter.  No orders of the defined types were placed in this encounter.     Procedures: No procedures performed   Clinical Data: No additional findings.   Subjective: Chief Complaint  Patient presents with   Right Great Toe - Fracture    HPI patient injured his foot when he is trying to get out of his pants toe bent back heard a pop and acute pain when his toe hit the floor.  He had bleeding from the nail and x-ray demonstrated nondisplaced fracture of the distal phalanx proximal metaphyseal region short oblique nondisplaced on all 3 views.  Bleeding is stopped he is use meloxicam with some relief.  He is given a postop shoe but states he would rather wear the tennis shoes he is wearing at work better.  He was taping his toe for period time.  Review of Systems positive history of paranoid schizophrenia chronic type 2 diabetes.  Lumbar disc degeneration problems controlled.     Objective: Vital Signs: BP 115/83   Pulse 74   Ht 6' (1.829 m)   Wt 200 lb (90.7 kg)   BMI 27.12 kg/m   Physical Exam Constitutional:      Appearance: He is well-developed.  HENT:     Head: Normocephalic and atraumatic.     Right Ear: External ear normal.      Left Ear: External ear normal.  Eyes:     Pupils: Pupils are equal, round, and reactive to light.  Neck:     Thyroid: No thyromegaly.     Trachea: No tracheal deviation.  Cardiovascular:     Rate and Rhythm: Normal rate.  Pulmonary:     Effort: Pulmonary effort is normal.     Breath sounds: No wheezing.  Abdominal:     General: Bowel sounds are normal.     Palpations: Abdomen is soft.  Musculoskeletal:     Cervical back: Neck supple.  Skin:    General: Skin is warm and dry.     Capillary Refill: Capillary refill takes less than 2 seconds.  Neurological:     Mental Status: He is alert and oriented to person, place, and time.  Psychiatric:        Behavior: Behavior normal.        Thought Content: Thought content normal.        Judgment: Judgment normal.     Ortho Exam no bleeding from the right great toe no subcu ungual hematoma tenderness of the distal phalanx.  No angulation.  Specialty Comments:  No specialty comments available.  Imaging:  CLINICAL DATA:  Pain   EXAM: RIGHT FOOT COMPLETE - 3+ VIEW   COMPARISON:  01/07/2020   FINDINGS: There is linear lucency in the base of distal phalanx of right big toe suggesting undisplaced fracture. Minimal bony spurs seen in first metatarsophalangeal joint.   IMPRESSION: There is new radiolucent line in the base of distal phalanx of right big toe suggesting undisplaced fracture.     Electronically Signed   By: Elmer Picker M.D.   On: 06/25/2022 15:12     PMFS History: Patient Active Problem List   Diagnosis Date Noted   Nondisplaced fracture of distal phalanx of right great toe, initial encounter for closed fracture 06/28/2022   Inclusion cyst 10/07/2021   Gait disturbance 10/04/2021   Olecranon bursitis of left elbow 05/31/2021   Allergic rhinitis 01/18/2021   Skin lesion of left ear 01/18/2021   Abrasion of left pinna 12/25/2020   Abrasion of left knee 12/25/2020   Tremor due to multiple drugs  10/02/2020   Seborrheic dermatitis 10/02/2020   Polyneuropathy 05/12/2020   Elevated cholesterol 10/08/2019   Benign prostatic hyperplasia with nocturia 10/08/2019   Schizoaffective disorder, depressive type ([facility]) 11/27/2018   Urinary hesitancy 10/08/2018   Erectile dysfunction 09/24/2018   Numbness 08/20/2018   Transient alteration of awareness 08/08/2018   Tendinopathy of right gluteus medius 05/22/2018   Pain of right hip joint 05/08/2018   Hearing difficulty 04/13/2018   Trochanteric bursitis, right hip 03/23/2018   Reactive airway disease 03/16/2018   Headache 03/02/2018   Type 2 diabetes mellitus without complication, without long-term current use of insulin ([facility]) 02/16/2018   Vitamin B 12 deficiency 02/16/2018   Healthcare maintenance 02/16/2018   Pain in left leg 10/28/2016   Lumbosacral radiculopathy at S1 03/15/2016   Lumbar degenerative disc disease 12/18/2015   Disc displacement, lumbar 04/21/2015   Chondromalacia of both patellae 02/24/2015   Paranoid schizophrenia, chronic condition ([facility]) 02/24/2015   Right anterior knee pain 07/18/2014   Right ankle pain 01/27/2014   Pain in joint, ankle and foot 01/27/2014   Sciatica 01/10/2012   Disturbance of skin sensation 11/14/2011   Past Medical History:  Diagnosis Date   Allergy    dog and cats, citrus   Anxiety    Asthma    Chronic pain    Depression    Diabetes mellitus    Glaucoma    Neuromuscular disorder ([facility])    Paranoid schizophrenia ([facility])     Family History  Problem Relation Age of Onset   Diabetes Father    Diabetes Paternal Uncle    Colon cancer Neg Hx    Colon polyps Neg Hx    Esophageal cancer Neg Hx    Rectal cancer Neg Hx    Stomach cancer Neg Hx     Past Surgical History:  Procedure Laterality Date   ANKLE ARTHROSCOPY     Arm Surgery  1/12   rt ulnar nerve decompression   EPIDURAL BLOCK INJECTION     several   ULNAR NERVE TRANSPOSITION  01/19/2012   Procedure: ULNAR NERVE  DECOMPRESSION/TRANSPOSITION;  Surgeon: Cammie Sickle., MD;  Location: [facility];  Service: Orthopedics;  Laterality: Left;  ulnar nerve decompression vs transposition left elbow   Social History   Occupational History   Occupation: Unemlpoyed-disabled  Tobacco Use   Smoking status: Never   Smokeless tobacco: Never  Vaping Use   Vaping Use: Never used  Substance and Sexual Activity   Alcohol use: No  Drug use: No   Sexual activity: Yes

## 2022-07-02 ENCOUNTER — Other Ambulatory Visit: Payer: Self-pay | Admitting: Family Medicine

## 2022-07-02 DIAGNOSIS — E119 Type 2 diabetes mellitus without complications: Secondary | ICD-10-CM

## 2022-07-05 ENCOUNTER — Other Ambulatory Visit (HOSPITAL_COMMUNITY): Payer: Self-pay

## 2022-07-05 ENCOUNTER — Ambulatory Visit (INDEPENDENT_AMBULATORY_CARE_PROVIDER_SITE_OTHER): Payer: Medicare Other | Admitting: Family Medicine

## 2022-07-05 ENCOUNTER — Encounter: Payer: Self-pay | Admitting: Family Medicine

## 2022-07-05 ENCOUNTER — Telehealth: Payer: Self-pay

## 2022-07-05 VITALS — BP 116/80 | HR 87 | Temp 97.3°F | Ht 72.0 in | Wt 201.4 lb

## 2022-07-05 DIAGNOSIS — L309 Dermatitis, unspecified: Secondary | ICD-10-CM | POA: Diagnosis not present

## 2022-07-05 DIAGNOSIS — L304 Erythema intertrigo: Secondary | ICD-10-CM | POA: Diagnosis not present

## 2022-07-05 DIAGNOSIS — E7800 Pure hypercholesterolemia, unspecified: Secondary | ICD-10-CM

## 2022-07-05 DIAGNOSIS — B353 Tinea pedis: Secondary | ICD-10-CM | POA: Diagnosis not present

## 2022-07-05 MED ORDER — TRIAMCINOLONE ACETONIDE 0.1 % EX OINT: TOPICAL_OINTMENT | CUTANEOUS | 0 refills | Status: DC

## 2022-07-05 MED ORDER — ITRACONAZOLE 100 MG PO CAPS: 100.0000 mg | ORAL_CAPSULE | Freq: Every day | ORAL | 0 refills | Status: DC

## 2022-07-05 NOTE — Progress Notes (Signed)
Established Patient Office Visit  Subjective   Patient ID: Glenn Robertson, male    DOB: 27-Jan-1966  Age: 56 y.o. MRN: [id number]  Chief Complaint  Patient presents with   Rash    Pt c/o of rash on rt foot and ankle under arm pits and both elbows x 1 month.    Rash   for evaluation of rashes on his feet, posterior elbows and axilla.  Rash in axilla is occasionally pruritic.  He stopped using his current deodorant 2 weeks ago.  It is somewhat improved.  He started using another deodorant.  Rash on his posterior elbows.  He has no history of psoriasis.  Rash is nonpruritic.  Rash on the sides of his feet.  He has not been taking his simvastatin.    Review of Systems  Constitutional: Negative.   HENT: Negative.    Eyes:  Negative for blurred vision, discharge and redness.  Respiratory: Negative.    Cardiovascular: Negative.   Gastrointestinal:  Negative for abdominal pain.  Genitourinary: Negative.   Musculoskeletal: Negative.  Negative for myalgias.  Skin:  Positive for itching and rash.  Neurological:  Negative for tingling, loss of consciousness and weakness.  Endo/Heme/Allergies:  Negative for polydipsia.      Objective:     BP 116/80 (BP Location: Right Arm, Patient Position: Sitting, Cuff Size: Normal)   Pulse 87   Temp (!) 97.3 F (36.3 C) (Temporal)   Ht 6' (1.829 m)   Wt 201 lb 6.4 oz (91.4 kg)   SpO2 97%   BMI 27.31 kg/m    Physical Exam Constitutional:      General: He is not in acute distress.    Appearance: Normal appearance. He is not ill-appearing, toxic-appearing or diaphoretic.  HENT:     Head: Normocephalic and atraumatic.     Right Ear: External ear normal.     Left Ear: External ear normal.  Eyes:     General: No scleral icterus.       Right eye: No discharge.        Left eye: No discharge.     Extraocular Movements: Extraocular movements intact.     Conjunctiva/sclera: Conjunctivae normal.  Pulmonary:     Effort: Pulmonary effort is  normal. No respiratory distress.  Skin:    General: Skin is warm and dry.       Neurological:     Mental Status: He is alert and oriented to person, place, and time.  Psychiatric:        Mood and Affect: Mood normal.        Behavior: Behavior normal.      No results found for any visits on 07/05/22.    The 10-year ASCVD risk score (Arnett DK, et al., 2019) is: 9.1%    Assessment & Plan:   Problem List Items Addressed This Visit       Musculoskeletal and Integument   Dermatitis   Relevant Medications   triamcinolone ointment (KENALOG) 0.1 %   Intertrigo   Relevant Medications   itraconazole (SPORANOX) 100 MG capsule   Tinea pedis of both feet - Primary   Relevant Medications   itraconazole (SPORANOX) 100 MG capsule     Other   Elevated cholesterol    Return Return fasting for physical exam next month..   We will use triamcinolone on patches on elbow.  Sporanox 100 mg daily for 2 weeks for tinea pedis.  He will stop underarm deodorants entirely.  Explained the  importance of taking a statin with his history of diabetes.  He will follow-up fasting next month or a physical exam. Libby Maw, MD

## 2022-07-05 NOTE — Telephone Encounter (Signed)
Pharmacy Patient Advocate Encounter   Received notification from [facility] that prior authorization for Itraconazole '100MG'$  capsules is required/requested.   PA submitted on 07/05/2022 to [facility] via CoverMyMeds Key [id number]  Status is pending

## 2022-07-06 ENCOUNTER — Other Ambulatory Visit (HOSPITAL_COMMUNITY): Payer: Self-pay

## 2022-07-06 NOTE — Telephone Encounter (Signed)
Called pharmacy to inform of approval, patient aware and will pick up Rx.

## 2022-07-06 NOTE — Telephone Encounter (Signed)
Patient Advocate Encounter  Prior Authorization for  Itraconazole '100MG'$  capsules  has been approved.    PA# [id number] Key [id number]   Effective dates: 07/05/2022 through 01/02/2023  Approval letter attached to charts

## 2022-07-18 ENCOUNTER — Telehealth: Payer: Self-pay | Admitting: Family Medicine

## 2022-07-18 ENCOUNTER — Ambulatory Visit: Payer: Medicare Other | Admitting: Family Medicine

## 2022-07-18 ENCOUNTER — Other Ambulatory Visit: Payer: Self-pay

## 2022-07-18 DIAGNOSIS — E119 Type 2 diabetes mellitus without complications: Secondary | ICD-10-CM

## 2022-07-18 MED ORDER — ACCU-CHEK GUIDE VI STRP: ORAL_STRIP | 3 refills | Status: DC

## 2022-07-18 NOTE — Telephone Encounter (Signed)
11.20.23 no show letter sent

## 2022-07-19 ENCOUNTER — Ambulatory Visit (INDEPENDENT_AMBULATORY_CARE_PROVIDER_SITE_OTHER): Payer: Medicare Other | Admitting: Family Medicine

## 2022-07-19 ENCOUNTER — Encounter: Payer: Self-pay | Admitting: Family Medicine

## 2022-07-19 VITALS — BP 124/82 | HR 85 | Temp 97.3°F | Ht 72.0 in | Wt 201.0 lb

## 2022-07-19 DIAGNOSIS — L081 Erythrasma: Secondary | ICD-10-CM | POA: Diagnosis not present

## 2022-07-19 DIAGNOSIS — E7800 Pure hypercholesterolemia, unspecified: Secondary | ICD-10-CM | POA: Diagnosis not present

## 2022-07-19 DIAGNOSIS — L853 Xerosis cutis: Secondary | ICD-10-CM | POA: Diagnosis not present

## 2022-07-19 DIAGNOSIS — T22149A Burn of first degree of unspecified axilla, initial encounter: Secondary | ICD-10-CM | POA: Insufficient documentation

## 2022-07-19 MED ORDER — CLARITHROMYCIN ER 500 MG PO TB24: 1000.0000 mg | ORAL_TABLET | Freq: Every day | ORAL | 0 refills | Status: AC

## 2022-07-19 MED ORDER — SIMVASTATIN 20 MG PO TABS: ORAL_TABLET | ORAL | 3 refills | Status: DC

## 2022-07-19 NOTE — Progress Notes (Signed)
Established Patient Office Visit  Subjective   Patient ID: Glenn Robertson, male    DOB: 03-14-1966  Age: 56 y.o. MRN: [id number]  Chief Complaint  Patient presents with   Rash    Rash at armpits x few months check mark on right wrist.     Rash   follow-up of rash on his feet and a new rash in his axilla.  There is area of hyperpigmentation on his left posterior lateral forearm that has been stable over the last 6 years.  It is unchanged.  It does not bleed spontaneously.  Has had a rash in his axilla for few months.  He has tried changing underarm deodorants.  This did not help.  Recently on Sporanox continue pedis.  Tenia rash cleared up.  There is dryness on his lateral right ankle.  Simvastatin was discontinued and patient does not know why.  He is a diabetic and needs to be on a statin.  We discussed this.    Review of Systems  Constitutional: Negative.   HENT: Negative.    Eyes:  Negative for blurred vision, discharge and redness.  Respiratory: Negative.    Cardiovascular: Negative.   Gastrointestinal:  Negative for abdominal pain.  Genitourinary: Negative.   Musculoskeletal: Negative.  Negative for myalgias.  Skin:  Positive for itching and rash.  Neurological:  Negative for tingling, loss of consciousness and weakness.  Endo/Heme/Allergies:  Negative for polydipsia.      Objective:     BP 124/82 (BP Location: Right Arm, Patient Position: Sitting, Cuff Size: Normal)   Pulse 85   Temp (!) 97.3 F (36.3 C) (Temporal)   Ht 6' (1.829 m)   Wt 201 lb (91.2 kg)   SpO2 96%   BMI 27.26 kg/m    Physical Exam Constitutional:      General: He is not in acute distress.    Appearance: Normal appearance. He is not ill-appearing, toxic-appearing or diaphoretic.  HENT:     Head: Normocephalic and atraumatic.     Right Ear: External ear normal.     Left Ear: External ear normal.  Eyes:     General: No scleral icterus.       Right eye: No discharge.        Left eye: No  discharge.     Extraocular Movements: Extraocular movements intact.     Conjunctiva/sclera: Conjunctivae normal.  Cardiovascular:     Rate and Rhythm: Normal rate.  Pulmonary:     Effort: Pulmonary effort is normal. No respiratory distress.     Breath sounds: Normal breath sounds.  Skin:    General: Skin is warm and dry.       Neurological:     Mental Status: He is alert and oriented to person, place, and time.  Psychiatric:        Mood and Affect: Mood normal.        Behavior: Behavior normal.      No results found for any visits on 07/19/22.    The 10-year ASCVD risk score (Arnett DK, et al., 2019) is: 10.2%    Assessment & Plan:   Problem List Items Addressed This Visit       Musculoskeletal and Integument   Xerosis of skin   Erythrasma   Relevant Medications   clarithromycin (BIAXIN XL) 500 MG 24 hr tablet     Other   Elevated cholesterol - Primary   Relevant Medications   simvastatin (ZOCOR) 20 MG tablet  Return Return fasting for blood work next month and physical..  Biaxin for erythrasma.  Watchful waiting for pigmentation on right lateral posterior wrist.  Skin moisturizing cream for xerosis of right lateral ankle.  We will restart Zocor.  Discussed his need to be on a statin with his history of diabetes.  Libby Maw, MD

## 2022-07-25 ENCOUNTER — Other Ambulatory Visit (HOSPITAL_COMMUNITY): Payer: Self-pay

## 2022-07-26 ENCOUNTER — Encounter: Payer: Self-pay | Admitting: Family Medicine

## 2022-07-26 ENCOUNTER — Ambulatory Visit (HOSPITAL_COMMUNITY): Payer: Medicare Other | Admitting: Psychiatry

## 2022-07-26 ENCOUNTER — Ambulatory Visit (INDEPENDENT_AMBULATORY_CARE_PROVIDER_SITE_OTHER): Payer: Medicare Other | Admitting: Family Medicine

## 2022-07-26 VITALS — BP 118/74 | HR 85 | Temp 97.1°F | Resp 18 | Ht 71.5 in | Wt 202.4 lb

## 2022-07-26 DIAGNOSIS — E119 Type 2 diabetes mellitus without complications: Secondary | ICD-10-CM | POA: Diagnosis not present

## 2022-07-26 DIAGNOSIS — E7800 Pure hypercholesterolemia, unspecified: Secondary | ICD-10-CM

## 2022-07-26 DIAGNOSIS — E538 Deficiency of other specified B group vitamins: Secondary | ICD-10-CM | POA: Diagnosis not present

## 2022-07-26 DIAGNOSIS — Z125 Encounter for screening for malignant neoplasm of prostate: Secondary | ICD-10-CM | POA: Diagnosis not present

## 2022-07-26 DIAGNOSIS — Z0000 Encounter for general adult medical examination without abnormal findings: Secondary | ICD-10-CM

## 2022-07-26 LAB — PSA: PSA: 0.41 ng/mL (ref 0.10–4.00)

## 2022-07-26 LAB — CBC
HCT: 48.9 % (ref 39.0–52.0)
Hemoglobin: 16.2 g/dL (ref 13.0–17.0)
MCHC: 33 g/dL (ref 30.0–36.0)
MCV: 86.3 fl (ref 78.0–100.0)
Platelets: 213 10*3/uL (ref 150.0–400.0)
RBC: 5.67 Mil/uL (ref 4.22–5.81)
RDW: 13.1 % (ref 11.5–15.5)
WBC: 7.8 10*3/uL (ref 4.0–10.5)

## 2022-07-26 LAB — HEMOGLOBIN A1C: Hgb A1c MFr Bld: 8.8 % — ABNORMAL HIGH (ref 4.6–6.5)

## 2022-07-26 LAB — LIPID PANEL
Cholesterol: 131 mg/dL (ref 0–200)
HDL: 61 mg/dL (ref >39.00)
LDL Cholesterol: 58 mg/dL (ref 0–99)
NonHDL: 69.86
Total CHOL/HDL Ratio: 2
Triglycerides: 59 mg/dL (ref 0.0–149.0)
VLDL: 11.8 mg/dL (ref 0.0–40.0)

## 2022-07-26 LAB — URINALYSIS, ROUTINE W REFLEX MICROSCOPIC
Bilirubin Urine: NEGATIVE
Hgb urine dipstick: NEGATIVE
Ketones, ur: NEGATIVE
Leukocytes,Ua: NEGATIVE
Nitrite: NEGATIVE
RBC / HPF: NONE SEEN (ref 0–?)
Specific Gravity, Urine: <=1.005 — AB (ref 1.000–1.030)
Total Protein, Urine: NEGATIVE
Urine Glucose: >=1000 — AB
Urobilinogen, UA: 0.2 (ref 0.0–1.0)
WBC, UA: NONE SEEN (ref 0–?)
pH: 6.5 (ref 5.0–8.0)

## 2022-07-26 LAB — COMPREHENSIVE METABOLIC PANEL
ALT: 18 U/L (ref 0–53)
AST: 18 U/L (ref 0–37)
Albumin: 5 g/dL (ref 3.5–5.2)
Alkaline Phosphatase: 74 U/L (ref 39–117)
BUN: 11 mg/dL (ref 6–23)
CO2: 28 mEq/L (ref 19–32)
Calcium: 10 mg/dL (ref 8.4–10.5)
Chloride: 102 mEq/L (ref 96–112)
Creatinine, Ser: 1.05 mg/dL (ref 0.40–1.50)
GFR: 79.69 mL/min (ref >60.00)
Glucose, Bld: 109 mg/dL — ABNORMAL HIGH (ref 70–99)
Potassium: 4 mEq/L (ref 3.5–5.1)
Sodium: 140 mEq/L (ref 135–145)
Total Bilirubin: 0.9 mg/dL (ref 0.2–1.2)
Total Protein: 7.8 g/dL (ref 6.0–8.3)

## 2022-07-26 LAB — MICROALBUMIN / CREATININE URINE RATIO
Creatinine,U: 23.8 mg/dL
Microalb Creat Ratio: 2.9 mg/g (ref 0.0–30.0)
Microalb, Ur: <0.7 mg/dL (ref 0.0–1.9)

## 2022-07-26 LAB — VITAMIN B12: Vitamin B-12: 1367 pg/mL — ABNORMAL HIGH (ref 211–911)

## 2022-07-26 MED ORDER — METFORMIN HCL ER 500 MG PO TB24: ORAL_TABLET | ORAL | 3 refills | Status: DC

## 2022-07-26 MED ORDER — GLIPIZIDE 10 MG PO TABS: ORAL_TABLET | ORAL | 3 refills | Status: DC

## 2022-07-26 NOTE — Progress Notes (Signed)
Established Patient Office Visit  Subjective   Patient ID: Glenn Robertson, male    DOB: November 11, 1965  Age: 56 y.o. MRN: [id number]  Chief Complaint  Patient presents with   Annual Exam    CPE -- pt is fasting     HPI for CPE.  He is fasting.  Also for follow-up type 2 diabetes and elevated cholesterol.  Recently restarted Zocor 20 mg and is taking it in the late afternoon.  Continues with glipizide 10 mg twice daily and metformin 1000 mg twice daily.  Exercise has been difficult for him at this point secondary to current orthopedic issues.  He has follow-up with orthopedics.  He is seeing the dentist regularly.  He is up-to-date on health maintenance.    Review of Systems  Constitutional: Negative.   HENT: Negative.    Eyes:  Negative for blurred vision, discharge and redness.  Respiratory: Negative.    Cardiovascular: Negative.   Gastrointestinal:  Negative for abdominal pain.  Genitourinary: Negative.   Musculoskeletal:  Positive for joint pain. Negative for myalgias.  Skin:  Negative for rash.  Neurological:  Negative for tingling, loss of consciousness and weakness.  Endo/Heme/Allergies:  Negative for polydipsia.      Objective:     BP 118/74 (BP Location: Left Arm, Patient Position: Sitting, Cuff Size: Normal)   Pulse 85   Temp (!) 97.1 F (36.2 C) (Temporal)   Resp 18   Ht 5' 11.5" (1.816 m)   Wt 202 lb 6.4 oz (91.8 kg)   SpO2 99%   BMI 27.84 kg/m    Physical Exam Constitutional:      General: He is not in acute distress.    Appearance: Normal appearance. He is not ill-appearing, toxic-appearing or diaphoretic.  HENT:     Head: Normocephalic and atraumatic.     Right Ear: External ear normal.     Left Ear: External ear normal.     Mouth/Throat:     Mouth: Mucous membranes are moist.     Pharynx: Oropharynx is clear. No oropharyngeal exudate or posterior oropharyngeal erythema.  Eyes:     General: No scleral icterus.       Right eye: No discharge.         Left eye: No discharge.     Extraocular Movements: Extraocular movements intact.     Conjunctiva/sclera: Conjunctivae normal.     Pupils: Pupils are equal, round, and reactive to light.  Cardiovascular:     Rate and Rhythm: Normal rate and regular rhythm.  Pulmonary:     Effort: Pulmonary effort is normal. No respiratory distress.     Breath sounds: Normal breath sounds.  Abdominal:     General: Bowel sounds are normal.     Tenderness: There is no abdominal tenderness. There is no guarding.     Hernia: A hernia is present. There is no hernia in the left inguinal area or right inguinal area.  Genitourinary:    Penis: Circumcised. No hypospadias, erythema, tenderness, discharge, swelling or lesions.      Testes:        Right: Mass, tenderness or swelling not present. Right testis is descended.        Left: Mass, tenderness or swelling not present. Left testis is descended.     Epididymis:     Right: Not inflamed or enlarged.     Left: Not inflamed or enlarged.     Prostate: Not enlarged, not tender and no nodules present.  Rectum: Guaiac result negative. No mass, tenderness, anal fissure, external hemorrhoid or internal hemorrhoid. Normal anal tone.  Musculoskeletal:     Cervical back: No rigidity or tenderness.  Lymphadenopathy:     Lower Body: No right inguinal adenopathy. No left inguinal adenopathy.  Skin:    General: Skin is warm and dry.  Neurological:     Mental Status: He is alert and oriented to person, place, and time.  Psychiatric:        Mood and Affect: Mood normal.        Behavior: Behavior normal.      No results found for any visits on 07/26/22.    The 10-year ASCVD risk score (Arnett DK, et al., 2019) is: 9.4%    Assessment & Plan:   Problem List Items Addressed This Visit       Endocrine   Type 2 diabetes mellitus without complication, without long-term current use of insulin ([facility])   Relevant Medications   metFORMIN (GLUCOPHAGE-XR) 500 MG 24 hr  tablet   glipiZIDE (GLUCOTROL) 10 MG tablet   Other Relevant Orders   CBC   Comprehensive metabolic panel   Hemoglobin A1c   Urinalysis, Routine w reflex microscopic   Microalbumin / creatinine urine ratio     Other   Vitamin B 12 deficiency   Relevant Orders   Vitamin B12   CBC   Healthcare maintenance - Primary   Relevant Orders   Comprehensive metabolic panel   PSA   Elevated cholesterol   Relevant Orders   Comprehensive metabolic panel   Lipid panel    Return in about 6 months (around 01/24/2023).  Recently restarted Zocor.  Continue glipizide and metformin.  Adjustments made pending results of today's labs.  Libby Maw, MD

## 2022-07-26 NOTE — Telephone Encounter (Signed)
1st no show, fee waived, letter sent, pt has RS

## 2022-07-27 ENCOUNTER — Other Ambulatory Visit (HOSPITAL_COMMUNITY): Payer: Self-pay | Admitting: Psychiatry

## 2022-07-27 MED ORDER — SIMVASTATIN 20 MG PO TABS: ORAL_TABLET | ORAL | 3 refills | Status: DC

## 2022-07-27 NOTE — Addendum Note (Signed)
Addended by: Jon Billings on: 07/27/2022 07:40 AM   Modules accepted: Orders

## 2022-07-29 ENCOUNTER — Encounter: Payer: Self-pay | Admitting: Family Medicine

## 2022-07-29 ENCOUNTER — Ambulatory Visit (INDEPENDENT_AMBULATORY_CARE_PROVIDER_SITE_OTHER): Payer: Medicare Other | Admitting: Family Medicine

## 2022-07-29 VITALS — BP 136/80 | HR 90 | Temp 97.1°F | Ht 71.0 in | Wt 200.0 lb

## 2022-07-29 DIAGNOSIS — E7800 Pure hypercholesterolemia, unspecified: Secondary | ICD-10-CM

## 2022-07-29 DIAGNOSIS — E119 Type 2 diabetes mellitus without complications: Secondary | ICD-10-CM

## 2022-07-29 NOTE — Progress Notes (Signed)
Established Patient Office Visit   Subjective:  Patient ID: Glenn Robertson, male    DOB: 06-16-1966  Age: 56 y.o. MRN: [id number]  Chief Complaint  Patient presents with   Diabetes    Discuss diabetes/HgA1c.     Diabetes Pertinent negatives for diabetes include no blurred vision, no polydipsia and no weakness.   Encounter Diagnoses  Name Primary?   Elevated cholesterol Yes   Type 2 diabetes mellitus without complication, without long-term current use of insulin (HCC)    Here to discuss the increase of his hemoglobin A1c from 7.7-8.8.  He admits to dietary indulgences with sweets he drinks, sweets and other carbohydrates.  He believes that one of his psych meds has increased his appetite.  Exercises been difficult secondary to left foot injury.   Review of Systems  Constitutional: Negative.   HENT: Negative.    Eyes:  Negative for blurred vision, discharge and redness.  Respiratory: Negative.    Cardiovascular: Negative.   Gastrointestinal:  Negative for abdominal pain.  Genitourinary: Negative.   Musculoskeletal: Negative.  Negative for myalgias.  Skin:  Negative for rash.  Neurological:  Negative for tingling, loss of consciousness and weakness.  Endo/Heme/Allergies:  Negative for polydipsia.     Current Outpatient Medications:    ACCU-CHEK SOFTCLIX LANCETS lancets, 3 (three) times daily. for testing, Disp: , Rfl: 0   albuterol (VENTOLIN HFA) 108 (90 Base) MCG/ACT inhaler, Inhale 1-2 puffs into the lungs every 6 (six) hours as needed for wheezing or shortness of breath., Disp: 18 g, Rfl: 1   amantadine (SYMMETREL) 100 MG capsule, TAKE 1 CAPSULE(100 MG) BY MOUTH TWICE DAILY, Disp: 180 capsule, Rfl: 0   Cariprazine HCl (VRAYLAR) 6 MG CAPS, TAKE 1 CAPSULE(6 MG) BY MOUTH DAILY, Disp: 30 capsule, Rfl: 1   clarithromycin (BIAXIN XL) 500 MG 24 hr tablet, Take 2 tablets (1,000 mg total) by mouth daily for 10 days., Disp: 20 tablet, Rfl: 0   clonazePAM (KLONOPIN) 1 MG tablet,  Take 1 tablet (1 mg total) by mouth 3 (three) times daily as needed for anxiety. for anxiety, Disp: 90 tablet, Rfl: 1   cyanocobalamin (VITAMIN B12) 1000 MCG tablet, Take 1,000 mcg by mouth daily., Disp: , Rfl:    diphenhydrAMINE (BENADRYL) 25 mg capsule, Take 25 mg by mouth every 6 (six) hours as needed., Disp: , Rfl:    fexofenadine (ALLEGRA ODT) 30 MG disintegrating tablet, Take 30 mg by mouth daily., Disp: , Rfl:    fluPHENAZine (PROLIXIN) 5 MG tablet, Take one tablet ( 5 mgr)  at bedtime, Disp: 30 tablet, Rfl: 1   fluticasone (FLONASE) 50 MCG/ACT nasal spray, SHAKE LIQUID AND USE 2 SPRAYS IN EACH NOSTRIL DAILY, Disp: 16 g, Rfl: 6   glipiZIDE (GLUCOTROL) 10 MG tablet, TAKE 1 TABLET(10 MG) BY MOUTH TWICE DAILY, Disp: 180 tablet, Rfl: 3   glucose blood (ACCU-CHEK GUIDE) test strip, USE TO TEST 3 TIMES DAILY, Disp: 100 strip, Rfl: 3   JARDIANCE 25 MG TABS tablet, TAKE 1 TABLET BY MOUTH DAILY, Disp: 90 tablet, Rfl: 2   metFORMIN (GLUCOPHAGE-XR) 500 MG 24 hr tablet, TAKE 2 TABLETS(1000 MG) BY MOUTH TWICE DAILY, Disp: 360 tablet, Rfl: 3   montelukast (SINGULAIR) 10 MG tablet, TAKE 1 TABLET(10 MG) BY MOUTH AT BEDTIME, Disp: 90 tablet, Rfl: 3   RESTASIS 0.05 % ophthalmic emulsion, , Disp: , Rfl:    sertraline (ZOLOFT) 100 MG tablet, Take 1 tablet (100 mg total) by mouth daily., Disp: 30 tablet, Rfl: 1  simvastatin (ZOCOR) 20 MG tablet, TAKE 1 TABLET(20 MG) BY MOUTH EVERY EVENING, Disp: 90 tablet, Rfl: 3   tadalafil (CIALIS) 20 MG tablet, TAKE ONE-HALF TO ONE TABLET BY MOUTH EVERY OTHER DAY AS NEEDED FOR ERECTILE DYSFUNCTION, Disp: 10 tablet, Rfl: 2   triamcinolone ointment (KENALOG) 0.1 %, Apply a thin coat to rash on elbows twice daily, Disp: 30 g, Rfl: 0   TYRVAYA 0.03 MG/ACT SOLN, Spray 1 spray in each nostril twice daily, approximately 12 hours apart, Disp: , Rfl:   Current Facility-Administered Medications:    methylPREDNISolone acetate (DEPO-MEDROL) injection 80 mg, 80 mg, Other, Once, Magnus Sinning, MD   Objective:     BP 136/80 (BP Location: Left Arm, Patient Position: Sitting, Cuff Size: Normal)   Pulse 90   Temp (!) 97.1 F (36.2 C) (Temporal)   Ht '5\' 11"'$  (1.803 m)   Wt 200 lb (90.7 kg)   SpO2 97%   BMI 27.89 kg/m  Wt Readings from Last 3 Encounters:  07/29/22 200 lb (90.7 kg)  07/26/22 202 lb 6.4 oz (91.8 kg)  07/19/22 201 lb (91.2 kg)      Physical Exam Constitutional:      General: He is not in acute distress.    Appearance: Normal appearance. He is not ill-appearing, toxic-appearing or diaphoretic.  HENT:     Head: Normocephalic and atraumatic.     Right Ear: External ear normal.     Left Ear: External ear normal.  Eyes:     General: No scleral icterus.       Right eye: No discharge.        Left eye: No discharge.     Extraocular Movements: Extraocular movements intact.     Conjunctiva/sclera: Conjunctivae normal.  Pulmonary:     Effort: Pulmonary effort is normal. No respiratory distress.  Skin:    General: Skin is warm and dry.  Neurological:     Mental Status: He is alert and oriented to person, place, and time.  Psychiatric:        Mood and Affect: Mood normal.        Behavior: Behavior normal.      No results found for any visits on 07/29/22.    The 10-year ASCVD risk score (Arnett DK, et al., 2019) is: 11.5%    Assessment & Plan:   Elevated cholesterol  Type 2 diabetes mellitus without complication, without long-term current use of insulin (HCC)  We discussed the importance of carbohydrate restriction.  He was given information on carbohydrate counting.  Suggested diabetic teaching.  He declines for now.  He will work on losing weight as well and exercising as tolerated.  He confirms compliance with the simvastatin.  Encouraged him to speak with psychiatry about his medication schedule.  Return Follow-up as previously planned.Libby Maw, MD

## 2022-08-01 ENCOUNTER — Telehealth: Payer: Self-pay | Admitting: Family Medicine

## 2022-08-01 NOTE — Telephone Encounter (Signed)
Caller Name: Magnus Crescenzo Call back phone #: [phone]  Reason for Call: Asked for a call from [facility], was not specific as to why

## 2022-08-02 NOTE — Telephone Encounter (Signed)
Returned call no answer LMTCB. PA pending have sent message via Mychart informing patient.

## 2022-08-03 ENCOUNTER — Ambulatory Visit (INDEPENDENT_AMBULATORY_CARE_PROVIDER_SITE_OTHER): Payer: Medicare Other | Admitting: Orthopaedic Surgery

## 2022-08-03 ENCOUNTER — Ambulatory Visit (INDEPENDENT_AMBULATORY_CARE_PROVIDER_SITE_OTHER): Payer: Medicare Other

## 2022-08-03 ENCOUNTER — Encounter: Payer: Self-pay | Admitting: Orthopaedic Surgery

## 2022-08-03 VITALS — BP 121/69 | HR 92 | Ht 71.0 in | Wt 200.0 lb

## 2022-08-03 DIAGNOSIS — S92424A Nondisplaced fracture of distal phalanx of right great toe, initial encounter for closed fracture: Secondary | ICD-10-CM
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
## 2022-08-03 NOTE — Telephone Encounter (Signed)
Spoke with patient who verbally understood PA was denied for extra test strips pr patient he have started back riding his bike so if he feels he will need more he will pay out of pocket.

## 2022-08-09 ENCOUNTER — Encounter (HOSPITAL_COMMUNITY): Payer: Self-pay | Admitting: Psychiatry

## 2022-08-09 ENCOUNTER — Telehealth (HOSPITAL_BASED_OUTPATIENT_CLINIC_OR_DEPARTMENT_OTHER): Payer: Medicare Other | Admitting: Psychiatry

## 2022-08-09 DIAGNOSIS — F200 Paranoid schizophrenia: Secondary | ICD-10-CM

## 2022-08-09 DIAGNOSIS — R404 Transient alteration of awareness: Secondary | ICD-10-CM

## 2022-08-09 MED ORDER — FLUPHENAZINE HCL 5 MG PO TABS: ORAL_TABLET | ORAL | 1 refills | Status: DC

## 2022-08-09 MED ORDER — CLONAZEPAM 1 MG PO TABS: 1.0000 mg | ORAL_TABLET | Freq: Three times a day (TID) | ORAL | 1 refills | Status: DC | PRN

## 2022-08-09 MED ORDER — SERTRALINE HCL 100 MG PO TABS: 100.0000 mg | ORAL_TABLET | Freq: Every day | ORAL | 1 refills | Status: DC

## 2022-08-09 MED ORDER — VRAYLAR 6 MG PO CAPS: ORAL_CAPSULE | ORAL | 1 refills | Status: DC

## 2022-08-09 NOTE — Progress Notes (Signed)
Patient ID: Glenn Robertson, male   DOB: 21-Jan-1966, 56 y.o.   MRN: [id number]  .[place] MD/PA/NP OP Progress Note 06/01/2022  Glenn Robertson  MRN:  [id number]   Chief Complaint:  Patient returns for medication management  This was a phone based visit.  Patient's identity verified using 2 different identifiers and limitations associated with this type of communication have been reviewed. Location of parties Patient reports he is currently in [place] visiting family Clinician- [facility] outpatient clinic    Duration 20 minutes   HPI: 56yo male, who has been diagnosed with schizoaffective disorder. On disability. Lives with mother . He was being followed by Dr . Montel Culver, who has left the clinic, and I am providing follow ups /bridging until new provider starts .    Glenn Robertson reports that he is currently visiting an aunt in [place].  He states that visit has gone well and he has seen some of his extended family.   He is planning on returning to [place] later this week.  He reports he is doing "all right", and describes functioning well in his daily activities. He continues to have an interest in languages and is currently learning/perfecting his Spanish. He had recently fractured his toe.  He reports he is gradually healing.  Did not require surgery.  He does not endorse any increased paranoid ideations in the context of travel to [place] and visiting with family. He states " nothing suspicious has happened , I haven't seen any suspicious vehicles ".   Denies medication side effects.  He does report intermittent ("comes and goes") tremor affecting his left hand.  He states this has been present for years.    Overall, he reports current medication regimen is well-tolerated and he describes good compliance/awareness that medications have helped decrease psychiatric symptoms.  He reports for example that "since I started taking the medications I have not had any hallucinations anymore".  We have  reviewed side effects, including potential sedation and abuse potential secondary to benzodiazepines (he has been taking Klonopin for years).  He denies any abuse/misuse or side effects associated with BZD at this time.  We have also reviewed r potential risk of withdrawal symptoms should he stop BZD or amantadine abruptly.  He expresses awareness.  11/28 hemoglobin A1c 8.8, lipid panel unremarkable.  Patient reports he is aware that DM has not been well controlled. States he has not been following a strict diabetic diet ( " I was eating chips and cookies") but reports that he is now making an increased effort to adjust his diet . He follows with his PCP  Writer is leaving clinic as of March 2024.  I have informed patient of this.  He expresses awareness.  After that date, he may continue to follow with another [facility] clinician.          Visit Diagnosis:  No diagnosis found.  Past Psychiatric History: Please see intake H&P.  Past Medical History:  Past Medical History:  Diagnosis Date   Allergy    dog and cats, citrus   Anxiety    Asthma    Chronic pain    Depression    Diabetes mellitus    Glaucoma    Neuromuscular disorder ([place])    Paranoid schizophrenia ([place])     Past Surgical History:  Procedure Laterality Date   ANKLE ARTHROSCOPY     Arm Surgery  1/12   rt ulnar nerve decompression   EPIDURAL BLOCK INJECTION     several  ULNAR NERVE TRANSPOSITION  01/19/2012   Procedure: ULNAR NERVE DECOMPRESSION/TRANSPOSITION;  Surgeon: Cammie Sickle., MD;  Location: [facility];  Service: Orthopedics;  Laterality: Left;  ulnar nerve decompression vs transposition left elbow    Family Psychiatric History: None.  Family History:  Family History  Problem Relation Age of Onset   Diabetes Father    Diabetes Paternal Uncle    Colon cancer Neg Hx    Colon polyps Neg Hx    Esophageal cancer Neg Hx    Rectal cancer Neg Hx    Stomach cancer Neg Hx     Social  History:  Social History   Socioeconomic History   Marital status: Single    Spouse name: Not on file   Number of children: 0   Years of education: BA   Highest education level: Not on file  Occupational History   Occupation: Unemlpoyed-disabled  Tobacco Use   Smoking status: Never   Smokeless tobacco: Never  Vaping Use   Vaping Use: Never used  Substance and Sexual Activity   Alcohol use: No   Drug use: No   Sexual activity: Yes  Other Topics Concern   Not on file  Social History Narrative   Lives with mother   Caffeine use: Coke very rare   Right handed    Social Determinants of Health   Financial Resource Strain: Low Risk  (09/21/2021)   Overall Financial Resource Strain (CARDIA)    Difficulty of Paying Living Expenses: Not hard at all  Food Insecurity: No Food Insecurity (09/21/2021)   Hunger Vital Sign    Worried About Running Out of Food in the Last Year: Never true    Ran Out of Food in the Last Year: Never true  Transportation Needs: No Transportation Needs (09/21/2021)   PRAPARE - Hydrologist (Medical): No    Lack of Transportation (Non-Medical): No  Physical Activity: Insufficiently Active (09/21/2021)   Exercise Vital Sign    Days of Exercise per Week: 1 day    Minutes of Exercise per Session: 20 min  Stress: No Stress Concern Present (09/21/2021)   [place]    Feeling of Stress : Not at all  Social Connections: Socially Isolated (09/21/2021)   Social Connection and Isolation Panel [NHANES]    Frequency of Communication with Friends and Family: Three times a week    Frequency of Social Gatherings with Friends and Family: Three times a week    Attends Religious Services: Never    Active Member of Clubs or Organizations: No    Attends Archivist Meetings: Not on file    Marital Status: Never married    Allergies:  Allergies  Allergen Reactions   Cat  Hair Extract    Citric Acid Other (See Comments)    Runny nose, eyes water   Food     Oranges or anything with citric acid   Gabapentin     Walking into things, hard to maintain balance, falls    Metabolic Disorder Labs: Lab Results  Component Value Date   HGBA1C 8.8 (H) 07/26/2022   MPG 123 03/02/2018   No results found for: "PROLACTIN" Lab Results  Component Value Date   CHOL 131 07/26/2022   TRIG 59.0 07/26/2022   HDL 61.00 07/26/2022   CHOLHDL 2 07/26/2022   VLDL 11.8 07/26/2022   LDLCALC 58 07/26/2022   LDLCALC 77 07/15/2021   Lab Results  Component Value Date   TSH 1.02 07/08/2019   TSH 0.92 03/15/2018    Therapeutic Level Labs: No results found for: "LITHIUM" No results found for: "VALPROATE" No results found for: "CBMZ"  Current Medications: Current Outpatient Medications  Medication Sig Dispense Refill   ACCU-CHEK SOFTCLIX LANCETS lancets 3 (three) times daily. for testing  0   albuterol (VENTOLIN HFA) 108 (90 Base) MCG/ACT inhaler Inhale 1-2 puffs into the lungs every 6 (six) hours as needed for wheezing or shortness of breath. 18 g 1   amantadine (SYMMETREL) 100 MG capsule TAKE 1 CAPSULE(100 MG) BY MOUTH TWICE DAILY 180 capsule 0   Cariprazine HCl (VRAYLAR) 6 MG CAPS TAKE 1 CAPSULE(6 MG) BY MOUTH DAILY 30 capsule 1   clonazePAM (KLONOPIN) 1 MG tablet Take 1 tablet (1 mg total) by mouth 3 (three) times daily as needed for anxiety. for anxiety 90 tablet 1   cyanocobalamin (VITAMIN B12) 1000 MCG tablet Take 1,000 mcg by mouth daily.     diphenhydrAMINE (BENADRYL) 25 mg capsule Take 25 mg by mouth every 6 (six) hours as needed.     fexofenadine (ALLEGRA ODT) 30 MG disintegrating tablet Take 30 mg by mouth daily.     fluPHENAZine (PROLIXIN) 5 MG tablet Take one tablet ( 5 mgr)  at bedtime 30 tablet 1   fluticasone (FLONASE) 50 MCG/ACT nasal spray SHAKE LIQUID AND USE 2 SPRAYS IN EACH NOSTRIL DAILY 16 g 6   glipiZIDE (GLUCOTROL) 10 MG tablet TAKE 1 TABLET(10 MG)  BY MOUTH TWICE DAILY 180 tablet 3   glucose blood (ACCU-CHEK GUIDE) test strip USE TO TEST 3 TIMES DAILY 100 strip 3   JARDIANCE 25 MG TABS tablet TAKE 1 TABLET BY MOUTH DAILY 90 tablet 2   metFORMIN (GLUCOPHAGE-XR) 500 MG 24 hr tablet TAKE 2 TABLETS(1000 MG) BY MOUTH TWICE DAILY 360 tablet 3   montelukast (SINGULAIR) 10 MG tablet TAKE 1 TABLET(10 MG) BY MOUTH AT BEDTIME 90 tablet 3   RESTASIS 0.05 % ophthalmic emulsion      sertraline (ZOLOFT) 100 MG tablet Take 1 tablet (100 mg total) by mouth daily. 30 tablet 1   simvastatin (ZOCOR) 20 MG tablet TAKE 1 TABLET(20 MG) BY MOUTH EVERY EVENING 90 tablet 3   tadalafil (CIALIS) 20 MG tablet TAKE ONE-HALF TO ONE TABLET BY MOUTH EVERY OTHER DAY AS NEEDED FOR ERECTILE DYSFUNCTION 10 tablet 2   triamcinolone ointment (KENALOG) 0.1 % Apply a thin coat to rash on elbows twice daily 30 g 0   TYRVAYA 0.03 MG/ACT SOLN Spray 1 spray in each nostril twice daily, approximately 12 hours apart     Current Facility-Administered Medications  Medication Dose Route Frequency Provider Last Rate Last Admin   methylPREDNISolone acetate (DEPO-MEDROL) injection 80 mg  80 mg Other Once Magnus Sinning, MD           Psychiatric Specialty Exam: Please note limitations in obtaining a full mental status exam in the context of phone communication ROS reports improving toe pain (recently fractured)  There were no vitals taken for this visit.There is no height or weight on file to calculate BMI.  General Appearance: NA  Eye Contact: N/A  Speech:  Normal / fluent   Volume:  Normal  Mood: Reports mood as stable and currently presents euthymic.  He does not endorse depression.  Affect: Presents appropriate  Thought Process: Thought process presents linear and intact  Orientation:  Full (Time, Place, and Person)  Thought Content: Denies hallucinations he has a history  of chronic paranoid ideations as described above, but reports that he has recently seen any suspicious  activity or vehicles, and does not present preoccupied or ocused on paranoid or self referential ideations at this time  Suicidal Thoughts:  No denies SI or self injurious ideations  Homicidal Thoughts:  No   no homicidal ideations  Memory: recent and remote grossly intact   Judgement:  present   Insight:  Fair  Psychomotor Activity: N/A  Concentration:  Good   Recall:  Good  Fund of Knowledge: Good  Language: Good  Akathisia:  Negative  Handed:  Right  AIMS (if indicated): as above   Assets:  Communication Skills Desire for Improvement Financial Resources/Insurance Housing  ADL's:  Intact  Cognition: WNL  Sleep:  reports sleeping " OK"    Screenings: [facility] Office Visit from 07/26/2022 in [facility]  Total GAD-7 Score 5      PHQ2-9    [place] from 07/26/2022 in [facility] from 07/19/2022 in [facility] from 07/05/2022 in [facility] from 05/12/2022 in [facility] Office Visit from 10/07/2021 in [facility]  PHQ-2 Total Score 2 0 3 2 0  PHQ-9 Total Score 7 -- -- 8 --      Flowsheet Row Counselor from 06/27/2022 in [facility] ED from 06/25/2022 in [facility] DEPT Counselor from 05/12/2022 in [facility] No Risk No Risk No Risk        Assessment and Plan: 56 yo single AAM with schizophrenia paranoid type vs schizoaffective disorder. On disability, lives with mother   Currently Mr. Skibicki presents stable.  He is currently visiting family in [place], [place] to return to [place] within the next few days.  He states that his visit has gone well thus far and he does not endorse/denies any increased paranoid ideations or emergence of psychotic  symptoms.  He reports he has not recently seen any suspicious vehicles or activity and does not currently appear to be focused on self referential or paranoid thoughts.  He is tolerating current medication regimen well and does not endorse side effects at this time.  Side effect profile reviewed. We have reviewed termination issues/clinician will be leaving clinic in March 2024.  Patient expresses awareness.  Will continue current medication regimen.  (Patient follows up with outpatient neurologist..  He had an EEG done in October which was reported to him as normal.  I have encouraged him to continue following up with neurologist if recommended and not to drive unless specifically approved by his neurologist.)       Plan:  Continue Zoloft 100 mg  QDAY for depression, anxiety Continue Vraylar 6 mg for paranoia, psychosis Continue Klonopin 1 mg TID PRN for anxiety Continue Amantadine 100 mg BID to minimize tremors /EPS  Continue Fluphenazine 5 mg QHS for paranoia , psychosis   Next appointment in approximately in 4-6 weeks .  Continue individual psychotherapy.  Agrees to contact clinic sooner if any worsening or concern prior. He has clinic number and crisis hotline number if needed .       Jenne Campus, MD 08/09/2022, 11:37 AM Patient ID: Glenn Robertson, male   DOB: [birth year], 56 y.o.   MRN: [id number]

## 2022-08-10 ENCOUNTER — Ambulatory Visit: Payer: Medicare Other | Admitting: Orthopaedic Surgery

## 2022-08-12 ENCOUNTER — Ambulatory Visit (INDEPENDENT_AMBULATORY_CARE_PROVIDER_SITE_OTHER): Payer: Medicare Other | Admitting: Licensed Clinical Social Worker

## 2022-08-12 DIAGNOSIS — F251 Schizoaffective disorder, depressive type: Secondary | ICD-10-CM | POA: Diagnosis not present
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
## 2022-08-30 ENCOUNTER — Encounter: Payer: Self-pay | Admitting: Family Medicine

## 2022-09-01 ENCOUNTER — Other Ambulatory Visit: Payer: Self-pay | Admitting: Family Medicine

## 2022-09-08 ENCOUNTER — Telehealth: Payer: Self-pay | Admitting: Family Medicine

## 2022-09-08 NOTE — Telephone Encounter (Signed)
Left message for patient to call back and schedule Medicare Annual Wellness Visit (AWV) in office.   If not able to come in office, please offer to do virtually or by telephone.  Left office number and my jabber [phone].  Last AWV:09/21/2021   Please schedule at anytime with Nurse Health Advisor.

## 2022-09-16 ENCOUNTER — Telehealth: Payer: Self-pay | Admitting: Family Medicine

## 2022-09-16 DIAGNOSIS — E119 Type 2 diabetes mellitus without complications: Secondary | ICD-10-CM

## 2022-09-16 NOTE — Telephone Encounter (Signed)
Caller Name: pt Call back phone #: Phone: [phone]    MEDICATION(S):  glucose blood (ACCU-CHEK GUIDE) test strip [[id number]]   Days of Med Remaining: he's out  Has the patient contacted their pharmacy (YES/NO)? Yes contact your PCP   Preferred Pharmacy:  [facility] [place], [name] - [address] [place], [place] [address] Phone: [phone]  Fax: [phone] DEA #: [id number]

## 2022-09-16 NOTE — Telephone Encounter (Signed)
Refills sent November 2023 called patient to inform that Rx was already sent in no answer LMTCB

## 2022-09-19 ENCOUNTER — Encounter (HOSPITAL_COMMUNITY): Payer: Self-pay | Admitting: Psychiatry

## 2022-09-19 ENCOUNTER — Telehealth (HOSPITAL_BASED_OUTPATIENT_CLINIC_OR_DEPARTMENT_OTHER): Payer: Medicare Other | Admitting: Psychiatry

## 2022-09-19 DIAGNOSIS — R404 Transient alteration of awareness: Secondary | ICD-10-CM | POA: Diagnosis not present

## 2022-09-19 DIAGNOSIS — F251 Schizoaffective disorder, depressive type: Secondary | ICD-10-CM | POA: Diagnosis not present

## 2022-09-19 MED ORDER — VRAYLAR 6 MG PO CAPS: ORAL_CAPSULE | ORAL | 2 refills | Status: DC

## 2022-09-19 MED ORDER — AMANTADINE HCL 100 MG PO CAPS: ORAL_CAPSULE | ORAL | 1 refills | Status: DC

## 2022-09-19 MED ORDER — FLUPHENAZINE HCL 5 MG PO TABS: ORAL_TABLET | ORAL | 2 refills | Status: DC

## 2022-09-19 MED ORDER — SERTRALINE HCL 100 MG PO TABS: 100.0000 mg | ORAL_TABLET | Freq: Every day | ORAL | 2 refills | Status: DC

## 2022-09-19 MED ORDER — CLONAZEPAM 1 MG PO TABS: 1.0000 mg | ORAL_TABLET | Freq: Three times a day (TID) | ORAL | 2 refills | Status: DC | PRN

## 2022-09-19 NOTE — Progress Notes (Signed)
Patient ID: Glenn Robertson, male   DOB: [birth year], 57 y.o.   MRN: [id number]  .[place] MD/PA/NP OP Progress Note 06/01/2022  NOTNAMED SCHOLZ  MRN:  [id number]   Chief Complaint:  Patient returns for medication management  This was a phone based visit.  Patient's identity verified using 2 different identifiers and limitations associated with this type of communication have been reviewed. Location of parties Patient reports he is currently in [place] visiting family Clinician- [facility] outpatient clinic    Duration 20 minutes   HPI: 57yo male, who has been diagnosed with schizoaffective disorder. On disability. Lives with mother . He was being followed by Dr . Montel Culver, who has left the clinic, and I am providing follow ups /bridging until new provider starts .     Mr. Rosengrant reports that he has been "all right". He recently returned from [place] where he was visiting family. He lives with his elderly mother, helps her in daily activities. He has a history of chronic paranoid ideations, which have been present for many years.  Currently these appear well-controlled.  He reports he has not had seen or experienced any recent suspicious activities.  He reports he has not recently seen any suspicious vehicles or persons. He denies hallucinations .  No delusions are expressed at this time.  Thought process presents linear. Mood is described as "all right".  Does not endorse feeling depressed.  Does not endorse anhedonia.  Affect presents reactive.  He had fracture of the great toe a few weeks ago.  Did not require surgery.  Reports pain has subsided but is concerned that with certain movements he hears a "popping" sound on his toe.  I have recommended that he see his orthopedist to review.  He has a history of DM .  Reports that hemoglobin A1c had increased when last checked.  He states that he has been making more consistent effort to limit fast food/snacks and make healthier food choices.   Does  not endorse medication side effects. (He does report history of sexual dysfunction which he states is attributed to underlying diabetes.   He reports he has difficulty staying still, sitting down after a period of about half an hour when in the past he was able to sit down for long periods of time to read her study.  When patient was last seen in office he presented calm, without restlessness, without overt akathisia.  Medication side effects reviewed-as noted patient is on 2 antipsychotics (Vraylar and Prolixin).  Paranoia/psychotic symptoms have responded well to this regimen which has resulted in an improved quality of life.  Due to this 2 antipsychotic regimen has been continued. Reviewed side effects, to include potential risk of metabolic, motor side effects on antipsychotics, potential risk of sexual dysfunction on sertraline.  Also reviewed potential for drowsiness and for withdrawal if abruptly stopped on Klonopin (has taken Klonopin for a long time, no history of misuse/abuse, denies side effects).  Also cautioned patient not to abruptly stop Symmetrel as this could cause withdrawal symptoms as well.   Data reviewed termination issues.  Writer is leaving clinic as of March 2024. Marland Kitchen  He expresses awareness.           Visit Diagnosis:  No diagnosis found.  Past Psychiatric History: Please see intake H&P.  Past Medical History:  Past Medical History:  Diagnosis Date   Allergy    dog and cats, citrus   Anxiety    Asthma    Chronic pain  Depression    Diabetes mellitus    Glaucoma    Neuromuscular disorder ([place])    Paranoid schizophrenia ([place])     Past Surgical History:  Procedure Laterality Date   ANKLE ARTHROSCOPY     Arm Surgery  1/12   rt ulnar nerve decompression   EPIDURAL BLOCK INJECTION     several   ULNAR NERVE TRANSPOSITION  01/19/2012   Procedure: ULNAR NERVE DECOMPRESSION/TRANSPOSITION;  Surgeon: Cammie Sickle., MD;  Location: [place];   Service: Orthopedics;  Laterality: Left;  ulnar nerve decompression vs transposition left elbow    Family Psychiatric History: None.  Family History:  Family History  Problem Relation Age of Onset   Diabetes Father    Diabetes Paternal Uncle    Colon cancer Neg Hx    Colon polyps Neg Hx    Esophageal cancer Neg Hx    Rectal cancer Neg Hx    Stomach cancer Neg Hx     Social History:  Social History   Socioeconomic History   Marital status: Single    Spouse name: Not on file   Number of children: 0   Years of education: BA   Highest education level: Not on file  Occupational History   Occupation: Unemlpoyed-disabled  Tobacco Use   Smoking status: Never   Smokeless tobacco: Never  Vaping Use   Vaping Use: Never used  Substance and Sexual Activity   Alcohol use: No   Drug use: No   Sexual activity: Yes  Other Topics Concern   Not on file  Social History Narrative   Lives with mother   Caffeine use: Coke very rare   Right handed    Social Determinants of Health   Financial Resource Strain: Low Risk  (09/21/2021)   Overall Financial Resource Strain (CARDIA)    Difficulty of Paying Living Expenses: Not hard at all  Food Insecurity: No Food Insecurity (09/21/2021)   Hunger Vital Sign    Worried About Running Out of Food in the Last Year: Never true    Ran Out of Food in the Last Year: Never true  Transportation Needs: No Transportation Needs (09/21/2021)   PRAPARE - Hydrologist (Medical): No    Lack of Transportation (Non-Medical): No  Physical Activity: Insufficiently Active (09/21/2021)   Exercise Vital Sign    Days of Exercise per Week: 1 day    Minutes of Exercise per Session: 20 min  Stress: No Stress Concern Present (09/21/2021)   [place]    Feeling of Stress : Not at all  Social Connections: Socially Isolated (09/21/2021)   Social Connection and Isolation Panel  [NHANES]    Frequency of Communication with Friends and Family: Three times a week    Frequency of Social Gatherings with Friends and Family: Three times a week    Attends Religious Services: Never    Active Member of Clubs or Organizations: No    Attends Archivist Meetings: Not on file    Marital Status: Never married    Allergies:  Allergies  Allergen Reactions   Cat Hair Extract    Citric Acid Other (See Comments)    Runny nose, eyes water   Food     Oranges or anything with citric acid   Gabapentin     Walking into things, hard to maintain balance, falls    Metabolic Disorder Labs: Lab Results  Component Value Date  HGBA1C 8.8 (H) 07/26/2022   MPG 123 03/02/2018   No results found for: "PROLACTIN" Lab Results  Component Value Date   CHOL 131 07/26/2022   TRIG 59.0 07/26/2022   HDL 61.00 07/26/2022   CHOLHDL 2 07/26/2022   VLDL 11.8 07/26/2022   LDLCALC 58 07/26/2022   LDLCALC 77 07/15/2021   Lab Results  Component Value Date   TSH 1.02 07/08/2019   TSH 0.92 03/15/2018    Therapeutic Level Labs: No results found for: "LITHIUM" No results found for: "VALPROATE" No results found for: "CBMZ"  Current Medications: Current Outpatient Medications  Medication Sig Dispense Refill   ACCU-CHEK SOFTCLIX LANCETS lancets 3 (three) times daily. for testing  0   albuterol (VENTOLIN HFA) 108 (90 Base) MCG/ACT inhaler Inhale 1-2 puffs into the lungs every 6 (six) hours as needed for wheezing or shortness of breath. 18 g 1   amantadine (SYMMETREL) 100 MG capsule TAKE 1 CAPSULE(100 MG) BY MOUTH TWICE DAILY 180 capsule 0   Cariprazine HCl (VRAYLAR) 6 MG CAPS TAKE 1 CAPSULE(6 MG) BY MOUTH DAILY 30 capsule 1   clonazePAM (KLONOPIN) 1 MG tablet Take 1 tablet (1 mg total) by mouth 3 (three) times daily as needed for anxiety. for anxiety 90 tablet 1   cyanocobalamin (VITAMIN B12) 1000 MCG tablet Take 1,000 mcg by mouth daily.     diphenhydrAMINE (BENADRYL) 25 mg  capsule Take 25 mg by mouth every 6 (six) hours as needed.     fexofenadine (ALLEGRA ODT) 30 MG disintegrating tablet Take 30 mg by mouth daily.     fluPHENAZine (PROLIXIN) 5 MG tablet Take one tablet ( 5 mgr)  at bedtime 30 tablet 1   fluticasone (FLONASE) 50 MCG/ACT nasal spray SHAKE LIQUID AND USE 2 SPRAYS IN EACH NOSTRIL DAILY 16 g 6   glipiZIDE (GLUCOTROL) 10 MG tablet TAKE 1 TABLET(10 MG) BY MOUTH TWICE DAILY 180 tablet 3   glucose blood (ACCU-CHEK GUIDE) test strip USE TO TEST 3 TIMES DAILY 100 strip 3   JARDIANCE 25 MG TABS tablet TAKE 1 TABLET BY MOUTH DAILY 90 tablet 2   metFORMIN (GLUCOPHAGE-XR) 500 MG 24 hr tablet TAKE 2 TABLETS(1000 MG) BY MOUTH TWICE DAILY 360 tablet 3   montelukast (SINGULAIR) 10 MG tablet TAKE 1 TABLET(10 MG) BY MOUTH AT BEDTIME 90 tablet 3   RESTASIS 0.05 % ophthalmic emulsion      sertraline (ZOLOFT) 100 MG tablet Take 1 tablet (100 mg total) by mouth daily. 30 tablet 1   simvastatin (ZOCOR) 20 MG tablet TAKE 1 TABLET(20 MG) BY MOUTH EVERY EVENING 90 tablet 3   tadalafil (CIALIS) 20 MG tablet TAKE ONE-HALF TO ONE TABLET BY MOUTH EVERY OTHER DAY AS NEEDED FOR ERECTILE DYSFUNCTION 10 tablet 2   triamcinolone ointment (KENALOG) 0.1 % Apply a thin coat to rash on elbows twice daily 30 g 0   TYRVAYA 0.03 MG/ACT SOLN Spray 1 spray in each nostril twice daily, approximately 12 hours apart     Current Facility-Administered Medications  Medication Dose Route Frequency Provider Last Rate Last Admin   methylPREDNISolone acetate (DEPO-MEDROL) injection 80 mg  80 mg Other Once Magnus Sinning, MD           Psychiatric Specialty Exam: Please note limitations in obtaining a full mental status exam in the context of phone communication ROS reports improving toe pain (recently fractured)  There were no vitals taken for this visit.There is no height or weight on file to calculate BMI.  General Appearance:  NA  Eye Contact: N/A  Speech:  Normal / fluent   Volume:  Normal   Mood: Reports mood as stable , presents euthymic  Affect: Presents appropriate  Thought Process: Thought process presents linear and intact  Orientation:  Full (Time, Place, and Person)  Thought Content: Denies hallucinations .  He denies experiencing any recent suspicious activity or concerns that he is being monitored or followed.  (He has a history of chronic paranoid ideations as described above, but reports that he has recently seen any suspicious activity or vehicles)  Suicidal Thoughts:  No denies SI or self injurious ideations  Homicidal Thoughts:  No   no homicidal ideations  Memory: recent and remote grossly intact   Judgement:  present   Insight:  Fair  Psychomotor Activity: N/A  Concentration:  Good   Recall:  Good  Fund of Knowledge: Good  Language: Good  Akathisia:  Negative  Handed:  Right  AIMS (if indicated): as above   Assets:  Communication Skills Desire for Improvement Financial Resources/Insurance Housing  ADL's:  Intact  Cognition: WNL  Sleep:  reports sleeping " OK"    Screenings: [place] Office Visit from 07/26/2022 in [place] at [facility]  Total GAD-7 Score 5      [place] Office Visit from 07/26/2022 in [place] at [facility] Visit from 07/19/2022 in [place] at [facility] Visit from 07/05/2022 in [place] at [facility] from 05/12/2022 in [place] at [place] from 10/07/2021 in [place] at [facility] Total Score 2 0 3 2 0  PHQ-9 Total Score 7 -- -- 8 --      Flowsheet Row Counselor from 08/12/2022 in [place] at [facility] from 06/27/2022 in [place] at [facility] ED from 06/25/2022 in [facility] Department at [place] No Risk No Risk No Risk        Assessment and Plan: 57 yo single AAM with schizophrenia paranoid type vs schizoaffective disorder. On disability, lives with mother   Currently Mr. Conwell presents stable.  He reports he is functioning well in his daily activities.  He is not currently endorsing overt paranoid ideations or self referential thoughts and reports he has not recently noted any suspicious activity or being monitored/followed.  Mood is stable.  He is tolerating current medication regimen well , although reports that he has difficulty sitting down for more than 30 minutes at a time ( question mild akathisia?)   We have reviewed medication side effects.  As above, has responded well to 2 antipsychotic ( Vraylar, Prolixin) regimen. We have reviewed termination issues/clinician will be leaving clinic in March 2024.  Patient expresses awareness.    Plan:  Continue Zoloft 100 mg  QDAY for depression, anxiety Continue Vraylar 6 mg for paranoia, psychosis Continue Klonopin 1 mg TID PRN for anxiety Continue Amantadine 100 mg BID to minimize tremors /EPS  Continue Fluphenazine 5 mg QHS for paranoia , psychosis   Next appointment in approximately in 4-6 weeks .  Continue individual psychotherapy.  Agrees to contact clinic sooner if any worsening or concern prior. He has clinic number and crisis hotline number if needed .       Jenne Campus, MD 09/19/2022, 10:18 AM Patient ID:  Glenn Robertson, male   DOB: 30-Aug-1965, 57 y.o.   MRN: [id number]

## 2022-09-21 ENCOUNTER — Encounter: Payer: Self-pay | Admitting: Family Medicine

## 2022-09-21 ENCOUNTER — Ambulatory Visit (INDEPENDENT_AMBULATORY_CARE_PROVIDER_SITE_OTHER): Payer: Medicare Other | Admitting: Family Medicine

## 2022-09-21 VITALS — BP 120/68 | HR 88 | Temp 97.4°F | Ht 71.0 in | Wt 198.6 lb

## 2022-09-21 DIAGNOSIS — E7800 Pure hypercholesterolemia, unspecified: Secondary | ICD-10-CM | POA: Diagnosis not present

## 2022-09-21 DIAGNOSIS — E1165 Type 2 diabetes mellitus with hyperglycemia: Secondary | ICD-10-CM

## 2022-09-21 MED ORDER — ACCU-CHEK GUIDE VI STRP: ORAL_STRIP | 3 refills | Status: DC

## 2022-09-21 NOTE — Progress Notes (Signed)
Established Patient Office Visit   Subjective:  Patient ID: Glenn Robertson, male    DOB: 11-27-1965  Age: 57 y.o. MRN: [id number]  Chief Complaint  Patient presents with   Follow-up    Follow up on diabetes, patient not fasting.     HPI Encounter Diagnoses  Name Primary?   Uncontrolled type 2 diabetes mellitus with hyperglycemia (HCC) Yes   Elevated cholesterol    For follow-up of uncontrolled type 2 diabetes.  He is compliant with glipizide 10 mg twice daily, metformin 1000 mg twice daily and Jardiance 25 mg daily.  He has decreased the carbohydrate intake especially sweets he drinks.  He is planning on restarting an exercise program by riding a bike.  Walking has been difficult for him secondary to sciatica.  He reports compliance with simvastatin 20 mg daily.  He has regular dental care.   Review of Systems  Constitutional: Negative.   HENT: Negative.    Eyes:  Negative for blurred vision, discharge and redness.  Respiratory: Negative.    Cardiovascular: Negative.   Gastrointestinal:  Negative for abdominal pain.  Genitourinary: Negative.   Musculoskeletal: Negative.  Negative for myalgias.  Skin:  Negative for rash.  Neurological:  Negative for tingling, loss of consciousness and weakness.  Endo/Heme/Allergies:  Negative for polydipsia.     Current Outpatient Medications:    ACCU-CHEK SOFTCLIX LANCETS lancets, 3 (three) times daily. for testing, Disp: , Rfl: 0   albuterol (VENTOLIN HFA) 108 (90 Base) MCG/ACT inhaler, Inhale 1-2 puffs into the lungs every 6 (six) hours as needed for wheezing or shortness of breath., Disp: 18 g, Rfl: 1   amantadine (SYMMETREL) 100 MG capsule, TAKE 1 CAPSULE(100 MG) BY MOUTH TWICE DAILY, Disp: 180 capsule, Rfl: 1   Cariprazine HCl (VRAYLAR) 6 MG CAPS, TAKE 1 CAPSULE(6 MG) BY MOUTH DAILY, Disp: 30 capsule, Rfl: 2   clonazePAM (KLONOPIN) 1 MG tablet, Take 1 tablet (1 mg total) by mouth 3 (three) times daily as needed for anxiety. for anxiety,  Disp: 90 tablet, Rfl: 2   cyanocobalamin (VITAMIN B12) 1000 MCG tablet, Take 1,000 mcg by mouth daily., Disp: , Rfl:    diphenhydrAMINE (BENADRYL) 25 mg capsule, Take 25 mg by mouth every 6 (six) hours as needed., Disp: , Rfl:    fexofenadine (ALLEGRA ODT) 30 MG disintegrating tablet, Take 30 mg by mouth daily., Disp: , Rfl:    fluPHENAZine (PROLIXIN) 5 MG tablet, Take one tablet ( 5 mgr)  at bedtime, Disp: 30 tablet, Rfl: 2   fluticasone (FLONASE) 50 MCG/ACT nasal spray, SHAKE LIQUID AND USE 2 SPRAYS IN EACH NOSTRIL DAILY, Disp: 16 g, Rfl: 6   glipiZIDE (GLUCOTROL) 10 MG tablet, TAKE 1 TABLET(10 MG) BY MOUTH TWICE DAILY, Disp: 180 tablet, Rfl: 3   JARDIANCE 25 MG TABS tablet, TAKE 1 TABLET BY MOUTH DAILY, Disp: 90 tablet, Rfl: 2   metFORMIN (GLUCOPHAGE-XR) 500 MG 24 hr tablet, TAKE 2 TABLETS(1000 MG) BY MOUTH TWICE DAILY, Disp: 360 tablet, Rfl: 3   montelukast (SINGULAIR) 10 MG tablet, TAKE 1 TABLET(10 MG) BY MOUTH AT BEDTIME, Disp: 90 tablet, Rfl: 3   RESTASIS 0.05 % ophthalmic emulsion, , Disp: , Rfl:    sertraline (ZOLOFT) 100 MG tablet, Take 1 tablet (100 mg total) by mouth daily., Disp: 30 tablet, Rfl: 2   simvastatin (ZOCOR) 20 MG tablet, TAKE 1 TABLET(20 MG) BY MOUTH EVERY EVENING, Disp: 90 tablet, Rfl: 3   tadalafil (CIALIS) 20 MG tablet, TAKE ONE-HALF TO ONE TABLET  BY MOUTH EVERY OTHER DAY AS NEEDED FOR ERECTILE DYSFUNCTION, Disp: 10 tablet, Rfl: 2   triamcinolone ointment (KENALOG) 0.1 %, Apply a thin coat to rash on elbows twice daily, Disp: 30 g, Rfl: 0   TYRVAYA 0.03 MG/ACT SOLN, Spray 1 spray in each nostril twice daily, approximately 12 hours apart, Disp: , Rfl:    glucose blood (ACCU-CHEK GUIDE) test strip, Test fingerstick blood sugars twice daily., Disp: 200 strip, Rfl: 3  Current Facility-Administered Medications:    methylPREDNISolone acetate (DEPO-MEDROL) injection 80 mg, 80 mg, Other, Once, Magnus Sinning, MD   Objective:     BP 120/68 (BP Location: Right Arm, Patient  Position: Sitting, Cuff Size: Normal)   Pulse 88   Temp (!) 97.4 F (36.3 C) (Temporal)   Ht '5\' 11"'$  (1.803 m)   Wt 198 lb 9.6 oz (90.1 kg)   SpO2 96%   BMI 27.70 kg/m  Wt Readings from Last 3 Encounters:  09/21/22 198 lb 9.6 oz (90.1 kg)  08/03/22 200 lb (90.7 kg)  07/29/22 200 lb (90.7 kg)      Physical Exam Constitutional:      General: He is not in acute distress.    Appearance: Normal appearance. He is not ill-appearing, toxic-appearing or diaphoretic.  HENT:     Head: Normocephalic and atraumatic.     Right Ear: External ear normal.     Left Ear: External ear normal.  Eyes:     General: No scleral icterus.       Right eye: No discharge.        Left eye: No discharge.     Extraocular Movements: Extraocular movements intact.     Conjunctiva/sclera: Conjunctivae normal.  Pulmonary:     Effort: Pulmonary effort is normal. No respiratory distress.  Skin:    General: Skin is warm and dry.  Neurological:     Mental Status: He is alert and oriented to person, place, and time.  Psychiatric:        Mood and Affect: Mood normal.        Behavior: Behavior normal.      No results found for any visits on 09/21/22.    The 10-year ASCVD risk score (Arnett DK, et al., 2019) is: 9.6%    Assessment & Plan:   Uncontrolled type 2 diabetes mellitus with hyperglycemia (HCC) -     Amb Referral to Nutrition and Diabetic Education -     Accu-Chek Guide; Test fingerstick blood sugars twice daily.  Dispense: 200 strip; Refill: 3  Elevated cholesterol    Return in about 3 months (around 12/21/2022).  Patient has lowered the carbohydrates in his diet.  He is no longer consuming sweets he drinks.  He is planning on exercising by riding his bike.  Agrees to go for diabetic teaching.  Advised that testing his blood sugars twice daily would be adequate for him.  Continue all medications as listed above.  Information was given on exercising to lose weight.  Could consider chronic care  management for this patient.  Libby Maw, MD

## 2022-09-23 ENCOUNTER — Ambulatory Visit (INDEPENDENT_AMBULATORY_CARE_PROVIDER_SITE_OTHER): Payer: Medicare Other | Admitting: Licensed Clinical Social Worker

## 2022-09-23 DIAGNOSIS — F251 Schizoaffective disorder, depressive type: Secondary | ICD-10-CM | POA: Diagnosis not present

## 2022-09-23 NOTE — Progress Notes (Signed)
Virtual Visit via Video Note  I connected with Glenn Robertson on 09/23/22 at 10:00 AM EST by a video enabled telemedicine application and verified that I am speaking with the correct person using two identifiers.  Location: Patient: home Provider: remote office [place], [name])   I discussed the limitations of evaluation and management by telemedicine and the availability of in person appointments. The patient expressed understanding and agreed to proceed.   I discussed the assessment and treatment plan with the patient. The patient was provided an opportunity to ask questions and all were answered. The patient agreed with the plan and demonstrated an understanding of the instructions.   The patient was advised to call back or seek an in-person evaluation if the symptoms worsen or if the condition fails to improve as anticipated.  I provided 40 minutes of non-face-to-face time during this encounter.   [place], LCSW   THERAPIST PROGRESS NOTE  Session Time: [id number]:[id number]  Participation Level: Active  Behavioral Response: Neat and Well GroomedAlertDepressed  Type of Therapy: Individual Therapy  Treatment Goals addressed: Develop healthy thinking patterns and beliefs about self, others, and the world that lead to the alleviation and help prevent the relapse of depression per pt report 3 out of 5 sessions documented  ProgressTowards Goals: Progressing  Interventions: CBT--identifying/challenging thought distortions  Summary: Glenn Robertson is a 57 y.o. male who presents with continuing symptoms related to schizoaffective disorder. Pt reports that he is compliant with his medication and is getting fair quality and quantity of sleep currently (pt recently injured his toe and the pain is impacting sleep quality).   Allowed pt to explore and express thoughts and feelings associated with recent life situations and external stressors.  Visited [place]--haven't been since 2017.    Discussed pt currently taking spanish class at 9am.  Have to be there at [place], park, etc.and be in class by 9. Discussed pros and cons. Pt states that he enjoys language classes and wants to take additional classes in the future.    Pt reports that he is continuing to have concerns about diabetes--pt states he managing it well. Discussed self management behaviors and allowed pt to explore.   Pt denies negative thoughts about self. "Negative thoughts about the liars" . Pt reports that he is setting limits with others. Pt still feels that people in [employer], [place], and [place] recognize him when he goes in the stores and "think bad about me". Pt states that "those people" still blame him for the suicide of someone from the past. Pt states that he has not actively spoken to any of those individuals in years.   After further discussion pt states that he was banned from [employer] and also was banned from [employer]. When asked to elaborate about why he was banned from the store, pt states that he was banned because he returned some shoes. Pt then started discussing about how someone from the store was "stalking me" and "got together with her friends and talked about me and about my friend that committed suicide".    Assisted pt in identifying "safe spaces"  [employer], [employer], [employer], [employer].   Eating behaviors and choices: pt reports that he is modifying his eating behaviors and making healthier food choices. Pt reports that he is limiting portion sizes.   Continued recommendations are as follows: self care behaviors, positive social engagements, focusing on overall work/home/life balance, and focusing on positive physical and emotional wellness.  Suicidal/Homicidal: No  Therapist Response: Pt is continuing to apply interventions learned in session into daily life situations. Pt is currently on track to meet goals utilizing interventions mentioned above. Personal growth  and progress noted. Treatment to continue as indicated.   Plan: Return again in 4 weeks.  Diagnosis:  Encounter Diagnosis  Name Primary?   Schizoaffective disorder, depressive type ([place]) Yes    Collaboration of Care: Other Pt encouraged to continue care with psychiatrist of record, Dr. Felicita Gage Cobos.   Patient/Guardian was advised Release of Information must be obtained prior to any record release in order to collaborate their care with an outside provider. Patient/Guardian was advised if they have not already done so to contact the registration department to sign all necessary forms in order for [place] to release information regarding their care.   Consent: Patient/Guardian gives verbal consent for treatment and assignment of benefits for services provided during this visit. Patient/Guardian expressed understanding and agreed to proceed.   [place], LCSW 09/23/2022

## 2022-09-26 ENCOUNTER — Telehealth: Payer: Self-pay | Admitting: Family Medicine

## 2022-09-26 ENCOUNTER — Other Ambulatory Visit: Payer: Self-pay

## 2022-09-26 ENCOUNTER — Telehealth: Payer: Self-pay | Admitting: Orthopaedic Surgery

## 2022-09-26 DIAGNOSIS — E1165 Type 2 diabetes mellitus with hyperglycemia: Secondary | ICD-10-CM

## 2022-09-26 MED ORDER — ACCU-CHEK GUIDE VI STRP: ORAL_STRIP | 3 refills | Status: DC

## 2022-09-26 NOTE — Telephone Encounter (Signed)
Patient called and said that his insurance wont cover his test strips for 2 a day and he just wanted sent in whatever will be covered

## 2022-09-26 NOTE — Telephone Encounter (Signed)
I called the pt. Pt wants to know how he would know if toe was healed. Said if barefoot toe will pop. Not having any pain when it pops. Please advise

## 2022-09-26 NOTE — Telephone Encounter (Signed)
Patient states he has questions about his toe. Please advise

## 2022-09-26 NOTE — Telephone Encounter (Signed)
Err

## 2022-09-26 NOTE — Telephone Encounter (Signed)
Rx changed to daily and pended to be sent.

## 2022-09-27 NOTE — Telephone Encounter (Signed)
Called and advised pt. He stated understanding  

## 2022-09-30 ENCOUNTER — Encounter: Payer: Self-pay | Admitting: Dietician

## 2022-09-30 ENCOUNTER — Encounter: Payer: Medicare Other | Attending: Family Medicine | Admitting: Dietician

## 2022-09-30 VITALS — Ht 72.0 in

## 2022-09-30 DIAGNOSIS — E1165 Type 2 diabetes mellitus with hyperglycemia: Secondary | ICD-10-CM | POA: Diagnosis not present

## 2022-09-30 NOTE — Progress Notes (Signed)
Diabetes Self-Management Education  Visit Type: First/Initial  Appt. Start Time: 0855 Appt. End Time: 1017  09/30/2022  [name], identified by name and date of birth, is a 57 y.o. male with a diagnosis of Diabetes: Type 2.   ASSESSMENT  Primary concern: pt wants to bring his A1c down.   History includes: allergies, anxiety, asthma, depression, type 2 diabetes, glaucoma.  Labs noted: 07/26/22 A1c 8.8% Medications include: glipizide, jardiance, metformin Supplements:  Pt states he started exercising yesterday to try to get back on track because he couldn't previously due to a broken toe.  Pt states he used to enjoy running and walking but his sciatica has prevented him from being able to do this so he has started biking.   Pt states he was drinking sodas and eating cakes every day a year ago but has reduced this.   Pt states he gets low blood sugar ('70mg'$ /dL, '50mg'$ /dL) and usually drinks soda to bring it back up.   Pt states sometimes he eats a 1lb bag of cashews during the day and he will eat junk food if he skips meals.   Pt reports he does not know how to cook so he usually makes simple meals at home including microwave dinners, canned vegetables, baked beans, and sandwiches.   Height 6' (1.829 m). Body mass index is 26.94 kg/m.   Diabetes Self-Management Education - 09/30/22 0859       Visit Information   Visit Type First/Initial      Initial Visit   Diabetes Type Type 2    Date Diagnosed 2002    Are you currently following a meal plan? No    Are you taking your medications as prescribed? Yes      Health Coping   How would you rate your overall health? Good      Psychosocial Assessment   Patient Belief/Attitude about Diabetes Defeat/Burnout    What is the hardest part about your diabetes right now, causing you the most concern, or is the most worrisome to you about your diabetes?   Making healty food and beverage choices    Self-care barriers None     Self-management support Doctor's office    Other persons present Patient    Patient Concerns Healthy Lifestyle    Special Needs None    Preferred Learning Style No preference indicated    Learning Readiness Ready    How often do you need to have someone help you when you read instructions, pamphlets, or other written materials from your doctor or pharmacy? 1 - Never    What is the last grade level you completed in school? 12th      Pre-Education Assessment   Patient understands the diabetes disease and treatment process. Needs Instruction    Patient understands incorporating nutritional management into lifestyle. Needs Instruction    Patient undertands incorporating physical activity into lifestyle. Needs Instruction    Patient understands using medications safely. Needs Instruction    Patient understands monitoring blood glucose, interpreting and using results Needs Instruction    Patient understands prevention, detection, and treatment of acute complications. Needs Instruction    Patient understands prevention, detection, and treatment of chronic complications. Needs Instruction    Patient understands how to develop strategies to address psychosocial issues. Needs Instruction    Patient understands how to develop strategies to promote health/change behavior. Needs Instruction      Complications   Last HgB A1C per patient/outside source 8.8 %    How  often do you check your blood sugar? 3-4 times/day    Fasting Blood glucose range (mg/dL) 70-129    Have you had a dilated eye exam in the past 12 months? Yes    Have you had a dental exam in the past 12 months? Yes    Are you checking your feet? Yes    How many days per week are you checking your feet? 6      Dietary Intake   Breakfast 7am: english muffin with butter and jelly and deli chicken on 1 slice bread and 1/2 c oatmeal    Snack (morning) none    Lunch 12pm: oatmeal cream pie and deli meat on 1 slice bread and fruit    Snack  (afternoon) none OR crackers and carrots    Dinner frozen dinner OR k&w meat and greens OR cookout 2 burger patties no bun    Snack (evening) none OR crackers OR carrots    Beverage(s) water, occasional soda,      Activity / Exercise   Activity / Exercise Type Light (walking / raking leaves);ADL's    How many days per week do you exercise? 1    How many minutes per day do you exercise? 30    Total minutes per week of exercise 30      Patient Education   Previous Diabetes Education Yes (please comment)   2010   Disease Pathophysiology Definition of diabetes, type 1 and 2, and the diagnosis of diabetes;Factors that contribute to the development of diabetes;Explored patient's options for treatment of their diabetes    Healthy Eating Role of diet in the treatment of diabetes and the relationship between the three main macronutrients and blood glucose level;Plate Method;Carbohydrate counting;Reviewed blood glucose goals for pre and post meals and how to evaluate the patients' food intake on their blood glucose level.;Meal timing in regards to the patients' current diabetes medication.;Information on hints to eating out and maintain blood glucose control.;Meal options for control of blood glucose level and chronic complications.    Being Active Role of exercise on diabetes management, blood pressure control and cardiac health.;Helped patient identify appropriate exercises in relation to his/her diabetes, diabetes complications and other health issue.    Medications Reviewed patients medication for diabetes, action, purpose, timing of dose and side effects.;Reviewed medication adjustment guidelines for hyperglycemia and sick days.    Monitoring Identified appropriate SMBG and/or A1C goals.;Daily foot exams;Yearly dilated eye exam    Acute complications Taught prevention, symptoms, and  treatment of hypoglycemia - the 15 rule.;Discussed and identified patients' prevention, symptoms, and treatment of  hyperglycemia.    Chronic complications Relationship between chronic complications and blood glucose control;Assessed and discussed foot care and prevention of foot problems;Lipid levels, blood glucose control and heart disease;Identified and discussed with patient  current chronic complications;Reviewed with patient heart disease, higher risk of, and prevention    Diabetes Stress and Support Identified and addressed patients feelings and concerns about diabetes;Worked with patient to identify barriers to care and solutions;Role of stress on diabetes    Lifestyle and Health Coping Lifestyle issues that need to be addressed for better diabetes care      Individualized Goals (developed by patient)   Nutrition General guidelines for healthy choices and portions discussed    Physical Activity Exercise 3-5 times per week;30 minutes per day    Medications take my medication as prescribed    Monitoring  Test my blood glucose as discussed    Problem Solving Eating Pattern  Reducing Risk examine blood glucose patterns;do foot checks daily;treat hypoglycemia with 15 grams of carbs if blood glucose less than '70mg'$ /dL    Health Coping Ask for help with psychological, social, or emotional issues      Post-Education Assessment   Patient understands the diabetes disease and treatment process. Comprehends key points    Patient understands incorporating nutritional management into lifestyle. Comprehends key points    Patient undertands incorporating physical activity into lifestyle. Comprehends key points    Patient understands using medications safely. Comphrehends key points    Patient understands monitoring blood glucose, interpreting and using results Comprehends key points    Patient understands prevention, detection, and treatment of acute complications. Comprehends key points    Patient understands prevention, detection, and treatment of chronic complications. Comprehends key points    Patient  understands how to develop strategies to address psychosocial issues. Comprehends key points    Patient understands how to develop strategies to promote health/change behavior. Comprehends key points      Outcomes   Expected Outcomes Demonstrated interest in learning. Expect positive outcomes    Future DMSE PRN    Program Status Completed             Individualized Plan for Diabetes Self-Management Training:   Learning Objective:  Patient will have a greater understanding of diabetes self-management. Patient education plan is to attend individual and/or group sessions per assessed needs and concerns.   Plan:   Patient Instructions  Aim for 150 minutes of physical activity weekly. Goal: Exercise 4 days per week for at least 30 minutes  Goal: aim to eat 3 consistent meals per day (1/4 plate protein, 1/4 plate complex carbs, 1/2 plate vegetables)  When you're going out to run errands make sure to pack a balanced snack (carbohydrate + protein) (see snack sheet)  Keep a food log for 1 week and bring it back next time.   Expected Outcomes:  Demonstrated interest in learning. Expect positive outcomes  Education material provided: ADA - How to Thrive: A Guide for Your Journey with Diabetes and Snack sheet, Meal Ideas  If problems or questions, patient to contact team via:  Phone  Future DSME appointment: PRN

## 2022-09-30 NOTE — Patient Instructions (Addendum)
Aim for 150 minutes of physical activity weekly. Goal: Exercise 4 days per week for at least 30 minutes  Goal: aim to eat 3 consistent meals per day (1/4 plate protein, 1/4 plate complex carbs, 1/2 plate vegetables)  When you're going out to run errands make sure to pack a balanced snack (carbohydrate + protein) (see snack sheet)  Keep a food log for 1 week and bring it back next time.

## 2022-10-03 ENCOUNTER — Ambulatory Visit (INDEPENDENT_AMBULATORY_CARE_PROVIDER_SITE_OTHER): Payer: Medicare Other

## 2022-10-03 VITALS — BP 110/50 | HR 88 | Temp 97.6°F | Ht 69.5 in | Wt 196.8 lb

## 2022-10-03 DIAGNOSIS — Z0000 Encounter for general adult medical examination without abnormal findings: Secondary | ICD-10-CM

## 2022-10-03 NOTE — Progress Notes (Signed)
Subjective:   Glenn Robertson is a 57 y.o. male who presents for Medicare Annual/Subsequent preventive examination.  Review of Systems     Cardiac Risk Factors include: advanced age (>64mn, >>76women);diabetes mellitus;male gender     Objective:    Today's Vitals   10/03/22 0946 10/03/22 0953  BP: (!) 110/50   Pulse: 88   Temp: 97.6 F (36.4 C)   TempSrc: Oral   SpO2: 95%   Weight: 196 lb 12.8 oz (89.3 kg)   Height: 5' 9.5" (1.765 m)   PainSc:  3    Body mass index is 28.65 kg/m.     10/03/2022   10:00 AM 09/30/2022    8:59 AM 06/25/2022    2:31 PM 01/06/2022   10:25 AM 09/21/2021    8:30 AM 06/13/2021   12:14 PM 02/26/2020    9:05 AM  Advanced Directives  Does Patient Have a Medical Advance Directive? No No No No No No No  Would patient like information on creating a medical advance directive? No - Patient declined No - Patient declined No - Patient declined No - Patient declined No - Patient declined Yes (ED - Information included in AVS) Yes (ED - Information included in AVS)    Current Medications (verified) Outpatient Encounter Medications as of 10/03/2022  Medication Sig   ACCU-CHEK SOFTCLIX LANCETS lancets 3 (three) times daily. for testing   albuterol (VENTOLIN HFA) 108 (90 Base) MCG/ACT inhaler Inhale 1-2 puffs into the lungs every 6 (six) hours as needed for wheezing or shortness of breath.   amantadine (SYMMETREL) 100 MG capsule TAKE 1 CAPSULE(100 MG) BY MOUTH TWICE DAILY   Cariprazine HCl (VRAYLAR) 6 MG CAPS TAKE 1 CAPSULE(6 MG) BY MOUTH DAILY   clonazePAM (KLONOPIN) 1 MG tablet Take 1 tablet (1 mg total) by mouth 3 (three) times daily as needed for anxiety. for anxiety   cyanocobalamin (VITAMIN B12) 1000 MCG tablet Take 1,000 mcg by mouth daily.   fexofenadine (ALLEGRA ODT) 30 MG disintegrating tablet Take 30 mg by mouth daily.   fluPHENAZine (PROLIXIN) 5 MG tablet Take one tablet ( 5 mgr)  at bedtime   fluticasone (FLONASE) 50 MCG/ACT nasal spray SHAKE  LIQUID AND USE 2 SPRAYS IN EACH NOSTRIL DAILY   glipiZIDE (GLUCOTROL) 10 MG tablet TAKE 1 TABLET(10 MG) BY MOUTH TWICE DAILY   glucose blood (ACCU-CHEK GUIDE) test strip Test fingerstick blood sugars daily.   JARDIANCE 25 MG TABS tablet TAKE 1 TABLET BY MOUTH DAILY   metFORMIN (GLUCOPHAGE-XR) 500 MG 24 hr tablet TAKE 2 TABLETS(1000 MG) BY MOUTH TWICE DAILY   montelukast (SINGULAIR) 10 MG tablet TAKE 1 TABLET(10 MG) BY MOUTH AT BEDTIME   sertraline (ZOLOFT) 100 MG tablet Take 1 tablet (100 mg total) by mouth daily.   simvastatin (ZOCOR) 20 MG tablet TAKE 1 TABLET(20 MG) BY MOUTH EVERY EVENING   tadalafil (CIALIS) 20 MG tablet TAKE ONE-HALF TO ONE TABLET BY MOUTH EVERY OTHER DAY AS NEEDED FOR ERECTILE DYSFUNCTION   triamcinolone ointment (KENALOG) 0.1 % Apply a thin coat to rash on elbows twice daily   TYRVAYA 0.03 MG/ACT SOLN Spray 1 spray in each nostril twice daily, approximately 12 hours apart   diphenhydrAMINE (BENADRYL) 25 mg capsule Take 25 mg by mouth every 6 (six) hours as needed. (Patient not taking: Reported on 10/03/2022)   RESTASIS 0.05 % ophthalmic emulsion  (Patient not taking: Reported on 10/03/2022)   [DISCONTINUED] pregabalin (LYRICA) 100 MG capsule Take 100 mg by mouth daily.  Facility-Administered Encounter Medications as of 10/03/2022  Medication   methylPREDNISolone acetate (DEPO-MEDROL) injection 80 mg    Allergies (verified) Cat hair extract, Citric acid, Food, and Gabapentin   History: Past Medical History:  Diagnosis Date   Allergy    dog and cats, citrus   Anxiety    Asthma    Chronic pain    Depression    Diabetes mellitus    Glaucoma    Neuromuscular disorder ([place])    Paranoid schizophrenia ([place])    Past Surgical History:  Procedure Laterality Date   ANKLE ARTHROSCOPY     Arm Surgery  1/12   rt ulnar nerve decompression   EPIDURAL BLOCK INJECTION     several   ULNAR NERVE TRANSPOSITION  01/19/2012   Procedure: ULNAR NERVE DECOMPRESSION/TRANSPOSITION;   Surgeon: Cammie Sickle., MD;  Location: [place];  Service: Orthopedics;  Laterality: Left;  ulnar nerve decompression vs transposition left elbow   Family History  Problem Relation Age of Onset   Diabetes Father    Diabetes Paternal Uncle    Colon cancer Neg Hx    Colon polyps Neg Hx    Esophageal cancer Neg Hx    Rectal cancer Neg Hx    Stomach cancer Neg Hx    Social History   Socioeconomic History   Marital status: Single    Spouse name: Not on file   Number of children: 0   Years of education: BA   Highest education level: Not on file  Occupational History   Occupation: Unemlpoyed-disabled  Tobacco Use   Smoking status: Never   Smokeless tobacco: Never  Vaping Use   Vaping Use: Never used  Substance and Sexual Activity   Alcohol use: No   Drug use: No   Sexual activity: Yes  Other Topics Concern   Not on file  Social History Narrative   Lives with mother   Caffeine use: Coke very rare   Right handed    Social Determinants of Health   Financial Resource Strain: Low Risk  (10/03/2022)   Overall Financial Resource Strain (CARDIA)    Difficulty of Paying Living Expenses: Not hard at all  Food Insecurity: No Food Insecurity (10/03/2022)   Hunger Vital Sign    Worried About Running Out of Food in the Last Year: Never true    Ran Out of Food in the Last Year: Never true  Transportation Needs: No Transportation Needs (10/03/2022)   PRAPARE - Hydrologist (Medical): No    Lack of Transportation (Non-Medical): No  Physical Activity: Insufficiently Active (10/03/2022)   Exercise Vital Sign    Days of Exercise per Week: 4 days    Minutes of Exercise per Session: 30 min  Stress: Stress Concern Present (10/03/2022)   [place]    Feeling of Stress : To some extent  Social Connections: Socially Isolated (09/21/2021)   Social Connection and Isolation Panel [NHANES]     Frequency of Communication with Friends and Family: Three times a week    Frequency of Social Gatherings with Friends and Family: Three times a week    Attends Religious Services: Never    Active Member of Clubs or Organizations: No    Attends Music therapist: Not on file    Marital Status: Never married    Tobacco Counseling Counseling given: Not Answered   Clinical Intake:  Pre-visit preparation completed: Yes  Pain :  0-10 Pain Score: 3  Pain Type: Chronic pain Pain Location: Elbow Pain Orientation: Left Pain Descriptors / Indicators: Aching Pain Onset: More than a month ago Pain Frequency: Constant     Nutritional Status: BMI 25 -29 Overweight Nutritional Risks: None Diabetes: Yes  How often do you need to have someone help you when you read instructions, pamphlets, or other written materials from your doctor or pharmacy?: 1 - Never  Diabetic? Yes Nutrition Risk Assessment:  Has the patient had any N/V/D within the last 2 months?  No  Does the patient have any non-healing wounds?  No  Has the patient had any unintentional weight loss or weight gain?  No   Diabetes:  Is the patient diabetic?  Yes  If diabetic, was a CBG obtained today?  No  Did the patient bring in their glucometer from home?  No  How often do you monitor your CBG's? daily.   Financial Strains and Diabetes Management:  Are you having any financial strains with the device, your supplies or your medication? No .  Does the patient want to be seen by Chronic Care Management for management of their diabetes?  No  Would the patient like to be referred to a Nutritionist or for Diabetic Management?  No   Diabetic Exams:  Diabetic Eye Exam: Completed 12/17/2021 Diabetic Foot Exam: Overdue, Pt has been advised about the importance in completing this exam. Pt is scheduled for diabetic foot exam on next appointment.   Interpreter Needed?: No  Information entered by :: NAllen  LPN   Activities of Daily Living    10/03/2022   10:01 AM  In your present state of health, do you have any difficulty performing the following activities:  Hearing? 0  Vision? 0  Difficulty concentrating or making decisions? 1  Comment trouble concentrating  Walking or climbing stairs? 0  Dressing or bathing? 0  Doing errands, shopping? 0  Preparing Food and eating ? N  Using the Toilet? N  In the past six months, have you accidently leaked urine? Y  Do you have problems with loss of bowel control? N  Managing your Medications? N  Managing your Finances? N  Housekeeping or managing your Housekeeping? N    Patient Care Team: Libby Maw, MD as PCP - General (Family Medicine)  Indicate any recent Medical Services you may have received from other than [facility] providers in the past year (date may be approximate).     Assessment:   This is a routine wellness examination for [place].  Hearing/Vision screen Vision Screening - Comments:: Regular eye exams,  Dietary issues and exercise activities discussed: Current Exercise Habits: Home exercise routine, Type of exercise: Other - see comments (biking), Time (Minutes): 30, Frequency (Times/Week): 4, Weekly Exercise (Minutes/Week): 120   Goals Addressed             This Visit's Progress    Patient Stated       10/03/2022, wants to lose weight       Depression Screen    10/03/2022   10:00 AM 09/30/2022    8:59 AM 09/21/2022    8:05 AM 07/26/2022    9:06 AM 07/19/2022    8:42 AM 07/05/2022    9:39 AM 05/16/2022    9:36 AM  PHQ 2/9 Scores  PHQ - 2 Score 0 0 0 2 0 3   PHQ- 9 Score    7        Information is confidential and restricted. Go  to Review Flowsheets to unlock data.    Fall Risk    10/03/2022   10:00 AM 09/30/2022    8:59 AM 09/21/2022    8:06 AM 07/26/2022    8:41 AM 07/19/2022    8:42 AM  Fall Risk   Falls in the past year? 0 0 0 1 0  Number falls in past yr: 0  0 0 0  Injury with Fall? 0  0 0    Risk for fall due to : Medication side effect  No Fall Risks    Follow up Falls prevention discussed;Education provided;Falls evaluation completed  Falls evaluation completed      FALL RISK PREVENTION PERTAINING TO THE HOME:  Any stairs in or around the home? No  If so, are there any without handrails? N/a Home free of loose throw rugs in walkways, pet beds, electrical cords, etc? Yes  Adequate lighting in your home to reduce risk of falls? Yes   ASSISTIVE DEVICES UTILIZED TO PREVENT FALLS:  Life alert? No  Use of a cane, walker or w/c? No  Grab bars in the bathroom? No  Shower chair or bench in shower? No  Elevated toilet seat or a handicapped toilet? No   TIMED UP AND GO:  Was the test performed? Yes .  Length of time to ambulate 10 feet: 5 sec.   Gait steady and fast without use of assistive device  Cognitive Function:        10/03/2022   10:03 AM 02/26/2020    9:17 AM  6CIT Screen  What Year? 0 points 0 points  What month? 0 points 0 points  What time? 0 points 0 points  Count back from 20 0 points 0 points  Months in reverse 0 points 0 points  Repeat phrase 4 points 0 points  Total Score 4 points 0 points    Immunizations Immunization History  Administered Date(s) Administered   Influenza Inj Mdck Quad With Preservative 06/09/2018   Influenza Whole 06/27/2022   Influenza,inj,Quad PF,6+ Mos 05/19/2017, 04/25/2019   Influenza-Unspecified 06/09/2018, 06/07/2021   PFIZER(Purple Top)SARS-COV-2 Vaccination 11/12/2019, 12/04/2019, 07/02/2020   Pneumococcal Conjugate-13 03/23/2017   Sinovac SARS-COV-2(Non [place]) 06/27/2022   Tdap 06/28/2019   Zoster Recombinat (Shingrix) 03/09/2017, 05/22/2017    TDAP status: Up to date  Flu Vaccine status: Up to date  Pneumococcal vaccine status: Up to date  Covid-19 vaccine status: Completed vaccines  Qualifies for Shingles Vaccine? Yes   Zostavax completed Yes   Shingrix Completed?: Yes  Screening Tests Health  Maintenance  Topic Date Due   FOOT EXAM  07/15/2022   OPHTHALMOLOGY EXAM  12/18/2022   HEMOGLOBIN A1C  01/24/2023   Diabetic kidney evaluation - eGFR measurement  07/27/2023   Diabetic kidney evaluation - Urine ACR  07/27/2023   Medicare Annual Wellness (AWV)  10/04/2023   DTaP/Tdap/Td (2 - Td or Tdap) 06/27/2029   COLONOSCOPY (Pts 45-47yr Insurance coverage will need to be confirmed)  09/03/2029   INFLUENZA VACCINE  Completed   Hepatitis C Screening  Completed   HIV Screening  Completed   Zoster Vaccines- Shingrix  Completed   HPV VACCINES  Aged Out   COVID-19 Vaccine  Discontinued    Health Maintenance  Health Maintenance Due  Topic Date Due   FOOT EXAM  07/15/2022    Colorectal cancer screening: Type of screening: Colonoscopy. Completed 09/04/2019. Repeat every 10 years  Lung Cancer Screening: (Low Dose CT Chest recommended if Age 56-80years, 30 pack-year currently smoking OR  have quit w/in 15years.) does not qualify.   Lung Cancer Screening Referral: no  Additional Screening:  Hepatitis C Screening: does qualify; Completed 04/06/2020  Vision Screening: Recommended annual ophthalmology exams for early detection of glaucoma and other disorders of the eye. Is the patient up to date with their annual eye exam?  Yes  Who is the provider or what is the name of the office in which the patient attends annual eye exams? Can't remember name If pt is not established with a provider, would they like to be referred to a provider to establish care? No .   Dental Screening: Recommended annual dental exams for proper oral hygiene  Community Resource Referral / Chronic Care Management: CRR required this visit?  No   CCM required this visit?  No      Plan:     I have personally reviewed and noted the following in the patient's chart:   Medical and social history Use of alcohol, tobacco or illicit drugs  Current medications and supplements including opioid prescriptions.  Patient is not currently taking opioid prescriptions. Functional ability and status Nutritional status Physical activity Advanced directives List of other physicians Hospitalizations, surgeries, and ER visits in previous 12 months Vitals Screenings to include cognitive, depression, and falls Referrals and appointments  In addition, I have reviewed and discussed with patient certain preventive protocols, quality metrics, and best practice recommendations. A written personalized care plan for preventive services as well as general preventive health recommendations were provided to patient.     Kellie Simmering, LPN   7/0/1779   Nurse Notes: none

## 2022-10-03 NOTE — Patient Instructions (Signed)
[name] , Thank you for taking time to come for your Medicare Wellness Visit. I appreciate your ongoing commitment to your health goals. Please review the following plan we discussed and let me know if I can assist you in the future.   These are the goals we discussed:  Goals      Patient Stated     Increase exercise & lose weight     Patient Stated     10/03/2022, wants to lose weight        This is a list of the screening recommended for you and due dates:  Health Maintenance  Topic Date Due   Complete foot exam   07/15/2022   Eye exam for diabetics  12/18/2022   Hemoglobin A1C  01/24/2023   Yearly kidney function blood test for diabetes  07/27/2023   Yearly kidney health urinalysis for diabetes  07/27/2023   Medicare Annual Wellness Visit  10/04/2023   DTaP/Tdap/Td vaccine (2 - Td or Tdap) 06/27/2029   Colon Cancer Screening  09/03/2029   Flu Shot  Completed   Hepatitis C Screening: USPSTF Recommendation to screen - Ages 18-79 yo.  Completed   HIV Screening  Completed   Zoster (Shingles) Vaccine  Completed   HPV Vaccine  Aged Out   COVID-19 Vaccine  Discontinued    Advanced directives: Advance directive discussed with you today. Even though you declined this today please call our office should you change your mind and we can give you the proper paperwork for you to fill out.  Conditions/risks identified: none  Next appointment: Follow up in one year for your annual wellness visit   Preventive Care 40-64 Years, Male Preventive care refers to lifestyle choices and visits with your health care provider that can promote health and wellness. What does preventive care include? A yearly physical exam. This is also called an annual well check. Dental exams once or twice a year. Routine eye exams. Ask your health care provider how often you should have your eyes checked. Personal lifestyle choices, including: Daily care of your teeth and gums. Regular physical  activity. Eating a healthy diet. Avoiding tobacco and drug use. Limiting alcohol use. Practicing safe sex. Taking low-dose aspirin every day starting at age 75. What happens during an annual well check? The services and screenings done by your health care provider during your annual well check will depend on your age, overall health, lifestyle risk factors, and family history of disease. Counseling  Your health care provider may ask you questions about your: Alcohol use. Tobacco use. Drug use. Emotional well-being. Home and relationship well-being. Sexual activity. Eating habits. Work and work Statistician. Screening  You may have the following tests or measurements: Height, weight, and BMI. Blood pressure. Lipid and cholesterol levels. These may be checked every 5 years, or more frequently if you are over 45 years old. Skin check. Lung cancer screening. You may have this screening every year starting at age 67 if you have a 30-pack-year history of smoking and currently smoke or have quit within the past 15 years. Fecal occult blood test (FOBT) of the stool. You may have this test every year starting at age 82. Flexible sigmoidoscopy or colonoscopy. You may have a sigmoidoscopy every 5 years or a colonoscopy every 10 years starting at age 75. Prostate cancer screening. Recommendations will vary depending on your family history and other risks. Hepatitis C blood test. Hepatitis B blood test. Sexually transmitted disease (STD) testing. Diabetes screening. This is  done by checking your blood sugar (glucose) after you have not eaten for a while (fasting). You may have this done every 1-3 years. Discuss your test results, treatment options, and if necessary, the need for more tests with your health care provider. Vaccines  Your health care provider may recommend certain vaccines, such as: Influenza vaccine. This is recommended every year. Tetanus, diphtheria, and acellular pertussis  (Tdap, Td) vaccine. You may need a Td booster every 10 years. Zoster vaccine. You may need this after age 65. Pneumococcal 13-valent conjugate (PCV13) vaccine. You may need this if you have certain conditions and have not been vaccinated. Pneumococcal polysaccharide (PPSV23) vaccine. You may need one or two doses if you smoke cigarettes or if you have certain conditions. Talk to your health care provider about which screenings and vaccines you need and how often you need them. This information is not intended to replace advice given to you by your health care provider. Make sure you discuss any questions you have with your health care provider. Document Released: 09/11/2015 Document Revised: 05/04/2016 Document Reviewed: 06/16/2015 Elsevier Interactive Patient Education  2017 [place] Prevention in the Home Falls can cause injuries. They can happen to people of all ages. There are many things you can do to make your home safe and to help prevent falls. What can I do on the outside of my home? Regularly fix the edges of walkways and driveways and fix any cracks. Remove anything that might make you trip as you walk through a door, such as a raised step or threshold. Trim any bushes or trees on the path to your home. Use bright outdoor lighting. Clear any walking paths of anything that might make someone trip, such as rocks or tools. Regularly check to see if handrails are loose or broken. Make sure that both sides of any steps have handrails. Any raised decks and porches should have guardrails on the edges. Have any leaves, snow, or ice cleared regularly. Use sand or salt on walking paths during winter. Clean up any spills in your garage right away. This includes oil or grease spills. What can I do in the bathroom? Use night lights. Install grab bars by the toilet and in the tub and shower. Do not use towel bars as grab bars. Use non-skid mats or decals in the tub or shower. If  you need to sit down in the shower, use a plastic, non-slip stool. Keep the floor dry. Clean up any water that spills on the floor as soon as it happens. Remove soap buildup in the tub or shower regularly. Attach bath mats securely with double-sided non-slip rug tape. Do not have throw rugs and other things on the floor that can make you trip. What can I do in the bedroom? Use night lights. Make sure that you have a light by your bed that is easy to reach. Do not use any sheets or blankets that are too big for your bed. They should not hang down onto the floor. Have a firm chair that has side arms. You can use this for support while you get dressed. Do not have throw rugs and other things on the floor that can make you trip. What can I do in the kitchen? Clean up any spills right away. Avoid walking on wet floors. Keep items that you use a lot in easy-to-reach places. If you need to reach something above you, use a strong step stool that has a grab bar. Keep  electrical cords out of the way. Do not use floor polish or wax that makes floors slippery. If you must use wax, use non-skid floor wax. Do not have throw rugs and other things on the floor that can make you trip. What can I do with my stairs? Do not leave any items on the stairs. Make sure that there are handrails on both sides of the stairs and use them. Fix handrails that are broken or loose. Make sure that handrails are as long as the stairways. Check any carpeting to make sure that it is firmly attached to the stairs. Fix any carpet that is loose or worn. Avoid having throw rugs at the top or bottom of the stairs. If you do have throw rugs, attach them to the floor with carpet tape. Make sure that you have a light switch at the top of the stairs and the bottom of the stairs. If you do not have them, ask someone to add them for you. What else can I do to help prevent falls? Wear shoes that: Do not have high heels. Have rubber  bottoms. Are comfortable and fit you well. Are closed at the toe. Do not wear sandals. If you use a stepladder: Make sure that it is fully opened. Do not climb a closed stepladder. Make sure that both sides of the stepladder are locked into place. Ask someone to hold it for you, if possible. Clearly mark and make sure that you can see: Any grab bars or handrails. First and last steps. Where the edge of each step is. Use tools that help you move around (mobility aids) if they are needed. These include: Canes. Walkers. Scooters. Crutches. Turn on the lights when you go into a dark area. Replace any light bulbs as soon as they burn out. Set up your furniture so you have a clear path. Avoid moving your furniture around. If any of your floors are uneven, fix them. If there are any pets around you, be aware of where they are. Review your medicines with your doctor. Some medicines can make you feel dizzy. This can increase your chance of falling. Ask your doctor what other things that you can do to help prevent falls. This information is not intended to replace advice given to you by your health care provider. Make sure you discuss any questions you have with your health care provider. Document Released: 06/11/2009 Document Revised: 01/21/2016 Document Reviewed: 09/19/2014 Elsevier Interactive Patient Education  2017 [employer].

## 2022-10-05 ENCOUNTER — Other Ambulatory Visit: Payer: Self-pay

## 2022-10-05 DIAGNOSIS — E1165 Type 2 diabetes mellitus with hyperglycemia: Secondary | ICD-10-CM

## 2022-10-05 MED ORDER — ACCU-CHEK GUIDE VI STRP: 1.0000 | ORAL_STRIP | Freq: Three times a day (TID) | 3 refills | Status: DC

## 2022-10-05 MED ORDER — ACCU-CHEK GUIDE VI STRP: 1.0000 | ORAL_STRIP | Freq: Two times a day (BID) | 3 refills | Status: DC

## 2022-10-12 ENCOUNTER — Telehealth: Payer: Self-pay | Admitting: Family Medicine

## 2022-10-12 NOTE — Telephone Encounter (Signed)
Form faxed today patient aware

## 2022-10-12 NOTE — Telephone Encounter (Signed)
Pt states he still hasn't gotten his test strips.

## 2022-10-14 ENCOUNTER — Encounter: Payer: Self-pay | Admitting: Family Medicine

## 2022-10-18 ENCOUNTER — Telehealth (HOSPITAL_COMMUNITY): Payer: Medicare Other | Admitting: Psychiatry

## 2022-10-20 ENCOUNTER — Telehealth (HOSPITAL_BASED_OUTPATIENT_CLINIC_OR_DEPARTMENT_OTHER): Payer: Medicare Other | Admitting: Psychiatry

## 2022-10-20 ENCOUNTER — Encounter (HOSPITAL_COMMUNITY): Payer: Self-pay | Admitting: Psychiatry

## 2022-10-20 ENCOUNTER — Telehealth: Payer: Self-pay | Admitting: Family Medicine

## 2022-10-20 VITALS — BP 128/81 | HR 80 | Resp 18 | Ht 69.5 in | Wt 198.6 lb

## 2022-10-20 DIAGNOSIS — R404 Transient alteration of awareness: Secondary | ICD-10-CM

## 2022-10-20 DIAGNOSIS — F200 Paranoid schizophrenia: Secondary | ICD-10-CM | POA: Diagnosis not present

## 2022-10-20 MED ORDER — AMANTADINE HCL 100 MG PO CAPS: ORAL_CAPSULE | ORAL | 1 refills | Status: DC

## 2022-10-20 MED ORDER — CLONAZEPAM 1 MG PO TABS: 1.0000 mg | ORAL_TABLET | Freq: Three times a day (TID) | ORAL | 2 refills | Status: DC | PRN

## 2022-10-20 MED ORDER — FLUPHENAZINE HCL 5 MG PO TABS: ORAL_TABLET | ORAL | 2 refills | Status: DC

## 2022-10-20 MED ORDER — VRAYLAR 6 MG PO CAPS: ORAL_CAPSULE | ORAL | 2 refills | Status: DC

## 2022-10-20 MED ORDER — SERTRALINE HCL 100 MG PO TABS: 100.0000 mg | ORAL_TABLET | Freq: Every day | ORAL | 2 refills | Status: DC

## 2022-10-20 NOTE — Telephone Encounter (Signed)
Patient dropped off document  for a prescription , to be filled out by provider. Patient requested to send it via Call Patient to pick up within 5-days Pt's # [phone]. Document is located in providers tray at front office.

## 2022-10-20 NOTE — Progress Notes (Signed)
Patient ID: Glenn Robertson, male   DOB: 1966-08-21, 57 y.o.   MRN: [id number]  .BH MD/PA/NP OP Progress Note 10/20/2022  LINUX EICHER  MRN:  [id number]   Chief Complaint:  Patient returns for medication management  This was a face-to-face appointment in office   Duration 20 minutes   HPI: 57yo male, who has been diagnosed with schizoaffective disorder. On disability. Lives with mother . He was being followed by Dr . Montel Culver, who has left the clinic, and I am providing follow ups /bridging until new provider starts .    He lives with his elderly mother, helps her in daily activities. Overall [name] reports that he has been doing "all right". He has a history of chronic paranoid ideations , consisting of feeling that he is being monitored, followed, sometimes seen suspicious persons or vehicles).  These paranoid ideations have been present for many years. He states that recently these issues have not been bothering him as I did in the past and states he has "not seen anyone suspicious recently" He recently spent some time in [place] where he had lived  in the past.  There have been some concern that this change in geographical location might have contributed to increased paranoia but he describes not experiencing significant increase in paranoid ideations while in regards to being in a large city environment/[place].  With regards to mood he states "it has been all right".  Does not currently endorse significant depression or anhedonia.  He continues to have interest in learning other languages and a photography.  Noted to be reading a book [place] grammar when he came into the office   He has a history of diabetes mellitus.  Hemoglobin A1c updated (8.8) late last year.  He reports he has made efforts to be more active and make healthy food choices.  We reviewed checking hemoglobin A1c.  His preference is to follow-up with his PCP.  He states he is testing his glycemias more  frequently and recently contacted PCP clinic /requested refill of glucometer strips  He reports a very subjective feeling of being restless.  No overt akathisia was noted today and he was noted to be able to sit comfortably throughout visit.  No increased physical activity or restlessness noted.  No abnormal involuntary movements noted, he has a subtle/minimal tremor on left hand but it appears very mild at this time.  No abnormal or buccal movements, facial movements, truncal movements are noted.  He reports he is taking medications as prescribed and does not endorse side effects.  He has been on on the same regimen for significant period of time.  Side effects reviewed.    We  reviewed termination issues.  Writer is leaving clinic in March 2024.  He will follow-up with another [facility] psychiatrist.  He expresses understanding.         Visit Diagnosis:  No diagnosis found.  Past Psychiatric History: Please see intake H&P.  Past Medical History:  Past Medical History:  Diagnosis Date   Allergy    dog and cats, citrus   Anxiety    Asthma    Chronic pain    Depression    Diabetes mellitus    Glaucoma    Neuromuscular disorder ([place])    Paranoid schizophrenia ([place])     Past Surgical History:  Procedure Laterality Date   ANKLE ARTHROSCOPY     Arm Surgery  1/12   rt ulnar nerve decompression   EPIDURAL BLOCK INJECTION  several   ULNAR NERVE TRANSPOSITION  01/19/2012   Procedure: ULNAR NERVE DECOMPRESSION/TRANSPOSITION;  Surgeon: Cammie Sickle., MD;  Location: [facility];  Service: Orthopedics;  Laterality: Left;  ulnar nerve decompression vs transposition left elbow    Family Psychiatric History: None.  Family History:  Family History  Problem Relation Age of Onset   Diabetes Father    Diabetes Paternal Uncle    Colon cancer Neg Hx    Colon polyps Neg Hx    Esophageal cancer Neg Hx    Rectal cancer Neg Hx    Stomach cancer Neg Hx     Social  History:  Social History   Socioeconomic History   Marital status: Single    Spouse name: Not on file   Number of children: 0   Years of education: BA   Highest education level: Not on file  Occupational History   Occupation: Unemlpoyed-disabled  Tobacco Use   Smoking status: Never   Smokeless tobacco: Never  Vaping Use   Vaping Use: Never used  Substance and Sexual Activity   Alcohol use: No   Drug use: No   Sexual activity: Yes  Other Topics Concern   Not on file  Social History Narrative   Lives with mother   Caffeine use: Coke very rare   Right handed    Social Determinants of Health   Financial Resource Strain: Low Risk  (10/03/2022)   Overall Financial Resource Strain (CARDIA)    Difficulty of Paying Living Expenses: Not hard at all  Food Insecurity: No Food Insecurity (10/03/2022)   Hunger Vital Sign    Worried About Running Out of Food in the Last Year: Never true    Ran Out of Food in the Last Year: Never true  Transportation Needs: No Transportation Needs (10/03/2022)   PRAPARE - Hydrologist (Medical): No    Lack of Transportation (Non-Medical): No  Physical Activity: Insufficiently Active (10/03/2022)   Exercise Vital Sign    Days of Exercise per Week: 4 days    Minutes of Exercise per Session: 30 min  Stress: Stress Concern Present (10/03/2022)   [place]    Feeling of Stress : To some extent  Social Connections: Socially Isolated (09/21/2021)   Social Connection and Isolation Panel [NHANES]    Frequency of Communication with Friends and Family: Three times a week    Frequency of Social Gatherings with Friends and Family: Three times a week    Attends Religious Services: Never    Active Member of Clubs or Organizations: No    Attends Archivist Meetings: Not on file    Marital Status: Never married    Allergies:  Allergies  Allergen Reactions   Cat  Hair Extract    Citric Acid Other (See Comments)    Runny nose, eyes water   Food     Oranges or anything with citric acid   Gabapentin     Walking into things, hard to maintain balance, falls    Metabolic Disorder Labs: Lab Results  Component Value Date   HGBA1C 8.8 (H) 07/26/2022   MPG 123 03/02/2018   No results found for: "PROLACTIN" Lab Results  Component Value Date   CHOL 131 07/26/2022   TRIG 59.0 07/26/2022   HDL 61.00 07/26/2022   CHOLHDL 2 07/26/2022   VLDL 11.8 07/26/2022   LDLCALC 58 07/26/2022   LDLCALC 77 07/15/2021  Lab Results  Component Value Date   TSH 1.02 07/08/2019   TSH 0.92 03/15/2018    Therapeutic Level Labs: No results found for: "LITHIUM" No results found for: "VALPROATE" No results found for: "CBMZ"  Current Medications: Current Outpatient Medications  Medication Sig Dispense Refill   ACCU-CHEK SOFTCLIX LANCETS lancets 3 (three) times daily. for testing  0   albuterol (VENTOLIN HFA) 108 (90 Base) MCG/ACT inhaler Inhale 1-2 puffs into the lungs every 6 (six) hours as needed for wheezing or shortness of breath. 18 g 1   amantadine (SYMMETREL) 100 MG capsule TAKE 1 CAPSULE(100 MG) BY MOUTH TWICE DAILY 180 capsule 1   Cariprazine HCl (VRAYLAR) 6 MG CAPS TAKE 1 CAPSULE(6 MG) BY MOUTH DAILY 30 capsule 2   clonazePAM (KLONOPIN) 1 MG tablet Take 1 tablet (1 mg total) by mouth 3 (three) times daily as needed for anxiety. for anxiety 90 tablet 2   cyanocobalamin (VITAMIN B12) 1000 MCG tablet Take 1,000 mcg by mouth daily.     diphenhydrAMINE (BENADRYL) 25 mg capsule Take 25 mg by mouth every 6 (six) hours as needed. (Patient not taking: Reported on 10/03/2022)     fexofenadine (ALLEGRA ODT) 30 MG disintegrating tablet Take 30 mg by mouth daily.     fluPHENAZine (PROLIXIN) 5 MG tablet Take one tablet ( 5 mgr)  at bedtime 30 tablet 2   fluticasone (FLONASE) 50 MCG/ACT nasal spray SHAKE LIQUID AND USE 2 SPRAYS IN EACH NOSTRIL DAILY 16 g 6   glipiZIDE  (GLUCOTROL) 10 MG tablet TAKE 1 TABLET(10 MG) BY MOUTH TWICE DAILY 180 tablet 3   glucose blood (ACCU-CHEK GUIDE) test strip 1 each by Other route in the morning, at noon, and at bedtime. Use to test glucose levels 3 times daily 200 strip 3   JARDIANCE 25 MG TABS tablet TAKE 1 TABLET BY MOUTH DAILY 90 tablet 2   metFORMIN (GLUCOPHAGE-XR) 500 MG 24 hr tablet TAKE 2 TABLETS(1000 MG) BY MOUTH TWICE DAILY 360 tablet 3   montelukast (SINGULAIR) 10 MG tablet TAKE 1 TABLET(10 MG) BY MOUTH AT BEDTIME 90 tablet 3   RESTASIS 0.05 % ophthalmic emulsion  (Patient not taking: Reported on 10/03/2022)     sertraline (ZOLOFT) 100 MG tablet Take 1 tablet (100 mg total) by mouth daily. 30 tablet 2   simvastatin (ZOCOR) 20 MG tablet TAKE 1 TABLET(20 MG) BY MOUTH EVERY EVENING 90 tablet 3   tadalafil (CIALIS) 20 MG tablet TAKE ONE-HALF TO ONE TABLET BY MOUTH EVERY OTHER DAY AS NEEDED FOR ERECTILE DYSFUNCTION 10 tablet 2   triamcinolone ointment (KENALOG) 0.1 % Apply a thin coat to rash on elbows twice daily 30 g 0   TYRVAYA 0.03 MG/ACT SOLN Spray 1 spray in each nostril twice daily, approximately 12 hours apart     Current Facility-Administered Medications  Medication Dose Route Frequency Provider Last Rate Last Admin   methylPREDNISolone acetate (DEPO-MEDROL) injection 80 mg  80 mg Other Once Magnus Sinning, MD           Psychiatric Specialty Exam:  Blood pressure 128/81, pulse 80, resp. rate 18, height 5' 9.5" (1.765 m), weight 198 lb 9.6 oz (90.1 kg).Body mass index is 28.91 kg/m.  General Appearance: Casually groomed, makes good eye contact, polite/cooperative, no psychomotor agitation or restlessness  Eye Contact: Good eye contact  Speech:  Normal / fluent   Volume:  Normal  Mood: Reports mood as stable , does not endorse depression.  Affect: Vaguely blunted  Thought Process: Thought  process presents linear and associations are intact  Orientation:  Full (Time, Place, and Person)  Thought Content:  Denies hallucinations and does not appear internally preoccupied.  No delusions are expressed.  He has a long/chronic history of paranoid ideations.  Currently states he has not had any concerns about suspicious activity or about being monitored/followed recently.  Suicidal Thoughts:  No denies SI ideations  Homicidal Thoughts:  No   no homicidal or violent ideations  Memory: recent and remote grossly intact   Judgement:  present   Insight:  Fair  Psychomotor Activity: N/A  Concentration:  Good   Recall:  Good  Fund of Knowledge: Good  Language: Good  Akathisia:  Negative  Handed:  Right  AIMS (if indicated): as above   Assets:  Communication Skills Desire for Improvement Financial Resources/Insurance Housing  ADL's:  Intact  Cognition: WNL  Sleep:  reports sleeping " OK"    Screenings: [place] Office Visit from 07/26/2022 in [place] at [facility]  Total GAD-7 Score 5      PHQ2-9    [place] from 10/03/2022 in [facility] at [facility] from 09/30/2022 in [place] at [facility] from 09/21/2022 in [place] at [facility] Visit from 07/26/2022 in [place] at [facility] [facility] Visit from 07/19/2022 in [place] at [facility]  PHQ-2 Total Score 0 0 0 2 0  PHQ-9 Total Score -- -- -- 7 --      Flowsheet Row Counselor from 09/23/2022 in [place] at [facility] from 08/12/2022 in [place] at [facility] from 06/27/2022 in [place] at [facility] No Risk No Risk No Risk        Assessment and Plan: 57yo single AAM with schizophrenia paranoid type vs schizoaffective disorder. On disability, lives with  mother   Currently Mr. Vansomeren presents stable.  He has a long history of paranoid ideations involving feeling he is being monitored/followed, concerned about "suspicious" vehicles or persons.  Paranoid symptoms have been occurring for many years and as per description used to be significantly more severe than they are now.  He states that recently he has not had any significant concerns and states he has not recently seen any suspicious activity.  He does not currently seem focused on or concerned about paranoid ideations.   Mood has been stable, does not endorse depression, does not endorse anhedonia and continues to exhibit interest in activities such as photography and learning other languages.  He reports he is taking medications as prescribed.  Denies any BZD misuse or abuse .  He does not endorse side effects.  He does report some subjective feeling of restlessness but no akathisia is noted in was noted to be able to sit comfortably throughout today's visit.   We have reviewed medication side effects.  Historically he has responded well to 2 antipsychotic ( Vraylar, Prolixin) regimen.  We reviewed potential for side effects to include possible element of agitation based on his description of subjective restlessness (otherwise noted he appeared calm, comfortable during visit).  Reviewed medication options.  He expressed wanting to continue on current medication regimen.  He states it has been very helpful and that current medication regimen has resulted in better quality of life/ability to function. States "  I haven't thought of wanting to die since I have been on these meds ".   We have again reviewed termination issues/clinician will be leaving clinic in March 2024.  He will follow-up with another psychiatrist here at the clinic/  expresses understanding  Patient side effects reviewed    Plan:  Continue Zoloft 100 mg  QDAY for depression, anxiety Continue Vraylar 6 mg for paranoia,  psychosis Continue Klonopin 1 mg TID PRN for anxiety Continue Amantadine 100 mg BID to minimize tremors /EPS  Continue Fluphenazine 5 mg QHS for paranoia , psychosis   Next appointment in approximately in 4-6 weeks . ( With Dr Nelida Gores)  Continue individual psychotherapy.  Agrees to contact clinic sooner if any worsening or concern prior. He has clinic number and crisis hotline number if needed .       Jenne Campus, MD 10/20/2022, 2:46 PM Patient ID: Glenn Robertson, male   DOB: 06-Oct-1965, 57 y.o.   MRN: [id number]

## 2022-10-21 NOTE — Telephone Encounter (Signed)
Called patient informed patient that form that was dropped off was faxed on 10/12/22 with conformation. Spoke with pharmacist who states that form was received they were going to check and see why rejection was still popping up. Asked patient to call pharmacy for status of medication. Patient states that he found 1 bottle of strips not completely out as of now.

## 2022-10-21 NOTE — Telephone Encounter (Signed)
Pt called to check the status, he was told Dr Ethelene Hal is out of the office and it will be Monday before it can be signed. It is for test strips.

## 2022-10-24 ENCOUNTER — Ambulatory Visit (INDEPENDENT_AMBULATORY_CARE_PROVIDER_SITE_OTHER): Payer: Medicare Other | Admitting: Licensed Clinical Social Worker

## 2022-10-24 DIAGNOSIS — F251 Schizoaffective disorder, depressive type: Secondary | ICD-10-CM

## 2022-10-24 NOTE — Progress Notes (Signed)
Virtual Visit via Video Note  I connected with Glenn Robertson on 10/24/22 at  9:00 AM EST by a video enabled telemedicine application and verified that I am speaking with the correct person using two identifiers.  Location: Patient: home Provider: remote office [place], [name])   I discussed the limitations of evaluation and management by telemedicine and the availability of in person appointments. The patient expressed understanding and agreed to proceed.   I discussed the assessment and treatment plan with the patient. The patient was provided an opportunity to ask questions and all were answered. The patient agreed with the plan and demonstrated an understanding of the instructions.   The patient was advised to call back or seek an in-person evaluation if the symptoms worsen or if the condition fails to improve as anticipated.  I provided 40 minutes of non-face-to-face time during this encounter.   [place], LCSW   THERAPIST PROGRESS NOTE  Session Time: [phone]  Participation Level: Active  Behavioral Response: Neat and Well GroomedAlertDepressed  Type of Therapy: Individual Therapy  Treatment Goals addressed: Develop healthy thinking patterns and beliefs about self, others, and the world that lead to the alleviation and help prevent the relapse of depression per pt report 3 out of 5 sessions documented  ProgressTowards Goals: Progressing  Interventions: CBT--identifying/challenging thought distortions  Summary: Glenn Robertson is a 57 y.o. male who presents with continuing symptoms related to schizoaffective disorder.  Patient reports his overall mood has been stable, but he is felt an increase in restlessness recently.  Patient reports that he is compliant with his medication, and is getting good quality and quantity of sleep.  Patient reports that he is compliant with his psychiatric appointments.  Allowed pt to explore and express thoughts and feelings  associated with recent life situations and external stressors.  Patient states that he is worried about the overall health of his father--father is currently in the hospital in [place], [place].  Allowed patient to explore thoughts and feelings that this has triggered--patient described a very difficult time of life when he was living in [place] with his father.  Patient states that he is now living in [place] for 12 years.  Patient feels more stable currently than in the past.  Patient reports that he is being intentional about making healthier food choices than in the past, but is still eating a lot of sweets each day.  Allowed patient to identify any eating behavior modifications that he would like to make.  Encouraged patient to make positive changes.  Patient reports that he is continuing to take classes in [place], and would like to take more foreign language classes in the future.  Patient states he is seeking out classes at [facility] and [facility].  Patient states he also enjoys photography, and wants to get out and do more photography when the weather improves.  Discussed social engagement, and patient made the statement " I have more enemies than friends, that is why would like to move to a city and just be completely anonymous where nobody knows me".  Allowed patient to explore and identify why he feels this way, and discussed friendships, relationships that he has had in the past.  Patient states there are things about him that are triggers and other people, and that he cannot do anything about it, so he makes the intentional choice to stay away at times.  Continued recommendations are as follows: self care behaviors, positive social engagements, focusing  on overall work/home/life balance, and focusing on positive physical and emotional wellness.   Suicidal/Homicidal: No  Therapist Response: Pt is continuing to apply interventions learned in session into daily life  situations. Pt is currently on track to meet goals utilizing interventions mentioned above. Personal growth and progress noted. Treatment to continue as indicated.   Plan: Return again in 4 weeks.  Diagnosis:  Encounter Diagnosis  Name Primary?   Schizoaffective disorder, depressive type ([place]) Yes    Collaboration of Care: Other Pt encouraged to continue care with psychiatrist of record, Dr. Felicita Gage [place].   Patient/Guardian was advised Release of Information must be obtained prior to any record release in order to collaborate their care with an outside provider. Patient/Guardian was advised if they have not already done so to contact the registration department to sign all necessary forms in order for [place] to release information regarding their care.   Consent: Patient/Guardian gives verbal consent for treatment and assignment of benefits for services provided during this visit. Patient/Guardian expressed understanding and agreed to proceed.   [place], LCSW 10/24/2022

## 2022-11-09 ENCOUNTER — Ambulatory Visit (INDEPENDENT_AMBULATORY_CARE_PROVIDER_SITE_OTHER): Payer: Medicare Other | Admitting: Family Medicine

## 2022-11-09 ENCOUNTER — Encounter: Payer: Self-pay | Admitting: Family Medicine

## 2022-11-09 VITALS — BP 108/68 | HR 77 | Temp 97.8°F | Ht 69.0 in | Wt 196.0 lb

## 2022-11-09 DIAGNOSIS — E7800 Pure hypercholesterolemia, unspecified: Secondary | ICD-10-CM | POA: Diagnosis not present

## 2022-11-09 DIAGNOSIS — E119 Type 2 diabetes mellitus without complications: Secondary | ICD-10-CM

## 2022-11-09 DIAGNOSIS — R3912 Poor urinary stream: Secondary | ICD-10-CM | POA: Diagnosis not present

## 2022-11-09 LAB — LIPID PANEL
Cholesterol: 115 mg/dL (ref 0–200)
HDL: 52.5 mg/dL (ref >39.00)
LDL Cholesterol: 50 mg/dL (ref 0–99)
NonHDL: 62.02
Total CHOL/HDL Ratio: 2
Triglycerides: 60 mg/dL (ref 0.0–149.0)
VLDL: 12 mg/dL (ref 0.0–40.0)

## 2022-11-09 LAB — BASIC METABOLIC PANEL
BUN: 11 mg/dL (ref 6–23)
CO2: 25 mEq/L (ref 19–32)
Calcium: 9.7 mg/dL (ref 8.4–10.5)
Chloride: 104 mEq/L (ref 96–112)
Creatinine, Ser: 1.01 mg/dL (ref 0.40–1.50)
GFR: 83.33 mL/min (ref >60.00)
Glucose, Bld: 105 mg/dL — ABNORMAL HIGH (ref 70–99)
Potassium: 3.9 mEq/L (ref 3.5–5.1)
Sodium: 138 mEq/L (ref 135–145)

## 2022-11-09 LAB — PSA: PSA: 0.38 ng/mL (ref 0.10–4.00)

## 2022-11-09 LAB — HEMOGLOBIN A1C: Hgb A1c MFr Bld: 8.4 % — ABNORMAL HIGH (ref 4.6–6.5)

## 2022-11-09 NOTE — Progress Notes (Signed)
Established Patient Office Visit   Subjective:  Patient ID: Glenn Robertson, male    DOB: 1965/12/30  Age: 57 y.o. MRN: [id number]  Chief Complaint  Patient presents with   Rash    Rash nat both arm pits very itchy. Patient would like labs pt fasting.     Rash   Encounter Diagnoses  Name Primary?   Type 2 diabetes mellitus without complication, without long-term current use of insulin (HCC) Yes   Elevated cholesterol    Poor urinary stream    For follow-up of type 2 diabetes, elevated cholesterol and PSA check.  Urine stream is somewhat diminished but okay.  This is like it was when he was younger.  Psychiatry is requesting profile.  He assures compliance with the Zocor.  He is fasting this morning.  Continues glipizide 10 twice daily, metformin 1000 twice daily and Jardiance 25 mg daily for diabetes.  He has been able to maintain his well weight.  Continues to exercise walking.   Review of Systems  Constitutional: Negative.   HENT: Negative.    Eyes:  Negative for blurred vision, discharge and redness.  Respiratory: Negative.    Cardiovascular: Negative.   Gastrointestinal:  Negative for abdominal pain.  Genitourinary: Negative.   Musculoskeletal: Negative.  Negative for myalgias.  Skin:  Negative for rash.  Neurological:  Negative for tingling, loss of consciousness and weakness.  Endo/Heme/Allergies:  Negative for polydipsia.     Current Outpatient Medications:    ACCU-CHEK SOFTCLIX LANCETS lancets, 3 (three) times daily. for testing, Disp: , Rfl: 0   albuterol (VENTOLIN HFA) 108 (90 Base) MCG/ACT inhaler, Inhale 1-2 puffs into the lungs every 6 (six) hours as needed for wheezing or shortness of breath., Disp: 18 g, Rfl: 1   amantadine (SYMMETREL) 100 MG capsule, TAKE 1 CAPSULE(100 MG) BY MOUTH TWICE DAILY, Disp: 180 capsule, Rfl: 1   Cariprazine HCl (VRAYLAR) 6 MG CAPS, TAKE 1 CAPSULE(6 MG) BY MOUTH DAILY, Disp: 30 capsule, Rfl: 2   clonazePAM (KLONOPIN) 1 MG tablet,  Take 1 tablet (1 mg total) by mouth 3 (three) times daily as needed for anxiety. for anxiety, Disp: 90 tablet, Rfl: 2   cyanocobalamin (VITAMIN B12) 1000 MCG tablet, Take 1,000 mcg by mouth daily., Disp: , Rfl:    fexofenadine (ALLEGRA ODT) 30 MG disintegrating tablet, Take 30 mg by mouth daily., Disp: , Rfl:    fluPHENAZine (PROLIXIN) 5 MG tablet, Take one tablet ( 5 mgr)  at bedtime, Disp: 30 tablet, Rfl: 2   fluticasone (FLONASE) 50 MCG/ACT nasal spray, SHAKE LIQUID AND USE 2 SPRAYS IN EACH NOSTRIL DAILY, Disp: 16 g, Rfl: 6   glipiZIDE (GLUCOTROL) 10 MG tablet, TAKE 1 TABLET(10 MG) BY MOUTH TWICE DAILY, Disp: 180 tablet, Rfl: 3   glucose blood (ACCU-CHEK GUIDE) test strip, 1 each by Other route in the morning, at noon, and at bedtime. Use to test glucose levels 3 times daily, Disp: 200 strip, Rfl: 3   JARDIANCE 25 MG TABS tablet, TAKE 1 TABLET BY MOUTH DAILY, Disp: 90 tablet, Rfl: 2   metFORMIN (GLUCOPHAGE-XR) 500 MG 24 hr tablet, TAKE 2 TABLETS(1000 MG) BY MOUTH TWICE DAILY, Disp: 360 tablet, Rfl: 3   montelukast (SINGULAIR) 10 MG tablet, TAKE 1 TABLET(10 MG) BY MOUTH AT BEDTIME, Disp: 90 tablet, Rfl: 3   RESTASIS 0.05 % ophthalmic emulsion, , Disp: , Rfl:    sertraline (ZOLOFT) 100 MG tablet, Take 1 tablet (100 mg total) by mouth daily., Disp: 30 tablet,  Rfl: 2   simvastatin (ZOCOR) 20 MG tablet, TAKE 1 TABLET(20 MG) BY MOUTH EVERY EVENING, Disp: 90 tablet, Rfl: 3   tadalafil (CIALIS) 20 MG tablet, TAKE ONE-HALF TO ONE TABLET BY MOUTH EVERY OTHER DAY AS NEEDED FOR ERECTILE DYSFUNCTION, Disp: 10 tablet, Rfl: 2   triamcinolone ointment (KENALOG) 0.1 %, Apply a thin coat to rash on elbows twice daily, Disp: 30 g, Rfl: 0   TYRVAYA 0.03 MG/ACT SOLN, Spray 1 spray in each nostril twice daily, approximately 12 hours apart, Disp: , Rfl:   Current Facility-Administered Medications:    methylPREDNISolone acetate (DEPO-MEDROL) injection 80 mg, 80 mg, Other, Once, Magnus Sinning, MD   Objective:      BP 108/68 (BP Location: Right Arm, Patient Position: Sitting, Cuff Size: Normal)   Pulse 77   Temp 97.8 F (36.6 C) (Temporal)   Ht '5\' 9"'$  (1.753 m)   Wt 196 lb (88.9 kg)   SpO2 97%   BMI 28.94 kg/m  Wt Readings from Last 3 Encounters:  11/09/22 196 lb (88.9 kg)  10/03/22 196 lb 12.8 oz (89.3 kg)  09/21/22 198 lb 9.6 oz (90.1 kg)      Physical Exam Constitutional:      General: He is not in acute distress.    Appearance: Normal appearance. He is not ill-appearing, toxic-appearing or diaphoretic.  HENT:     Head: Normocephalic and atraumatic.     Right Ear: External ear normal.     Left Ear: External ear normal.  Eyes:     General: No scleral icterus.       Right eye: No discharge.        Left eye: No discharge.     Extraocular Movements: Extraocular movements intact.     Conjunctiva/sclera: Conjunctivae normal.  Pulmonary:     Effort: Pulmonary effort is normal. No respiratory distress.  Skin:    General: Skin is warm and dry.  Neurological:     Mental Status: He is alert and oriented to person, place, and time.  Psychiatric:        Mood and Affect: Mood normal.        Behavior: Behavior normal.      No results found for any visits on 11/09/22.    The 10-year ASCVD risk score (Arnett DK, et al., 2019) is: 8%    Assessment & Plan:   Type 2 diabetes mellitus without complication, without long-term current use of insulin (HCC) -     Basic metabolic panel -     Hemoglobin A1c  Elevated cholesterol -     Lipid panel  Poor urinary stream -     PSA    Return in about 6 months (around 05/12/2023).  Continue current medications.  Adjustments will be made pending results of today's labs.  Libby Maw, MD

## 2022-11-10 ENCOUNTER — Ambulatory Visit (INDEPENDENT_AMBULATORY_CARE_PROVIDER_SITE_OTHER): Payer: Medicare Other | Admitting: Licensed Clinical Social Worker

## 2022-11-10 DIAGNOSIS — F251 Schizoaffective disorder, depressive type: Secondary | ICD-10-CM | POA: Diagnosis not present

## 2022-11-10 NOTE — Progress Notes (Signed)
Virtual Visit via Video Note  I connected with Glenn Robertson on 11/10/22 at  1:00 PM EDT by a video enabled telemedicine application and verified that I am speaking with the correct person using two identifiers.  Location: Patient: home Provider: remote office [place], [name])   I discussed the limitations of evaluation and management by telemedicine and the availability of in person appointments. The patient expressed understanding and agreed to proceed.   I discussed the assessment and treatment plan with the patient. The patient was provided an opportunity to ask questions and all were answered. The patient agreed with the plan and demonstrated an understanding of the instructions.   The patient was advised to call back or seek an in-person evaluation if the symptoms worsen or if the condition fails to improve as anticipated.  I provided 43 minutes of non-face-to-face time during this encounter.   Glenn Robertson R Glenn Mcmanaman, LCSW   THERAPIST PROGRESS NOTE  Session Time: 1-143p  Participation Level: Active  Behavioral Response: Neat and Well GroomedAlertDepressed  Type of Therapy: Individual Therapy  Treatment Goals addressed: Develop healthy thinking patterns and beliefs about self, others, and the world that lead to the alleviation and help prevent the relapse of depression per pt report 3 out of 5 sessions documented  ProgressTowards Goals: Progressing  Interventions: CBT--identifying/challenging thought distortions  Summary: Glenn Robertson is a 57 y.o. male who presents with continuing symptoms related to schizoaffective disorder.  Patient reports his overall mood has been stable, but he is felt an increase in restlessness recently.  Patient reports that he is compliant with his medication, and is getting good quality and quantity of sleep.  Patient reports that he is compliant with his psychiatric appointments.  Allowed pt to explore and express thoughts and feelings  associated with recent life situations and external stressors.  Patient reports that recently his A1c has not gone down as much as he was hoping.  Assisted patient in identifying his overall diabetes self-management behaviors.  Patient reports that he is continuing to eat sweets, and not limit carbs.  Patient states that he does limit at times, but recognizes that he needs to do a better job of that.  Patient states that he is not eating a lot of fruit or vegetables, and limited protein.  Patient states that he is binge eating on chips, and likes to eat hamburgers.  Assisted patient in identifying healthier nutritional options.  Clinician used motivational interviewing for improvement of physical activity.  Patient reports that he enjoys riding his bike, but does not like putting it on the rack and does not like driving somewhere to ride his bike. Patient denies any symptoms of depression or symptoms of anxiety at time of session.  Continued recommendations are as follows: self care behaviors, positive social engagements, focusing on overall work/home/life balance, and focusing on positive physical and emotional wellness.   Suicidal/Homicidal: No  Therapist Response: Pt is continuing to apply interventions learned in session into daily life situations. Pt is currently on track to meet goals utilizing interventions mentioned above. Personal growth and progress noted. Treatment to continue as indicated.   Plan: Return again in 4 weeks.  Diagnosis:  Encounter Diagnosis  Name Primary?   Schizoaffective disorder, depressive type ([place]) Yes    Collaboration of Care: Other Pt encouraged to continue care with psychiatrist of record, Dr. Felicita Gage Cobos.   Patient/Guardian was advised Release of Information must be obtained prior to any record release in order to collaborate their care  with an outside provider. Patient/Guardian was advised if they have not already done so to contact the registration  department to sign all necessary forms in order for [place] to release information regarding their care.   Consent: Patient/Guardian gives verbal consent for treatment and assignment of benefits for services provided during this visit. Patient/Guardian expressed understanding and agreed to proceed.   [place], LCSW 11/10/2022

## 2022-11-17 ENCOUNTER — Other Ambulatory Visit: Payer: Self-pay | Admitting: Family Medicine

## 2022-11-17 ENCOUNTER — Ambulatory Visit (INDEPENDENT_AMBULATORY_CARE_PROVIDER_SITE_OTHER): Payer: Medicare Other | Admitting: Family Medicine

## 2022-11-17 ENCOUNTER — Encounter: Payer: Self-pay | Admitting: Family Medicine

## 2022-11-17 VITALS — BP 116/68 | HR 99 | Temp 98.5°F | Ht 69.0 in | Wt 197.0 lb

## 2022-11-17 DIAGNOSIS — E1165 Type 2 diabetes mellitus with hyperglycemia: Secondary | ICD-10-CM

## 2022-11-17 DIAGNOSIS — L251 Unspecified contact dermatitis due to drugs in contact with skin: Secondary | ICD-10-CM | POA: Insufficient documentation

## 2022-11-17 NOTE — Progress Notes (Signed)
Established Patient Office Visit   Subjective:  Patient ID: Glenn Robertson, male    DOB: [birth year]  Age: 57 y.o. MRN: [id number]  Chief Complaint  Patient presents with   Diabetes    Follow up on DM, patient would like referral to dermatologist for armpit rash.     Diabetes Pertinent negatives for diabetes include no blurred vision, no polydipsia and no weakness.   Encounter Diagnoses  Name Primary?   Uncontrolled type 2 diabetes mellitus with hyperglycemia (HCC) Yes   Contact dermatitis due to drugs in contact with skin, unspecified contact dermatitis type    For discussion of type 2 diabetes..  Hemoglobin A1c slightly improved but remains uncontrolled.  Patient assures compliance with Jardiance 25 mg daily, Glucotrol 10 mg twice daily metformin XL 1000 mg twice daily.  He admits to dietary indiscretion with sugary liquids and other sweets.  After various orthopedic injuries he has not been able to run again.  He is trying to develop another exercise routine with walking and biking.  He would like to avoid more medicines.  Ongoing rash in his axilla area.  Has tried multiple antiperspirants without relief.     Review of Systems  Constitutional: Negative.   HENT: Negative.    Eyes:  Negative for blurred vision, discharge and redness.  Respiratory: Negative.    Cardiovascular: Negative.   Gastrointestinal:  Negative for abdominal pain.  Genitourinary: Negative.   Musculoskeletal: Negative.  Negative for myalgias.  Skin:  Negative for rash.  Neurological:  Negative for tingling, loss of consciousness and weakness.  Endo/Heme/Allergies:  Negative for polydipsia.     Current Outpatient Medications:    ACCU-CHEK SOFTCLIX LANCETS lancets, 3 (three) times daily. for testing, Disp: , Rfl: 0   albuterol (VENTOLIN HFA) 108 (90 Base) MCG/ACT inhaler, Inhale 1-2 puffs into the lungs every 6 (six) hours as needed for wheezing or shortness of breath., Disp: 18 g, Rfl: 1   amantadine  (SYMMETREL) 100 MG capsule, TAKE 1 CAPSULE(100 MG) BY MOUTH TWICE DAILY, Disp: 180 capsule, Rfl: 1   Cariprazine HCl (VRAYLAR) 6 MG CAPS, TAKE 1 CAPSULE(6 MG) BY MOUTH DAILY, Disp: 30 capsule, Rfl: 2   clonazePAM (KLONOPIN) 1 MG tablet, Take 1 tablet (1 mg total) by mouth 3 (three) times daily as needed for anxiety. for anxiety, Disp: 90 tablet, Rfl: 2   cyanocobalamin (VITAMIN B12) 1000 MCG tablet, Take 1,000 mcg by mouth daily., Disp: , Rfl:    fexofenadine (ALLEGRA ODT) 30 MG disintegrating tablet, Take 30 mg by mouth daily., Disp: , Rfl:    fluPHENAZine (PROLIXIN) 5 MG tablet, Take one tablet ( 5 mgr)  at bedtime, Disp: 30 tablet, Rfl: 2   fluticasone (FLONASE) 50 MCG/ACT nasal spray, SHAKE LIQUID AND USE 2 SPRAYS IN EACH NOSTRIL DAILY, Disp: 16 g, Rfl: 6   glipiZIDE (GLUCOTROL) 10 MG tablet, TAKE 1 TABLET(10 MG) BY MOUTH TWICE DAILY, Disp: 180 tablet, Rfl: 3   glucose blood (ACCU-CHEK GUIDE) test strip, 1 each by Other route in the morning, at noon, and at bedtime. Use to test glucose levels 3 times daily, Disp: 200 strip, Rfl: 3   JARDIANCE 25 MG TABS tablet, TAKE 1 TABLET BY MOUTH DAILY, Disp: 90 tablet, Rfl: 2   metFORMIN (GLUCOPHAGE-XR) 500 MG 24 hr tablet, TAKE 2 TABLETS(1000 MG) BY MOUTH TWICE DAILY, Disp: 360 tablet, Rfl: 3   montelukast (SINGULAIR) 10 MG tablet, TAKE 1 TABLET(10 MG) BY MOUTH AT BEDTIME, Disp: 90 tablet, Rfl: 3  RESTASIS 0.05 % ophthalmic emulsion, , Disp: , Rfl:    sertraline (ZOLOFT) 100 MG tablet, Take 1 tablet (100 mg total) by mouth daily., Disp: 30 tablet, Rfl: 2   simvastatin (ZOCOR) 20 MG tablet, TAKE 1 TABLET(20 MG) BY MOUTH EVERY EVENING, Disp: 90 tablet, Rfl: 3   tadalafil (CIALIS) 20 MG tablet, TAKE ONE-HALF TO ONE TABLET BY MOUTH EVERY OTHER DAY AS NEEDED FOR ERECTILE DYSFUNCTION, Disp: 10 tablet, Rfl: 2   triamcinolone ointment (KENALOG) 0.1 %, Apply a thin coat to rash on elbows twice daily, Disp: 30 g, Rfl: 0   TYRVAYA 0.03 MG/ACT SOLN, Spray 1 spray in  each nostril twice daily, approximately 12 hours apart, Disp: , Rfl:   Current Facility-Administered Medications:    methylPREDNISolone acetate (DEPO-MEDROL) injection 80 mg, 80 mg, Other, Once, Magnus Sinning, MD   Objective:     BP 116/68 (BP Location: Right Arm, Patient Position: Sitting, Cuff Size: Normal)   Pulse 99   Temp 98.5 F (36.9 C) (Temporal)   Ht 5\' 9"  (1.753 m)   Wt 197 lb (89.4 kg)   SpO2 95%   BMI 29.09 kg/m    Physical Exam Constitutional:      General: He is not in acute distress.    Appearance: Normal appearance. He is not ill-appearing, toxic-appearing or diaphoretic.  HENT:     Head: Normocephalic and atraumatic.     Right Ear: External ear normal.     Left Ear: External ear normal.  Eyes:     General: No scleral icterus.       Right eye: No discharge.        Left eye: No discharge.     Extraocular Movements: Extraocular movements intact.     Conjunctiva/sclera: Conjunctivae normal.  Pulmonary:     Effort: Pulmonary effort is normal. No respiratory distress.  Skin:    General: Skin is warm and dry.  Neurological:     Mental Status: He is alert and oriented to person, place, and time.  Psychiatric:        Mood and Affect: Mood normal.        Behavior: Behavior normal.      No results found for any visits on 11/17/22.    The ASCVD Risk score (Arnett DK, et al., 2019) failed to calculate for the following reasons:   The valid total cholesterol range is 130 to 320 mg/dL    Assessment & Plan:   Uncontrolled type 2 diabetes mellitus with hyperglycemia (HCC) -     Amb Referral to Nutrition and Diabetic Education  Contact dermatitis due to drugs in contact with skin, unspecified contact dermatitis type -     Ambulatory referral to Dermatology    Return in about 3 months (around 02/17/2023).  For now we will continue current medications.  He will continue weight loss efforts and to develop new exercise routines.  He will avoid simple  sugars and drinks and food ingested.  Dermatology referral will contact dermatitis.  Libby Maw, MD

## 2022-11-30 ENCOUNTER — Encounter: Payer: Medicare Other | Attending: Family Medicine | Admitting: Dietician

## 2022-11-30 ENCOUNTER — Encounter: Payer: Self-pay | Admitting: Dietician

## 2022-11-30 DIAGNOSIS — E1165 Type 2 diabetes mellitus with hyperglycemia: Secondary | ICD-10-CM | POA: Diagnosis not present

## 2022-11-30 NOTE — Patient Instructions (Addendum)
Goal: Exercise 4 days per week for at least 30 minutes - in progress, continue.   Goal: aim to eat 3 consistent meals per day (1/4 plate protein, 1/4 plate complex carbs, 1/2 plate vegetables) - in progress, continue.    When you're going out to run errands make sure to pack a balanced snack (carbohydrate + protein) (see snack sheet) -in progress, continue.    Keep a food log for 1 week and bring it back next time. - goal met.  New goal: Goal: aim to make 1/2 of your plate vegetables 1x/day (broccoli, carrots, cauliflower, green beans, celery, cucumber)
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
## 2022-12-05 ENCOUNTER — Telehealth: Payer: Self-pay | Admitting: Family Medicine

## 2022-12-05 DIAGNOSIS — J4520 Mild intermittent asthma, uncomplicated: Secondary | ICD-10-CM

## 2022-12-05 NOTE — Telephone Encounter (Signed)
Caller Name: Robbin  Call back phone #: [phone]   MEDICATION(S):  Albuterol  Days of Med Remaining: 0  Has the patient contacted their pharmacy (YES/NO)? no What did pharmacy advise?   Preferred Pharmacy:  [facility] on [address].

## 2022-12-06 MED ORDER — ALBUTEROL SULFATE HFA 108 (90 BASE) MCG/ACT IN AERS: 1.0000 | INHALATION_SPRAY | Freq: Four times a day (QID) | RESPIRATORY_TRACT | 0 refills | Status: DC | PRN

## 2022-12-07 ENCOUNTER — Ambulatory Visit (HOSPITAL_COMMUNITY): Payer: Medicare Other | Admitting: Student in an Organized Health Care Education/Training Program

## 2022-12-12 ENCOUNTER — Encounter: Payer: Self-pay | Admitting: *Deleted

## 2022-12-21 ENCOUNTER — Encounter (HOSPITAL_COMMUNITY): Payer: Self-pay | Admitting: Student in an Organized Health Care Education/Training Program

## 2022-12-21 ENCOUNTER — Encounter (HOSPITAL_COMMUNITY): Payer: Self-pay | Admitting: Licensed Clinical Social Worker

## 2022-12-21 ENCOUNTER — Ambulatory Visit (HOSPITAL_BASED_OUTPATIENT_CLINIC_OR_DEPARTMENT_OTHER): Payer: Medicare Other | Admitting: Student in an Organized Health Care Education/Training Program

## 2022-12-21 VITALS — BP 112/73 | HR 83 | Ht 69.0 in | Wt 195.8 lb

## 2022-12-21 DIAGNOSIS — F200 Paranoid schizophrenia: Secondary | ICD-10-CM

## 2022-12-21 DIAGNOSIS — R404 Transient alteration of awareness: Secondary | ICD-10-CM | POA: Diagnosis not present

## 2022-12-21 DIAGNOSIS — F3341 Major depressive disorder, recurrent, in partial remission: Secondary | ICD-10-CM

## 2022-12-21 DIAGNOSIS — G4700 Insomnia, unspecified: Secondary | ICD-10-CM | POA: Diagnosis not present

## 2022-12-21 MED ORDER — TRAZODONE HCL 50 MG PO TABS
50.0000 mg | ORAL_TABLET | Freq: Every evening | ORAL | 0 refills | Status: DC | PRN
Start: 2022-12-21 — End: 2023-11-08

## 2022-12-21 MED ORDER — VRAYLAR 6 MG PO CAPS
ORAL_CAPSULE | ORAL | 2 refills | Status: DC
Start: 2022-12-21 — End: 2023-02-02

## 2022-12-21 MED ORDER — SERTRALINE HCL 100 MG PO TABS: 100.0000 mg | ORAL_TABLET | Freq: Every day | ORAL | 2 refills | Status: DC

## 2022-12-21 MED ORDER — FLUPHENAZINE HCL 5 MG PO TABS: ORAL_TABLET | ORAL | 2 refills | Status: DC

## 2022-12-21 MED ORDER — CLONAZEPAM 1 MG PO TABS: 1.0000 mg | ORAL_TABLET | Freq: Three times a day (TID) | ORAL | 0 refills | Status: DC | PRN

## 2022-12-21 MED ORDER — AMANTADINE HCL 100 MG PO CAPS
100.0000 mg | ORAL_CAPSULE | Freq: Three times a day (TID) | ORAL | 0 refills | Status: DC
Start: 2022-12-21 — End: 2023-02-02

## 2022-12-21 NOTE — Progress Notes (Signed)
BH MD/PA/NP OP Progress Note  12/21/2022 10:14 AM Glenn Robertson  MRN:  [id number]  Chief Complaint:  Chief Complaint  Patient presents with   Follow-up   Paranoid   HPI: Glenn Robertson is a 57 yr old male who presents for Follow Up and Medication Management.  PPHx is significant for Paranoid Schizophrenia and MDD, and no history of Suicide Attempts or Psychiatric Hospitalizations.   He reports that he has continued to remain stable since his last appointment.  He reports his main issue continues to be trouble concentrating.  He reports it has severely impacting his ability to take his Spanish classes at [facility] [employer].  He reports this is also happening at home where he is restless and often will be pacing.  He reports that this started after starting the Vraylar a few years ago.  He reports it is so bad that he no longer reads books because he cannot sit still long enough to do it.  He reports that prior to starting the Vraylar his symptoms were very poorly controlled and he had SI with a plan to jump from a bridge and so is hesitant to make any changes to the Vraylar.  He reports that the amantadine has helped with the tremor in his left arm to where it is now a very slight tremor.  Discussed increasing his amantadine to help with restlessness.  Discussed taking the amantadine 3 times a day but he reports that this would be very difficult to discuss that he could take 200 mg in the morning and 100 mg in the evening.  Discussed [place] with this and side effects and he was agreeable to the trial.  He reports he has had some issues with sleep of racing thoughts.  He requests restarting as needed trazodone as this was helpful for him in the past.  He reports no SI, HI, or AVH.  He does report significant paranoia thinking people are following him and he reports that it has been this way ever since moving from [place].  He reports he tries to just ignore it and continue doing whenever he is doing.  He reports  his sleep is fair.  He reports his appetite is good.  He reports occasional mild dizziness but otherwise reports no other concerns at present.  He will return for follow-up approximately 6 weeks.   Visit Diagnosis:    ICD-10-CM   1. Paranoid schizophrenia, chronic condition  F20.0 fluPHENAZine (PROLIXIN) 5 MG tablet    Cariprazine HCl (VRAYLAR) 6 MG CAPS    amantadine (SYMMETREL) 100 MG capsule    2. Transient alteration of awareness  R40.4 clonazePAM (KLONOPIN) 1 MG tablet    3. Insomnia, unspecified type  G47.00 traZODone (DESYREL) 50 MG tablet    4. Recurrent major depressive disorder, in partial remission  F33.41 sertraline (ZOLOFT) 100 MG tablet      Past Psychiatric History: Paranoid Schizophrenia and MDD, and no history of Suicide Attempts or Psychiatric Hospitalizations.  Past Medical History:  Past Medical History:  Diagnosis Date   Allergy    dog and cats, citrus   Anxiety    Asthma    Chronic pain    Depression    Diabetes mellitus    Glaucoma    Neuromuscular disorder    Paranoid schizophrenia     Past Surgical History:  Procedure Laterality Date   ANKLE ARTHROSCOPY     Arm Surgery  1/12   rt ulnar nerve decompression   EPIDURAL BLOCK  INJECTION     several   ULNAR NERVE TRANSPOSITION  01/19/2012   Procedure: ULNAR NERVE DECOMPRESSION/TRANSPOSITION;  Surgeon: Wyn Forster., MD;  Location: [facility];  Service: Orthopedics;  Laterality: Left;  ulnar nerve decompression vs transposition left elbow    Family Psychiatric History: Mother- Depression and PTSD, on Abilify Paternal Aunt- Paranoia, hoarding  Family History:  Family History  Problem Relation Age of Onset   Diabetes Father    Diabetes Paternal Uncle    Colon cancer Neg Hx    Colon polyps Neg Hx    Esophageal cancer Neg Hx    Rectal cancer Neg Hx    Stomach cancer Neg Hx     Social History:  Social History   Socioeconomic History   Marital status: Single    Spouse  name: Not on file   Number of children: 0   Years of education: BA   Highest education level: Not on file  Occupational History   Occupation: Unemlpoyed-disabled  Tobacco Use   Smoking status: Never   Smokeless tobacco: Never  Vaping Use   Vaping Use: Never used  Substance and Sexual Activity   Alcohol use: No   Drug use: No   Sexual activity: Yes  Other Topics Concern   Not on file  Social History Narrative   Lives with mother   Caffeine use: Coke very rare   Right handed    Social Determinants of Health   Financial Resource Strain: Low Risk  (10/03/2022)   Overall Financial Resource Strain (CARDIA)    Difficulty of Paying Living Expenses: Not hard at all  Food Insecurity: No Food Insecurity (10/03/2022)   Hunger Vital Sign    Worried About Running Out of Food in the Last Year: Never true    Ran Out of Food in the Last Year: Never true  Transportation Needs: No Transportation Needs (10/03/2022)   PRAPARE - Administrator, Civil Service (Medical): No    Lack of Transportation (Non-Medical): No  Physical Activity: Insufficiently Active (10/03/2022)   Exercise Vital Sign    Days of Exercise per Week: 4 days    Minutes of Exercise per Session: 30 min  Stress: Stress Concern Present (10/03/2022)   [employer] of Occupational Health - Occupational Stress Questionnaire    Feeling of Stress : To some extent  Social Connections: Socially Isolated (09/21/2021)   Social Connection and Isolation Panel [NHANES]    Frequency of Communication with Friends and Family: Three times a week    Frequency of Social Gatherings with Friends and Family: Three times a week    Attends Religious Services: Never    Active Member of Clubs or Organizations: No    Attends Banker Meetings: Not on file    Marital Status: Never married    Allergies:  Allergies  Allergen Reactions   Cat Hair Extract    Citric Acid Other (See Comments)    Runny nose, eyes water   Food      Oranges or anything with citric acid   Gabapentin     Walking into things, hard to maintain balance, falls    Metabolic Disorder Labs: Lab Results  Component Value Date   HGBA1C 8.4 (H) 11/09/2022   MPG 123 03/02/2018   No results found for: "PROLACTIN" Lab Results  Component Value Date   CHOL 115 11/09/2022   TRIG 60.0 11/09/2022   HDL 52.50 11/09/2022   CHOLHDL 2 11/09/2022   VLDL  12.0 11/09/2022   LDLCALC 50 11/09/2022   LDLCALC 58 07/26/2022   Lab Results  Component Value Date   TSH 1.02 07/08/2019   TSH 0.92 03/15/2018    Therapeutic Level Labs: No results found for: "LITHIUM" No results found for: "VALPROATE" No results found for: "CBMZ"  Current Medications: Current Outpatient Medications  Medication Sig Dispense Refill   traZODone (DESYREL) 50 MG tablet Take 1 tablet (50 mg total) by mouth at bedtime as needed for sleep. 30 tablet 0   ACCU-CHEK SOFTCLIX LANCETS lancets 3 (three) times daily. for testing  0   albuterol (VENTOLIN HFA) 108 (90 Base) MCG/ACT inhaler Inhale 1-2 puffs into the lungs every 6 (six) hours as needed for wheezing or shortness of breath. 18 g 0   amantadine (SYMMETREL) 100 MG capsule Take 1 capsule (100 mg total) by mouth 3 (three) times daily. Take 2 capsules in the morning (200 mg) and one capsule (100 mg) in the evening 270 capsule 0   Cariprazine HCl (VRAYLAR) 6 MG CAPS TAKE 1 CAPSULE(6 MG) BY MOUTH DAILY 30 capsule 2   [START ON 01/10/2023] clonazePAM (KLONOPIN) 1 MG tablet Take 1 tablet (1 mg total) by mouth 3 (three) times daily as needed for anxiety. for anxiety 90 tablet 0   cyanocobalamin (VITAMIN B12) 1000 MCG tablet Take 1,000 mcg by mouth daily.     fexofenadine (ALLEGRA ODT) 30 MG disintegrating tablet Take 30 mg by mouth daily.     fluPHENAZine (PROLIXIN) 5 MG tablet Take one tablet ( 5 mgr)  at bedtime 30 tablet 2   fluticasone (FLONASE) 50 MCG/ACT nasal spray SHAKE LIQUID AND USE 2 SPRAYS IN EACH NOSTRIL DAILY 16 g 6    glipiZIDE (GLUCOTROL) 10 MG tablet TAKE 1 TABLET(10 MG) BY MOUTH TWICE DAILY 180 tablet 3   glucose blood (ACCU-CHEK GUIDE) test strip 1 each by Other route in the morning, at noon, and at bedtime. Use to test glucose levels 3 times daily 200 strip 3   JARDIANCE 25 MG TABS tablet TAKE 1 TABLET BY MOUTH DAILY 90 tablet 2   metFORMIN (GLUCOPHAGE-XR) 500 MG 24 hr tablet TAKE 2 TABLETS(1000 MG) BY MOUTH TWICE DAILY 360 tablet 3   montelukast (SINGULAIR) 10 MG tablet TAKE 1 TABLET(10 MG) BY MOUTH AT BEDTIME 90 tablet 3   RESTASIS 0.05 % ophthalmic emulsion      sertraline (ZOLOFT) 100 MG tablet Take 1 tablet (100 mg total) by mouth daily. 30 tablet 2   simvastatin (ZOCOR) 20 MG tablet TAKE 1 TABLET(20 MG) BY MOUTH EVERY EVENING 90 tablet 3   tadalafil (CIALIS) 20 MG tablet TAKE ONE-HALF TO ONE TABLET BY MOUTH EVERY OTHER DAY AS NEEDED FOR ERECTILE DYSFUNCTION 10 tablet 2   triamcinolone ointment (KENALOG) 0.1 % Apply a thin coat to rash on elbows twice daily 30 g 0   TYRVAYA 0.03 MG/ACT SOLN Spray 1 spray in each nostril twice daily, approximately 12 hours apart     Current Facility-Administered Medications  Medication Dose Route Frequency Provider Last Rate Last Admin   methylPREDNISolone acetate (DEPO-MEDROL) injection 80 mg  80 mg Other Once Tyrell Antonio, MD         Musculoskeletal: Strength & Muscle Tone: within normal limits Gait & Station: normal Patient leans: N/A  Psychiatric Specialty Exam: Review of Systems  Respiratory:  Negative for cough and shortness of breath.   Cardiovascular:  Negative for chest pain.  Gastrointestinal:  Negative for abdominal pain, constipation, diarrhea, nausea and vomiting.  Neurological:  Positive for dizziness (mild). Negative for weakness and headaches.  Psychiatric/Behavioral:  Positive for decreased concentration and sleep disturbance (mild). Negative for dysphoric mood, hallucinations and suicidal ideas. The patient is not nervous/anxious.         Paranoia (chronic)    Blood pressure 112/73, pulse 83, height 5\' 9"  (1.753 m), weight 195 lb 12.8 oz (88.8 kg), SpO2 96 %.Body mass index is 28.91 kg/m.  General Appearance: Casual and Fairly Groomed  Eye Contact:  Fair  Speech:  Clear and Coherent and Slow  Volume:  Normal  Mood:   "ok"  Affect:  Flat  Thought Process:  Coherent and Goal Directed  Orientation:  Full (Time, Place, and Person)  Thought Content: Paranoid Ideation   Suicidal Thoughts:  No  Homicidal Thoughts:  No  Memory:  Immediate;   Good Recent;   Good  Judgement:  Good  Insight:  Good  Psychomotor Activity:  Tremor in left hand/arm  Concentration:  Concentration: Good and Attention Span: Good  Recall:  Good  Fund of Knowledge: Good  Language: Good  Akathisia:   possible, reports restlessness  Handed:  Right  AIMS (if indicated): done AIMS= 0  Assets:  Communication Skills Desire for Improvement Financial Resources/Insurance Housing Resilience Social Support Vocational/Educational  ADL's:  Intact  Cognition: WNL  Sleep:  Fair   Screenings: GAD-7    Garment/textile technologist Visit from 07/26/2022 in [facility] at [employer]  Total GAD-7 Score 5      PHQ2-9    Flowsheet Row Office Visit from 11/17/2022 in [facility] [employer] Visit from 11/09/2022 in [facility] [employer] Clinical Support from 10/03/2022 in [facility] [employer] Nutrition from 09/30/2022 in [facility] at [employer] from 09/21/2022 in [facility] [place] [facility] [employer]  PHQ-2 Total Score 0 0 0 0 0      Flowsheet Row Counselor from 09/23/2022 in [facility] [place] Counselor from 08/12/2022 in [facility] at [facility] from 06/27/2022 in [facility] at  [place]  C-SSRS RISK CATEGORY No Risk No Risk No Risk        Assessment and Plan:  Zuhair Lariccia is a 57 yr old male who presents for Follow Up and Medication Management.  PPHx is significant for Paranoid Schizophrenia and MDD, and no history of Suicide Attempts or Psychiatric Hospitalizations.   Timmy continues to have issues with restlessness which do seem to be from his Vraylar.  As he did have significant issues with stability prior to starting the Vraylar we are hesitant to change that at this time.  Since he did have some improvement with the starting of the Amantadine we will trial increasing this.  Since he reports significant difficulties with taking the amantadine 3 times a day we will instead have him take 200 mg in the morning and at 100 mg in the evening.  Given his occasional issues with insomnia we will also start as needed trazodone.  We will not make any other changes to his medication at this time.  He will return for follow-up in approximately 6 weeks.   Paranoid Schizophrenia: -Continue Vraylar 6 mg daily for paranoia and psychosis.  30 tablets with 2 refills. -Continue Fluphenazine 5 mg QHS for paranoia and psychosis.  30 tablets with 2 refills. -Increase Amantadine to 200  mg AM and 100 mg QHS to minimize tremors /EPS.  270 (100 mg) tablets with 0 refills.   MDD  Anxiety: -Continue Zoloft 100 mg  daily for depression and anxiety.   30 tablets with 2 refills. -Continue Klonopin 1 mg TID PRN for anxiety.  90 tablets with 0 refills to be filled 5/14.   Insomnia: -Start Trazodone 50 mg QHS PRN.  30 tablets with 0 refills.    Collaboration of Care: Collaboration of Care: Other provider involved in patient's care [facility] Therapist  Patient/Guardian was advised Release of Information must be obtained prior to any record release in order to collaborate their care with an outside provider. Patient/Guardian was advised if they have not already done so to contact  the registration department to sign all necessary forms in order for [place] to release information regarding their care.   Consent: Patient/Guardian gives verbal consent for treatment and assignment of benefits for services provided during this visit. Patient/Guardian expressed understanding and agreed to proceed.    Lauro Franklin, MD 12/21/2022, 10:14 AM

## 2022-12-22 ENCOUNTER — Ambulatory Visit (HOSPITAL_COMMUNITY): Payer: Medicare Other | Admitting: Licensed Clinical Social Worker

## 2022-12-28 ENCOUNTER — Other Ambulatory Visit: Payer: Self-pay | Admitting: Family Medicine

## 2022-12-28 DIAGNOSIS — J4520 Mild intermittent asthma, uncomplicated: Secondary | ICD-10-CM

## 2022-12-29 ENCOUNTER — Ambulatory Visit (INDEPENDENT_AMBULATORY_CARE_PROVIDER_SITE_OTHER): Payer: Medicare Other | Admitting: Licensed Clinical Social Worker

## 2022-12-29 DIAGNOSIS — F200 Paranoid schizophrenia: Secondary | ICD-10-CM | POA: Diagnosis not present
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
## 2023-01-11 ENCOUNTER — Telehealth: Payer: Self-pay | Admitting: *Deleted

## 2023-01-11 NOTE — Patient Outreach (Signed)
  Care Coordination   Follow Up Visit Note   01/11/2023 Name: Glenn Robertson MRN: [id number] DOB: 07/25/1966  Glenn Robertson is a 57 y.o. year old male who sees Mliss Sax, MD for primary care. I spoke with  [name] by phone today.  What matters to the patients health and wellness today?  Patient declines offer for home visit or calls, agrees to getting education mailed to him.    Goals Addressed             This Visit's Progress    COMPLETED: manage DM - No follow up requested       Care Coordination Interventions: Provided education to patient about basic DM disease process Reviewed medications with patient and discussed importance of medication adherence Counseled on importance of regular laboratory monitoring as prescribed Discussed plans with patient for ongoing care management follow up and provided patient with direct contact information for care management team Provided patient with written educational materials related to hypo and hyperglycemia and importance of correct treatment         SDOH assessments and interventions completed:  No     Care Coordination Interventions:  Yes, provided   Interventions Today    Flowsheet Row Most Recent Value  Chronic Disease   Chronic disease during today's visit Diabetes  General Interventions   General Interventions Discussed/Reviewed General Interventions Reviewed  [Denied need for additional DM interventions]  Education Interventions   Education Provided Provided Printed Education       Follow up plan: No further intervention required.   Encounter Outcome:  Pt. Visit Completed    Kemper Durie, RN, MSN, [facility] Care Management Care Management Coordinator [phone]

## 2023-01-19 ENCOUNTER — Other Ambulatory Visit: Payer: Self-pay | Admitting: Family Medicine

## 2023-02-01 ENCOUNTER — Ambulatory Visit (HOSPITAL_COMMUNITY): Payer: Medicare Other | Admitting: Student in an Organized Health Care Education/Training Program

## 2023-02-02 ENCOUNTER — Encounter (HOSPITAL_COMMUNITY): Payer: Self-pay | Admitting: Student in an Organized Health Care Education/Training Program

## 2023-02-02 ENCOUNTER — Telehealth (HOSPITAL_BASED_OUTPATIENT_CLINIC_OR_DEPARTMENT_OTHER): Payer: Medicare Other | Admitting: Student in an Organized Health Care Education/Training Program

## 2023-02-02 DIAGNOSIS — F3341 Major depressive disorder, recurrent, in partial remission: Secondary | ICD-10-CM

## 2023-02-02 DIAGNOSIS — F200 Paranoid schizophrenia: Secondary | ICD-10-CM

## 2023-02-02 DIAGNOSIS — R404 Transient alteration of awareness: Secondary | ICD-10-CM

## 2023-02-02 MED ORDER — SERTRALINE HCL 100 MG PO TABS
150.0000 mg | ORAL_TABLET | Freq: Every day | ORAL | 1 refills | Status: DC
Start: 2023-02-02 — End: 2023-04-12

## 2023-02-02 MED ORDER — FLUPHENAZINE HCL 5 MG PO TABS
ORAL_TABLET | ORAL | 1 refills | Status: DC
Start: 2023-02-02 — End: 2023-04-12

## 2023-02-02 MED ORDER — AMANTADINE HCL 100 MG PO CAPS: 100.0000 mg | ORAL_CAPSULE | Freq: Two times a day (BID) | ORAL | Status: DC

## 2023-02-02 MED ORDER — CLONAZEPAM 1 MG PO TABS
1.0000 mg | ORAL_TABLET | Freq: Three times a day (TID) | ORAL | 0 refills | Status: DC | PRN
Start: 2023-02-17 — End: 2023-04-12

## 2023-02-02 MED ORDER — VRAYLAR 6 MG PO CAPS: ORAL_CAPSULE | ORAL | 1 refills | Status: DC

## 2023-02-02 NOTE — Progress Notes (Signed)
BH MD/PA/NP OP Progress Note  02/02/2023 10:06 AM Glenn Robertson  MRN:  [id number]  Chief Complaint:  Chief Complaint  Patient presents with   Follow-up   Paranoid   Schizophrenia   HPI: Glenn Robertson is a 57 yr old male who presents for Follow Up and Medication Management.  PPHx is significant for Paranoid Schizophrenia and MDD, and no history of Suicide Attempts or Psychiatric Hospitalizations.   He reports that he is doing okay.  He reports continuing to have issues with concentration but that he has made improvement in his class grades.  He reports that he was able to make a 93 on the Spanish test after having grades in the 80s throughout the semester.  When asked how he responded to the increase in amantadine he reports that he has not noticed any significant change one way or the other.  When asked how his sleep has been going he reports that his sleep has been good and he has not needed to take the trazodone yet.  He reports that his anxiety is fairly significant significant and at times is preventing him from doing things that he wants to do.  Discussed increasing his Zoloft to address this and is agreeable with this.  Also discussed decreasing his amantadine back to 100 mg twice a day since there was no change with the increase and he was agreeable with this.  He reports no SI, HI, or AVH.  He reports that he has not seen his "stalkers" in a while.  He reports sleep is good.  He reports appetite is doing good.  He reports no other concerns present.  He will return follow-up approximate 6 weeks.   Discussed with patient that Resident Provider would be transitioning their care to another Resident Provider, Dr. Cyndie Chime, starting July 2024.  He reports understanding and has no concerns.   Visit Diagnosis:    ICD-10-CM   1. Paranoid schizophrenia, chronic condition (HCC)  F20.0 Cariprazine HCl (VRAYLAR) 6 MG CAPS    fluPHENAZine (PROLIXIN) 5 MG tablet    amantadine (SYMMETREL) 100 MG  capsule    2. Recurrent major depressive disorder, in partial remission (HCC)  F33.41 sertraline (ZOLOFT) 100 MG tablet    3. Transient alteration of awareness  R40.4 clonazePAM (KLONOPIN) 1 MG tablet      Past Psychiatric History: Paranoid Schizophrenia and MDD, and no history of Suicide Attempts or Psychiatric Hospitalizations.  Past Medical History:  Past Medical History:  Diagnosis Date   Allergy    dog and cats, citrus   Anxiety    Asthma    Chronic pain    Depression    Diabetes mellitus    Glaucoma    Neuromuscular disorder (HCC)    Paranoid schizophrenia (HCC)     Past Surgical History:  Procedure Laterality Date   ANKLE ARTHROSCOPY     Arm Surgery  1/12   rt ulnar nerve decompression   EPIDURAL BLOCK INJECTION     several   ULNAR NERVE TRANSPOSITION  01/19/2012   Procedure: ULNAR NERVE DECOMPRESSION/TRANSPOSITION;  Surgeon: Wyn Forster., MD;  Location: [facility];  Service: Orthopedics;  Laterality: Left;  ulnar nerve decompression vs transposition left elbow    Family Psychiatric History: Mother- Depression and PTSD, on Abilify Paternal Aunt- Paranoia, hoarding  Family History:  Family History  Problem Relation Age of Onset   Diabetes Father    Diabetes Paternal Uncle    Colon cancer Neg Hx  Colon polyps Neg Hx    Esophageal cancer Neg Hx    Rectal cancer Neg Hx    Stomach cancer Neg Hx     Social History:  Social History   Socioeconomic History   Marital status: Single    Spouse name: Not on file   Number of children: 0   Years of education: BA   Highest education level: Not on file  Occupational History   Occupation: Unemlpoyed-disabled  Tobacco Use   Smoking status: Never   Smokeless tobacco: Never  Vaping Use   Vaping Use: Never used  Substance and Sexual Activity   Alcohol use: No   Drug use: No   Sexual activity: Yes  Other Topics Concern   Not on file  Social History Narrative   Lives with mother    Caffeine use: Coke very rare   Right handed    Social Determinants of Health   Financial Resource Strain: Low Risk  (10/03/2022)   Overall Financial Resource Strain (CARDIA)    Difficulty of Paying Living Expenses: Not hard at all  Food Insecurity: No Food Insecurity (10/03/2022)   Hunger Vital Sign    Worried About Running Out of Food in the Last Year: Never true    Ran Out of Food in the Last Year: Never true  Transportation Needs: No Transportation Needs (10/03/2022)   PRAPARE - Administrator, Civil Service (Medical): No    Lack of Transportation (Non-Medical): No  Physical Activity: Insufficiently Active (10/03/2022)   Exercise Vital Sign    Days of Exercise per Week: 4 days    Minutes of Exercise per Session: 30 min  Stress: Stress Concern Present (10/03/2022)   [employer] of Occupational Health - Occupational Stress Questionnaire    Feeling of Stress : To some extent  Social Connections: Socially Isolated (09/21/2021)   Social Connection and Isolation Panel [NHANES]    Frequency of Communication with Friends and Family: Three times a week    Frequency of Social Gatherings with Friends and Family: Three times a week    Attends Religious Services: Never    Active Member of Clubs or Organizations: No    Attends Banker Meetings: Not on file    Marital Status: Never married    Allergies:  Allergies  Allergen Reactions   Cat Hair Extract    Citric Acid Other (See Comments)    Runny nose, eyes water   Food     Oranges or anything with citric acid   Gabapentin     Walking into things, hard to maintain balance, falls    Metabolic Disorder Labs: Lab Results  Component Value Date   HGBA1C 8.4 (H) 11/09/2022   MPG 123 03/02/2018   No results found for: "PROLACTIN" Lab Results  Component Value Date   CHOL 115 11/09/2022   TRIG 60.0 11/09/2022   HDL 52.50 11/09/2022   CHOLHDL 2 11/09/2022   VLDL 12.0 11/09/2022   LDLCALC 50 11/09/2022    LDLCALC 58 07/26/2022   Lab Results  Component Value Date   TSH 1.02 07/08/2019   TSH 0.92 03/15/2018    Therapeutic Level Labs: No results found for: "LITHIUM" No results found for: "VALPROATE" No results found for: "CBMZ"  Current Medications: Current Outpatient Medications  Medication Sig Dispense Refill   ACCU-CHEK SOFTCLIX LANCETS lancets 3 (three) times daily. for testing  0   albuterol (VENTOLIN HFA) 108 (90 Base) MCG/ACT inhaler Inhale 1-2 puffs into the lungs every 6 (  six) hours as needed for wheezing or shortness of breath. 18 g 0   amantadine (SYMMETREL) 100 MG capsule Take 1 capsule (100 mg total) by mouth 2 (two) times daily. Take 2 capsules in the morning (200 mg) and one capsule (100 mg) in the evening     Blood Glucose Monitoring Suppl (ACCU-CHEK GUIDE ME) w/Device KIT USE AS DIRECTED 1 kit 0   Cariprazine HCl (VRAYLAR) 6 MG CAPS TAKE 1 CAPSULE(6 MG) BY MOUTH DAILY 30 capsule 1   [START ON 02/17/2023] clonazePAM (KLONOPIN) 1 MG tablet Take 1 tablet (1 mg total) by mouth 3 (three) times daily as needed for anxiety. for anxiety 90 tablet 0   cyanocobalamin (VITAMIN B12) 1000 MCG tablet Take 1,000 mcg by mouth daily.     fexofenadine (ALLEGRA ODT) 30 MG disintegrating tablet Take 30 mg by mouth daily.     fluPHENAZine (PROLIXIN) 5 MG tablet Take one tablet ( 5 mgr)  at bedtime 30 tablet 1   fluticasone (FLONASE) 50 MCG/ACT nasal spray SHAKE LIQUID AND USE 2 SPRAYS IN EACH NOSTRIL DAILY 16 g 6   glipiZIDE (GLUCOTROL) 10 MG tablet TAKE 1 TABLET(10 MG) BY MOUTH TWICE DAILY 180 tablet 3   glucose blood (ACCU-CHEK GUIDE) test strip 1 each by Other route in the morning, at noon, and at bedtime. Use to test glucose levels 3 times daily 200 strip 3   JARDIANCE 25 MG TABS tablet TAKE 1 TABLET BY MOUTH DAILY 90 tablet 2   metFORMIN (GLUCOPHAGE-XR) 500 MG 24 hr tablet TAKE 2 TABLETS(1000 MG) BY MOUTH TWICE DAILY 360 tablet 3   montelukast (SINGULAIR) 10 MG tablet TAKE 1 TABLET(10 MG)  BY MOUTH AT BEDTIME 90 tablet 3   RESTASIS 0.05 % ophthalmic emulsion      sertraline (ZOLOFT) 100 MG tablet Take 1.5 tablets (150 mg total) by mouth daily. 45 tablet 1   simvastatin (ZOCOR) 20 MG tablet TAKE 1 TABLET(20 MG) BY MOUTH EVERY EVENING 90 tablet 3   tadalafil (CIALIS) 20 MG tablet TAKE ONE-HALF TO ONE TABLET BY MOUTH EVERY OTHER DAY AS NEEDED FOR ERECTILE DYSFUNCTION 10 tablet 2   traZODone (DESYREL) 50 MG tablet Take 1 tablet (50 mg total) by mouth at bedtime as needed for sleep. 30 tablet 0   triamcinolone ointment (KENALOG) 0.1 % Apply a thin coat to rash on elbows twice daily 30 g 0   TYRVAYA 0.03 MG/ACT SOLN Spray 1 spray in each nostril twice daily, approximately 12 hours apart     Current Facility-Administered Medications  Medication Dose Route Frequency Provider Last Rate Last Admin   methylPREDNISolone acetate (DEPO-MEDROL) injection 80 mg  80 mg Other Once Tyrell Antonio, MD         Musculoskeletal: Strength & Muscle Tone: within normal limits Gait & Station: normal Patient leans: N/A  Psychiatric Specialty Exam: Review of Systems  Respiratory:  Negative for cough and shortness of breath.   Cardiovascular:  Negative for chest pain.  Gastrointestinal:  Negative for abdominal pain, constipation, diarrhea, nausea and vomiting.  Neurological:  Negative for dizziness, weakness and headaches.    There were no vitals taken for this visit.There is no height or weight on file to calculate BMI.  General Appearance: Casual and Fairly Groomed  Eye Contact:  Fair  Speech:  Clear and Coherent and Slow  Volume:  Normal  Mood:   "ok"  Affect:  Flat  Thought Process:  Coherent and Goal Directed  Orientation:  Full (Time, Place, and Person)  Thought Content: Paranoid Ideation   Suicidal Thoughts:  No  Homicidal Thoughts:  No  Memory:  Immediate;   Good Recent;   Good  Judgement:  Good  Insight:  Good  Psychomotor Activity:  Restlessness   Concentration:   Concentration: Good and Attention Span: Good  Recall:  Good  Fund of Knowledge: Good  Language: Good  Akathisia:   possible, reports restlessness  Handed:  Right  AIMS (if indicated): not done   Assets:  Communication Skills Desire for Improvement Financial Resources/Insurance Housing Resilience Social Support Vocational/Educational  ADL's:  Intact  Cognition: WNL  Sleep:  Good   Screenings: GAD-7    Garment/textile technologist Visit from 07/26/2022 in [facility] at [employer]  Total GAD-7 Score 5      PHQ2-9    Flowsheet Row Counselor from 12/29/2022 in [facility] Visit from 11/17/2022 in [facility] [employer] Visit from 11/09/2022 in [facility] [employer] Clinical Support from 10/03/2022 in [facility] [employer] Nutrition from 09/30/2022 in [facility] at [facility] Total Score 0 0 0 0 0      Flowsheet Row Counselor from 12/29/2022 in [facility] Counselor from 09/23/2022 in [facility] [place] Counselor from 08/12/2022 in [facility] at [facility] RISK CATEGORY No Risk No Risk No Risk        Assessment and Plan:  Glenn Robertson is a 57 yr old male who presents for Follow Up and Medication Management.  PPHx is significant for Paranoid Schizophrenia and MDD, and no history of Suicide Attempts or Psychiatric Hospitalizations.   Glenn Robertson continues to report issues with concentration but was able to improve his grades in school.  He does report issues with depression and anxiety which could be contributing to his issues with concentration so we will increase his Zoloft.  Since the increase in amantadine did not have any noticeable effect we will decrease that  back down to 100 mg twice a day.  He still does have some paranoia but is not seeing people following him like he used to.  We will not make any other changes to his medication at this time.  Refills were sent in.  He will return for follow-up in approximately 6 weeks.   Paranoid Schizophrenia: -Continue Vraylar 6 mg daily for paranoia and psychosis.  30 tablets with 1 refill. -Continue Fluphenazine 5 mg QHS for paranoia and psychosis.  30 tablets with 1 refill. -Decrease Amantadine to 100 mg AM and 100 mg QHS to minimize tremors /EPS.  270 (100 mg) tablets with 0 refills.   MDD  Anxiety: -Increase Zoloft 150 mg  daily for depression and anxiety.   45 (100 mg) tablets with 1 refill. -Continue Klonopin 1 mg TID PRN for anxiety.  90 tablets with 0 refills to be filled 6/21.   Insomnia: -Continue Trazodone 50 mg QHS PRN.  No refills sent at this time.    Collaboration of Care: Collaboration of Care: Other provider involved in patient's care AEB [facility] Therapist  Patient/Guardian was advised Release of Information must be obtained prior to any record release in order to collaborate their care with an outside provider. Patient/Guardian was advised if they have not already done so to contact the registration department to sign all necessary forms in  order for [place] to release information regarding their care.   Consent: Patient/Guardian gives verbal consent for treatment and assignment of benefits for services provided during this visit. Patient/Guardian expressed understanding and agreed to proceed.    Lauro Franklin, MD 02/02/2023, 10:06 AM

## 2023-02-08 ENCOUNTER — Other Ambulatory Visit: Payer: Self-pay | Admitting: Family Medicine

## 2023-02-16 ENCOUNTER — Ambulatory Visit (INDEPENDENT_AMBULATORY_CARE_PROVIDER_SITE_OTHER): Payer: Medicare Other | Admitting: Licensed Clinical Social Worker

## 2023-02-16 ENCOUNTER — Ambulatory Visit: Payer: Medicare Other | Admitting: Family Medicine

## 2023-02-16 DIAGNOSIS — F251 Schizoaffective disorder, depressive type: Secondary | ICD-10-CM | POA: Diagnosis not present

## 2023-02-16 NOTE — Progress Notes (Signed)
**Note Glenn-Identified via Obfuscation** Virtual Visit via Video Note  I connected with Glenn Robertson on 02/16/23 at  8:00 AM EDT by a video enabled telemedicine application and verified that I am speaking with the correct person using two identifiers.  Location: Patient: home Provider: remote office [place], [place])   I discussed the limitations of evaluation and management by telemedicine and the availability of in person appointments. The patient expressed understanding and agreed to proceed.   I discussed the assessment and treatment plan with the patient. The patient was provided an opportunity to ask questions and all were answered. The patient agreed with the plan and demonstrated an understanding of the instructions.   The patient was advised to call back or seek an in-person evaluation if the symptoms worsen or if the condition fails to improve as anticipated.  I provided 45 minutes of non-face-to-face time during this encounter.   Cezar Misiaszek R Farrah Skoda, LCSW   THERAPIST PROGRESS NOTE  Session Time: 2-245p  Participation Level: Active  Behavioral Response: Neat and Well GroomedAlertDepressed  Type of Therapy: Individual Therapy  Treatment Goals addressed: Develop healthy thinking patterns and beliefs about self, others, and the world that lead to the alleviation and help prevent the relapse of depression per pt report 3 out of 5 sessions documented  ProgressTowards Goals: Progressing  Interventions: CBT and Supportive  Summary: Glenn Robertson is a 57 y.o. male who presents with continuing symptoms related to schizoaffective disorder.  Pt reports that there was a point in time where he "lost and forgot about" his medications, and was noncompliant for an undisclosed period of time. 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 Pt states "it was a delusion of persecution". Praised pts self awareness but reinforced  how episodes could happen easily if pt is not compliant with medication. Pt retracted initial statement about losing his medication and stated that he has been compliant with his medication "just not the recently upped dose that the doctor sent in". Reviewed importance of organizing and labeling medication. Pt admits that he hides his medications, in case family members were to come into his home/room--he doesn't want others knowing what he is taking. Discussed prioritizing his health and well being over the thoughts of others--reinforced importance of organizing his medication and taking it daily.   Pt reports inconsistent quality/quantity of sleep--pt reports that he often will stay up late (2-3am) and will sleep late. Used motivational interviewing to promote positive sleep hygiene behaviors.   Explored patients overall focus on wellness.. Discussed physical wellness and included discussions about improving physical activity (at pts ability level)--pt states his balance has not been good so he is not riding his bike as much and is running instead.  and reviewed current nutritional choices and reviewed healthy nutritional choices. Discussed nutritional changes and diabetes management--pt states that he knows he could do better. Encouraged pt to make more nutritional choices and cut out processed foods.  Assisted pt with identifying stress associated with caring for their mother and her health. Pt reports that his mother doesn't drive so he is her primary method of transportation to doctor appointments and errands.  Continued recommendations are as follows: self care behaviors, positive social engagements, focusing on overall work/home/life balance, and focusing on positive physical and emotional wellness.   Suicidal/Homicidal: No  Therapist Response: Pt is continuing to apply interventions learned in session into daily life situations. Pt  is currently on track to meet goals utilizing interventions  mentioned above. Personal growth and progress noted. Treatment to continue as indicated.   Plan: Return again in 4 weeks.  Diagnosis:  Encounter Diagnosis  Name Primary?   Schizoaffective disorder, depressive type (HCC) Yes   Collaboration of Care: Other Pt encouraged to continue care with psychiatrist of record, Dr. Madaline Guthrie Cobos.   Patient/Guardian was advised Release of Information must be obtained prior to any record release in order to collaborate their care with an outside provider. Patient/Guardian was advised if they have not already done so to contact the registration department to sign all necessary forms in order for [place] to release information regarding their care.   Consent: Patient/Guardian gives verbal consent for treatment and assignment of benefits for services provided during this visit. Patient/Guardian expressed understanding and agreed to proceed.   Ernest Haber Aina Rossbach, LCSW 02/16/2023

## 2023-02-20 ENCOUNTER — Ambulatory Visit (INDEPENDENT_AMBULATORY_CARE_PROVIDER_SITE_OTHER): Payer: Medicare Other | Admitting: Family Medicine

## 2023-02-20 ENCOUNTER — Encounter: Payer: Self-pay | Admitting: Family Medicine

## 2023-02-20 VITALS — BP 102/66 | HR 72 | Temp 97.9°F | Ht 69.0 in | Wt 196.2 lb

## 2023-02-20 DIAGNOSIS — E538 Deficiency of other specified B group vitamins: Secondary | ICD-10-CM | POA: Diagnosis not present

## 2023-02-20 DIAGNOSIS — J45990 Exercise induced bronchospasm: Secondary | ICD-10-CM | POA: Insufficient documentation

## 2023-02-20 DIAGNOSIS — E1165 Type 2 diabetes mellitus with hyperglycemia: Secondary | ICD-10-CM

## 2023-02-20 DIAGNOSIS — R42 Dizziness and giddiness: Secondary | ICD-10-CM | POA: Diagnosis not present

## 2023-02-20 LAB — URINALYSIS, ROUTINE W REFLEX MICROSCOPIC
Bilirubin Urine: NEGATIVE
Hgb urine dipstick: NEGATIVE
Ketones, ur: NEGATIVE
Leukocytes,Ua: NEGATIVE
Nitrite: NEGATIVE
RBC / HPF: NONE SEEN (ref 0–?)
Specific Gravity, Urine: <=1.005 — AB (ref 1.000–1.030)
Total Protein, Urine: NEGATIVE
Urine Glucose: >=1000 — AB
Urobilinogen, UA: 0.2 (ref 0.0–1.0)
WBC, UA: NONE SEEN (ref 0–?)
pH: 6 (ref 5.0–8.0)

## 2023-02-20 LAB — MICROALBUMIN / CREATININE URINE RATIO
Creatinine,U: 56.7 mg/dL
Microalb Creat Ratio: 1.2 mg/g (ref 0.0–30.0)
Microalb, Ur: <0.7 mg/dL (ref 0.0–1.9)

## 2023-02-20 LAB — BASIC METABOLIC PANEL
BUN: 14 mg/dL (ref 6–23)
CO2: 23 mEq/L (ref 19–32)
Calcium: 9.6 mg/dL (ref 8.4–10.5)
Chloride: 104 mEq/L (ref 96–112)
Creatinine, Ser: 1.01 mg/dL (ref 0.40–1.50)
GFR: 83.16 mL/min (ref >60.00)
Glucose, Bld: 108 mg/dL — ABNORMAL HIGH (ref 70–99)
Potassium: 3.8 mEq/L (ref 3.5–5.1)
Sodium: 136 mEq/L (ref 135–145)

## 2023-02-20 LAB — VITAMIN B12: Vitamin B-12: 1150 pg/mL — ABNORMAL HIGH (ref 211–911)

## 2023-02-20 LAB — HEMOGLOBIN A1C: Hgb A1c MFr Bld: 7.4 % — ABNORMAL HIGH (ref 4.6–6.5)

## 2023-02-20 NOTE — Progress Notes (Signed)
Established Patient Office Visit   Subjective:  Patient ID: Glenn Robertson, male    DOB: [birth year]  Age: 57 y.o. MRN: [id number]  Chief Complaint  Patient presents with   Medical Management of Chronic Issues    F/u meds.  C/o having balance issues x 1 year.      HPI Encounter Diagnoses  Name Primary?   Uncontrolled type 2 diabetes mellitus with hyperglycemia (HCC) Yes   Vitamin B 12 deficiency    Lightheadedness    Exercise-induced asthma    For follow-up of above.  Tells me that measured glucose is been no more than 130.  His hemoglobin A1c's have been above 8.  He continues to consume carbohydrates like he did when he was able to run for exercise.  No longer able to run.  Explained that his sugars have been averaging at 180.  He is compliant with all of his medications.  Has talked about lightheadedness.  Using albuterol inhaler 3-4 times weekly prior to exercise.   ROS   Current Outpatient Medications:    ACCU-CHEK SOFTCLIX LANCETS lancets, 3 (three) times daily. for testing, Disp: , Rfl: 0   albuterol (VENTOLIN HFA) 108 (90 Base) MCG/ACT inhaler, Inhale 1-2 puffs into the lungs every 6 (six) hours as needed for wheezing or shortness of breath., Disp: 18 g, Rfl: 0   amantadine (SYMMETREL) 100 MG capsule, Take 1 capsule (100 mg total) by mouth 2 (two) times daily. Take 2 capsules in the morning (200 mg) and one capsule (100 mg) in the evening, Disp: , Rfl:    Blood Glucose Monitoring Suppl (ACCU-CHEK GUIDE ME) w/Device KIT, USE AS DIRECTED, Disp: 1 kit, Rfl: 0   Cariprazine HCl (VRAYLAR) 6 MG CAPS, TAKE 1 CAPSULE(6 MG) BY MOUTH DAILY, Disp: 30 capsule, Rfl: 1   clonazePAM (KLONOPIN) 1 MG tablet, Take 1 tablet (1 mg total) by mouth 3 (three) times daily as needed for anxiety. for anxiety, Disp: 90 tablet, Rfl: 0   fexofenadine (ALLEGRA ODT) 30 MG disintegrating tablet, Take 30 mg by mouth daily., Disp: , Rfl:    fluPHENAZine (PROLIXIN) 5 MG tablet, Take one tablet ( 5 mgr)  at  bedtime, Disp: 30 tablet, Rfl: 1   fluticasone (FLONASE) 50 MCG/ACT nasal spray, SHAKE LIQUID AND USE 2 SPRAYS IN EACH NOSTRIL DAILY, Disp: 16 g, Rfl: 6   glipiZIDE (GLUCOTROL) 10 MG tablet, TAKE 1 TABLET(10 MG) BY MOUTH TWICE DAILY, Disp: 180 tablet, Rfl: 3   glucose blood (ACCU-CHEK GUIDE) test strip, 1 each by Other route in the morning, at noon, and at bedtime. Use to test glucose levels 3 times daily, Disp: 200 strip, Rfl: 3   JARDIANCE 25 MG TABS tablet, TAKE 1 TABLET BY MOUTH DAILY, Disp: 90 tablet, Rfl: 2   metFORMIN (GLUCOPHAGE-XR) 500 MG 24 hr tablet, TAKE 2 TABLETS(1000 MG) BY MOUTH TWICE DAILY, Disp: 360 tablet, Rfl: 3   montelukast (SINGULAIR) 10 MG tablet, TAKE 1 TABLET(10 MG) BY MOUTH AT BEDTIME, Disp: 90 tablet, Rfl: 3   sertraline (ZOLOFT) 100 MG tablet, Take 1.5 tablets (150 mg total) by mouth daily., Disp: 45 tablet, Rfl: 1   simvastatin (ZOCOR) 20 MG tablet, TAKE 1 TABLET(20 MG) BY MOUTH EVERY EVENING, Disp: 90 tablet, Rfl: 3   tadalafil (CIALIS) 20 MG tablet, TAKE ONE-HALF TO ONE TABLET BY MOUTH ONE TIME EVERY OTHER DAY AS NEEDED FOR ERECTILE DYSFUNCTION, Disp: 10 tablet, Rfl: 2   traZODone (DESYREL) 50 MG tablet, Take 1 tablet (50 mg  total) by mouth at bedtime as needed for sleep., Disp: 30 tablet, Rfl: 0   TYRVAYA 0.03 MG/ACT SOLN, Spray 1 spray in each nostril twice daily, approximately 12 hours apart, Disp: , Rfl:   Current Facility-Administered Medications:    methylPREDNISolone acetate (DEPO-MEDROL) injection 80 mg, 80 mg, Other, Once, Tyrell Antonio, MD   Objective:     BP 102/66   Pulse 72   Temp 97.9 F (36.6 C) (Temporal)   Ht 5\' 9"  (1.753 m)   Wt 196 lb 3.2 oz (89 kg)   SpO2 97%   BMI 28.97 kg/m  BP Readings from Last 3 Encounters:  02/20/23 102/66  11/17/22 116/68  11/09/22 108/68   Wt Readings from Last 3 Encounters:  02/20/23 196 lb 3.2 oz (89 kg)  11/17/22 197 lb (89.4 kg)  11/09/22 196 lb (88.9 kg)      Physical Exam   No results found  for any visits on 02/20/23.    The ASCVD Risk score (Arnett DK, et al., 2019) failed to calculate for the following reasons:   The valid total cholesterol range is 130 to 320 mg/dL    Assessment & Plan:   Uncontrolled type 2 diabetes mellitus with hyperglycemia (HCC) -     Basic metabolic panel -     Hemoglobin A1c -     Urinalysis, Routine w reflex microscopic -     Microalbumin / creatinine urine ratio -     Ambulatory referral to Ophthalmology  Vitamin B 12 deficiency -     Vitamin B12  Lightheadedness -     Basic metabolic panel -     Urinalysis, Routine w reflex microscopic  Exercise-induced asthma    Return in about 3 months (around 05/23/2023), or May stop high-dose B12 and take multivitamin please.  Will double down on efforts to decrease carbohydrate intake.  Work on increasing hydration.  Continue exercising by walking.  May refer to endocrinology and/or pulmonary.  Suspect that lightheadedness may be due to dehydration.  Would have to consider psychotropics.  Blood pressure is low today.   Mliss Sax, MD

## 2023-02-21 ENCOUNTER — Telehealth: Payer: Self-pay | Admitting: Family Medicine

## 2023-02-21 NOTE — Telephone Encounter (Signed)
Demographic sheet sent

## 2023-02-21 NOTE — Telephone Encounter (Signed)
Ashley/ Dr Barbaraann Barthel office [phone]   They have requested the demographics for this pt before they can schedule.

## 2023-02-21 NOTE — Telephone Encounter (Signed)
Glenn Robertson from [facility] called and stated that the pt has found another dr. Isidore Moos to go to and the pt decline the referral

## 2023-02-21 NOTE — Telephone Encounter (Signed)
Referral closed

## 2023-02-22 DIAGNOSIS — H40023 Open angle with borderline findings, high risk, bilateral: Secondary | ICD-10-CM | POA: Diagnosis not present

## 2023-02-22 DIAGNOSIS — E119 Type 2 diabetes mellitus without complications: Secondary | ICD-10-CM | POA: Diagnosis not present

## 2023-02-22 DIAGNOSIS — H5213 Myopia, bilateral: Secondary | ICD-10-CM | POA: Diagnosis not present

## 2023-02-22 LAB — HM DIABETES EYE EXAM

## 2023-03-08 ENCOUNTER — Ambulatory Visit (INDEPENDENT_AMBULATORY_CARE_PROVIDER_SITE_OTHER): Payer: Medicare Other | Admitting: Licensed Clinical Social Worker

## 2023-03-08 DIAGNOSIS — Z636 Dependent relative needing care at home: Secondary | ICD-10-CM

## 2023-03-08 DIAGNOSIS — F251 Schizoaffective disorder, depressive type: Secondary | ICD-10-CM | POA: Diagnosis not present

## 2023-03-08 NOTE — Patient Instructions (Addendum)
Outpatient Psychiatry and Counseling  FOR CRISIS:  call 911, [facility]: Mobile Crisis Management 24 hours:  [phone], call [phone], [facility] ([facility]) [address] walk in, or go to your local EMERGENCY DEPARTMENT  [facility] [address], [place], [place] [address]  [phone]  [facility] [address] [place], [place] [address] [phone]  [facility] Address: [address] [place] [address], [place], [place] [address] Phone: [phone]  [facility] ([place] Locations) [email or web address]  [facility] sliding scale fee and walk in schedule: M-F 8am-12pm/1pm-3pm [address]  [place], [place] [address] [phone]  [facility] [address] [place], [place] [address] [phone]  [facility]/ Intensive Outpatient Therapy Program/CDIOP/PHP [address] [place], [place] [address] [phone]  [facility]                  Crisis Services, Outpatient Therapy Services, Walk in Services      [phone]     [address]    [place], [place] [address]                 [facility]   [facility] [phone] [address] [place], [place] [address]  [facility]          [address],  [place], [place] [address]       [phone]  [facility] [address] [place], [place] [address] [phone]  [facility]    [address]    [place], [place] [address]     [phone]       [facility] [address] [place] [address]  [facility]     [address]     [place], [place]      [phone]       [facility] Shamsher Ahluwalia, MD [address]  [address] [place], [place] [address] [phone]  [facility]     [address]     [place], [place] [address]     [phone]       [facility] [address] , [place] [address] [phone] Resources for Temporary Residential Assistance/Crisis Centers  

## 2023-03-08 NOTE — Progress Notes (Addendum)
Virtual Visit via Video Note  I connected with [name] on 03/08/23 at  8:00 AM EDT by a video enabled telemedicine application and verified that I am speaking with the correct person using two identifiers.  Location: Patient: home Provider: [facility]   I discussed the limitations of evaluation and management by telemedicine and the availability of in person appointments. The patient expressed understanding and agreed to proceed.   I discussed the assessment and treatment plan with the patient. The patient was provided an opportunity to ask questions and all were answered. The patient agreed with the plan and demonstrated an understanding of the instructions.   The patient was advised to call back or seek an in-person evaluation if the symptoms worsen or if the condition fails to improve as anticipated.  I provided 40 minutes of non-face-to-face time during this encounter.   Ikey Omary R Jakyria Bleau, LCSW   THERAPIST PROGRESS NOTE  Session Time: [id number]  Participation Level: Active  Behavioral Response: Neat and Well GroomedAlertDepressed  Type of Therapy: Individual Therapy  Treatment Goals addressed: Develop healthy thinking patterns and beliefs about self, others, and the world that lead to the alleviation and help prevent the relapse of depression per pt report 3 out of 5 sessions documented  ProgressTowards Goals: Progressing  Interventions: CBT and Supportive  Summary: Glenn Robertson is a 57 y.o. male who presents with continuing symptoms related to schizoaffective disorder.    Clinician assisted pt with identifying situations/scenarios/schemas triggering anxiety and/or depression symptoms. Allowed pt to explore and express thoughts and feelings and discussed current coping mechanisms. Reviewed changes/recommendations. Pt feels that being primary caregiver to his mother is one of his biggest external stressors. Allowed pt space to  explore specific triggers and assisted pt with identifying coping skills that are helpful for managing stress in the moment.   Pt denies any significant episodes of depression or any anger episodes. Pt feels that they are managing situational stressors well. Pt reports that sleep quality and quantity is good and pt reporting normal appetite. Pt feels that they are doing a good job of using coping skills in the moment. Pt does not have any other questions or concerns to discuss at today's session.   Continued recommendations are as follows: self care behaviors, positive social engagements, focusing on overall work/home/life balance, and focusing on positive physical and emotional wellness. Pt states he enjoys walking and photography.  Suicidal/Homicidal: No  Therapist Response: Pt is continuing to apply interventions learned in session into daily life situations. Pt is currently on track to meet goals utilizing interventions mentioned above. Personal growth and progress noted. Treatment to continue as indicated.   Plan: Informed patient that clinician will be leaving outpatient department. Allowed pt to explore any questions or concerns and discussed future counseling options/resources. Provided pt with psychoeducational resources and list of OPT therapists. Encouraged pt to continue with psychiatric med management appointments, if applicable.    Diagnosis:  Encounter Diagnoses  Name Primary?   Schizoaffective disorder, depressive type (HCC) Yes   Caregiver stress     Collaboration of Care: Other Pt encouraged to continue care with psychiatrist of record, Dr. Madaline Guthrie Cobos.   Patient/Guardian was advised Release of Information must be obtained prior to any record release in order to collaborate their care with an outside provider. Patient/Guardian was advised if they have not already done so to contact the registration department to sign all necessary forms in order for [place] to release  information regarding  their care.   Consent: Patient/Guardian gives verbal consent for treatment and assignment of benefits for services provided during this visit. Patient/Guardian expressed understanding and agreed to proceed.   Ernest Haber Marjarie Irion, LCSW 03/08/2023

## 2023-03-13 ENCOUNTER — Encounter: Payer: Self-pay | Admitting: Orthopaedic Surgery

## 2023-03-14 DIAGNOSIS — M7989 Other specified soft tissue disorders: Secondary | ICD-10-CM | POA: Diagnosis not present

## 2023-03-14 DIAGNOSIS — M21611 Bunion of right foot: Secondary | ICD-10-CM | POA: Diagnosis not present

## 2023-03-14 DIAGNOSIS — L600 Ingrowing nail: Secondary | ICD-10-CM | POA: Diagnosis not present

## 2023-03-14 NOTE — Telephone Encounter (Signed)
noted 

## 2023-03-19 ENCOUNTER — Encounter (HOSPITAL_COMMUNITY): Payer: Self-pay

## 2023-03-19 ENCOUNTER — Ambulatory Visit (HOSPITAL_COMMUNITY)
Admission: EM | Admit: 2023-03-19 | Discharge: 2023-03-19 | Disposition: A | Discharge status: Home / Self Care | Payer: Medicare Other | Attending: Emergency Medicine | Admitting: Emergency Medicine

## 2023-03-19 DIAGNOSIS — L97529 Non-pressure chronic ulcer of other part of left foot with unspecified severity: Secondary | ICD-10-CM | POA: Diagnosis not present

## 2023-03-19 DIAGNOSIS — E11621 Type 2 diabetes mellitus with foot ulcer: Secondary | ICD-10-CM | POA: Diagnosis not present

## 2023-03-19 MED ORDER — MUPIROCIN 2 % EX OINT: 1.0000 | TOPICAL_OINTMENT | Freq: Every day | CUTANEOUS | 0 refills | Status: AC

## 2023-03-19 MED ORDER — CHLORHEXIDINE GLUCONATE 4 % EX SOLN: Freq: Every day | CUTANEOUS | 0 refills | Status: AC | PRN

## 2023-03-19 NOTE — Discharge Instructions (Signed)
Today you are evaluated for the ulceration on the toe, at this time there are no signs of infection and the tissue appears to be healthy, should heal with time  Continue to take antibiotics as prescribed  Cleanse daily with soap, antiseptic solution and water during her normal hygiene, pat do not rub,   Once oral antibiotic pills have been completed, apply topical antibiotic ointment and cover with a nonstick Band-Aid  You may use over-the-counter callus padding which may be found in the footcare section to help with pain as this provides cushioning and prevents pressure over the area  You may continue activity for which you are able to tolerate, you may take Tylenol every 6 hours as needed for pain  You may have your podiatrist recheck the ulcer at your upcoming appointment in 1 to 2 weeks

## 2023-03-19 NOTE — ED Triage Notes (Signed)
Pt has diabetes 

## 2023-03-19 NOTE — ED Provider Notes (Signed)
[facility]    CSN: [id number] Arrival date & time: 03/19/23  1422      History   Chief Complaint Chief Complaint  Patient presents with   Nail Problem    HPI Glenn Robertson is a 57 y.o. male.   Patient presents for evaluation of a wound to the left great toe noticed 3 to 4 days ago after removal of bandage.  Bandage placed by podiatry after ingrown toenail removal on the left great toe.  Denies drainage at the site.  Mild pain when pressure is applied.  Denies presence of fever.  Currently taking antibiotic for ingrown toenail.  History of diabetic neuropathy.  Past Medical History:  Diagnosis Date   Allergy    dog and cats, citrus   Anxiety    Asthma    Chronic pain    Depression    Diabetes mellitus    Glaucoma    Neuromuscular disorder (HCC)    Paranoid schizophrenia (HCC)     Patient Active Problem List   Diagnosis Date Noted   Exercise-induced asthma 02/20/2023   Lightheadedness 02/20/2023   Contact dermatitis due to drugs in contact with skin 11/17/2022   Uncontrolled type 2 diabetes mellitus with hyperglycemia (HCC) 09/21/2022   Burn erythema of axilla 07/19/2022   Xerosis of skin 07/19/2022   Erythrasma 07/19/2022   Intertrigo 07/05/2022   Tinea pedis of both feet 07/05/2022   Nondisplaced fracture of distal phalanx of right great toe, initial encounter for closed fracture 06/28/2022   Inclusion cyst 10/07/2021   Gait disturbance 10/04/2021   Olecranon bursitis of left elbow 05/31/2021   Allergic rhinitis 01/18/2021   Skin lesion of left ear 01/18/2021   Abrasion of left pinna 12/25/2020   Abrasion of left knee 12/25/2020   Tremor due to multiple drugs 10/02/2020   Dermatitis 10/02/2020   Polyneuropathy 05/12/2020   Elevated cholesterol 10/08/2019   Benign prostatic hyperplasia with nocturia 10/08/2019   Schizoaffective disorder, depressive type (HCC) 11/27/2018   Urinary hesitancy 10/08/2018   Erectile dysfunction 09/24/2018    Numbness 08/20/2018   Transient alteration of awareness 08/08/2018   Tendinopathy of right gluteus medius 05/22/2018   Pain of right hip joint 05/08/2018   Hearing difficulty 04/13/2018   Trochanteric bursitis, right hip 03/23/2018   Reactive airway disease 03/16/2018   Headache 03/02/2018   Type 2 diabetes mellitus without complication, without long-term current use of insulin (HCC) 02/16/2018   Vitamin B 12 deficiency 02/16/2018   Screening for prostate cancer 02/16/2018   Pain in left leg 10/28/2016   Lumbosacral radiculopathy at S1 03/15/2016   Lumbar degenerative disc disease 12/18/2015   Disc displacement, lumbar 04/21/2015   Chondromalacia of both patellae 02/24/2015   Paranoid schizophrenia, chronic condition (HCC) 02/24/2015   Right anterior knee pain 07/18/2014   Right ankle pain 01/27/2014   Pain in joint, ankle and foot 01/27/2014   Sciatica 01/10/2012   Disturbance of skin sensation 11/14/2011    Past Surgical History:  Procedure Laterality Date   ANKLE ARTHROSCOPY     Arm Surgery  1/12   rt ulnar nerve decompression   EPIDURAL BLOCK INJECTION     several   ULNAR NERVE TRANSPOSITION  01/19/2012   Procedure: ULNAR NERVE DECOMPRESSION/TRANSPOSITION;  Surgeon: Wyn Forster., MD;  Location: [facility];  Service: Orthopedics;  Laterality: Left;  ulnar nerve decompression vs transposition left elbow       Home Medications    Prior to Admission medications  Medication Sig Start Date End Date Taking? Authorizing Provider  ACCU-CHEK SOFTCLIX LANCETS lancets 3 (three) times daily. for testing 10/11/17  Yes [provider]  albuterol (VENTOLIN HFA) 108 (90 Base) MCG/ACT inhaler Inhale 1-2 puffs into the lungs every 6 (six) hours as needed for wheezing or shortness of breath. 12/06/22  Yes Mliss Sax, MD  amantadine (SYMMETREL) 100 MG capsule Take 1 capsule (100 mg total) by mouth 2 (two) times daily. Take 2 capsules in the morning  (200 mg) and one capsule (100 mg) in the evening 02/02/23  Yes Pashayan, Mardelle Matte, MD  Blood Glucose Monitoring Suppl (ACCU-CHEK GUIDE ME) w/Device KIT USE AS DIRECTED 01/19/23  Yes Mliss Sax, MD  Cariprazine HCl (VRAYLAR) 6 MG CAPS TAKE 1 CAPSULE(6 MG) BY MOUTH DAILY 02/02/23  Yes Pashayan, Mardelle Matte, MD  clonazePAM (KLONOPIN) 1 MG tablet Take 1 tablet (1 mg total) by mouth 3 (three) times daily as needed for anxiety. for anxiety 02/17/23  Yes Pashayan, Mardelle Matte, MD  fexofenadine (ALLEGRA ODT) 30 MG disintegrating tablet Take 30 mg by mouth daily.   Yes [provider]  fluPHENAZine (PROLIXIN) 5 MG tablet Take one tablet ( 5 mgr)  at bedtime 02/02/23  Yes Pashayan, Mardelle Matte, MD  fluticasone [facility]) 50 MCG/ACT nasal spray SHAKE LIQUID AND USE 2 SPRAYS IN [facility] NOSTRIL DAILY 09/02/22  Yes Mliss Sax, MD  glipiZIDE (GLUCOTROL) 10 MG tablet TAKE 1 TABLET(10 MG) BY MOUTH TWICE DAILY 07/26/22  Yes Mliss Sax, MD  glucose blood (ACCU-CHEK GUIDE) test strip 1 each by Other route in the morning, at noon, and at bedtime. Use to test glucose levels 3 times daily 10/05/22  Yes Mliss Sax, MD  JARDIANCE 25 MG TABS tablet TAKE 1 TABLET BY MOUTH DAILY 11/18/22  Yes Mliss Sax, MD  metFORMIN (GLUCOPHAGE-XR) 500 MG 24 hr tablet TAKE 2 TABLETS(1000 MG) BY MOUTH TWICE DAILY 07/26/22  Yes Mliss Sax, MD  montelukast (SINGULAIR) 10 MG tablet TAKE 1 TABLET(10 MG) BY MOUTH AT BEDTIME 07/04/22  Yes Mliss Sax, MD  sertraline (ZOLOFT) 100 MG tablet Take 1.5 tablets (150 mg total) by mouth daily. 02/02/23  Yes Lauro Franklin, MD  simvastatin (ZOCOR) 20 MG tablet TAKE 1 TABLET(20 MG) BY MOUTH EVERY EVENING 07/27/22  Yes Mliss Sax, MD  tadalafil (CIALIS) 20 MG tablet TAKE ONE-HALF TO ONE TABLET BY MOUTH ONE TIME EVERY OTHER DAY AS NEEDED FOR ERECTILE DYSFUNCTION 02/08/23  Yes Mliss Sax, MD  traZODone  (DESYREL) 50 MG tablet Take 1 tablet (50 mg total) by mouth at bedtime as needed for sleep. 12/21/22  Yes Pashayan, Mardelle Matte, MD  TYRVAYA 0.03 MG/ACT SOLN Spray 1 spray in each nostril twice daily, approximately 12 hours apart 10/22/20  Yes [provider]  pregabalin (LYRICA) 100 MG capsule Take 100 mg by mouth daily.  11/14/11  [provider]    Family History Family History  Problem Relation Age of Onset   Diabetes Father    Diabetes Paternal Uncle    Colon cancer Neg Hx    Colon polyps Neg Hx    Esophageal cancer Neg Hx    Rectal cancer Neg Hx    Stomach cancer Neg Hx     Social History Social History   Tobacco Use   Smoking status: Never   Smokeless tobacco: Never  Vaping Use   Vaping status: Never Used  Substance Use Topics   Alcohol use: No  Drug use: No     Allergies   Cat hair extract, Citric acid, Food, and Gabapentin   Review of Systems Review of Systems   Physical Exam Triage Vital Signs ED Triage Vitals  Encounter Vitals Group     BP 03/19/23 1449 106/75     Systolic BP Percentile --      Diastolic BP Percentile --      Pulse Rate 03/19/23 1449 87     Resp 03/19/23 1449 16     Temp 03/19/23 1449 (!) 97.5 F (36.4 C)     Temp Source 03/19/23 1449 Oral     SpO2 03/19/23 1449 97 %     Weight --      Height --      Head Circumference --      Peak Flow --      Pain Score 03/19/23 1454 2     Pain Loc --      Pain Education --      Exclude from Growth Chart --    No data found.  Updated Vital Signs BP 106/75 (BP Location: Left Arm)   Pulse 87   Temp (!) 97.5 F (36.4 C) (Oral)   Resp 16   SpO2 97%   Visual Acuity Right Eye Distance:   Left Eye Distance:   Bilateral Distance:    Right Eye Near:   Left Eye Near:    Bilateral Near:     Physical Exam Constitutional:      Appearance: Normal appearance.  Eyes:     Extraocular Movements: Extraocular movements intact.  Pulmonary:     Effort: Pulmonary effort is  normal.  Skin:    Comments: 1 x 1 cm ulceration present to the plantar aspect of the left great toe, pink granulation tissue to the wound, no drainage noted, mildly tender, no acute erythema or swelling to the surrounding tissue  Neurological:     Mental Status: He is alert.      UC Treatments / Results  Labs (all labs ordered are listed, but only abnormal results are displayed) Labs Reviewed - No data to display  EKG   Radiology No results found.  Procedures Procedures (including critical care time)  Medications Ordered in UC Medications - No data to display  Initial Impression / Assessment and Plan / UC Course  I have reviewed the triage vital signs and the nursing notes.  Pertinent labs & imaging results that were available during my care of the patient were reviewed by me and considered in my medical decision making (see chart for details).  Diabetic ulcer of the left great toe  No signs of infection, granulation tissue appears to be healthy, discussed with patient, cleansed in office with chlorhexidine, Bactroban applied and covered with a nonadherent dressing, advised same wound care at home, cleanser and ointment sent to pharmacy, advised to continue oral antibiotic course as prescribed, may use callus padding for comfort as well is over-the-counter Tylenol, recommended reevaluation in 1 to 2 weeks, endorses he has podiatry follow-up at that time Final Clinical Impressions(s) / UC Diagnoses   Final diagnoses:  None   Discharge Instructions   None    ED Prescriptions   None    PDMP not reviewed this encounter.   Valinda Hoar, [place] 03/19/23 [phone]

## 2023-03-19 NOTE — ED Triage Notes (Signed)
Pt presents to the office with L-big toe injury.

## 2023-03-22 DIAGNOSIS — L97512 Non-pressure chronic ulcer of other part of right foot with fat layer exposed: Secondary | ICD-10-CM | POA: Diagnosis not present

## 2023-03-27 DIAGNOSIS — L97512 Non-pressure chronic ulcer of other part of right foot with fat layer exposed: Secondary | ICD-10-CM | POA: Diagnosis not present

## 2023-03-31 ENCOUNTER — Encounter (HOSPITAL_COMMUNITY): Payer: Self-pay

## 2023-03-31 ENCOUNTER — Ambulatory Visit (HOSPITAL_COMMUNITY)
Admission: EM | Admit: 2023-03-31 | Discharge: 2023-03-31 | Disposition: A | Discharge status: Home / Self Care | Payer: Medicare Other | Attending: Emergency Medicine | Admitting: Emergency Medicine

## 2023-03-31 DIAGNOSIS — L97529 Non-pressure chronic ulcer of other part of left foot with unspecified severity: Secondary | ICD-10-CM | POA: Diagnosis not present

## 2023-03-31 DIAGNOSIS — R059 Cough, unspecified: Secondary | ICD-10-CM

## 2023-03-31 DIAGNOSIS — E11621 Type 2 diabetes mellitus with foot ulcer: Secondary | ICD-10-CM

## 2023-03-31 MED ORDER — FAMOTIDINE 20 MG PO TABS: 20.0000 mg | ORAL_TABLET | Freq: Two times a day (BID) | ORAL | 0 refills | Status: DC

## 2023-03-31 MED ORDER — FAMOTIDINE 20 MG PO TABS: 20.0000 mg | ORAL_TABLET | Freq: Once | ORAL | Status: DC

## 2023-03-31 NOTE — ED Provider Notes (Signed)
[facility]    CSN: [id number] Arrival date & time: 03/31/23  1602      History   Chief Complaint Chief Complaint  Patient presents with   Cough    HPI Glenn Robertson is a 57 y.o. male. He reports one week of dry cough. He thinks it is worse at night and in the morning. Denies feeling sick. Denies any other illness symptoms like congestion or sore throat. Cough is not productive.  Pt does have reflux for which he frequently/daily takes Tums but it hasn't been bothering him as much lately so he hasn't been taking his usual Tums dose. Denies SOB or wheezing. Has ashtma and using his inhaler has not helped his cough. Cough medicine and cough drops also don't help.   Also wants someone to look at his toe ulcer. Has podiatry f/u in a week or two, isn't worried about ulcer but since he is here, wants someone to check it   Cough   Past Medical History:  Diagnosis Date   Allergy    dog and cats, citrus   Anxiety    Asthma    Chronic pain    Depression    Diabetes mellitus    Glaucoma    Neuromuscular disorder (HCC)    Paranoid schizophrenia (HCC)     Patient Active Problem List   Diagnosis Date Noted   Exercise-induced asthma 02/20/2023   Lightheadedness 02/20/2023   Contact dermatitis due to drugs in contact with skin 11/17/2022   Uncontrolled type 2 diabetes mellitus with hyperglycemia (HCC) 09/21/2022   Burn erythema of axilla 07/19/2022   Xerosis of skin 07/19/2022   Erythrasma 07/19/2022   Intertrigo 07/05/2022   Tinea pedis of both feet 07/05/2022   Nondisplaced fracture of distal phalanx of right great toe, initial encounter for closed fracture 06/28/2022   Inclusion cyst 10/07/2021   Gait disturbance 10/04/2021   Olecranon bursitis of left elbow 05/31/2021   Allergic rhinitis 01/18/2021   Skin lesion of left ear 01/18/2021   Abrasion of left pinna 12/25/2020   Abrasion of left knee 12/25/2020   Tremor due to multiple drugs 10/02/2020   Dermatitis  10/02/2020   Polyneuropathy 05/12/2020   Elevated cholesterol 10/08/2019   Benign prostatic hyperplasia with nocturia 10/08/2019   Schizoaffective disorder, depressive type (HCC) 11/27/2018   Urinary hesitancy 10/08/2018   Erectile dysfunction 09/24/2018   Numbness 08/20/2018   Transient alteration of awareness 08/08/2018   Tendinopathy of right gluteus medius 05/22/2018   Pain of right hip joint 05/08/2018   Hearing difficulty 04/13/2018   Trochanteric bursitis, right hip 03/23/2018   Reactive airway disease 03/16/2018   Headache 03/02/2018   Type 2 diabetes mellitus without complication, without long-term current use of insulin (HCC) 02/16/2018   Vitamin B 12 deficiency 02/16/2018   Screening for prostate cancer 02/16/2018   Pain in left leg 10/28/2016   Lumbosacral radiculopathy at S1 03/15/2016   Lumbar degenerative disc disease 12/18/2015   Disc displacement, lumbar 04/21/2015   Chondromalacia of both patellae 02/24/2015   Paranoid schizophrenia, chronic condition (HCC) 02/24/2015   Right anterior knee pain 07/18/2014   Right ankle pain 01/27/2014   Pain in joint, ankle and foot 01/27/2014   Sciatica 01/10/2012   Disturbance of skin sensation 11/14/2011    Past Surgical History:  Procedure Laterality Date   ANKLE ARTHROSCOPY     Arm Surgery  1/12   rt ulnar nerve decompression   EPIDURAL BLOCK INJECTION     several  ULNAR NERVE TRANSPOSITION  01/19/2012   Procedure: ULNAR NERVE DECOMPRESSION/TRANSPOSITION;  Surgeon: Wyn Forster., MD;  Location: [facility];  Service: Orthopedics;  Laterality: Left;  ulnar nerve decompression vs transposition left elbow       Home Medications    Prior to Admission medications   Medication Sig Start Date End Date Taking? Authorizing Provider  famotidine (PEPCID) 20 MG tablet Take 1 tablet (20 mg total) by mouth 2 (two) times daily. 03/31/23  Yes Cathlyn Parsons, NP  ACCU-CHEK SOFTCLIX LANCETS lancets 3 (three)  times daily. for testing 10/11/17   [provider]  albuterol (VENTOLIN HFA) 108 (90 Base) MCG/ACT inhaler Inhale 1-2 puffs into the lungs every 6 (six) hours as needed for wheezing or shortness of breath. 12/06/22   Mliss Sax, MD  amantadine (SYMMETREL) 100 MG capsule Take 1 capsule (100 mg total) by mouth 2 (two) times daily. Take 2 capsules in the morning (200 mg) and one capsule (100 mg) in the evening 02/02/23   Lauro Franklin, MD  Blood Glucose Monitoring Suppl (ACCU-CHEK GUIDE ME) w/Device KIT USE AS DIRECTED 01/19/23   Mliss Sax, MD  Cariprazine HCl (VRAYLAR) 6 MG CAPS TAKE 1 CAPSULE(6 MG) BY MOUTH DAILY 02/02/23   Lauro Franklin, MD  chlorhexidine (HIBICLENS) 4 % external liquid Apply topically daily as needed. 03/19/23   White, Elita Boone, NP  clonazePAM (KLONOPIN) 1 MG tablet Take 1 tablet (1 mg total) by mouth 3 (three) times daily as needed for anxiety. for anxiety 02/17/23   Lauro Franklin, MD  fexofenadine (ALLEGRA ODT) 30 MG disintegrating tablet Take 30 mg by mouth daily.    [provider]  fluPHENAZine (PROLIXIN) 5 MG tablet Take one tablet ( 5 mgr)  at bedtime 02/02/23   Lauro Franklin, MD  fluticasone [facility]) 50 MCG/ACT nasal spray SHAKE LIQUID AND USE 2 SPRAYS IN [facility] NOSTRIL DAILY 09/02/22   Mliss Sax, MD  glipiZIDE (GLUCOTROL) 10 MG tablet TAKE 1 TABLET(10 MG) BY MOUTH TWICE DAILY 07/26/22   Mliss Sax, MD  glucose blood (ACCU-CHEK GUIDE) test strip 1 each by Other route in the morning, at noon, and at bedtime. Use to test glucose levels 3 times daily 10/05/22   Mliss Sax, MD  JARDIANCE 25 MG TABS tablet TAKE 1 TABLET BY MOUTH DAILY 11/18/22   Mliss Sax, MD  metFORMIN (GLUCOPHAGE-XR) 500 MG 24 hr tablet TAKE 2 TABLETS(1000 MG) BY MOUTH TWICE DAILY 07/26/22   Mliss Sax, MD  montelukast (SINGULAIR) 10 MG tablet TAKE 1 TABLET(10 MG) BY MOUTH AT BEDTIME 07/04/22    Mliss Sax, MD  mupirocin ointment (BACTROBAN) 2 % Apply 1 Application topically daily. 03/19/23   Valinda Hoar, NP  sertraline (ZOLOFT) 100 MG tablet Take 1.5 tablets (150 mg total) by mouth daily. 02/02/23   Lauro Franklin, MD  simvastatin (ZOCOR) 20 MG tablet TAKE 1 TABLET(20 MG) BY MOUTH EVERY EVENING 07/27/22   Mliss Sax, MD  tadalafil (CIALIS) 20 MG tablet TAKE ONE-HALF TO ONE TABLET BY MOUTH ONE TIME EVERY OTHER DAY AS NEEDED FOR ERECTILE DYSFUNCTION 02/08/23   Mliss Sax, MD  traZODone (DESYREL) 50 MG tablet Take 1 tablet (50 mg total) by mouth at bedtime as needed for sleep. 12/21/22   Lauro Franklin, MD  TYRVAYA 0.03 MG/ACT SOLN Spray 1 spray in each nostril twice daily, approximately 12 hours apart 10/22/20   [provider]  pregabalin (LYRICA) 100 MG capsule Take 100 mg by mouth daily.  11/14/11  [provider]    Family History Family History  Problem Relation Age of Onset   Diabetes Father    Diabetes Paternal Uncle    Colon cancer Neg Hx    Colon polyps Neg Hx    Esophageal cancer Neg Hx    Rectal cancer Neg Hx    Stomach cancer Neg Hx     Social History Social History   Tobacco Use   Smoking status: Never   Smokeless tobacco: Never  Vaping Use   Vaping status: Never Used  Substance Use Topics   Alcohol use: No   Drug use: No     Allergies   Cat hair extract, Citric acid, Food, and Gabapentin   Review of Systems Review of Systems  Respiratory:  Positive for cough.      Physical Exam Triage Vital Signs ED Triage Vitals  Encounter Vitals Group     BP 03/31/23 1723 123/78     Systolic BP Percentile --      Diastolic BP Percentile --      Pulse Rate 03/31/23 1723 75     Resp 03/31/23 1723 16     Temp 03/31/23 1723 98 F (36.7 C)     Temp Source 03/31/23 1723 Oral     SpO2 03/31/23 1723 97 %     Weight 03/31/23 1722 195 lb (88.5 kg)     Height 03/31/23 1722 5\' 10"  (1.778 m)      Head Circumference --      Peak Flow --      Pain Score 03/31/23 1721 2     Pain Loc --      Pain Education --      Exclude from Growth Chart --    No data found.  Updated Vital Signs BP 123/78 (BP Location: Left Arm)   Pulse 75   Temp 98 F (36.7 C) (Oral)   Resp 16   Ht 5\' 10"  (1.778 m)   Wt 195 lb (88.5 kg)   SpO2 97%   BMI 27.98 kg/m   Visual Acuity Right Eye Distance:   Left Eye Distance:   Bilateral Distance:    Right Eye Near:   Left Eye Near:    Bilateral Near:     Physical Exam Constitutional:      Appearance: Normal appearance. He is not ill-appearing.  HENT:     Mouth/Throat:     Mouth: No angioedema.     Pharynx: Oropharynx is clear. No posterior oropharyngeal erythema.  Cardiovascular:     Rate and Rhythm: Normal rate and regular rhythm.  Pulmonary:     Effort: Pulmonary effort is normal.     Breath sounds: Normal breath sounds.  Musculoskeletal:       Feet:  Feet:     Comments: 0.5 x 0.5 cm ulceration present to the plantar aspect of the left great toe, pink granulation tissue to the wound, no drainage noted, mildly tender, no acute erythema or swelling to the surrounding tissue Neurological:     Mental Status: He is alert.      UC Treatments / Results  Labs (all labs ordered are listed, but only abnormal results are displayed) Labs Reviewed - No data to display  EKG   Radiology No results found.  Procedures Procedures (including critical care time)  Medications Ordered in UC Medications - No data to display  Initial Impression / Assessment and Plan /  UC Course  I have reviewed the triage vital signs and the nursing notes.  Pertinent labs & imaging results that were available during my care of the patient were reviewed by me and considered in my medical decision making (see chart for details).    Ulceration of toe appears to be healing well. Continue routine home care and f/u with podiatrist  No concerning finding r/t couhg.  I wonder if it it due to GERD an suggested trial of famotidine. Pt needs to f/u with PCP- he states he has an appt next week.   Final Clinical Impressions(s) / UC Diagnoses   Final diagnoses:  Cough, unspecified type  Diabetic ulcer of left great toe [facility])     Discharge Instructions      Talk with Dr. Doreene Burke about your cough and also about your frequent use of Tums. Try the famotidine (generic for pepcid) instead of Tums and see if it helps your cough.   Follow up with your podiatrist about your toe ulcer. It looks today like it is healing   ED Prescriptions     Medication Sig Dispense Auth. Provider   famotidine (PEPCID) 20 MG tablet Take 1 tablet (20 mg total) by mouth 2 (two) times daily. 60 tablet Cathlyn Parsons, NP      PDMP not reviewed this encounter.   Cathlyn Parsons, NP 03/31/23 [phone]

## 2023-03-31 NOTE — Discharge Instructions (Addendum)
Talk with Dr. Doreene Burke about your cough and also about your frequent use of Tums. Try the famotidine (generic for pepcid) instead of Tums and see if it helps your cough.   Follow up with your podiatrist about your toe ulcer. It looks today like it is healing

## 2023-03-31 NOTE — ED Triage Notes (Signed)
Patient here today with c/o cough X 1 week. Patient states that he has no other symptoms. Every once in a while he will cough something up. He has tried cough syrup, Asthma inhaler, and cough drops with no relief. No sick contacts. No recent travel.   He also has an ulcer on the right great toe. Patient states that it started out as a blister. Increased pain with direct pressure. Patient is diabetic and has some numbness in his feet. Patient has been seen for this about a week ago.

## 2023-04-03 ENCOUNTER — Telehealth: Payer: Self-pay | Admitting: Family Medicine

## 2023-04-03 ENCOUNTER — Ambulatory Visit (HOSPITAL_COMMUNITY): Payer: Medicare Other | Admitting: Student

## 2023-04-03 ENCOUNTER — Ambulatory Visit: Payer: Medicare Other | Admitting: Family Medicine

## 2023-04-03 NOTE — Telephone Encounter (Signed)
8.5.24 no show letter sent

## 2023-04-04 ENCOUNTER — Ambulatory Visit (INDEPENDENT_AMBULATORY_CARE_PROVIDER_SITE_OTHER): Payer: Medicare Other | Admitting: Family Medicine

## 2023-04-04 ENCOUNTER — Encounter: Payer: Self-pay | Admitting: Family Medicine

## 2023-04-04 ENCOUNTER — Ambulatory Visit: Payer: Medicare Other | Admitting: Family Medicine

## 2023-04-04 DIAGNOSIS — L97512 Non-pressure chronic ulcer of other part of right foot with fat layer exposed: Secondary | ICD-10-CM | POA: Diagnosis not present

## 2023-04-04 DIAGNOSIS — K219 Gastro-esophageal reflux disease without esophagitis: Secondary | ICD-10-CM

## 2023-04-04 DIAGNOSIS — R052 Subacute cough: Secondary | ICD-10-CM | POA: Diagnosis not present

## 2023-04-04 MED ORDER — OMEPRAZOLE 20 MG PO CPDR
20.0000 mg | DELAYED_RELEASE_CAPSULE | Freq: Every day | ORAL | 3 refills | Status: DC
Start: 2023-04-04 — End: 2023-07-25

## 2023-04-04 NOTE — Progress Notes (Signed)
**Note Glenn-Identified via Obfuscation** [facility] [address] [place] [address] Dept: [phone] Dept Fax: [phone]  Office Visit  Subjective:    Patient ID: Glenn Robertson, male    DOB: 1966-04-30, 57 y.o..   MRN: [id number]  Chief Complaint  Patient presents with   Cough    C/o having cry cough x 2 weeks.  Went to urgent care on 03/31/23.   Has been taking Pepcid and cough syrup/drops.    Average BS 110- 115   History of Present Illness:  Patient is in today complaining of a 2-week history of cough. He was seen at Urgent Care on 8/2 with this complaint. He denies any fever, runny nose, increased congestion, or sore throat. He ahs a history of allergies, managed with fexofenadine and fluticasone nasal spray. He has felt this was well controlled. He has noted increased heartburn over the recent weeks. He ahd been treating this with Tums. Last week, he was started on Pepcid.  Past Medical History: Patient Active Problem List   Diagnosis Date Noted   Exercise-induced asthma 02/20/2023   Lightheadedness 02/20/2023   Contact dermatitis due to drugs in contact with skin 11/17/2022   Uncontrolled type 2 diabetes mellitus with hyperglycemia (HCC) 09/21/2022   Burn erythema of axilla 07/19/2022   Xerosis of skin 07/19/2022   Erythrasma 07/19/2022   Intertrigo 07/05/2022   Tinea pedis of both feet 07/05/2022   Nondisplaced fracture of distal phalanx of right great toe, initial encounter for closed fracture 06/28/2022   Inclusion cyst 10/07/2021   Gait disturbance 10/04/2021   Olecranon bursitis of left elbow 05/31/2021   Allergic rhinitis 01/18/2021   Skin lesion of left ear 01/18/2021   Abrasion of left pinna 12/25/2020   Abrasion of left knee 12/25/2020   Tremor due to multiple drugs 10/02/2020   Dermatitis 10/02/2020   Polyneuropathy 05/12/2020   Elevated cholesterol 10/08/2019   Benign prostatic hyperplasia with nocturia 10/08/2019   Schizoaffective  disorder, depressive type (HCC) 11/27/2018   Urinary hesitancy 10/08/2018   Erectile dysfunction 09/24/2018   Numbness 08/20/2018   Transient alteration of awareness 08/08/2018   Tendinopathy of right gluteus medius 05/22/2018   Pain of right hip joint 05/08/2018   Hearing difficulty 04/13/2018   Trochanteric bursitis, right hip 03/23/2018   Reactive airway disease 03/16/2018   Headache 03/02/2018   Type 2 diabetes mellitus without complication, without long-term current use of insulin (HCC) 02/16/2018   Vitamin B 12 deficiency 02/16/2018   Screening for prostate cancer 02/16/2018   Pain in left leg 10/28/2016   Lumbosacral radiculopathy at S1 03/15/2016   Lumbar degenerative disc disease 12/18/2015   Disc displacement, lumbar 04/21/2015   Chondromalacia of both patellae 02/24/2015   Paranoid schizophrenia, chronic condition (HCC) 02/24/2015   Right anterior knee pain 07/18/2014   Right ankle pain 01/27/2014   Pain in joint, ankle and foot 01/27/2014   Sciatica 01/10/2012   Disturbance of skin sensation 11/14/2011   Past Surgical History:  Procedure Laterality Date   ANKLE ARTHROSCOPY     Arm Surgery  1/12   rt ulnar nerve decompression   EPIDURAL BLOCK INJECTION     several   ULNAR NERVE TRANSPOSITION  01/19/2012   Procedure: ULNAR NERVE DECOMPRESSION/TRANSPOSITION;  Surgeon: Wyn Forster., MD;  Location: [facility];  Service: Orthopedics;  Laterality: Left;  ulnar nerve decompression vs transposition left elbow   Family History  Problem Relation Age of Onset   Diabetes Father  Diabetes Paternal Uncle    Colon cancer Neg Hx    Colon polyps Neg Hx    Esophageal cancer Neg Hx    Rectal cancer Neg Hx    Stomach cancer Neg Hx    Outpatient Medications Prior to Visit  Medication Sig Dispense Refill   ACCU-CHEK SOFTCLIX LANCETS lancets 3 (three) times daily. for testing  0   albuterol (VENTOLIN HFA) 108 (90 Base) MCG/ACT inhaler Inhale 1-2 puffs into  the lungs every 6 (six) hours as needed for wheezing or shortness of breath. 18 g 0   amantadine (SYMMETREL) 100 MG capsule Take 1 capsule (100 mg total) by mouth 2 (two) times daily. Take 2 capsules in the morning (200 mg) and one capsule (100 mg) in the evening     Blood Glucose Monitoring Suppl (ACCU-CHEK GUIDE ME) w/Device KIT USE AS DIRECTED 1 kit 0   Cariprazine HCl (VRAYLAR) 6 MG CAPS TAKE 1 CAPSULE(6 MG) BY MOUTH DAILY 30 capsule 1   chlorhexidine (HIBICLENS) 4 % external liquid Apply topically daily as needed. 118 mL 0   clonazePAM (KLONOPIN) 1 MG tablet Take 1 tablet (1 mg total) by mouth 3 (three) times daily as needed for anxiety. for anxiety 90 tablet 0   fexofenadine (ALLEGRA ODT) 30 MG disintegrating tablet Take 30 mg by mouth daily.     fluPHENAZine (PROLIXIN) 5 MG tablet Take one tablet ( 5 mgr)  at bedtime 30 tablet 1   fluticasone (FLONASE) 50 MCG/ACT nasal spray SHAKE LIQUID AND USE 2 SPRAYS IN EACH NOSTRIL DAILY 16 g 6   glipiZIDE (GLUCOTROL) 10 MG tablet TAKE 1 TABLET(10 MG) BY MOUTH TWICE DAILY 180 tablet 3   glucose blood (ACCU-CHEK GUIDE) test strip 1 each by Other route in the morning, at noon, and at bedtime. Use to test glucose levels 3 times daily 200 strip 3   JARDIANCE 25 MG TABS tablet TAKE 1 TABLET BY MOUTH DAILY 90 tablet 2   metFORMIN (GLUCOPHAGE-XR) 500 MG 24 hr tablet TAKE 2 TABLETS(1000 MG) BY MOUTH TWICE DAILY 360 tablet 3   montelukast (SINGULAIR) 10 MG tablet TAKE 1 TABLET(10 MG) BY MOUTH AT BEDTIME 90 tablet 3   mupirocin ointment (BACTROBAN) 2 % Apply 1 Application topically daily. 22 g 0   sertraline (ZOLOFT) 100 MG tablet Take 1.5 tablets (150 mg total) by mouth daily. 45 tablet 1   simvastatin (ZOCOR) 20 MG tablet TAKE 1 TABLET(20 MG) BY MOUTH EVERY EVENING 90 tablet 3   tadalafil (CIALIS) 20 MG tablet TAKE ONE-HALF TO ONE TABLET BY MOUTH ONE TIME EVERY OTHER DAY AS NEEDED FOR ERECTILE DYSFUNCTION 10 tablet 2   traZODone (DESYREL) 50 MG tablet Take 1  tablet (50 mg total) by mouth at bedtime as needed for sleep. 30 tablet 0   TYRVAYA 0.03 MG/ACT SOLN Spray 1 spray in each nostril twice daily, approximately 12 hours apart     famotidine (PEPCID) 20 MG tablet Take 1 tablet (20 mg total) by mouth 2 (two) times daily. 60 tablet 0   Facility-Administered Medications Prior to Visit  Medication Dose Route Frequency Provider Last Rate Last Admin   methylPREDNISolone acetate (DEPO-MEDROL) injection 80 mg  80 mg Other Once Tyrell Antonio, MD       Allergies  Allergen Reactions   Cat Hair Extract    Citric Acid Other (See Comments)    Runny nose, eyes water   Food     Oranges or anything with citric acid   Gabapentin  Walking into things, hard to maintain balance, falls     Objective:   There were no vitals filed for this visit. There is no height or weight on file to calculate BMI.   General: Well developed, well nourished. No acute distress. HEENT: Normocephalic, non-traumatic. PERRL, EOMI. Conjunctiva clear. External ears normal.   EAC and TMs normal bilaterally. Nose with mild congestion and scant rhinorrhea. Mucous   membranes moist. Oropharynx clear. Good dentition. Neck: Supple. No lymphadenopathy. No thyromegaly. Lungs: Clear to auscultation bilaterally. No wheezing, rales or rhonchi. CV: RRR without murmurs or rubs. Pulses 2+ bilaterally. Psych: Alert and oriented. Normal mood and affect.  Health Maintenance Due  Topic Date Due   FOOT EXAM  07/15/2022     Assessment & Plan:  1. Subacute cough 2. Gastroesophageal reflux disease without esophagitis I do suspect that the recent increased acid reflux is the underlying cause of his cough. his allergies appear to be in good control. I recommend we switch him to a PPI. He should take this for a month and then see if he can stop the medicine.  - omeprazole (PRILOSEC) 20 MG capsule; Take 1 capsule (20 mg total) by mouth daily.  Dispense: 30 capsule; Refill: 3    No  follow-ups on file.   Loyola Mast, MD

## 2023-04-10 DIAGNOSIS — L97512 Non-pressure chronic ulcer of other part of right foot with fat layer exposed: Secondary | ICD-10-CM | POA: Diagnosis not present

## 2023-04-11 NOTE — Progress Notes (Signed)
BH MD Outpatient Progress Note  04/12/2023 6:29 PM Glenn Robertson  MRN: [id number]  Assessment:  Glenn Robertson presents for follow-up evaluation in-person.   Identifying Information: Glenn Robertson is a 57 y.o. male with a history of paranoid schizophrenia, MDD, no suicide attempt or inpatient psych admission, who is an established patient with [facility] for management of delusions and depressed mood.   Risk Assessment: An assessment of suicide and violence risk factors was performed as part of this evaluation and is not significantly changed from the last visit.             While future psychiatric events cannot be accurately predicted, the patient does not currently require acute inpatient psychiatric care and does not currently meet [facility] involuntary commitment criteria.          Plan:  # Paranoid schizophrenia vs delusional d/o (previously schizoaffective d/o with depressed mood)   Chronic benzodiazepine use Past medication trials:  Status of problem:  Reported persecutory vs paranoid delusions that he reported is unchanged initially. On further conversation, patient reported that he has noticed his stalkers multiple times at the grocery store. The stalker he is talking about has moved away, he does not know where they are. At this time does not feel that he needs to retaliate. Unclear if this is a mild exacerbation vs baseline. Will increase prolixin per below and reassess next month. Per previous f/u note (see Dr. Camila Robertson note 02/02/2023), patient reported no paranoia (seeing stalkers). However during this encounter (04/12/2023), he endorses seeing stalkers per above, last week, but on further chart review, patient has been a poor historian.  Questioning dx of paranoid schizophrenia (previously schizoaffective d/o) vs delusional d/o, given only sxs of delusions. However per chart review, patient had flat affect, grandiose delusions, bizarre perceptual  disturbances which is more consistent with scz/scza rather delusional d/o. Will continue assess to better clarify dx.  Unclear reason for patient's klonopin, suspect for anxiety related paranoia. He denied anxiety outside of the delusion. For this reason, will work on tapering patient off klonopin slowly.  AIMS 0  Interventions: Continued home vraylar 6 mg daily INCREASE home prolixin 5 mg to 7.5 mg qHS DECREASED home klonopin 1 mg TID PRN to 1 mg BID  PCP for repeat EKG  #EPS - dystonia  Action Tremors Past medication trials:  Status of problem:  AIMS 0, no dystonia on physical exam (04/12/2023). Does have action tremor b/l, none at rest. Suspect multifactorial 2/2 meds, DM2, essential tremor, possible neurocognitive d/o given issues with memory (2/3 delayed recall). Otherwise was A&Ox4, DOWB without difficulties. Will continue to assess.  Interventions: Continued home amantadine 100 mg BID  # MDD Past medication trials:  Status of problem:  Improved since increasing zoloft last month, still residual depressed mood due to limited physical activity after getting diabetic foot ulcer.  Interventions: Continued home zoloft 150 mg daily Continued home trazodone 50 mg at bedtime PRN  Health Maintenance PCP: Glenn Sax, MD  DM2 - metformin 1000 mg BID, jardiance 25 mg, glipizide 10 mg BID Exercised induced asthma - singulair 10 mg daily, allegra daily, tyrvaya nasal spray BID HLD - simvastatin 20 mg daily ED - cialis 20 mg PRN  Return to care in: Future Appointments  Date Time Provider Department Center  05/10/2023  2:00 PM Glenn Bruins, DO [facility] None  05/17/2023 11:30 AM Glenn Piedra, DO [facility] None  10/06/2023  9:45 AM [facility]-ANNUAL WELLNESS VISIT  [facility]    Patient was given contact information for behavioral health clinic and was instructed to call 911 for emergencies.    Patient and plan of care will be discussed with the Attending MD, Dr. Adrian Robertson, who  agrees with the above statement and plan.   Subjective:  Chief Complaint:  Chief Complaint  Patient presents with   Paranoid   Delusional   Interval History:   Mood: "annoyed". Denied depression or anxiety.  Stated that he feels more annoyed recently due to recent diabetic foot ulcer which limits his running. Last time running was July this year.   Hobby - photography - went to [place] sometime this past week. Able to enjoy hobbies  Worried - mom's health, finances (disability) - if mom dies, patient is not able to support house and car alone. Not new stressor.   No suicidal thoughts, last time years ago.  Sleep: has naps (2-3hrs at a time) multiple times a day, because he is not able to run. Denied fatigue, just boredom.   Appetite: good, no changes over the past few months  Feels that ex-neighbor or their family member are stalking him. Reported possibly seeing them in the grocery store. He does not know why they are stalking him. Denied divinity, powers, money as possible reasons. Unsure if the stalkers want to hurt him. But he feels safe going out in public, at home, sleeping at night, going to the grocery store. Stated that he feels annoyed with them and wants to "hurt" them. Initially stated that last time he saw any stalker was 2 years ago, but when asked again, stated the last time was last week.   Stated that he wants to "hurt" his ex-neighbor. Stated that he wants to "beat them up for having loud music and stalking him". The neighbor he is talking about no longer lives near him and he has no idea where they are. When asked how he would "beat them up", he has no idea to find time. Denied h/o fighting or violence. Denied acting on thoughts in the past. Denied access to guns or weapons. Does not feel that he needs to defend himself currently, but maybe if they keep the stalking up. Denied intent or plan.   Had previously 2 stalkers - last time seen was 2 years ago.  - 1 from  [place] - childhood acquaintance that drove a truck. - 1 state trooper.  EtOH: Denied Nicotine: Denied Cannabis: Denied Other substances: Denied   2002 - he was started on klonopin for anxiety, has been taking it TID since.  Reported that at one point he was off of it for ~65mo - reported that he was irritable and impulsive. It was ~2020, can't remember if he was anxious or not.   Appears to be some difficulties with memory. 2024, Aug, 13th. [place], [place], [place], [place] DOWB  [place], Materials engineer, house - immediate [employer], house - delayed recall     Patient amenable to tapering off klonopin and increasing prolixin after discussing the risks, benefits, and side effects. Otherwise patient had no other questions or concerns and was amenable to plan per above.  Safety: Possibly VH per above. Persecutory vs paranoid delusions per above. Possibly thoughts to hurt but not kill ex-neighbor (no specific plan or access to). Denied thought broadcasting/insertion/withdrawal, mind control, idea of reference. Denied AH. Denied SI.  Denied access to guns or weapons.  Review of Systems  Constitutional:  Negative for appetite change and fatigue.  Respiratory:  Negative for shortness of breath.   Cardiovascular:  Negative for chest pain.  Gastrointestinal:  Negative for abdominal pain, constipation, diarrhea and nausea.  Neurological:  Positive for dizziness. Negative for headaches.    Visit Diagnosis:    ICD-10-CM   1. Paranoid schizophrenia, chronic condition (HCC)  F20.0 fluPHENAZine (PROLIXIN) 5 MG tablet    amantadine (SYMMETREL) 100 MG capsule    Cariprazine HCl (VRAYLAR) 6 MG CAPS    2. Recurrent major depressive disorder, in partial remission (HCC)  F33.41 sertraline (ZOLOFT) 100 MG tablet    3. Chronic prescription benzodiazepine use  Z79.899     4. Long term current use of antipsychotic medication  Z79.899       Past Psychiatric History:  Diagnoses: Paranoid schizophrenia, MDD,  schizoaffective d/o-depressed mood Medication trials:  Klonopin, gabapentin, lyrica amantadine Wellbutrin, zoloft, trazodone [name], latuda, fanapt, geodon, Prolixin, vraylar Previous psychiatrist/therapist: yes Hospitalizations: NA Suicide attempts: NA Hx of violence towards others: Denied Current access to guns: Denied  Substance Use History: EtOH:  reports no history of alcohol use. Nicotine:  reports that he has never smoked. He has never used smokeless tobacco. Marijuana: Denied IV drug use: Denied Stimulants: Denied Opiates: Denied Sedative/hypnotics: Denied Hallucinogens: Denied  Past Medical History: Dx:  has a past medical history of Allergy, Anxiety, Asthma, Chronic pain, Depression, Diabetes mellitus, Glaucoma, Neuromuscular disorder (HCC), and Paranoid schizophrenia (HCC).  Seizures: no, EEG x2 (2020, 2023) WNL Allergies: Cat hair extract, Citric acid, Food, and Gabapentin   Family Psychiatric History:  Mother- Depression and PTSD, on Abilify Paternal Aunt- Paranoia, hoarding  Social History:  Housing: Living with mom Income: disability  Past Medical History:  Past Medical History:  Diagnosis Date   Allergy    dog and cats, citrus   Anxiety    Asthma    Chronic pain    Depression    Diabetes mellitus    Glaucoma    Neuromuscular disorder (HCC)    Paranoid schizophrenia (HCC)     Past Surgical History:  Procedure Laterality Date   ANKLE ARTHROSCOPY     Arm Surgery  1/12   rt ulnar nerve decompression   EPIDURAL BLOCK INJECTION     several   ULNAR NERVE TRANSPOSITION  01/19/2012   Procedure: ULNAR NERVE DECOMPRESSION/TRANSPOSITION;  Surgeon: Wyn Forster., MD;  Location: [facility];  Service: Orthopedics;  Laterality: Left;  ulnar nerve decompression vs transposition left elbow   Family History:  Family History  Problem Relation Age of Onset   Diabetes Father    Diabetes Paternal Uncle    Colon cancer Neg Hx    Colon  polyps Neg Hx    Esophageal cancer Neg Hx    Rectal cancer Neg Hx    Stomach cancer Neg Hx    Social History   Socioeconomic History   Marital status: Single    Spouse name: Not on file   Number of children: 0   Years of education: BA   Highest education level: Not on file  Occupational History   Occupation: Unemlpoyed-disabled  Tobacco Use   Smoking status: Never   Smokeless tobacco: Never  Vaping Use   Vaping status: Never Used  Substance and Sexual Activity   Alcohol use: No   Drug use: No   Sexual activity: Yes  Other Topics Concern   Not on file  Social History Narrative   Lives with mother   Caffeine use: Coke very rare   Right handed  Social Determinants of Health   Financial Resource Strain: Low Risk  (10/03/2022)   Overall Financial Resource Strain (CARDIA)    Difficulty of Paying Living Expenses: Not hard at all  Food Insecurity: No Food Insecurity (10/03/2022)   Hunger Vital Sign    Worried About Running Out of Food in the Last Year: Never true    Ran Out of Food in the Last Year: Never true  Transportation Needs: No Transportation Needs (10/03/2022)   PRAPARE - Administrator, Civil Service (Medical): No    Lack of Transportation (Non-Medical): No  Physical Activity: Insufficiently Active (10/03/2022)   Exercise Vital Sign    Days of Exercise per Week: 4 days    Minutes of Exercise per Session: 30 min  Stress: Stress Concern Present (10/03/2022)   [employer] of Occupational Health - Occupational Stress Questionnaire    Feeling of Stress : To some extent  Social Connections: Socially Isolated (09/21/2021)   Social Connection and Isolation Panel [NHANES]    Frequency of Communication with Friends and Family: Three times a week    Frequency of Social Gatherings with Friends and Family: Three times a week    Attends Religious Services: Never    Active Member of Clubs or Organizations: No    Attends Banker Meetings: Not on file     Marital Status: Never married    Allergies:  Allergies  Allergen Reactions   Cat Hair Extract    Citric Acid Other (See Comments)    Runny nose, eyes water   Food     Oranges or anything with citric acid   Gabapentin     Walking into things, hard to maintain balance, falls    Current Medications: Current Outpatient Medications  Medication Sig Dispense Refill   ACCU-CHEK SOFTCLIX LANCETS lancets 3 (three) times daily. for testing  0   albuterol (VENTOLIN HFA) 108 (90 Base) MCG/ACT inhaler Inhale 1-2 puffs into the lungs every 6 (six) hours as needed for wheezing or shortness of breath. 18 g 0   amantadine (SYMMETREL) 100 MG capsule Take 1 capsule (100 mg total) by mouth 2 (two) times daily. Take 2 capsules in the morning (200 mg) and one capsule (100 mg) in the evening 30 capsule 1   Blood Glucose Monitoring Suppl (ACCU-CHEK GUIDE ME) w/Device KIT USE AS DIRECTED 1 kit 0   Cariprazine HCl (VRAYLAR) 6 MG CAPS Take 1 capsule (6 mg total) by mouth daily. TAKE 1 CAPSULE(6 MG) BY MOUTH DAILY 30 capsule 1   chlorhexidine (HIBICLENS) 4 % external liquid Apply topically daily as needed. 118 mL 0   clonazePAM (KLONOPIN) 1 MG tablet Take 1 tablet (1 mg total) by mouth 2 (two) times daily as needed for anxiety. for anxiety 60 tablet 0   fexofenadine (ALLEGRA ODT) 30 MG disintegrating tablet Take 30 mg by mouth daily.     fluPHENAZine (PROLIXIN) 5 MG tablet Take 1.5 tablets (7.5 mg total) by mouth at bedtime. Take one tablet ( 5 mgr)  at bedtime 45 tablet 1   fluticasone (FLONASE) 50 MCG/ACT nasal spray SHAKE LIQUID AND USE 2 SPRAYS IN EACH NOSTRIL DAILY 16 g 6   glipiZIDE (GLUCOTROL) 10 MG tablet TAKE 1 TABLET(10 MG) BY MOUTH TWICE DAILY 180 tablet 3   glucose blood (ACCU-CHEK GUIDE) test strip 1 each by Other route in the morning, at noon, and at bedtime. Use to test glucose levels 3 times daily 200 strip 3   JARDIANCE 25 MG  TABS tablet TAKE 1 TABLET BY MOUTH DAILY 90 tablet 2   metFORMIN  (GLUCOPHAGE-XR) 500 MG 24 hr tablet TAKE 2 TABLETS(1000 MG) BY MOUTH TWICE DAILY 360 tablet 3   montelukast (SINGULAIR) 10 MG tablet TAKE 1 TABLET(10 MG) BY MOUTH AT BEDTIME 90 tablet 3   mupirocin ointment (BACTROBAN) 2 % Apply 1 Application topically daily. 22 g 0   omeprazole (PRILOSEC) 20 MG capsule Take 1 capsule (20 mg total) by mouth daily. 30 capsule 3   sertraline (ZOLOFT) 100 MG tablet Take 1.5 tablets (150 mg total) by mouth daily. 45 tablet 1   simvastatin (ZOCOR) 20 MG tablet TAKE 1 TABLET(20 MG) BY MOUTH EVERY EVENING 90 tablet 3   tadalafil (CIALIS) 20 MG tablet TAKE ONE-HALF TO ONE TABLET BY MOUTH ONE TIME EVERY OTHER DAY AS NEEDED FOR ERECTILE DYSFUNCTION 10 tablet 2   traZODone (DESYREL) 50 MG tablet Take 1 tablet (50 mg total) by mouth at bedtime as needed for sleep. 30 tablet 0   TYRVAYA 0.03 MG/ACT SOLN Spray 1 spray in each nostril twice daily, approximately 12 hours apart     Current Facility-Administered Medications  Medication Dose Route Frequency Provider Last Rate Last Admin   methylPREDNISolone acetate (DEPO-MEDROL) injection 80 mg  80 mg Other Once Tyrell Antonio, MD        Objective: Psychiatric Specialty Exam: Blood pressure 110/75, pulse 88, height 5' 9.25" (1.759 m), weight 194 lb 6.6 oz (88.2 kg), SpO2 99%.Body mass index is 28.5 kg/m.  General Appearance: Casual, faily groomed  Eye Contact:  Good    Speech:  Clear, coherent, normal rate   Volume:  Normal   Mood:  "annoyed"  Affect:  Appropriate, congruent, full range  Thought Content: Logical, rumination  Suicidal Thoughts: Denied active and passive SI    Thought Process:  Coherent, goal-directed, linear   Orientation:  A&Ox4   Memory:  Immediate good  Judgment:  Fair   Insight:  Shallow  Concentration:  Attention and concentration good  Recall:  immediate good, delayed poor (2/3 words correct on recall)  Fund of Knowledge: Good  Language: Good, fluent  Psychomotor Activity: Normal at rest, b/l  tremor in hands when pointing  Akathisia:  NA   AIMS (if indicated): 0  Assets:  Communication Skills Desire for Improvement Housing Leisure Time Physical Health Resilience Social Support Talents/Skills Transportation Vocational/Educational  ADL's:  Intact  Cognition: WNL  Sleep:  Fair    PE: General: well-appearing; no acute distress  Pulm: no increased work of breathing on room air  Strength & Muscle Tone: within normal limits Neuro: no focal neurological deficits observed  Gait & Station: normal  Metabolic Disorder Labs: Lab Results  Component Value Date   HGBA1C 7.4 (H) 02/20/2023   MPG 123 03/02/2018   No results found for: "PROLACTIN" Lab Results  Component Value Date   CHOL 115 11/09/2022   TRIG 60.0 11/09/2022   HDL 52.50 11/09/2022   CHOLHDL 2 11/09/2022   VLDL 12.0 11/09/2022   LDLCALC 50 11/09/2022   LDLCALC 58 07/26/2022   Lab Results  Component Value Date   TSH 1.02 07/08/2019   TSH 0.92 03/15/2018    Therapeutic Level Labs: No results found for: "LITHIUM" No results found for: "VALPROATE" No results found for: "CBMZ"  Screenings: GAD-7    Flowsheet Row Office Visit from 07/26/2022 in [facility]  Total GAD-7 Score 5      PHQ2-9    Flowsheet Row  Counselor from 03/08/2023 in [facility] from 12/29/2022 in [facility] at [facility] Visit from 11/17/2022 in [facility] Visit from 11/09/2022 in [facility] at [employer] [facility] Clinical Support from 10/03/2022 in [facility] at [employer]  PHQ-2 Total Score 0 0 0 0 0      Flowsheet Row ED from 03/31/2023 in [facility] from 03/19/2023 in [facility] at [facility] from 03/08/2023 in [facility] Outpatient Behavioral Health at [facility] RISK CATEGORY No Risk No Risk No Risk       Patient/Guardian was advised Release of Information must be obtained prior to any record release in order to collaborate their care with an outside provider. Patient/Guardian was advised if they have not already done so to contact the registration department to sign all necessary forms in order for [place] to release information regarding their care.   Consent: Patient/Guardian gives verbal consent for treatment and assignment of benefits for services provided during this visit. Patient/Guardian expressed understanding and agreed to proceed.   Glenn Bruins, DO Psych Resident, PGY-3

## 2023-04-12 ENCOUNTER — Ambulatory Visit (HOSPITAL_COMMUNITY): Payer: Medicare Other | Admitting: Student

## 2023-04-12 ENCOUNTER — Encounter (HOSPITAL_COMMUNITY): Payer: Self-pay | Admitting: Student

## 2023-04-12 VITALS — BP 110/75 | HR 88 | Ht 69.25 in | Wt 194.4 lb

## 2023-04-12 DIAGNOSIS — F200 Paranoid schizophrenia: Secondary | ICD-10-CM | POA: Diagnosis not present

## 2023-04-12 DIAGNOSIS — F3342 Major depressive disorder, recurrent, in full remission: Secondary | ICD-10-CM | POA: Insufficient documentation

## 2023-04-12 DIAGNOSIS — Z79899 Other long term (current) drug therapy: Secondary | ICD-10-CM | POA: Insufficient documentation

## 2023-04-12 DIAGNOSIS — F3341 Major depressive disorder, recurrent, in partial remission: Secondary | ICD-10-CM | POA: Diagnosis not present

## 2023-04-12 MED ORDER — AMANTADINE HCL 100 MG PO CAPS
100.0000 mg | ORAL_CAPSULE | Freq: Two times a day (BID) | ORAL | 1 refills | Status: DC
Start: 2023-04-12 — End: 2023-05-10

## 2023-04-12 MED ORDER — FLUPHENAZINE HCL 5 MG PO TABS
7.5000 mg | ORAL_TABLET | Freq: Every day | ORAL | 1 refills | Status: DC
Start: 2023-04-12 — End: 2023-06-30

## 2023-04-12 MED ORDER — VRAYLAR 6 MG PO CAPS
6.0000 mg | ORAL_CAPSULE | Freq: Every day | ORAL | 1 refills | Status: DC
Start: 2023-04-12 — End: 2023-05-10

## 2023-04-12 MED ORDER — SERTRALINE HCL 100 MG PO TABS
150.0000 mg | ORAL_TABLET | Freq: Every day | ORAL | 1 refills | Status: DC
Start: 2023-04-12 — End: 2023-05-10

## 2023-04-12 MED ORDER — CLONAZEPAM 1 MG PO TABS: 1.0000 mg | ORAL_TABLET | Freq: Two times a day (BID) | ORAL | 0 refills | Status: DC | PRN

## 2023-04-12 NOTE — Patient Instructions (Signed)
Please see PCP for monitoring EKG

## 2023-04-13 NOTE — Addendum Note (Signed)
Addended by: Tia Masker on: 04/13/2023 01:19 PM   Modules accepted: Level of Service

## 2023-04-20 ENCOUNTER — Telehealth (HOSPITAL_COMMUNITY): Payer: Self-pay | Admitting: *Deleted

## 2023-04-20 NOTE — Telephone Encounter (Signed)
Pt called asking if there would be a "replacement" for the Klonopin as it has been decreased. Per your note from visit on 04/12/23 medication has been decreased from 1 mg TID to 1 mg BID. Also in your note plan to increase prolixin. Pt has a f/u scheduled for 05/10/23. Pt would like a return call. Please review.

## 2023-04-24 DIAGNOSIS — L97512 Non-pressure chronic ulcer of other part of right foot with fat layer exposed: Secondary | ICD-10-CM | POA: Diagnosis not present

## 2023-04-26 ENCOUNTER — Encounter: Payer: Self-pay | Admitting: Family Medicine

## 2023-04-26 NOTE — Telephone Encounter (Signed)
[name] [phone]  Able to get in contact with patient, stated that he just wanted to go over med changes to make sure he remembered correctly.  Patient was able to tell me what medication he should be on, the changes made during last appointment, and when/how often to take them.  Patient has some nervousness about coming off of klonopin, but reassured patient that we will continue a slow taper.

## 2023-05-08 DIAGNOSIS — L97512 Non-pressure chronic ulcer of other part of right foot with fat layer exposed: Secondary | ICD-10-CM | POA: Diagnosis not present

## 2023-05-09 DIAGNOSIS — M7751 Other enthesopathy of right foot: Secondary | ICD-10-CM | POA: Diagnosis not present

## 2023-05-09 DIAGNOSIS — M65871 Other synovitis and tenosynovitis, right ankle and foot: Secondary | ICD-10-CM | POA: Diagnosis not present

## 2023-05-10 ENCOUNTER — Encounter (HOSPITAL_COMMUNITY): Payer: Self-pay | Admitting: Student

## 2023-05-10 ENCOUNTER — Ambulatory Visit (HOSPITAL_BASED_OUTPATIENT_CLINIC_OR_DEPARTMENT_OTHER): Payer: Medicare Other | Admitting: Student

## 2023-05-10 VITALS — BP 100/68 | HR 91 | Ht 69.25 in | Wt 195.0 lb

## 2023-05-10 DIAGNOSIS — Z79899 Other long term (current) drug therapy: Secondary | ICD-10-CM | POA: Diagnosis not present

## 2023-05-10 DIAGNOSIS — F200 Paranoid schizophrenia: Secondary | ICD-10-CM | POA: Diagnosis not present

## 2023-05-10 DIAGNOSIS — F3341 Major depressive disorder, recurrent, in partial remission: Secondary | ICD-10-CM | POA: Diagnosis not present

## 2023-05-10 MED ORDER — AMANTADINE HCL 100 MG PO CAPS: 100.0000 mg | ORAL_CAPSULE | Freq: Two times a day (BID) | ORAL | 2 refills | Status: DC

## 2023-05-10 MED ORDER — VRAYLAR 6 MG PO CAPS
6.0000 mg | ORAL_CAPSULE | Freq: Every day | ORAL | 2 refills | Status: DC
Start: 2023-05-10 — End: 2023-06-30

## 2023-05-10 MED ORDER — CLONAZEPAM 1 MG PO TABS
ORAL_TABLET | ORAL | 2 refills | Status: DC
Start: 2023-05-10 — End: 2023-07-24

## 2023-05-10 MED ORDER — SERTRALINE HCL 100 MG PO TABS
150.0000 mg | ORAL_TABLET | Freq: Every day | ORAL | 2 refills | Status: DC
Start: 2023-05-10 — End: 2023-06-30

## 2023-05-10 NOTE — Progress Notes (Signed)
BH MD Outpatient Progress Note  05/10/2023 4:18 PM Glenn Robertson  MRN: [id number]  Assessment:  [name] presents for follow-up evaluation in-person.   Identifying Information: Glenn Robertson is a 57 y.o. male with a history of paranoid schizophrenia, MDD, no suicide attempt or inpatient psych admission, who is an established patient with [facility] for management of delusions and depressed mood.   Risk Assessment: An assessment of suicide and violence risk factors was performed as part of this evaluation and is not significantly changed from the last visit.             While future psychiatric events cannot be accurately predicted, the patient does not currently require acute inpatient psychiatric care and does not currently meet [facility] involuntary commitment criteria.          Plan:  # Paranoid schizophrenia vs delusional d/o (previously schizoaffective d/o with depressed mood)   Chronic benzodiazepine use Past medication trials:  Status of problem:  Reported persecutory vs paranoid delusions that he reported is unchanged initially. On further conversation, patient reported that he has noticed his stalkers multiple times at the grocery store. The stalker he is talking about has moved away, he does not know where they are. Per previous f/u note (see Dr. Camila Li note 02/02/2023), patient reported no paranoia (seeing stalkers). However during 04/12/2023 encounter, he endorsed seeing stalkers per above, last week, but on further chart review, patient has been a poor historian.  Questioning dx of paranoid schizophrenia (previously schizoaffective d/o) vs delusional d/o, given only sxs of delusions. However per chart review, patient had flat affect, grandiose delusions, bizarre perceptual disturbances which is more consistent with scz/scza rather delusional d/o. Will continue assess to better clarify dx.  Unclear reason for patient's klonopin, suspect for  anxiety related paranoia. He denied anxiety outside of the delusion. For this reason, will work on tapering patient off klonopin slowly.  AIMS 0 (04/12/2023) Tolerated decreased klonopin and increased prolixin well, paranoia has resolved and there is no worsening of anxiety with decreased klonopin.  Continued klonopin taper per below. If patient were to have increased anxiety or Doretha Imus will consider either decreasing vraylar because of its higher chance to cause it or increase prolixin. Would preferably not have patient on dual antipsychotic if can help it.  Interventions: Continued home vraylar 6 mg daily Continued home prolixin 7.5 mg qHS DECREASED home klonopin 1 mg BID to 0.5 mg qAM + 1 mg qPM PCP for repeat EKG  #EPS - dystonia  Action Tremors Past medication trials:  Status of problem:  AIMS 0 (04/12/2023), no dystonia on physical exam (04/12/2023). Does have action tremor b/l, none at rest. Suspect multifactorial 2/2 meds, DM2, essential tremor, possible neurocognitive d/o given issues with memory (2/3 delayed recall). Otherwise was A&Ox4, DOWB without difficulties. Will continue to assess.  Interventions:  Continued home amantadine 100 mg BID  # MDD Past medication trials:  Status of problem:  Depressed mood is much improved, evident by increased motivation to resume his old hobbies. He rarely uses trazodone.  Interventions: Continued home zoloft 150 mg daily Continued home trazodone 50 mg at bedtime PRN  Health Maintenance PCP: Mliss Sax, MD  DM2 - metformin 1000 mg BID, jardiance 25 mg, glipizide 10 mg BID Exercised induced asthma - singulair 10 mg daily, allegra daily, tyrvaya nasal spray BID HLD - simvastatin 20 mg daily ED - cialis 20 mg PRN  Return to care in: Future Appointments  Date  Time Provider Department Center  05/17/2023 11:30 AM Terri Piedra, DO [facility] None  07/03/2023  9:30 AM Princess Bruins, DO [facility] None  08/04/2023  8:00 AM Mliss Sax, MD [facility] [place]  10/06/2023  9:45 AM [facility]ANNUAL WELLNESS VISIT [facility]   Patient was given contact information for behavioral health clinic and was instructed to call 911 for emergencies.   Patient and plan of care will be discussed with the Attending MD, Dr. Adrian Blackwater, who agrees with the above statement and plan.   Subjective:  Chief Complaint:  Chief Complaint  Patient presents with   Anxiety   Interval History:   Mood: "better, still annoyed" because of foot pain. Denied depression or anxiety after decreasing his klonopin.  Diabetic foot ulcer much improved, no longer wearing a boot. But now he has tendonitis/fascitis of the same foot which limits his physical activity (walking/running). On naproxen and voltarean gel which helps. 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 He does do events in the past.   May try biking later today since the weather is nice.   No more feeling like someone is following him.  Neighbor are good, no concerns.  He continues to worry about finances if his mom were to pass. Discussed with him that the feeling is natural. Discussed consider working part time in the future if his mood and delusions remain stable.   Sleep: good now. Still napping during the day. Needs trazodone PRN rarely  Appetite: good, has cut back on the sweets because of his diabetes. Craving cashews.   EtOH: Denied Nicotine: Denied Cannabis: Denied Other substances: Denied  Reported that he still has difficulties with concentration and memory in the short term. Stated that he would walk in a room and forgot he planned to do.  Still having difficulties finishing his book.  Discussed that klonopin daily long-term can cause these concerns, which is why the goal is to taper him off of it slowly.  05/10/2023 [place], [place], [place], [place] DOWB  easily  Patient amenable to decreasing klonopin after discussing the risks, benefits, and side effects. Otherwise patient had no other questions or concerns and was amenable to plan per above.  Safety: Denied active and passive SI, HI, AVH, paranoid or persecutory delusions. Denied thought broadcasting/insertion/withdrawal, mind control, idea of reference.    Review of Systems  Constitutional:  Positive for activity change (more activity). Negative for appetite change and fatigue.  Respiratory:  Negative for shortness of breath.   Cardiovascular:  Negative for chest pain.  Gastrointestinal:  Negative for abdominal pain, constipation, diarrhea and nausea.  Neurological:  Positive for tremors. Negative for dizziness and headaches.   Visit Diagnosis:    ICD-10-CM   1. Chronic prescription benzodiazepine use  Z79.899 clonazePAM (KLONOPIN) 1 MG tablet    2. Recurrent major depressive disorder, in partial remission (HCC)  F33.41 sertraline (ZOLOFT) 100 MG tablet    3. Paranoid schizophrenia, chronic condition (HCC)  F20.0 amantadine (SYMMETREL) 100 MG capsule    Cariprazine HCl (VRAYLAR) 6 MG CAPS    4. Long term current use of antipsychotic medication  Z79.899       Past Psychiatric History:  Diagnoses: Paranoid schizophrenia, MDD, schizoaffective d/o-depressed mood Medication trials:  Klonopin, gabapentin, lyrica amantadine Wellbutrin, zoloft, trazodone [name], latuda, fanapt, geodon, Prolixin, vraylar Previous psychiatrist/therapist: yes Hospitalizations: NA Suicide attempts: NA  Hx of violence towards others: Denied Current access to guns: Denied  Substance Use History: EtOH:  reports no history of alcohol use. Nicotine:  reports that he has never smoked. He has never used smokeless tobacco. Marijuana: Denied IV drug use: Denied Stimulants: Denied Opiates: Denied Sedative/hypnotics: Denied Hallucinogens: Denied  Past Medical History: Dx:  has a past medical history of  Allergy, Anxiety, Asthma, Chronic pain, Depression, Diabetes mellitus, Glaucoma, Neuromuscular disorder (HCC), and Paranoid schizophrenia (HCC).  Seizures: no, EEG x2 (2020, 2023) WNL Allergies: Cat hair extract, Citric acid, Food, and Gabapentin   Family Psychiatric History:  Mother- Depression and PTSD, on Abilify Paternal Aunt- Paranoia, hoarding  Social History:  Housing: Living with mom Income: disability for mental health  Past Medical History:  Past Medical History:  Diagnosis Date   Allergy    dog and cats, citrus   Anxiety    Asthma    Chronic pain    Depression    Diabetes mellitus    Glaucoma    Neuromuscular disorder (HCC)    Paranoid schizophrenia (HCC)     Past Surgical History:  Procedure Laterality Date   ANKLE ARTHROSCOPY     Arm Surgery  1/12   rt ulnar nerve decompression   EPIDURAL BLOCK INJECTION     several   ULNAR NERVE TRANSPOSITION  01/19/2012   Procedure: ULNAR NERVE DECOMPRESSION/TRANSPOSITION;  Surgeon: Wyn Forster., MD;  Location:  SURGERY CENTER;  Service: Orthopedics;  Laterality: Left;  ulnar nerve decompression vs transposition left elbow   Family History:  Family History  Problem Relation Age of Onset   Diabetes Father    Diabetes Paternal Uncle    Colon cancer Neg Hx    Colon polyps Neg Hx    Esophageal cancer Neg Hx    Rectal cancer Neg Hx    Stomach cancer Neg Hx    Social History   Socioeconomic History   Marital status: Single    Spouse name: Not on file   Number of children: 0   Years of education: BA   Highest education level: Not on file  Occupational History   Occupation: Unemlpoyed-disabled  Tobacco Use   Smoking status: Never   Smokeless tobacco: Never  Vaping Use   Vaping status: Never Used  Substance and Sexual Activity   Alcohol use: No   Drug use: No   Sexual activity: Yes  Other Topics Concern   Not on file  Social History Narrative   Lives with mother   Caffeine use: Coke very rare    Right handed    Social Determinants of Health   Financial Resource Strain: Low Risk  (10/03/2022)   Overall Financial Resource Strain (CARDIA)    Difficulty of Paying Living Expenses: Not hard at all  Food Insecurity: No Food Insecurity (10/03/2022)   Hunger Vital Sign    Worried About Running Out of Food in the Last Year: Never true    Ran Out of Food in the Last Year: Never true  Transportation Needs: No Transportation Needs (10/03/2022)   PRAPARE - Administrator, Civil Service (Medical): No    Lack of Transportation (Non-Medical): No  Physical Activity: Insufficiently Active (10/03/2022)   Exercise Vital Sign    Days of Exercise per Week: 4 days    Minutes of Exercise per Session: 30 min  Stress: Stress Concern Present (10/03/2022)   [employer] of Occupational Health - Occupational Stress Questionnaire    Feeling of Stress :  To some extent  Social Connections: Socially Isolated (09/21/2021)   Social Connection and Isolation Panel [NHANES]    Frequency of Communication with Friends and Family: Three times a week    Frequency of Social Gatherings with Friends and Family: Three times a week    Attends Religious Services: Never    Active Member of Clubs or Organizations: No    Attends Banker Meetings: Not on file    Marital Status: Never married    Allergies:  Allergies  Allergen Reactions   Cat Hair Extract    Citric Acid Other (See Comments)    Runny nose, eyes water   Food     Oranges or anything with citric acid   Gabapentin     Walking into things, hard to maintain balance, falls    Current Medications: Current Outpatient Medications  Medication Sig Dispense Refill   ACCU-CHEK SOFTCLIX LANCETS lancets 3 (three) times daily. for testing  0   albuterol (VENTOLIN HFA) 108 (90 Base) MCG/ACT inhaler Inhale 1-2 puffs into the lungs every 6 (six) hours as needed for wheezing or shortness of breath. 18 g 0   amantadine (SYMMETREL) 100 MG capsule  Take 1 capsule (100 mg total) by mouth 2 (two) times daily. Take 2 capsules in the morning (200 mg) and one capsule (100 mg) in the evening 60 capsule 2   Blood Glucose Monitoring Suppl (ACCU-CHEK GUIDE ME) w/Device KIT USE AS DIRECTED 1 kit 0   Cariprazine HCl (VRAYLAR) 6 MG CAPS Take 1 capsule (6 mg total) by mouth daily. TAKE 1 CAPSULE(6 MG) BY MOUTH DAILY 30 capsule 2   chlorhexidine (HIBICLENS) 4 % external liquid Apply topically daily as needed. 118 mL 0   clonazePAM (KLONOPIN) 1 MG tablet for anxiety, please take 1/2 tablet (0.5 mg) in the morning and 1 tablet (1 mg) at night 60 tablet 2   fexofenadine (ALLEGRA ODT) 30 MG disintegrating tablet Take 30 mg by mouth daily.     fluPHENAZine (PROLIXIN) 5 MG tablet Take 1.5 tablets (7.5 mg total) by mouth at bedtime. Take one tablet ( 5 mgr)  at bedtime 45 tablet 1   fluticasone (FLONASE) 50 MCG/ACT nasal spray SHAKE LIQUID AND USE 2 SPRAYS IN EACH NOSTRIL DAILY 16 g 6   glipiZIDE (GLUCOTROL) 10 MG tablet TAKE 1 TABLET(10 MG) BY MOUTH TWICE DAILY 180 tablet 3   glucose blood (ACCU-CHEK GUIDE) test strip 1 each by Other route in the morning, at noon, and at bedtime. Use to test glucose levels 3 times daily 200 strip 3   JARDIANCE 25 MG TABS tablet TAKE 1 TABLET BY MOUTH DAILY 90 tablet 2   metFORMIN (GLUCOPHAGE-XR) 500 MG 24 hr tablet TAKE 2 TABLETS(1000 MG) BY MOUTH TWICE DAILY 360 tablet 3   montelukast (SINGULAIR) 10 MG tablet TAKE 1 TABLET(10 MG) BY MOUTH AT BEDTIME 90 tablet 3   mupirocin ointment (BACTROBAN) 2 % Apply 1 Application topically daily. 22 g 0   omeprazole (PRILOSEC) 20 MG capsule Take 1 capsule (20 mg total) by mouth daily. 30 capsule 3   sertraline (ZOLOFT) 100 MG tablet Take 1.5 tablets (150 mg total) by mouth daily. 45 tablet 2   simvastatin (ZOCOR) 20 MG tablet TAKE 1 TABLET(20 MG) BY MOUTH EVERY EVENING 90 tablet 3   tadalafil (CIALIS) 20 MG tablet TAKE ONE-HALF TO ONE TABLET BY MOUTH ONE TIME EVERY OTHER DAY AS NEEDED FOR  ERECTILE DYSFUNCTION 10 tablet 2   traZODone (DESYREL) 50 MG tablet  Take 1 tablet (50 mg total) by mouth at bedtime as needed for sleep. 30 tablet 0   TYRVAYA 0.03 MG/ACT SOLN Spray 1 spray in each nostril twice daily, approximately 12 hours apart     Current Facility-Administered Medications  Medication Dose Route Frequency Provider Last Rate Last Admin   methylPREDNISolone acetate (DEPO-MEDROL) injection 80 mg  80 mg Other Once Tyrell Antonio, MD        Objective: Psychiatric Specialty Exam: Blood pressure 100/68, pulse 91, height 5' 9.25" (1.759 m), weight 195 lb (88.5 kg), SpO2 95%.Body mass index is 28.59 kg/m.  General Appearance: Casual, faily groomed  Eye Contact:  Good    Speech:  Clear, coherent, normal rate   Volume:  Normal   Mood:  "still annoyed but better"  Affect:  Appropriate, congruent, blunted  Thought Content: Logical, rumination  Suicidal Thoughts: Denied active and passive SI    Thought Process:  Coherent, goal-directed, linear   Orientation:  A&Ox4   Memory:  Immediate good  Judgment:  Fair   Insight:  Shallow  Concentration:  Attention and concentration good  Recall:  immediate good, delayed poor   Fund of Knowledge: Good  Language: Good, fluent  Psychomotor Activity: Intermittent tremors/forearm twitching on the right, not constant. No tremor when asked to stick both hands out or picking something up b/l.   Akathisia:  NA   AIMS (if indicated): NA  Assets:  Communication Skills Desire for Improvement Housing Leisure Time Physical Health Resilience Social Support Talents/Skills Transportation Vocational/Educational  ADL's:  Intact  Cognition: WNL  Sleep:  Fair    PE: General: well-appearing; no acute distress  Pulm: no increased work of breathing on room air  Strength & Muscle Tone: within normal limits Neuro: no focal neurological deficits observed  Gait & Station: normal  Metabolic Disorder Labs: Lab Results  Component Value Date    HGBA1C 7.4 (H) 02/20/2023   MPG 123 03/02/2018   No results found for: "PROLACTIN" Lab Results  Component Value Date   CHOL 115 11/09/2022   TRIG 60.0 11/09/2022   HDL 52.50 11/09/2022   CHOLHDL 2 11/09/2022   VLDL 12.0 11/09/2022   LDLCALC 50 11/09/2022   LDLCALC 58 07/26/2022   Lab Results  Component Value Date   TSH 1.02 07/08/2019   TSH 0.92 03/15/2018    Therapeutic Level Labs: No results found for: "LITHIUM" No results found for: "VALPROATE" No results found for: "CBMZ"  Screenings: GAD-7    Flowsheet Row Office Visit from 07/26/2022 in [facility] [employer]  Total GAD-7 Score 5      PHQ2-9    Flowsheet Row Counselor from 03/08/2023 in [facility] Counselor from 12/29/2022 in [facility] Visit from 11/17/2022 in [facility] Visit from 11[facility]09/2022 in [facility] [employer] Clinical Support from 10/03/2022 in [facility] [employer]  PHQ-2 Total Score 0 0 0 0 0      Flowsheet Row ED from 03/31/2023 in [facility] ED from 03/19/2023 in [facility] from 03/08/2023 in [facility] at [facility] RISK CATEGORY No Risk No Risk No Risk       Patient/Guardian was advised Release of Information must be obtained prior to any record release in order to collaborate their care with an outside provider.  Patient/Guardian was advised if they have not already done so to contact the registration department to sign all necessary forms in order for [place] to release information regarding their care.   Consent: Patient/Guardian gives verbal consent for treatment and assignment of benefits for services provided during this visit. Patient/Guardian expressed understanding and agreed to  proceed.   Princess Bruins, DO Psych Resident, PGY-3

## 2023-05-11 NOTE — Addendum Note (Signed)
Addended by: Everlena Cooper on: 05/11/2023 10:12 AM   Modules accepted: Level of Service

## 2023-05-17 ENCOUNTER — Encounter: Payer: Self-pay | Admitting: Dermatology

## 2023-05-17 ENCOUNTER — Encounter: Payer: Self-pay | Admitting: Family Medicine

## 2023-05-17 ENCOUNTER — Other Ambulatory Visit: Payer: Self-pay

## 2023-05-17 ENCOUNTER — Ambulatory Visit (INDEPENDENT_AMBULATORY_CARE_PROVIDER_SITE_OTHER): Payer: Medicare Other | Admitting: Dermatology

## 2023-05-17 VITALS — BP 101/66 | HR 93

## 2023-05-17 DIAGNOSIS — L810 Postinflammatory hyperpigmentation: Secondary | ICD-10-CM

## 2023-05-17 DIAGNOSIS — E1165 Type 2 diabetes mellitus with hyperglycemia: Secondary | ICD-10-CM

## 2023-05-17 MED ORDER — ACCU-CHEK GUIDE VI STRP: 1.0000 | ORAL_STRIP | Freq: Three times a day (TID) | 3 refills | Status: DC

## 2023-05-17 NOTE — Patient Instructions (Addendum)
Hello Keyan,  Thank you for visiting my office today. Your dedication to understanding and enhancing your skin health is greatly appreciated.  Here is a summary of the essential instructions from our consultation today:  - Post-Inflammatory Hyperpigmentation: The hyperpigmented patch on your right lateral ankle is identified as post-inflammatory hyperpigmentation, a result of a previous fungal infection. This condition is benign, though it may take some time to fade.  - Treatment: To assist in expediting the fading of the dark spot, it is recommended to use cerevesalicylic acid. This will aid in exfoliating the skin.  - Follow-Up: We will reevaluate the condition in six months. Should you experience any itching, scaling, or other new symptoms, please return sooner.  It was a pleasure meeting you, and I eagerly anticipate our next appointment to monitor your progress.  Best regards,  Dr. Langston Reusing [facility]      Important Information   Due to recent changes in healthcare laws, you may see results of your pathology and/or laboratory studies on MyChart before the doctors have had a chance to review them. We understand that in some cases there may be results that are confusing or concerning to you. Please understand that not all results are received at the same time and often the doctors may need to interpret multiple results in order to provide you with the best plan of care or course of treatment. Therefore, we ask that you please give [place] 2 business days to thoroughly review all your results before contacting the office for clarification. Should we see a critical lab result, you will be contacted sooner.     If You Need Anything After Your Visit   If you have any questions or concerns for your doctor, please call our main line at [phone]. If no one answers, please leave a voicemail as directed and we will return your call as soon as possible. Messages left after 4 pm will be  answered the following business day.    You may also send [place] a message via MyChart. We typically respond to MyChart messages within 1-2 business days.  For prescription refills, please ask your pharmacy to contact our office. Our fax number is [phone].  If you have an urgent issue when the clinic is closed that cannot wait until the next business day, you can page your doctor at the number below.     Please note that while we do our best to be available for urgent issues outside of office hours, we are not available 24/7.    If you have an urgent issue and are unable to reach [place], you may choose to seek medical care at your doctor's office, retail clinic, urgent care center, or emergency room.   If you have a medical emergency, please immediately call 911 or go to the emergency department. In the event of inclement weather, please call our main line at [phone] for an update on the status of any delays or closures.  Dermatology Medication Tips: Please keep the boxes that topical medications come in in order to help keep track of the instructions about where and how to use these. Pharmacies typically print the medication instructions only on the boxes and not directly on the medication tubes.   If your medication is too expensive, please contact our office at [phone] or send [place] a message through MyChart.    We are unable to tell what your co-pay for medications will be in advance as this is different depending on your  insurance coverage. However, we may be able to find a substitute medication at lower cost or fill out paperwork to get insurance to cover a needed medication.    If a prior authorization is required to get your medication covered by your insurance company, please allow [place] 1-2 business days to complete this process.   Drug prices often vary depending on where the prescription is filled and some pharmacies may offer cheaper prices.   The website www.goodrx.com  contains coupons for medications through different pharmacies. The prices here do not account for what the cost may be with help from insurance (it may be cheaper with your insurance), but the website can give you the price if you did not use any insurance.  - You can print the associated coupon and take it with your prescription to the pharmacy.  - You may also stop by our office during regular business hours and pick up a GoodRx coupon card.  - If you need your prescription sent electronically to a different pharmacy, notify our office through [facility] or by phone at [phone]

## 2023-05-17 NOTE — Progress Notes (Signed)
   New Patient Visit   Subjective  Glenn Robertson is a 57 y.o. male who presents for the following: rash on ankle  Pt has a rash on the side of his right ankle for about 1 yr. It does not itch but it gets bothersome. Pt has tried lotions and vaseline but that hasn't helped. Pt does not remember having area evaluated in the past nor getting topical for it.  The following portions of the chart were reviewed this encounter and updated as appropriate: medications, allergies, medical history  Review of Systems:  No other skin or systemic complaints except as noted in HPI or Assessment and Plan.  Objective  Well appearing patient in no apparent distress; mood and affect are within normal limits.   A focused examination was performed of the following areas: Right ankle  Relevant exam findings are noted in the Assessment and Plan.  Exam: Lichenified hyperpigmented plaque on right lateral ankle       Assessment & Plan   POST-INFLAMMATORY HYPERPIGMENTATION (PIH) Most likely from post tinea corporis   This is a benign condition that comes from having previous inflammation in the skin and will fade with time over months to sometimes years. Recommend daily sun protection including sunscreen SPF 30+ to sun-exposed areas. - Recommend treating any itchy or red areas on the skin quickly to prevent new areas of PIH. Treating with prescription medicines such as hydroquinone may help fade dark spots faster.    Treatment Plan:  samples given of cerave moisturizer with sal acid      No follow-ups on file.  Owens Shark, CMA, am acting as scribe for [employer], DO.   Documentation: I have reviewed the above documentation for accuracy and completeness, and I agree with the above.  Langston Reusing, DO

## 2023-05-22 DIAGNOSIS — L97512 Non-pressure chronic ulcer of other part of right foot with fat layer exposed: Secondary | ICD-10-CM | POA: Diagnosis not present

## 2023-05-23 ENCOUNTER — Encounter: Payer: Medicare Other | Admitting: Family Medicine

## 2023-06-05 DIAGNOSIS — L97512 Non-pressure chronic ulcer of other part of right foot with fat layer exposed: Secondary | ICD-10-CM | POA: Diagnosis not present

## 2023-06-10 ENCOUNTER — Other Ambulatory Visit: Payer: Self-pay | Admitting: Family Medicine

## 2023-06-13 ENCOUNTER — Telehealth: Payer: Self-pay | Admitting: Family Medicine

## 2023-06-13 DIAGNOSIS — Z23 Encounter for immunization: Secondary | ICD-10-CM | POA: Diagnosis not present

## 2023-06-13 NOTE — Telephone Encounter (Signed)
error 

## 2023-06-19 ENCOUNTER — Ambulatory Visit: Payer: Medicare Other | Admitting: Family Medicine

## 2023-06-26 ENCOUNTER — Telehealth (HOSPITAL_COMMUNITY): Payer: Self-pay | Admitting: Student

## 2023-06-26 ENCOUNTER — Telehealth (HOSPITAL_COMMUNITY): Payer: Self-pay

## 2023-06-26 NOTE — Telephone Encounter (Signed)
Patient called with questions about his medication, I was able to answer his questions but he was also concerned about side effects/ Patient states he is very restless and feels like he can not sit still. Patient would like a return call, he has a follow up at the end of November.

## 2023-06-26 NOTE — Telephone Encounter (Signed)
Patient called and asked about changing his medication. Patient asked for clarification on which medication is for paranoia. Patient's next appointment is November 25.-Please advise

## 2023-06-27 DIAGNOSIS — L97511 Non-pressure chronic ulcer of other part of right foot limited to breakdown of skin: Secondary | ICD-10-CM | POA: Diagnosis not present

## 2023-06-27 NOTE — Telephone Encounter (Signed)
Patient called about his vraylar causing restlessness and if there is anything to help that.  He said that the restlessness has not changed since last encounter, but has always been there.  Discussed with patient that there are multiple different options that can be discussed at the next appointment since the restlessness has not changed in months. Patient amenable to plan and had no further questions at this time.

## 2023-06-29 ENCOUNTER — Telehealth (HOSPITAL_COMMUNITY): Payer: Self-pay

## 2023-06-29 ENCOUNTER — Telehealth (HOSPITAL_COMMUNITY): Payer: Self-pay | Admitting: Student

## 2023-06-29 DIAGNOSIS — F3341 Major depressive disorder, recurrent, in partial remission: Secondary | ICD-10-CM

## 2023-06-29 DIAGNOSIS — F200 Paranoid schizophrenia: Secondary | ICD-10-CM

## 2023-06-29 NOTE — Telephone Encounter (Signed)
Patient is calling for a refill on Prolixin he has a follow up in November. Please review and advise, thank you

## 2023-06-30 MED ORDER — VRAYLAR 6 MG PO CAPS
6.0000 mg | ORAL_CAPSULE | Freq: Every day | ORAL | 0 refills | Status: DC
Start: 2023-06-30 — End: 2023-07-24

## 2023-06-30 MED ORDER — AMANTADINE HCL 100 MG PO CAPS: 100.0000 mg | ORAL_CAPSULE | Freq: Two times a day (BID) | ORAL | 0 refills | Status: DC

## 2023-06-30 MED ORDER — SERTRALINE HCL 100 MG PO TABS
150.0000 mg | ORAL_TABLET | Freq: Every day | ORAL | 0 refills | Status: DC
Start: 2023-06-30 — End: 2023-07-24

## 2023-06-30 MED ORDER — FLUPHENAZINE HCL 5 MG PO TABS
7.5000 mg | ORAL_TABLET | Freq: Every day | ORAL | 0 refills | Status: DC
Start: 2023-06-30 — End: 2023-07-30

## 2023-06-30 NOTE — Telephone Encounter (Signed)
Recent Visits Date Type Provider Dept  05/10/23 Office Visit Princess Bruins, DO [facility]  04/12/23 Office Visit Princess Bruins, DO [facility]  Showing recent visits within past 90 days and meeting all other requirements Future Appointments Date Type Provider Dept  07/24/23 Appointment Princess Bruins, DO [facility]  Showing future appointments within next 90 days and meeting all other requirements   Refilled patient's rx per request to bridge until next appointment:  Paranoid schizophrenia, chronic condition (HCC) -     Amantadine HCl; Take 1 capsule (100 mg total) by mouth 2 (two) times daily. Take 2 capsules in the morning (200 mg) and one capsule (100 mg) in the evening  Dispense: 60 capsule; Refill: 0 -     fluPHENAZine HCl; Take 1.5 tablets (7.5 mg total) by mouth at bedtime. Take one tablet ( 5 mgr)  at bedtime  Dispense: 45 tablet; Refill: 0 -     Vraylar; Take 1 capsule (6 mg total) by mouth daily. TAKE 1 CAPSULE(6 MG) BY MOUTH DAILY  Dispense: 30 capsule; Refill: 0  Recurrent major depressive disorder, in partial remission (HCC) -     Sertraline HCl; Take 1.5 tablets (150 mg total) by mouth daily.  Dispense: 45 tablet; Refill: 0

## 2023-07-03 ENCOUNTER — Ambulatory Visit (HOSPITAL_COMMUNITY): Payer: Medicare Other | Admitting: Student

## 2023-07-05 ENCOUNTER — Telehealth (HOSPITAL_COMMUNITY): Payer: Self-pay | Admitting: Student

## 2023-07-05 ENCOUNTER — Telehealth (HOSPITAL_COMMUNITY): Payer: Self-pay | Admitting: *Deleted

## 2023-07-05 DIAGNOSIS — F200 Paranoid schizophrenia: Secondary | ICD-10-CM

## 2023-07-05 MED ORDER — FLUPHENAZINE HCL 5 MG PO TABS: 7.5000 mg | ORAL_TABLET | Freq: Every day | ORAL | 0 refills | Status: DC

## 2023-07-05 NOTE — Telephone Encounter (Signed)
Refilled rx, per request - prolixin 7.5 mg at bedtime as bridge until next appointment

## 2023-07-05 NOTE — Telephone Encounter (Signed)
Received VM from [facility] requesting clarification of the SIG on the Prolixin script. Please review. Thanks.

## 2023-07-05 NOTE — Telephone Encounter (Signed)
Patient called and stated they need a refill on Fluphenazine medication. Patient's next appointment is on 11/25.

## 2023-07-06 MED ORDER — FLUPHENAZINE HCL 5 MG PO TABS: 7.5000 mg | ORAL_TABLET | Freq: Every day | ORAL | 0 refills | Status: DC

## 2023-07-06 NOTE — Telephone Encounter (Signed)
Thanks Raynelle Fanning! SIG is the administration instructions btw.

## 2023-07-06 NOTE — Telephone Encounter (Signed)
Recent Visits Date Type Provider Dept  05/10/23 Office Visit Princess Bruins, DO [facility]  04/12/23 Office Visit Princess Bruins, DO [facility]  Showing recent visits within past 90 days and meeting all other requirements Future Appointments Date Type Provider Dept  07/24/23 Appointment Princess Bruins, DO [facility]  Showing future appointments within next 90 days and meeting all other requirements   Refilled patient's rx per request to bridge until next appointment:  Glenn Robertson was seen today for medication management.  Paranoid schizophrenia, chronic condition (HCC) -     fluPHENAZine HCl; Take 1.5 tablets (7.5 mg total) by mouth at bedtime.  Dispense: 45 tablet; Refill: 0

## 2023-07-07 ENCOUNTER — Other Ambulatory Visit: Payer: Self-pay | Admitting: Family Medicine

## 2023-07-18 ENCOUNTER — Ambulatory Visit (INDEPENDENT_AMBULATORY_CARE_PROVIDER_SITE_OTHER): Payer: Medicare Other | Admitting: Family Medicine

## 2023-07-18 ENCOUNTER — Encounter: Payer: Self-pay | Admitting: Family Medicine

## 2023-07-18 VITALS — BP 116/76 | HR 85 | Temp 97.9°F | Ht 69.0 in | Wt 192.2 lb

## 2023-07-18 DIAGNOSIS — E7800 Pure hypercholesterolemia, unspecified: Secondary | ICD-10-CM | POA: Diagnosis not present

## 2023-07-18 DIAGNOSIS — E119 Type 2 diabetes mellitus without complications: Secondary | ICD-10-CM | POA: Diagnosis not present

## 2023-07-18 DIAGNOSIS — Z7984 Long term (current) use of oral hypoglycemic drugs: Secondary | ICD-10-CM | POA: Diagnosis not present

## 2023-07-18 DIAGNOSIS — E538 Deficiency of other specified B group vitamins: Secondary | ICD-10-CM

## 2023-07-18 DIAGNOSIS — M71571 Other bursitis, not elsewhere classified, right ankle and foot: Secondary | ICD-10-CM | POA: Diagnosis not present

## 2023-07-18 DIAGNOSIS — J029 Acute pharyngitis, unspecified: Secondary | ICD-10-CM | POA: Diagnosis not present

## 2023-07-18 LAB — BASIC METABOLIC PANEL WITH GFR
BUN: 17 mg/dL (ref 6–23)
CO2: 24 meq/L (ref 19–32)
Calcium: 9.6 mg/dL (ref 8.4–10.5)
Chloride: 102 meq/L (ref 96–112)
Creatinine, Ser: 0.97 mg/dL (ref 0.40–1.50)
GFR: 87.05 mL/min
Glucose, Bld: 231 mg/dL — ABNORMAL HIGH (ref 70–99)
Potassium: 3.7 meq/L (ref 3.5–5.1)
Sodium: 136 meq/L (ref 135–145)

## 2023-07-18 LAB — LDL CHOLESTEROL, DIRECT: Direct LDL: 52 mg/dL

## 2023-07-18 LAB — VITAMIN B12: Vitamin B-12: 984 pg/mL — ABNORMAL HIGH (ref 211–911)

## 2023-07-18 LAB — HEMOGLOBIN A1C: Hgb A1c MFr Bld: 7.6 % — ABNORMAL HIGH (ref 4.6–6.5)

## 2023-07-18 LAB — POCT RAPID STREP A (OFFICE): Rapid Strep A Screen: NEGATIVE

## 2023-07-18 NOTE — Progress Notes (Signed)
Established Patient Office Visit   Subjective:  Patient ID: Glenn Robertson, male    DOB: 30-Mar-1966  Age: 57 y.o. MRN: [id number]  Chief Complaint  Patient presents with   Sore Throat    Pt complains of sore throat x 1 week.    Wound Check    Recheck right great toe ulcer.     Sore Throat  Pertinent negatives include no abdominal pain, congestion, headaches or vomiting.  Wound Check   Encounter Diagnoses  Name Primary?   Sore throat Yes   Type 2 diabetes mellitus without complication, without long-term current use of insulin (HCC)    Elevated cholesterol    Vitamin B 12 deficiency    For follow-up of above.  Glenn Robertson is experienced some soreness on the roof of Glenn Robertson mouth over the last week or so.  There is been no headache fevers or chills or respiratory tract symptoms.  Continues with Jardiance and metformin for type 2 diabetes.  Continue simvastatin for elevated cholesterol.  Just saw the podiatrist today who trimmed callus on Glenn Robertson right great toe.  Lost to follow-up for type 2 diabetes since June of this past year.   Review of Systems  Constitutional: Negative.  Negative for chills and fever.  HENT:  Positive for sore throat. Negative for congestion.   Eyes:  Negative for blurred vision, discharge and redness.  Respiratory: Negative.    Cardiovascular: Negative.   Gastrointestinal:  Negative for abdominal pain, nausea and vomiting.  Genitourinary: Negative.   Musculoskeletal: Negative.  Negative for myalgias.  Skin:  Negative for rash.  Neurological:  Negative for tingling, loss of consciousness, weakness and headaches.  Endo/Heme/Allergies:  Negative for polydipsia.     Current Outpatient Medications:    ACCU-CHEK SOFTCLIX LANCETS lancets, 3 (three) times daily. for testing, Disp: , Rfl: 0   albuterol (VENTOLIN HFA) 108 (90 Base) MCG/ACT inhaler, Inhale 1-2 puffs into the lungs every 6 (six) hours as needed for wheezing or shortness of breath., Disp: 18 g, Rfl: 0    amantadine (SYMMETREL) 100 MG capsule, Take 1 capsule (100 mg total) by mouth 2 (two) times daily. Take 2 capsules in the morning (200 mg) and one capsule (100 mg) in the evening, Disp: 60 capsule, Rfl: 0   Blood Glucose Monitoring Suppl (ACCU-CHEK GUIDE ME) w/Device KIT, USE AS DIRECTED, Disp: 1 kit, Rfl: 0   Cariprazine HCl (VRAYLAR) 6 MG CAPS, Take 1 capsule (6 mg total) by mouth daily. TAKE 1 CAPSULE(6 MG) BY MOUTH DAILY, Disp: 30 capsule, Rfl: 0   chlorhexidine (HIBICLENS) 4 % external liquid, Apply topically daily as needed., Disp: 118 mL, Rfl: 0   clonazePAM (KLONOPIN) 1 MG tablet, for anxiety, please take 1/2 tablet (0.5 mg) in the morning and 1 tablet (1 mg) at night, Disp: 60 tablet, Rfl: 2   fexofenadine (ALLEGRA ODT) 30 MG disintegrating tablet, Take 30 mg by mouth daily., Disp: , Rfl:    fluPHENAZine (PROLIXIN) 5 MG tablet, Take 1.5 tablets (7.5 mg total) by mouth at bedtime., Disp: 45 tablet, Rfl: 0   fluticasone (FLONASE) 50 MCG/ACT nasal spray, SHAKE LIQUID AND USE 2 SPRAYS IN EACH NOSTRIL DAILY, Disp: 16 g, Rfl: 6   glipiZIDE (GLUCOTROL) 10 MG tablet, TAKE 1 TABLET(10 MG) BY MOUTH TWICE DAILY, Disp: 180 tablet, Rfl: 3   glucose blood (ACCU-CHEK GUIDE) test strip, 1 each by Other route in the morning, at noon, and at bedtime. Use to test glucose levels 3 times daily, Disp: 200  strip, Rfl: 3   JARDIANCE 25 MG TABS tablet, TAKE 1 TABLET BY MOUTH DAILY, Disp: 90 tablet, Rfl: 2   metFORMIN (GLUCOPHAGE-XR) 500 MG 24 hr tablet, TAKE 2 TABLETS(1000 MG) BY MOUTH TWICE DAILY, Disp: 360 tablet, Rfl: 3   montelukast (SINGULAIR) 10 MG tablet, TAKE 1 TABLET(10 MG) BY MOUTH AT BEDTIME, Disp: 90 tablet, Rfl: 3   mupirocin ointment (BACTROBAN) 2 %, Apply 1 Application topically daily., Disp: 22 g, Rfl: 0   omeprazole (PRILOSEC) 20 MG capsule, Take 1 capsule (20 mg total) by mouth daily., Disp: 30 capsule, Rfl: 3   sertraline (ZOLOFT) 100 MG tablet, Take 1.5 tablets (150 mg total) by mouth daily.,  Disp: 45 tablet, Rfl: 0   simvastatin (ZOCOR) 20 MG tablet, TAKE 1 TABLET(20 MG) BY MOUTH EVERY EVENING, Disp: 90 tablet, Rfl: 3   tadalafil (CIALIS) 20 MG tablet, TAKE ONE-HALF TO ONE TABLET BY MOUTH ONE TIME EVERY OTHER DAY AS NEEDED FOR FOR ERECTILE DYSFUNCTION, Disp: 10 tablet, Rfl: 2   traZODone (DESYREL) 50 MG tablet, Take 1 tablet (50 mg total) by mouth at bedtime as needed for sleep., Disp: 30 tablet, Rfl: 0   TYRVAYA 0.03 MG/ACT SOLN, Spray 1 spray in each nostril twice daily, approximately 12 hours apart, Disp: , Rfl:   Current Facility-Administered Medications:    methylPREDNISolone acetate (DEPO-MEDROL) injection 80 mg, 80 mg, Other, Once, Tyrell Antonio, MD   Objective:     BP 116/76   Pulse 85   Temp 97.9 F (36.6 C)   Ht 5\' 9"  (1.753 m)   Wt 192 lb 3.2 oz (87.2 kg)   SpO2 96%   BMI 28.38 kg/m    Physical Exam Constitutional:      General: Glenn Robertson is not in acute distress.    Appearance: Normal appearance. Glenn Robertson is not ill-appearing, toxic-appearing or diaphoretic.  HENT:     Head: Normocephalic and atraumatic.     Right Ear: Tympanic membrane, ear canal and external ear normal.     Left Ear: Tympanic membrane, ear canal and external ear normal.     Mouth/Throat:     Mouth: Mucous membranes are moist. No oral lesions.     Pharynx: Oropharynx is clear. No pharyngeal swelling, oropharyngeal exudate or posterior oropharyngeal erythema.     Tonsils: No tonsillar exudate or tonsillar abscesses. 0 on the right. 0 on the left.  Eyes:     General: No scleral icterus.       Right eye: No discharge.        Left eye: No discharge.     Extraocular Movements: Extraocular movements intact.     Conjunctiva/sclera: Conjunctivae normal.     Pupils: Pupils are equal, round, and reactive to light.  Cardiovascular:     Rate and Rhythm: Normal rate and regular rhythm.     Pulses:          Dorsalis pedis pulses are 2+ on the right side and 2+ on the left side.       Posterior tibial  pulses are 2+ on the right side and 2+ on the left side.     Heart sounds: Normal heart sounds.  Pulmonary:     Effort: Pulmonary effort is normal. No respiratory distress.     Breath sounds: Normal breath sounds. No wheezing, rhonchi or rales.  Abdominal:     General: Bowel sounds are normal.     Tenderness: There is no abdominal tenderness. There is no guarding.  Musculoskeletal:  Cervical back: No rigidity or tenderness.  Skin:    General: Skin is warm and dry.  Neurological:     Mental Status: Glenn Robertson is alert and oriented to person, place, and time.  Psychiatric:        Mood and Affect: Mood normal.        Behavior: Behavior normal.    Diabetic Foot Exam - Simple   Simple Foot Form Diabetic Foot exam was performed with the following findings: Yes 07/18/2023 11:30 AM  Visual Inspection See comments: Yes Sensation Testing See comments: Yes Pulse Check Posterior Tibialis and Dorsalis pulse intact bilaterally: Yes Comments Feet are pes planus bilaterally.  There are no lesions or ulcerations.  On the plantar surface of the right great toe is a 1 cm circular zone consistent with recent debridement of the callus.  Reports longstanding history of numbness like paresthesias in Glenn Robertson lateral feet.      Results for orders placed or performed in visit on 07/18/23  POCT rapid strep A  Result Value Ref Range   Rapid Strep A Screen Negative Negative      The ASCVD Risk score (Arnett DK, et al., 2019) failed to calculate for the following reasons:   The valid total cholesterol range is 130 to 320 mg/dL    Assessment & Plan:   Sore throat -     POCT rapid strep A  Type 2 diabetes mellitus without complication, without long-term current use of insulin (HCC) -     Basic metabolic panel -     Hemoglobin A1c  Elevated cholesterol -     LDL cholesterol, direct  Vitamin B 12 deficiency -     Vitamin B12    Return in about 6 months (around 01/15/2024), or if symptoms worsen  or fail to improve.  Sore throat should clear on its own.  Strep test negative.  Follow-up on diabetes, elevated cholesterol and B12 deficiency.  Mliss Sax, MD

## 2023-07-19 ENCOUNTER — Telehealth: Payer: Self-pay | Admitting: *Deleted

## 2023-07-19 NOTE — Patient Outreach (Signed)
  Care Coordination   Follow Up Visit Note   07/19/2023 Name: Glenn Robertson MRN: [id number] DOB: February 13, 1966  Glenn Robertson is a 57 y.o. year old male who sees Mliss Sax, MD for primary care.     Goals Addressed             This Visit's Progress    Receive DM education mailers every quarter       Interventions Today    Flowsheet Row Most Recent Value  Chronic Disease   Chronic disease during today's visit Diabetes  General Interventions   General Interventions Discussed/Reviewed Annual Eye Exam, Annual Foot Exam  Education Interventions   Education Provided Provided Web-based Education, Provided Education  Provided Verbal Education On Exercise, Eye Care, Foot Care              SDOH assessments and interventions completed:  No     Care Coordination Interventions:  Yes, provided   Follow up plan:  Mail diabetes education next quarter    Encounter Outcome:  Patient Visit Completed   Glenn Langton, RN, MSN, CCM [place]  [facility], [facility] RN Care Coordinator Direct Dial: [phone] / Main [phone] Fax [phone] Email: [email or web address]: [email or web address]

## 2023-07-19 NOTE — Patient Instructions (Signed)
Visit Information  Thank you for taking time to visit with me today. Please don't hesitate to contact me if I can be of assistance to you.  Please call the care guide team at [phone] if you need to cancel or reschedule your appointment.   Please call the Suicide and Crisis Lifeline: 988 call the [place] National Suicide Prevention Lifeline: (229) 860-3066 or TTY: 475-867-5688 TTY (408)082-3386) to talk to a trained counselor call 1-800-273-TALK (toll free, 24 hour hotline) call 911 if you are experiencing a Mental Health or Behavioral Health Crisis or need someone to talk to.  Patient verbalizes understanding of instructions and care plan provided today and agrees to view in MyChart. Active MyChart status and patient understanding of how to access instructions and care plan via MyChart confirmed with patient.     The patient has been provided with contact information for the care management team and has been advised to call with any health related questions or concerns.   Rodney Langton, RN, MSN, CCM [facility], [facility] RN Care Coordinator Direct Dial: [phone] / Main [phone] Fax [phone] Email: [email or web address]

## 2023-07-24 ENCOUNTER — Other Ambulatory Visit: Payer: Self-pay | Admitting: Family Medicine

## 2023-07-24 ENCOUNTER — Ambulatory Visit (HOSPITAL_BASED_OUTPATIENT_CLINIC_OR_DEPARTMENT_OTHER): Payer: Medicare Other | Admitting: Student

## 2023-07-24 ENCOUNTER — Encounter (HOSPITAL_COMMUNITY): Payer: Self-pay | Admitting: Student

## 2023-07-24 DIAGNOSIS — F200 Paranoid schizophrenia: Secondary | ICD-10-CM | POA: Diagnosis not present

## 2023-07-24 DIAGNOSIS — F3341 Major depressive disorder, recurrent, in partial remission: Secondary | ICD-10-CM | POA: Diagnosis not present

## 2023-07-24 DIAGNOSIS — R052 Subacute cough: Secondary | ICD-10-CM

## 2023-07-24 DIAGNOSIS — Z79899 Other long term (current) drug therapy: Secondary | ICD-10-CM | POA: Diagnosis not present

## 2023-07-24 MED ORDER — SERTRALINE HCL 100 MG PO TABS
150.0000 mg | ORAL_TABLET | Freq: Every day | ORAL | 2 refills | Status: DC
Start: 2023-07-24 — End: 2023-09-04

## 2023-07-24 MED ORDER — AMANTADINE HCL 100 MG PO CAPS
100.0000 mg | ORAL_CAPSULE | Freq: Two times a day (BID) | ORAL | 2 refills | Status: DC
Start: 2023-07-24 — End: 2023-10-02

## 2023-07-24 MED ORDER — FLUPHENAZINE HCL 5 MG PO TABS: 7.5000 mg | ORAL_TABLET | Freq: Every day | ORAL | 2 refills | Status: DC

## 2023-07-24 MED ORDER — CLONAZEPAM 0.5 MG PO TABS
0.5000 mg | ORAL_TABLET | Freq: Two times a day (BID) | ORAL | 0 refills | Status: DC
Start: 2023-07-24 — End: 2023-09-13

## 2023-07-24 MED ORDER — VRAYLAR 6 MG PO CAPS
6.0000 mg | ORAL_CAPSULE | Freq: Every day | ORAL | 2 refills | Status: DC
Start: 2023-07-24 — End: 2023-09-04

## 2023-07-24 NOTE — Addendum Note (Signed)
Addended by: Everlena Cooper on: 07/24/2023 02:35 PM   Modules accepted: Level of Service

## 2023-07-24 NOTE — Progress Notes (Signed)
BH MD Outpatient Progress Note  07/24/2023 11:00 AM ZICHEN GADD  MRN: [id number]  Assessment:  Glenn Robertson presents for follow-up evaluation in-person.   Identifying Information: Glenn Robertson is a 57 y.o. male with a history of paranoid schizophrenia, MDD, no suicide attempt or inpatient psych admission, who is an established patient with [facility] for management of delusions and depressed mood.   Risk Assessment: An assessment of suicide and violence risk factors was performed as part of this evaluation and is not significantly changed from the last visit.             While future psychiatric events cannot be accurately predicted, the patient does not currently require acute inpatient psychiatric care and does not currently meet [facility] involuntary commitment criteria.          Plan:  # Paranoid schizophrenia vs delusional d/o (previously schizoaffective d/o with depressed mood)   Chronic benzodiazepine use Past medication trials:  Status of problem:  Reported persecutory vs paranoid delusions that he reported is unchanged initially. On further conversation, patient reported that he has noticed his stalkers multiple times at the grocery store. The stalker he is talking about has moved away, he does not know where they are. Per previous f/u note (see Dr. Camila Li note 02/02/2023), patient reported no paranoia (seeing stalkers). However during 04/12/2023 encounter, he endorsed seeing stalkers per above, last week, but on further chart review, patient has been a poor historian.  Questioning dx of paranoid schizophrenia (previously schizoaffective d/o) vs delusional d/o, given only sxs of delusions. However per chart review, patient had flat affect, grandiose delusions, bizarre perceptual disturbances which is more consistent with scz/scza rather delusional d/o. Will continue assess to better clarify dx.  Unclear reason for patient's klonopin, suspect for  anxiety related paranoia. He denied anxiety outside of the delusion. For this reason, will work on tapering patient off klonopin slowly.  AIMS 0 (04/12/2023) Return of paranoia when off of prolixin, restarting it. Still blunted affect. Otherwise no other sxs.  No worsening sleep or anxiety when decreased klonopin, will continue taper per below. Interventions: Continued home vraylar 6 mg daily Continued home prolixin 7.5 mg qHS DECREASED home klonopin 0.5 mg qAM + 1 mg qPM to 0.5 mg BID PCP for repeat EKG  #EPS - dystonia  Action Tremors Past medication trials:  Status of problem:  AIMS 0 (04/12/2023), no dystonia on physical exam (04/12/2023). Does have action tremor b/l, none at rest. Suspect multifactorial 2/2 meds, DM2, essential tremor, possible neurocognitive d/o given issues with memory (2/3 delayed recall). Otherwise was A&Ox4, DOWB without difficulties. Will continue to assess.  Interventions:  Continued home amantadine 100 mg BID  # MDD Past medication trials:  Status of problem:  Depressed mood is much improved, evident by increased motivation to resume his old hobbies. He rarely uses trazodone.  Interventions: Continued home zoloft 150 mg daily Continued home trazodone 50 mg at bedtime PRN  Health Maintenance PCP: Mliss Sax, MD  DM2 - metformin 1000 mg BID, jardiance 25 mg, glipizide 10 mg BID Exercised induced asthma - singulair 10 mg daily, allegra daily, tyrvaya nasal spray BID HLD - simvastatin 20 mg daily ED - cialis 20 mg PRN  Return to care in: Future Appointments  Date Time Provider Department Center  09/06/2023  3:30 PM Princess Bruins, DO [facility] None  10/06/2023  9:45 AM [facility]-ANNUAL WELLNESS VISIT [facility]  11/14/2023 10:15 AM Terri Piedra, DO [facility]  None   Patient was given contact information for behavioral health clinic and was instructed to call 911 for emergencies.   Patient and plan of care will be discussed with the Attending MD,  Dr. Mercy Riding, who agrees with the above statement and plan.   Subjective:  Chief Complaint:  Chief Complaint  Patient presents with   Schizophrenia   Interval History:   Unaccompanied.  Foot is better per podiatry. Running hurts his knee, but has been doing other form of work out.    Concentration is still a problem, unable to complete shows, books, activities. Has to get up and do something else. He calls this feeling "restlessness".  He felt the restlessness here in the office during the appointment. No physical signs of restlessness.   Has been off of prolixin for about a month.   Sleep is good and energy is adequate during the day. Appetite is good, has been working on diabetic diet.   Neighbor are not bothering him. Denied depression or anxiety. Denied irritability.  Still having stalkers that he saw when he went to [facility]. He feels that they are investigating him or stalking him for training for their trainees. He doesn't like this.   Denied worsening anxiety or restlessness with decreased klonopin dose.   Patient amenable to decrease klonopin after discussing the risks, benefits, and side effects. Otherwise patient had no other questions or concerns and was amenable to plan per above.  Safety: Denied active and passive SI, HI, AVH, paranoid or persecutory delusions. Denied thought broadcasting/insertion/withdrawal, mind control, idea of reference.    Review of Systems  Constitutional:  Positive for activity change (more activity). Negative for appetite change and fatigue.  Respiratory:  Negative for shortness of breath.   Cardiovascular:  Negative for chest pain.  Gastrointestinal:  Negative for abdominal pain, constipation, diarrhea and nausea.  Neurological:  Positive for tremors. Negative for dizziness and headaches.   Visit Diagnosis:    ICD-10-CM   1. Paranoid schizophrenia, chronic condition (HCC)  F20.0 amantadine (SYMMETREL) 100 MG capsule    Cariprazine HCl  (VRAYLAR) 6 MG CAPS    fluPHENAZine (PROLIXIN) 5 MG tablet    2. Recurrent major depressive disorder, in partial remission (HCC)  F33.41 sertraline (ZOLOFT) 100 MG tablet    3. Chronic prescription benzodiazepine use  Z79.899 clonazePAM (KLONOPIN) 0.5 MG tablet      Past Psychiatric History:  Diagnoses: Paranoid schizophrenia, MDD, schizoaffective d/o-depressed mood Medication trials:  Klonopin, gabapentin, lyrica amantadine Wellbutrin, zoloft, trazodone [name], latuda, fanapt, geodon, Prolixin, vraylar Previous psychiatrist/therapist: yes Hospitalizations: NA Suicide attempts: NA Hx of violence towards others: Denied Current access to guns: Denied  Substance Use History: EtOH:  reports no history of alcohol use. Nicotine:  reports that he has never smoked. He has never used smokeless tobacco. Marijuana: Denied IV drug use: Denied Stimulants: Denied Opiates: Denied Sedative/hypnotics: Denied Hallucinogens: Denied  Past Medical History: Dx:  has a past medical history of Allergy, Anxiety, Asthma, Chronic pain, Depression, Diabetes mellitus, Glaucoma, Neuromuscular disorder (HCC), and Paranoid schizophrenia (HCC).  Seizures: no, EEG x2 (2020, 2023) WNL Allergies: Cat hair extract, Citric acid, Food, and Gabapentin   Family Psychiatric History:  Mother- Depression and PTSD, on Abilify Paternal Aunt- Paranoia, hoarding  Social History:  Housing: Living with mom Income: disability for mental health  Past Medical History:  Past Medical History:  Diagnosis Date   Allergy    dog and cats, citrus   Anxiety    Asthma    Chronic  pain    Depression    Diabetes mellitus    Glaucoma    Neuromuscular disorder (HCC)    Paranoid schizophrenia [facility])     Past Surgical History:  Procedure Laterality Date   ANKLE ARTHROSCOPY     Arm Surgery  1/12   rt ulnar nerve decompression   EPIDURAL BLOCK INJECTION     several   ULNAR NERVE TRANSPOSITION  01/19/2012   Procedure:  ULNAR NERVE DECOMPRESSION/TRANSPOSITION;  Surgeon: Wyn Forster., MD;  Location: [facility];  Service: Orthopedics;  Laterality: Left;  ulnar nerve decompression vs transposition left elbow   Family History:  Family History  Problem Relation Age of Onset   Diabetes Father    Diabetes Paternal Uncle    Colon cancer Neg Hx    Colon polyps Neg Hx    Esophageal cancer Neg Hx    Rectal cancer Neg Hx    Stomach cancer Neg Hx    Social History   Socioeconomic History   Marital status: Single    Spouse name: Not on file   Number of children: 0   Years of education: BA   Highest education level: Not on file  Occupational History   Occupation: Unemlpoyed-disabled  Tobacco Use   Smoking status: Never   Smokeless tobacco: Never  Vaping Use   Vaping status: Never Used  Substance and Sexual Activity   Alcohol use: No   Drug use: No   Sexual activity: Yes  Other Topics Concern   Not on file  Social History Narrative   Lives with mother   Caffeine use: Coke very rare   Right handed    Social Determinants of Health   Financial Resource Strain: Low Risk  (10/03/2022)   Overall Financial Resource Strain (CARDIA)    Difficulty of Paying Living Expenses: Not hard at all  Food Insecurity: No Food Insecurity (10/03/2022)   Hunger Vital Sign    Worried About Running Out of Food in the Last Year: Never true    Ran Out of Food in the Last Year: Never true  Transportation Needs: No Transportation Needs (10/03/2022)   PRAPARE - Administrator, Civil Service (Medical): No    Lack of Transportation (Non-Medical): No  Physical Activity: Insufficiently Active (10/03/2022)   Exercise Vital Sign    Days of Exercise per Week: 4 days    Minutes of Exercise per Session: 30 min  Stress: Stress Concern Present (10/03/2022)   [employer] of Occupational Health - Occupational Stress Questionnaire    Feeling of Stress : To some extent  Social Connections: Socially  Isolated (09/21/2021)   Social Connection and Isolation Panel [NHANES]    Frequency of Communication with Friends and Family: Three times a week    Frequency of Social Gatherings with Friends and Family: Three times a week    Attends Religious Services: Never    Active Member of Clubs or Organizations: No    Attends Banker Meetings: Not on file    Marital Status: Never married    Allergies:  Allergies  Allergen Reactions   Cat Hair Extract    Citric Acid Other (See Comments)    Runny nose, eyes water   Food     Oranges or anything with citric acid   Gabapentin     Walking into things, hard to maintain balance, falls    Current Medications: Current Outpatient Medications  Medication Sig Dispense Refill   ACCU-CHEK SOFTCLIX LANCETS lancets  3 (three) times daily. for testing  0   albuterol (VENTOLIN HFA) 108 (90 Base) MCG/ACT inhaler Inhale 1-2 puffs into the lungs every 6 (six) hours as needed for wheezing or shortness of breath. 18 g 0   amantadine (SYMMETREL) 100 MG capsule Take 1 capsule (100 mg total) by mouth 2 (two) times daily. Take 2 capsules in the morning (200 mg) and one capsule (100 mg) in the evening 60 capsule 2   Blood Glucose Monitoring Suppl (ACCU-CHEK GUIDE ME) w/Device KIT USE AS DIRECTED 1 kit 0   Cariprazine HCl (VRAYLAR) 6 MG CAPS Take 1 capsule (6 mg total) by mouth daily. TAKE 1 CAPSULE(6 MG) BY MOUTH DAILY 30 capsule 2   chlorhexidine (HIBICLENS) 4 % external liquid Apply topically daily as needed. 118 mL 0   clonazePAM (KLONOPIN) 0.5 MG tablet Take 1 tablet (0.5 mg total) by mouth 2 (two) times daily. 60 tablet 0   fexofenadine (ALLEGRA ODT) 30 MG disintegrating tablet Take 30 mg by mouth daily.     fluPHENAZine (PROLIXIN) 5 MG tablet Take 1.5 tablets (7.5 mg total) by mouth at bedtime. 45 tablet 2   fluticasone (FLONASE) 50 MCG/ACT nasal spray SHAKE LIQUID AND USE 2 SPRAYS IN EACH NOSTRIL DAILY 16 g 6   glipiZIDE (GLUCOTROL) 10 MG tablet TAKE 1  TABLET(10 MG) BY MOUTH TWICE DAILY 180 tablet 3   glucose blood (ACCU-CHEK GUIDE) test strip 1 each by Other route in the morning, at noon, and at bedtime. Use to test glucose levels 3 times daily 200 strip 3   JARDIANCE 25 MG TABS tablet TAKE 1 TABLET BY MOUTH DAILY 90 tablet 2   metFORMIN (GLUCOPHAGE-XR) 500 MG 24 hr tablet TAKE 2 TABLETS(1000 MG) BY MOUTH TWICE DAILY 360 tablet 3   montelukast (SINGULAIR) 10 MG tablet TAKE 1 TABLET(10 MG) BY MOUTH AT BEDTIME 90 tablet 3   mupirocin ointment (BACTROBAN) 2 % Apply 1 Application topically daily. 22 g 0   omeprazole (PRILOSEC) 20 MG capsule Take 1 capsule (20 mg total) by mouth daily. 30 capsule 3   sertraline (ZOLOFT) 100 MG tablet Take 1.5 tablets (150 mg total) by mouth daily. 45 tablet 2   simvastatin (ZOCOR) 20 MG tablet TAKE 1 TABLET(20 MG) BY MOUTH EVERY EVENING 90 tablet 3   tadalafil (CIALIS) 20 MG tablet TAKE ONE-HALF TO ONE TABLET BY MOUTH ONE TIME EVERY OTHER DAY AS NEEDED FOR FOR ERECTILE DYSFUNCTION 10 tablet 2   traZODone (DESYREL) 50 MG tablet Take 1 tablet (50 mg total) by mouth at bedtime as needed for sleep. 30 tablet 0   TYRVAYA 0.03 MG/ACT SOLN Spray 1 spray in each nostril twice daily, approximately 12 hours apart     Current Facility-Administered Medications  Medication Dose Route Frequency Provider Last Rate Last Admin   methylPREDNISolone acetate (DEPO-MEDROL) injection 80 mg  80 mg Other Once Tyrell Antonio, MD        Objective: Psychiatric Specialty Exam: There were no vitals taken for this visit.There is no height or weight on file to calculate BMI.  General Appearance: Casual, faily groomed  Eye Contact:  Good    Speech:  Clear, coherent, normal rate   Volume:  Normal   Mood:  "not bad"  Affect:  Appropriate, congruent, blunted  Thought Content: Logical, rumination  Suicidal Thoughts: Denied active and passive SI    Thought Process:  Coherent, goal-directed, linear   Orientation:  A&Ox4   Memory:   Immediate good  Judgment:  Fair  Insight:  Shallow  Concentration:  Attention and concentration good  Recall:  immediate good, delayed poor   Fund of Knowledge: Good  Language: Good, fluent  Psychomotor Activity: Some intention tremors, none at rest  Akathisia:  NA   AIMS (if indicated): NA  Assets:  Communication Skills Desire for Improvement Housing Leisure Time Physical Health Resilience Social Support Talents/Skills Transportation Vocational/Educational  ADL's:  Intact  Cognition: WNL  Sleep:  Fair    PE: General: well-appearing; no acute distress  Pulm: no increased work of breathing on room air  Strength & Muscle Tone: within normal limits Neuro: no focal neurological deficits observed  Gait & Station: normal  Metabolic Disorder Labs: Lab Results  Component Value Date   HGBA1C 7.6 (H) 07/18/2023   MPG 123 03/02/2018   No results found for: "PROLACTIN" Lab Results  Component Value Date   CHOL 115 11/09/2022   TRIG 60.0 11/09/2022   HDL 52.50 11/09/2022   CHOLHDL 2 11/09/2022   VLDL 12.0 11/09/2022   LDLCALC 50 11/09/2022   LDLCALC 58 07/26/2022   Lab Results  Component Value Date   TSH 1.02 07/08/2019   TSH 0.92 03/15/2018    Therapeutic Level Labs: No results found for: "LITHIUM" No results found for: "VALPROATE" No results found for: "CBMZ"  Screenings: GAD-7    Flowsheet Row Office Visit from 07/26/2022 in [facility] [employer]  Total GAD-7 Score 5      PHQ2-9    Flowsheet Row Counselor from 03/08/2023 in [facility] [place] Counselor from 12/29/2022 in [facility] Visit from 11/17/2022 in [facility] [employer] Visit from 11/09/2022 in [facility] [employer] Clinical Support from 10/03/2022 in [facility] [employer]  PHQ-2 Total Score 0 0  0 0 0      Flowsheet Row ED from 03/31/2023 in [facility] ED from 03/19/2023 in [facility] from 03/08/2023 in [facility] RISK CATEGORY No Risk No Risk No Risk       Patient/Guardian was advised Release of Information must be obtained prior to any record release in order to collaborate their care with an outside provider. Patient/Guardian was advised if they have not already done so to contact the registration department to sign all necessary forms in order for [place] to release information regarding their care.   Consent: Patient/Guardian gives verbal consent for treatment and assignment of benefits for services provided during this visit. Patient/Guardian expressed understanding and agreed to proceed.   Princess Bruins, DO Psych Resident, PGY-3

## 2023-08-04 ENCOUNTER — Encounter: Payer: Medicare Other | Admitting: Family Medicine

## 2023-08-07 ENCOUNTER — Other Ambulatory Visit: Payer: Self-pay | Admitting: Family Medicine

## 2023-08-08 DIAGNOSIS — M71571 Other bursitis, not elsewhere classified, right ankle and foot: Secondary | ICD-10-CM | POA: Diagnosis not present

## 2023-08-31 ENCOUNTER — Encounter: Payer: Self-pay | Admitting: Family Medicine

## 2023-09-01 ENCOUNTER — Other Ambulatory Visit: Payer: Self-pay

## 2023-09-01 DIAGNOSIS — L600 Ingrowing nail: Secondary | ICD-10-CM | POA: Diagnosis not present

## 2023-09-01 MED ORDER — TADALAFIL 20 MG PO TABS: ORAL_TABLET | ORAL | 2 refills | Status: DC

## 2023-09-04 ENCOUNTER — Other Ambulatory Visit (HOSPITAL_COMMUNITY): Payer: Self-pay | Admitting: Student

## 2023-09-04 ENCOUNTER — Encounter (HOSPITAL_COMMUNITY): Payer: Self-pay | Admitting: Student

## 2023-09-04 DIAGNOSIS — F3341 Major depressive disorder, recurrent, in partial remission: Secondary | ICD-10-CM

## 2023-09-04 DIAGNOSIS — F200 Paranoid schizophrenia: Secondary | ICD-10-CM

## 2023-09-04 MED ORDER — SERTRALINE HCL 100 MG PO TABS
150.0000 mg | ORAL_TABLET | Freq: Every day | ORAL | 0 refills | Status: DC
Start: 2023-09-04 — End: 2023-09-14

## 2023-09-04 MED ORDER — FLUPHENAZINE HCL 5 MG PO TABS
7.5000 mg | ORAL_TABLET | Freq: Every day | ORAL | 0 refills | Status: DC
Start: 2023-09-04 — End: 2023-09-13

## 2023-09-04 MED ORDER — VRAYLAR 6 MG PO CAPS
6.0000 mg | ORAL_CAPSULE | Freq: Every day | ORAL | 0 refills | Status: DC
Start: 2023-09-04 — End: 2023-09-13

## 2023-09-05 ENCOUNTER — Encounter: Payer: Self-pay | Admitting: Family Medicine

## 2023-09-06 ENCOUNTER — Ambulatory Visit (HOSPITAL_COMMUNITY): Payer: Medicare Other | Admitting: Student

## 2023-09-06 ENCOUNTER — Other Ambulatory Visit: Payer: Self-pay | Admitting: Family Medicine

## 2023-09-06 DIAGNOSIS — E119 Type 2 diabetes mellitus without complications: Secondary | ICD-10-CM

## 2023-09-07 ENCOUNTER — Encounter: Payer: Self-pay | Admitting: Family Medicine

## 2023-09-07 ENCOUNTER — Ambulatory Visit (INDEPENDENT_AMBULATORY_CARE_PROVIDER_SITE_OTHER): Payer: Medicare Other | Admitting: Family Medicine

## 2023-09-07 VITALS — BP 102/82 | HR 80 | Temp 98.0°F | Resp 16 | Ht 70.0 in | Wt 192.0 lb

## 2023-09-07 DIAGNOSIS — E119 Type 2 diabetes mellitus without complications: Secondary | ICD-10-CM | POA: Diagnosis not present

## 2023-09-07 DIAGNOSIS — Z9189 Other specified personal risk factors, not elsewhere classified: Secondary | ICD-10-CM | POA: Diagnosis not present

## 2023-09-07 DIAGNOSIS — Z7984 Long term (current) use of oral hypoglycemic drugs: Secondary | ICD-10-CM

## 2023-09-07 DIAGNOSIS — N5201 Erectile dysfunction due to arterial insufficiency: Secondary | ICD-10-CM | POA: Diagnosis not present

## 2023-09-07 DIAGNOSIS — E7800 Pure hypercholesterolemia, unspecified: Secondary | ICD-10-CM | POA: Diagnosis not present

## 2023-09-07 MED ORDER — SILDENAFIL CITRATE 100 MG PO TABS
50.0000 mg | ORAL_TABLET | Freq: Every day | ORAL | 11 refills | Status: DC | PRN
Start: 2023-09-07 — End: 2023-11-08

## 2023-09-07 MED ORDER — EMPAGLIFLOZIN 25 MG PO TABS: 25.0000 mg | ORAL_TABLET | Freq: Every day | ORAL | 2 refills | Status: DC

## 2023-09-07 MED ORDER — SIMVASTATIN 20 MG PO TABS
ORAL_TABLET | ORAL | 3 refills | Status: AC
Start: 2023-09-07 — End: ?

## 2023-09-07 MED ORDER — GLIPIZIDE 10 MG PO TABS: ORAL_TABLET | ORAL | 3 refills | Status: DC

## 2023-09-07 MED ORDER — METFORMIN HCL ER 500 MG PO TB24
ORAL_TABLET | ORAL | 3 refills | Status: AC
Start: 2023-09-07 — End: ?

## 2023-09-07 NOTE — Progress Notes (Signed)
 Established Patient Office Visit   Subjective:  Patient ID: Glenn Robertson, male    DOB: August 03, 1966  Age: 58 y.o. MRN: [id number]  Chief Complaint  Patient presents with   Erectile Dysfunction    Will like to try viagra    Anxiety    Erectile Dysfunction  Anxiety     Encounter Diagnoses  Name Primary?   At risk for side effect of medication Yes   Type 2 diabetes mellitus without complication, without long-term current use of insulin (HCC)    Elevated cholesterol    Erectile dysfunction due to arterial insufficiency    For follow-up of above.  Psychiatry requested EKG.  Continues empagliflozin , glipizide  and metformin  for diabetes.  Has not been exercising as much because he cannot run.  Foot remains sore.  He has been seen podiatrist.   Review of Systems  Constitutional: Negative.   HENT: Negative.    Eyes:  Negative for blurred vision, discharge and redness.  Respiratory: Negative.    Cardiovascular: Negative.   Gastrointestinal:  Negative for abdominal pain.  Genitourinary: Negative.   Musculoskeletal: Negative.  Negative for myalgias.  Skin:  Negative for rash.  Neurological:  Negative for tingling, loss of consciousness and weakness.  Endo/Heme/Allergies:  Negative for polydipsia.     Current Outpatient Medications:    ACCU-CHEK SOFTCLIX LANCETS lancets, 3 (three) times daily. for testing, Disp: , Rfl: 0   albuterol  (VENTOLIN  HFA) 108 (90 Base) MCG/ACT inhaler, Inhale 1-2 puffs into the lungs every 6 (six) hours as needed for wheezing or shortness of breath., Disp: 18 g, Rfl: 0   amantadine  (SYMMETREL ) 100 MG capsule, Take 1 capsule (100 mg total) by mouth 2 (two) times daily. Take 2 capsules in the morning (200 mg) and one capsule (100 mg) in the evening, Disp: 60 capsule, Rfl: 2   Blood Glucose Monitoring Suppl (ACCU-CHEK GUIDE ME) w/Device KIT, USE AS DIRECTED, Disp: 1 kit, Rfl: 0   Cariprazine  HCl (VRAYLAR ) 6 MG CAPS, Take 1 capsule (6 mg total) by mouth  daily. TAKE 1 CAPSULE(6 MG) BY MOUTH DAILY, Disp: 30 capsule, Rfl: 0   chlorhexidine  (HIBICLENS ) 4 % external liquid, Apply topically daily as needed., Disp: 118 mL, Rfl: 0   fexofenadine (ALLEGRA ODT) 30 MG disintegrating tablet, Take 30 mg by mouth daily., Disp: , Rfl:    fluPHENAZine  (PROLIXIN ) 5 MG tablet, Take 1.5 tablets (7.5 mg total) by mouth at bedtime., Disp: 45 tablet, Rfl: 0   fluticasone  (FLONASE ) 50 MCG/ACT nasal spray, SHAKE LIQUID AND USE 2 SPRAYS IN EACH NOSTRIL DAILY, Disp: 16 g, Rfl: 6   glucose blood (ACCU-CHEK GUIDE) test strip, 1 each by Other route in the morning, at noon, and at bedtime. Use to test glucose levels 3 times daily, Disp: 200 strip, Rfl: 3   montelukast  (SINGULAIR ) 10 MG tablet, TAKE 1 TABLET(10 MG) BY MOUTH AT BEDTIME, Disp: 90 tablet, Rfl: 2   mupirocin  ointment (BACTROBAN ) 2 %, Apply 1 Application topically daily., Disp: 22 g, Rfl: 0   omeprazole  (PRILOSEC) 20 MG capsule, TAKE 1 CAPSULE(20 MG) BY MOUTH DAILY, Disp: 30 capsule, Rfl: 3   sertraline  (ZOLOFT ) 100 MG tablet, Take 1.5 tablets (150 mg total) by mouth daily., Disp: 45 tablet, Rfl: 0   sildenafil  (VIAGRA ) 100 MG tablet, Take 0.5-1 tablets (50-100 mg total) by mouth daily as needed for erectile dysfunction., Disp: 5 tablet, Rfl: 11   traZODone  (DESYREL ) 50 MG tablet, Take 1 tablet (50 mg total) by mouth at bedtime as  needed for sleep., Disp: 30 tablet, Rfl: 0   TYRVAYA 0.03 MG/ACT SOLN, Spray 1 spray in each nostril twice daily, approximately 12 hours apart, Disp: , Rfl:    clonazePAM  (KLONOPIN ) 0.5 MG tablet, Take 1 tablet (0.5 mg total) by mouth 2 (two) times daily., Disp: 60 tablet, Rfl: 0   empagliflozin  (JARDIANCE ) 25 MG TABS tablet, Take 1 tablet (25 mg total) by mouth daily., Disp: 90 tablet, Rfl: 2   glipiZIDE  (GLUCOTROL ) 10 MG tablet, TAKE 1 TABLET(10 MG) BY MOUTH TWICE DAILY, Disp: 360 tablet, Rfl: 3   metFORMIN  (GLUCOPHAGE -XR) 500 MG 24 hr tablet, TAKE 2 TABLETS(1000 MG) BY MOUTH TWICE DAILY,  Disp: 360 tablet, Rfl: 3   simvastatin  (ZOCOR ) 20 MG tablet, TAKE 1 TABLET(20 MG) BY MOUTH EVERY EVENING, Disp: 90 tablet, Rfl: 3  Current Facility-Administered Medications:    methylPREDNISolone  acetate (DEPO-MEDROL ) injection 80 mg, 80 mg, Other, Once, Eldonna Novel, MD   Objective:     BP 102/82 (BP Location: Right Arm, Patient Position: Sitting, Cuff Size: Normal)   Pulse 80   Temp 98 F (36.7 C) (Temporal)   Resp 16   Ht 5' 10 (1.778 m)   Wt 192 lb (87.1 kg)   SpO2 98%   BMI 27.55 kg/m    Physical Exam Constitutional:      General: He is not in acute distress.    Appearance: Normal appearance. He is not ill-appearing, toxic-appearing or diaphoretic.  HENT:     Head: Normocephalic and atraumatic.     Right Ear: External ear normal.     Left Ear: External ear normal.  Eyes:     General: No scleral icterus.       Right eye: No discharge.        Left eye: No discharge.     Extraocular Movements: Extraocular movements intact.     Conjunctiva/sclera: Conjunctivae normal.  Cardiovascular:     Rate and Rhythm: Normal rate and regular rhythm.  Pulmonary:     Effort: Pulmonary effort is normal. No respiratory distress.     Breath sounds: Normal breath sounds. No wheezing or rales.  Musculoskeletal:     Cervical back: No rigidity or tenderness.  Skin:    General: Skin is warm and dry.  Neurological:     Mental Status: He is alert and oriented to person, place, and time.  Psychiatric:        Mood and Affect: Mood normal.        Behavior: Behavior normal.      No results found for any visits on 09/07/23.    The ASCVD Risk score (Arnett DK, et al., 2019) failed to calculate for the following reasons:   The valid total cholesterol range is 130 to 320 mg/dL    Assessment & Plan:   At risk for side effect of medication -     EKG 12-Lead  Type 2 diabetes mellitus without complication, without long-term current use of insulin (HCC) -     glipiZIDE ; TAKE 1  TABLET(10 MG) BY MOUTH TWICE DAILY  Dispense: 360 tablet; Refill: 3 -     Empagliflozin ; Take 1 tablet (25 mg total) by mouth daily.  Dispense: 90 tablet; Refill: 2 -     metFORMIN  HCl ER; TAKE 2 TABLETS(1000 MG) BY MOUTH TWICE DAILY  Dispense: 360 tablet; Refill: 3  Elevated cholesterol -     Simvastatin ; TAKE 1 TABLET(20 MG) BY MOUTH EVERY EVENING  Dispense: 90 tablet; Refill: 3  Erectile dysfunction due  to arterial insufficiency -     Sildenafil  Citrate; Take 0.5-1 tablets (50-100 mg total) by mouth daily as needed for erectile dysfunction.  Dispense: 5 tablet; Refill: 11    Return Should have follow-up appointment on 5/19, for chronic disease follow-up.  Will try Viagra  as needed.  Advised not to take Viagra  with Cialis .  Knows that any erection lasting over 4 hours needs emergency evaluation.  Please increase exercise.  Walking alone would be great.  With ongoing foot issues, advised to discontinue trying to run.  EKG showed normal sinus rhythm with a rate of 76.  No ST segment elevations or T wave inversions.  QT interval was 394  Elsie Sim Lent, MD

## 2023-09-13 ENCOUNTER — Ambulatory Visit (HOSPITAL_BASED_OUTPATIENT_CLINIC_OR_DEPARTMENT_OTHER): Payer: Medicare Other | Admitting: Student

## 2023-09-13 DIAGNOSIS — F200 Paranoid schizophrenia: Secondary | ICD-10-CM | POA: Diagnosis not present

## 2023-09-13 DIAGNOSIS — F3341 Major depressive disorder, recurrent, in partial remission: Secondary | ICD-10-CM

## 2023-09-13 DIAGNOSIS — Z79899 Other long term (current) drug therapy: Secondary | ICD-10-CM | POA: Diagnosis not present

## 2023-09-13 MED ORDER — FLUPHENAZINE HCL 5 MG PO TABS
7.5000 mg | ORAL_TABLET | Freq: Every day | ORAL | 1 refills | Status: DC
Start: 2023-09-13 — End: 2023-10-30

## 2023-09-13 MED ORDER — CLONAZEPAM 1 MG PO TABS
1.0000 mg | ORAL_TABLET | Freq: Every evening | ORAL | 0 refills | Status: DC
Start: 2023-10-02 — End: 2023-11-08

## 2023-09-13 MED ORDER — VRAYLAR 6 MG PO CAPS
6.0000 mg | ORAL_CAPSULE | Freq: Every day | ORAL | 1 refills | Status: DC
Start: 2023-09-13 — End: 2023-10-30

## 2023-09-13 NOTE — Progress Notes (Signed)
BH MD Outpatient Progress Note  09/13/2023 4:36 PM Glenn Robertson  MRN: [id number]  Assessment:  Glenn Robertson presents for follow-up evaluation in-person.   Identifying Information: Glenn Robertson is a 58 y.o. male with a history of paranoid schizophrenia, MDD, no suicide attempt or inpatient psych admission, who is an established patient with [facility] for management of delusions and depressed mood.   Risk Assessment: An assessment of suicide and violence risk factors was performed as part of this evaluation and is not significantly changed from the last visit.             While future psychiatric events cannot be accurately predicted, the patient does not currently require acute inpatient psychiatric care and does not currently meet [facility] involuntary commitment criteria.          Plan:  # Paranoid schizophrenia vs delusional d/o (previously schizoaffective d/o with depressed mood)  Dual antipsychotics # Chronic benzodiazepine use Past medication trials:  Status of problem:  Reported persecutory vs paranoid delusions that he reported is unchanged initially. On further conversation, patient reported that he has noticed his stalkers multiple times at the grocery store. The stalker he is talking about has moved away, he does not know where they are. Per previous f/u note (see Dr. Camila Li note 02/02/2023), patient reported no paranoia (seeing stalkers). However during 04/12/2023 encounter, he endorsed seeing stalkers per above, last week, but on further chart review, patient has been a poor historian.  Questioning dx of paranoid schizophrenia (previously schizoaffective d/o) vs delusional d/o, given only sxs of delusions. However per chart review, patient had flat affect, grandiose delusions, bizarre perceptual disturbances which is more consistent with scz/scza rather delusional d/o. Will continue assess to better clarify dx.  Unclear reason for patient's  klonopin, suspect for anxiety related paranoia. He denied anxiety outside of the delusion. For this reason, will work on tapering patient off klonopin slowly.  AIMS 0 (09/14/2023) No longer paranoid after consistently taking his antipsychotics. Currently euthymic, appeared calm and jovial during encounter  Interventions: Antipsychotic labs - 06/2023 EKG - Qtc 420 - 09/07/2023 Continued home vraylar 6 mg daily Continued home prolixin 7.5 mg qHS DECREASED home klonopin 0.5 mg qAM + 1 mg qPM to 1 mg qHS  #EPS - dystonia  Action Tremors Past medication trials:  Status of problem:  AIMS 0 (04/12/2023), no dystonia on physical exam (04/12/2023). Does have action tremor b/l, none at rest. Suspect multifactorial 2/2 meds, DM2, essential tremor, possible neurocognitive d/o given issues with memory (2/3 delayed recall). Otherwise was A&Ox4, DOWB without difficulties. Will continue to assess.  Interventions:  Continued home amantadine 100 mg BID  # MDD Past medication trials:  Status of problem:  Depressed mood is much improved, evident by increased motivation to resume his old hobbies. He rarely uses trazodone.  Interventions: Continued home zoloft 150 mg daily Continued home trazodone 50 mg at bedtime PRN  Health Maintenance PCP: Mliss Sax, MD  DM2 - metformin 1000 mg BID, jardiance 25 mg, glipizide 10 mg BID Exercised induced asthma - singulair 10 mg daily, allegra daily, tyrvaya nasal spray BID HLD - simvastatin 20 mg daily ED - cialis 20 mg PRN  Return to care in: Future Appointments  Date Time Provider Department Center  10/05/2023  8:10 AM [facility]-ANNUAL WELLNESS VISIT [facility]  10/18/2023 10:00 AM Princess Bruins, DO [facility] None  11/14/2023 10:15 AM Terri Piedra, DO [facility] None   Patient was given  contact information for behavioral health clinic and was instructed to call 911 for emergencies.   Patient and plan of care will be discussed with the Attending MD, Dr.  Mercy Riding, who agrees with the above statement and plan.   Subjective:  Chief Complaint:  Chief Complaint  Patient presents with   Schizophrenia   Interval History:   PDMP 08/31/2023 klonopin 1 mg qty 60 for 40 days  Unaccompanied.  Denied seeing stalkers or feeling like people are talking about him for the past ~month. Last time he had paranoid thoughts that someone was watching him was a little over a month ago.   Mood is good, but bored because of the cold. He can't go for a run  Sleep is unchanged, good  Appetite is unchanged, good  Patient amenable to decrease klonopin after discussing the risks, benefits, and side effects. Otherwise patient had no other questions or concerns and was amenable to plan per above.  Safety: Denied active and passive SI, HI, AVH. Denied thought broadcasting/insertion/withdrawal, mind control, idea of reference, paranoia and delusions   Review of Systems  Constitutional:  Positive for activity change (more activity). Negative for appetite change and fatigue.  Respiratory:  Negative for shortness of breath.   Cardiovascular:  Negative for chest pain.  Gastrointestinal:  Negative for abdominal pain, constipation, diarrhea and nausea.  Neurological:  Positive for tremors. Negative for dizziness and headaches.   Visit Diagnosis:    ICD-10-CM   1. Long term current use of antipsychotic medication  Z79.899     2. Chronic prescription benzodiazepine use  Z79.899 clonazePAM (KLONOPIN) 1 MG tablet    3. Paranoid schizophrenia, chronic condition (HCC)  F20.0 Cariprazine HCl (VRAYLAR) 6 MG CAPS    fluPHENAZine (PROLIXIN) 5 MG tablet    4. Recurrent major depressive disorder, in partial remission (HCC)  F33.41 sertraline (ZOLOFT) 100 MG tablet      Past Psychiatric History:  Diagnoses: Paranoid schizophrenia, MDD, schizoaffective d/o-depressed mood Medication trials:  Klonopin, gabapentin, lyrica amantadine Wellbutrin, zoloft, trazodone [name],  latuda, fanapt, geodon, Prolixin, vraylar Previous psychiatrist/therapist: yes Hospitalizations: NA Suicide attempts: NA Hx of violence towards others: Denied Current access to guns: Denied  Substance Use History: EtOH:  reports no history of alcohol use. Nicotine:  reports that he has never smoked. He has never used smokeless tobacco. Marijuana: Denied IV drug use: Denied Stimulants: Denied Opiates: Denied Sedative/hypnotics: Denied Hallucinogens: Denied  Past Medical History: Dx:  has a past medical history of Allergy, Anxiety, Asthma, Chronic pain, Depression, Diabetes mellitus, Glaucoma, Neuromuscular disorder (HCC), and Paranoid schizophrenia (HCC).  Seizures: no, EEG x2 (2020, 2023) WNL Allergies: Cat dander, Citric acid, Food, and Gabapentin   Family Psychiatric History:  Mother- Depression and PTSD, on Abilify Paternal Aunt- Paranoia, hoarding  Social History:  Housing: Living with mom Income: disability for mental health  Past Medical History:  Past Medical History:  Diagnosis Date   Allergy    dog and cats, citrus   Anxiety    Asthma    Chronic pain    Depression    Diabetes mellitus    Glaucoma    Neuromuscular disorder (HCC)    Paranoid schizophrenia (HCC)     Past Surgical History:  Procedure Laterality Date   ANKLE ARTHROSCOPY     Arm Surgery  1/12   rt ulnar nerve decompression   EPIDURAL BLOCK INJECTION     several   ULNAR NERVE TRANSPOSITION  01/19/2012   Procedure: ULNAR NERVE DECOMPRESSION/TRANSPOSITION;  Surgeon: Katy Fitch  Naaman Plummer., MD;  Location: [facility];  Service: Orthopedics;  Laterality: Left;  ulnar nerve decompression vs transposition left elbow   Family History:  Family History  Problem Relation Age of Onset   Diabetes Father    Diabetes Paternal Uncle    Colon cancer Neg Hx    Colon polyps Neg Hx    Esophageal cancer Neg Hx    Rectal cancer Neg Hx    Stomach cancer Neg Hx    Social History   Socioeconomic  History   Marital status: Single    Spouse name: Not on file   Number of children: 0   Years of education: BA   Highest education level: Not on file  Occupational History   Occupation: Unemlpoyed-disabled  Tobacco Use   Smoking status: Never   Smokeless tobacco: Never  Vaping Use   Vaping status: Never Used  Substance and Sexual Activity   Alcohol use: No   Drug use: No   Sexual activity: Yes  Other Topics Concern   Not on file  Social History Narrative   Lives with mother   Caffeine use: Coke very rare   Right handed    Social Drivers of Health   Financial Resource Strain: Low Risk  (10/03/2022)   Overall Financial Resource Strain (CARDIA)    Difficulty of Paying Living Expenses: Not hard at all  Food Insecurity: No Food Insecurity (10/03/2022)   Hunger Vital Sign    Worried About Running Out of Food in the Last Year: Never true    Ran Out of Food in the Last Year: Never true  Transportation Needs: No Transportation Needs (10/03/2022)   PRAPARE - Administrator, Civil Service (Medical): No    Lack of Transportation (Non-Medical): No  Physical Activity: Insufficiently Active (10/03/2022)   Exercise Vital Sign    Days of Exercise per Week: 4 days    Minutes of Exercise per Session: 30 min  Stress: Stress Concern Present (10/03/2022)   [employer] of Occupational Health - Occupational Stress Questionnaire    Feeling of Stress : To some extent  Social Connections: Socially Isolated (09/21/2021)   Social Connection and Isolation Panel [NHANES]    Frequency of Communication with Friends and Family: Three times a week    Frequency of Social Gatherings with Friends and Family: Three times a week    Attends Religious Services: Never    Active Member of Clubs or Organizations: No    Attends Banker Meetings: Not on file    Marital Status: Never married    Allergies:  Allergies  Allergen Reactions   Cat Dander    Citric Acid Other (See Comments)     Runny nose, eyes water   Food     Oranges or anything with citric acid   Gabapentin     Walking into things, hard to maintain balance, falls    Current Medications: Current Outpatient Medications  Medication Sig Dispense Refill   ACCU-CHEK SOFTCLIX LANCETS lancets 3 (three) times daily. for testing  0   albuterol (VENTOLIN HFA) 108 (90 Base) MCG/ACT inhaler Inhale 1-2 puffs into the lungs every 6 (six) hours as needed for wheezing or shortness of breath. 18 g 0   amantadine (SYMMETREL) 100 MG capsule Take 1 capsule (100 mg total) by mouth 2 (two) times daily. Take 2 capsules in the morning (200 mg) and one capsule (100 mg) in the evening 60 capsule 2   Blood Glucose Monitoring Suppl (  ACCU-CHEK GUIDE ME) w/Device KIT USE AS DIRECTED 1 kit 0   Cariprazine HCl (VRAYLAR) 6 MG CAPS Take 1 capsule (6 mg total) by mouth daily. TAKE 1 CAPSULE(6 MG) BY MOUTH DAILY 30 capsule 1   chlorhexidine (HIBICLENS) 4 % external liquid Apply topically daily as needed. 118 mL 0   [START ON 10/02/2023] clonazePAM (KLONOPIN) 1 MG tablet Take 1 tablet (1 mg total) by mouth at bedtime. 30 tablet 0   empagliflozin (JARDIANCE) 25 MG TABS tablet Take 1 tablet (25 mg total) by mouth daily. 90 tablet 2   fexofenadine (ALLEGRA ODT) 30 MG disintegrating tablet Take 30 mg by mouth daily.     fluPHENAZine (PROLIXIN) 5 MG tablet Take 1.5 tablets (7.5 mg total) by mouth at bedtime. 45 tablet 1   fluticasone (FLONASE) 50 MCG/ACT nasal spray SHAKE LIQUID AND USE 2 SPRAYS IN EACH NOSTRIL DAILY 16 g 6   glipiZIDE (GLUCOTROL) 10 MG tablet TAKE 1 TABLET(10 MG) BY MOUTH TWICE DAILY 360 tablet 3   glucose blood (ACCU-CHEK GUIDE) test strip 1 each by Other route in the morning, at noon, and at bedtime. Use to test glucose levels 3 times daily 200 strip 3   metFORMIN (GLUCOPHAGE-XR) 500 MG 24 hr tablet TAKE 2 TABLETS(1000 MG) BY MOUTH TWICE DAILY 360 tablet 3   montelukast (SINGULAIR) 10 MG tablet TAKE 1 TABLET(10 MG) BY MOUTH AT BEDTIME 90  tablet 2   mupirocin ointment (BACTROBAN) 2 % Apply 1 Application topically daily. 22 g 0   omeprazole (PRILOSEC) 20 MG capsule TAKE 1 CAPSULE(20 MG) BY MOUTH DAILY 30 capsule 3   sertraline (ZOLOFT) 100 MG tablet Take 1.5 tablets (150 mg total) by mouth daily. 45 tablet 1   sildenafil (VIAGRA) 100 MG tablet Take 0.5-1 tablets (50-100 mg total) by mouth daily as needed for erectile dysfunction. 5 tablet 11   simvastatin (ZOCOR) 20 MG tablet TAKE 1 TABLET(20 MG) BY MOUTH EVERY EVENING 90 tablet 3   traZODone (DESYREL) 50 MG tablet Take 1 tablet (50 mg total) by mouth at bedtime as needed for sleep. 30 tablet 0   TYRVAYA 0.03 MG/ACT SOLN Spray 1 spray in each nostril twice daily, approximately 12 hours apart     Current Facility-Administered Medications  Medication Dose Route Frequency Provider Last Rate Last Admin   methylPREDNISolone acetate (DEPO-MEDROL) injection 80 mg  80 mg Other Once Tyrell Antonio, MD        Objective: Psychiatric Specialty Exam: There were no vitals taken for this visit.There is no height or weight on file to calculate BMI.  General Appearance: Casual, faily groomed  Eye Contact:  Good    Speech:  Clear, coherent, normal rate   Volume:  Normal   Mood:  see above  Affect:  Appropriate, congruent, blunted  Thought Content: Logical, rumination  Suicidal Thoughts: Denied active and passive SI    Thought Process:  Coherent, goal-directed, linear   Orientation:  A&Ox4   Memory:  Immediate good  Judgment:  Good   Insight:  Shallow  Concentration:  Attention and concentration fair  Recall:  immediate good, delayed poor   Fund of Knowledge: Good  Language: Good, fluent  Psychomotor Activity: Some intention tremors, possible some at rest, but inconsistent  Akathisia:  see above  AIMS (if indicated): see above  Assets:  Communication Skills Desire for Improvement Housing Leisure Time Physical Health Resilience Social  Support Talents/Skills Transportation Vocational/Educational  ADL's:  Intact  Cognition: WNL  Sleep:  see above  PE: General: well-appearing; no acute distress  Pulm: no increased work of breathing on room air  Strength & Muscle Tone: within normal limits Neuro: no focal neurological deficits observed  Gait & Station: normal  Metabolic Disorder Labs: Lab Results  Component Value Date   HGBA1C 7.6 (H) 07/18/2023   MPG 123 03/02/2018   No results found for: "PROLACTIN" Lab Results  Component Value Date   CHOL 115 11/09/2022   TRIG 60.0 11/09/2022   HDL 52.50 11/09/2022   CHOLHDL 2 11/09/2022   VLDL 12.0 11/09/2022   LDLCALC 50 11/09/2022   LDLCALC 58 07/26/2022   Lab Results  Component Value Date   TSH 1.02 07/08/2019   TSH 0.92 03/15/2018    Therapeutic Level Labs: No results found for: "LITHIUM" No results found for: "VALPROATE" No results found for: "CBMZ"  Screenings: GAD-7    Flowsheet Row [facility] [employer]  Total GAD-7 Score 5      PHQ2-9    Flowsheet Row Counselor from 03/08/2023 in [facility] [place] Counselor from 12/29/2022 in [facility] Visit from 11/17/2022 in [facility] Visit from 11/09/2022 in [facility] Clinical Support from 10/03/2022 in [facility] [employer]  PHQ-2 Total Score 0 0 0 0 0      Flowsheet Row ED from 03/31/2023 in [facility] ED from 03/19/2023 in [facility] from 03/08/2023 in [facility] RISK CATEGORY No Risk No Risk No Risk       Patient/Guardian was advised Release of Information must be obtained prior to any record release in order to collaborate their  care with an outside provider. Patient/Guardian was advised if they have not already done so to contact the registration department to sign all necessary forms in order for [place] to release information regarding their care.   Consent: Patient/Guardian gives verbal consent for treatment and assignment of benefits for services provided during this visit. Patient/Guardian expressed understanding and agreed to proceed.   Princess Bruins, DO Psych Resident, PGY-3

## 2023-09-14 ENCOUNTER — Encounter (HOSPITAL_COMMUNITY): Payer: Self-pay | Admitting: Student

## 2023-09-14 MED ORDER — SERTRALINE HCL 100 MG PO TABS
150.0000 mg | ORAL_TABLET | Freq: Every day | ORAL | 1 refills | Status: DC
Start: 2023-09-14 — End: 2023-10-30

## 2023-09-15 NOTE — Addendum Note (Signed)
Addended by: Everlena Cooper on: 09/15/2023 06:35 PM   Modules accepted: Level of Service

## 2023-10-02 ENCOUNTER — Other Ambulatory Visit (HOSPITAL_COMMUNITY): Payer: Self-pay

## 2023-10-02 ENCOUNTER — Telehealth (HOSPITAL_COMMUNITY): Payer: Self-pay

## 2023-10-02 DIAGNOSIS — F200 Paranoid schizophrenia: Secondary | ICD-10-CM

## 2023-10-02 MED ORDER — AMANTADINE HCL 100 MG PO CAPS: 100.0000 mg | ORAL_CAPSULE | Freq: Two times a day (BID) | ORAL | 0 refills | Status: DC

## 2023-10-02 NOTE — Telephone Encounter (Signed)
Patient is calling for a refill on his amantadine. Please clarify his dose, patient says he takes one in the morning and one at bedtime, that is not what the medication module says. Please review and advise, thank you

## 2023-10-03 MED ORDER — AMANTADINE HCL 100 MG PO CAPS
100.0000 mg | ORAL_CAPSULE | Freq: Two times a day (BID) | ORAL | 0 refills | Status: DC
Start: 2023-10-03 — End: 2023-10-30

## 2023-10-13 ENCOUNTER — Telehealth: Payer: Self-pay | Admitting: *Deleted

## 2023-10-13 NOTE — Patient Outreach (Signed)
  Care Coordination   Follow Up Visit Note   10/13/2023 Name: Glenn Robertson MRN: [id number] DOB: 07/05/1966  Glenn Robertson is a 58 y.o. year old male who sees Mliss Sax, MD for primary care.    Goals Addressed             This Visit's Progress    Receive DM education mailers every quarter   On track    Interventions Today    Flowsheet Row Most Recent Value  Chronic Disease   Chronic disease during today's visit Diabetes  General Interventions   General Interventions Discussed/Reviewed General Interventions Reviewed  Education Interventions   Education Provided Provided Web-based Education, Provided Education  Provided Verbal Education On Sick Day Rules, Nutrition  [Education mailed]              SDOH assessments and interventions completed:  No     Care Coordination Interventions:  Yes, provided   Follow up plan:  Will send DM education next quarter    Encounter Outcome:  Patient Visit Completed   Rodney Langton, RN, MSN, CCM [place]  [facility], [facility] RN Care Coordinator Direct Dial: [phone] / Main [phone] Fax [phone] Email: [email or web address]: [email or web address]

## 2023-10-13 NOTE — Patient Instructions (Signed)
Visit Information  Thank you for taking time to visit with me today. Please don't hesitate to contact me if I can be of assistance to you before our next scheduled telephone appointment.  Please call the Suicide and Crisis Lifeline: 988 call the [place] National Suicide Prevention Lifeline: 201-150-8148 or TTY: 773-048-1854 TTY (302)064-6452) to talk to a trained counselor call 1-800-273-TALK (toll free, 24 hour hotline) call 911 if you are experiencing a Mental Health or Behavioral Health Crisis or need someone to talk to.  Patient verbalizes understanding of instructions and care plan provided today and agrees to view in MyChart. Active MyChart status and patient understanding of how to access instructions and care plan via MyChart confirmed with patient.     The patient has been provided with contact information for the care management team and has been advised to call with any health related questions or concerns.   Rodney Langton, RN, MSN, CCM [facility], [facility] RN Care Coordinator Direct Dial: [phone] / Main [phone] Fax [phone] Email: [email or web address]: [email or web address]

## 2023-10-18 ENCOUNTER — Ambulatory Visit (HOSPITAL_COMMUNITY): Payer: Medicare Other | Admitting: Student

## 2023-10-25 ENCOUNTER — Ambulatory Visit (HOSPITAL_COMMUNITY): Payer: Medicare Other | Admitting: Student

## 2023-10-30 ENCOUNTER — Telehealth (HOSPITAL_COMMUNITY): Payer: Self-pay

## 2023-10-30 DIAGNOSIS — F200 Paranoid schizophrenia: Secondary | ICD-10-CM

## 2023-10-30 DIAGNOSIS — F3341 Major depressive disorder, recurrent, in partial remission: Secondary | ICD-10-CM

## 2023-10-30 MED ORDER — AMANTADINE HCL 100 MG PO CAPS
100.0000 mg | ORAL_CAPSULE | Freq: Two times a day (BID) | ORAL | 2 refills | Status: DC
Start: 2023-10-30 — End: 2023-11-08

## 2023-10-30 MED ORDER — FLUPHENAZINE HCL 5 MG PO TABS: 7.5000 mg | ORAL_TABLET | Freq: Every day | ORAL | 2 refills | Status: DC

## 2023-10-30 MED ORDER — VRAYLAR 6 MG PO CAPS: 6.0000 mg | ORAL_CAPSULE | Freq: Every day | ORAL | 2 refills | Status: DC

## 2023-10-30 MED ORDER — SERTRALINE HCL 100 MG PO TABS: 150.0000 mg | ORAL_TABLET | Freq: Every day | ORAL | 2 refills | Status: DC

## 2023-10-30 NOTE — Telephone Encounter (Signed)
 Fax received from patients pharmacy for a refill on Amantadine 100 Unknown 1 capsule po BID. Patient has a follow up scheduled in March. Please review and advise, thank you

## 2023-10-30 NOTE — Telephone Encounter (Signed)
Refilled rx per request

## 2023-11-08 ENCOUNTER — Ambulatory Visit (HOSPITAL_BASED_OUTPATIENT_CLINIC_OR_DEPARTMENT_OTHER): Payer: Medicare Other | Admitting: Student

## 2023-11-08 ENCOUNTER — Encounter (HOSPITAL_COMMUNITY): Payer: Self-pay | Admitting: Student

## 2023-11-08 DIAGNOSIS — F3341 Major depressive disorder, recurrent, in partial remission: Secondary | ICD-10-CM

## 2023-11-08 DIAGNOSIS — Z79899 Other long term (current) drug therapy: Secondary | ICD-10-CM | POA: Diagnosis not present

## 2023-11-08 DIAGNOSIS — G4700 Insomnia, unspecified: Secondary | ICD-10-CM

## 2023-11-08 DIAGNOSIS — F200 Paranoid schizophrenia: Secondary | ICD-10-CM | POA: Diagnosis not present

## 2023-11-08 MED ORDER — SERTRALINE HCL 100 MG PO TABS: 150.0000 mg | ORAL_TABLET | Freq: Every morning | ORAL | 2 refills | Status: DC

## 2023-11-08 MED ORDER — AMANTADINE HCL 100 MG PO CAPS: 100.0000 mg | ORAL_CAPSULE | Freq: Two times a day (BID) | ORAL | 2 refills | Status: DC

## 2023-11-08 MED ORDER — CLONAZEPAM 0.5 MG PO TABS: 0.5000 mg | ORAL_TABLET | Freq: Every day | ORAL | 0 refills | Status: DC

## 2023-11-08 MED ORDER — TRAZODONE HCL 50 MG PO TABS: 50.0000 mg | ORAL_TABLET | Freq: Every evening | ORAL | 2 refills | Status: DC | PRN

## 2023-11-08 MED ORDER — VRAYLAR 6 MG PO CAPS: 6.0000 mg | ORAL_CAPSULE | Freq: Every day | ORAL | 2 refills | Status: DC

## 2023-11-08 MED ORDER — FLUPHENAZINE HCL 5 MG PO TABS: 7.5000 mg | ORAL_TABLET | Freq: Every day | ORAL | 2 refills | Status: DC
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
## 2023-11-09 ENCOUNTER — Encounter: Payer: Self-pay | Admitting: Family Medicine

## 2023-11-14 ENCOUNTER — Ambulatory Visit (INDEPENDENT_AMBULATORY_CARE_PROVIDER_SITE_OTHER): Payer: Medicare Other | Admitting: Dermatology

## 2023-11-14 VITALS — BP 110/68

## 2023-11-14 DIAGNOSIS — L810 Postinflammatory hyperpigmentation: Secondary | ICD-10-CM | POA: Diagnosis not present

## 2023-11-14 DIAGNOSIS — L219 Seborrheic dermatitis, unspecified: Secondary | ICD-10-CM | POA: Diagnosis not present

## 2023-11-14 MED ORDER — FLUOCINOLONE ACETONIDE BODY 0.01 % EX OIL: 1.0000 | TOPICAL_OIL | CUTANEOUS | 2 refills | Status: AC

## 2023-11-14 NOTE — Patient Instructions (Signed)
 Hello I[place],  Thank you for visiting my office today. I appreciate your dedication to improving your health and addressing your dermatological concerns. Here is a summary of the key instructions from today's consultation:  - CeraVe with salicylic acid: Use daily on the dark spot on your leg to help with exfoliation and hydration.  - Aquaphor: Apply on top of the salicylic acid lotion to lock in moisture and prevent dryness.  - Fluocinonide oil for ears:   - Usage: Daily for two weeks   - Maintenance: Once a week after initial period   - Application: Use Q-tip inside the crevices of the ear  - CeraVe products:   - Use shampoo and conditioner samples for scalp and ears   - Contains zinc for seborrheic dermatitis   - Available at [employer] or [employer] if found effective  - Follow-up: Schedule return visit in six to eight months to monitor progress.  Please ensure to follow these instructions as discussed to manage your conditions effectively. If you have any questions or concerns, feel free to contact our office.  Warm regards,  Dr. [facility]    Important Information  Due to recent changes in healthcare laws, you may see results of your pathology and/or laboratory studies on MyChart before the doctors have had a chance to review them. We understand that in some cases there may be results that are confusing or concerning to you. Please understand that not all results are received at the same time and often the doctors may need to interpret multiple results in order to provide you with the best plan of care or course of treatment. Therefore, we ask that you please give [place] 2 business days to thoroughly review all your results before contacting the office for clarification. Should we see a critical lab result, you will be contacted sooner.   If You Need Anything After Your Visit  If you have any questions or concerns for your doctor, please call our main line at  [phone] If no one answers, please leave a voicemail as directed and we will return your call as soon as possible. Messages left after 4 pm will be answered the following business day.   You may also send [place] a message via MyChart. We typically respond to MyChart messages within 1-2 business days.  For prescription refills, please ask your pharmacy to contact our office. Our fax number is [phone].  If you have an urgent issue when the clinic is closed that cannot wait until the next business day, you can page your doctor at the number below.    Please note that while we do our best to be available for urgent issues outside of office hours, we are not available 24/7.   If you have an urgent issue and are unable to reach [place], you may choose to seek medical care at your doctor's office, retail clinic, urgent care center, or emergency room.  If you have a medical emergency, please immediately call 911 or go to the emergency department. In the event of inclement weather, please call our main line at [phone] for an update on the status of any delays or closures.  Dermatology Medication Tips: Please keep the boxes that topical medications come in in order to help keep track of the instructions about where and how to use these. Pharmacies typically print the medication instructions only on the boxes and not directly on the medication tubes.   If your medication is too expensive, please contact  our office at [phone] or send [place] a message through MyChart.   We are unable to tell what your co-pay for medications will be in advance as this is different depending on your insurance coverage. However, we may be able to find a substitute medication at lower cost or fill out paperwork to get insurance to cover a needed medication.   If a prior authorization is required to get your medication covered by your insurance company, please allow [place] 1-2 business days to complete this process.  Drug  prices often vary depending on where the prescription is filled and some pharmacies may offer cheaper prices.  The website www.goodrx.com contains coupons for medications through different pharmacies. The prices here do not account for what the cost may be with help from insurance (it may be cheaper with your insurance), but the website can give you the price if you did not use any insurance.  - You can print the associated coupon and take it with your prescription to the pharmacy.  - You may also stop by our office during regular business hours and pick up a GoodRx coupon card.  - If you need your prescription sent electronically to a different pharmacy, notify our office through [facility] or by phone at [phone]

## 2023-11-14 NOTE — Progress Notes (Unsigned)
   Follow-Up Visit   Subjective  Glenn Robertson is a 58 y.o. male who presents for the following: follow up for [facility]  Patient present today for follow up visit for [facility]. Patient was last evaluated on 05/17/2023. Patient reports that the rash is still there not improving and has been using the CeraVe on the rash. Patient stated that he has some scabs in his ear.  The following portions of the chart were reviewed this encounter and updated as appropriate: medications, allergies, medical history  Review of Systems:  No other skin or systemic complaints except as noted in HPI or Assessment and Plan.  Objective  Well appearing patient in no apparent distress; mood and affect are within normal limits.    A focused examination was performed of the following areas: ankle  Relevant exam findings are noted in the Assessment and Plan.    Assessment & Plan   POST-INFLAMMATORY HYPERPIGMENTATION (PIH) Exam: hyperpigmented macules and/or patches at right ankle and right arm   This is a benign condition that comes from having previous inflammation in the skin and will fade with time over months to sometimes years. Recommend daily sun protection including sunscreen SPF 30+ to sun-exposed areas. - Recommend treating any itchy or red areas on the skin quickly to prevent new areas of PIH. Treating with prescription medicines such as hydroquinone may help fade dark spots faster.    Treatment Plan:          Continue CeraVe SA cream  Recomment Auaphor after CeraVe   SEBORRHEIC DERMATITIS Exam: Pink patches with greasy scale at ears   Seborrheic Dermatitis is a chronic persistent rash characterized by pinkness and scaling most commonly of the mid face but also can occur on the scalp (dandruff), ears; mid chest, mid back and groin.  It tends to be exacerbated by stress and cooler weather.  People who have neurologic disease may experience new onset or exacerbation of existing seborrheic  dermatitis.  The condition is not curable but treatable and can be controlled.  Treatment Plan:  Flucinonide oil for ears for two weeks and once a week after initial period.    No follow-ups on file.  Samara Deist, CMA, am acting as scribe for [employer], DO.   Documentation: I have reviewed the above documentation for accuracy and completeness, and I agree with the above.  Langston Reusing, DO

## 2023-11-16 ENCOUNTER — Encounter: Payer: Self-pay | Admitting: Dermatology

## 2023-11-20 ENCOUNTER — Ambulatory Visit (INDEPENDENT_AMBULATORY_CARE_PROVIDER_SITE_OTHER): Admitting: Family Medicine

## 2023-11-20 ENCOUNTER — Encounter: Payer: Self-pay | Admitting: Family Medicine

## 2023-11-20 VITALS — BP 98/79 | HR 88 | Temp 98.6°F | Ht 69.75 in | Wt 186.8 lb

## 2023-11-20 DIAGNOSIS — R631 Polydipsia: Secondary | ICD-10-CM | POA: Diagnosis not present

## 2023-11-20 DIAGNOSIS — Z7985 Long-term (current) use of injectable non-insulin antidiabetic drugs: Secondary | ICD-10-CM

## 2023-11-20 DIAGNOSIS — E1165 Type 2 diabetes mellitus with hyperglycemia: Secondary | ICD-10-CM | POA: Diagnosis not present

## 2023-11-20 DIAGNOSIS — Z7984 Long term (current) use of oral hypoglycemic drugs: Secondary | ICD-10-CM | POA: Diagnosis not present

## 2023-11-20 DIAGNOSIS — E7800 Pure hypercholesterolemia, unspecified: Secondary | ICD-10-CM | POA: Diagnosis not present

## 2023-11-20 DIAGNOSIS — J4520 Mild intermittent asthma, uncomplicated: Secondary | ICD-10-CM | POA: Diagnosis not present

## 2023-11-20 DIAGNOSIS — N401 Enlarged prostate with lower urinary tract symptoms: Secondary | ICD-10-CM | POA: Diagnosis not present

## 2023-11-20 DIAGNOSIS — R351 Nocturia: Secondary | ICD-10-CM | POA: Diagnosis not present

## 2023-11-20 DIAGNOSIS — E538 Deficiency of other specified B group vitamins: Secondary | ICD-10-CM | POA: Diagnosis not present

## 2023-11-20 MED ORDER — OZEMPIC (0.25 OR 0.5 MG/DOSE) 2 MG/3ML ~~LOC~~ SOPN: 0.2500 mg | PEN_INJECTOR | SUBCUTANEOUS | 3 refills | Status: DC

## 2023-11-20 MED ORDER — ALBUTEROL SULFATE HFA 108 (90 BASE) MCG/ACT IN AERS: 1.0000 | INHALATION_SPRAY | Freq: Four times a day (QID) | RESPIRATORY_TRACT | 0 refills | Status: AC | PRN

## 2023-11-20 NOTE — Progress Notes (Addendum)
 Established Patient Office Visit   Subjective:  Patient ID: Glenn Robertson, male    DOB: Oct 18, 1965  Age: 58 y.o. MRN: [id number]  Chief Complaint  Patient presents with   Annual Exam    Fasting. No concerns. Med refill on albuterol . Medicare will not cover CPE.    HPI Encounter Diagnoses  Name Primary?   Mild intermittent reactive airway disease without complication Yes   Elevated cholesterol    Vitamin B 12 deficiency    Uncontrolled type 2 diabetes mellitus with hyperglycemia (HCC)    Benign prostatic hyperplasia with nocturia    Polydipsia    For physical and follow-up of above.  He is back to walking for exercise.  He does have regular dental care.  He lives at home with his mother.  Colonoscopy in 2021.  He is interested in a GLP-1 agonist for further weight loss.  He uses bronchodilator sometimes just before exercise.  Continues follow-up with psychiatry.  Urine flow is okay but experiences nocturia x 1 at about 5:00 in the morning.  He drinks over 100 ounces of water daily and right up until bedtime.   Review of Systems  Constitutional: Negative.   HENT: Negative.    Eyes:  Negative for blurred vision, discharge and redness.  Respiratory: Negative.    Cardiovascular: Negative.   Gastrointestinal:  Negative for abdominal pain.  Genitourinary: Negative.   Musculoskeletal: Negative.  Negative for myalgias.  Skin:  Negative for rash.  Neurological:  Negative for tingling, loss of consciousness and weakness.  Endo/Heme/Allergies:  Negative for polydipsia.      11/20/2023    9:39 AM 11/20/2023    9:00 AM 03/08/2023    9:02 AM  Depression screen PHQ 2/9  Decreased Interest 0 0   Down, Depressed, Hopeless 0 0   PHQ - 2 Score 0 0   Altered sleeping 1    Tired, decreased energy 1    Change in appetite 0    Feeling bad or failure about yourself  1    Trouble concentrating 1    Moving slowly or fidgety/restless 1    Suicidal thoughts 0    PHQ-9 Score 5    Difficult  doing work/chores Somewhat difficult       Information is confidential and restricted. Go to Review Flowsheets to unlock data.       Current Outpatient Medications:    ACCU-CHEK SOFTCLIX LANCETS lancets, 3 (three) times daily. for testing, Disp: , Rfl: 0   amantadine  (SYMMETREL ) 100 MG capsule, Take 1 capsule (100 mg total) by mouth 2 (two) times daily., Disp: 60 capsule, Rfl: 2   Blood Glucose Monitoring Suppl (ACCU-CHEK GUIDE ME) w/Device KIT, USE AS DIRECTED, Disp: 1 kit, Rfl: 0   Cariprazine  HCl (VRAYLAR ) 6 MG CAPS, Take 1 capsule (6 mg total) by mouth daily. TAKE 1 CAPSULE(6 MG) BY MOUTH DAILY, Disp: 30 capsule, Rfl: 2   clonazePAM  (KLONOPIN ) 0.5 MG tablet, Take 1 tablet (0.5 mg total) by mouth daily., Disp: 30 tablet, Rfl: 0   empagliflozin  (JARDIANCE ) 25 MG TABS tablet, Take 1 tablet (25 mg total) by mouth daily., Disp: 90 tablet, Rfl: 2   fexofenadine (ALLEGRA ODT) 30 MG disintegrating tablet, Take 30 mg by mouth daily., Disp: , Rfl:    Fluocinolone  Acetonide Body 0.01 % OIL, Apply 1 Application topically as directed. Apply to ears daily for 2 weeks and then weekly as needed., Disp: 120 mL, Rfl: 2   fluPHENAZine  (PROLIXIN ) 5 MG tablet, Take  1.5 tablets (7.5 mg total) by mouth at bedtime., Disp: 45 tablet, Rfl: 2   fluticasone  (FLONASE ) 50 MCG/ACT nasal spray, SHAKE LIQUID AND USE 2 SPRAYS IN EACH NOSTRIL DAILY, Disp: 16 g, Rfl: 6   glipiZIDE  (GLUCOTROL ) 10 MG tablet, TAKE 1 TABLET(10 MG) BY MOUTH TWICE DAILY, Disp: 360 tablet, Rfl: 3   glucose blood (ACCU-CHEK GUIDE) test strip, 1 each by Other route in the morning, at noon, and at bedtime. Use to test glucose levels 3 times daily, Disp: 200 strip, Rfl: 3   metFORMIN  (GLUCOPHAGE -XR) 500 MG 24 hr tablet, TAKE 2 TABLETS(1000 MG) BY MOUTH TWICE DAILY, Disp: 360 tablet, Rfl: 3   montelukast  (SINGULAIR ) 10 MG tablet, TAKE 1 TABLET(10 MG) BY MOUTH AT BEDTIME, Disp: 90 tablet, Rfl: 2   omeprazole  (PRILOSEC) 20 MG capsule, TAKE 1 CAPSULE(20  MG) BY MOUTH DAILY, Disp: 30 capsule, Rfl: 3   Semaglutide ,0.25 or 0.5MG /DOS, (OZEMPIC , 0.25 OR 0.5 MG/DOSE,) 2 MG/3ML SOPN, Inject 0.25 mg into the skin once a week., Disp: 3 mL, Rfl: 3   sertraline  (ZOLOFT ) 100 MG tablet, Take 1.5 tablets (150 mg total) by mouth every morning., Disp: 45 tablet, Rfl: 2   simvastatin  (ZOCOR ) 20 MG tablet, TAKE 1 TABLET(20 MG) BY MOUTH EVERY EVENING, Disp: 90 tablet, Rfl: 3   tadalafil  (CIALIS ) 20 MG tablet, Take 10-20 mg by mouth daily as needed., Disp: , Rfl:    traZODone  (DESYREL ) 50 MG tablet, Take 1 tablet (50 mg total) by mouth at bedtime as needed for sleep., Disp: 30 tablet, Rfl: 2   albuterol  (VENTOLIN  HFA) 108 (90 Base) MCG/ACT inhaler, Inhale 1-2 puffs into the lungs every 6 (six) hours as needed for wheezing or shortness of breath., Disp: 18 g, Rfl: 0   chlorhexidine  (HIBICLENS ) 4 % external liquid, Apply topically daily as needed. (Patient not taking: Reported on 11/20/2023), Disp: 118 mL, Rfl: 0   mupirocin  ointment (BACTROBAN ) 2 %, Apply 1 Application topically daily. (Patient not taking: Reported on 11/20/2023), Disp: 22 g, Rfl: 0   TYRVAYA 0.03 MG/ACT SOLN, Spray 1 spray in each nostril twice daily, approximately 12 hours apart (Patient not taking: Reported on 11/20/2023), Disp: , Rfl:   Current Facility-Administered Medications:    methylPREDNISolone  acetate (DEPO-MEDROL ) injection 80 mg, 80 mg, Other, Once, Eldonna Novel, MD   Objective:     BP 98/79 (BP Location: Left Arm, Patient Position: Sitting)   Pulse 88   Temp 98.6 F (37 C)   Ht 5' 9.75 (1.772 m)   Wt 186 lb 12.8 oz (84.7 kg)   SpO2 95%   BMI 27.00 kg/m    Physical Exam Constitutional:      General: He is not in acute distress.    Appearance: Normal appearance. He is not ill-appearing, toxic-appearing or diaphoretic.  HENT:     Head: Normocephalic and atraumatic.     Right Ear: External ear normal.     Left Ear: External ear normal.     Mouth/Throat:     Mouth: Mucous  membranes are moist.     Pharynx: Oropharynx is clear. No oropharyngeal exudate or posterior oropharyngeal erythema.  Eyes:     General: No scleral icterus.       Right eye: No discharge.        Left eye: No discharge.     Extraocular Movements: Extraocular movements intact.     Conjunctiva/sclera: Conjunctivae normal.     Pupils: Pupils are equal, round, and reactive to light.  Cardiovascular:  Rate and Rhythm: Normal rate and regular rhythm.  Pulmonary:     Effort: Pulmonary effort is normal. No respiratory distress.     Breath sounds: Normal breath sounds.  Abdominal:     General: Bowel sounds are normal.     Tenderness: There is no abdominal tenderness. There is no guarding.     Hernia: There is no hernia in the left inguinal area or right inguinal area.  Genitourinary:    Penis: Circumcised. No hypospadias, erythema, tenderness, discharge, swelling or lesions.      Testes:        Right: Mass, tenderness or swelling not present. Right testis is descended.        Left: Mass, tenderness or swelling not present. Left testis is descended.     Epididymis:     Right: Not inflamed or enlarged.     Left: Not inflamed or enlarged.  Musculoskeletal:     Cervical back: No rigidity or tenderness.  Lymphadenopathy:     Lower Body: No right inguinal adenopathy. No left inguinal adenopathy.  Skin:    General: Skin is warm and dry.  Neurological:     Mental Status: He is alert and oriented to person, place, and time.  Psychiatric:        Mood and Affect: Mood normal.        Behavior: Behavior normal.      No results found for any visits on 11/20/23.    The ASCVD Risk score (Arnett DK, et al., 2019) failed to calculate for the following reasons:   The valid total cholesterol range is 130 to 320 mg/dL    Assessment & Plan:   Mild intermittent reactive airway disease without complication -     Albuterol  Sulfate HFA; Inhale 1-2 puffs into the lungs every 6 (six) hours as needed  for wheezing or shortness of breath.  Dispense: 18 g; Refill: 0  Elevated cholesterol -     Comprehensive metabolic panel -     Lipid panel  Vitamin B 12 deficiency -     CBC -     Vitamin B12  Uncontrolled type 2 diabetes mellitus with hyperglycemia (HCC) -     Comprehensive metabolic panel -     Urinalysis, Routine w reflex microscopic -     Hemoglobin A1c -     Microalbumin / creatinine urine ratio -     Ozempic  (0.25 or 0.5 MG/DOSE); Inject 0.25 mg into the skin once a week.  Dispense: 3 mL; Refill: 3  Benign prostatic hyperplasia with nocturia -     PSA  Polydipsia    Return in about 3 months (around 02/20/2024).  Will start semaglutide  for improved diabetes control.  He will continue empagliflozin  and glipizide .  Discussed common side effects of constipation and/or diarrhea and nausea.  Advised of risks of severe abdominal pain that could indicate pancreatitis, gallbladder disease or intestinal obstruction.  These would need emergent evaluation.  Discussed the risk of thyroid  C cancer.  Encouraged regular exercise by walking.  He will avoid fluids a couple hours before bedtime.  Recommended that he  consume more than 32 to 48 ounces of water daily.   Elsie Sim Lent, MD

## 2023-11-22 ENCOUNTER — Other Ambulatory Visit: Payer: Self-pay | Admitting: Family

## 2023-11-22 DIAGNOSIS — R052 Subacute cough: Secondary | ICD-10-CM

## 2023-11-27 ENCOUNTER — Other Ambulatory Visit: Payer: Self-pay | Admitting: Family Medicine

## 2023-11-28 ENCOUNTER — Other Ambulatory Visit: Payer: Self-pay | Admitting: Family Medicine

## 2023-11-28 DIAGNOSIS — R052 Subacute cough: Secondary | ICD-10-CM

## 2023-11-28 MED ORDER — OMEPRAZOLE 20 MG PO CPDR: 20.0000 mg | DELAYED_RELEASE_CAPSULE | Freq: Every day | ORAL | 3 refills | Status: DC

## 2023-11-28 NOTE — Telephone Encounter (Signed)
 Copied from CRM [id number]. Topic: Clinical - Medication Refill >> Nov 28, 2023 11:25 AM Elizebeth Brooking wrote: Most Recent Primary Care Visit:  Provider: Mliss Sax  Department: [facility]  Visit Type: OFFICE VISIT  Date: 11/20/2023  Medication: omeprazole (PRILOSEC) 20 MG capsule  Has the patient contacted their pharmacy? Yes (Agent: If no, request that the patient contact the pharmacy for the refill. If patient does not wish to contact the pharmacy document the reason why and proceed with request.) (Agent: If yes, when and what did the pharmacy advise?)  Is this the correct pharmacy for this prescription? Yes If no, delete pharmacy and type the correct one.  This is the patient's preferred pharmacy:  [id number], [place] - [address] AT [address] [place] [address] Phone: [phone] Fax: [phone]  [facility] [address] [place], [place] - [address] AT [address] [place]. [place] [address] Phone: [phone] Fax: [phone]   Has the prescription been filled recently? No  Is the patient out of the medication? Yes  Has the patient been seen for an appointment in the last year OR does the patient have an upcoming appointment? Yes  Can we respond through MyChart? Yes  Agent: Please be advised that Rx refills may take up to 3 business days. We ask that you follow-up with your pharmacy.

## 2024-01-02 ENCOUNTER — Telehealth: Payer: Self-pay | Admitting: *Deleted

## 2024-01-02 NOTE — Patient Outreach (Signed)
 Complex Care Management   Visit Note  01/02/2024  Name:  Glenn Robertson MRN: [id number] DOB: 15-May-1966  Situation: Referral received for Complex Care Management related to Diabetes with Complications I obtained verbal consent from Patient.  Visit completed with patient  on the phone  Offered case management services again, patient declined to participate.  State he is doing well, will be starting Ozempic .  Offered additional education resources on how to manage diabetes, he declined.   Background:   Past Medical History:  Diagnosis Date   Allergy    dog and cats, citrus   Anxiety    Asthma    Chronic pain    Depression    Diabetes mellitus    Glaucoma    Neuromuscular disorder (HCC)    Paranoid schizophrenia (HCC)     Assessment: Patient Reported Symptoms:  Cognitive Cognitive Status: Alert and oriented to person, place, and time, Normal speech and language skills Cognitive/Intellectual Conditions Management [RPT]: None reported or documented in medical history or problem list      Neurological Neurological Review of Symptoms: Not assessed    HEENT HEENT Symptoms Reported: Not assessed      Cardiovascular Cardiovascular Symptoms Reported: Not assessed    Respiratory Respiratory Symptoms Reported: Not assesed    Endocrine Patient reports the following symptoms related to hypoglycemia or hyperglycemia : Not assessed    Gastrointestinal Gastrointestinal Symptoms Reported: Not assessed      Genitourinary Genitourinary Symptoms Reported: Not assessed    Integumentary Integumentary Symptoms Reported: Not assessed    Musculoskeletal Musculoskelatal Symptoms Reviewed: Not assessed        Psychosocial Psychosocial Symptoms Reported: Not assessed            11/20/2023    9:39 AM  Depression screen PHQ 2/9  Decreased Interest 0  Down, Depressed, Hopeless 0  PHQ - 2 Score 0  Altered sleeping 1  Tired, decreased energy 1  Change in appetite 0  Feeling bad or  failure about yourself  1  Trouble concentrating 1  Moving slowly or fidgety/restless 1  Suicidal thoughts 0  PHQ-9 Score 5  Difficult doing work/chores Somewhat difficult    There were no vitals filed for this visit.  Medications Reviewed Today   Medications were not reviewed in this encounter     Recommendation:   PCP Follow-up Call PCP office or pharmacy if you have questions about Ozempic  administration  Follow Up Plan:   Closing From:  Complex Care Management  Holland Lundborg, RN, MSN, CCM [facility]  [facility], [facility] RN Care Coordinator Direct Dial: [phone] / Main [phone] Fax [phone] Email: [email or web address]

## 2024-01-08 ENCOUNTER — Encounter (HOSPITAL_COMMUNITY): Payer: Self-pay | Admitting: Student

## 2024-01-08 ENCOUNTER — Ambulatory Visit (HOSPITAL_BASED_OUTPATIENT_CLINIC_OR_DEPARTMENT_OTHER): Admitting: Student

## 2024-01-08 DIAGNOSIS — F3341 Major depressive disorder, recurrent, in partial remission: Secondary | ICD-10-CM | POA: Diagnosis not present

## 2024-01-08 DIAGNOSIS — Z79899 Other long term (current) drug therapy: Secondary | ICD-10-CM | POA: Diagnosis not present

## 2024-01-08 DIAGNOSIS — F200 Paranoid schizophrenia: Secondary | ICD-10-CM | POA: Diagnosis not present

## 2024-01-08 DIAGNOSIS — G4700 Insomnia, unspecified: Secondary | ICD-10-CM | POA: Diagnosis not present

## 2024-01-08 MED ORDER — TRAZODONE HCL 50 MG PO TABS: 50.0000 mg | ORAL_TABLET | Freq: Every evening | ORAL | 0 refills | Status: DC | PRN

## 2024-01-08 MED ORDER — VRAYLAR 6 MG PO CAPS: 6.0000 mg | ORAL_CAPSULE | Freq: Every day | ORAL | 0 refills | Status: DC

## 2024-01-08 MED ORDER — CLONAZEPAM 0.5 MG PO TABS: 0.2500 mg | ORAL_TABLET | Freq: Every day | ORAL | 0 refills | Status: DC

## 2024-01-08 MED ORDER — FLUPHENAZINE HCL 5 MG PO TABS: 7.5000 mg | ORAL_TABLET | Freq: Every day | ORAL | 0 refills | Status: DC

## 2024-01-08 MED ORDER — SERTRALINE HCL 100 MG PO TABS: 150.0000 mg | ORAL_TABLET | Freq: Every morning | ORAL | 0 refills | Status: DC

## 2024-01-08 MED ORDER — AMANTADINE HCL 100 MG PO CAPS: 100.0000 mg | ORAL_CAPSULE | Freq: Two times a day (BID) | ORAL | 0 refills | Status: DC

## 2024-01-08 NOTE — Addendum Note (Signed)
 Addended by: Donnelly Gainer on: 01/08/2024 01:45 PM   Modules accepted: Level of Service

## 2024-01-08 NOTE — Progress Notes (Signed)
 BH MD Outpatient Progress Note  01/08/2024 12:34 PM Glenn Robertson  MRN: [id number]  Assessment:  Glenn Robertson presents for follow-up evaluation in-person.   Identifying Information: Glenn Robertson is a 58 y.o. male with a history of paranoid schizophrenia, MDD, no suicide attempt or inpatient psych admission, who is an established patient with [facility] for management of delusions and depressed mood.   Risk Assessment: An assessment of suicide and violence risk factors was performed as part of this evaluation and is not significantly changed from the last visit.             While future psychiatric events cannot be accurately predicted, the patient does not currently require acute inpatient psychiatric care and does not currently meet [place]  involuntary commitment criteria.          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 Some mild persecutory delusions + blunted affect may be his baseline (ex: gossip/talking about him).  Unclear reason for patient's klonopin , suspect for anxiety related paranoia. He denied anxiety outside of the delusion. For this reason, will work on tapering patient off klonopin  slowly.  Tolerating klonopin  taper well, no worsening anxiety, no emergence of EPS sxs, no worsening of tremors. No paranoia at since last encounter, but no insight about previous delusions (still believed that private investigator followed him in park over the winter).  Endorsed brief episodes of hearing loss for unclear reasons. Duration, frequency, with who it occurs to all varies and has  been ongoing for years. I recommended that he talk to his PCP for neuro referral to talk about it and to keep a log of when it happens. Continuing to taper off klonopin  per below. Interventions: Antipsychotic labs - 06/2023 EKG - Qtc 420 - 09/07/2023 AIMS 0 (09/14/2023) Continued home vraylar  6 mg daily Continued home prolixin  7.5 mg qHS DECREASED home klonopin  0.5 mg --> 0.25 mg x30 (d5/07/2024)  # EPS - dystonia # Tremors vs parkinsonism Past medication trials:  Status of problem: stable AIMS 0 (04/12/2023), no dystonia on physical exam (04/12/2023). Does have action tremor b/l, none at rest. Suspect multifactorial 2/2 meds, DM2, essential tremor, possible neurocognitive d/o given issues with memory (2/3 delayed recall).  No action tremor, but did notice mild pill rolling in left hand, disappeared when pointed it out. Has been there for quite some time, unclear if 2/2 med, but decrease in klonopin  is not worsening it.  Interventions:  Continued home amantadine  100 mg BID  # MDD in partial remission Past medication trials:  Status of problem: stable Depressed mood is much improved, evident by increased motivation to resume his old hobbies. He rarely uses trazodone .  Interventions: Continued home zoloft  150 mg daily Continued home trazodone  50 mg at bedtime PRN  Health Maintenance PCP: Tonna Frederic, MD  DM2 - metformin  1000 mg BID, jardiance  25 mg, glipizide  10 mg BID Exercised induced asthma - singulair  10 mg daily, allegra daily, tyrvaya nasal spray BID HLD - simvastatin  20 mg daily ED - cialis  20 mg PRN  Return to care in: Future Appointments  Date Time Provider Department Center  01/29/2024  9:15 AM Diedra Fowler, MD [facility] None  02/05/2024 10:30 AM  Khaled Herda, DO [facility] None  02/20/2024  8:40 AM Tonna Frederic, MD [facility]  05/21/2024 10:45 AM Dellar Fenton, DO [facility] None   Patient was given contact information for behavioral health clinic and was  instructed to call 911 for emergencies.   Patient and plan of care will be discussed with the Attending MD, Dr. Sharalyn Dasen, who agrees with the above statement and plan.   Subjective:  Chief Complaint:  Chief Complaint  Patient presents with   Medication Management   Paranoid   Follow-up   Interval History:   PDMP klonopin  0.5 mg qty 30 x30day - 12/27/2023  Unaccompanied.  Private investigator - none since last encounter. Still believes that someone was watching and following him at the park over the winter. Unsure why, doesn't feel threatened. Feels safe since last encounter. Hasn't heard anyone gossip about him recently. No one following him or watching him.   Hearing drop once in a while, can't explain it. No particular frequency, duration, with who it happens with. No change in it for years. Hasn't talked to neuro about it yet.  Recommended that he talk to neuro about it and keep a log so we can find a pattern  Tremor: the same, no change, worst with low blood sugars, at times when he is picking up things, at time when he is sitting there.   Mood: "pretty much the same".  -hasn't noticed any changes in anxiety or depression since decreasing the klonopin  -still feels pretty good -hasn't tested his concentration yet since decreasing the klonopin   Sleep: going pretty good--12am/2am to 6/am8am -stays up to do laundry because mom sleeps during the day and doesn't want to disturb her  Appetite: unchanged, good. Has been working on losing weight with lifestyle changes, has been outside walking again. Bummed about the rain  Patient amenable to decrease klonopin  after discussing the risks, benefits, and side effects. Otherwise patient had no other questions or concerns and was amenable to plan per above.  Safety: Denied active and passive SI, HI, AVH. No paranoid delusions, no persecutory delusion. No FRS. Feels safe, doesn't feel need to defend self or get defensive.   Review of  Systems  Constitutional:  Negative for activity change (more activity), appetite change and fatigue.  Respiratory:  Negative for shortness of breath.   Cardiovascular:  Negative for chest pain.  Gastrointestinal:  Negative for abdominal pain, constipation, diarrhea and nausea.  Neurological:  Positive for tremors (no change, see subjective). Negative for dizziness and headaches.   Aware that next visit is his last appointment with me until the new psychiatrist  Visit Diagnosis:    ICD-10-CM   1. Chronic prescription benzodiazepine use  Z79.899 clonazePAM  (KLONOPIN ) 0.5 MG tablet    2. Paranoid schizophrenia, chronic condition (HCC)  F20.0 amantadine  (SYMMETREL ) 100 MG capsule    Cariprazine  HCl (VRAYLAR ) 6 MG CAPS    fluPHENAZine  (PROLIXIN ) 5 MG tablet    3. Recurrent major depressive disorder, in partial remission (HCC)  F33.41 sertraline  (ZOLOFT ) 100 MG tablet    4. Insomnia, unspecified type  G47.00 traZODone  (DESYREL ) 50 MG tablet      Past Psychiatric History:  Diagnoses: Paranoid schizophrenia, MDD, schizoaffective d/o-depressed mood Questioned dx of paranoid schizophrenia (previously schizoaffective d/o) vs delusional d/o. Does have affect blunting consistent with scz, unless blunting affect 2/2 his antipsychotics, however when decreased his antipsychotics (prolixin +vraylar ), had return of delusional sxs Medication trials:  Klonopin , gabapentin , lyrica amantadine  Wellbutrin, zoloft , trazodone  [name], latuda, fanapt, geodon,  Prolixin , vraylar  Previous psychiatrist/therapist: yes Hospitalizations: NA Suicide attempts: NA Hx of violence towards others: Denied Current access to guns: Denied  Substance Use History: EtOH:  reports no history of alcohol use. Nicotine:  reports that he has never smoked. He has never used smokeless tobacco. Marijuana: Denied IV drug use: Denied Stimulants: Denied Opiates: Denied Sedative/hypnotics: Denied Hallucinogens: Denied  Past  Medical History: Dx:  has a past medical history of Allergy, Anxiety, Asthma, Chronic pain, Depression, Diabetes mellitus, Glaucoma, Neuromuscular disorder (HCC), and Paranoid schizophrenia (HCC).  Seizures: no, EEG x2 (2020, 2023) WNL Allergies: Cat dander, Citric acid, Food, and Gabapentin    Family Psychiatric History:  Mother- Depression and PTSD, on Abilify Paternal Aunt- Paranoia, hoarding  Social History:  Housing: Living with mom Income: disability for mental health  Past Medical History:  Past Medical History:  Diagnosis Date   Allergy    dog and cats, citrus   Anxiety    Asthma    Chronic pain    Depression    Diabetes mellitus    Glaucoma    Neuromuscular disorder (HCC)    Paranoid schizophrenia (HCC)     Past Surgical History:  Procedure Laterality Date   ANKLE ARTHROSCOPY     Arm Surgery  1/12   rt ulnar nerve decompression   EPIDURAL BLOCK INJECTION     several   ULNAR NERVE TRANSPOSITION  01/19/2012   Procedure: ULNAR NERVE DECOMPRESSION/TRANSPOSITION;  Surgeon: Amelie Baize., MD;  Location: [facility];  Service: Orthopedics;  Laterality: Left;  ulnar nerve decompression vs transposition left elbow   Family History:  Family History  Problem Relation Age of Onset   Diabetes Father    Diabetes Paternal Uncle    Colon cancer Neg Hx    Colon polyps Neg Hx    Esophageal cancer Neg Hx    Rectal cancer Neg Hx    Stomach cancer Neg Hx    Social History   Socioeconomic History   Marital status: Single    Spouse name: Not on file   Number of children: 0   Years of education: BA   Highest education level: Not on file  Occupational History   Occupation: Unemlpoyed-disabled  Tobacco Use   Smoking status: Never   Smokeless tobacco: Never  Vaping Use   Vaping status: Never Used  Substance and Sexual Activity   Alcohol use: No   Drug use: No   Sexual activity: Yes  Other Topics Concern   Not on file  Social History Narrative    Lives with mother   Caffeine use: Coke very rare   Right handed    Social Drivers of Health   Financial Resource Strain: Low Risk  (10/03/2022)   Overall Financial Resource Strain (CARDIA)    Difficulty of Paying Living Expenses: Not hard at all  Food Insecurity: No Food Insecurity (10/03/2022)   Hunger Vital Sign    Worried About Running Out of Food in the Last Year: Never true    Ran Out of Food in the Last Year: Never true  Transportation Needs: No Transportation Needs (10/03/2022)   PRAPARE - Administrator, Civil Service (Medical): No    Lack of Transportation (Non-Medical): No  Physical Activity: Insufficiently Active (10/03/2022)   Exercise Vital Sign    Days of Exercise per Week: 4 days    Minutes of Exercise per Session: 30 min  Stress: Stress Concern Present (10/03/2022)   [employer] of Occupational Health - Occupational  Stress Questionnaire    Feeling of Stress : To some extent  Social Connections: Socially Isolated (09/21/2021)   Social Connection and Isolation Panel [NHANES]    Frequency of Communication with Friends and Family: Three times a week    Frequency of Social Gatherings with Friends and Family: Three times a week    Attends Religious Services: Never    Active Member of Clubs or Organizations: No    Attends Banker Meetings: Not on file    Marital Status: Never married    Allergies:  Allergies  Allergen Reactions   Cat Dander    Citric Acid Other (See Comments)    Runny nose, eyes water   Food     Oranges or anything with citric acid   Gabapentin      Walking into things, hard to maintain balance, falls    Current Medications: Current Outpatient Medications  Medication Sig Dispense Refill   ACCU-CHEK SOFTCLIX LANCETS lancets 3 (three) times daily. for testing  0   albuterol  (VENTOLIN  HFA) 108 (90 Base) MCG/ACT inhaler Inhale 1-2 puffs into the lungs every 6 (six) hours as needed for wheezing or shortness of breath. 18 g 0    amantadine  (SYMMETREL ) 100 MG capsule Take 1 capsule (100 mg total) by mouth 2 (two) times daily. 60 capsule 0   Blood Glucose Monitoring Suppl (ACCU-CHEK GUIDE ME) w/Device KIT USE AS DIRECTED 1 kit 0   Cariprazine  HCl (VRAYLAR ) 6 MG CAPS Take 1 capsule (6 mg total) by mouth daily. TAKE 1 CAPSULE(6 MG) BY MOUTH DAILY 30 capsule 0   chlorhexidine  (HIBICLENS ) 4 % external liquid Apply topically daily as needed. (Patient not taking: Reported on 11/20/2023) 118 mL 0   clonazePAM  (KLONOPIN ) 0.5 MG tablet Take 0.5 tablets (0.25 mg total) by mouth daily. 15 tablet 0   empagliflozin  (JARDIANCE ) 25 MG TABS tablet Take 1 tablet (25 mg total) by mouth daily. 90 tablet 2   fexofenadine (ALLEGRA ODT) 30 MG disintegrating tablet Take 30 mg by mouth daily.     Fluocinolone  Acetonide Body 0.01 % OIL Apply 1 Application topically as directed. Apply to ears daily for 2 weeks and then weekly as needed. 120 mL 2   fluPHENAZine  (PROLIXIN ) 5 MG tablet Take 1.5 tablets (7.5 mg total) by mouth at bedtime. 45 tablet 0   fluticasone  (FLONASE ) 50 MCG/ACT nasal spray SHAKE LIQUID AND USE 2 SPRAYS IN EACH NOSTRIL DAILY 16 g 6   glipiZIDE  (GLUCOTROL ) 10 MG tablet TAKE 1 TABLET(10 MG) BY MOUTH TWICE DAILY 360 tablet 3   glucose blood (ACCU-CHEK GUIDE) test strip 1 each by Other route in the morning, at noon, and at bedtime. Use to test glucose levels 3 times daily 200 strip 3   metFORMIN  (GLUCOPHAGE -XR) 500 MG 24 hr tablet TAKE 2 TABLETS(1000 MG) BY MOUTH TWICE DAILY 360 tablet 3   montelukast  (SINGULAIR ) 10 MG tablet TAKE 1 TABLET(10 MG) BY MOUTH AT BEDTIME 90 tablet 2   mupirocin  ointment (BACTROBAN ) 2 % Apply 1 Application topically daily. (Patient not taking: Reported on 11/20/2023) 22 g 0   omeprazole  (PRILOSEC) 20 MG capsule Take 1 capsule (20 mg total) by mouth daily. 30 capsule 3   Semaglutide ,0.25 or 0.5MG /DOS, (OZEMPIC , 0.25 OR 0.5 MG/DOSE,) 2 MG/3ML SOPN Inject 0.25 mg into the skin once a week. 3 mL 3   sertraline   (ZOLOFT ) 100 MG tablet Take 1.5 tablets (150 mg total) by mouth every morning. 45 tablet 0   simvastatin  (ZOCOR ) 20 MG tablet  TAKE 1 TABLET(20 MG) BY MOUTH EVERY EVENING 90 tablet 3   tadalafil  (CIALIS ) 20 MG tablet Take 10-20 mg by mouth daily as needed.     traZODone  (DESYREL ) 50 MG tablet Take 1 tablet (50 mg total) by mouth at bedtime as needed for sleep. 30 tablet 0   TYRVAYA 0.03 MG/ACT SOLN Spray 1 spray in each nostril twice daily, approximately 12 hours apart (Patient not taking: Reported on 11/20/2023)     Current Facility-Administered Medications  Medication Dose Route Frequency Provider Last Rate Last Admin   methylPREDNISolone  acetate (DEPO-MEDROL ) injection 80 mg  80 mg Other Once Bridget Campion, MD       Objective: Psychiatric Specialty Exam: There were no vitals taken for this visit.There is no height or weight on file to calculate BMI.  General Appearance: Casual, fairly groomed, pleasant, engaged  Eye Contact:  Good    Speech:  Clear, coherent, normal rate   Volume:  Normal   Mood:  see above  Affect:  Appropriate, congruent, blunted  Thought Content: Logical, rumination  Suicidal Thoughts: Denied active and passive SI    Thought Process:  Coherent, goal-directed, linear   Orientation:  A&Ox4   Memory:  Immediate good  Judgment:  Good   Insight:  None about the delusions  Concentration:  Attention and concentration good  Recall:  immediate good, recent good  Fund of Knowledge: Good  Language: Good, fluent  Psychomotor Activity: see above  Akathisia:  see above  AIMS (if indicated): see above  Assets:  Communication Skills Desire for Improvement Housing Leisure Time Physical Health Resilience Social Support Talents/Skills Transportation Vocational/Educational  ADL's:  Intact  Cognition: WNL  Sleep:  see above    PE: General: well-appearing; no acute distress  Pulm: no increased work of breathing on room air  Strength & Muscle Tone: within normal  limits Neuro: no focal neurological deficits observed  Gait & Station: normal  Metabolic Disorder Labs: Lab Results  Component Value Date   HGBA1C 7.6 (H) 07/18/2023   MPG 123 03/02/2018   No results found for: "PROLACTIN" Lab Results  Component Value Date   CHOL 115 11/09/2022   TRIG 60.0 11/09/2022   HDL 52.50 11/09/2022   CHOLHDL 2 11/09/2022   VLDL 12.0 11/09/2022   LDLCALC 50 11/09/2022   LDLCALC 58 07/26/2022   Lab Results  Component Value Date   TSH 1.02 07/08/2019   TSH 0.92 03/15/2018    Therapeutic Level Labs: No results found for: "LITHIUM" No results found for: "VALPROATE" No results found for: "CBMZ"  Screenings: GAD-7    Flowsheet Row Office Visit from 11/20/2023 in [facility] [employer] Visit from 07/26/2022 in [facility] [employer]  Total GAD-7 Score 5 5      PHQ2-9    Flowsheet Row Office Visit from 11/20/2023 in [facility] [employer] from 03/08/2023 in [facility] [place] [facility] from 12/29/2022 in [facility] Visit from 11/17/2022 in [facility] Visit from 11/09/2022 in [facility] Total Score 0 0 0 0 0  PHQ-9 Total Score 5 -- -- -- --      Flowsheet Row UC from 03/31/2023 in [facility] UC from 03/19/2023 in [facility] from 03/08/2023 in [facility] at  [place]  C-SSRS RISK CATEGORY No Risk No Risk No Risk       Patient/Guardian was advised Release of Information must be obtained prior to any record release in order to collaborate their care with an outside provider. Patient/Guardian was advised if they have not already done so to contact the registration department to sign all  necessary forms in order for us  to release information regarding their care.   Consent: Patient/Guardian gives verbal consent for treatment and assignment of benefits for services provided during this visit. Patient/Guardian expressed understanding and agreed to proceed.   Noal Abshier, DO Psych Resident, PGY-3

## 2024-01-29 ENCOUNTER — Other Ambulatory Visit (INDEPENDENT_AMBULATORY_CARE_PROVIDER_SITE_OTHER)

## 2024-01-29 ENCOUNTER — Ambulatory Visit (INDEPENDENT_AMBULATORY_CARE_PROVIDER_SITE_OTHER): Admitting: Orthopedic Surgery

## 2024-01-29 ENCOUNTER — Encounter: Payer: Self-pay | Admitting: Orthopedic Surgery

## 2024-01-29 VITALS — BP 104/73 | HR 81 | Ht 69.75 in | Wt 187.0 lb

## 2024-01-29 DIAGNOSIS — M545 Low back pain, unspecified: Secondary | ICD-10-CM | POA: Diagnosis not present

## 2024-01-29 DIAGNOSIS — G8929 Other chronic pain: Secondary | ICD-10-CM | POA: Diagnosis not present

## 2024-01-29 NOTE — Progress Notes (Signed)
 Orthopedic Spine Surgery Office Note  Assessment: Patient is a 58 y.o. male with periodic low back pain.  Currently not having any pain   Plan: -Explained that initially conservative treatment is tried as a significant number of patients may experience relief with these treatment modalities. Discussed that the conservative treatments include:  -activity modification  -physical therapy  -over the counter pain medications  -medrol  dosepak  -lumbar steroid injections -Patient has tried tylenol   -Can use tylenol  up to 1000mg  TID if he develops pain -With him and asymptomatic at the moment, do not recommend any further intervention at this time.  Discussed core strengthening as a long-term treatment to help with his back.  Demonstrated several exercises that he should work on routinely -Would need an A1c of 7.5 or less prior to any elective spine surgery -Patient should return to office on an as-needed basis   Patient expressed understanding of the plan and all questions were answered to the patient's satisfaction.   ___________________________________________________________________________   History:  Patient is a 58 y.o. male who presents today for lumbar spine.  Patient reports having lower back issues since the 1990s.  He says a lot of low back pain and it comes and goes.  He said he was having back pain in December and January.  There was no trauma or injury that preceded the onset of pain.  He said he noticed it mostly with sitting or leaning forward.  He did not have any pain radiating into his lower extremities.  He said his pain is gotten better since January.  He said he is not having any pain at the moment and is not doing any treatments for his back.  He does have decreased sensation along the lateral aspect of his legs and in his feet which has been going on for several years.  No recent numbness or paresthesias.   Weakness: Denies Symptoms of imbalance: Denies Paresthesias  and numbness: Yes, has decree sensation over the lateral aspect of his legs bilaterally and into the feet bilaterally.  Has been present for years.  No recent changes. Bowel or bladder incontinence: Denies Saddle anesthesia: Denies  Treatments tried: Tylenol   Review of systems: Denies fevers and chills, night sweats, unexplained weight loss, history of cancer, pain that wakes him at night  Past medical history: DM (last A1c was 7.6 on 07/18/2023)  Allergies: gabapentin   Past surgical history:  Ankle arthroscopy Bilateral cubital tunnel release  Social history: Denies use of nicotine product (smoking, vaping, patches, smokeless) Alcohol use: Denies Denies recreational drug use   Physical Exam:  BMI of 27.0  General: no acute distress, appears stated age Neurologic: alert, answering questions appropriately, following commands Respiratory: unlabored breathing on room air, symmetric chest rise Psychiatric: appropriate affect, normal cadence to speech   MSK (spine):  -Strength exam      Left  Right EHL    5/5  5/5 TA    5/5  5/5 GSC    5/5  5/5 Knee extension  5/5  5/5 Hip flexion   5/5  5/5  -Sensory exam    Sensation intact to light touch in L3-S1 nerve distributions of bilateral lower extremities  -Achilles DTR: 1/4 on the left, 1/4 on the right -Patellar tendon DTR: 2/4 on the left, 2/4 on the right  -Straight leg raise: negative bilaterally -Clonus: no beats bilaterally  -Left hip exam: no pain through range of motion -Right hip exam: no pain through range of motion  Imaging: XRs of  the lumbar spine from 01/29/2024 were independently reviewed and interpreted, showing disc height loss with anterior osteophyte formation at L5/S1.  Vacuum disc phenomenon at L5/S1.  No fracture or dislocation seen.  No evidence of instability on flexion/extension views.   Patient name: Glenn Robertson Patient MRN: [id number] Date of visit: 01/29/24

## 2024-02-05 ENCOUNTER — Ambulatory Visit (HOSPITAL_BASED_OUTPATIENT_CLINIC_OR_DEPARTMENT_OTHER): Admitting: Student

## 2024-02-05 ENCOUNTER — Encounter (HOSPITAL_COMMUNITY): Payer: Self-pay | Admitting: Student

## 2024-02-05 VITALS — Wt 186.6 lb

## 2024-02-05 DIAGNOSIS — F3342 Major depressive disorder, recurrent, in full remission: Secondary | ICD-10-CM | POA: Diagnosis not present

## 2024-02-05 DIAGNOSIS — G4700 Insomnia, unspecified: Secondary | ICD-10-CM

## 2024-02-05 DIAGNOSIS — F200 Paranoid schizophrenia: Secondary | ICD-10-CM

## 2024-02-05 DIAGNOSIS — Z79899 Other long term (current) drug therapy: Secondary | ICD-10-CM | POA: Diagnosis not present

## 2024-02-05 MED ORDER — AMANTADINE HCL 100 MG PO CAPS
100.0000 mg | ORAL_CAPSULE | Freq: Two times a day (BID) | ORAL | 2 refills | Status: DC
Start: 2024-02-05 — End: 2024-03-11

## 2024-02-05 MED ORDER — CLONAZEPAM 0.5 MG PO TABS: 0.2500 mg | ORAL_TABLET | Freq: Every day | ORAL | 2 refills | Status: DC

## 2024-02-05 MED ORDER — VRAYLAR 6 MG PO CAPS: 6.0000 mg | ORAL_CAPSULE | Freq: Every day | ORAL | 2 refills | Status: DC

## 2024-02-05 MED ORDER — FLUPHENAZINE HCL 5 MG PO TABS: 7.5000 mg | ORAL_TABLET | Freq: Every day | ORAL | 2 refills | Status: DC

## 2024-02-05 MED ORDER — SERTRALINE HCL 100 MG PO TABS: 150.0000 mg | ORAL_TABLET | Freq: Every morning | ORAL | 2 refills | Status: DC

## 2024-02-05 MED ORDER — TRAZODONE HCL 50 MG PO TABS: 50.0000 mg | ORAL_TABLET | Freq: Every evening | ORAL | 2 refills | Status: DC | PRN

## 2024-02-05 NOTE — Progress Notes (Signed)
 BH MD Outpatient Progress Note  02/05/2024 12:31 PM Kahlin DALESSANDRO BALDYGA  MRN: [id number]  Assessment:  Glenn Robertson presents for follow-up evaluation in-person.   Identifying Information: Glenn Robertson is a 58 y.o. male with a history of paranoid schizophrenia, MDD, no suicide attempt or inpatient psych admission, who is an established patient with [facility] for management of delusions and depressed mood.   Risk Assessment: An assessment of suicide and violence risk factors was performed as part of this evaluation and is not significantly changed from the last visit.             While future psychiatric events cannot be accurately predicted, the patient does not currently require acute inpatient psychiatric care and does not currently meet [facility]  involuntary commitment criteria.          Plan: No med changes 02/05/2024   # Paranoid schizophrenia  Dual antipsychotics # Chronic benzodiazepine use Past medication trials:  Status of problem: stable When symptomatic--paranoid and persecutory delusions--random stalkers; people from past (family, acquaintance, neighbors) stalking him or harassing him (loud music, gossiping); [employer] investigating him for training, feeling restless; having thoughts of "hurting them", without intent or plan, no h/o violence. Some mild persecutory delusions + blunted affect may be his baseline (ex: gossip/talking about him).  Unclear reason for patient's klonopin , suspect for anxiety related paranoia. He denied anxiety outside of the delusion. For this reason, will work on tapering patient off klonopin  slowly.  02/05/2024: Tolerated decrease in klonopin  well, he appears to be a bit more physically more restless, which I suspect is akathesia from the dual antipsychotics or could be anxiety. He doesn't notice it and denies subjective feelings of worsened anxiety or restlessness. Didn't mention issues with hearing. No breakthrough sxs of psychosis,  he actually appears to be brighter and have more affect. This could be because he is able to go outside more since it is warmer. However since he does appear a bit more restless physically, holding on further med changes at this time.  Interventions: Therapy: referred  Antipsychotic labs - 06/2023 EKG - Qtc 420 - 09/07/2023 AIMS 0 (09/14/2023) Continued home vraylar  6 mg daily Continued home prolixin  7.5 mg qHS Continued home klonopin  0.25 mg daily (d5/07/2024)  # EPS - dystonia # Tremors vs parkinsonism Past medication trials:  Status of problem: stable AIMS 0 (04/12/2023), no dystonia on physical exam (04/12/2023). Does have action tremor b/l, none at rest. Suspect multifactorial 2/2 meds, DM2, essential tremor, possible neurocognitive d/o given issues with memory (2/3 delayed recall).  02/05/2024: No action tremor, but did notice mild pill rolling in left hand, disappeared when pointed it out. Has been there for quite some time, unclear if 2/2 med, but decrease in klonopin  is not worsening it - same, no change Interventions:  Continued home amantadine  100 mg BID  # MDD in remission Past medication trials:  Status of problem: stable 02/05/2024: Denied depressed mood, likely because of improved weather and foot injury has healed so he is more active again. Wondering if there is a seasonal component, he is unsure. Could consider recommending light box therapy upcoming winter and fall if mood is depressed again. Interventions: Continued home zoloft  150 mg daily Continued home trazodone  50 mg at bedtime PRN  Health Maintenance PCP: Tonna Frederic, MD  DM2 - metformin  1000 mg BID, jardiance  25 mg, glipizide  10 mg BID Exercised induced asthma - singulair  10 mg daily, allegra daily, tyrvaya nasal spray BID HLD -  simvastatin  20 mg daily ED - cialis  20 mg PRN  Return to care in:  Future Appointments  Date Time Provider Department Center  03/11/2024  9:00 AM Norbert Bean, MD [facility]  None  03/19/2024  8:40 AM Tonna Frederic, MD [facility]  05/21/2024 10:45 AM Dellar Fenton, DO [facility] None   Patient was given contact information for behavioral health clinic and was instructed to call 911 for emergencies.   Patient and plan of care will be discussed with the Attending MD, Dr. Sharalyn Dasen, who agrees with the above statement and plan.   Subjective:  Chief Complaint:  Chief Complaint  Patient presents with   Medication Management   Medication Refill   Follow-up   Interval History:   PDMP klonopin  0.5 mg qty 30 x30day - 12/27/2023  Unaccompanied.  Denied people bothering him or talking about him. Doesn't feel like anyone is following him. He hopes that they never come back, which would be nice. Feels safe, no safety concerns.   Hasn't notice change in tremor after decreasing klonopin , but still there. He doesn't think about it or notices it often.   Mood: "pretty much the same". Denied depressed mood. Denied worsening or change in anxiety or irritability -still feels pretty good -hasn't tested his concentration yet since decreasing the klonopin , but he is reading a news article that he printed -he's glad that the weather is warmer so he can go out and do photography which he likes to do -plans to get back into running as well -unsure if his depressed mood is worst during winter and fall months  Sleep: going pretty good--12am/2am to 6am/8am - still broken up, but able to stay awake to do what he wants/needs. Feels restful. Decreasing klonopin  has not changed his sleep.  Appetite: unchanged, good, appetite intact  Patient amenable to decrease klonopin  after discussing the risks, benefits, and side effects. Otherwise patient had no other questions or concerns and was amenable to plan per above.  Safety: Denied active and passive SI, HI, AVH. No paranoid delusions, no persecutory delusion. No FRS. Feels safe, doesn't feel need to defend self or get  defensive.   Review of Systems  Constitutional:  Negative for activity change (more activity), appetite change and fatigue.  Respiratory:  Negative for shortness of breath.   Cardiovascular:  Negative for chest pain.  Gastrointestinal:  Negative for abdominal pain, constipation, diarrhea and nausea.  Neurological:  Positive for tremors (no change, see subjective). Negative for dizziness and headaches.   Aware that next visit is his last appointment with me until the new psychiatrist  Visit Diagnosis:    ICD-10-CM   1. Chronic prescription benzodiazepine use  Z79.899 clonazePAM  (KLONOPIN ) 0.5 MG tablet    2. Paranoid schizophrenia, chronic condition (HCC)  F20.0 Cariprazine  HCl (VRAYLAR ) 6 MG CAPS    amantadine  (SYMMETREL ) 100 MG capsule    fluPHENAZine  (PROLIXIN ) 5 MG tablet    3. MDD (major depressive disorder), recurrent, in full remission (HCC)  F33.42 Cariprazine  HCl (VRAYLAR ) 6 MG CAPS    sertraline  (ZOLOFT ) 100 MG tablet    traZODone  (DESYREL ) 50 MG tablet    4. Long term current use of antipsychotic medication  Z79.899 Cariprazine  HCl (VRAYLAR ) 6 MG CAPS    fluPHENAZine  (PROLIXIN ) 5 MG tablet    5. Insomnia, unspecified type  G47.00 traZODone  (DESYREL ) 50 MG tablet     Past Psychiatric History:  Diagnoses: Paranoid schizophrenia, MDD, schizoaffective d/o-depressed mood Questioned dx of paranoid schizophrenia (previously schizoaffective d/o)  vs delusional d/o. Does have affect blunting consistent with scz, unless blunting affect 2/2 his antipsychotics, however when decreased his antipsychotics (prolixin +vraylar ), had return of delusional sxs Medication trials:  Klonopin , gabapentin , lyrica amantadine  Wellbutrin, zoloft , trazodone  [name], latuda, fanapt, geodon, Prolixin , vraylar  Previous psychiatrist/therapist: yes Hospitalizations: NA Suicide attempts: NA Hx of violence towards others: Denied Current access to guns: Denied  Substance Use History: EtOH:  reports no  history of alcohol use. Nicotine:  reports that he has never smoked. He has never used smokeless tobacco. Marijuana: Denied IV drug use: Denied Stimulants: Denied Opiates: Denied Sedative/hypnotics: Denied Hallucinogens: Denied  Past Medical History: Dx:  has a past medical history of Allergy, Anxiety, Asthma, Chronic pain, Depression, Diabetes mellitus, Glaucoma, Neuromuscular disorder (HCC), and Paranoid schizophrenia (HCC).  Seizures: no, EEG x2 (2020, 2023) WNL Allergies: Cat dander, Citric acid, Food, and Gabapentin    Family Psychiatric History:  Mother- Depression and PTSD, on Abilify Paternal Aunt- Paranoia, hoarding  Social History:  Housing: Living with mom Income: disability for mental health  Past Medical History:  Past Medical History:  Diagnosis Date   Allergy    dog and cats, citrus   Anxiety    Asthma    Chronic pain    Depression    Diabetes mellitus    Glaucoma    Neuromuscular disorder (HCC)    Paranoid schizophrenia (HCC)     Past Surgical History:  Procedure Laterality Date   ANKLE ARTHROSCOPY     Arm Surgery  1/12   rt ulnar nerve decompression   EPIDURAL BLOCK INJECTION     several   ULNAR NERVE TRANSPOSITION  01/19/2012   Procedure: ULNAR NERVE DECOMPRESSION/TRANSPOSITION;  Surgeon: Amelie Baize., MD;  Location: [facility];  Service: Orthopedics;  Laterality: Left;  ulnar nerve decompression vs transposition left elbow   Family History:  Family History  Problem Relation Age of Onset   Diabetes Father    Diabetes Paternal Uncle    Colon cancer Neg Hx    Colon polyps Neg Hx    Esophageal cancer Neg Hx    Rectal cancer Neg Hx    Stomach cancer Neg Hx    Social History   Socioeconomic History   Marital status: Single    Spouse name: Not on file   Number of children: 0   Years of education: BA   Highest education level: Not on file  Occupational History   Occupation: Unemlpoyed-disabled  Tobacco Use   Smoking  status: Never   Smokeless tobacco: Never  Vaping Use   Vaping status: Never Used  Substance and Sexual Activity   Alcohol use: No   Drug use: No   Sexual activity: Yes  Other Topics Concern   Not on file  Social History Narrative   Lives with mother   Caffeine use: Coke very rare   Right handed    Social Drivers of Health   Financial Resource Strain: Low Risk  (10/03/2022)   Overall Financial Resource Strain (CARDIA)    Difficulty of Paying Living Expenses: Not hard at all  Food Insecurity: No Food Insecurity (10/03/2022)   Hunger Vital Sign    Worried About Running Out of Food in the Last Year: Never true    Ran Out of Food in the Last Year: Never true  Transportation Needs: No Transportation Needs (10/03/2022)   PRAPARE - Administrator, Civil Service (Medical): No    Lack of Transportation (Non-Medical): No  Physical Activity: Insufficiently Active (  10/03/2022)   Exercise Vital Sign    Days of Exercise per Week: 4 days    Minutes of Exercise per Session: 30 min  Stress: Stress Concern Present (10/03/2022)   [employer] of Occupational Health - Occupational Stress Questionnaire    Feeling of Stress : To some extent  Social Connections: Socially Isolated (09/21/2021)   Social Connection and Isolation Panel [NHANES]    Frequency of Communication with Friends and Family: Three times a week    Frequency of Social Gatherings with Friends and Family: Three times a week    Attends Religious Services: Never    Active Member of Clubs or Organizations: No    Attends Banker Meetings: Not on file    Marital Status: Never married    Allergies:  Allergies  Allergen Reactions   Cat Dander    Citric Acid Other (See Comments)    Runny nose, eyes water   Food     Oranges or anything with citric acid   Gabapentin      Walking into things, hard to maintain balance, falls    Current Medications: Current Outpatient Medications  Medication Sig Dispense Refill    ACCU-CHEK SOFTCLIX LANCETS lancets 3 (three) times daily. for testing  0   albuterol  (VENTOLIN  HFA) 108 (90 Base) MCG/ACT inhaler Inhale 1-2 puffs into the lungs every 6 (six) hours as needed for wheezing or shortness of breath. 18 g 0   amantadine  (SYMMETREL ) 100 MG capsule Take 1 capsule (100 mg total) by mouth 2 (two) times daily. 60 capsule 2   Blood Glucose Monitoring Suppl (ACCU-CHEK GUIDE ME) w/Device KIT USE AS DIRECTED 1 kit 0   Cariprazine  HCl (VRAYLAR ) 6 MG CAPS Take 1 capsule (6 mg total) by mouth daily. TAKE 1 CAPSULE(6 MG) BY MOUTH DAILY 30 capsule 2   chlorhexidine  (HIBICLENS ) 4 % external liquid Apply topically daily as needed. (Patient not taking: Reported on 11/20/2023) 118 mL 0   clonazePAM  (KLONOPIN ) 0.5 MG tablet Take 0.5 tablets (0.25 mg total) by mouth daily. 15 tablet 2   empagliflozin  (JARDIANCE ) 25 MG TABS tablet Take 1 tablet (25 mg total) by mouth daily. 90 tablet 2   fexofenadine (ALLEGRA ODT) 30 MG disintegrating tablet Take 30 mg by mouth daily.     Fluocinolone  Acetonide Body 0.01 % OIL Apply 1 Application topically as directed. Apply to ears daily for 2 weeks and then weekly as needed. 120 mL 2   fluPHENAZine  (PROLIXIN ) 5 MG tablet Take 1.5 tablets (7.5 mg total) by mouth at bedtime. 45 tablet 2   fluticasone  (FLONASE ) 50 MCG/ACT nasal spray SHAKE LIQUID AND USE 2 SPRAYS IN EACH NOSTRIL DAILY 16 g 6   glipiZIDE  (GLUCOTROL ) 10 MG tablet TAKE 1 TABLET(10 MG) BY MOUTH TWICE DAILY 360 tablet 3   glucose blood (ACCU-CHEK GUIDE) test strip 1 each by Other route in the morning, at noon, and at bedtime. Use to test glucose levels 3 times daily 200 strip 3   metFORMIN  (GLUCOPHAGE -XR) 500 MG 24 hr tablet TAKE 2 TABLETS(1000 MG) BY MOUTH TWICE DAILY 360 tablet 3   montelukast  (SINGULAIR ) 10 MG tablet TAKE 1 TABLET(10 MG) BY MOUTH AT BEDTIME 90 tablet 2   mupirocin  ointment (BACTROBAN ) 2 % Apply 1 Application topically daily. (Patient not taking: Reported on 11/20/2023) 22 g 0    omeprazole  (PRILOSEC) 20 MG capsule Take 1 capsule (20 mg total) by mouth daily. 30 capsule 3   Semaglutide ,0.25 or 0.5MG /DOS, (OZEMPIC , 0.25 OR 0.5 MG/DOSE,) 2  MG/3ML SOPN Inject 0.25 mg into the skin once a week. 3 mL 3   sertraline  (ZOLOFT ) 100 MG tablet Take 1.5 tablets (150 mg total) by mouth every morning. 45 tablet 2   simvastatin  (ZOCOR ) 20 MG tablet TAKE 1 TABLET(20 MG) BY MOUTH EVERY EVENING 90 tablet 3   tadalafil  (CIALIS ) 20 MG tablet Take 10-20 mg by mouth daily as needed.     traZODone  (DESYREL ) 50 MG tablet Take 1 tablet (50 mg total) by mouth at bedtime as needed for sleep. 30 tablet 2   TYRVAYA 0.03 MG/ACT SOLN Spray 1 spray in each nostril twice daily, approximately 12 hours apart (Patient not taking: Reported on 11/20/2023)     Current Facility-Administered Medications  Medication Dose Route Frequency Provider Last Rate Last Admin   methylPREDNISolone  acetate (DEPO-MEDROL ) injection 80 mg  80 mg Other Once Bridget Campion, MD       Objective: Psychiatric Specialty Exam: Weight 186 lb 9.6 oz (84.6 kg).Body mass index is 26.97 kg/m.  General Appearance: Casual, fairly groomed, pleasant, engaged  Eye Contact:  Good    Speech:  Clear, coherent, normal rate   Volume:  Normal   Mood:  see above  Affect:  Appropriate, congruent, blunted  Thought Content: Logical, rumination  Suicidal Thoughts: Denied active and passive SI    Thought Process:  Coherent, goal-directed, linear   Orientation:  A&Ox4   Memory:  Immediate good  Judgment:  Good   Insight:  None about the delusions  Concentration:  Attention and concentration good  Recall:  immediate good, recent good  Fund of Knowledge: Good  Language: Good, fluent  Psychomotor Activity: see above  Akathisia:  see above  AIMS (if indicated): see above  Assets:  Communication Skills Desire for Improvement Housing Leisure Time Physical Health Resilience Social  Support Talents/Skills Transportation Vocational/Educational  ADL's:  Intact  Cognition: WNL  Sleep:  see above    PE: General: well-appearing; no acute distress  Pulm: no increased work of breathing on room air  Strength & Muscle Tone: within normal limits Neuro: no focal neurological deficits observed  Gait & Station: normal  Metabolic Disorder Labs: Lab Results  Component Value Date   HGBA1C 7.6 (H) 07/18/2023   MPG 123 03/02/2018   No results found for: "PROLACTIN" Lab Results  Component Value Date   CHOL 115 11/09/2022   TRIG 60.0 11/09/2022   HDL 52.50 11/09/2022   CHOLHDL 2 11/09/2022   VLDL 12.0 11/09/2022   LDLCALC 50 11/09/2022   LDLCALC 58 07/26/2022   Lab Results  Component Value Date   TSH 1.02 07/08/2019   TSH 0.92 03/15/2018    Therapeutic Level Labs: No results found for: "LITHIUM" No results found for: "VALPROATE" No results found for: "CBMZ"  Screenings: GAD-7    Flowsheet Row Office Visit from 11/20/2023 in [facility] [employer] Visit from 07/26/2022 in [facility] [employer]  Total GAD-7 Score 5 5      PHQ2-9    Flowsheet Row Office Visit from 11/20/2023 in [facility] [employer] from 03/08/2023 in [facility] [place] from 12/29/2022 in [facility] Visit from 11/17/2022 in [facility] Visit from 11/09/2022 in [facility] Total Score 0 0 0 0 0  PHQ-9 Total Score 5 -- -- -- --  Flowsheet Row UC from 03/31/2023 in [facility] UC from 03/19/2023 in [facility] from 03/08/2023 in [facility] RISK CATEGORY No Risk No Risk No Risk        Patient/Guardian was advised Release of Information must be obtained prior to any record release in order to collaborate their care with an outside provider. Patient/Guardian was advised if they have not already done so to contact the registration department to sign all necessary forms in order for us  to release information regarding their care.   Consent: Patient/Guardian gives verbal consent for treatment and assignment of benefits for services provided during this visit. Patient/Guardian expressed understanding and agreed to proceed.   Genavie Boettger, DO Psych Resident, PGY-3

## 2024-02-05 NOTE — Addendum Note (Signed)
 Addended by: Donnelly Gainer on: 02/05/2024 03:24 PM   Modules accepted: Level of Service

## 2024-02-20 ENCOUNTER — Ambulatory Visit: Admitting: Family Medicine

## 2024-02-29 ENCOUNTER — Other Ambulatory Visit: Payer: Self-pay | Admitting: Family Medicine

## 2024-02-29 DIAGNOSIS — H5213 Myopia, bilateral: Secondary | ICD-10-CM | POA: Diagnosis not present

## 2024-02-29 DIAGNOSIS — H16223 Keratoconjunctivitis sicca, not specified as Sjogren's, bilateral: Secondary | ICD-10-CM | POA: Diagnosis not present

## 2024-02-29 DIAGNOSIS — E119 Type 2 diabetes mellitus without complications: Secondary | ICD-10-CM | POA: Diagnosis not present

## 2024-02-29 LAB — HM DIABETES EYE EXAM

## 2024-03-08 NOTE — Progress Notes (Unsigned)
 BH MD Outpatient Progress Note  03/11/2024 11:17 AM Beth FORBES Pinal  MRN:  [id number]  Assessment:  Jeb FORBES Pinal presents for follow-up evaluation. Today, 03/11/24, patient reports no recent increase in his paranoid thoughts or persecutory delusions. He reports that overall his mood has been stable since the last visit though he does note continued anxiety, decreased concentration, and continued restlessness. These do not appear to be new problems, rather he notes that these have been stable over the past years and he does not note any changes with his klonopin  taper. Per chart review, patient has been noting issues with concentration and restlessness for at least a year (noted in visit with resident provider 01/2023, Dr. Rudy 09/2022). Akathisia was not noted on exam today though he did have intermittent tremor of his R foot. We discussed continuing to taper off his klonopin  and to change the dosing to every other day. We discussed continuing his dual antipsychotic medication and antidepressant medication given his current stability. We discussed will continue to monitor for his restlessness, differential includes akathisia from his dual antipsychotic regimen vs. increased anxiety vs effect from his physical chronic back pain.   Identifying Information: Cashel RYLEY BACHTEL is a 58 y.o. male with a history of paranoid schizophrenia, MDD, tremors who is an established patient with [facility] for management of schizophrenia.  Risk Assessment: An assessment of suicide and violence risk factors was performed as part of this evaluation and is not  significantly changed from the last visit.             While future psychiatric events cannot be accurately predicted, the patient does not currently require acute inpatient psychiatric care and does not currently meet [facility]  involuntary commitment criteria.          Plan:  # Paranoid schizophrenia  Dual antipsychotics # Chronic  benzodiazepine use Past medication trials: rexulti (ineffective), latuda (ineffective), fanapt (ineffective), geodon (ineffective), abilify (tremor) Status of problem: ongoing Interventions: Therapy: referred  Antipsychotic labs - 06/2023, patient will obtain repeat from PCP in August  EKG - Qtc 420 - 09/07/2023 Continued home vraylar  6 mg daily Continued home prolixin  7.5 mg qHS DECREASE home klonopin  0.25 mg every other day  # Tremors vs parkinsonism Past medication trials: amantadine  Status of problem: ongoing Interventions: -- Continued home amantadine  100 mg BID   # MDD in remission  Past medication trials: wellbutrin Status of problem: resolved Interventions: Continued home zoloft  150 mg daily Continued home trazodone  50 mg at bedtime PRN  Health Maintenance PCP: Berneta Elsie Sayre, MD  DM2 - metformin  1000 mg BID, jardiance  25 mg, glipizide  10 mg BID Exercised induced asthma - singulair  10 mg daily, allegra daily, tyrvaya nasal spray BID HLD - simvastatin  20 mg daily ED - cialis  20 mg PRN  Return to care in  Future Appointments  Date Time Provider Department Center  03/19/2024  8:40 AM Berneta Elsie Sayre, MD [facility]  04/15/2024 10:30 AM Graham Krabbe, MD [facility] None  05/21/2024 10:45 AM Alm Delon SAILOR, DO [facility] None    Patient was given contact information for behavioral health clinic and was instructed to call 911 for emergencies.   Patient and plan of care will be discussed with the Attending MD ,Dr. Carvin, who agrees with the above statement and plan.   Subjective:  Chief Complaint: I've been even  Interval History:  -due for lipid panel, A1c - ordered from another provider and not yet collected  Labs 06/2023: BMP  wnl.  Lipid panel 10/2022: wnl.  Vitamin B12 06/2023 elevated.  A1c 06/2023 elevated.  TSH 06/2019 wnl  MRI brain 08/2018 no acute findings.  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 Reports difficulty with concentration and difficulty sitting still for the past 4 years. Denies any changes since last visit and no changes with decrease in klonopin . Discussed changing klonopin  to every other day and he was in agreement. Denies suicidal thoughts. Reports former neighbor used to stalk him, reports previously had people follow him. Reports in the past there have been people out to get him, reports it is about the same as last visit, thinks previously investigators out of [place] are going to torture information to get out of him. Denies currently. Reports decreased restlessness in his hands. Reports regarding his restlessness, can usually stop it. Reports only see stalkers when alone, reports last seen in February when he changed cars. Is still wanting to photograph birds in garden. His main concern is his restlessness, difficulty with concentrating, reports inner feeling of restlessness, reports also has back issues. Reports difficulty with sitting still. Reports some stable anxiety due to financial instability, mom's health, his own health, finding work. Reports having to make lifestyle changes, having to try to get into running.  Reports overall feeling better. Denies issues with sleep or appetite. Reports he is planning on getting labs from his PCP in August.   Visit Diagnosis:    ICD-10-CM   1. Paranoid schizophrenia, chronic condition (HCC)  F20.0 amantadine  (SYMMETREL ) 100 MG capsule    Cariprazine  HCl (VRAYLAR ) 6 MG CAPS    fluPHENAZine  (PROLIXIN ) 5 MG tablet    2. MDD (major  depressive disorder), recurrent, in full remission (HCC)  F33.42 Cariprazine  HCl (VRAYLAR ) 6 MG CAPS    sertraline  (ZOLOFT ) 100 MG tablet    3. Long term current use of antipsychotic medication  Z79.899 Cariprazine  HCl (VRAYLAR ) 6 MG CAPS    fluPHENAZine  (PROLIXIN ) 5 MG tablet    clonazePAM  (KLONOPIN ) 0.5 MG tablet      Past Psychiatric History:  Diagnoses: schizophrenia, MDD, schizoaffective disorder, depressed mood  Medication trials:   Current: amantadine  100 BID, vraylar  6mg  daily, klonopin  0.25 daily, prolixin  7.5mg  at bedtime, zoloft  150 daily. Also on simvastatin  and metformin .   Past: gabapentin  300 QID (falls, dizziness), lamictal  200, wellbutrin 300 daily (reports ineffective), rexulti (ineffective), latuda (ineffective), fanapt (ineffective), geodon (ineffective), abilify (tremor) Previous psychiatrist/therapist: Dr. Leontine Hospitalizations: none in chart review  Suicide attempts: denied SIB: reports biting fingernails when stressed  Hx of violence towards others: denied Current access to guns: denied Hx of trauma/abuse: denies  Substance use:   UDS - none prior  PDMP - klonopin  0.5mg  15 tabs 30 days filled 02/09/24 Denied other substance use  Denied  Past Medical History:  Past Medical History:  Diagnosis Date   Allergy    dog and cats, citrus   Anxiety    Asthma    Chronic pain    Depression    Diabetes mellitus    Glaucoma    Neuromuscular disorder (HCC)    Paranoid schizophrenia (HCC)  Past Surgical History:  Procedure Laterality Date   ANKLE ARTHROSCOPY     Arm Surgery  1/12   rt ulnar nerve decompression   EPIDURAL BLOCK INJECTION     several   ULNAR NERVE TRANSPOSITION  01/19/2012   Procedure: ULNAR NERVE DECOMPRESSION/TRANSPOSITION;  Surgeon: Lamar LULLA [place] Mickey., MD;  Location: [facility];  Service: Orthopedics;  Laterality: Left;  ulnar nerve decompression vs transposition left elbow   Family Psychiatric History:  Mother-  Depression and PTSD, on Abilify Paternal Aunt- Paranoia, hoarding  Family History:  Family History  Problem Relation Age of Onset   Diabetes Father    Diabetes Paternal Uncle    Colon cancer Neg Hx    Colon polyps Neg Hx    Esophageal cancer Neg Hx    Rectal cancer Neg Hx    Stomach cancer Neg Hx     Social History:  Academic/Vocational:  Housing: Living with mom Income: disability for mental health Denies children, unclear relationship  Substance Use History:   Social History   Socioeconomic History   Marital status: Single    Spouse name: Not on file   Number of children: 0   Years of education: BA   Highest education level: Not on file  Occupational History   Occupation: Unemlpoyed-disabled  Tobacco Use   Smoking status: Never   Smokeless tobacco: Never  Vaping Use   Vaping status: Never Used  Substance and Sexual Activity   Alcohol use: No   Drug use: No   Sexual activity: Yes  Other Topics Concern   Not on file  Social History Narrative   Lives with mother   Caffeine use: Coke very rare   Right handed    Social Drivers of Health   Financial Resource Strain: Low Risk  (10/03/2022)   Overall Financial Resource Strain (CARDIA)    Difficulty of Paying Living Expenses: Not hard at all  Food Insecurity: No Food Insecurity (10/03/2022)   Hunger Vital Sign    Worried About Running Out of Food in the Last Year: Never true    Ran Out of Food in the Last Year: Never true  Transportation Needs: No Transportation Needs (10/03/2022)   PRAPARE - Administrator, Civil Service (Medical): No    Lack of Transportation (Non-Medical): No  Physical Activity: Insufficiently Active (10/03/2022)   Exercise Vital Sign    Days of Exercise per Week: 4 days    Minutes of Exercise per Session: 30 min  Stress: Stress Concern Present (10/03/2022)   [employer] of Occupational Health - Occupational Stress Questionnaire    Feeling of Stress : To some extent  Social  Connections: Socially Isolated (09/21/2021)   Social Connection and Isolation Panel    Frequency of Communication with Friends and Family: Three times a week    Frequency of Social Gatherings with Friends and Family: Three times a week    Attends Religious Services: Never    Active Member of Clubs or Organizations: No    Attends Banker Meetings: Not on file    Marital Status: Never married    Allergies:  Allergies  Allergen Reactions   Cat Dander    Citric Acid Other (See Comments)    Runny nose, eyes water   Food     Oranges or anything with citric acid   Gabapentin      Walking into things, hard to maintain balance, falls    Current Medications: Current Outpatient Medications  Medication Sig  Dispense Refill   clonazePAM  (KLONOPIN ) 0.5 MG tablet Take 0.5 tablets (0.25 mg total) by mouth every other day. 9 tablet 0   ACCU-CHEK SOFTCLIX LANCETS lancets 3 (three) times daily. for testing  0   albuterol  (VENTOLIN  HFA) 108 (90 Base) MCG/ACT inhaler Inhale 1-2 puffs into the lungs every 6 (six) hours as needed for wheezing or shortness of breath. 18 g 0   amantadine  (SYMMETREL ) 100 MG capsule Take 1 capsule (100 mg total) by mouth 2 (two) times daily. 60 capsule 2   Blood Glucose Monitoring Suppl (ACCU-CHEK GUIDE ME) w/Device KIT USE AS DIRECTED 1 kit 0   Cariprazine  HCl (VRAYLAR ) 6 MG CAPS Take 1 capsule (6 mg total) by mouth daily. TAKE 1 CAPSULE(6 MG) BY MOUTH DAILY 30 capsule 2   chlorhexidine  (HIBICLENS ) 4 % external liquid Apply topically daily as needed. (Patient not taking: Reported on 11/20/2023) 118 mL 0   empagliflozin  (JARDIANCE ) 25 MG TABS tablet Take 1 tablet (25 mg total) by mouth daily. 90 tablet 2   fexofenadine (ALLEGRA ODT) 30 MG disintegrating tablet Take 30 mg by mouth daily.     Fluocinolone  Acetonide Body 0.01 % OIL Apply 1 Application topically as directed. Apply to ears daily for 2 weeks and then weekly as needed. 120 mL 2   fluPHENAZine  (PROLIXIN ) 5  MG tablet Take 1.5 tablets (7.5 mg total) by mouth at bedtime. 45 tablet 2   fluticasone  (FLONASE ) 50 MCG/ACT nasal spray SHAKE LIQUID AND USE 2 SPRAYS IN EACH NOSTRIL DAILY 16 g 6   glipiZIDE  (GLUCOTROL ) 10 MG tablet TAKE 1 TABLET(10 MG) BY MOUTH TWICE DAILY 360 tablet 3   glucose blood (ACCU-CHEK GUIDE) test strip 1 each by Other route in the morning, at noon, and at bedtime. Use to test glucose levels 3 times daily 200 strip 3   metFORMIN  (GLUCOPHAGE -XR) 500 MG 24 hr tablet TAKE 2 TABLETS(1000 MG) BY MOUTH TWICE DAILY 360 tablet 3   montelukast  (SINGULAIR ) 10 MG tablet TAKE 1 TABLET(10 MG) BY MOUTH AT BEDTIME 90 tablet 2   mupirocin  ointment (BACTROBAN ) 2 % Apply 1 Application topically daily. (Patient not taking: Reported on 11/20/2023) 22 g 0   omeprazole  (PRILOSEC) 20 MG capsule Take 1 capsule (20 mg total) by mouth daily. 30 capsule 3   Semaglutide ,0.25 or 0.5MG /DOS, (OZEMPIC , 0.25 OR 0.5 MG/DOSE,) 2 MG/3ML SOPN Inject 0.25 mg into the skin once a week. 3 mL 3   sertraline  (ZOLOFT ) 100 MG tablet Take 1.5 tablets (150 mg total) by mouth every morning. 45 tablet 2   simvastatin  (ZOCOR ) 20 MG tablet TAKE 1 TABLET(20 MG) BY MOUTH EVERY EVENING 90 tablet 3   tadalafil  (CIALIS ) 20 MG tablet TAKE ONE-HALF TO ONE TABLET BY MOUTH EVERY OTHER DAY AS NEEDED FOR ERECTILE DYSFUNCTION 10 tablet 2   traZODone  (DESYREL ) 50 MG tablet Take 1 tablet (50 mg total) by mouth at bedtime as needed for sleep. 30 tablet 2   TYRVAYA 0.03 MG/ACT SOLN Spray 1 spray in each nostril twice daily, approximately 12 hours apart (Patient not taking: Reported on 11/20/2023)     Current Facility-Administered Medications  Medication Dose Route Frequency Provider Last Rate Last Admin   methylPREDNISolone  acetate (DEPO-MEDROL ) injection 80 mg  80 mg Other Once Newton, Frederic, MD        ROS: Review of Systems  Constitutional: Negative.   Respiratory:  Negative for chest tightness and shortness of breath.   Cardiovascular:   Negative for chest pain.  Psychiatric/Behavioral:  Positive  for decreased concentration. Negative for suicidal ideas. The patient is nervous/anxious.     Objective:  Psychiatric Specialty Exam: Blood pressure 128/80, pulse 79, weight 189 lb (85.7 kg).Body mass index is 27.31 kg/m.  General Appearance: Casual, wearing blue Tshirt, had hiking shoes on  Eye Contact:  Fair  Speech:  Clear and Coherent  Volume:  Normal  Mood:  Anxious  Affect:  Appropriate  Thought Content: Logical   Suicidal Thoughts:  No  Homicidal Thoughts:  No  Thought Process:  Coherent  Orientation:  Full (Time, Place, and Person)    Memory: Grossly intact   Judgment:  Intact  Insight:  Fair  Concentration:  Concentration: Fair  Recall: not formally assessed   Fund of Knowledge: Fair  Language: Fair  Psychomotor Activity:  Increased and Restlessness in his R foot  Akathisia:  Not observed during visit   AIMS (if indicated): not done  Assets:  Communication Skills Desire for Improvement Social Support  ADL's:  Intact  Cognition: WNL  Sleep:  Fair   PE: General: well-appearing; no acute distress  Pulm: no increased work of breathing on room air  Strength & Muscle Tone: within normal limits Neuro: noted increased restlessness of R foot  Gait & Station: normal  Metabolic Disorder Labs: Lab Results  Component Value Date   HGBA1C 7.6 (H) 07/18/2023   MPG 123 03/02/2018   No results found for: PROLACTIN Lab Results  Component Value Date   CHOL 115 11/09/2022   TRIG 60.0 11/09/2022   HDL 52.50 11/09/2022   CHOLHDL 2 11/09/2022   VLDL 12.0 11/09/2022   LDLCALC 50 11/09/2022   LDLCALC 58 07/26/2022   Lab Results  Component Value Date   TSH 1.02 07/08/2019   TSH 0.92 03/15/2018    Therapeutic Level Labs: No results found for: LITHIUM No results found for: VALPROATE No results found for: CBMZ  Screenings:  GAD-7    Flowsheet Row Office Visit from 11/20/2023 in [facility]  [place] [facility] [employer] Visit from 07/26/2022 in [facility] [employer]  Total GAD-7 Score 5 5   PHQ2-9    Flowsheet Row Office Visit from 11/20/2023 in [facility] [employer] from 03/08/2023 in [facility] [place] from 12/29/2022 in [facility] Visit from 11/17/2022 in [facility] [place] [facility] [employer] Visit from 11/09/2022 in [facility] [employer]  PHQ-2 Total Score 0 0 0 0 0  PHQ-9 Total Score 5 -- -- -- --   Flowsheet Row UC from 03/31/2023 in [facility] UC from 03/19/2023 in [facility] from 03/08/2023 in [facility] RISK CATEGORY No Risk No Risk No Risk    Collaboration of Care: Collaboration of Care: Medication Management AEB Dr. Carvin  Patient/Guardian was advised Release of Information must be obtained prior to any record release in order to collaborate their care with an outside provider. Patient/Guardian was advised if they have not already done so to contact the registration department to sign all necessary forms in order for us  to release information regarding their care.   Consent: Patient/Guardian gives verbal consent for treatment and assignment of benefits for services provided during this visit. Patient/Guardian expressed understanding and agreed to proceed.   Corean Minor, MD, PGY-3 03/11/2024, 11:17 AM

## 2024-03-11 ENCOUNTER — Ambulatory Visit (HOSPITAL_BASED_OUTPATIENT_CLINIC_OR_DEPARTMENT_OTHER): Admitting: Psychiatry

## 2024-03-11 DIAGNOSIS — Z79899 Other long term (current) drug therapy: Secondary | ICD-10-CM | POA: Diagnosis not present

## 2024-03-11 DIAGNOSIS — F200 Paranoid schizophrenia: Secondary | ICD-10-CM

## 2024-03-11 DIAGNOSIS — F3342 Major depressive disorder, recurrent, in full remission: Secondary | ICD-10-CM | POA: Diagnosis not present

## 2024-03-11 MED ORDER — FLUPHENAZINE HCL 5 MG PO TABS: 7.5000 mg | ORAL_TABLET | Freq: Every day | ORAL | 2 refills | Status: DC

## 2024-03-11 MED ORDER — AMANTADINE HCL 100 MG PO CAPS: 100.0000 mg | ORAL_CAPSULE | Freq: Two times a day (BID) | ORAL | 2 refills | Status: DC

## 2024-03-11 MED ORDER — VRAYLAR 6 MG PO CAPS: 6.0000 mg | ORAL_CAPSULE | Freq: Every day | ORAL | 2 refills | Status: DC

## 2024-03-11 MED ORDER — SERTRALINE HCL 100 MG PO TABS
150.0000 mg | ORAL_TABLET | Freq: Every morning | ORAL | 2 refills | Status: DC
Start: 2024-03-11 — End: 2024-05-27

## 2024-03-11 MED ORDER — CLONAZEPAM 0.5 MG PO TABS: 0.2500 mg | ORAL_TABLET | ORAL | 0 refills | Status: DC

## 2024-03-12 NOTE — Addendum Note (Signed)
 Addended by: CARVIN CROCK on: 03/12/2024 09:14 AM   Modules accepted: Level of Service

## 2024-03-19 ENCOUNTER — Ambulatory Visit: Admitting: Family Medicine

## 2024-03-20 ENCOUNTER — Telehealth (HOSPITAL_COMMUNITY): Payer: Self-pay | Admitting: Psychiatry

## 2024-03-20 ENCOUNTER — Other Ambulatory Visit: Payer: Self-pay | Admitting: Family Medicine

## 2024-03-20 DIAGNOSIS — R052 Subacute cough: Secondary | ICD-10-CM

## 2024-03-20 NOTE — Telephone Encounter (Addendum)
 Received message from front desk that patient had concern about klonopin . I called patient at [phone]. Patient was asking about long-term risks of ongoing klonopin  use. We discussed these again. He reports he is concerned about his short-term issues with memory and his tremors and his anxiety. He denied increased paranoia. We discussed that he may have some increased anxiety as he is coming off the klonopin . He expressed understanding of this. He continued to express his desire to come off the klonopin  due to long-term risks of ongoing benzodiazepine use. We discussed plan from last visit to decrease his klonopin  to 0.25mg  every other day and follow-up visit date. He expressed understanding and appreciation for the call.   PDMP reviewed, patient last filled klonopin  0.5mg  15# for 30 days on 02/09/2024.

## 2024-04-04 ENCOUNTER — Ambulatory Visit: Admitting: Family Medicine

## 2024-04-04 ENCOUNTER — Encounter: Payer: Self-pay | Admitting: Family Medicine

## 2024-04-04 VITALS — BP 110/78 | HR 87 | Temp 98.5°F | Ht 69.0 in | Wt 181.8 lb

## 2024-04-04 DIAGNOSIS — G629 Polyneuropathy, unspecified: Secondary | ICD-10-CM | POA: Diagnosis not present

## 2024-04-04 DIAGNOSIS — E538 Deficiency of other specified B group vitamins: Secondary | ICD-10-CM

## 2024-04-04 DIAGNOSIS — R351 Nocturia: Secondary | ICD-10-CM

## 2024-04-04 DIAGNOSIS — E7800 Pure hypercholesterolemia, unspecified: Secondary | ICD-10-CM

## 2024-04-04 DIAGNOSIS — E119 Type 2 diabetes mellitus without complications: Secondary | ICD-10-CM

## 2024-04-04 DIAGNOSIS — Z13220 Encounter for screening for lipoid disorders: Secondary | ICD-10-CM

## 2024-04-04 DIAGNOSIS — Z23 Encounter for immunization: Secondary | ICD-10-CM

## 2024-04-04 DIAGNOSIS — M545 Low back pain, unspecified: Secondary | ICD-10-CM | POA: Diagnosis not present

## 2024-04-04 DIAGNOSIS — N401 Enlarged prostate with lower urinary tract symptoms: Secondary | ICD-10-CM | POA: Diagnosis not present

## 2024-04-04 DIAGNOSIS — G8929 Other chronic pain: Secondary | ICD-10-CM | POA: Diagnosis not present

## 2024-04-04 LAB — URINALYSIS, ROUTINE W REFLEX MICROSCOPIC
Bilirubin Urine: NEGATIVE
Hgb urine dipstick: NEGATIVE
Ketones, ur: 15 — AB
Leukocytes,Ua: NEGATIVE
Nitrite: NEGATIVE
RBC / HPF: NONE SEEN (ref 0–?)
Specific Gravity, Urine: 1.025 (ref 1.000–1.030)
Total Protein, Urine: NEGATIVE
Urine Glucose: >=1000 — AB
Urobilinogen, UA: 0.2 (ref 0.0–1.0)
pH: 6 (ref 5.0–8.0)

## 2024-04-04 LAB — LIPID PANEL
Cholesterol: 134 mg/dL (ref 0–200)
HDL: 60.2 mg/dL (ref >39.00)
LDL Cholesterol: 61 mg/dL (ref 0–99)
NonHDL: 74.2
Total CHOL/HDL Ratio: 2
Triglycerides: 64 mg/dL (ref 0.0–149.0)
VLDL: 12.8 mg/dL (ref 0.0–40.0)

## 2024-04-04 LAB — COMPREHENSIVE METABOLIC PANEL WITH GFR
ALT: 14 U/L (ref 0–53)
AST: 18 U/L (ref 0–37)
Albumin: 5 g/dL (ref 3.5–5.2)
Alkaline Phosphatase: 80 U/L (ref 39–117)
BUN: 12 mg/dL (ref 6–23)
CO2: 25 meq/L (ref 19–32)
Calcium: 9.7 mg/dL (ref 8.4–10.5)
Chloride: 102 meq/L (ref 96–112)
Creatinine, Ser: 0.89 mg/dL (ref 0.40–1.50)
GFR: 95.08 mL/min (ref >60.00)
Glucose, Bld: 97 mg/dL (ref 70–99)
Potassium: 3.6 meq/L (ref 3.5–5.1)
Sodium: 140 meq/L (ref 135–145)
Total Bilirubin: 0.9 mg/dL (ref 0.2–1.2)
Total Protein: 7.5 g/dL (ref 6.0–8.3)

## 2024-04-04 LAB — VITAMIN B12: Vitamin B-12: 1030 pg/mL — ABNORMAL HIGH (ref 211–911)

## 2024-04-04 LAB — CBC
HCT: 48 % (ref 39.0–52.0)
Hemoglobin: 15.3 g/dL (ref 13.0–17.0)
MCHC: 31.9 g/dL (ref 30.0–36.0)
MCV: 86.2 fl (ref 78.0–100.0)
Platelets: 206 K/uL (ref 150.0–400.0)
RBC: 5.57 Mil/uL (ref 4.22–5.81)
RDW: 12.9 % (ref 11.5–15.5)
WBC: 6.9 K/uL (ref 4.0–10.5)

## 2024-04-04 LAB — PSA: PSA: 0.4 ng/mL (ref 0.10–4.00)

## 2024-04-04 LAB — MICROALBUMIN / CREATININE URINE RATIO
Creatinine,U: 85.9 mg/dL
Microalb Creat Ratio: 14.6 mg/g (ref 0.0–30.0)
Microalb, Ur: 1.3 mg/dL (ref 0.0–1.9)

## 2024-04-04 LAB — HEMOGLOBIN A1C: Hgb A1c MFr Bld: 8.5 % — ABNORMAL HIGH (ref 4.6–6.5)
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
## 2024-04-05 ENCOUNTER — Ambulatory Visit: Payer: Self-pay | Admitting: Family Medicine
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
## 2024-04-12 ENCOUNTER — Other Ambulatory Visit: Payer: Self-pay | Admitting: Family Medicine

## 2024-04-15 ENCOUNTER — Encounter (HOSPITAL_COMMUNITY): Payer: Self-pay | Admitting: Psychiatry

## 2024-04-15 ENCOUNTER — Ambulatory Visit (HOSPITAL_BASED_OUTPATIENT_CLINIC_OR_DEPARTMENT_OTHER): Admitting: Psychiatry

## 2024-04-15 DIAGNOSIS — F3342 Major depressive disorder, recurrent, in full remission: Secondary | ICD-10-CM | POA: Diagnosis not present

## 2024-04-15 DIAGNOSIS — G4700 Insomnia, unspecified: Secondary | ICD-10-CM | POA: Diagnosis not present

## 2024-04-15 DIAGNOSIS — F200 Paranoid schizophrenia: Secondary | ICD-10-CM | POA: Diagnosis not present

## 2024-04-15 DIAGNOSIS — Z79899 Other long term (current) drug therapy: Secondary | ICD-10-CM

## 2024-04-15 MED ORDER — HYDROXYZINE HCL 10 MG PO TABS: 10.0000 mg | ORAL_TABLET | Freq: Every day | ORAL | 2 refills | Status: DC | PRN

## 2024-04-15 NOTE — Addendum Note (Signed)
 Addended by: CARVIN CROCK on: 04/15/2024 01:37 PM   Modules accepted: Level of Service

## 2024-04-18 ENCOUNTER — Ambulatory Visit: Admitting: Orthopedic Surgery

## 2024-04-18 ENCOUNTER — Other Ambulatory Visit (INDEPENDENT_AMBULATORY_CARE_PROVIDER_SITE_OTHER): Payer: Self-pay

## 2024-04-18 DIAGNOSIS — M542 Cervicalgia: Secondary | ICD-10-CM | POA: Diagnosis not present

## 2024-04-18 DIAGNOSIS — M545 Low back pain, unspecified: Secondary | ICD-10-CM | POA: Diagnosis not present
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
## 2024-05-01 ENCOUNTER — Ambulatory Visit: Attending: Family Medicine

## 2024-05-01 ENCOUNTER — Other Ambulatory Visit: Payer: Self-pay

## 2024-05-01 DIAGNOSIS — M545 Low back pain, unspecified: Secondary | ICD-10-CM | POA: Diagnosis not present

## 2024-05-01 DIAGNOSIS — M542 Cervicalgia: Secondary | ICD-10-CM | POA: Diagnosis not present

## 2024-05-01 DIAGNOSIS — M6281 Muscle weakness (generalized): Secondary | ICD-10-CM | POA: Insufficient documentation

## 2024-05-01 DIAGNOSIS — R293 Abnormal posture: Secondary | ICD-10-CM | POA: Insufficient documentation
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
## 2024-05-06 ENCOUNTER — Encounter: Payer: Self-pay | Admitting: Physical Therapy

## 2024-05-06 ENCOUNTER — Ambulatory Visit: Admitting: Physical Therapy

## 2024-05-06 DIAGNOSIS — M545 Low back pain, unspecified: Secondary | ICD-10-CM | POA: Diagnosis not present

## 2024-05-06 DIAGNOSIS — R293 Abnormal posture: Secondary | ICD-10-CM | POA: Diagnosis not present

## 2024-05-06 DIAGNOSIS — M542 Cervicalgia: Secondary | ICD-10-CM

## 2024-05-06 DIAGNOSIS — M6281 Muscle weakness (generalized): Secondary | ICD-10-CM | POA: Diagnosis not present
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
## 2024-05-07 ENCOUNTER — Other Ambulatory Visit: Payer: Self-pay

## 2024-05-08 DIAGNOSIS — Z23 Encounter for immunization: Secondary | ICD-10-CM | POA: Diagnosis not present

## 2024-05-15 ENCOUNTER — Ambulatory Visit

## 2024-05-20 ENCOUNTER — Ambulatory Visit: Admitting: Physical Therapy

## 2024-05-20 ENCOUNTER — Encounter: Payer: Self-pay | Admitting: Physical Therapy

## 2024-05-20 DIAGNOSIS — M6281 Muscle weakness (generalized): Secondary | ICD-10-CM

## 2024-05-20 DIAGNOSIS — M545 Low back pain, unspecified: Secondary | ICD-10-CM | POA: Diagnosis not present

## 2024-05-20 DIAGNOSIS — M542 Cervicalgia: Secondary | ICD-10-CM

## 2024-05-20 DIAGNOSIS — R293 Abnormal posture: Secondary | ICD-10-CM | POA: Diagnosis not present
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
## 2024-05-20 NOTE — Progress Notes (Signed)
 BH MD Outpatient Progress Note  05/27/2024 11:35 AM Glenn Robertson  MRN:  [id number]  Assessment:  Glenn Robertson presents for follow-up evaluation. Today, 05/27/24, patient reports increase in his paranoia due to being outside more and increase in his anxiety.  We discussed increasing his Prolixin . Aims 0 on exam today.  He denies any depressive symptoms or safety concerns.  No issues with Klonopin  taper. He was in agreement with this plan. Encouraged patient to establish with therapist.   Identifying Information: Glenn Robertson is a 58 y.o. male with a history of paranoid schizophrenia, MDD, tremors who is an established patient with [facility] for management of schizophrenia.  Risk Assessment: An assessment of suicide and violence risk factors was performed as part of this evaluation and is not  significantly changed from the last visit.             While future psychiatric events cannot be accurately predicted, the patient does not currently require acute inpatient psychiatric care and does not currently meet [place]  involuntary commitment criteria.          Plan:  # Paranoid schizophrenia  Dual antipsychotics Past medication trials: rexulti (ineffective), latuda (ineffective), fanapt (ineffective), geodon (ineffective), abilify (tremor), klonopin  Status of problem: ongoing Interventions: Therapy: referred, given another list  Antipsychotic labs: 03/2024 A1c 8.5, Lipid panel wnl  EKG - Qtc 420 - 09/07/2023 Continued vraylar  6 mg daily Increase prolixin  10 mg qHS Continue hydroxyzine  10mg  daily PRN for increased anxiety Provide information for [facility] for ADHD and autism testing  # Tremors vs parkinsonism Past medication trials: amantadine  Status of problem: ongoing Interventions: -- Continued home amantadine  100 mg BID   # MDD in remission  Past medication trials: wellbutrin Status of problem:  resolved Interventions: Continued home zoloft  150 mg daily Continued home trazodone  50 mg at bedtime PRN  Health Maintenance PCP: Berneta Elsie Sayre, MD  DM2 - metformin  1000 mg BID, jardiance  25 mg, glipizide  10 mg BID Exercised induced asthma - singulair  10 mg daily, allegra daily, tyrvaya nasal spray BID HLD - simvastatin  20 mg daily ED - cialis  20 mg PRN  Return to care in  Future Appointments  Date Time Provider Department Center  06/03/2024  9:15 AM Zenaida Reyes HERO, PT [facility]  06/10/2024  9:15 AM Abran Mabel SAUNDERS, PT [facility]  06/25/2024 11:30 AM Zenaida Reyes HERO, PT [facility]  07/01/2024 10:30 AM Graham Krabbe, MD [facility] None    Patient was given contact information for behavioral health clinic and was instructed to call 911 for emergencies.   Patient and plan of care will be discussed with the Attending MD ,Dr. Carvin, who agrees with the above statement and plan.   Subjective:  Chief Complaint: I'm even  Interval History:  --Seen for neck and lumbar pain, referred for PT and continues with PT --Klonopin  last filled 03/11/24  Patient is seen in the office today.  He reports that someone is playing games with me.  He reports that 2-3 times this month he is seeing the people who have been stalking him for ever.  He reports that this goes back to 1992 and its sometimes 1 person sometimes 2.  He reports that they are in a car with blacked out windows.  He does not know why they are stalking him.  He thinks that it may be due to some type of investigation.  He otherwise reports that his mood is mostly anxious possibly  due to seeing these people more.  He reports that he has also been going outside more which could be contributing to him seeing the people who have been stalking him more.  He denies issues with his sleep or appetite.  He denies depressive symptoms.  He reports stressors of worry about his parents health and lack of social help.  He  reports increased anxiety. He thinks that he may have autism or ADHD.  He reports that he is still taking care of himself.  He reports that he has been taking the hydroxyzine  that he thinks has been helpful.  He has taken it 3-4 times this month.  He denies any substance use.  He denies any SI/HI/AVH.   Visit Diagnosis:    ICD-10-CM   1. Paranoid schizophrenia, chronic condition (HCC)  F20.0 Cariprazine  HCl (VRAYLAR ) 6 MG CAPS    amantadine  (SYMMETREL ) 100 MG capsule    fluPHENAZine  (PROLIXIN ) 10 MG tablet    2. MDD (major depressive disorder), recurrent, in full remission  F33.42 Cariprazine  HCl (VRAYLAR ) 6 MG CAPS    sertraline  (ZOLOFT ) 100 MG tablet    traZODone  (DESYREL ) 50 MG tablet    3. Long term current use of antipsychotic medication  Z79.899 Cariprazine  HCl (VRAYLAR ) 6 MG CAPS    4. Insomnia, unspecified type  G47.00 traZODone  (DESYREL ) 50 MG tablet       Past Psychiatric History:  Diagnoses: schizophrenia, MDD, schizoaffective disorder, depressed mood  Medication trials:   Current: amantadine  100 BID, vraylar  6mg  daily, klonopin  0.25 daily, prolixin  7.5mg  at bedtime, zoloft  150 daily. Also on simvastatin  and metformin .   Past: gabapentin  300 QID (falls, dizziness), lamictal  200, wellbutrin 300 daily (reports ineffective), rexulti (ineffective), latuda (ineffective), fanapt (ineffective), geodon (ineffective), abilify (tremor) Previous psychiatrist/therapist: Dr. Leontine Hospitalizations: none in chart review  Suicide attempts: denied SIB: reports biting fingernails when stressed  Hx of violence towards others: denied Current access to guns: denied Hx of trauma/abuse: denies  Substance use:   UDS - none prior  PDMP - klonopin  0.5mg  15 tabs 30 days filled 02/09/24 Denied other substance use  Denied  Past Medical History:  Past Medical History:  Diagnosis Date   Allergy    dog and cats, citrus   Anxiety    Asthma    Chronic pain    Depression    Diabetes mellitus     Glaucoma    Neuromuscular disorder (HCC)    Paranoid schizophrenia (HCC)     Past Surgical History:  Procedure Laterality Date   ANKLE ARTHROSCOPY     Arm Surgery  1/12   rt ulnar nerve decompression   EPIDURAL BLOCK INJECTION     several   ULNAR NERVE TRANSPOSITION  01/19/2012   Procedure: ULNAR NERVE DECOMPRESSION/TRANSPOSITION;  Surgeon: Lamar LULLA Leonor Mickey., MD;  Location: [facility];  Service: Orthopedics;  Laterality: Left;  ulnar nerve decompression vs transposition left elbow   Family Psychiatric History:  Mother- Depression and PTSD, on Abilify Paternal Aunt- Paranoia, hoarding  Family History:  Family History  Problem Relation Age of Onset   Diabetes Father    Diabetes Paternal Uncle    Colon cancer Neg Hx    Colon polyps Neg Hx    Esophageal cancer Neg Hx    Rectal cancer Neg Hx    Stomach cancer Neg Hx     Social History:  Academic/Vocational:  Housing: Living with mom Income: disability for mental health Denies children, unclear relationship  Substance Use History:  Social History   Socioeconomic History   Marital status: Single    Spouse name: Not on file   Number of children: 0   Years of education: BA   Highest education level: Not on file  Occupational History   Occupation: Unemlpoyed-disabled  Tobacco Use   Smoking status: Never   Smokeless tobacco: Never  Vaping Use   Vaping status: Never Used  Substance and Sexual Activity   Alcohol use: No   Drug use: No   Sexual activity: Yes  Other Topics Concern   Not on file  Social History Narrative   Lives with mother   Caffeine use: Coke very rare   Right handed    Social Drivers of Health   Financial Resource Strain: Low Risk  (10/03/2022)   Overall Financial Resource Strain (CARDIA)    Difficulty of Paying Living Expenses: Not hard at all  Food Insecurity: No Food Insecurity (10/03/2022)   Hunger Vital Sign    Worried About Running Out of Food in the Last Year: Never  true    Ran Out of Food in the Last Year: Never true  Transportation Needs: No Transportation Needs (10/03/2022)   PRAPARE - Administrator, Civil Service (Medical): No    Lack of Transportation (Non-Medical): No  Physical Activity: Insufficiently Active (10/03/2022)   Exercise Vital Sign    Days of Exercise per Week: 4 days    Minutes of Exercise per Session: 30 min  Stress: Stress Concern Present (10/03/2022)   [employer] of Occupational Health - Occupational Stress Questionnaire    Feeling of Stress : To some extent  Social Connections: Socially Isolated (09/21/2021)   Social Connection and Isolation Panel    Frequency of Communication with Friends and Family: Three times a week    Frequency of Social Gatherings with Friends and Family: Three times a week    Attends Religious Services: Never    Active Member of Clubs or Organizations: No    Attends Banker Meetings: Not on file    Marital Status: Never married    Allergies:  Allergies  Allergen Reactions   Cat Dander    Citric Acid Other (See Comments)    Runny nose, eyes water   Food     Oranges or anything with citric acid   Gabapentin      Walking into things, hard to maintain balance, falls    Current Medications: Current Outpatient Medications  Medication Sig Dispense Refill   fluPHENAZine  (PROLIXIN ) 10 MG tablet Take 1 tablet (10 mg total) by mouth at bedtime. 90 tablet 0   ACCU-CHEK SOFTCLIX LANCETS lancets 3 (three) times daily. for testing  0   albuterol  (VENTOLIN  HFA) 108 (90 Base) MCG/ACT inhaler Inhale 1-2 puffs into the lungs every 6 (six) hours as needed for wheezing or shortness of breath. 18 g 0   amantadine  (SYMMETREL ) 100 MG capsule Take 1 capsule (100 mg total) by mouth 2 (two) times daily. 180 capsule 0   Blood Glucose Monitoring Suppl (ACCU-CHEK GUIDE ME) w/Device KIT USE AS DIRECTED 1 kit 0   [START ON 06/09/2024] Cariprazine  HCl (VRAYLAR ) 6 MG CAPS Take 1 capsule (6 mg total)  by mouth daily. TAKE 1 CAPSULE(6 MG) BY MOUTH DAILY 90 capsule 0   chlorhexidine  (HIBICLENS ) 4 % external liquid Apply topically daily as needed. (Patient not taking: Reported on 04/04/2024) 118 mL 0   empagliflozin  (JARDIANCE ) 25 MG TABS tablet Take 1 tablet (25 mg total) by mouth daily. 90  tablet 2   fexofenadine (ALLEGRA ODT) 30 MG disintegrating tablet Take 30 mg by mouth daily.     fluticasone  (FLONASE ) 50 MCG/ACT nasal spray SHAKE LIQUID AND USE 2 SPRAYS IN EACH NOSTRIL DAILY 16 g 6   glipiZIDE  (GLUCOTROL ) 10 MG tablet TAKE 1 TABLET(10 MG) BY MOUTH TWICE DAILY 360 tablet 3   glucose blood (ACCU-CHEK GUIDE) test strip 1 each by Other route in the morning, at noon, and at bedtime. Use to test glucose levels 3 times daily 200 strip 3   hydrOXYzine  (ATARAX ) 10 MG tablet Take 1 tablet (10 mg total) by mouth daily as needed. 30 tablet 2   metFORMIN  (GLUCOPHAGE -XR) 500 MG 24 hr tablet TAKE 2 TABLETS(1000 MG) BY MOUTH TWICE DAILY 360 tablet 3   montelukast  (SINGULAIR ) 10 MG tablet TAKE 1 TABLET(10 MG) BY MOUTH AT BEDTIME 90 tablet 2   mupirocin  ointment (BACTROBAN ) 2 % Apply 1 Application topically daily. 22 g 0   omeprazole  (PRILOSEC) 20 MG capsule TAKE 1 CAPSULE(20 MG) BY MOUTH DAILY 90 capsule 0   sertraline  (ZOLOFT ) 100 MG tablet Take 1.5 tablets (150 mg total) by mouth every morning. 135 tablet 0   simvastatin  (ZOCOR ) 20 MG tablet TAKE 1 TABLET(20 MG) BY MOUTH EVERY EVENING 90 tablet 3   tadalafil  (CIALIS ) 20 MG tablet TAKE ONE-HALF TO ONE TABLET BY MOUTH EVERY OTHER DAY AS NEEDED FOR ERECTILE DYSFUNCTION 10 tablet 2   traZODone  (DESYREL ) 50 MG tablet Take 1 tablet (50 mg total) by mouth at bedtime as needed for sleep. 30 tablet 2   TYRVAYA 0.03 MG/ACT SOLN Spray 1 spray in each nostril twice daily, approximately 12 hours apart     Current Facility-Administered Medications  Medication Dose Route Frequency Provider Last Rate Last Admin   methylPREDNISolone  acetate (DEPO-MEDROL ) injection 80 mg   80 mg Other Once Newton, Frederic, MD        ROS: Review of Systems  Constitutional: Negative.   Respiratory:  Negative for chest tightness and shortness of breath.   Cardiovascular:  Negative for chest pain.  Psychiatric/Behavioral:  Positive for decreased concentration. Negative for suicidal ideas. The patient is nervous/anxious.    Objective:  Psychiatric Specialty Exam: There were no vitals taken for this visit.There is no height or weight on file to calculate BMI.  General Appearance: Casual  Eye Contact:  Fair  Speech:  Clear and Coherent  Volume:  Normal  Mood:  Anxious  Affect:  Appropriate  Thought Content: Paranoid Ideation   Suicidal Thoughts:  No  Homicidal Thoughts:  No  Thought Process:  Coherent  Orientation:  Full (Time, Place, and Person)    Memory: Grossly intact   Judgment:  Intact  Insight:  Fair  Concentration:  Concentration: Fair  Recall: not formally assessed   Fund of Knowledge: Fair  Language: Fair  Psychomotor Activity:  Normal   Akathisia:  Not observed during visit   AIMS (if indicated): 0 on 05/27/24  Assets:  Communication Skills Desire for Improvement Social Support  ADL's:  Intact  Cognition: WNL  Sleep:  Fair   PE: General: well-appearing; no acute distress  Pulm: no increased work of breathing on room air  Strength & Muscle Tone: within normal limits Neuro: no focal deficits noted Gait & Station: normal  Metabolic Disorder Labs: Lab Results  Component Value Date   HGBA1C 8.5 (H) 04/04/2024   MPG 123 03/02/2018   No results found for: PROLACTIN Lab Results  Component Value Date   CHOL 134  04/04/2024   TRIG 64.0 04/04/2024   HDL 60.20 04/04/2024   CHOLHDL 2 04/04/2024   VLDL 12.8 04/04/2024   LDLCALC 61 04/04/2024   LDLCALC 50 11/09/2022   Lab Results  Component Value Date   TSH 1.02 07/08/2019   TSH 0.92 03/15/2018    Therapeutic Level Labs: No results found for: LITHIUM No results found for:  VALPROATE No results found for: CBMZ  Screenings:  GAD-7    Flowsheet Row Office Visit from 11/20/2023 in [facility] [place] [place] [facility] Visit from 07/26/2022 in [facility] [employer]  Total GAD-7 Score 5 5   PHQ2-9    Flowsheet Row Office Visit from 11/20/2023 in [facility] [employer] from 03/08/2023 in [facility] from 12/29/2022 in [facility] Visit from 11/17/2022 in [facility] [employer] Visit from 11/09/2022 in [facility] [employer]  PHQ-2 Total Score 0 0 0 0 0  PHQ-9 Total Score 5 -- -- -- --   Flowsheet Row UC from 03/31/2023 in [facility] UC from 03/19/2023 in [facility] from 03/08/2023 in [facility] at [facility] RISK CATEGORY No Risk No Risk No Risk    Collaboration of Care: Collaboration of Care: Medication Management AEB attending MD  Patient/Guardian was advised Release of Information must be obtained prior to any record release in order to collaborate their care with an outside provider. Patient/Guardian was advised if they have not already done so to contact the registration department to sign all necessary forms in order for us  to release information regarding their care.   Consent: Patient/Guardian gives verbal consent for treatment and assignment of benefits for services provided during this visit. Patient/Guardian expressed understanding and agreed to proceed.   Corean Minor, MD, PGY-3 05/27/2024, 11:35 AM

## 2024-05-21 ENCOUNTER — Ambulatory Visit: Admitting: Dermatology

## 2024-05-27 ENCOUNTER — Ambulatory Visit (HOSPITAL_COMMUNITY): Admitting: Psychiatry

## 2024-05-27 ENCOUNTER — Encounter: Admitting: Physical Therapy

## 2024-05-27 DIAGNOSIS — Z79899 Other long term (current) drug therapy: Secondary | ICD-10-CM

## 2024-05-27 DIAGNOSIS — F200 Paranoid schizophrenia: Secondary | ICD-10-CM | POA: Diagnosis not present

## 2024-05-27 DIAGNOSIS — G4700 Insomnia, unspecified: Secondary | ICD-10-CM | POA: Diagnosis not present

## 2024-05-27 DIAGNOSIS — F3342 Major depressive disorder, recurrent, in full remission: Secondary | ICD-10-CM | POA: Diagnosis not present

## 2024-05-27 MED ORDER — TRAZODONE HCL 50 MG PO TABS: 50.0000 mg | ORAL_TABLET | Freq: Every evening | ORAL | 2 refills | Status: DC | PRN

## 2024-05-27 MED ORDER — AMANTADINE HCL 100 MG PO CAPS: 100.0000 mg | ORAL_CAPSULE | Freq: Two times a day (BID) | ORAL | 0 refills | Status: DC

## 2024-05-27 MED ORDER — VRAYLAR 6 MG PO CAPS: 6.0000 mg | ORAL_CAPSULE | Freq: Every day | ORAL | 0 refills | Status: DC

## 2024-05-27 MED ORDER — SERTRALINE HCL 100 MG PO TABS: 150.0000 mg | ORAL_TABLET | Freq: Every morning | ORAL | 0 refills | Status: DC

## 2024-05-27 MED ORDER — FLUPHENAZINE HCL 10 MG PO TABS: 10.0000 mg | ORAL_TABLET | Freq: Every day | ORAL | 0 refills | Status: DC

## 2024-05-27 NOTE — Addendum Note (Signed)
 Addended by: CARVIN CROCK on: 05/27/2024 03:59 PM   Modules accepted: Level of Service
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
## 2024-06-03 ENCOUNTER — Ambulatory Visit: Attending: Orthopedic Surgery

## 2024-06-03 DIAGNOSIS — M545 Low back pain, unspecified: Secondary | ICD-10-CM | POA: Diagnosis not present

## 2024-06-03 DIAGNOSIS — M6281 Muscle weakness (generalized): Secondary | ICD-10-CM | POA: Diagnosis not present

## 2024-06-03 DIAGNOSIS — M542 Cervicalgia: Secondary | ICD-10-CM | POA: Diagnosis not present

## 2024-06-03 DIAGNOSIS — R293 Abnormal posture: Secondary | ICD-10-CM | POA: Insufficient documentation

## 2024-06-06 ENCOUNTER — Ambulatory Visit (INDEPENDENT_AMBULATORY_CARE_PROVIDER_SITE_OTHER): Admitting: Podiatry

## 2024-06-06 DIAGNOSIS — E0842 Diabetes mellitus due to underlying condition with diabetic polyneuropathy: Secondary | ICD-10-CM

## 2024-06-06 DIAGNOSIS — G5793 Unspecified mononeuropathy of bilateral lower limbs: Secondary | ICD-10-CM

## 2024-06-06 NOTE — Progress Notes (Signed)
 Presents today for a diabetic foot check.  His A1c was little elevated on his last visit.  Complains of some numbness and tingling in the feet at times.  Also has some back issues with discs.  Has not noticed any more ulcerative areas or preulcerative areas on the foot.   Physical exam:  General appearance: Pleasant, and in no acute distress. AOx3.  Vascular: Pedal pulses: DP 2/4 bilaterally, PT 2/4 bilaterally.  Minimal edema lower legs bilaterally. Capillary fill time immediate.  Neurological: Light touch intact feet bilaterally.  Normal Achilles reflex bilaterally.  No clonus or spasticity noted.  Monofilament filament sensation intact feet bilaterally. slight decrease in vibratory sensation feet bilaterally  Dermatologic:   Skin normal temperature bilaterally.  Skin normal color, tone, and texture bilaterally.  Previous area of ulceration great toe right is completely healed with no signs of preulcerative changes.  Musculoskeletal: Hammertoe deformities 2 through 5 bilaterally.  Normal muscle strength lower extremity bilaterally.  Hallux valgus deformity bilaterally   Diagnosis: 1.  Neuritis feet bilaterally  Plan: -Established office visit for evaluation and management level 3. - Discussed with him the neuropathy in the feet.  Some of this is probably diabetic neuropathy and so it is probably coming from the back.  Difficult to differentiate which she is with.  At this point I do not think he needs any pharmaceutical intervention.  Explained that if it does become worse or he starts getting a great deal of discomfort we can try that.  Discussed precautions to take as a diabetic regarding his feet and preventing infections and ulcerations.  Return as needed

## 2024-06-07 DIAGNOSIS — Z23 Encounter for immunization: Secondary | ICD-10-CM | POA: Diagnosis not present

## 2024-06-10 ENCOUNTER — Encounter: Payer: Self-pay | Admitting: Physical Therapy

## 2024-06-10 ENCOUNTER — Ambulatory Visit: Admitting: Physical Therapy

## 2024-06-10 DIAGNOSIS — M6281 Muscle weakness (generalized): Secondary | ICD-10-CM

## 2024-06-10 DIAGNOSIS — M542 Cervicalgia: Secondary | ICD-10-CM

## 2024-06-10 DIAGNOSIS — M545 Low back pain, unspecified: Secondary | ICD-10-CM | POA: Diagnosis not present

## 2024-06-10 DIAGNOSIS — R293 Abnormal posture: Secondary | ICD-10-CM
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
## 2024-06-13 ENCOUNTER — Other Ambulatory Visit: Payer: Self-pay | Admitting: Family Medicine

## 2024-06-13 DIAGNOSIS — E1165 Type 2 diabetes mellitus with hyperglycemia: Secondary | ICD-10-CM

## 2024-06-17 ENCOUNTER — Ambulatory Visit: Admitting: Orthopedic Surgery

## 2024-06-17 DIAGNOSIS — M545 Low back pain, unspecified: Secondary | ICD-10-CM | POA: Diagnosis not present
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
## 2024-06-18 ENCOUNTER — Other Ambulatory Visit: Payer: Self-pay | Admitting: Family Medicine

## 2024-06-18 ENCOUNTER — Telehealth: Payer: Self-pay

## 2024-06-18 DIAGNOSIS — R052 Subacute cough: Secondary | ICD-10-CM

## 2024-06-18 NOTE — Telephone Encounter (Signed)
 Patient was identified as falling into the [place] Measure - Diabetes.   Patient was: Appointment scheduled with primary care provider in the next 30 days.  OV with PCP 07/11/24.

## 2024-06-19 DIAGNOSIS — F200 Paranoid schizophrenia: Secondary | ICD-10-CM | POA: Diagnosis not present
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
## 2024-06-25 ENCOUNTER — Ambulatory Visit
Admission: RE | Admit: 2024-06-25 | Discharge: 2024-06-25 | Disposition: A | Discharge status: Home / Self Care | Source: Ambulatory Visit | Attending: Orthopedic Surgery | Admitting: Orthopedic Surgery

## 2024-06-25 ENCOUNTER — Ambulatory Visit

## 2024-06-25 DIAGNOSIS — M47817 Spondylosis without myelopathy or radiculopathy, lumbosacral region: Secondary | ICD-10-CM | POA: Diagnosis not present

## 2024-06-25 DIAGNOSIS — M6281 Muscle weakness (generalized): Secondary | ICD-10-CM | POA: Diagnosis not present

## 2024-06-25 DIAGNOSIS — M542 Cervicalgia: Secondary | ICD-10-CM | POA: Diagnosis not present

## 2024-06-25 DIAGNOSIS — M545 Low back pain, unspecified: Secondary | ICD-10-CM

## 2024-06-25 DIAGNOSIS — R293 Abnormal posture: Secondary | ICD-10-CM | POA: Diagnosis not present

## 2024-06-25 DIAGNOSIS — M5127 Other intervertebral disc displacement, lumbosacral region: Secondary | ICD-10-CM | POA: Diagnosis not present

## 2024-06-28 NOTE — Progress Notes (Addendum)
 BH MD Outpatient Progress Note  07/01/2024 11:06 AM Bluford ALEKSI BRUMMET  MRN:  [id number]  Assessment:  Jered FORBES Pinal presents for follow-up evaluation. Today, 07/01/24, patient reports still some continued paranoia though this has improved since the last visit. We discussed continuing current medication regimen. He denies any depressive symptoms or safety concerns. He continues to report subjective restlessness that has been stable since 2021 and continues to report decreased concentration. He has follow-up with [facility] in December for neuropsychological testing. Shared decision making for no medication changes at this time.   Identifying Information: Dekari GIANN OBARA is a 58 y.o. male with a history of paranoid schizophrenia, MDD, tremors who is an established patient with [facility] for management of schizophrenia.  Risk Assessment: An assessment of suicide and violence risk factors was performed as part of this evaluation and is not  significantly changed from the last visit.             While future psychiatric events cannot be accurately predicted, the patient does not currently require acute inpatient psychiatric care and does not currently meet [facility]  involuntary commitment criteria.          Plan:  # Paranoid schizophrenia  Dual antipsychotics Past medication trials: rexulti (ineffective), latuda (ineffective), fanapt (ineffective), geodon (ineffective), abilify (tremor), klonopin  Status of problem: ongoing Interventions: Therapy: referred, given another list  Antipsychotic labs: 03/2024 A1c 8.5, Lipid panel wnl  EKG - Qtc 420 - 09/07/2023 Continued vraylar  6 mg daily Continue prolixin  10 mg qHS Continue hydroxyzine  10mg  daily PRN for increased anxiety Sent referral for [facility] for ADHD and autism testing  # Tremors vs parkinsonism Past medication trials: amantadine  Status of problem: ongoing Interventions: -- Continued home  amantadine  100 mg BID   # MDD in remission  Past medication trials: wellbutrin Status of problem: resolved Interventions: Continued home zoloft  150 mg daily Continued home trazodone  50 mg at bedtime PRN  Health Maintenance PCP: Berneta Elsie Sayre, MD  DM2 - metformin  1000 mg BID, jardiance  25 mg, glipizide  10 mg BID Exercised induced asthma - singulair  10 mg daily, allegra daily, tyrvaya nasal spray BID HLD - simvastatin  20 mg daily ED - cialis  20 mg PRN  Return to care in  Future Appointments  Date Time Provider Department Center  07/11/2024  1:20 PM Berneta Elsie Sayre, MD [facility] [place]  07/15/2024 10:30 AM Georgina Ozell LABOR, MD [facility] None    Patient was given contact information for behavioral health clinic and was instructed to call 911 for emergencies.   Patient and plan of care will be discussed with the Attending MD who agrees with the above statement and plan.   Subjective:  Chief Complaint: I'm even  Interval History:  --last visit: increased prolixin  due to increased paranoia --saw PT for neck and lumbar pain --saw podiatrist, dx with feet neuropathy, suspected probably some diabetic neuropathy and some from the back --saw ortho 2/2 ongoing pain in neck and lumbar back - recommended MRI of lumbar spine and conservative treatment (PT, tylenol , heat)  Patient reports that he went to [facility] last week for neuropsych testing, reports that she asked him questions from a list. States that he has a follow-up appointment in December. Patient reports mood is alright. Reports he has not seeing the people who have been stalking him for the last month, reports no suspicious people recently. Reports that he is not running as much due to his back issues. Patient reports improved sleep, around  10-11 hours of sleep. Reports he will take the trazodone  to help but sometimes it doesn't work as well. Patient reports fair appetite, reports that he is trying to reduce his  weight. Reports that he will have impulse buys with food. Patient reports stressors include sitting for his nephew's viola performance. He has been going to the movies, his nephew's viola performance. Patient reports mostly adherence with medications, may miss once or twice a month. Patient reports no side effects. Patient reports no substance use. Patient denies SI/HI/AVH. He takes the hydroxyzine  before going running or going someplace, reports taking it 2-3 times a week. Reports that he can have increased pacing once or twice a day, does not note a particular time of day. Reports noticed continued increased restlessness since 2021.  Visit Diagnosis:    ICD-10-CM   1. Paranoid schizophrenia, chronic condition (HCC)  F20.0 fluPHENAZine  (PROLIXIN ) 10 MG tablet    Cariprazine  HCl (VRAYLAR ) 6 MG CAPS    amantadine  (SYMMETREL ) 100 MG capsule    2. MDD (major depressive disorder), recurrent, in full remission  F33.42 Cariprazine  HCl (VRAYLAR ) 6 MG CAPS    hydrOXYzine  (ATARAX ) 10 MG tablet    traZODone  (DESYREL ) 50 MG tablet    3. Long term current use of antipsychotic medication  Z79.899 Cariprazine  HCl (VRAYLAR ) 6 MG CAPS    4. Insomnia, unspecified type  G47.00 traZODone  (DESYREL ) 50 MG tablet      Past Psychiatric History:  Diagnoses: schizophrenia, MDD, schizoaffective disorder, depressed mood  Medication trials:   Current: amantadine  100 BID, vraylar  6mg  daily, klonopin  0.25 daily, prolixin  7.5mg  at bedtime, zoloft  150 daily. Also on simvastatin  and metformin .   Past: gabapentin  300 QID (falls, dizziness), lamictal  200, wellbutrin 300 daily (reports ineffective), rexulti (ineffective), latuda (ineffective), fanapt (ineffective), geodon (ineffective), abilify (tremor) Previous psychiatrist/therapist: Dr. Leontine Hospitalizations: none in chart review  Suicide attempts: denied SIB: reports biting fingernails when stressed  Hx of violence towards others: denied Current access to guns:  denied Hx of trauma/abuse: denies  Substance use:   UDS - none prior  PDMP - klonopin  0.5mg  15 tabs 30 days filled 02/09/24 Denied other substance use  Denied  Past Medical History:  Past Medical History:  Diagnosis Date   Allergy    dog and cats, citrus   Anxiety    Asthma    Chronic pain    Depression    Diabetes mellitus    Glaucoma    Neuromuscular disorder (HCC)    Paranoid schizophrenia (HCC)     Past Surgical History:  Procedure Laterality Date   ANKLE ARTHROSCOPY     Arm Surgery  1/12   rt ulnar nerve decompression   EPIDURAL BLOCK INJECTION     several   ULNAR NERVE TRANSPOSITION  01/19/2012   Procedure: ULNAR NERVE DECOMPRESSION/TRANSPOSITION;  Surgeon: Lamar LULLA Leonor Mickey., MD;  Location: [facility];  Service: Orthopedics;  Laterality: Left;  ulnar nerve decompression vs transposition left elbow   Family Psychiatric History:  Mother- Depression and PTSD, on Abilify Paternal Aunt- Paranoia, hoarding Nephew with autism  Family History:  Family History  Problem Relation Age of Onset   Diabetes Father    Diabetes Paternal Uncle    Colon cancer Neg Hx    Colon polyps Neg Hx    Esophageal cancer Neg Hx    Rectal cancer Neg Hx    Stomach cancer Neg Hx     Social History:  Academic/Vocational:  Housing: Living with mom Income: disability for  mental health Denies children, unclear relationship  Substance Use History:   Social History   Socioeconomic History   Marital status: Single    Spouse name: Not on file   Number of children: 0   Years of education: BA   Highest education level: Not on file  Occupational History   Occupation: Unemlpoyed-disabled  Tobacco Use   Smoking status: Never   Smokeless tobacco: Never  Vaping Use   Vaping status: Never Used  Substance and Sexual Activity   Alcohol use: No   Drug use: No   Sexual activity: Yes  Other Topics Concern   Not on file  Social History Narrative   Lives with mother    Caffeine use: Coke very rare   Right handed    Social Drivers of Health   Financial Resource Strain: Low Risk  (10/03/2022)   Overall Financial Resource Strain (CARDIA)    Difficulty of Paying Living Expenses: Not hard at all  Food Insecurity: No Food Insecurity (10/03/2022)   Hunger Vital Sign    Worried About Running Out of Food in the Last Year: Never true    Ran Out of Food in the Last Year: Never true  Transportation Needs: No Transportation Needs (10/03/2022)   PRAPARE - Administrator, Civil Service (Medical): No    Lack of Transportation (Non-Medical): No  Physical Activity: Insufficiently Active (10/03/2022)   Exercise Vital Sign    Days of Exercise per Week: 4 days    Minutes of Exercise per Session: 30 min  Stress: Stress Concern Present (10/03/2022)   [employer] of Occupational Health - Occupational Stress Questionnaire    Feeling of Stress : To some extent  Social Connections: Socially Isolated (09/21/2021)   Social Connection and Isolation Panel    Frequency of Communication with Friends and Family: Three times a week    Frequency of Social Gatherings with Friends and Family: Three times a week    Attends Religious Services: Never    Active Member of Clubs or Organizations: No    Attends Banker Meetings: Not on file    Marital Status: Never married    Allergies:  Allergies  Allergen Reactions   Cat Dander    Citric Acid Other (See Comments)    Runny nose, eyes water   Food     Oranges or anything with citric acid   Gabapentin      Walking into things, hard to maintain balance, falls    Current Medications: Current Outpatient Medications  Medication Sig Dispense Refill   ACCU-CHEK GUIDE TEST test strip USE TO TEST 3 TIMES DAILY(AM, NOON AND BEDTIME) 200 strip 3   ACCU-CHEK SOFTCLIX LANCETS lancets 3 (three) times daily. for testing  0   albuterol  (VENTOLIN  HFA) 108 (90 Base) MCG/ACT inhaler Inhale 1-2 puffs into the lungs every 6  (six) hours as needed for wheezing or shortness of breath. 18 g 0   amantadine  (SYMMETREL ) 100 MG capsule Take 1 capsule (100 mg total) by mouth 2 (two) times daily. 180 capsule 0   Blood Glucose Monitoring Suppl (ACCU-CHEK GUIDE ME) w/Device KIT USE AS DIRECTED 1 kit 0   Cariprazine  HCl (VRAYLAR ) 6 MG CAPS Take 1 capsule (6 mg total) by mouth daily. TAKE 1 CAPSULE(6 MG) BY MOUTH DAILY 90 capsule 0   chlorhexidine  (HIBICLENS ) 4 % external liquid Apply topically daily as needed. 118 mL 0   empagliflozin  (JARDIANCE ) 25 MG TABS tablet Take 1 tablet (25 mg total) by mouth  daily. 90 tablet 2   fexofenadine (ALLEGRA ODT) 30 MG disintegrating tablet Take 30 mg by mouth daily.     fluPHENAZine  (PROLIXIN ) 10 MG tablet Take 1 tablet (10 mg total) by mouth at bedtime. 90 tablet 0   fluticasone  (FLONASE ) 50 MCG/ACT nasal spray SHAKE LIQUID AND USE 2 SPRAYS IN EACH NOSTRIL DAILY 16 g 6   glipiZIDE  (GLUCOTROL ) 10 MG tablet TAKE 1 TABLET(10 MG) BY MOUTH TWICE DAILY 360 tablet 3   hydrOXYzine  (ATARAX ) 10 MG tablet Take 1 tablet (10 mg total) by mouth daily as needed. 90 tablet 0   metFORMIN  (GLUCOPHAGE -XR) 500 MG 24 hr tablet TAKE 2 TABLETS(1000 MG) BY MOUTH TWICE DAILY 360 tablet 3   montelukast  (SINGULAIR ) 10 MG tablet TAKE 1 TABLET(10 MG) BY MOUTH AT BEDTIME 90 tablet 2   mupirocin  ointment (BACTROBAN ) 2 % Apply 1 Application topically daily. 22 g 0   omeprazole  (PRILOSEC) 20 MG capsule TAKE 1 CAPSULE(20 MG) BY MOUTH DAILY 90 capsule 0   sertraline  (ZOLOFT ) 100 MG tablet Take 1.5 tablets (150 mg total) by mouth every morning. 135 tablet 0   simvastatin  (ZOCOR ) 20 MG tablet TAKE 1 TABLET(20 MG) BY MOUTH EVERY EVENING 90 tablet 3   tadalafil  (CIALIS ) 20 MG tablet TAKE ONE-HALF TO ONE TABLET BY MOUTH EVERY OTHER DAY AS NEEDED FOR ERECTILE DYSFUNCTION 10 tablet 2   traZODone  (DESYREL ) 50 MG tablet Take 1 tablet (50 mg total) by mouth at bedtime as needed for sleep. 90 tablet 0   TYRVAYA 0.03 MG/ACT SOLN Spray 1  spray in each nostril twice daily, approximately 12 hours apart     Current Facility-Administered Medications  Medication Dose Route Frequency Provider Last Rate Last Admin   methylPREDNISolone  acetate (DEPO-MEDROL ) injection 80 mg  80 mg Other Once Newton, Frederic, MD        ROS: Review of Systems  Constitutional: Negative.   Respiratory:  Negative for chest tightness and shortness of breath.   Cardiovascular:  Negative for chest pain.  Psychiatric/Behavioral:  Positive for decreased concentration. Negative for suicidal ideas. The patient is nervous/anxious.    Objective:  Psychiatric Specialty Exam: There were no vitals taken for this visit.There is no height or weight on file to calculate BMI.  General Appearance: Casual  Eye Contact:  Fair  Speech:  Clear and Coherent  Volume:  Normal  Mood:  Anxious  Affect:  Appropriate  Thought Content: Intermittent Paranoid Ideation   Suicidal Thoughts:  No  Homicidal Thoughts:  No  Thought Process:  Coherent  Orientation:  Full (Time, Place, and Person)    Memory: Grossly intact   Judgment:  Intact  Insight:  Fair  Concentration:  Concentration: Fair  Recall: not formally assessed   Fund of Knowledge: Fair  Language: Fair  Psychomotor Activity:  Normal   Akathisia:  Not observed during visit   AIMS (if indicated): 0 on 05/27/24  Assets:  Communication Skills Desire for Improvement Social Support  ADL's:  Intact  Cognition: WNL  Sleep:  Fair   PE: General: well-appearing; no acute distress  Pulm: no increased work of breathing on room air  Strength & Muscle Tone: within normal limits Neuro: no focal deficits noted Gait & Station: normal  Metabolic Disorder Labs: Lab Results  Component Value Date   HGBA1C 8.5 (H) 04/04/2024   MPG 123 03/02/2018   No results found for: PROLACTIN Lab Results  Component Value Date   CHOL 134 04/04/2024   TRIG 64.0 04/04/2024   HDL  60.20 04/04/2024   CHOLHDL 2 04/04/2024   VLDL  12.8 04/04/2024   LDLCALC 61 04/04/2024   LDLCALC 50 11/09/2022   Lab Results  Component Value Date   TSH 1.02 07/08/2019   TSH 0.92 03/15/2018    Therapeutic Level Labs: No results found for: LITHIUM No results found for: VALPROATE No results found for: CBMZ  Screenings:  GAD-7    Flowsheet Row Office Visit from 11/20/2023 in [facility] Visit from 07/26/2022 in [facility] [employer]  Total GAD-7 Score 5 5   PHQ2-9    Flowsheet Row Office Visit from 11/20/2023 in [facility] [place] [facility] Counselor from 03/08/2023 in [facility] [place] Counselor from 12/29/2022 in [facility] Visit from 11/17/2022 in [facility] [employer] Visit from 11/09/2022 in [facility] [employer]  PHQ-2 Total Score 0 0 0 0 0  PHQ-9 Total Score 5 -- -- -- --   Flowsheet Row UC from 03/31/2023 in [facility] UC from 03[facility]19/2023 in [facility] from 03/08/2023 in [facility] at [facility] RISK CATEGORY No Risk No Risk No Risk    Collaboration of Care: Collaboration of Care: Medication Management AEB attending MD  Patient/Guardian was advised Release of Information must be obtained prior to any record release in order to collaborate their care with an outside provider. Patient/Guardian was advised if they have not already done so to contact the registration department to sign all necessary forms in order for us  to release information regarding their care.   Consent: Patient/Guardian gives verbal consent for treatment and assignment of benefits for services provided during this visit. Patient/Guardian expressed understanding and agreed to proceed.   Corean Minor, MD,  PGY-3 07/01/2024, 11:06 AM

## 2024-07-01 ENCOUNTER — Ambulatory Visit (HOSPITAL_BASED_OUTPATIENT_CLINIC_OR_DEPARTMENT_OTHER): Admitting: Psychiatry

## 2024-07-01 ENCOUNTER — Encounter: Payer: Self-pay | Admitting: Radiology

## 2024-07-01 DIAGNOSIS — Z79899 Other long term (current) drug therapy: Secondary | ICD-10-CM | POA: Diagnosis not present

## 2024-07-01 DIAGNOSIS — F200 Paranoid schizophrenia: Secondary | ICD-10-CM

## 2024-07-01 DIAGNOSIS — F3342 Major depressive disorder, recurrent, in full remission: Secondary | ICD-10-CM

## 2024-07-01 DIAGNOSIS — G4700 Insomnia, unspecified: Secondary | ICD-10-CM

## 2024-07-01 MED ORDER — HYDROXYZINE HCL 10 MG PO TABS: 10.0000 mg | ORAL_TABLET | Freq: Every day | ORAL | 0 refills | Status: DC | PRN

## 2024-07-01 MED ORDER — FLUPHENAZINE HCL 10 MG PO TABS: 10.0000 mg | ORAL_TABLET | Freq: Every day | ORAL | 0 refills | Status: DC

## 2024-07-01 MED ORDER — AMANTADINE HCL 100 MG PO CAPS: 100.0000 mg | ORAL_CAPSULE | Freq: Two times a day (BID) | ORAL | 0 refills | Status: DC

## 2024-07-01 MED ORDER — VRAYLAR 6 MG PO CAPS: 6.0000 mg | ORAL_CAPSULE | Freq: Every day | ORAL | 0 refills | Status: DC

## 2024-07-01 MED ORDER — TRAZODONE HCL 50 MG PO TABS: 50.0000 mg | ORAL_TABLET | Freq: Every evening | ORAL | 0 refills | Status: AC | PRN

## 2024-07-04 ENCOUNTER — Ambulatory Visit: Admitting: Orthopedic Surgery

## 2024-07-08 ENCOUNTER — Other Ambulatory Visit: Payer: Self-pay | Admitting: Family Medicine

## 2024-07-08 DIAGNOSIS — N5201 Erectile dysfunction due to arterial insufficiency: Secondary | ICD-10-CM

## 2024-07-08 NOTE — Telephone Encounter (Signed)
 Tadalafil  refill request. LOV 04/04/2024 FOV 07/11/2024 Last refill 02/29/2024. Medication sent.

## 2024-07-11 ENCOUNTER — Ambulatory Visit: Admitting: Podiatry

## 2024-07-11 ENCOUNTER — Ambulatory Visit: Admitting: Family Medicine

## 2024-07-11 DIAGNOSIS — M722 Plantar fascial fibromatosis: Secondary | ICD-10-CM | POA: Diagnosis not present

## 2024-07-11 NOTE — Progress Notes (Signed)
 Along the arch on the right foot he said he said chronic plantar fasciitis for years sometimes better sometimes worse is trying get back into running again wants to know what he can do to prevent it from now.  He is of [employer] running shoe.   Physical exam:  General appearance: Pleasant, and in no acute distress. AOx3.  Vascular: Pedal pulses: DP 2/4 bilaterally, PT 2/4 bilaterally.  Minimal edema lower legs bilaterally. Capillary fill time immediate bilaterally.  Neurological: Light touch intact feet bilaterally.  Normal Achilles reflex bilaterally.  No clonus or spasticity noted.  Negative Tinel sign tarsal tunnel and porta pedis bilaterally  Dermatologic:   Skin normal temperature bilaterally.  Skin normal color, tone, and texture bilaterally.   Musculoskeletal: Tenderness on middle one third of plantar fascia on the right.  No fibromas palpable.  No tenderness around the calcaneus.   Diagnosis: 1.  Plantar fasciitis right  Plan: -Established office visit for evaluation and management level 3. - Discussed with the chronic plantar fasciitis and etiology and treatment.  Discussed proper running shoes.  Discussed exercises he can do, will give him written instructions on stretching and at home exercises he can do.  Recommended OTC diclofenac  gel as needed.  Ice and/or heat as needed.  Return as needed

## 2024-07-11 NOTE — Progress Notes (Signed)
 SABRA

## 2024-07-11 NOTE — Patient Instructions (Signed)

## 2024-07-12 ENCOUNTER — Ambulatory Visit: Admitting: Podiatry

## 2024-07-15 ENCOUNTER — Ambulatory Visit (INDEPENDENT_AMBULATORY_CARE_PROVIDER_SITE_OTHER): Admitting: Orthopedic Surgery

## 2024-07-15 DIAGNOSIS — M545 Low back pain, unspecified: Secondary | ICD-10-CM
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
## 2024-08-09 ENCOUNTER — Telehealth: Payer: Self-pay

## 2024-08-09 NOTE — Telephone Encounter (Signed)
 Patient was identified as falling into the [place] Measure - Diabetes.   Patient was: Appointment already scheduled for:  08/23/2024.

## 2024-08-12 ENCOUNTER — Other Ambulatory Visit: Payer: Self-pay | Admitting: Family Medicine

## 2024-08-12 DIAGNOSIS — E119 Type 2 diabetes mellitus without complications: Secondary | ICD-10-CM

## 2024-08-23 ENCOUNTER — Ambulatory Visit: Admitting: Family Medicine

## 2024-08-23 ENCOUNTER — Encounter: Payer: Self-pay | Admitting: Family Medicine

## 2024-08-23 VITALS — BP 118/75 | HR 77 | Temp 97.4°F | Ht 69.0 in | Wt 186.4 lb

## 2024-08-23 DIAGNOSIS — F200 Paranoid schizophrenia: Secondary | ICD-10-CM

## 2024-08-23 DIAGNOSIS — R209 Unspecified disturbances of skin sensation: Secondary | ICD-10-CM | POA: Diagnosis not present

## 2024-08-23 DIAGNOSIS — E1165 Type 2 diabetes mellitus with hyperglycemia: Secondary | ICD-10-CM

## 2024-08-23 DIAGNOSIS — R4582 Worries: Secondary | ICD-10-CM | POA: Diagnosis not present

## 2024-08-23 NOTE — Progress Notes (Signed)
 "  Established Patient Office Visit   Subjective:  Patient ID: Glenn Robertson, male    DOB: July 31, 1966  Age: 58 y.o. MRN: [id number]  Chief Complaint  Patient presents with   Medication Refill    Pt presents today for a Med Refill.   Cold Extremity    Pt states he also has cold extremity. Pt is always cold. State his legs are mostly cold and believes it may have something to do with his diabetes      Medication Refill Pertinent negatives include no abdominal pain, myalgias, rash or weakness.   Encounter Diagnoses  Name Primary?   Bilateral cold feet Yes   Uncontrolled type 2 diabetes mellitus with hyperglycemia (HCC)    Worries    Paranoid schizophrenia, chronic condition (HCC)    For follow-up of uncontrolled diabetes.  He claims compliance with his glipizide , metformin  and empagliflozin .  His fasting sugars have been in the less than 140 range.  Restarted exercising recently after ongoing plantar fasciitis has resolved.  He has improved his diet and cut out much of the simple carbohydrates that he has been consuming.  Feet and hands are often cold.  B12  levels have been normal.  He worries that is secondary to his diabetes   Review of Systems  Constitutional: Negative.   HENT: Negative.    Eyes:  Negative for blurred vision, discharge and redness.  Respiratory: Negative.    Cardiovascular: Negative.   Gastrointestinal:  Negative for abdominal pain.  Genitourinary: Negative.   Musculoskeletal: Negative.  Negative for myalgias.  Skin:  Negative for rash.  Neurological:  Negative for tingling, loss of consciousness and weakness.  Endo/Heme/Allergies:  Negative for polydipsia.    Current Medications[1]   Objective:     BP 118/75   Pulse 77   Temp (!) 97.4 F (36.3 C)   Ht 5' 9 (1.753 m)   Wt 186 lb 6.4 oz (84.6 kg)   SpO2 100%   BMI 27.53 kg/m    Physical Exam Constitutional:      General: He is not in acute distress.    Appearance: Normal appearance. He is  not ill-appearing, toxic-appearing or diaphoretic.  HENT:     Head: Normocephalic and atraumatic.     Right Ear: External ear normal.     Left Ear: External ear normal.  Eyes:     General: No scleral icterus.       Right eye: No discharge.        Left eye: No discharge.     Extraocular Movements: Extraocular movements intact.     Conjunctiva/sclera: Conjunctivae normal.  Cardiovascular:     Rate and Rhythm: Normal rate and regular rhythm.     Pulses:          Radial pulses are 2+ on the right side and 2+ on the left side.       Dorsalis pedis pulses are 2+ on the right side and 2+ on the left side.       Posterior tibial pulses are 1+ on the right side and 1+ on the left side.  Pulmonary:     Effort: Pulmonary effort is normal. No respiratory distress.     Breath sounds: No wheezing or rales.  Skin:    General: Skin is warm and dry.  Neurological:     Mental Status: He is alert and oriented to person, place, and time.  Psychiatric:        Mood and Affect: Mood normal.  Behavior: Behavior normal.    Diabetic Foot Exam - Simple   Simple Foot Form Diabetic Foot exam was performed with the following findings: Yes 08/23/2024  9:48 AM  Visual Inspection See comments: Yes Sensation Testing See comments: Yes Pulse Check Posterior Tibialis and Dorsalis pulse intact bilaterally: Yes Comments Both feet have relaxed arches.  There are no lesions or rashes.  Reports some paresthesias in the balls of his feet but sensory is intact to monofilament line testing.       No results found for any visits on 08/23/24.    The 10-year ASCVD risk score (Arnett DK, et al., 2019) is: 9.9%    Assessment & Plan:   Bilateral cold feet -     Vitamin B12  Uncontrolled type 2 diabetes mellitus with hyperglycemia (HCC) -     Basic metabolic panel with GFR -     Hemoglobin A1c  Worries -     TSH  Paranoid schizophrenia, chronic condition (HCC)    Return in about 3 months (around  11/21/2024).  Continue all current medications.  Discussed adding a medication for further control of his diabetes.  He would like to hold off for now and assures me that he will exercise diligently and continue to moderate carbohydrate intake.  Elsie Sim Lent, MD     [1]  Current Outpatient Medications:    ACCU-CHEK GUIDE TEST test strip, USE TO TEST 3 TIMES DAILY(AM, NOON AND BEDTIME), Disp: 200 strip, Rfl: 3   ACCU-CHEK SOFTCLIX LANCETS lancets, 3 (three) times daily. for testing, Disp: , Rfl: 0   albuterol  (VENTOLIN  HFA) 108 (90 Base) MCG/ACT inhaler, Inhale 1-2 puffs into the lungs every 6 (six) hours as needed for wheezing or shortness of breath., Disp: 18 g, Rfl: 0   amantadine  (SYMMETREL ) 100 MG capsule, Take 1 capsule (100 mg total) by mouth 2 (two) times daily., Disp: 180 capsule, Rfl: 0   Blood Glucose Monitoring Suppl (ACCU-CHEK GUIDE ME) w/Device KIT, USE AS DIRECTED, Disp: 1 kit, Rfl: 0   Cariprazine  HCl (VRAYLAR ) 6 MG CAPS, Take 1 capsule (6 mg total) by mouth daily. TAKE 1 CAPSULE(6 MG) BY MOUTH DAILY, Disp: 90 capsule, Rfl: 0   fexofenadine (ALLEGRA ODT) 30 MG disintegrating tablet, Take 30 mg by mouth daily., Disp: , Rfl:    fluPHENAZine  (PROLIXIN ) 10 MG tablet, Take 1 tablet (10 mg total) by mouth at bedtime., Disp: 90 tablet, Rfl: 0   fluticasone  (FLONASE ) 50 MCG/ACT nasal spray, SHAKE LIQUID AND USE 2 SPRAYS IN EACH NOSTRIL DAILY, Disp: 16 g, Rfl: 6   glipiZIDE  (GLUCOTROL ) 10 MG tablet, TAKE 1 TABLET(10 MG) BY MOUTH TWICE DAILY, Disp: 360 tablet, Rfl: 3   hydrOXYzine  (ATARAX ) 10 MG tablet, Take 1 tablet (10 mg total) by mouth daily as needed., Disp: 90 tablet, Rfl: 0   JARDIANCE  25 MG TABS tablet, TAKE 1 TABLET(25 MG) BY MOUTH DAILY, Disp: 90 tablet, Rfl: 2   metFORMIN  (GLUCOPHAGE -XR) 500 MG 24 hr tablet, TAKE 2 TABLETS(1000 MG) BY MOUTH TWICE DAILY, Disp: 360 tablet, Rfl: 3   montelukast  (SINGULAIR ) 10 MG tablet, TAKE 1 TABLET(10 MG) BY MOUTH AT BEDTIME, Disp: 90  tablet, Rfl: 2   mupirocin  ointment (BACTROBAN ) 2 %, Apply 1 Application topically daily., Disp: 22 g, Rfl: 0   omeprazole  (PRILOSEC) 20 MG capsule, TAKE 1 CAPSULE(20 MG) BY MOUTH DAILY, Disp: 90 capsule, Rfl: 0   sertraline  (ZOLOFT ) 100 MG tablet, Take 1.5 tablets (150 mg total) by mouth every morning., Disp: 135  tablet, Rfl: 0   simvastatin  (ZOCOR ) 20 MG tablet, TAKE 1 TABLET(20 MG) BY MOUTH EVERY EVENING, Disp: 90 tablet, Rfl: 3   tadalafil  (CIALIS ) 20 MG tablet, TAKE ONE-HALF TO ONE TABLET BY MOUTH EVERY OTHER DAY AS NEEDED FOR ERECTILE DYSFUNCTION, Disp: 10 tablet, Rfl: 2   traZODone  (DESYREL ) 50 MG tablet, Take 1 tablet (50 mg total) by mouth at bedtime as needed for sleep., Disp: 90 tablet, Rfl: 0   TYRVAYA 0.03 MG/ACT SOLN, Spray 1 spray in each nostril twice daily, approximately 12 hours apart, Disp: , Rfl:    chlorhexidine  (HIBICLENS ) 4 % external liquid, Apply topically daily as needed. (Patient not taking: Reported on 08/23/2024), Disp: 118 mL, Rfl: 0  Current Facility-Administered Medications:    methylPREDNISolone  acetate (DEPO-MEDROL ) injection 80 mg, 80 mg, Other, Once, Eldonna Novel, MD  "

## 2024-08-26 ENCOUNTER — Encounter: Payer: Self-pay | Admitting: Family Medicine

## 2024-08-27 ENCOUNTER — Other Ambulatory Visit

## 2024-08-27 ENCOUNTER — Telehealth: Payer: Self-pay | Admitting: Family Medicine

## 2024-08-27 ENCOUNTER — Other Ambulatory Visit (INDEPENDENT_AMBULATORY_CARE_PROVIDER_SITE_OTHER)

## 2024-08-27 DIAGNOSIS — R209 Unspecified disturbances of skin sensation: Secondary | ICD-10-CM

## 2024-08-27 LAB — BASIC METABOLIC PANEL WITH GFR
BUN: 15 mg/dL (ref 6–23)
CO2: 25 meq/L (ref 19–32)
Calcium: 8.9 mg/dL (ref 8.4–10.5)
Chloride: 104 meq/L (ref 96–112)
Creatinine, Ser: 0.82 mg/dL (ref 0.40–1.50)
GFR: 97.19 mL/min
Glucose, Bld: 86 mg/dL (ref 70–99)
Potassium: 3.5 meq/L (ref 3.5–5.1)
Sodium: 137 meq/L (ref 135–145)

## 2024-08-27 LAB — TSH: TSH: 0.94 u[IU]/mL (ref 0.35–5.50)

## 2024-08-27 LAB — HEMOGLOBIN A1C: Hgb A1c MFr Bld: 7.5 % — ABNORMAL HIGH (ref 4.6–6.5)

## 2024-08-27 LAB — VITAMIN B12: Vitamin B-12: 1202 pg/mL — ABNORMAL HIGH (ref 211–911)

## 2024-08-27 NOTE — Telephone Encounter (Signed)
 Copied from CRM #[id number]. Topic: General - Other >> Aug 27, 2024  3:48 PM Alexandria E wrote: Reason for CRM: Patient called in stating that [facility] faxed over a form for PCP to fill out regarding patients Accu-check guide test strips. Patient is not sure what this form is exactly.

## 2024-08-27 NOTE — Addendum Note (Signed)
 Addended by: BERNETA ELSIE LABOR on: 08/27/2024 09:44 AM   Modules accepted: Orders

## 2024-08-30 ENCOUNTER — Ambulatory Visit: Payer: Self-pay | Admitting: Family Medicine

## 2024-08-30 NOTE — Progress Notes (Signed)
 Hi Linda,  I am not managing the paranoid schizophrenia.  I simply entered into the medical record because I think it is important to note it because it helps to explain how difficult caring for him can be.

## 2024-09-02 NOTE — Telephone Encounter (Signed)
 I just did paper refills and did not see this. Unsure if this has been completed.

## 2024-09-04 ENCOUNTER — Telehealth: Payer: Self-pay

## 2024-09-04 ENCOUNTER — Other Ambulatory Visit (HOSPITAL_COMMUNITY): Payer: Self-pay

## 2024-09-04 NOTE — Telephone Encounter (Unsigned)
 Copied from CRM [id number]. Topic: Clinical - Medication Prior Auth >> Sep 04, 2024  9:31 AM Thersia BROCKS wrote: Reason for CRM: Patient called in regarding ACCU-CHEK GUIDE TEST test strip stated pharmacy is requiring prior authorization for his test strips will be out soon

## 2024-09-04 NOTE — Telephone Encounter (Signed)
 Pharmacy Patient Advocate Encounter   Received notification from Physician's Office that prior authorization for ACCU CHECK is required/requested.   Insurance verification completed.   The patient is insured through [employer].   Per test claim: The current 50 day co-pay is, $0.  No PA needed at this time. This test claim was processed through [facility]- copay amounts may vary at other pharmacies due to pharmacy/plan contracts, or as the patient moves through the different stages of their insurance plan.    Plan limits to 5.6 qty/day. Ran 150 strips for 50 days, copay is $0

## 2024-09-07 ENCOUNTER — Other Ambulatory Visit: Payer: Self-pay | Admitting: Family Medicine

## 2024-09-07 DIAGNOSIS — E119 Type 2 diabetes mellitus without complications: Secondary | ICD-10-CM
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
## 2024-09-09 ENCOUNTER — Ambulatory Visit (HOSPITAL_COMMUNITY): Admitting: Psychiatry

## 2024-09-09 DIAGNOSIS — G4700 Insomnia, unspecified: Secondary | ICD-10-CM

## 2024-09-09 DIAGNOSIS — Z79899 Other long term (current) drug therapy: Secondary | ICD-10-CM

## 2024-09-09 DIAGNOSIS — F3342 Major depressive disorder, recurrent, in full remission: Secondary | ICD-10-CM | POA: Diagnosis not present

## 2024-09-09 DIAGNOSIS — F200 Paranoid schizophrenia: Secondary | ICD-10-CM | POA: Diagnosis not present

## 2024-09-09 MED ORDER — HYDROXYZINE HCL 10 MG PO TABS: 10.0000 mg | ORAL_TABLET | Freq: Every day | ORAL | 0 refills | Status: AC | PRN

## 2024-09-09 MED ORDER — AMANTADINE HCL 100 MG PO CAPS: 100.0000 mg | ORAL_CAPSULE | Freq: Two times a day (BID) | ORAL | 0 refills | Status: AC

## 2024-09-09 MED ORDER — FLUPHENAZINE HCL 10 MG PO TABS: 10.0000 mg | ORAL_TABLET | Freq: Every day | ORAL | 0 refills | Status: AC

## 2024-09-09 MED ORDER — VRAYLAR 6 MG PO CAPS: 6.0000 mg | ORAL_CAPSULE | Freq: Every day | ORAL | 0 refills | Status: AC

## 2024-09-09 MED ORDER — SERTRALINE HCL 100 MG PO TABS: 150.0000 mg | ORAL_TABLET | Freq: Every morning | ORAL | 0 refills | Status: AC

## 2024-09-09 NOTE — Addendum Note (Signed)
 Addended by: CARVIN CROCK on: 09/09/2024 02:09 PM   Modules accepted: Level of Service

## 2024-09-10 NOTE — Telephone Encounter (Unsigned)
 Copied from CRM [id number]. Topic: Clinical - Medication Prior Auth >> Sep 10, 2024 12:13 PM Charolett L wrote: Reason for CRM: Patient called in and stated that the item listed ACCU-CHEK GUIDE TEST test strip  is requiring prior authorization from the pharmacy and that it has been over a month since he received anything and he's completely out. Patient stated that forms are needed to be sent to pharmacy  Patient wants strips sent to [id number] [address], [place] - [address] [place] [address] Phone: [phone] Fax: [phone]

## 2024-09-11 ENCOUNTER — Other Ambulatory Visit: Payer: Self-pay | Admitting: Family Medicine

## 2024-09-11 DIAGNOSIS — N5201 Erectile dysfunction due to arterial insufficiency: Secondary | ICD-10-CM

## 2024-09-12 ENCOUNTER — Other Ambulatory Visit: Payer: Self-pay | Admitting: Family Medicine

## 2024-09-12 DIAGNOSIS — R052 Subacute cough: Secondary | ICD-10-CM

## 2024-10-21 ENCOUNTER — Ambulatory Visit: Admitting: Family Medicine

## 2024-11-05 ENCOUNTER — Ambulatory Visit

## 2024-11-20 ENCOUNTER — Ambulatory Visit (HOSPITAL_COMMUNITY): Admitting: Psychiatry
